# Patient Record
Sex: Male | Born: 1950 | Race: White | Hispanic: No | Smoking: Former smoker
Health system: Southern US, Community
[De-identification: ages and names within clinical notes are randomized; demographics above are authoritative.]

## PROBLEM LIST (undated history)

## (undated) DIAGNOSIS — I4892 Unspecified atrial flutter: Secondary | ICD-10-CM

## (undated) DIAGNOSIS — I10 Essential (primary) hypertension: Secondary | ICD-10-CM

## (undated) DIAGNOSIS — C801 Malignant (primary) neoplasm, unspecified: Secondary | ICD-10-CM

## (undated) DIAGNOSIS — N401 Enlarged prostate with lower urinary tract symptoms: Secondary | ICD-10-CM

## (undated) DIAGNOSIS — E7800 Pure hypercholesterolemia, unspecified: Secondary | ICD-10-CM

## (undated) DIAGNOSIS — M1990 Unspecified osteoarthritis, unspecified site: Secondary | ICD-10-CM

## (undated) DIAGNOSIS — N189 Chronic kidney disease, unspecified: Secondary | ICD-10-CM

## (undated) DIAGNOSIS — R338 Other retention of urine: Secondary | ICD-10-CM

## (undated) DIAGNOSIS — J449 Chronic obstructive pulmonary disease, unspecified: Secondary | ICD-10-CM

## (undated) DIAGNOSIS — C3491 Malignant neoplasm of unspecified part of right bronchus or lung: Secondary | ICD-10-CM

## (undated) HISTORY — PX: INCISION AND DRAINAGE ABSCESS: SHX5864

## (undated) HISTORY — PX: HERNIA REPAIR: SHX51

---

## 2014-10-14 ENCOUNTER — Other Ambulatory Visit: Payer: Self-pay | Admitting: Orthopedic Surgery

## 2014-10-20 NOTE — Pre-Procedure Instructions (Signed)
Aaron Sellers  10/20/2014      [facility] - [place], [place] - [place] [address] Phone: [phone] Fax: [phone]    Your procedure is scheduled on Friday, October 29, 2014  Report to [facility] Admitting at 9:00 A.M.  Call this number if you have problems the morning of surgery:  [phone]   Remember:  Do not eat food or drink liquids after midnight Thursday, October 28, 2014  Take these medicines the morning of surgery with A SIP OF WATER :  amLODipine (NORVASC), tamsulosin [facility]) Stop taking Aspirin, vitamins and herbal medications. Do not take any NSAIDs ie: Ibuprofen, Advil, Naproxen or any medication containing Aspirin; stop 1 week prior to procedure ( Friday, October 22, 2014)  Do not wear jewelry, make-up or nail polish.  Do not wear lotions, powders, or perfumes.  You may not wear deodorant.  Do not shave 48 hours prior to surgery.  Men may shave face and neck.  Do not bring valuables to the hospital.  [facility] is not responsible for any belongings or valuables.  Contacts, dentures or bridgework may not be worn into surgery.  Leave your suitcase in the car.  After surgery it may be brought to your room.  For patients admitted to the hospital, discharge time will be determined by your treatment team.  Patients discharged the day of surgery will not be allowed to drive home.   Name and phone number of your driver:  Special instructions:  Special Instructions:Special Instructions: [facility] - Preparing for Surgery  Before surgery, you can play an important role.  Because skin is not sterile, your skin needs to be as free of germs as possible.  You can reduce the number of germs on you skin by washing with CHG (chlorahexidine gluconate) soap before surgery.  CHG is an antiseptic cleaner which kills germs and bonds with the skin to continue killing germs even after washing.  Please DO NOT use if  you have an allergy to CHG or antibacterial soaps.  If your skin becomes reddened/irritated stop using the CHG and inform your nurse when you arrive at Short Stay.  Do not shave (including legs and underarms) for at least 48 hours prior to the first CHG shower.  You may shave your face.  Please follow these instructions carefully:   1.  Shower with CHG Soap the night before surgery and the morning of Surgery.  2.  If you choose to wash your hair, wash your hair first as usual with your normal shampoo.  3.  After you shampoo, rinse your hair and body thoroughly to remove the Shampoo.  4.  Use CHG as you would any other liquid soap.  You can apply chg directly  to the skin and wash gently with scrungie or a clean washcloth.  5.  Apply the CHG Soap to your body ONLY FROM THE NECK DOWN.  Do not use on open wounds or open sores.  Avoid contact with your eyes, ears, mouth and genitals (private parts).  Wash genitals (private parts) with your normal soap.  6.  Wash thoroughly, paying special attention to the area where your surgery will be performed.  7.  Thoroughly rinse your body with warm water from the neck down.  8.  DO NOT shower/wash with your normal soap after using and rinsing off the CHG Soap.  9.  Pat yourself dry with a clean towel.  10.  Wear clean pajamas.            11.  Place clean sheets on your bed the night of your first shower and do not sleep with pets.  Day of Surgery  Do not apply any lotions/deodorants the morning of surgery.  Please wear clean clothes to the hospital/surgery center. Please read over the following fact sheets that you were given. Pain Booklet, Coughing and Deep Breathing, Blood Transfusion Information, Total Joint Packet, MRSA Information and Surgical Site Infection Prevention

## 2014-10-21 ENCOUNTER — Encounter (HOSPITAL_COMMUNITY): Payer: Self-pay

## 2014-10-21 ENCOUNTER — Ambulatory Visit (HOSPITAL_COMMUNITY)
Admission: RE | Admit: 2014-10-21 | Discharge: 2014-10-21 | Disposition: A | Discharge status: Home / Self Care | Payer: Managed Care, Other (non HMO) | Source: Ambulatory Visit | Attending: Orthopedic Surgery | Admitting: Orthopedic Surgery

## 2014-10-21 ENCOUNTER — Encounter (HOSPITAL_COMMUNITY)
Admission: RE | Admit: 2014-10-21 | Discharge: 2014-10-21 | Disposition: A | Discharge status: Home / Self Care | Payer: Managed Care, Other (non HMO) | Source: Ambulatory Visit | Attending: Orthopedic Surgery | Admitting: Orthopedic Surgery

## 2014-10-21 DIAGNOSIS — Z01818 Encounter for other preprocedural examination: Secondary | ICD-10-CM | POA: Diagnosis present

## 2014-10-21 HISTORY — DX: Other retention of urine: R33.8

## 2014-10-21 HISTORY — DX: Unspecified osteoarthritis, unspecified site: M19.90

## 2014-10-21 HISTORY — DX: Chronic obstructive pulmonary disease, unspecified: J44.9

## 2014-10-21 HISTORY — DX: Chronic kidney disease, unspecified: N18.9

## 2014-10-21 HISTORY — DX: Pure hypercholesterolemia, unspecified: E78.00

## 2014-10-21 HISTORY — DX: Essential (primary) hypertension: I10

## 2014-10-21 HISTORY — DX: Benign prostatic hyperplasia with lower urinary tract symptoms: N40.1

## 2014-10-21 LAB — CBC WITH DIFFERENTIAL/PLATELET
Basophils Absolute: 0.1 10*3/uL (ref 0.0–0.1)
Basophils Relative: 1 % (ref 0–1)
Eosinophils Absolute: 0.2 10*3/uL (ref 0.0–0.7)
Eosinophils Relative: 1 % (ref 0–5)
HCT: 52.1 % — ABNORMAL HIGH (ref 39.0–52.0)
HEMOGLOBIN: 17.8 g/dL — AB (ref 13.0–17.0)
Lymphocytes Relative: 22 % (ref 12–46)
Lymphs Abs: 2.4 10*3/uL (ref 0.7–4.0)
MCH: 29.6 pg (ref 26.0–34.0)
MCHC: 34.2 g/dL (ref 30.0–36.0)
MCV: 86.5 fL (ref 78.0–100.0)
Monocytes Absolute: 1 10*3/uL (ref 0.1–1.0)
Monocytes Relative: 9 % (ref 3–12)
NEUTROS ABS: 7.3 10*3/uL (ref 1.7–7.7)
Neutrophils Relative %: 67 % (ref 43–77)
Platelets: 218 10*3/uL (ref 150–400)
RBC: 6.02 MIL/uL — ABNORMAL HIGH (ref 4.22–5.81)
RDW: 13.4 % (ref 11.5–15.5)
WBC: 11 10*3/uL — ABNORMAL HIGH (ref 4.0–10.5)

## 2014-10-21 LAB — COMPREHENSIVE METABOLIC PANEL
ALT: 39 U/L (ref 17–63)
ANION GAP: 9 (ref 5–15)
AST: 28 U/L (ref 15–41)
Albumin: 4.1 g/dL (ref 3.5–5.0)
Alkaline Phosphatase: 76 U/L (ref 38–126)
BUN: 11 mg/dL (ref 6–20)
CO2: 30 mmol/L (ref 22–32)
Calcium: 10 mg/dL (ref 8.9–10.3)
Chloride: 101 mmol/L (ref 101–111)
Creatinine, Ser: 1.08 mg/dL (ref 0.61–1.24)
GFR calc Af Amer: >60 mL/min (ref >60)
GFR calc non Af Amer: >60 mL/min (ref >60)
GLUCOSE: 104 mg/dL — AB (ref 65–99)
Potassium: 4.4 mmol/L (ref 3.5–5.1)
Sodium: 140 mmol/L (ref 135–145)
Total Bilirubin: 1.1 mg/dL (ref 0.3–1.2)
Total Protein: 7.6 g/dL (ref 6.5–8.1)

## 2014-10-21 LAB — URINALYSIS, ROUTINE W REFLEX MICROSCOPIC
Bilirubin Urine: NEGATIVE
Glucose, UA: NEGATIVE mg/dL
Hgb urine dipstick: NEGATIVE
KETONES UR: NEGATIVE mg/dL
Leukocytes, UA: NEGATIVE
NITRITE: NEGATIVE
PROTEIN: NEGATIVE mg/dL
Specific Gravity, Urine: 1.019 (ref 1.005–1.030)
Urobilinogen, UA: 1 mg/dL (ref 0.0–1.0)
pH: 7.5 (ref 5.0–8.0)

## 2014-10-21 LAB — APTT: aPTT: 29 seconds (ref 24–37)

## 2014-10-21 LAB — SURGICAL PCR SCREEN
MRSA, PCR: NEGATIVE
STAPHYLOCOCCUS AUREUS: NEGATIVE

## 2014-10-21 LAB — PROTIME-INR
INR: 1.01 (ref 0.00–1.49)
Prothrombin Time: 13.5 seconds (ref 11.6–15.2)

## 2014-10-21 LAB — TYPE AND SCREEN
ABO/RH(D): O POS
Antibody Screen: NEGATIVE

## 2014-10-21 LAB — ABO/RH: ABO/RH(D): O POS

## 2014-10-21 NOTE — Progress Notes (Signed)
Denies any heart problems or issues.  Has never seen cardio.  Smoker, who coughs until he gags, mainly in the AM. HOH PCP  Dr Dione Housekeeper [facility]   [address] in past 6 mths.

## 2014-10-28 MED ORDER — DEXTROSE 5 % IV SOLN
3.0000 g | INTRAVENOUS | Status: DC
  Filled 2014-10-28 (×2): qty 3000

## 2014-10-28 NOTE — Progress Notes (Signed)
Pt notified of time change.New arrival time of 0815.

## 2014-10-29 ENCOUNTER — Inpatient Hospital Stay (HOSPITAL_COMMUNITY): Payer: Managed Care, Other (non HMO) | Admitting: Certified Registered Nurse Anesthetist

## 2014-10-29 ENCOUNTER — Encounter (HOSPITAL_COMMUNITY)
Admission: RE | Disposition: A | Discharge status: Home Health | Payer: Self-pay | Source: Ambulatory Visit | Attending: Orthopedic Surgery

## 2014-10-29 ENCOUNTER — Encounter (HOSPITAL_COMMUNITY): Payer: Self-pay | Admitting: Certified Registered Nurse Anesthetist

## 2014-10-29 ENCOUNTER — Inpatient Hospital Stay (HOSPITAL_COMMUNITY)
Admission: RE | Admit: 2014-10-29 | Discharge: 2014-10-31 | DRG: 470 | Disposition: A | Discharge status: Home Health | Payer: Managed Care, Other (non HMO) | Source: Ambulatory Visit | Attending: Orthopedic Surgery | Admitting: Orthopedic Surgery

## 2014-10-29 DIAGNOSIS — M25562 Pain in left knee: Secondary | ICD-10-CM | POA: Diagnosis present

## 2014-10-29 DIAGNOSIS — M1710 Unilateral primary osteoarthritis, unspecified knee: Secondary | ICD-10-CM | POA: Diagnosis present

## 2014-10-29 DIAGNOSIS — N189 Chronic kidney disease, unspecified: Secondary | ICD-10-CM | POA: Diagnosis present

## 2014-10-29 DIAGNOSIS — M1712 Unilateral primary osteoarthritis, left knee: Principal | ICD-10-CM | POA: Diagnosis present

## 2014-10-29 DIAGNOSIS — J449 Chronic obstructive pulmonary disease, unspecified: Secondary | ICD-10-CM | POA: Diagnosis present

## 2014-10-29 DIAGNOSIS — F17210 Nicotine dependence, cigarettes, uncomplicated: Secondary | ICD-10-CM | POA: Diagnosis present

## 2014-10-29 DIAGNOSIS — Z79899 Other long term (current) drug therapy: Secondary | ICD-10-CM

## 2014-10-29 DIAGNOSIS — R338 Other retention of urine: Secondary | ICD-10-CM | POA: Diagnosis present

## 2014-10-29 DIAGNOSIS — N401 Enlarged prostate with lower urinary tract symptoms: Secondary | ICD-10-CM | POA: Diagnosis present

## 2014-10-29 DIAGNOSIS — E780 Pure hypercholesterolemia: Secondary | ICD-10-CM | POA: Diagnosis present

## 2014-10-29 DIAGNOSIS — I129 Hypertensive chronic kidney disease with stage 1 through stage 4 chronic kidney disease, or unspecified chronic kidney disease: Secondary | ICD-10-CM | POA: Diagnosis present

## 2014-10-29 DIAGNOSIS — M179 Osteoarthritis of knee, unspecified: Secondary | ICD-10-CM | POA: Diagnosis present

## 2014-10-29 HISTORY — PX: TOTAL KNEE ARTHROPLASTY: SHX125

## 2014-10-29 SURGERY — ARTHROPLASTY, KNEE, TOTAL
Anesthesia: Monitor Anesthesia Care | Site: Knee | Laterality: Left

## 2014-10-29 MED ORDER — ZOLPIDEM TARTRATE 5 MG PO TABS: 5.0000 mg | ORAL_TABLET | Freq: Every evening | ORAL | Status: DC | PRN

## 2014-10-29 MED ORDER — TRAMADOL HCL 50 MG PO TABS
50.0000 mg | ORAL_TABLET | Freq: Four times a day (QID) | ORAL | Status: DC | PRN
  Administered 2014-10-29 – 2014-10-31 (×4): 50 mg via ORAL
  Filled 2014-10-29 (×4): qty 1

## 2014-10-29 MED ORDER — METHOCARBAMOL 500 MG PO TABS
500.0000 mg | ORAL_TABLET | Freq: Four times a day (QID) | ORAL | Status: DC | PRN
  Administered 2014-10-29 – 2014-10-31 (×6): 500 mg via ORAL
  Filled 2014-10-29 (×6): qty 1

## 2014-10-29 MED ORDER — KETAMINE HCL 100 MG/ML IJ SOLN
INTRAMUSCULAR | Status: AC
  Filled 2014-10-29: qty 1

## 2014-10-29 MED ORDER — MENTHOL 3 MG MT LOZG: 1.0000 | LOZENGE | OROMUCOSAL | Status: DC | PRN

## 2014-10-29 MED ORDER — ALUM & MAG HYDROXIDE-SIMETH 200-200-20 MG/5ML PO SUSP: 30.0000 mL | ORAL | Status: DC | PRN

## 2014-10-29 MED ORDER — METOCLOPRAMIDE HCL 5 MG/ML IJ SOLN: 5.0000 mg | Freq: Three times a day (TID) | INTRAMUSCULAR | Status: DC | PRN

## 2014-10-29 MED ORDER — SODIUM CHLORIDE 0.9 % IJ SOLN
INTRAMUSCULAR | Status: AC
  Filled 2014-10-29: qty 10

## 2014-10-29 MED ORDER — ATORVASTATIN CALCIUM 10 MG PO TABS
10.0000 mg | ORAL_TABLET | Freq: Every day | ORAL | Status: DC
  Administered 2014-10-29 – 2014-10-30 (×2): 10 mg via ORAL
  Filled 2014-10-29 (×3): qty 1

## 2014-10-29 MED ORDER — CEFAZOLIN SODIUM-DEXTROSE 2-3 GM-% IV SOLR
2.0000 g | Freq: Four times a day (QID) | INTRAVENOUS | Status: AC
  Administered 2014-10-29: 2 g via INTRAVENOUS
  Filled 2014-10-29 (×3): qty 50

## 2014-10-29 MED ORDER — EPHEDRINE SULFATE 50 MG/ML IJ SOLN
INTRAMUSCULAR | Status: DC | PRN
  Administered 2014-10-29 (×3): 5 mg via INTRAVENOUS
  Administered 2014-10-29: 10 mg via INTRAVENOUS

## 2014-10-29 MED ORDER — SODIUM CHLORIDE 0.9 % IV SOLN
INTRAVENOUS | Status: DC
  Administered 2014-10-29: 75 mL/h via INTRAVENOUS

## 2014-10-29 MED ORDER — ASPIRIN EC 325 MG PO TBEC
325.0000 mg | DELAYED_RELEASE_TABLET | Freq: Two times a day (BID) | ORAL | Status: DC
  Administered 2014-10-29 – 2014-10-31 (×4): 325 mg via ORAL
  Filled 2014-10-29 (×4): qty 1

## 2014-10-29 MED ORDER — BUPIVACAINE LIPOSOME 1.3 % IJ SUSP
20.0000 mL | Freq: Once | INTRAMUSCULAR | Status: AC
  Administered 2014-10-29: 20 mL
  Filled 2014-10-29: qty 20

## 2014-10-29 MED ORDER — POLYETHYLENE GLYCOL 3350 17 G PO PACK
17.0000 g | PACK | Freq: Every day | ORAL | Status: DC | PRN
Start: 2014-10-29 — End: 2014-10-31

## 2014-10-29 MED ORDER — HYDROMORPHONE HCL 1 MG/ML IJ SOLN
1.0000 mg | INTRAMUSCULAR | Status: DC | PRN
  Administered 2014-10-30: 1 mg via INTRAVENOUS
  Filled 2014-10-29: qty 2
  Filled 2014-10-29: qty 1

## 2014-10-29 MED ORDER — ONDANSETRON HCL 4 MG PO TABS: 4.0000 mg | ORAL_TABLET | Freq: Four times a day (QID) | ORAL | Status: DC | PRN

## 2014-10-29 MED ORDER — ACETAMINOPHEN 325 MG PO TABS: 325.0000 mg | ORAL_TABLET | ORAL | Status: DC | PRN

## 2014-10-29 MED ORDER — AMLODIPINE BESYLATE 5 MG PO TABS
5.0000 mg | ORAL_TABLET | Freq: Every day | ORAL | Status: DC
  Administered 2014-10-30 – 2014-10-31 (×2): 5 mg via ORAL
  Filled 2014-10-29 (×2): qty 1

## 2014-10-29 MED ORDER — KETAMINE HCL 10 MG/ML IJ SOLN
INTRAMUSCULAR | Status: DC | PRN
  Administered 2014-10-29: 10 mg via INTRAVENOUS
  Administered 2014-10-29: 30 mg via INTRAVENOUS

## 2014-10-29 MED ORDER — BUPIVACAINE HCL (PF) 0.5 % IJ SOLN
INTRAMUSCULAR | Status: DC | PRN
  Administered 2014-10-29: 20 mL

## 2014-10-29 MED ORDER — MIDAZOLAM HCL 2 MG/2ML IJ SOLN
INTRAMUSCULAR | Status: AC
  Filled 2014-10-29: qty 2

## 2014-10-29 MED ORDER — OXYCODONE HCL 5 MG PO TABS: 5.0000 mg | ORAL_TABLET | Freq: Once | ORAL | Status: DC | PRN

## 2014-10-29 MED ORDER — TAMSULOSIN HCL 0.4 MG PO CAPS
0.4000 mg | ORAL_CAPSULE | Freq: Every day | ORAL | Status: DC
  Administered 2014-10-30: 0.4 mg via ORAL
  Filled 2014-10-29: qty 1

## 2014-10-29 MED ORDER — DIPHENHYDRAMINE HCL 12.5 MG/5ML PO ELIX: 12.5000 mg | ORAL_SOLUTION | ORAL | Status: DC | PRN

## 2014-10-29 MED ORDER — PHENOL 1.4 % MT LIQD: 1.0000 | OROMUCOSAL | Status: DC | PRN

## 2014-10-29 MED ORDER — PROPOFOL INFUSION 10 MG/ML OPTIME
INTRAVENOUS | Status: DC | PRN
  Administered 2014-10-29: 50 ug/kg/min via INTRAVENOUS

## 2014-10-29 MED ORDER — CHLORHEXIDINE GLUCONATE 4 % EX LIQD: 60.0000 mL | Freq: Once | CUTANEOUS | Status: DC

## 2014-10-29 MED ORDER — LACTATED RINGERS IV SOLN
INTRAVENOUS | Status: DC
  Administered 2014-10-29 (×2): via INTRAVENOUS

## 2014-10-29 MED ORDER — HYDROMORPHONE HCL 1 MG/ML IJ SOLN: 0.2500 mg | INTRAMUSCULAR | Status: DC | PRN

## 2014-10-29 MED ORDER — METHOCARBAMOL 1000 MG/10ML IJ SOLN
500.0000 mg | Freq: Four times a day (QID) | INTRAVENOUS | Status: DC | PRN
  Filled 2014-10-29: qty 5

## 2014-10-29 MED ORDER — MIDAZOLAM HCL 5 MG/5ML IJ SOLN
INTRAMUSCULAR | Status: DC | PRN
  Administered 2014-10-29: 2 mg via INTRAVENOUS

## 2014-10-29 MED ORDER — HYDROCHLOROTHIAZIDE 25 MG PO TABS
25.0000 mg | ORAL_TABLET | Freq: Every day | ORAL | Status: DC
  Administered 2014-10-29 – 2014-10-31 (×3): 25 mg via ORAL
  Filled 2014-10-29 (×4): qty 1

## 2014-10-29 MED ORDER — ONDANSETRON HCL 4 MG/2ML IJ SOLN: 4.0000 mg | Freq: Four times a day (QID) | INTRAMUSCULAR | Status: DC | PRN

## 2014-10-29 MED ORDER — 0.9 % SODIUM CHLORIDE (POUR BTL) OPTIME
TOPICAL | Status: DC | PRN
  Administered 2014-10-29: 1000 mL

## 2014-10-29 MED ORDER — HYDROMORPHONE HCL 2 MG PO TABS
2.0000 mg | ORAL_TABLET | ORAL | Status: DC | PRN
  Administered 2014-10-29 – 2014-10-31 (×10): 2 mg via ORAL
  Filled 2014-10-29 (×10): qty 1

## 2014-10-29 MED ORDER — ACETAMINOPHEN 650 MG RE SUPP: 650.0000 mg | Freq: Four times a day (QID) | RECTAL | Status: DC | PRN

## 2014-10-29 MED ORDER — PROPOFOL 10 MG/ML IV BOLUS
INTRAVENOUS | Status: AC
  Filled 2014-10-29: qty 20

## 2014-10-29 MED ORDER — PHENYLEPHRINE HCL 10 MG/ML IJ SOLN
INTRAMUSCULAR | Status: DC | PRN
  Administered 2014-10-29: 120 ug via INTRAVENOUS
  Administered 2014-10-29: 80 ug via INTRAVENOUS
  Administered 2014-10-29 (×2): 120 ug via INTRAVENOUS
  Administered 2014-10-29: 80 ug via INTRAVENOUS
  Administered 2014-10-29: 120 ug via INTRAVENOUS

## 2014-10-29 MED ORDER — BISACODYL 10 MG RE SUPP: 10.0000 mg | Freq: Every day | RECTAL | Status: DC | PRN

## 2014-10-29 MED ORDER — FENTANYL CITRATE (PF) 100 MCG/2ML IJ SOLN
INTRAMUSCULAR | Status: DC
Start: 2014-10-29 — End: 2014-10-29
  Filled 2014-10-29: qty 2

## 2014-10-29 MED ORDER — FAMOTIDINE 20 MG PO TABS: 10.0000 mg | ORAL_TABLET | ORAL | Status: DC | PRN

## 2014-10-29 MED ORDER — KETOROLAC TROMETHAMINE 15 MG/ML IJ SOLN
15.0000 mg | Freq: Four times a day (QID) | INTRAMUSCULAR | Status: AC
  Administered 2014-10-29 – 2014-10-30 (×4): 15 mg via INTRAVENOUS
  Filled 2014-10-29 (×4): qty 1

## 2014-10-29 MED ORDER — OXYCODONE HCL 5 MG/5ML PO SOLN: 5.0000 mg | Freq: Once | ORAL | Status: DC | PRN

## 2014-10-29 MED ORDER — ACETAMINOPHEN 325 MG PO TABS
650.0000 mg | ORAL_TABLET | Freq: Four times a day (QID) | ORAL | Status: DC | PRN
Start: 2014-10-29 — End: 2014-10-31
  Administered 2014-10-31: 650 mg via ORAL
  Filled 2014-10-29: qty 2

## 2014-10-29 MED ORDER — MAGNESIUM CITRATE PO SOLN: 1.0000 | Freq: Once | ORAL | Status: AC | PRN

## 2014-10-29 MED ORDER — SODIUM CHLORIDE 0.9 % IV SOLN
1000.0000 mg | INTRAVENOUS | Status: AC
  Administered 2014-10-29: 1000 mg via INTRAVENOUS
  Filled 2014-10-29: qty 10

## 2014-10-29 MED ORDER — DOCUSATE SODIUM 100 MG PO CAPS
100.0000 mg | ORAL_CAPSULE | Freq: Two times a day (BID) | ORAL | Status: DC
  Administered 2014-10-29 – 2014-10-31 (×4): 100 mg via ORAL
  Filled 2014-10-29 (×4): qty 1

## 2014-10-29 MED ORDER — FENTANYL CITRATE (PF) 250 MCG/5ML IJ SOLN
INTRAMUSCULAR | Status: AC
  Filled 2014-10-29: qty 5

## 2014-10-29 MED ORDER — METOCLOPRAMIDE HCL 5 MG PO TABS: 5.0000 mg | ORAL_TABLET | Freq: Three times a day (TID) | ORAL | Status: DC | PRN

## 2014-10-29 MED ORDER — ACETAMINOPHEN 160 MG/5ML PO SOLN
325.0000 mg | ORAL | Status: DC | PRN
  Filled 2014-10-29: qty 20.3

## 2014-10-29 SURGICAL SUPPLY — 62 items
BANDAGE ESMARK 6X9 LF (GAUZE/BANDAGES/DRESSINGS) ×1 IMPLANT
BENZOIN TINCTURE PRP APPL 2/3 (GAUZE/BANDAGES/DRESSINGS) ×3 IMPLANT
BLADE SAGITTAL 25.0X1.19X90 (BLADE) ×2 IMPLANT
BLADE SAGITTAL 25.0X1.19X90MM (BLADE) ×1
BLADE SAW SAG 90X13X1.27 (BLADE) ×3 IMPLANT
BNDG ESMARK 6X9 LF (GAUZE/BANDAGES/DRESSINGS) ×3
BOWL SMART MIX CTS (DISPOSABLE) ×3 IMPLANT
CAP KNEE TOTAL 3 SIGMA ×3 IMPLANT
CEMENT HV SMART SET (Cement) ×6 IMPLANT
CLOSURE WOUND 1/2 X4 (GAUZE/BANDAGES/DRESSINGS) ×2
COVER SURGICAL LIGHT HANDLE (MISCELLANEOUS) ×3 IMPLANT
CUFF TOURNIQUET SINGLE 34IN LL (TOURNIQUET CUFF) ×3 IMPLANT
CUFF TOURNIQUET SINGLE 44IN (TOURNIQUET CUFF) IMPLANT
DRAPE EXTREMITY T 121X128X90 (DRAPE) ×3 IMPLANT
DRAPE IMP U-DRAPE 54X76 (DRAPES) ×3 IMPLANT
DRAPE INCISE IOBAN 66X45 STRL (DRAPES) ×3 IMPLANT
DRAPE U-SHAPE 47X51 STRL (DRAPES) ×3 IMPLANT
DRSG MEPILEX BORDER 4X12 (GAUZE/BANDAGES/DRESSINGS) ×3 IMPLANT
DRSG MEPILEX BORDER 4X8 (GAUZE/BANDAGES/DRESSINGS) IMPLANT
DRSG PAD ABDOMINAL 8X10 ST (GAUZE/BANDAGES/DRESSINGS) ×3 IMPLANT
DURAPREP 26ML APPLICATOR (WOUND CARE) ×3 IMPLANT
ELECT REM PT RETURN 9FT ADLT (ELECTROSURGICAL) ×3
ELECTRODE REM PT RTRN 9FT ADLT (ELECTROSURGICAL) ×1 IMPLANT
EVACUATOR 1/8 PVC DRAIN (DRAIN) ×3 IMPLANT
FACESHIELD WRAPAROUND (MASK) ×3 IMPLANT
GAUZE SPONGE 4X4 12PLY STRL (GAUZE/BANDAGES/DRESSINGS) ×3 IMPLANT
GLOVE BIOGEL PI IND STRL 8 (GLOVE) ×2 IMPLANT
GLOVE BIOGEL PI INDICATOR 8 (GLOVE) ×4
GLOVE ECLIPSE 7.5 STRL STRAW (GLOVE) ×6 IMPLANT
GOWN STRL REUS W/ TWL LRG LVL3 (GOWN DISPOSABLE) ×1 IMPLANT
GOWN STRL REUS W/ TWL XL LVL3 (GOWN DISPOSABLE) ×2 IMPLANT
GOWN STRL REUS W/TWL LRG LVL3 (GOWN DISPOSABLE) ×2
GOWN STRL REUS W/TWL XL LVL3 (GOWN DISPOSABLE) ×4
HANDPIECE INTERPULSE COAX TIP (DISPOSABLE) ×2
HOOD PEEL AWAY FACE SHEILD DIS (HOOD) ×9 IMPLANT
IMMOBILIZER KNEE 20 (SOFTGOODS) IMPLANT
IMMOBILIZER KNEE 22 (SOFTGOODS) ×3 IMPLANT
IMMOBILIZER KNEE 22 UNIV (SOFTGOODS) ×3 IMPLANT
KIT BASIN OR (CUSTOM PROCEDURE TRAY) ×3 IMPLANT
KIT ROOM TURNOVER OR (KITS) ×3 IMPLANT
MANIFOLD NEPTUNE II (INSTRUMENTS) ×3 IMPLANT
NEEDLE SPNL 22GX3.5 QUINCKE BK (NEEDLE) ×3 IMPLANT
NS IRRIG 1000ML POUR BTL (IV SOLUTION) ×3 IMPLANT
PACK TOTAL JOINT (CUSTOM PROCEDURE TRAY) ×3 IMPLANT
PACK UNIVERSAL I (CUSTOM PROCEDURE TRAY) ×3 IMPLANT
PAD ARMBOARD 7.5X6 YLW CONV (MISCELLANEOUS) ×6 IMPLANT
PAD CAST 4YDX4 CTTN HI CHSV (CAST SUPPLIES) ×1 IMPLANT
PADDING CAST COTTON 4X4 STRL (CAST SUPPLIES) ×2
SET HNDPC FAN SPRY TIP SCT (DISPOSABLE) ×1 IMPLANT
STAPLER VISISTAT 35W (STAPLE) IMPLANT
STRIP CLOSURE SKIN 1/2X4 (GAUZE/BANDAGES/DRESSINGS) ×4 IMPLANT
SUCTION FRAZIER TIP 10 FR DISP (SUCTIONS) ×3 IMPLANT
SUT MNCRL AB 3-0 PS2 18 (SUTURE) IMPLANT
SUT VIC AB 0 CTB1 27 (SUTURE) ×6 IMPLANT
SUT VIC AB 1 CT1 27 (SUTURE) ×4
SUT VIC AB 1 CT1 27XBRD ANBCTR (SUTURE) ×2 IMPLANT
SUT VIC AB 2-0 CTB1 (SUTURE) ×6 IMPLANT
SYR 50ML LL SCALE MARK (SYRINGE) ×3 IMPLANT
TOWEL OR 17X24 6PK STRL BLUE (TOWEL DISPOSABLE) ×3 IMPLANT
TOWEL OR 17X26 10 PK STRL BLUE (TOWEL DISPOSABLE) ×3 IMPLANT
TRAY FOLEY CATH 16FRSI W/METER (SET/KITS/TRAYS/PACK) IMPLANT
WRAP KNEE MAXI GEL POST OP (GAUZE/BANDAGES/DRESSINGS) ×3 IMPLANT

## 2014-10-29 NOTE — Anesthesia Procedure Notes (Signed)
Spinal Patient location during procedure: OR Staffing Anesthesiologist: Travian Kerner, CHRIS Preanesthetic Checklist Completed: patient identified, surgical consent, pre-op evaluation, timeout performed, IV checked, risks and benefits discussed and monitors and equipment checked Spinal Block Patient position: sitting Prep: site prepped and draped and DuraPrep Patient monitoring: heart rate, cardiac monitor, continuous pulse ox and blood pressure Approach: midline Location: L3-4 Injection technique: single-shot Needle Needle type: Pencan  Needle gauge: 24 G Needle length: 10 cm Assessment Sensory level: T6

## 2014-10-29 NOTE — Transfer of Care (Signed)
Immediate Anesthesia Transfer of Care Note  Patient: Aaron Sellers  Procedure(s) Performed: Procedure(s): TOTAL KNEE ARTHROPLASTY (Left)  Patient Location: PACU  Anesthesia Type:MAC and Spinal  Level of Consciousness: awake, alert , oriented and patient cooperative  Airway & Oxygen Therapy: Patient Spontanous Breathing and Patient connected to nasal cannula oxygen  Post-op Assessment: Report given to RN, Post -op Vital signs reviewed and stable, Patient moving all extremities and Patient moving all extremities X 4  Post vital signs: Reviewed and stable  Last Vitals:  Filed Vitals:   10/29/14 0816  BP: 155/87  Pulse: 70  Temp: 90.3 C    Complications: No apparent anesthesia complications

## 2014-10-29 NOTE — Anesthesia Preprocedure Evaluation (Signed)
Anesthesia Evaluation  Patient identified by MRN, date of birth, ID band Patient awake    Reviewed: Allergy & Precautions, NPO status , Patient's Chart, lab work & pertinent test results  History of Anesthesia Complications Negative for: history of anesthetic complications  Airway Mallampati: II  TM Distance: >3 FB Neck ROM: Full    Dental  (+) Edentulous Upper, Edentulous Lower   Pulmonary Current Smoker,  breath sounds clear to auscultation        Cardiovascular hypertension, Pt. on medications Rhythm:Regular     Neuro/Psych negative neurological ROS  negative psych ROS   GI/Hepatic Neg liver ROS, GERD-  Medicated and Controlled,  Endo/Other  negative endocrine ROS  Renal/GU Renal InsufficiencyRenal disease     Musculoskeletal  (+) Arthritis -,   Abdominal   Peds  Hematology negative hematology ROS (+)   Anesthesia Other Findings   Reproductive/Obstetrics                             Anesthesia Physical Anesthesia Plan  ASA: II  Anesthesia Plan: MAC and Spinal   Post-op Pain Management:    Induction: Intravenous  Airway Management Planned: Nasal Cannula  Additional Equipment:   Intra-op Plan:   Post-operative Plan:   Informed Consent: I have reviewed the patients History and Physical, chart, labs and discussed the procedure including the risks, benefits and alternatives for the proposed anesthesia with the patient or authorized representative who has indicated his/her understanding and acceptance.     Plan Discussed with: CRNA and Surgeon  Anesthesia Plan Comments:         Anesthesia Quick Evaluation

## 2014-10-29 NOTE — Evaluation (Signed)
Physical Therapy Evaluation Patient Details Name: Aaron Sellers MRN: [id number] DOB: Jul 02, 1950 Today's Date: 10/29/2014   History of Present Illness  Pt is a 64 y/o M s/p L TKA.  Pt's PMH includes HTN, COPD.  Clinical Impression  Pt is s/p L TKA resulting in the deficits listed below (see PT Problem List). [name] will have 24/7 assist from his wife upon d/c to home. Pt will benefit from skilled PT to increase their independence and safety with mobility to allow discharge to the venue listed below.     Follow Up Recommendations Home health PT;Supervision/Assistance - 24 hour    Equipment Recommendations  None recommended by PT    Recommendations for Other Services OT consult     Precautions / Restrictions Precautions Precautions: Knee;Fall Precaution Booklet Issued: Yes (comment) Precaution Comments: Reviewed knee precautions Required Braces or Orthoses: Knee Immobilizer - Left Knee Immobilizer - Left: Other (comment) (not specified in order) Restrictions Weight Bearing Restrictions: Yes LLE Weight Bearing: Weight bearing as tolerated      Mobility  Bed Mobility Overal bed mobility: Needs Assistance Bed Mobility: Supine to Sit     Supine to sit: Min assist     General bed mobility comments: Min assist to manage LLE to EOB.  Increased time, cues for sequencing, and use of bed rails.  Transfers Overall transfer level: Needs assistance Equipment used: Rolling walker (2 wheeled) Transfers: Sit to/from [facility] Sit to Stand: Min assist;From elevated surface Stand pivot transfers: Min guard       General transfer comment: Min assist to power up to standing and cues for hand placement and technique.  Min guard for safety during stand pivot w/ directional cues.  Ambulation/Gait                Stairs            Wheelchair Mobility    Modified Rankin (Stroke Patients Only)       Balance Overall balance assessment: Needs  assistance Sitting-balance support: Bilateral upper extremity supported;Feet supported Sitting balance-Leahy Scale: Fair     Standing balance support: Bilateral upper extremity supported;During functional activity Standing balance-Leahy Scale: Poor Standing balance comment: Relies on RW                             Pertinent Vitals/Pain Pain Assessment: 0-10 Pain Score: 6  Pain Location: L knee Pain Descriptors / Indicators: Aching Pain Intervention(s): Limited activity within patient's tolerance;Monitored during session;Repositioned    Home Living Family/patient expects to be discharged to:: Private residence Living Arrangements: Spouse/significant other Available Help at Discharge: Family;Available 24 hours/day Type of Home: House Home Access: Level entry     Home Layout: Multi-level Home Equipment: Walker - 2 wheels;Bedside commode;Cane - single point      Prior Function Level of Independence: Independent with assistive device(s)         Comments: Using cane at all times PTA     Hand Dominance        Extremity/Trunk Assessment   Upper Extremity Assessment: Defer to OT evaluation           Lower Extremity Assessment: LLE deficits/detail   LLE Deficits / Details: weakness and limited ROM s/p L TKA     Communication      Cognition Arousal/Alertness: Awake/alert Behavior During Therapy: WFL for tasks assessed/performed Overall Cognitive Status: Within Functional Limits for tasks assessed  General Comments      Exercises Total Joint Exercises Ankle Circles/Pumps: AROM;Both;10 reps;Supine Quad Sets: AROM;Both;10 reps;Supine Heel Slides: AROM;Left;5 reps;Supine Goniometric ROM: 8-79      Assessment/Plan    PT Assessment Patient needs continued PT services  PT Diagnosis Difficulty walking;Abnormality of gait;Generalized weakness;Acute pain   PT Problem List Decreased strength;Decreased range of  motion;Decreased activity tolerance;Decreased balance;Decreased mobility;Decreased coordination;Decreased knowledge of use of DME;Decreased safety awareness;Decreased knowledge of precautions;Pain;Decreased skin integrity  PT Treatment Interventions DME instruction;Gait training;Stair training;Functional mobility training;Therapeutic activities;Therapeutic exercise;Balance training;Neuromuscular re-education;Patient/family education;Modalities   PT Goals (Current goals can be found in the Care Plan section) Acute Rehab PT Goals Patient Stated Goal: none stated PT Goal Formulation: With patient/family Time For Goal Achievement: 11/05/14 Potential to Achieve Goals: Good    Frequency 7X/week   Barriers to discharge Inaccessible home environment 3 steps in house    Co-evaluation               End of Session Equipment Utilized During Treatment: Gait belt;Left knee immobilizer Activity Tolerance: Patient limited by fatigue;Patient limited by pain Patient left: in chair;with call bell/phone within reach;with family/visitor present Nurse Communication: Mobility status;Precautions;Weight bearing status         Time: 9432-0037 PT Time Calculation (min) (ACUTE ONLY): 23 min   Charges:   PT Evaluation $Initial PT Evaluation Tier I: 1 Procedure PT Treatments $Therapeutic Exercise: 8-22 mins   PT G Codes:       Joslyn Hy PT, DPT [phone] Pager: [phone] 10/29/2014, 5:07 PM

## 2014-10-29 NOTE — Progress Notes (Signed)
Orthopedic Tech Progress Note Patient Details:  Aaron Sellers 1950/12/18 [id number]  CPM Left Knee CPM Left Knee: On Left Knee Flexion (Degrees): 60 Left Knee Extension (Degrees): 0 Additional Comments: trapeze bar patient helper Viewed order from doctor's order list  Hildred Priest 10/29/2014, 2:23 PM

## 2014-10-29 NOTE — Anesthesia Postprocedure Evaluation (Signed)
  Anesthesia Post-op Note  Patient: Aaron Sellers  Procedure(s) Performed: Procedure(s): TOTAL KNEE ARTHROPLASTY (Left)  Patient Location: PACU  Anesthesia Type:MAC and Spinal  Level of Consciousness: awake  Airway and Oxygen Therapy: Patient Spontanous Breathing  Post-op Pain: none  Post-op Assessment: Post-op Vital signs reviewed, Patient's Cardiovascular Status Stable, Respiratory Function Stable, Patent Airway, No signs of Nausea or vomiting and Pain level controlled LLE Motor Response: Purposeful movement LLE Sensation: Decreased     L Sensory Level: S3-Medial thigh R Sensory Level: S3-Medial thigh  Post-op Vital Signs: Reviewed and stable  Last Vitals:  Filed Vitals:   10/29/14 1500  BP: 110/75  Pulse: 61  Temp: 36.4 C  Resp: 17    Complications: No apparent anesthesia complications

## 2014-10-29 NOTE — Progress Notes (Signed)
Utilization review completed.  

## 2014-10-29 NOTE — Brief Op Note (Signed)
10/29/2014  1:57 PM  PATIENT:  Aaron Sellers  64 y.o. male  PRE-OPERATIVE DIAGNOSIS:  DEGENERATIVE JOINT DISEASE LEFT KNEE  POST-OPERATIVE DIAGNOSIS:  DEGENERATIVE JOINT DISEASE LEFT KNEE  PROCEDURE:  Procedure(s): TOTAL KNEE ARTHROPLASTY (Left)  SURGEON:  Surgeon(s) and Role:    * Dorna Leitz, MD - Primary  PHYSICIAN ASSISTANT:   ASSISTANTS: blair roberts PA   ANESTHESIA:   spinal  EBL:  Total I/O In: 1200 [I.V.:1200] Out: 410 [Urine:160; Blood:250]  BLOOD ADMINISTERED:none  DRAINS: (1 med) Hemovact drain(s) in the l knee with  Suction Open   LOCAL MEDICATIONS USED:  OTHER experel  SPECIMEN:  No Specimen  DISPOSITION OF SPECIMEN:  N/A  COUNTS:  YES  TOURNIQUET:   Total Tourniquet Time Documented: Thigh (Left) - 70 minutes Total: Thigh (Left) - 70 minutes   DICTATION: .Other Dictation: Dictation Number [id number]  PLAN OF CARE: Admit to inpatient   PATIENT DISPOSITION:  PACU - hemodynamically stable.   Delay start of Pharmacological VTE agent (>24hrs) due to surgical blood loss or risk of bleeding: no

## 2014-10-29 NOTE — H&P (Signed)
TOTAL KNEE ADMISSION H&P  Patient is being admitted for left total knee arthroplasty.  Subjective:  Chief Complaint:left knee pain.  HPI: Aaron Sellers, 64 y.o. male, has a history of pain and functional disability in the left knee due to arthritis and has failed non-surgical conservative treatments for greater than 12 weeks to includeNSAID's and/or analgesics, corticosteriod injections, use of assistive devices, weight reduction as appropriate and activity modification.  Onset of symptoms was gradual, starting 5 years ago with gradually worsening course since that time. The patient noted no past surgery on the left knee(s).  Patient currently rates pain in the left knee(s) at 8 out of 10 with activity. Patient has night pain, worsening of pain with activity and weight bearing, pain that interferes with activities of daily living, pain with passive range of motion, crepitus and joint swelling.  Patient has evidence of subchondral sclerosis, periarticular osteophytes, joint subluxation and joint space narrowing by imaging studies. This patient has had failure of conservative care. There is no active infection.  There are no active problems to display for this patient.  Past Medical History  Diagnosis Date  . Hypertension     dx around 2011  . COPD (chronic obstructive pulmonary disease)     has been smoking since he was 22 ish--  . High cholesterol   . BPH (benign prostatic hypertrophy) with urinary retention   . Arthritis   . Chronic kidney disease     hx of kidney stones  2011    Past Surgical History  Procedure Laterality Date  . Hernia repair      umbilical hernia  04/1171  . Incision and drainage abscess      "down the middle" of his buttocks  2015    Prescriptions prior to admission  Medication Sig Dispense Refill Last Dose  . amLODipine (NORVASC) 5 MG tablet Take 5 mg by mouth daily.   10/29/2014 at Unknown time  . atorvastatin (LIPITOR) 10 MG tablet Take 10 mg by mouth daily.    10/28/2014 at Unknown time  . famotidine (PEPCID) 10 MG tablet Take 10 mg by mouth as needed for heartburn or indigestion.   10/28/2014 at Unknown time  . hydrochlorothiazide (HYDRODIURIL) 25 MG tablet Take 25 mg by mouth daily.   10/28/2014 at Unknown time  . ibuprofen (ADVIL,MOTRIN) 200 MG tablet Take 400 mg by mouth every 6 (six) hours as needed for mild pain.   10/28/2014 at Unknown time  . tamsulosin (FLOMAX) 0.4 MG CAPS capsule Take 0.4 mg by mouth daily.   10/29/2014 at Unknown time   Allergies  Allergen Reactions  . Adhesive [Tape] Other (See Comments)    Skin peels  . Codeine Other (See Comments)    Jitters, "skin crawling"    History  Substance Use Topics  . Smoking status: Current Every Day Smoker -- 1.00 packs/day for 40 years    Types: Cigarettes  . Smokeless tobacco: Former Systems developer    Types: Snuff  . Alcohol Use: No    No family history on file.   ROS ROS: I have reviewed the patient's review of systems thoroughly and there are no positive responses as relates to the HPI.  Objective:  Physical Exam  Vital signs in last 24 hours: Temp:  [98.1 F (36.7 C)] 98.1 F (36.7 C) (07/29 0816) Pulse Rate:  [70] 70 (07/29 0816) BP: (155)/(87) 155/87 mmHg (07/29 0816) SpO2:  [98 %] 98 % (07/29 0816) Weight:  [285 lb (129.275 kg)] 285 lb (129.275  kg) (07/29 0816) Well-developed well-nourished patient in no acute distress. Alert and oriented x3 HEENT:within normal limits Cardiac: Regular rate and rhythm Pulmonary: Lungs clear to auscultation Abdomen: Soft and nontender.  Normal active bowel sounds  Musculoskeletal: (l knee:  Painful rom no instability//trace effusion limited rom 0-100  Labs: Recent Results (from the past 2160 hour(s))  Urinalysis, Routine w reflex microscopic (not at [facility])     Status: Abnormal   Collection Time: 10/21/14  9:59 AM  Result Value Ref Range   Color, Urine AMBER (A) YELLOW    Comment: BIOCHEMICALS MAY BE AFFECTED BY COLOR   APPearance CLEAR  CLEAR   Specific Gravity, Urine 1.019 1.005 - 1.030   pH 7.5 5.0 - 8.0   Glucose, UA NEGATIVE NEGATIVE mg/dL   Hgb urine dipstick NEGATIVE NEGATIVE   Bilirubin Urine NEGATIVE NEGATIVE   Ketones, ur NEGATIVE NEGATIVE mg/dL   Protein, ur NEGATIVE NEGATIVE mg/dL   Urobilinogen, UA 1.0 0.0 - 1.0 mg/dL   Nitrite NEGATIVE NEGATIVE   Leukocytes, UA NEGATIVE NEGATIVE    Comment: MICROSCOPIC NOT DONE ON URINES WITH NEGATIVE PROTEIN, BLOOD, LEUKOCYTES, NITRITE, OR GLUCOSE <1000 mg/dL.  Type and screen     Status: None   Collection Time: 10/21/14 10:00 AM  Result Value Ref Range   ABO/RH(D) O POS    Antibody Screen NEG    Sample Expiration 11/04/2014   Surgical pcr screen     Status: None   Collection Time: 10/21/14 10:00 AM  Result Value Ref Range   MRSA, PCR NEGATIVE NEGATIVE   Staphylococcus aureus NEGATIVE NEGATIVE    Comment:        The Xpert SA Assay (FDA approved for NASAL specimens in patients over 55 years of age), is one component of a comprehensive surveillance program.  Test performance has been validated by [facility] for patients greater than or equal to 45 year old. It is not intended to diagnose infection nor to guide or monitor treatment.   ABO/Rh     Status: None   Collection Time: 10/21/14 10:00 AM  Result Value Ref Range   ABO/RH(D) O POS   APTT     Status: None   Collection Time: 10/21/14 10:02 AM  Result Value Ref Range   aPTT 29 24 - 37 seconds  CBC WITH DIFFERENTIAL     Status: Abnormal   Collection Time: 10/21/14 10:02 AM  Result Value Ref Range   WBC 11.0 (H) 4.0 - 10.5 K/uL   RBC 6.02 (H) 4.22 - 5.81 MIL/uL   Hemoglobin 17.8 (H) 13.0 - 17.0 g/dL   HCT 52.1 (H) 39.0 - 52.0 %   MCV 86.5 78.0 - 100.0 fL   MCH 29.6 26.0 - 34.0 pg   MCHC 34.2 30.0 - 36.0 g/dL   RDW 13.4 11.5 - 15.5 %   Platelets 218 150 - 400 K/uL   Neutrophils Relative % 67 43 - 77 %   Neutro Abs 7.3 1.7 - 7.7 K/uL   Lymphocytes Relative 22 12 - 46 %   Lymphs Abs 2.4 0.7 - 4.0  K/uL   Monocytes Relative 9 3 - 12 %   Monocytes Absolute 1.0 0.1 - 1.0 K/uL   Eosinophils Relative 1 0 - 5 %   Eosinophils Absolute 0.2 0.0 - 0.7 K/uL   Basophils Relative 1 0 - 1 %   Basophils Absolute 0.1 0.0 - 0.1 K/uL  Comprehensive metabolic panel     Status: Abnormal   Collection Time: 10/21/14 10:02  AM  Result Value Ref Range   Sodium 140 135 - 145 mmol/L   Potassium 4.4 3.5 - 5.1 mmol/L   Chloride 101 101 - 111 mmol/L   CO2 30 22 - 32 mmol/L   Glucose, Bld 104 (H) 65 - 99 mg/dL   BUN 11 6 - 20 mg/dL   Creatinine, Ser 1.08 0.61 - 1.24 mg/dL   Calcium 10.0 8.9 - 10.3 mg/dL   Total Protein 7.6 6.5 - 8.1 g/dL   Albumin 4.1 3.5 - 5.0 g/dL   AST 28 15 - 41 U/L   ALT 39 17 - 63 U/L   Alkaline Phosphatase 76 38 - 126 U/L   Total Bilirubin 1.1 0.3 - 1.2 mg/dL   GFR calc non Af Amer >60 >60 mL/min   GFR calc Af Amer >60 >60 mL/min    Comment: (NOTE) The eGFR has been calculated using the CKD EPI equation. This calculation has not been validated in all clinical situations. eGFR's persistently <60 mL/min signify possible Chronic Kidney Disease.    Anion gap 9 5 - 15  Protime-INR     Status: None   Collection Time: 10/21/14 10:02 AM  Result Value Ref Range   Prothrombin Time 13.5 11.6 - 15.2 seconds   INR 1.01 0.00 - 1.49     Estimated body mass index is 32.09 kg/(m^2) as calculated from the following:   Height as of this encounter: $RemoveBeforeD'6\' 7"'eAfRzRAbnEwshl$  (2.007 m).   Weight as of this encounter: 285 lb (129.275 kg).   Imaging Review Plain radiographs demonstrate severe degenerative joint disease of the left knee(s). The overall alignment ismild varus. The bone quality appears to be good for age and reported activity level.  Assessment/Plan:  End stage arthritis, left knee   The patient history, physical examination, clinical judgment of the provider and imaging studies are consistent with end stage degenerative joint disease of the left knee(s) and total knee arthroplasty is deemed  medically necessary. The treatment options including medical management, injection therapy arthroscopy and arthroplasty were discussed at length. The risks and benefits of total knee arthroplasty were presented and reviewed. The risks due to aseptic loosening, infection, stiffness, patella tracking problems, thromboembolic complications and other imponderables were discussed. The patient acknowledged the explanation, agreed to proceed with the plan and consent was signed. Patient is being admitted for inpatient treatment for surgery, pain control, PT, OT, prophylactic antibiotics, VTE prophylaxis, progressive ambulation and ADL's and discharge planning. The patient is planning to be discharged home with home health services

## 2014-10-30 LAB — CBC
HCT: 42.1 % (ref 39.0–52.0)
Hemoglobin: 14.1 g/dL (ref 13.0–17.0)
MCH: 28.7 pg (ref 26.0–34.0)
MCHC: 33.5 g/dL (ref 30.0–36.0)
MCV: 85.6 fL (ref 78.0–100.0)
PLATELETS: 194 10*3/uL (ref 150–400)
RBC: 4.92 MIL/uL (ref 4.22–5.81)
RDW: 13.5 % (ref 11.5–15.5)
WBC: 14.1 10*3/uL — ABNORMAL HIGH (ref 4.0–10.5)

## 2014-10-30 LAB — BASIC METABOLIC PANEL
Anion gap: 7 (ref 5–15)
BUN: 15 mg/dL (ref 6–20)
CO2: 27 mmol/L (ref 22–32)
Calcium: 8.4 mg/dL — ABNORMAL LOW (ref 8.9–10.3)
Chloride: 100 mmol/L — ABNORMAL LOW (ref 101–111)
Creatinine, Ser: 0.9 mg/dL (ref 0.61–1.24)
GFR calc Af Amer: >60 mL/min (ref >60)
GLUCOSE: 108 mg/dL — AB (ref 65–99)
POTASSIUM: 3.3 mmol/L — AB (ref 3.5–5.1)
SODIUM: 134 mmol/L — AB (ref 135–145)

## 2014-10-30 MED ORDER — ASPIRIN 325 MG PO TBEC: 325.0000 mg | DELAYED_RELEASE_TABLET | Freq: Two times a day (BID) | ORAL | Status: DC

## 2014-10-30 MED ORDER — METHOCARBAMOL 500 MG PO TABS: 500.0000 mg | ORAL_TABLET | Freq: Four times a day (QID) | ORAL | Status: DC | PRN

## 2014-10-30 MED ORDER — HYDROMORPHONE HCL 2 MG PO TABS: 2.0000 mg | ORAL_TABLET | ORAL | Status: DC | PRN

## 2014-10-30 MED ORDER — METOCLOPRAMIDE HCL 5 MG PO TABS: 5.0000 mg | ORAL_TABLET | Freq: Three times a day (TID) | ORAL | Status: DC | PRN

## 2014-10-30 NOTE — Progress Notes (Signed)
Patient continues to experience pain. PT stated would benefit review therapy and work on stairs tomorrow b/f discharge home. Put in call to on call to inquire regarding d/c Sunday 10/31/14, not today, 10/30/14.

## 2014-10-30 NOTE — Evaluation (Signed)
Occupational Therapy Evaluation Patient Details Name: Aaron Sellers MRN: [id number] DOB: 09/27/1950 Today's Date: 10/30/2014    History of Present Illness Pt is a 64 y.o. M s/p L TKA.  Pt's PMH includes HTN, COPD.   Clinical Impression   Pt s/p above. Pt independent with ADLs, PTA. Feel pt will benefit from acute OT to increase independence prior to d/c. Plan to practice shower transfer next session.    Follow Up Recommendations  No OT follow up;Supervision - Intermittent    Equipment Recommendations  None recommended by OT    Recommendations for Other Services       Precautions / Restrictions Precautions Precautions: Knee;Fall Precaution Booklet Issued: No Precaution Comments: Reviewed knee precautions Required Braces or Orthoses: Knee Immobilizer - Left Restrictions Weight Bearing Restrictions: Yes LLE Weight Bearing: Weight bearing as tolerated      Mobility Bed Mobility Overal bed mobility: Needs Assistance Bed Mobility: Supine to Sit     Supine to sit: Supervision        Transfers Overall transfer level: Needs assistance Equipment used: Rolling walker (2 wheeled) Transfers: Sit to/from Stand Sit to Stand: Min assist         General transfer comment: cues for hand placement/technique.     Balance  Min guard for ambulation with RW. Used walker to stand.                                           ADL Overall ADL's : Needs assistance/impaired                     Lower Body Dressing: Minimal assistance;Sit to/from stand   Toilet Transfer: Min guard;RW;Ambulation;Minimal assistance (Min assist for sit to stand transfer from bed)           Functional mobility during ADLs: Min guard;Rolling walker General ADL Comments: Educated on LB dressing technique. Educated on safety such as use of bag on walker and sitting to get clothing over feet. Explained purpose of knee immobilizer.  Explained benefit of reaching down to manage  left sock as it allows knee to bend. Explained AE is available to assist.     Vision     Perception     Praxis      Pertinent Vitals/Pain Pain Assessment: 0-10 Pain Score: 5  Pain Location: left knee Pain Descriptors / Indicators: Sore;Aching;Throbbing Pain Intervention(s): Repositioned;Monitored during session;Limited activity within patient's tolerance     Hand Dominance     Extremity/Trunk Assessment Upper Extremity Assessment Upper Extremity Assessment: Overall WFL for tasks assessed   Lower Extremity Assessment Lower Extremity Assessment: Defer to PT evaluation       Communication Communication Communication: HOH   Cognition Arousal/Alertness: Awake/alert Behavior During Therapy: WFL for tasks assessed/performed Overall Cognitive Status: Within Functional Limits for tasks assessed                     General Comments       Exercises       Shoulder Instructions      Home Living Family/patient expects to be discharged to:: Private residence Living Arrangements: Spouse/significant other Available Help at Discharge: Family;Available 24 hours/day;Friend(s) Type of Home: House Home Access: Level entry     Home Layout: Multi-level Alternate Level Stairs-Number of Steps: 3 Alternate Level Stairs-Rails: Can reach both Bathroom Shower/Tub: Tub/shower unit;Walk-in shower Shower/tub characteristics: Curtain (on  walk-in shower) Bathroom Toilet: Handicapped height     Home Equipment: Environmental consultant - 2 wheels;Bedside commode;Cane - single point;Adaptive equipment;Shower seat - built in;Grab bars - tub/shower;Hand held Architectural technologist: Reacher;Sock aid        Prior Functioning/Environment Level of Independence: Independent with assistive device(s)        Comments: Using cane at all times PTA    OT Diagnosis: Acute pain   OT Problem List: Decreased strength;Decreased activity tolerance;Decreased range of motion;Decreased knowledge of use  of DME or AE;Decreased knowledge of precautions;Pain   OT Treatment/Interventions: Self-care/ADL training;Therapeutic activities;Patient/family education;Balance training;DME and/or AE instruction    OT Goals(Current goals can be found in the care plan section) Acute Rehab OT Goals Patient Stated Goal: not stated OT Goal Formulation: With patient Time For Goal Achievement: 11/06/14 Potential to Achieve Goals: Good ADL Goals Pt Will Perform Lower Body Dressing: with set-up;sit to/from stand;with supervision Pt Will Transfer to Toilet: ambulating;with supervision (elevated toilet) Pt Will Perform Tub/Shower Transfer: Shower transfer;with supervision;ambulating;shower seat;rolling walker  OT Frequency: Min 2X/week   Barriers to D/C:            Co-evaluation              End of Session Equipment Utilized During Treatment: Gait belt;Rolling walker;Left knee immobilizer CPM Left Knee CPM Left Knee: Off Nurse Communication: Other (comment) (pt in chair)  Activity Tolerance: Patient tolerated treatment well Patient left: in chair;with call bell/phone within reach   Time: 3953-2023 OT Time Calculation (min): 19 min Charges:  OT General Charges $OT Visit: 1 Procedure OT Evaluation $Initial OT Evaluation Tier I: 1 Procedure G-CodesBenito Mccreedy OTR/L [id number] 10/30/2014, 9:02 AM

## 2014-10-30 NOTE — Progress Notes (Signed)
    Patient doing well, PO Day 1 S/P L TKR per Dr. Berenice Primas. Reports well controlled px, mild difficulty sleeping, using CPM on L and SCD on contralateral side. Has eaten but has not yet been to bathroom or up with PT. Eager to proceed home and sleep in own bed tonight.   Physical Exam: Filed Vitals:   10/30/14 0354  BP: 119/66  Pulse: 68  Temp: 98.5 F (36.9 C)  Resp: 17   CBC Latest Ref Rng 10/30/2014 10/21/2014  WBC 4.0 - 10.5 K/uL 14.1(H) 11.0(H)  Hemoglobin 13.0 - 17.0 g/dL 14.1 17.8(H)  Hematocrit 39.0 - 52.0 % 42.1 52.1(H)  Platelets 150 - 400 K/uL 194 218   CMP Latest Ref Rng 10/30/2014 10/21/2014  Glucose 65 - 99 mg/dL 108(H) 104(H)  BUN 6 - 20 mg/dL 15 11  Creatinine 0.61 - 1.24 mg/dL 0.90 1.08  Sodium 135 - 145 mmol/L 134(L) 140  Potassium 3.5 - 5.1 mmol/L 3.3(L) 4.4  Chloride 101 - 111 mmol/L 100(L) 101  CO2 22 - 32 mmol/L 27 30  Calcium 8.9 - 10.3 mg/dL 8.4(L) 10.0  Total Protein 6.5 - 8.1 g/dL - 7.6  Total Bilirubin 0.3 - 1.2 mg/dL - 1.1  Alkaline Phos 38 - 126 U/L - 76  AST 15 - 41 U/L - 28  ALT 17 - 63 U/L - 39   Dressing in place, knee in CPM but device not currently on. Drai in place, total of 500cc since sx, pulled without difficulty. Distal compartments soft 2+ DPP cap refill <2sec NVI  POD #1 s/p L TKR per Dr Berenice Primas, doing well  - up with PT/OT, encourage ambulation  -Cont CPM, has home unit  -HH ordered - Dilaudid for pain, robaxin for muscle spasms  -scripts in chart signed -ASA 325 BID DVT prophylaxis - likely d/c home today after PT

## 2014-10-30 NOTE — Progress Notes (Signed)
Physical Therapy Treatment Patient Details Name: Aaron Sellers MRN: [id number] DOB: January 23, 1951 Today's Date: 10/30/2014    History of Present Illness Pt is a 64 y/o M s/p L TKA.  Pt's PMH includes HTN, COPD.    PT Comments    Patient is making good progress with PT.  From a mobility standpoint anticipate patient will be ready for DC home tomorrow.  Pt will benefit from continued skilled PT services to increase functional independence and safety.     Follow Up Recommendations  Home health PT;Supervision/Assistance - 24 hour     Equipment Recommendations  None recommended by PT    Recommendations for Other Services       Precautions / Restrictions Precautions Precautions: Knee;Fall Precaution Comments: Reviewed knee precautions Required Braces or Orthoses: Knee Immobilizer - Left Knee Immobilizer - Left: Other (comment) (not specified in order) Restrictions Weight Bearing Restrictions: Yes LLE Weight Bearing: Weight bearing as tolerated    Mobility  Bed Mobility               General bed mobility comments: in recliner  Transfers Overall transfer level: Needs assistance Equipment used: Rolling walker (2 wheeled) Transfers: Sit to/from Stand Sit to Stand: Supervision         General transfer comment: Cues to push w/ BUEs from armrest rather than one hand on armrest and one on RW.  This provided the pt more stability and allowed him to reach the supervision level of assist.    Ambulation/Gait Ambulation/Gait assistance: Supervision Ambulation Distance (Feet): 150 Feet Assistive device: Rolling walker (2 wheeled) Gait Pattern/deviations: Step-through pattern;Step-to pattern;Antalgic;Decreased weight shift to left;Decreased stride length;Decreased stance time - left   Gait velocity interpretation: Below normal speed for age/gender General Gait Details: Pt able to relax shoulders and stand upright w/o cues.  1 standing rest break 2/2 fatigue.   Stairs Stairs:  Yes Stairs assistance: Min guard Stair Management: Two rails;Forwards;Step to pattern Number of Stairs: 2 (x2) General stair comments: Demonstration and VCs for technique.  No VCs necessary during second attempt.    Wheelchair Mobility    Modified Rankin (Stroke Patients Only)       Balance Overall balance assessment: Needs assistance Sitting-balance support: No upper extremity supported;Feet supported Sitting balance-Leahy Scale: Good     Standing balance support: Bilateral upper extremity supported;During functional activity Standing balance-Leahy Scale: Fair Standing balance comment: Pt able to reach to tray while standing w/ one UE supported on RW.                    Cognition Arousal/Alertness: Awake/alert Behavior During Therapy: WFL for tasks assessed/performed Overall Cognitive Status: Within Functional Limits for tasks assessed                      Exercises Total Joint Exercises Ankle Circles/Pumps: AROM;Both;10 reps;Seated Quad Sets: AROM;Both;10 reps;Seated Hip ABduction/ADduction: AROM;Left;Seated;10 reps Straight Leg Raises: AAROM;Left;5 reps;Seated Knee Flexion: AROM;Left;10 reps;Seated Goniometric ROM: 5-91    General Comments        Pertinent Vitals/Pain Pain Assessment: 0-10 Pain Score: 5  Pain Location: L knee Pain Descriptors / Indicators: Aching;Discomfort;Grimacing Pain Intervention(s): Limited activity within patient's tolerance;Monitored during session;Repositioned    Home Living                      Prior Function            PT Goals (current goals can now be found in the care plan section)  Acute Rehab PT Goals Patient Stated Goal: none stated PT Goal Formulation: With patient/family Time For Goal Achievement: 11/05/14 Potential to Achieve Goals: Good Progress towards PT goals: Progressing toward goals    Frequency  7X/week    PT Plan Current plan remains appropriate    Co-evaluation              End of Session Equipment Utilized During Treatment: Gait belt;Left knee immobilizer Activity Tolerance: Patient limited by fatigue Patient left: in chair;with call bell/phone within reach     Time: 1241-1313 PT Time Calculation (min) (ACUTE ONLY): 32 min  Charges:  $Gait Training: 23-37 mins                    G Codes:      Joslyn Hy PT, DPT [phone] Pager: [phone] 10/30/2014, 2:07 PM

## 2014-10-30 NOTE — Op Note (Signed)
NAMEMACARTHUR, LORUSSO                ACCOUNT NO.:  [id number]  MEDICAL RECORD NO.:  [id number]  LOCATION:  [id number]                        FACILITY:  [facility]  PHYSICIAN:  Alta Corning, M.D.   DATE OF BIRTH:  20-Mar-1951  DATE OF PROCEDURE:  10/29/2014 DATE OF DISCHARGE:                              OPERATIVE REPORT   PREOPERATIVE DIAGNOSIS:  End-stage degenerative joint disease, left knee.  POSTOPERATIVE DIAGNOSIS:  End-stage degenerative joint disease, left knee.  PROCEDURE:  Left total knee replacement with a Sigma system, size 6 femur, size 6 tibia, 12.5-mm bridging bearing, and a 41-mm all- polyethylene patella.  SURGEON:  Alta Corning, M.D.  ASSISTANT:  Nehemiah Massed PA-C.  ANESTHESIA:  Spinal with Exparel local.  BRIEF HISTORY:  Mr. Kolakowski is a 64 year old male with a long history of significant complaints of left knee pain.  He had been treated conservatively for prolonged period of time and after failure of all conservative care including injection therapy, viscous supplementation, activity modification, and use of a cane, he was taken to operating room for left total knee replacement.  The patient had x-ray showing severe bone-on-bone changes, and he was having significant night pain and light activity pain.  DESCRIPTION OF PROCEDURE:  The patient was taken to the operating room. After adequate anesthesia was obtained with spinal anesthetic, the patient was placed supine on the operating table.  Left leg was prepped and draped in sterile fashion.  Following this, the leg was exsanguinated.  Blood pressure tourniquet was inflated to 300 mmHg. Following this, a midline incision was made and subcutaneous tissue down to the level of the extensor mechanism and a medial parapatellar arthrotomy was undertaken.  Once this was done, the retropatellar fat pad was removed, synovium in the anterior aspect of the femur, mediolateral meniscus, and anterior and posterior cruciates.   Once this was done, the knee was exposed and intramedullary pilot hole was drilled in the femur.  A 4-degree valgus long alignment was taken with 11 mm of distal bone.  Attention was then turned towards sizing the femur, sized to a 6.  Anterior and posterior cuts were made with chamfers and box. Attention was then turned to the tibia, first it was cut perpendicular to the long axis, and then it was sized to a 6, it was drilled and keeled.  Attention was then turned towards the trial spacer, put a 12.5 in and fit nicely and got easy full extension.  Osteophytes removed from the back and all around the femoral and tibial sides with significant osteophytes in all these areas.  Once this was done, attention was turned to the patella, it was cut down a level of 13 mm.  Lugs were drilled for 41 paddle patella and Ace patella was placed.  Knee was put through a range of motion.  Some tendency towards the lateral subluxation of patella, had some towel clips out and tried that, it had actually seemed pretty nice, and so at that point we felt pretty reasonable with that.  There was a little bit of a couple of tight bands along the lateral retinaculum which was released.  We did not do a full lateral  retinacular release.  At this point, attention was turned back to the knee where the trial components were removed.  The knee was thoroughly irrigated with pulsatile lavage irrigation and suctioned dry. Following this, the final components were cemented into place.  Size 6 femur, size 6 tibia, 12.5 mm bridging bearing, and a 41 mm patella was cemented into place and held with a clamp.  Once this was done, the knee was infiltrated with 40 mL of Exparel mixed with Marcaine and the cement was allowed to completely  harden.  Once done, the tourniquet was let down, all bleeding was controlled with electrocautery.  The medium Hemovac drain was placed and the medial parapatellar arthrotomy was closed with 1  Vicryl running.  Skin was closed with 0 and 2-0 Vicryl and Monocryl subcuticular.  Benzoin and Steri-Strips were applied.  Sterile compressive dressing was applied.  The patient was taken to the recovery room and was noted to be in satisfactory condition.  Estimated blood loss for the procedure was minimal.     Alta Corning, M.D.     Corliss Skains  D:  10/29/2014  T:  10/30/2014  Job:  [id number]

## 2014-10-30 NOTE — Progress Notes (Signed)
Physical Therapy Treatment Patient Details Name: Aaron Sellers MRN: [id number] DOB: [birth year] Today's Date: 10/30/2014    History of Present Illness Pt is a 64 y/o M s/p L TKA.  Pt's PMH includes HTN, COPD.    PT Comments    Pt ambulated 90 ft in hallway w/ min guard assist and completed therapeutic exercises in sitting.  Pt will benefit from continued skilled PT services to increase functional independence and safety.  Follow Up Recommendations  Home health PT;Supervision/Assistance - 24 hour     Equipment Recommendations  None recommended by PT    Recommendations for Other Services       Precautions / Restrictions Precautions Precautions: Knee;Fall Precaution Booklet Issued: No Precaution Comments: Reviewed knee precautions Required Braces or Orthoses: Knee Immobilizer - Left Knee Immobilizer - Left: Other (comment) (not specified in order) Restrictions Weight Bearing Restrictions: Yes LLE Weight Bearing: Weight bearing as tolerated    Mobility  Bed Mobility Overal bed mobility: Needs Assistance Bed Mobility: Supine to Sit     Supine to sit: Supervision     General bed mobility comments: in recliner  Transfers Overall transfer level: Needs assistance Equipment used: Rolling walker (2 wheeled) Transfers: Sit to/from Stand Sit to Stand: Min guard         General transfer comment: Min guard for safety.  Cues to reach back to armrests and control descent to sit down.   Ambulation/Gait Ambulation/Gait assistance: Min guard Ambulation Distance (Feet): 90 Feet Assistive device: Rolling walker (2 wheeled) Gait Pattern/deviations: Step-to pattern;Antalgic;Trunk flexed;Decreased weight shift to left;Decreased stride length   Gait velocity interpretation: Below normal speed for age/gender General Gait Details: Heavy WB through BUEs which improved w/ cues to relax shoulders and stand upright.  2 standing rest breaks 2/2 pain in L knee.   Stairs             Wheelchair Mobility    Modified Rankin (Stroke Patients Only)       Balance Overall balance assessment: Needs assistance Sitting-balance support: No upper extremity supported;Feet supported Sitting balance-Leahy Scale: Good     Standing balance support: Bilateral upper extremity supported;During functional activity Standing balance-Leahy Scale: Poor Standing balance comment: relies on RW                     Cognition Arousal/Alertness: Awake/alert Behavior During Therapy: WFL for tasks assessed/performed Overall Cognitive Status: Within Functional Limits for tasks assessed                      Exercises Total Joint Exercises Ankle Circles/Pumps: AROM;Both;10 reps;Seated Quad Sets: AROM;Both;10 reps;Seated Straight Leg Raises: AAROM;Left;5 reps;Seated Knee Flexion: AROM;Left;10 reps;Seated Goniometric ROM: 5-91    General Comments        Pertinent Vitals/Pain Pain Assessment: 0-10 Pain Score: 9  Pain Location: L knee Pain Descriptors / Indicators: Aching;Throbbing Pain Intervention(s): Limited activity within patient's tolerance;Monitored during session;Repositioned;Patient requesting pain meds-RN notified    Home Living Family/patient expects to be discharged to:: Private residence Living Arrangements: Spouse/significant other Available Help at Discharge: Family;Available 24 hours/day;Friend(s) Type of Home: House Home Access: Level entry   Home Layout: Multi-level Home Equipment: Walker - 2 wheels;Bedside commode;Cane - single point;Adaptive equipment;Shower seat - built in;Grab bars - tub/shower;Hand held shower head      Prior Function Level of Independence: Independent with assistive device(s)      Comments: Using cane at all times PTA   PT Goals (current goals can now be found in  the care plan section) Acute Rehab PT Goals Patient Stated Goal: to have his pain decrease PT Goal Formulation: With patient/family Time For Goal  Achievement: 11/05/14 Potential to Achieve Goals: Good Progress towards PT goals: Progressing toward goals    Frequency  7X/week    PT Plan Current plan remains appropriate    Co-evaluation             End of Session Equipment Utilized During Treatment: Gait belt;Left knee immobilizer Activity Tolerance: Patient limited by fatigue;Patient limited by pain Patient left: in chair;with call bell/phone within reach     Time: 2419-9144 PT Time Calculation (min) (ACUTE ONLY): 22 min  Charges:  $Gait Training: 8-22 mins                    G Codes:      Joslyn Hy PT, DPT [phone] Pager: [phone] 10/30/2014, 10:46 AM

## 2014-10-31 LAB — CBC
HCT: 41.1 % (ref 39.0–52.0)
HEMOGLOBIN: 14.1 g/dL (ref 13.0–17.0)
MCH: 29.3 pg (ref 26.0–34.0)
MCHC: 34.3 g/dL (ref 30.0–36.0)
MCV: 85.3 fL (ref 78.0–100.0)
PLATELETS: 179 10*3/uL (ref 150–400)
RBC: 4.82 MIL/uL (ref 4.22–5.81)
RDW: 13.4 % (ref 11.5–15.5)
WBC: 14.2 10*3/uL — ABNORMAL HIGH (ref 4.0–10.5)

## 2014-10-31 LAB — BASIC METABOLIC PANEL
Anion gap: 8 (ref 5–15)
BUN: 12 mg/dL (ref 6–20)
CALCIUM: 8.4 mg/dL — AB (ref 8.9–10.3)
CHLORIDE: 98 mmol/L — AB (ref 101–111)
CO2: 26 mmol/L (ref 22–32)
Creatinine, Ser: 0.74 mg/dL (ref 0.61–1.24)
GFR calc Af Amer: >60 mL/min (ref >60)
Glucose, Bld: 124 mg/dL — ABNORMAL HIGH (ref 65–99)
POTASSIUM: 3.4 mmol/L — AB (ref 3.5–5.1)
Sodium: 132 mmol/L — ABNORMAL LOW (ref 135–145)

## 2014-10-31 NOTE — Progress Notes (Signed)
      Patient doing well, PO Day 2 S/P L TKR per Dr. Berenice Primas. Reports well controlled px, using CPM on L and SCD on contralateral side. Has eaten and been up to bathroom and up with PT. Eager to proceed home and sleep in own bed tonight.  Physical Exam: Filed Vitals:   10/31/14 0453  BP: 141/74  Pulse: 82  Temp: 98.4 F (36.9 C)  Resp: 15    CBC Latest Ref Rng 10/31/2014 10/30/2014 10/21/2014  WBC 4.0 - 10.5 K/uL 14.2(H) 14.1(H) 11.0(H)  Hemoglobin 13.0 - 17.0 g/dL 14.1 14.1 17.8(H)  Hematocrit 39.0 - 52.0 % 41.1 42.1 52.1(H)  Platelets 150 - 400 K/uL 179 194 218    CMP Latest Ref Rng 10/31/2014 10/30/2014 10/21/2014  Glucose 65 - 99 mg/dL 124(H) 108(H) 104(H)  BUN 6 - 20 mg/dL '12 15 11  '$ Creatinine 0.61 - 1.24 mg/dL 0.74 0.90 1.08  Sodium 135 - 145 mmol/L 132(L) 134(L) 140  Potassium 3.5 - 5.1 mmol/L 3.4(L) 3.3(L) 4.4  Chloride 101 - 111 mmol/L 98(L) 100(L) 101  CO2 22 - 32 mmol/L '26 27 30  '$ Calcium 8.9 - 10.3 mg/dL 8.4(L) 8.4(L) 10.0  Total Protein 6.5 - 8.1 g/dL - - 7.6  Total Bilirubin 0.3 - 1.2 mg/dL - - 1.1  Alkaline Phos 38 - 126 U/L - - 76  AST 15 - 41 U/L - - 28  ALT 17 - 63 U/L - - 39    Intake/Output Summary (Last 24 hours) at 10/31/14 1033 Last data filed at 10/31/14 0900  Gross per 24 hour  Intake    240 ml  Output   2050 ml  Net  -1810 ml    Dressing in place, knee in CPM but device not currently on. Drai in place, total of 500cc since sx, pulled without difficulty. Distal compartments soft 2+ DPP cap refill <2sec NVI  POD #1 s/p L TKR per Dr Berenice Primas, doing well  - up with PT/OT, encourage ambulation -Cont CPM, has home unit -HH ordered - Dilaudid for pain, robaxin for muscle spasms -scripts in chart signed -ASA 325 BID DVT prophylaxis - likely d/c home today after PT

## 2014-10-31 NOTE — Progress Notes (Signed)
Physical Therapy Treatment Patient Details Name: Aaron Sellers MRN: [id number] DOB: [birth year] Today's Date: 10/31/2014    History of Present Illness Pt is a 64 y/o M s/p L TKA.  Pt's PMH includes HTN, COPD.    PT Comments    Pt is making progress w/ his gait mechanics and reported he felt his pain was more under control this session.  Supervision for sit<>stand and ambulation using RW.  Pt will benefit from continued skilled PT services to increase functional independence and safety.   Follow Up Recommendations  Home health PT;Supervision/Assistance - 24 hour     Equipment Recommendations  None recommended by PT    Recommendations for Other Services       Precautions / Restrictions Precautions Precautions: Knee;Fall Precaution Booklet Issued: No Precaution Comments: Reviewed knee precautions Required Braces or Orthoses: Knee Immobilizer - Left Knee Immobilizer - Left: Other (comment) (not specified in order, but benefits for stance stability) Restrictions Weight Bearing Restrictions: Yes LLE Weight Bearing: Weight bearing as tolerated    Mobility  Bed Mobility               General bed mobility comments: In recliner  Transfers Overall transfer level: Needs assistance Equipment used: Rolling walker (2 wheeled) Transfers: Sit to/from Stand Sit to Stand: Supervision         General transfer comment: Supervision for safety.  Good technique standing from recliner chair and sitting down to recliner chair.  Ambulation/Gait Ambulation/Gait assistance: Supervision Ambulation Distance (Feet): 125 Feet Assistive device: Rolling walker (2 wheeled) Gait Pattern/deviations: Step-through pattern;Step-to pattern;Antalgic;Decreased weight shift to left;Decreased stride length;Decreased stance time - left   Gait velocity interpretation: Below normal speed for age/gender General Gait Details: Demonstration and VCs for heel strike and toe off, which allowed pt to progress  to step through gait pattern.  Dec L hip extension.  Pt continues to rely on RW during L stance phase.   Stairs         General stair comments: Pt again opted not to go over stairs; he is confident in his ability to manage them, and had practiced them yesterday.  Wheelchair Mobility    Modified Rankin (Stroke Patients Only)       Balance Overall balance assessment: Needs assistance Sitting-balance support: No upper extremity supported;Feet supported Sitting balance-Leahy Scale: Good     Standing balance support: No upper extremity supported;During functional activity Standing balance-Leahy Scale: Fair Standing balance comment: Pt able to adjust gown in standing w/o either UE supported                    Cognition Arousal/Alertness: Awake/alert Behavior During Therapy: WFL for tasks assessed/performed Overall Cognitive Status: Within Functional Limits for tasks assessed                      Exercises Total Joint Exercises Ankle Circles/Pumps: AROM;Both;10 reps;Seated Quad Sets: AROM;Both;5 reps;Seated Short Arc Quad: AAROM;Left;5 reps;Seated Knee Flexion: AROM;Left;5 reps;Seated;Limitations Knee Flexion Limitations: weight shift to R pelvis, moving L hip posteriorly Goniometric ROM: 4-67  Standing Hip Extension: AROM;Left;10 reps;Standing    General Comments        Pertinent Vitals/Pain Pain Assessment: 0-10 Pain Score: 5  Pain Location: L knee Pain Descriptors / Indicators: Sore Pain Intervention(s): Limited activity within patient's tolerance;Monitored during session;Repositioned;Ice applied    Home Living                      Prior Function  PT Goals (current goals can now be found in the care plan section) Acute Rehab PT Goals 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 PT Goal Formulation: With patient/family Time For Goal Achievement: 11/05/14 Potential to Achieve Goals: Good Progress towards  PT goals: Progressing toward goals    Frequency  7X/week    PT Plan Current plan remains appropriate    Co-evaluation             End of Session Equipment Utilized During Treatment: Gait belt;Left knee immobilizer Activity Tolerance: Patient tolerated treatment well Patient left: in chair;with call bell/phone within reach;with family/visitor present     Time: 0029-8473 PT Time Calculation (min) (ACUTE ONLY): 26 min  Charges:  $Gait Training: 8-22 mins $Therapeutic Exercise: 8-22 mins                    G Codes:      Joslyn Hy PT, DPT [phone] Pager: [phone] 10/31/2014, 12:37 PM

## 2014-10-31 NOTE — Progress Notes (Signed)
Physical Therapy Treatment Patient Details Name: Aaron Sellers MRN: [id number] DOB: [birth year] Today's Date: 10/31/2014    History of Present Illness Pt is a 64 y/o M s/p L TKA.  Pt's PMH includes HTN, COPD.    PT Comments    Still, painful LLE but willing and able to participate; Will have necessary level of assist at home; Stair training done yesterday; OK for dc home from PT standpoint  Follow Up Recommendations  Home health PT;Supervision/Assistance - 24 hour     Equipment Recommendations  None recommended by PT    Recommendations for Other Services OT consult     Precautions / Restrictions Precautions Precautions: Knee;Fall Pt educated to not allow any pillow or bolster under knee for healing with optimal range of motion.  Required Braces or Orthoses: Knee Immobilizer - Left Knee Immobilizer - Left: Other (comment) (not specified in order, but benefits for stance stability) Restrictions LLE Weight Bearing: Weight bearing as tolerated    Mobility  Bed Mobility Overal bed mobility: Needs Assistance Bed Mobility: Supine to Sit;Sit to Supine     Supine to sit: Min assist Sit to supine: Min assist   General bed mobility comments: Min assist to suport LLE coming off and going back onto bed  Transfers Overall transfer level: Needs assistance Equipment used: Rolling walker (2 wheeled) Transfers: Sit to/from Stand Sit to Stand: Min guard         General transfer comment: Continued cues for hand placement and safety; noted more difficulty with rise from 3in1 (placed over toilet), but good use of grab bar  Ambulation/Gait Ambulation/Gait assistance: Min guard;Supervision Ambulation Distance (Feet): 150 Feet Assistive device: Rolling walker (2 wheeled) Gait Pattern/deviations: Step-to pattern;Step-through pattern;Decreased stance time - left;Antalgic (Step-through pattern emerging)   Gait velocity interpretation: Below normal speed for age/gender General Gait  Details: Cues for gait sequence and to push through UEs during L stance to help with pain; did not need a standing rest break this walk   Stairs         General stair comments: Pt opted not to go over stairs; he is confident in his ability to manage them, and had practiced them last session  Wheelchair Mobility    Modified Rankin (Stroke Patients Only)       Balance             Standing balance-Leahy Scale: Fair                      Cognition Arousal/Alertness: Awake/alert Behavior During Therapy: WFL for tasks assessed/performed Overall Cognitive Status: Within Functional Limits for tasks assessed                      Exercises Total Joint Exercises Ankle Circles/Pumps: AROM;Both;10 reps Quad Sets: AROM;Left;15 reps Short Arc QuadSinclair Ship;Left;10 reps Heel Slides: AAROM;Left;10 reps Straight Leg Raises: AAROM;Left;10 reps Long Arc Quad: AAROM;Left;5 reps Goniometric ROM: 2-60 deg, limited by paina nd muscle guarding LLE    General Comments        Pertinent Vitals/Pain Pain Assessment: 0-10 Pain Score: 5  Pain Location: L knee with amb; incr to 9/10 with overpressure into knee flexion, but subsides quickly Pain Descriptors / Indicators: Aching;Grimacing;Guarding Pain Intervention(s): Limited activity within patient's tolerance;Monitored during session;Patient requesting pain meds-RN notified    Home Living                      Prior Function  PT Goals (current goals can now be found in the care plan section) Acute Rehab PT Goals 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 PT Goal Formulation: With patient/family Time For Goal Achievement: 11/05/14 Potential to Achieve Goals: Good Progress towards PT goals: Progressing toward goals (though more painful today)    Frequency  7X/week    PT Plan Current plan remains appropriate    Co-evaluation             End of Session Equipment Utilized  During Treatment: Gait belt;Left knee immobilizer Activity Tolerance: Patient tolerated treatment well Patient left: in chair;with call bell/phone within reach     Time: 0742-0821 (minus 4 minutes on teh commode) PT Time Calculation (min) (ACUTE ONLY): 39 min  Charges:  $Gait Training: 8-22 mins $Therapeutic Exercise: 8-22 mins                    G Codes:      Quin Hoop 10/31/2014, 8:32 AM  Roney Marion, PT  Acute Rehabilitation Services Pager [phone] Office [phone]

## 2014-10-31 NOTE — Progress Notes (Signed)
Occupational Therapy Treatment Patient Details Name: Aaron Sellers MRN: [id number] DOB: Oct 28, 1950 Today's Date: 10/31/2014    History of present illness Pt is a 64 y.o. M s/p L TKA.  Pt's PMH includes HTN, COPD.   OT comments  Pt progressing. Feel pt is safe to d/c home from OT standpoint, with family available to assist.   Follow Up Recommendations  No OT follow up;Supervision - Intermittent    Equipment Recommendations  None recommended by OT    Recommendations for Other Services      Precautions / Restrictions Precautions Precautions: Knee;Fall Precaution Booklet Issued: No Precaution Comments: Reviewed knee precautions Required Braces or Orthoses: Knee Immobilizer - Left Knee Immobilizer - Left:  (not specified in order, but benefits stance stability) Restrictions Weight Bearing Restrictions: Yes LLE Weight Bearing: Weight bearing as tolerated       Mobility Bed Mobility   General bed mobility comments: not assessed  Transfers Overall transfer level: Needs assistance Transfers: Sit to/from Stand Sit to Stand: Min guard            Balance  Min guard for ambulation with RW.  Used RW for support upon standing.                               ADL Overall ADL's : Needs assistance/impaired                     Lower Body Dressing: Minimal assistance;With adaptive equipment;Sit to/from stand Lower Body Dressing Details (indicate cue type and reason): assist with left sock Toilet Transfer: Min guard;Ambulation;RW (chair)       Tub/ Shower Transfer: Walk-in shower;Min guard;Ambulation;3 in 1;Rolling walker   Functional mobility during ADLs: Min guard;Rolling walker General ADL Comments: Educated on two shower transfer techniques and pt practiced both (simulated). Discussed pt using walker for shower transfer for more support. Educated on safety such as safe footwear, use of bag on walker, rugs/items on floor, and recommended someone be with  him for shower transfer. Pt able to don underwear and doffed left sock-OT assisted with donning left sock. Pt practiced with sockaid.       Vision                     Perception     Praxis      Cognition  Awake/Alert Behavior During Therapy: WFL for tasks assessed/performed Overall Cognitive Status: Within Functional Limits for tasks assessed                       Extremity/Trunk Assessment                  Shoulder Instructions       General Comments      Pertinent Vitals/ Pain       Pain Assessment: 0-10 Pain Score: 5  Pain Location: left knee Pain Descriptors / Indicators: Throbbing;Sore;Shooting Pain Intervention(s): Repositioned;Monitored during session  Home Living                                          Prior Functioning/Environment              Frequency Min 2X/week     Progress Toward Goals  OT Goals(current goals can now be found in the care plan section)  Progress towards OT goals: Progressing toward goals  Acute Rehab OT Goals Patient Stated Goal: not stated OT Goal Formulation: With patient Time For Goal Achievement: 11/06/14 Potential to Achieve Goals: Good ADL Goals Pt Will Perform Lower Body Dressing: with set-up;sit to/from stand;with supervision Pt Will Transfer to Toilet: ambulating;with supervision (elevated toilet) Pt Will Perform Tub/Shower Transfer: Shower transfer;with supervision;ambulating;shower seat;rolling walker  Plan Discharge plan remains appropriate    Co-evaluation                 End of Session Equipment Utilized During Treatment: Gait belt;Rolling walker;Left knee immobilizer   Activity Tolerance Patient tolerated treatment well   Patient Left in chair;with call bell/phone within reach   Nurse Communication          Time: 0900-0920 OT Time Calculation (min): 20 min  Charges: OT General Charges $OT Visit: 1 Procedure OT Treatments $Self Care/Home  Management : 8-22 mins  Benito Mccreedy OTR/L [phone] 10/31/2014, 9:26 AM

## 2014-10-31 NOTE — Progress Notes (Signed)
D/C teaching provided to patient and his wife - reviewed d/c papers, Rx's, care for dressing and answered questions. Nursing Tech took patient via w/c with wife and personal belongings to [employer] exit. Patient medicated prior to d/c - long drive home and lives in [place], [place]. Stated pain was a "2" when he left. Additionally, gave patient an ice back to promote comfort during the drive home to apply to his leg.

## 2014-11-01 ENCOUNTER — Encounter (HOSPITAL_COMMUNITY): Payer: Self-pay | Admitting: Orthopedic Surgery

## 2014-11-01 MED ORDER — CEFAZOLIN SODIUM 10 G IJ SOLR
3.0000 g | INTRAMUSCULAR | Status: DC | PRN
  Administered 2014-10-29: 3 g via INTRAVENOUS

## 2014-11-01 NOTE — Care Management (Signed)
11/01/14 Case manager received call from Patient's girlfriend, [name]- stating Mr. Borgwardt is setup with [facility]. Case manager contacted [place] to confirm. Faxed orders to her at [phone].

## 2014-11-01 NOTE — Addendum Note (Signed)
Addendum  created 11/01/14 1302 by Shirlyn Goltz, CRNA   Modules edited: Anesthesia Medication Administration

## 2014-11-01 NOTE — Discharge Instructions (Signed)

## 2014-12-01 DIAGNOSIS — M1712 Unilateral primary osteoarthritis, left knee: Secondary | ICD-10-CM | POA: Diagnosis present

## 2014-12-01 NOTE — Discharge Summary (Signed)
Patient ID: Aaron Sellers MRN: [id number] DOB/AGE: [birth year] 64 y.o.  Admit date: 10/29/2014 Discharge date: 10/31/2014  Admission Diagnoses:  Principal Problem:   Primary osteoarthritis of left knee   Discharge Diagnoses:  Same  Past Medical History  Diagnosis Date  . Hypertension     dx around 2011  . COPD (chronic obstructive pulmonary disease)     has been smoking since he was 22 ish--  . High cholesterol   . BPH (benign prostatic hypertrophy) with urinary retention   . Arthritis   . Chronic kidney disease     hx of kidney stones  2011    Surgeries: Procedure(s):left TOTAL KNEE ARTHROPLASTY on 10/29/2014   Discharged Condition: Improved  Hospital Course: Berel Najjar is an 64 y.o. male who was admitted 10/29/2014 for operative treatment ofPrimary osteoarthritis of left knee. Patient has severe unremitting pain that affects sleep, daily activities, and work/hobbies. After pre-op clearance the patient was taken to the operating room on 10/29/2014 and underwent  Procedure(s):left TOTAL KNEE ARTHROPLASTY.    Patient was given perioperative antibiotics:      Anti-infectives    Start     Dose/Rate Route Frequency Ordered Stop   10/29/14 1630  ceFAZolin (ANCEF) IVPB 2 g/50 mL premix     2 g 100 mL/hr over 30 Minutes Intravenous Every 6 hours 10/29/14 1617 10/30/14 0429   10/29/14 0800  ceFAZolin (ANCEF) 3 g in dextrose 5 % 50 mL IVPB  Status:  Discontinued     3 g 160 mL/hr over 30 Minutes Intravenous To ShortStay Surgical 10/28/14 1034 10/29/14 1508       Patient was given sequential compression devices, early ambulation, and chemoprophylaxis to prevent DVT.  Patient benefited maximally from hospital stay and there were no complications.    Recent vital signs: see chart for details   Recent laboratory studies:  Recent Results (from the past 2160 hour(s))  Urinalysis, Routine w reflex microscopic (not at [facility])     Status: Abnormal   Collection Time: 10/21/14  9:59 AM   Result Value Ref Range   Color, Urine AMBER (A) YELLOW    Comment: BIOCHEMICALS MAY BE AFFECTED BY COLOR   APPearance CLEAR CLEAR   Specific Gravity, Urine 1.019 1.005 - 1.030   pH 7.5 5.0 - 8.0   Glucose, UA NEGATIVE NEGATIVE mg/dL   Hgb urine dipstick NEGATIVE NEGATIVE   Bilirubin Urine NEGATIVE NEGATIVE   Ketones, ur NEGATIVE NEGATIVE mg/dL   Protein, ur NEGATIVE NEGATIVE mg/dL   Urobilinogen, UA 1.0 0.0 - 1.0 mg/dL   Nitrite NEGATIVE NEGATIVE   Leukocytes, UA NEGATIVE NEGATIVE    Comment: MICROSCOPIC NOT DONE ON URINES WITH NEGATIVE PROTEIN, BLOOD, LEUKOCYTES, NITRITE, OR GLUCOSE <1000 mg/dL.  Type and screen     Status: None   Collection Time: 10/21/14 10:00 AM  Result Value Ref Range   ABO/RH(D) O POS    Antibody Screen NEG    Sample Expiration 11/04/2014   Surgical pcr screen     Status: None   Collection Time: 10/21/14 10:00 AM  Result Value Ref Range   MRSA, PCR NEGATIVE NEGATIVE   Staphylococcus aureus NEGATIVE NEGATIVE    Comment:        The Xpert SA Assay (FDA approved for NASAL specimens in patients over 65 years of age), is one component of a comprehensive surveillance program.  Test performance has been validated by [facility] for patients greater than or equal to 5 year old. It is not intended to diagnose  infection nor to guide or monitor treatment.   ABO/Rh     Status: None   Collection Time: 10/21/14 10:00 AM  Result Value Ref Range   ABO/RH(D) O POS   APTT     Status: None   Collection Time: 10/21/14 10:02 AM  Result Value Ref Range   aPTT 29 24 - 37 seconds  CBC WITH DIFFERENTIAL     Status: Abnormal   Collection Time: 10/21/14 10:02 AM  Result Value Ref Range   WBC 11.0 (H) 4.0 - 10.5 K/uL   RBC 6.02 (H) 4.22 - 5.81 MIL/uL   Hemoglobin 17.8 (H) 13.0 - 17.0 g/dL   HCT 52.1 (H) 39.0 - 52.0 %   MCV 86.5 78.0 - 100.0 fL   MCH 29.6 26.0 - 34.0 pg   MCHC 34.2 30.0 - 36.0 g/dL   RDW 13.4 11.5 - 15.5 %   Platelets 218 150 - 400 K/uL    Neutrophils Relative % 67 43 - 77 %   Neutro Abs 7.3 1.7 - 7.7 K/uL   Lymphocytes Relative 22 12 - 46 %   Lymphs Abs 2.4 0.7 - 4.0 K/uL   Monocytes Relative 9 3 - 12 %   Monocytes Absolute 1.0 0.1 - 1.0 K/uL   Eosinophils Relative 1 0 - 5 %   Eosinophils Absolute 0.2 0.0 - 0.7 K/uL   Basophils Relative 1 0 - 1 %   Basophils Absolute 0.1 0.0 - 0.1 K/uL  Comprehensive metabolic panel     Status: Abnormal   Collection Time: 10/21/14 10:02 AM  Result Value Ref Range   Sodium 140 135 - 145 mmol/L   Potassium 4.4 3.5 - 5.1 mmol/L   Chloride 101 101 - 111 mmol/L   CO2 30 22 - 32 mmol/L   Glucose, Bld 104 (H) 65 - 99 mg/dL   BUN 11 6 - 20 mg/dL   Creatinine, Ser 1.08 0.61 - 1.24 mg/dL   Calcium 10.0 8.9 - 10.3 mg/dL   Total Protein 7.6 6.5 - 8.1 g/dL   Albumin 4.1 3.5 - 5.0 g/dL   AST 28 15 - 41 U/L   ALT 39 17 - 63 U/L   Alkaline Phosphatase 76 38 - 126 U/L   Total Bilirubin 1.1 0.3 - 1.2 mg/dL   GFR calc non Af Amer >60 >60 mL/min   GFR calc Af Amer >60 >60 mL/min    Comment: (NOTE) The eGFR has been calculated using the CKD EPI equation. This calculation has not been validated in all clinical situations. eGFR's persistently <60 mL/min signify possible Chronic Kidney Disease.    Anion gap 9 5 - 15  Protime-INR     Status: None   Collection Time: 10/21/14 10:02 AM  Result Value Ref Range   Prothrombin Time 13.5 11.6 - 15.2 seconds   INR 1.01 0.00 - 1.49  CBC     Status: Abnormal   Collection Time: 10/30/14  3:57 AM  Result Value Ref Range   WBC 14.1 (H) 4.0 - 10.5 K/uL   RBC 4.92 4.22 - 5.81 MIL/uL   Hemoglobin 14.1 13.0 - 17.0 g/dL   HCT 42.1 39.0 - 52.0 %   MCV 85.6 78.0 - 100.0 fL   MCH 28.7 26.0 - 34.0 pg   MCHC 33.5 30.0 - 36.0 g/dL   RDW 13.5 11.5 - 15.5 %   Platelets 194 150 - 400 K/uL  Basic metabolic panel     Status: Abnormal   Collection Time: 10/30/14  3:57 AM  Result Value Ref Range   Sodium 134 (L) 135 - 145 mmol/L   Potassium 3.3 (L) 3.5 - 5.1 mmol/L    Chloride 100 (L) 101 - 111 mmol/L   CO2 27 22 - 32 mmol/L   Glucose, Bld 108 (H) 65 - 99 mg/dL   BUN 15 6 - 20 mg/dL   Creatinine, Ser 0.90 0.61 - 1.24 mg/dL   Calcium 8.4 (L) 8.9 - 10.3 mg/dL   GFR calc non Af Amer >60 >60 mL/min   GFR calc Af Amer >60 >60 mL/min    Comment: (NOTE) The eGFR has been calculated using the CKD EPI equation. This calculation has not been validated in all clinical situations. eGFR's persistently <60 mL/min signify possible Chronic Kidney Disease.    Anion gap 7 5 - 15  CBC     Status: Abnormal   Collection Time: 10/31/14  5:45 AM  Result Value Ref Range   WBC 14.2 (H) 4.0 - 10.5 K/uL   RBC 4.82 4.22 - 5.81 MIL/uL   Hemoglobin 14.1 13.0 - 17.0 g/dL   HCT 41.1 39.0 - 52.0 %   MCV 85.3 78.0 - 100.0 fL   MCH 29.3 26.0 - 34.0 pg   MCHC 34.3 30.0 - 36.0 g/dL   RDW 13.4 11.5 - 15.5 %   Platelets 179 150 - 400 K/uL  Basic metabolic panel     Status: Abnormal   Collection Time: 10/31/14  5:45 AM  Result Value Ref Range   Sodium 132 (L) 135 - 145 mmol/L   Potassium 3.4 (L) 3.5 - 5.1 mmol/L   Chloride 98 (L) 101 - 111 mmol/L   CO2 26 22 - 32 mmol/L   Glucose, Bld 124 (H) 65 - 99 mg/dL   BUN 12 6 - 20 mg/dL   Creatinine, Ser 0.74 0.61 - 1.24 mg/dL   Calcium 8.4 (L) 8.9 - 10.3 mg/dL   GFR calc non Af Amer >60 >60 mL/min   GFR calc Af Amer >60 >60 mL/min    Comment: (NOTE) The eGFR has been calculated using the CKD EPI equation. This calculation has not been validated in all clinical situations. eGFR's persistently <60 mL/min signify possible Chronic Kidney Disease.    Anion gap 8 5 - 15    Discharge Medications:     Medication List    TAKE these medications        amLODipine 5 MG tablet  Commonly known as:  NORVASC  Take 5 mg by mouth daily.     aspirin 325 MG EC tablet  Take 1 tablet (325 mg total) by mouth 2 (two) times daily.     atorvastatin 10 MG tablet  Commonly known as:  LIPITOR  Take 10 mg by mouth daily.     famotidine 10  MG tablet  Commonly known as:  PEPCID  Take 10 mg by mouth as needed for heartburn or indigestion.     hydrochlorothiazide 25 MG tablet  Commonly known as:  HYDRODIURIL  Take 25 mg by mouth daily.     HYDROmorphone 2 MG tablet  Commonly known as:  DILAUDID  Take 1 tablet (2 mg total) by mouth every 4 (four) hours as needed for severe pain (1 - 2 TABLETS Q 4H PRN PAIN).     ibuprofen 200 MG tablet  Commonly known as:  ADVIL,MOTRIN  Take 400 mg by mouth every 6 (six) hours as needed for mild pain.     methocarbamol 500 MG tablet  Commonly known as:  ROBAXIN  Take 1 tablet (500 mg total) by mouth every 6 (six) hours as needed for muscle spasms.     metoCLOPramide 5 MG tablet  Commonly known as:  REGLAN  Take 1-2 tablets (5-10 mg total) by mouth every 8 (eight) hours as needed for nausea (if ondansetron (ZOFRAN) ineffective.).     tamsulosin 0.4 MG Caps capsule  Commonly known as:  FLOMAX  Take 0.4 mg by mouth daily.        Diagnostic Studies: No results found.  Disposition: 06-Home-Health Care Svc  Discharge Instructions    Ambulatory referral to Home Health    Complete by:  As directed   Please evaluate Kamaal Cast for admission to [facility].  Disciplines requested: PT  Services to provide: PT  Physician to follow patient's care (the person listed here will be responsible for signing ongoing orders): Dr Berenice Primas  Requested Start of Care Date: 07/06/9505  I certify that this patient is under my care and that I, or a Nurse Practitioner or Physician's Assistant working with me, had a face-to-face encounter that meets the physician face-to-face requirements with patient on 10/30/2014. The encounter with the patient was in whole, or in part for the following medical condition(s) which is the primary reason for home health care (List medical condition). L TKR  Special Instructions: None  Does the patient have Medicare or Medicaid?:  No  The encounter with the patient was in  whole, or in part, for the following medical condition, which is the primary reason for home health care:  Left total knee replacement  Reason for Medically Necessary Home Health Services:  Therapy- Home Adaptation to Facilitate Safety  My clinical findings support the need for the above services:  Unable to leave home safely without assistance and/or assistive device  I certify that, based on my findings, the following services are medically necessary home health services:  Physical therapy  Further, I certify that my clinical findings support that this patient is homebound due to:  Unable to leave home safely without assistance           Follow-up Information    Follow up with GRAVES,JOHN L, MD. Schedule an appointment as soon as possible for a visit in 2 weeks.   Specialty:  Orthopedic Surgery   Contact information:   [place] [address] [phone]        Signed: Erlene Senters 12/01/2014, 10:57 AM

## 2017-01-26 ENCOUNTER — Emergency Department (HOSPITAL_COMMUNITY): Payer: Managed Care, Other (non HMO)

## 2017-01-26 ENCOUNTER — Inpatient Hospital Stay (HOSPITAL_COMMUNITY)
Admission: EM | Admit: 2017-01-26 | Discharge: 2017-01-30 | DRG: 309 | Disposition: A | Discharge status: Home / Self Care | Payer: Managed Care, Other (non HMO) | Attending: Internal Medicine | Admitting: Internal Medicine

## 2017-01-26 ENCOUNTER — Encounter (HOSPITAL_COMMUNITY): Payer: Self-pay

## 2017-01-26 DIAGNOSIS — I722 Aneurysm of renal artery: Secondary | ICD-10-CM | POA: Diagnosis present

## 2017-01-26 DIAGNOSIS — K5780 Diverticulitis of intestine, part unspecified, with perforation and abscess without bleeding: Secondary | ICD-10-CM | POA: Diagnosis present

## 2017-01-26 DIAGNOSIS — D72829 Elevated white blood cell count, unspecified: Secondary | ICD-10-CM | POA: Diagnosis present

## 2017-01-26 DIAGNOSIS — Z96652 Presence of left artificial knee joint: Secondary | ICD-10-CM | POA: Diagnosis present

## 2017-01-26 DIAGNOSIS — Z23 Encounter for immunization: Secondary | ICD-10-CM | POA: Diagnosis not present

## 2017-01-26 DIAGNOSIS — I4891 Unspecified atrial fibrillation: Secondary | ICD-10-CM | POA: Diagnosis present

## 2017-01-26 DIAGNOSIS — R918 Other nonspecific abnormal finding of lung field: Secondary | ICD-10-CM | POA: Diagnosis not present

## 2017-01-26 DIAGNOSIS — I129 Hypertensive chronic kidney disease with stage 1 through stage 4 chronic kidney disease, or unspecified chronic kidney disease: Secondary | ICD-10-CM | POA: Diagnosis present

## 2017-01-26 DIAGNOSIS — R338 Other retention of urine: Secondary | ICD-10-CM | POA: Diagnosis present

## 2017-01-26 DIAGNOSIS — R1031 Right lower quadrant pain: Secondary | ICD-10-CM | POA: Diagnosis not present

## 2017-01-26 DIAGNOSIS — K5732 Diverticulitis of large intestine without perforation or abscess without bleeding: Secondary | ICD-10-CM

## 2017-01-26 DIAGNOSIS — I10 Essential (primary) hypertension: Secondary | ICD-10-CM | POA: Diagnosis not present

## 2017-01-26 DIAGNOSIS — N189 Chronic kidney disease, unspecified: Secondary | ICD-10-CM | POA: Diagnosis present

## 2017-01-26 DIAGNOSIS — Z91048 Other nonmedicinal substance allergy status: Secondary | ICD-10-CM | POA: Diagnosis not present

## 2017-01-26 DIAGNOSIS — R000 Tachycardia, unspecified: Secondary | ICD-10-CM | POA: Diagnosis present

## 2017-01-26 DIAGNOSIS — F17210 Nicotine dependence, cigarettes, uncomplicated: Secondary | ICD-10-CM | POA: Diagnosis present

## 2017-01-26 DIAGNOSIS — Z87442 Personal history of urinary calculi: Secondary | ICD-10-CM

## 2017-01-26 DIAGNOSIS — E278 Other specified disorders of adrenal gland: Secondary | ICD-10-CM | POA: Diagnosis present

## 2017-01-26 DIAGNOSIS — I480 Paroxysmal atrial fibrillation: Secondary | ICD-10-CM | POA: Diagnosis present

## 2017-01-26 DIAGNOSIS — Z79899 Other long term (current) drug therapy: Secondary | ICD-10-CM | POA: Diagnosis not present

## 2017-01-26 DIAGNOSIS — Z885 Allergy status to narcotic agent status: Secondary | ICD-10-CM | POA: Diagnosis not present

## 2017-01-26 DIAGNOSIS — I4892 Unspecified atrial flutter: Secondary | ICD-10-CM

## 2017-01-26 DIAGNOSIS — E279 Disorder of adrenal gland, unspecified: Secondary | ICD-10-CM | POA: Diagnosis not present

## 2017-01-26 DIAGNOSIS — E876 Hypokalemia: Secondary | ICD-10-CM | POA: Diagnosis present

## 2017-01-26 DIAGNOSIS — E7800 Pure hypercholesterolemia, unspecified: Secondary | ICD-10-CM | POA: Diagnosis present

## 2017-01-26 DIAGNOSIS — I4589 Other specified conduction disorders: Secondary | ICD-10-CM | POA: Diagnosis present

## 2017-01-26 DIAGNOSIS — N401 Enlarged prostate with lower urinary tract symptoms: Secondary | ICD-10-CM

## 2017-01-26 DIAGNOSIS — C3411 Malignant neoplasm of upper lobe, right bronchus or lung: Secondary | ICD-10-CM | POA: Diagnosis present

## 2017-01-26 DIAGNOSIS — E871 Hypo-osmolality and hyponatremia: Secondary | ICD-10-CM | POA: Diagnosis present

## 2017-01-26 DIAGNOSIS — I440 Atrioventricular block, first degree: Secondary | ICD-10-CM | POA: Diagnosis present

## 2017-01-26 DIAGNOSIS — K5720 Diverticulitis of large intestine with perforation and abscess without bleeding: Secondary | ICD-10-CM | POA: Diagnosis present

## 2017-01-26 DIAGNOSIS — J449 Chronic obstructive pulmonary disease, unspecified: Secondary | ICD-10-CM | POA: Diagnosis present

## 2017-01-26 DIAGNOSIS — Z720 Tobacco use: Secondary | ICD-10-CM | POA: Diagnosis not present

## 2017-01-26 DIAGNOSIS — Z8249 Family history of ischemic heart disease and other diseases of the circulatory system: Secondary | ICD-10-CM

## 2017-01-26 LAB — COMPREHENSIVE METABOLIC PANEL
ALK PHOS: 70 U/L (ref 38–126)
ALT: 24 U/L (ref 17–63)
ANION GAP: 12 (ref 5–15)
AST: 19 U/L (ref 15–41)
Albumin: 3.6 g/dL (ref 3.5–5.0)
BUN: 21 mg/dL — ABNORMAL HIGH (ref 6–20)
CO2: 26 mmol/L (ref 22–32)
CREATININE: 0.87 mg/dL (ref 0.61–1.24)
Calcium: 9.4 mg/dL (ref 8.9–10.3)
Chloride: 99 mmol/L — ABNORMAL LOW (ref 101–111)
Glucose, Bld: 120 mg/dL — ABNORMAL HIGH (ref 65–99)
Potassium: 3.3 mmol/L — ABNORMAL LOW (ref 3.5–5.1)
SODIUM: 137 mmol/L (ref 135–145)
Total Bilirubin: 1.7 mg/dL — ABNORMAL HIGH (ref 0.3–1.2)
Total Protein: 7.4 g/dL (ref 6.5–8.1)

## 2017-01-26 LAB — URINALYSIS, ROUTINE W REFLEX MICROSCOPIC
Bacteria, UA: NONE SEEN
Bilirubin Urine: NEGATIVE
Glucose, UA: NEGATIVE mg/dL
Ketones, ur: 5 mg/dL — AB
Leukocytes, UA: NEGATIVE
Nitrite: NEGATIVE
PH: 7 (ref 5.0–8.0)
PROTEIN: NEGATIVE mg/dL
SPECIFIC GRAVITY, URINE: 1.026 (ref 1.005–1.030)
SQUAMOUS EPITHELIAL / LPF: NONE SEEN

## 2017-01-26 LAB — CBC
HCT: 46.1 % (ref 39.0–52.0)
Hemoglobin: 15.9 g/dL (ref 13.0–17.0)
MCH: 29.4 pg (ref 26.0–34.0)
MCHC: 34.5 g/dL (ref 30.0–36.0)
MCV: 85.2 fL (ref 78.0–100.0)
PLATELETS: 210 10*3/uL (ref 150–400)
RBC: 5.41 MIL/uL (ref 4.22–5.81)
RDW: 13.2 % (ref 11.5–15.5)
WBC: 19.2 10*3/uL — ABNORMAL HIGH (ref 4.0–10.5)

## 2017-01-26 LAB — LIPASE, BLOOD: LIPASE: 18 U/L (ref 11–51)

## 2017-01-26 LAB — TROPONIN I

## 2017-01-26 MED ORDER — SODIUM CHLORIDE 0.9 % IV BOLUS (SEPSIS)
500.0000 mL | Freq: Once | INTRAVENOUS | Status: AC
  Administered 2017-01-26: 500 mL via INTRAVENOUS

## 2017-01-26 MED ORDER — CIPROFLOXACIN IN D5W 400 MG/200ML IV SOLN
400.0000 mg | Freq: Two times a day (BID) | INTRAVENOUS | Status: AC
  Administered 2017-01-27 – 2017-01-29 (×6): 400 mg via INTRAVENOUS
  Filled 2017-01-26 (×6): qty 200

## 2017-01-26 MED ORDER — DILTIAZEM HCL-DEXTROSE 100-5 MG/100ML-% IV SOLN (PREMIX)
5.0000 mg/h | INTRAVENOUS | Status: DC
  Administered 2017-01-26: 10 mg/h via INTRAVENOUS
  Administered 2017-01-27: 5 mg/h via INTRAVENOUS
  Filled 2017-01-26: qty 100

## 2017-01-26 MED ORDER — ATORVASTATIN CALCIUM 10 MG PO TABS
10.0000 mg | ORAL_TABLET | Freq: Every day | ORAL | Status: DC
  Administered 2017-01-27 – 2017-01-29 (×3): 10 mg via ORAL
  Filled 2017-01-26 (×4): qty 1

## 2017-01-26 MED ORDER — TAMSULOSIN HCL 0.4 MG PO CAPS
0.4000 mg | ORAL_CAPSULE | Freq: Every day | ORAL | Status: DC
  Administered 2017-01-27 – 2017-01-30 (×4): 0.4 mg via ORAL
  Filled 2017-01-26 (×4): qty 1

## 2017-01-26 MED ORDER — PIPERACILLIN-TAZOBACTAM 3.375 G IVPB 30 MIN: 3.3750 g | Freq: Once | INTRAVENOUS | Status: DC

## 2017-01-26 MED ORDER — IOPAMIDOL (ISOVUE-300) INJECTION 61%
100.0000 mL | Freq: Once | INTRAVENOUS | Status: AC | PRN
  Administered 2017-01-26: 100 mL via INTRAVENOUS

## 2017-01-26 MED ORDER — CIPROFLOXACIN IN D5W 400 MG/200ML IV SOLN
400.0000 mg | Freq: Once | INTRAVENOUS | Status: AC
  Administered 2017-01-26: 400 mg via INTRAVENOUS
  Filled 2017-01-26: qty 200

## 2017-01-26 MED ORDER — METRONIDAZOLE IN NACL 5-0.79 MG/ML-% IV SOLN
500.0000 mg | Freq: Three times a day (TID) | INTRAVENOUS | Status: AC
  Administered 2017-01-27 – 2017-01-29 (×9): 500 mg via INTRAVENOUS
  Filled 2017-01-26 (×9): qty 100

## 2017-01-26 MED ORDER — METRONIDAZOLE IN NACL 5-0.79 MG/ML-% IV SOLN
500.0000 mg | Freq: Once | INTRAVENOUS | Status: AC
  Administered 2017-01-26: 500 mg via INTRAVENOUS
  Filled 2017-01-26: qty 100

## 2017-01-26 MED ORDER — POTASSIUM CHLORIDE IN NACL 40-0.9 MEQ/L-% IV SOLN
INTRAVENOUS | Status: DC
  Administered 2017-01-26 – 2017-01-29 (×6): 100 mL/h via INTRAVENOUS
  Administered 2017-01-30: 60 mL/h via INTRAVENOUS

## 2017-01-26 MED ORDER — PNEUMOCOCCAL VAC POLYVALENT 25 MCG/0.5ML IJ INJ
0.5000 mL | INJECTION | INTRAMUSCULAR | Status: AC
  Administered 2017-01-27: 0.5 mL via INTRAMUSCULAR
  Filled 2017-01-26: qty 0.5

## 2017-01-26 MED ORDER — FAMOTIDINE 20 MG PO TABS: 10.0000 mg | ORAL_TABLET | Freq: Two times a day (BID) | ORAL | Status: DC | PRN

## 2017-01-26 MED ORDER — ONDANSETRON HCL 4 MG/2ML IJ SOLN: 4.0000 mg | Freq: Four times a day (QID) | INTRAMUSCULAR | Status: DC | PRN

## 2017-01-26 MED ORDER — DILTIAZEM HCL-DEXTROSE 100-5 MG/100ML-% IV SOLN (PREMIX)
5.0000 mg/h | Freq: Once | INTRAVENOUS | Status: AC
  Administered 2017-01-26: 5 mg/h via INTRAVENOUS
  Filled 2017-01-26: qty 100

## 2017-01-26 MED ORDER — ACETAMINOPHEN 325 MG PO TABS
650.0000 mg | ORAL_TABLET | ORAL | Status: DC | PRN
  Administered 2017-01-27 – 2017-01-28 (×2): 650 mg via ORAL
  Filled 2017-01-26 (×2): qty 2

## 2017-01-26 MED ORDER — ADENOSINE 6 MG/2ML IV SOLN
6.0000 mg | Freq: Once | INTRAVENOUS | Status: AC
  Administered 2017-01-26: 6 mg via INTRAVENOUS
  Filled 2017-01-26: qty 2

## 2017-01-26 NOTE — ED Notes (Signed)
ED Provider at bedside. 

## 2017-01-26 NOTE — H&P (Signed)
History and Physical  Aaron Sellers ZOX:[id number] DOB: [birth year] DOA: 01/26/2017  Referring physician: Dr Lita Mains, ED physician PCP: Sheryle Hail, PA-C  Outpatient Specialists: none  Patient Coming From: home  Chief Complaint: abdominal pain, lack of appetite  HPI: Aaron Sellers is a 66 y.o. male with a history of COPD, tobacco abuse, high cholesterol, hypertension not currently on medications.  Patient seen for 3 days of worsening abdominal pain in the right lower quadrant.  Pain is nonradiating, but accompanied by diminished appetite.  He denies nausea, vomiting, diarrhea, constipation, hematochezia, melena.  He has been feeling more tired over the past day or 2.  Patient went to urgent care and had an EKG done due to tachycardia.  Due to the arrhythmia, the patient was sent to the hospital for evaluation.  Emergency Department Course: CT scan shows diverticulitis with microperforations.  Patient started on Flagyl and ciprofloxacin.  Patient was also noted to have tachycardic arrhythmia.  Patient was given adenosine which showed possible a flutter for atrial fibrillation.  As patient continued with rapid ventricular rate, patient started on Cardizem and briefly transitioned to sinus rhythm.  When Cardizem was discontinued, rapid ventricular rate returned  Review of Systems:   Pt denies any fevers, chills, nausea, vomiting, diarrhea, constipation, shortness of breath, dyspnea on exertion, orthopnea, cough, wheezing, palpitations, headache, vision changes, lightheadedness, dizziness, melena, rectal bleeding.  Review of systems are otherwise negative  Past Medical History:  Diagnosis Date  . Arthritis   . BPH (benign prostatic hypertrophy) with urinary retention   . Chronic kidney disease    hx of kidney stones  2011  . COPD (chronic obstructive pulmonary disease) ([place])    has been smoking since he was 22 ish--  . High cholesterol   . Hypertension    dx around 2011   Past  Surgical History:  Procedure Laterality Date  . HERNIA REPAIR     umbilical hernia  10/1189  . INCISION AND DRAINAGE ABSCESS     "down the middle" of his buttocks  2015  . TOTAL KNEE ARTHROPLASTY Left 10/29/2014   Procedure: TOTAL KNEE ARTHROPLASTY;  Surgeon: Dorna Leitz, MD;  Location: [facility];  Service: Orthopedics;  Laterality: Left;   Social History:  reports that he has been smoking Cigarettes.  He has a 40.00 pack-year smoking history. He has quit using smokeless tobacco. His smokeless tobacco use included Snuff. He reports that he does not drink alcohol or use drugs. Patient lives at home  Allergies  Allergen Reactions  . Adhesive [Tape] Other (See Comments)    Skin peels  . Codeine Other (See Comments)    Jitters, "skin crawling"    No family history on file.  No family history of arrhythmias  Prior to Admission medications   Medication Sig Start Date End Date Taking? Authorizing Provider  amLODipine (NORVASC) 5 MG tablet Take 5 mg by mouth daily.   Yes [provider]  atorvastatin (LIPITOR) 10 MG tablet Take 10 mg by mouth daily.   Yes [provider]  famotidine (PEPCID) 10 MG tablet Take 10 mg by mouth as needed for heartburn or indigestion.   Yes [provider]  hydrochlorothiazide (HYDRODIURIL) 25 MG tablet Take 25 mg by mouth daily.   Yes [provider]  ibuprofen (ADVIL,MOTRIN) 200 MG tablet Take 600-800 mg by mouth every 6 (six) hours as needed for mild pain.    Yes [provider]  tamsulosin (FLOMAX) 0.4 MG CAPS capsule Take 0.4 mg by  mouth daily.   Yes [provider]    Physical Exam: BP (!) 113/95   Pulse 82   Temp 98.6 F (37 C) (Oral)   Resp (!) 26   Ht 6\' 7"  (2.007 m)   Wt 133.4 kg (294 lb)   SpO2 95%   BMI 33.12 kg/m   General: Elderly Caucasian male. Awake and alert and oriented x3. No acute cardiopulmonary distress.  HEENT: Normocephalic atraumatic.  Right and left ears normal in appearance.   Pupils equal, round, reactive to light. Extraocular muscles are intact. Sclerae anicteric and noninjected.  Moist mucosal membranes. No mucosal lesions.  Neck: Neck supple without lymphadenopathy. No carotid bruits. No masses palpated.  Cardiovascular: Tachycardic irregularly irregular rate with normal S2 sounds. No murmurs, rubs, gallops auscultated. No JVD.  Respiratory: Good respiratory effort with no wheezes, rales, rhonchi. Lungs clear to auscultation bilaterally.  No accessory muscle use. Abdomen: Obese.  Soft, tender in the right lower quadrant with no rebound or guarding. Nondistended. Active bowel sounds. No masses or hepatosplenomegaly  Skin: No rashes, lesions, or ulcerations.  Dry, warm to touch. 2+ dorsalis pedis and radial pulses. Musculoskeletal: No calf or leg pain. All major joints not erythematous nontender.  No upper or lower joint deformation.  Good ROM.  No contractures  Psychiatric: Intact judgment and insight. Pleasant and cooperative. Neurologic: No focal neurological deficits. Strength is 5/5 and symmetric in upper and lower extremities.  Cranial nerves II through XII are grossly intact.           Labs on Admission: I have personally reviewed following labs and imaging studies  CBC:  Recent Labs Lab 01/26/17 1458  WBC 19.2*  HGB 15.9  HCT 46.1  MCV 85.2  PLT 403   Basic Metabolic Panel:  Recent Labs Lab 01/26/17 1512  NA 137  K 3.3*  CL 99*  CO2 26  GLUCOSE 120*  BUN 21*  CREATININE 0.87  CALCIUM 9.4   GFR: Estimated Creatinine Clearance: 133 mL/min (by C-G formula based on SCr of 0.87 mg/dL). Liver Function Tests:  Recent Labs Lab 01/26/17 1512  AST 19  ALT 24  ALKPHOS 70  BILITOT 1.7*  PROT 7.4  ALBUMIN 3.6    Recent Labs Lab 01/26/17 1512  LIPASE 18   No results for input(s): AMMONIA in the last 168 hours. Coagulation Profile: No results for input(s): INR, PROTIME in the last 168 hours. Cardiac Enzymes:  Recent Labs Lab  01/26/17 1512  TROPONINI <0.03   BNP (last 3 results) No results for input(s): PROBNP in the last 8760 hours. HbA1C: No results for input(s): HGBA1C in the last 72 hours. CBG: No results for input(s): GLUCAP in the last 168 hours. Lipid Profile: No results for input(s): CHOL, HDL, LDLCALC, TRIG, CHOLHDL, LDLDIRECT in the last 72 hours. Thyroid Function Tests: No results for input(s): TSH, T4TOTAL, FREET4, T3FREE, THYROIDAB in the last 72 hours. Anemia Panel: No results for input(s): VITAMINB12, FOLATE, FERRITIN, TIBC, IRON, RETICCTPCT in the last 72 hours. Urine analysis:    Component Value Date/Time   COLORURINE YELLOW 01/26/2017 [place] 01/26/2017 1512   LABSPEC 1.026 01/26/2017 1512   PHURINE 7.0 01/26/2017 1512   GLUCOSEU NEGATIVE 01/26/2017 1512   HGBUR SMALL (A) 01/26/2017 1512   BILIRUBINUR NEGATIVE 01/26/2017 1512   KETONESUR 5 (A) 01/26/2017 1512   PROTEINUR NEGATIVE 01/26/2017 1512   UROBILINOGEN 1.0 10/21/2014 0959   NITRITE NEGATIVE 01/26/2017 1512   LEUKOCYTESUR NEGATIVE 01/26/2017 1512  Sepsis Labs: @LABRCNTIP (procalcitonin:4,lacticidven:4) )No results found for this or any previous visit (from the past 240 hour(s)).   Radiological Exams on Admission: Dg Chest 2 View  Result Date: 01/26/2017 CLINICAL DATA:  Irregular heartbeat.  Fatigue.  Anorexia. EXAM: CHEST  2 VIEW COMPARISON:  10/21/2014 FINDINGS: The heart size and mediastinal contours are within normal limits. No evidence of pulmonary consolidation or pleural effusion. Mild right lateral pleural thickening is stable. A new irregular masslike opacity is seen in the right upper lobe measuring approximately 3 cm. This is suspicious for neoplasm. IMPRESSION: New 3 mm irregular masslike opacity in right upper lobe, suspicious for neoplasm. Chest CT with contrast is recommended for further evaluation. Electronically Signed   By: Earle Gell M.D.   On: 01/26/2017 17:34   Ct Abdomen Pelvis W  Contrast  Result Date: 01/26/2017 CLINICAL DATA:  Lower abdominal pain EXAM: CT ABDOMEN AND PELVIS WITH CONTRAST TECHNIQUE: Multidetector CT imaging of the abdomen and pelvis was performed using the standard protocol following bolus administration of intravenous contrast. CONTRAST:  13mL ISOVUE-300 IOPAMIDOL (ISOVUE-300) INJECTION 61% COMPARISON:  None. FINDINGS: Lower chest: Bases demonstrate no acute consolidation or pleural effusion. Borderline heart size. Mild coronary calcification. Hepatobiliary: No focal liver abnormality is seen. No gallstones, gallbladder wall thickening, or biliary dilatation. Pancreas: Unremarkable. No pancreatic ductal dilatation or surrounding inflammatory changes. Spleen: Normal in size without focal abnormality. Adrenals/Urinary Tract: Left adrenal gland appears normal. 2.8 cm enhancing mass in the right adrenal gland. Nonspecific perinephric fat stranding. 6 mm stone in the mid right kidney. 2.1 cm cyst in the right kidney. Punctate nonobstructing stone in the mid left kidney. Bladder within normal limits. Stomach/Bowel: The stomach is collapsed. No dilated small bowel. Normal appendix. Diverticular disease of the colon, most heavily concentrated at the sigmoid colon which is redundant. Focal wall thickening and moderate surrounding inflammation at the distal sigmoid colon consistent with diverticulitis. Irregular gas collection adjacent to the sigmoid colon is suspect for contained perforation. No abscess. Vascular/Lymphatic: Extensive atherosclerotic calcifications of the aorta duct. 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 No significantly enlarged lymph nodes. Reproductive: Prostate is unremarkable. Other: Negative for free air or significant free fluid. Musculoskeletal: Degenerative changes of the spine. No acute osseous abnormality. IMPRESSION: 1. Focal wall thickening and moderate surrounding inflammation involving the distal sigmoid colon with  diverticula present, findings would be most consistent with an acute diverticulitis. Suspected small extraluminal gas collection adjacent to the inflamed sigmoid colon concerning for focal contained perforation. No free air or abscess. 2. 2.8 cm enhancing mass in the right adrenal gland, possible adenoma, however recommend non emergent adrenal CT protocol to evaluate 3. Nonobstructing stones within both kidneys. 4. Small aortic aneurysm measuring 3 cm. Recommend followup by ultrasound in 3 years. This recommendation follows ACR consensus guidelines: White Paper of the ACR Incidental Findings Committee II on Vascular Findings. [employer] Coll Radiol 2013; 10:789-794 Electronically Signed   By: Donavan Foil M.D.   On: 01/26/2017 17:30    EKG: Independently reviewed.  Initial rhythm showed tachyarrhythmia with a ventricular rate of 150.  No definitive P waves were seen.  No ST changes.  Second EKG showed sinus rhythm with PVCs.  No ST changes.  #1  Assessment/Plan: Principal Problem:   Atrial fibrillation with RVR (HCC) Active Problems:   Diverticulitis   Mass of upper lobe of right lung    This patient was discussed with the ED physician, including pertinent vitals, physical exam findings, labs,  and imaging.  We also discussed care given by the ED provider.  1.  Atrial fibrillation with RVR  Due to RVR, will continue Cardizem drip and admit the patient to stepdown unit and continue cardiac monitoring.  TSH and T4 tomorrow  Echocardiogram  We will transition patient to oral Cardizem when able  Due to his microperforations of his diverticulitis, will hold on anticoagulation at this time.  However, patient will need to be started on anticoagulation when stable. 2.  Diverticulitis  Clear liquids  Continue ciprofloxacin and Flagyl  CBC tomorrow 3.  Massive upper lobe right lung  CT chest tomorrow  DVT prophylaxis: SCDs for now Consultants: General surgery Code Status: Full code Family  Communication: Wife present in room during interview and exam Disposition Plan: Patient to return home following the hospital stay   Truett Mainland, DO [facility] Pager [phone]  If 7PM-7AM, please contact night-coverage [email or web address] Password [email or web address]

## 2017-01-26 NOTE — ED Provider Notes (Signed)
[facility] Provider Note   CSN: [id number] Arrival date & time: 01/26/17  1424     History   Chief Complaint Chief Complaint  Patient presents with  . Tachycardia  . Fatigue    HPI Aaron Sellers is a 66 y.o. male.  HPI Resents with 2-3 days of right-sided abdominal pain.  States the pain started as generalized abdominal pain but is migrated to the right lower quadrant.  Has associated anorexia and fatigue.  No nausea or vomiting.  Patient denies any chest pain, shortness of breath or palpitations.  Went to urgent care for his abdominal pain and noted to be in a tachydysrhythmia and sent to the emergency department for evaluation.  Patient denies having previous history of cardiac problems.  Has had previous umbilical hernia surgery but no other abdominal surgeries. Past Medical History:  Diagnosis Date  . Arthritis   . BPH (benign prostatic hypertrophy) with urinary retention   . Chronic kidney disease    hx of kidney stones  2011  . COPD (chronic obstructive pulmonary disease) ([place])    has been smoking since he was 22 ish--  . High cholesterol   . Hypertension    dx around 2011    Patient Active Problem List   Diagnosis Date Noted  . Hypokalemia 01/27/2017  . Adrenal mass ([place]) 01/27/2017  . Tobacco abuse 01/27/2017  . COPD (chronic obstructive pulmonary disease) ([place]) 01/27/2017  . Hypertension 01/27/2017  . BPH (benign prostatic hypertrophy) with urinary retention 01/27/2017  . High cholesterol 01/27/2017  . Chronic kidney disease 01/27/2017  . Hyponatremia 01/27/2017  . Leukocytosis 01/27/2017  . RLQ abdominal pain 01/27/2017  . Diverticulitis of intestine with perforation 01/26/2017  . Atrial fibrillation with RVR ([place]) 01/26/2017  . Mass of upper lobe of right lung 01/26/2017  . Primary osteoarthritis of left knee 12/01/2014    Past Surgical History:  Procedure Laterality Date  . HERNIA REPAIR     umbilical hernia  04/1939  .  INCISION AND DRAINAGE ABSCESS     "down the middle" of his buttocks  2015  . TOTAL KNEE ARTHROPLASTY Left 10/29/2014   Procedure: TOTAL KNEE ARTHROPLASTY;  Surgeon: Dorna Leitz, MD;  Location: [facility];  Service: Orthopedics;  Laterality: Left;       Home Medications    Prior to Admission medications   Medication Sig Start Date End Date Taking? Authorizing Provider  amLODipine (NORVASC) 5 MG tablet Take 5 mg by mouth daily.   Yes [provider]  atorvastatin (LIPITOR) 10 MG tablet Take 10 mg by mouth daily.   Yes [provider]  famotidine (PEPCID) 10 MG tablet Take 10 mg by mouth as needed for heartburn or indigestion.   Yes [provider]  hydrochlorothiazide (HYDRODIURIL) 25 MG tablet Take 25 mg by mouth daily.   Yes [provider]  ibuprofen (ADVIL,MOTRIN) 200 MG tablet Take 600-800 mg by mouth every 6 (six) hours as needed for mild pain.    Yes [provider]  tamsulosin (FLOMAX) 0.4 MG CAPS capsule Take 0.4 mg by mouth daily.   Yes [provider]    Family History History reviewed. No pertinent family history.  Social History Social History  Substance Use Topics  . Smoking status: Current Every Day Smoker    Packs/day: 1.00    Years: 40.00    Types: Cigarettes  . Smokeless tobacco: Former Systems developer    Types: Snuff  . Alcohol use No  Allergies   Adhesive [tape] and Codeine   Review of Systems Review of Systems  Constitutional: Positive for fatigue. Negative for chills and fever.  Respiratory: Positive for wheezing. Negative for cough and shortness of breath.   Cardiovascular: Negative for chest pain, palpitations and leg swelling.  Gastrointestinal: Positive for abdominal pain. Negative for diarrhea, nausea and vomiting.  Genitourinary: Negative for dysuria, flank pain and frequency.  Musculoskeletal: Negative for back pain, myalgias, neck pain and neck stiffness.  Skin: Negative for rash and wound.    Neurological: Negative for dizziness, weakness, light-headedness, numbness and headaches.  All other systems reviewed and are negative.    Physical Exam Updated Vital Signs BP 124/82   Pulse 63   Temp 98.9 F (37.2 C) (Oral)   Resp (!) 22   Ht 6\' 7"  (2.007 m)   Wt 128.3 kg (282 lb 13.6 oz)   SpO2 96%   BMI 31.86 kg/m   Physical Exam  Constitutional: He is oriented to person, place, and time. He appears well-developed and well-nourished. No distress.  HENT:  Head: Normocephalic and atraumatic.  Mouth/Throat: Oropharynx is clear and moist. No oropharyngeal exudate.  Eyes: Pupils are equal, round, and reactive to light. EOM are normal.  Neck: Normal range of motion. Neck supple.  Cardiovascular:  Tachycardia.  Irregularly irregular.  Pulmonary/Chest: Effort normal. He has wheezes.  Mild expiratory wheezing throughout.  Abdominal: Soft. Bowel sounds are normal. There is tenderness. There is no rebound and no guarding.  Right lower quadrant tenderness to palpation with rebound tenderness.  Musculoskeletal: Normal range of motion. He exhibits no edema or tenderness.  Neurological: He is alert and oriented to person, place, and time.  Moving all extremities without focal deficit.  Sensation fully intact.  Skin: Skin is warm and dry. Capillary refill takes less than 2 seconds. No rash noted. No erythema.  Psychiatric: He has a normal mood and affect. His behavior is normal.  Nursing note and vitals reviewed.    ED Treatments / Results  Labs (all labs ordered are listed, but only abnormal results are displayed) Labs Reviewed  CBC - Abnormal; Notable for the following:       Result Value   WBC 19.2 (*)    All other components within normal limits  COMPREHENSIVE METABOLIC PANEL - Abnormal; Notable for the following:    Potassium 3.3 (*)    Chloride 99 (*)    Glucose, Bld 120 (*)    BUN 21 (*)    Total Bilirubin 1.7 (*)    All other components within normal limits   URINALYSIS, ROUTINE W REFLEX MICROSCOPIC - Abnormal; Notable for the following:    Hgb urine dipstick SMALL (*)    Ketones, ur 5 (*)    All other components within normal limits  LIPID PANEL - Abnormal; Notable for the following:    HDL 33 (*)    All other components within normal limits  BASIC METABOLIC PANEL - Abnormal; Notable for the following:    Sodium 134 (*)    Potassium 3.3 (*)    Glucose, Bld 114 (*)    Calcium 8.3 (*)    All other components within normal limits  CBC - Abnormal; Notable for the following:    WBC 13.6 (*)    All other components within normal limits  MRSA PCR SCREENING  LIPASE, BLOOD  TROPONIN I  TSH  T4, FREE  MAGNESIUM  HIV ANTIBODY (ROUTINE TESTING)    EKG  EKG Interpretation  Date/Time:  Saturday January 26 2017 16:28:54 EDT Ventricular Rate:  80 PR Interval:    QRS Duration: 109 QT Interval:  361 QTC Calculation: 417 R Axis:   21 Text Interpretation:  Sinus tachycardia Atrial premature complexes Probable left atrial enlargement Confirmed by Julianne Rice [id number]) on 01/27/2017 4:15:49 PM       Radiology Dg Chest 2 View  Result Date: 01/26/2017 CLINICAL DATA:  Irregular heartbeat.  Fatigue.  Anorexia. EXAM: CHEST  2 VIEW COMPARISON:  10/21/2014 FINDINGS: The heart size and mediastinal contours are within normal limits. No evidence of pulmonary consolidation or pleural effusion. Mild right lateral pleural thickening is stable. A new irregular masslike opacity is seen in the right upper lobe measuring approximately 3 cm. This is suspicious for neoplasm. IMPRESSION: New 3 mm irregular masslike opacity in right upper lobe, suspicious for neoplasm. Chest CT with contrast is recommended for further evaluation. Electronically Signed   By: Earle Gell M.D.   On: 01/26/2017 17:34   Ct Abdomen Pelvis W Contrast  Result Date: 01/26/2017 CLINICAL DATA:  Lower abdominal pain EXAM: CT ABDOMEN AND PELVIS WITH CONTRAST TECHNIQUE: Multidetector CT  imaging of the abdomen and pelvis was performed using the standard protocol following bolus administration of intravenous contrast. CONTRAST:  143mL ISOVUE-300 IOPAMIDOL (ISOVUE-300) INJECTION 61% COMPARISON:  None. FINDINGS: Lower chest: Bases demonstrate no acute consolidation or pleural effusion. Borderline heart size. Mild coronary calcification. Hepatobiliary: No focal liver abnormality is seen. No gallstones, gallbladder wall thickening, or biliary dilatation. Pancreas: Unremarkable. No pancreatic ductal dilatation or surrounding inflammatory changes. Spleen: Normal in size without focal abnormality. Adrenals/Urinary Tract: Left adrenal gland appears normal. 2.8 cm enhancing mass in the right adrenal gland. Nonspecific perinephric fat stranding. 6 mm stone in the mid right kidney. 2.1 cm cyst in the right kidney. Punctate nonobstructing stone in the mid left kidney. Bladder within normal limits. Stomach/Bowel: The stomach is collapsed. No dilated small bowel. Normal appendix. Diverticular disease of the colon, most heavily concentrated at the sigmoid colon which is redundant. Focal wall thickening and moderate surrounding inflammation at the distal sigmoid colon consistent with diverticulitis. Irregular gas collection adjacent to the sigmoid colon is suspect for contained perforation. No abscess. Vascular/Lymphatic: Extensive atherosclerotic calcifications of the aorta duct. 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 No significantly enlarged lymph nodes. Reproductive: Prostate is unremarkable. Other: Negative for free air or significant free fluid. Musculoskeletal: Degenerative changes of the spine. No acute osseous abnormality. IMPRESSION: 1. Focal wall thickening and moderate surrounding inflammation involving the distal sigmoid colon with diverticula present, findings would be most consistent with an acute diverticulitis. Suspected small extraluminal gas collection adjacent to the  inflamed sigmoid colon concerning for focal contained perforation. No free air or abscess. 2. 2.8 cm enhancing mass in the right adrenal gland, possible adenoma, however recommend non emergent adrenal CT protocol to evaluate 3. Nonobstructing stones within both kidneys. 4. Small aortic aneurysm measuring 3 cm. Recommend followup by ultrasound in 3 years. This recommendation follows ACR consensus guidelines: White Paper of the ACR Incidental Findings Committee II on Vascular Findings. [facility] Radiol 2013; 10:789-794 Electronically Signed   By: Donavan Foil M.D.   On: 01/26/2017 17:30    Procedures Procedures (including critical care time)  Medications Ordered in ED Medications  famotidine (PEPCID) tablet 10 mg (not administered)  atorvastatin (LIPITOR) tablet 10 mg (10 mg Oral Not Given 01/26/17 2247)  tamsulosin (FLOMAX) capsule 0.4 mg (0.4 mg Oral Given 01/27/17 0926)  acetaminophen (  TYLENOL) tablet 650 mg (650 mg Oral Given 01/27/17 0239)  ondansetron (ZOFRAN) injection 4 mg (not administered)  ciprofloxacin (CIPRO) IVPB 400 mg (400 mg Intravenous New Bag/Given 01/27/17 0547)  metroNIDAZOLE (FLAGYL) IVPB 500 mg (0 mg Intravenous Stopped 01/27/17 1026)  0.9 % NaCl with KCl 40 mEq / L  infusion (100 mL/hr Intravenous New Bag/Given 01/27/17 1258)  Influenza vac split quadrivalent PF (FLUARIX) injection 0.5 mL (not administered)  ipratropium-albuterol (DUONEB) 0.5-2.5 (3) MG/3ML nebulizer solution 3 mL (3 mLs Nebulization Given 01/27/17 1443)  diltiazem (CARDIZEM) tablet 60 mg (60 mg Oral Given 01/27/17 1427)  nicotine (NICODERM CQ - dosed in mg/24 hours) patch 21 mg (21 mg Transdermal Patch Applied 01/27/17 0926)  adenosine (ADENOCARD) 6 MG/2ML injection 6 mg (6 mg Intravenous Given 01/26/17 1532)  diltiazem (CARDIZEM) 100 mg in dextrose 5% 138mL (1 mg/mL) infusion (0 mg/hr Intravenous Stopped 01/27/17 1030)  sodium chloride 0.9 % bolus 500 mL (0 mLs Intravenous Stopped 01/26/17 1625)    sodium chloride 0.9 % bolus 500 mL (0 mLs Intravenous Stopped 01/26/17 1730)  iopamidol (ISOVUE-300) 61 % injection 100 mL (100 mLs Intravenous Contrast Given 01/26/17 1648)  metroNIDAZOLE (FLAGYL) IVPB 500 mg (0 mg Intravenous Stopped 01/26/17 1909)  ciprofloxacin (CIPRO) IVPB 400 mg (0 mg Intravenous Stopped 01/26/17 1909)  pneumococcal 23 valent vaccine (PNU-IMMUNE) injection 0.5 mL (0.5 mLs Intramuscular Given 01/27/17 0926)  potassium chloride SA (K-DUR,KLOR-CON) CR tablet 40 mEq (40 mEq Oral Given 01/27/17 0746)  magnesium sulfate IVPB 2 g 50 mL (0 g Intravenous Stopped 01/27/17 1241)    CRITICAL CARE Performed by: Lita Mains, Delmore Sear Total critical care time:40 minutes Critical care time was exclusive of separately billable procedures and treating other patients. Critical care was necessary to treat or prevent imminent or life-threatening deterioration. Critical care was time spent personally by me on the following activities: development of treatment plan with patient and/or surrogate as well as nursing, discussions with consultants, evaluation of patient's response to treatment, examination of patient, obtaining history from patient or surrogate, ordering and performing treatments and interventions, ordering and review of laboratory studies, ordering and review of radiographic studies, pulse oximetry and re-evaluation of patient's condition. Initial Impression / Assessment and Plan / ED Course  I have reviewed the triage vital signs and the nursing notes.  Pertinent labs & imaging results that were available during my care of the patient were reviewed by me and considered in my medical decision making (see chart for details).     Give trial of 6 oh grams of adenosine.  Underlying rhythm appears to be 2-1 atrial flutter with frequent PVCs.  Will start on Cardizem drip.  Patient converted to normal sinus rhythm with multiple PVCs.  CT abdomen pelvis reveals diverticulitis with  questionable small contained perforation.  Started on IV Cipro and Flagyl.  Cardizem drip was discontinued and patient converted back to irregular tachyarrhythmia.  Hospitalist see in the emergency department and admit. Final Clinical Impressions(s) / ED Diagnoses   Final diagnoses:  New onset atrial flutter ([place])  Diverticulitis of colon    New Prescriptions Current Discharge Medication List       Julianne Rice, MD 01/27/17 [phone]

## 2017-01-26 NOTE — ED Triage Notes (Addendum)
Pt reports that he worked very hard cutting bushes on Monday. Has been fatigued and had lost of appetitie. Pt has had one loose stool denies CP. Pt went to Urgent care in [place] and EKG performed HR 154. Pt chose to come to [facility] from [place] to be seen. AP rate in triage 140. Denies CP only complains of lower abdominal pain

## 2017-01-26 NOTE — ED Notes (Signed)
ED Provider at bedside. Pt on zoll

## 2017-01-26 NOTE — ED Triage Notes (Signed)
Sent from [facility] ekg AND hr

## 2017-01-27 ENCOUNTER — Inpatient Hospital Stay (HOSPITAL_COMMUNITY): Payer: Managed Care, Other (non HMO)

## 2017-01-27 ENCOUNTER — Encounter (HOSPITAL_COMMUNITY): Payer: Self-pay | Admitting: Family Medicine

## 2017-01-27 DIAGNOSIS — E278 Other specified disorders of adrenal gland: Secondary | ICD-10-CM | POA: Diagnosis present

## 2017-01-27 DIAGNOSIS — J449 Chronic obstructive pulmonary disease, unspecified: Secondary | ICD-10-CM | POA: Diagnosis present

## 2017-01-27 DIAGNOSIS — R338 Other retention of urine: Secondary | ICD-10-CM

## 2017-01-27 DIAGNOSIS — R1031 Right lower quadrant pain: Secondary | ICD-10-CM | POA: Diagnosis present

## 2017-01-27 DIAGNOSIS — Z720 Tobacco use: Secondary | ICD-10-CM | POA: Diagnosis present

## 2017-01-27 DIAGNOSIS — N189 Chronic kidney disease, unspecified: Secondary | ICD-10-CM

## 2017-01-27 DIAGNOSIS — D72829 Elevated white blood cell count, unspecified: Secondary | ICD-10-CM | POA: Diagnosis present

## 2017-01-27 DIAGNOSIS — E871 Hypo-osmolality and hyponatremia: Secondary | ICD-10-CM

## 2017-01-27 DIAGNOSIS — I4891 Unspecified atrial fibrillation: Secondary | ICD-10-CM

## 2017-01-27 DIAGNOSIS — E876 Hypokalemia: Secondary | ICD-10-CM | POA: Diagnosis present

## 2017-01-27 DIAGNOSIS — E279 Disorder of adrenal gland, unspecified: Secondary | ICD-10-CM

## 2017-01-27 DIAGNOSIS — N401 Enlarged prostate with lower urinary tract symptoms: Secondary | ICD-10-CM

## 2017-01-27 DIAGNOSIS — I10 Essential (primary) hypertension: Secondary | ICD-10-CM

## 2017-01-27 DIAGNOSIS — E7800 Pure hypercholesterolemia, unspecified: Secondary | ICD-10-CM

## 2017-01-27 LAB — ECHOCARDIOGRAM COMPLETE
AVLVOTPG: 4 mmHg
CHL CUP DOP CALC LVOT VTI: 19.7 cm
CHL CUP MV DEC (S): 239
CHL CUP STROKE VOLUME: 71 mL
E decel time: 239 msec
EERAT: 8.4
FS: 34 % (ref 28–44)
HEIGHTINCHES: 79 in
IVS/LV PW RATIO, ED: 1
LA ID, A-P, ES: 36 mm
LA diam end sys: 36 mm
LA diam index: 1.33 cm/m2
LA vol index: 24.5 mL/m2
LA vol: 66.3 mL
LAVOLA4C: 58.7 mL
LDCA: 4.15 cm2
LV E/e'average: 8.4
LV SIMPSON'S DISK: 62
LV TDI E'MEDIAL: 7.29
LV dias vol: 115 mL (ref 62–150)
LV e' LATERAL: 10.4 cm/s
LV sys vol index: 16 mL/m2
LVDIAVOLIN: 43 mL/m2
LVEEMED: 8.4
LVOTD: 23 mm
LVOTPV: 98.8 cm/s
LVOTSV: 82 mL
LVSYSVOL: 44 mL
MV Peak grad: 3 mmHg
MV pk A vel: 60.7 m/s
MV pk E vel: 87.4 m/s
PW: 9.9 mm — AB (ref 0.6–1.1)
RV LATERAL S' VELOCITY: 12 cm/s
RV TAPSE: 18.3 mm
TDI e' lateral: 10.4
WEIGHTICAEL: 4525.6 [oz_av]

## 2017-01-27 LAB — BASIC METABOLIC PANEL
Anion gap: 7 (ref 5–15)
BUN: 18 mg/dL (ref 6–20)
CALCIUM: 8.3 mg/dL — AB (ref 8.9–10.3)
CO2: 26 mmol/L (ref 22–32)
CREATININE: 0.77 mg/dL (ref 0.61–1.24)
Chloride: 101 mmol/L (ref 101–111)
GLUCOSE: 114 mg/dL — AB (ref 65–99)
Potassium: 3.3 mmol/L — ABNORMAL LOW (ref 3.5–5.1)
SODIUM: 134 mmol/L — AB (ref 135–145)

## 2017-01-27 LAB — T4, FREE: FREE T4: 1.12 ng/dL (ref 0.61–1.12)

## 2017-01-27 LAB — LIPID PANEL
CHOL/HDL RATIO: 4 ratio
Cholesterol: 133 mg/dL (ref 0–200)
HDL: 33 mg/dL — ABNORMAL LOW (ref >40)
LDL CALC: 85 mg/dL (ref 0–99)
Triglycerides: 77 mg/dL (ref <150)
VLDL: 15 mg/dL (ref 0–40)

## 2017-01-27 LAB — MAGNESIUM: Magnesium: 1.7 mg/dL (ref 1.7–2.4)

## 2017-01-27 LAB — CBC
HCT: 43.4 % (ref 39.0–52.0)
Hemoglobin: 14.5 g/dL (ref 13.0–17.0)
MCH: 28.8 pg (ref 26.0–34.0)
MCHC: 33.4 g/dL (ref 30.0–36.0)
MCV: 86.1 fL (ref 78.0–100.0)
Platelets: 210 10*3/uL (ref 150–400)
RBC: 5.04 MIL/uL (ref 4.22–5.81)
RDW: 13.3 % (ref 11.5–15.5)
WBC: 13.6 10*3/uL — ABNORMAL HIGH (ref 4.0–10.5)

## 2017-01-27 LAB — MRSA PCR SCREENING: MRSA by PCR: NEGATIVE

## 2017-01-27 LAB — TSH: TSH: 2.088 u[IU]/mL (ref 0.350–4.500)

## 2017-01-27 MED ORDER — NICOTINE 21 MG/24HR TD PT24
21.0000 mg | MEDICATED_PATCH | Freq: Every day | TRANSDERMAL | Status: DC
  Administered 2017-01-27 – 2017-01-30 (×4): 21 mg via TRANSDERMAL
  Filled 2017-01-27 (×4): qty 1

## 2017-01-27 MED ORDER — MAGNESIUM SULFATE 2 GM/50ML IV SOLN
2.0000 g | Freq: Once | INTRAVENOUS | Status: AC
  Administered 2017-01-27: 2 g via INTRAVENOUS
  Filled 2017-01-27: qty 50

## 2017-01-27 MED ORDER — POTASSIUM CHLORIDE CRYS ER 20 MEQ PO TBCR
40.0000 meq | EXTENDED_RELEASE_TABLET | Freq: Once | ORAL | Status: AC
  Administered 2017-01-27: 40 meq via ORAL
  Filled 2017-01-27: qty 2

## 2017-01-27 MED ORDER — IPRATROPIUM-ALBUTEROL 0.5-2.5 (3) MG/3ML IN SOLN
3.0000 mL | Freq: Four times a day (QID) | RESPIRATORY_TRACT | Status: DC
  Administered 2017-01-27 – 2017-01-28 (×5): 3 mL via RESPIRATORY_TRACT
  Filled 2017-01-27 (×5): qty 3

## 2017-01-27 MED ORDER — IOPAMIDOL (ISOVUE-300) INJECTION 61%
75.0000 mL | Freq: Once | INTRAVENOUS | Status: AC | PRN
  Administered 2017-01-27: 75 mL via INTRAVENOUS

## 2017-01-27 MED ORDER — DILTIAZEM HCL 60 MG PO TABS
60.0000 mg | ORAL_TABLET | Freq: Three times a day (TID) | ORAL | Status: AC
  Administered 2017-01-27 – 2017-01-29 (×9): 60 mg via ORAL
  Filled 2017-01-27 (×9): qty 1

## 2017-01-27 MED ORDER — INFLUENZA VAC SPLIT QUAD 0.5 ML IM SUSY
0.5000 mL | PREFILLED_SYRINGE | INTRAMUSCULAR | Status: AC
  Administered 2017-01-28: 0.5 mL via INTRAMUSCULAR
  Filled 2017-01-27: qty 0.5

## 2017-01-27 NOTE — Progress Notes (Addendum)
PROGRESS NOTE    Aaron Sellers  GGY:[id number]  DOB: April 16, 1950  DOA: 01/26/2017 PCP: Sheryle Hail, PA-C   Brief Admission Hx: Aaron Sellers is a 66 y.o. male with a history of COPD, tobacco abuse, high cholesterol, hypertension not currently on medications.  Patient seen for 3 days of worsening abdominal pain in the right lower quadrant.  Pain is nonradiating, but accompanied by diminished appetite.  He denies nausea, vomiting, diarrhea, constipation, hematochezia, melena.  He has been feeling more tired over the past day or 2.  Patient went to urgent care and had an EKG done due to tachycardia.  Due to the arrhythmia, the patient was sent to the hospital for evaluation.  MDM/Assessment & Plan:   1. Atrial Flutter/AFib with RVR - Pt is down to 5 mcg on the IV diltiazem, will start oral diltiazem this morning and get him off the IV diltiazem drip.  Normal TSH noted.  Echocardiogram pending.  He is not being anticoagulated now until can be seen by general surgery regarding the abdominal diverticular perforation.  I will ask for cardiology to see him tomorrow.   2. Acute diverticulitis with perforation - Continue IV cipro/flagyl.  General surgery consultation pending.  He was started on clear liquids.  3. RUL Lung Mass - 3 mm lesion seen, CT chest with contrast pending.  I spoke to the patient about this at bedside.  He may need a pulmonary consult pending results of CT chest.   4. COPD - with active wheezing, will order scheduled duonebs. 5. Tobacco abuse - counseled on smoking cessation, will offer nicotine patch.    6. Leukocytosis - WBC trending down with IV antibiotics.   7. Hyponatremia - continue IV hydration with Normal Saline and follow.   8. Hypokalemia - check magnesium, add oral potassium dose this morning.  9. BPH - resume home flomax daily and monitor urine output, catheterize PRN if unable to void.    DVT prophylaxis: SCD and ambulation  Code Status: full  Family  Communication: none present Disposition Plan: TBD  Consultants:  General surgery  cardiology  Subjective: Pt says he is feeling a little better, less RLQ abdominal pain.  No palpitations.   Objective: Vitals:   01/27/17 0447 01/27/17 0500 01/27/17 0530 01/27/17 0600  BP:  119/82 113/78 112/76  Pulse:  65 63 63  Resp:  (!) 25 19 (!) 22  Temp:      TempSrc:      SpO2:  93% 93% 91%  Weight: 128.3 kg (282 lb 13.6 oz)     Height:        Intake/Output Summary (Last 24 hours) at 01/27/17 0648 Last data filed at 01/27/17 0600  Gross per 24 hour  Intake          2409.74 ml  Output                0 ml  Net          2409.74 ml   Filed Weights   01/26/17 1443 01/26/17 2130 01/27/17 0447  Weight: 133.4 kg (294 lb) 129.4 kg (285 lb 4.4 oz) 128.3 kg (282 lb 13.6 oz)     REVIEW OF SYSTEMS  As per history otherwise all reviewed and reported negative  Exam:  General exam: awake, alert, poorly groomed, appears chronically ill.  NAD. Cooperative.  Respiratory system: diffuse exp wheezing LUL. No increased work of breathing. Cardiovascular system: S1 & S2 heard, RRR. No JVD, murmurs, gallops, clicks or  pedal edema. Gastrointestinal system: Abdomen is nondistended, soft and mild tenderness RLQ. Normal bowel sounds heard. Central nervous system: Alert and oriented. No focal neurological deficits. Extremities: no CCE.  Data Reviewed: Basic Metabolic Panel:  Recent Labs Lab 01/26/17 1512 01/27/17 0501  NA 137 134*  K 3.3* 3.3*  CL 99* 101  CO2 26 26  GLUCOSE 120* 114*  BUN 21* 18  CREATININE 0.87 0.77  CALCIUM 9.4 8.3*   Liver Function Tests:  Recent Labs Lab 01/26/17 1512  AST 19  ALT 24  ALKPHOS 70  BILITOT 1.7*  PROT 7.4  ALBUMIN 3.6    Recent Labs Lab 01/26/17 1512  LIPASE 18   No results for input(s): AMMONIA in the last 168 hours. CBC:  Recent Labs Lab 01/26/17 1458 01/27/17 0501  WBC 19.2* 13.6*  HGB 15.9 14.5  HCT 46.1 43.4  MCV 85.2 86.1    PLT 210 210   Cardiac Enzymes:  Recent Labs Lab 01/26/17 1512  TROPONINI <0.03   CBG (last 3)  No results for input(s): GLUCAP in the last 72 hours. No results found for this or any previous visit (from the past 240 hour(s)).   Studies: Dg Chest 2 View  Result Date: 01/26/2017 CLINICAL DATA:  Irregular heartbeat.  Fatigue.  Anorexia. EXAM: CHEST  2 VIEW COMPARISON:  10/21/2014 FINDINGS: The heart size and mediastinal contours are within normal limits. No evidence of pulmonary consolidation or pleural effusion. Mild right lateral pleural thickening is stable. A new irregular masslike opacity is seen in the right upper lobe measuring approximately 3 cm. This is suspicious for neoplasm. IMPRESSION: New 3 mm irregular masslike opacity in right upper lobe, suspicious for neoplasm. Chest CT with contrast is recommended for further evaluation. Electronically Signed   By: Earle Gell M.D.   On: 01/26/2017 17:34   Ct Abdomen Pelvis W Contrast  Result Date: 01/26/2017 CLINICAL DATA:  Lower abdominal pain EXAM: CT ABDOMEN AND PELVIS WITH CONTRAST TECHNIQUE: Multidetector CT imaging of the abdomen and pelvis was performed using the standard protocol following bolus administration of intravenous contrast. CONTRAST:  118mL ISOVUE-300 IOPAMIDOL (ISOVUE-300) INJECTION 61% COMPARISON:  None. FINDINGS: Lower chest: Bases demonstrate no acute consolidation or pleural effusion. Borderline heart size. Mild coronary calcification. Hepatobiliary: No focal liver abnormality is seen. No gallstones, gallbladder wall thickening, or biliary dilatation. Pancreas: Unremarkable. No pancreatic ductal dilatation or surrounding inflammatory changes. Spleen: Normal in size without focal abnormality. Adrenals/Urinary Tract: Left adrenal gland appears normal. 2.8 cm enhancing mass in the right adrenal gland. Nonspecific perinephric fat stranding. 6 mm stone in the mid right kidney. 2.1 cm cyst in the right kidney. Punctate  nonobstructing stone in the mid left kidney. Bladder within normal limits. Stomach/Bowel: The stomach is collapsed. No dilated small bowel. Normal appendix. Diverticular disease of the colon, most heavily concentrated at the sigmoid colon which is redundant. Focal wall thickening and moderate surrounding inflammation at the distal sigmoid colon consistent with diverticulitis. Irregular gas collection adjacent to the sigmoid colon is suspect for contained perforation. No abscess. Vascular/Lymphatic: Extensive atherosclerotic calcifications of the aorta duct. 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 No significantly enlarged lymph nodes. Reproductive: Prostate is unremarkable. Other: Negative for free air or significant free fluid. Musculoskeletal: Degenerative changes of the spine. No acute osseous abnormality. IMPRESSION: 1. Focal wall thickening and moderate surrounding inflammation involving the distal sigmoid colon with diverticula present, findings would be most consistent with an acute diverticulitis. Suspected small extraluminal gas collection  adjacent to the inflamed sigmoid colon concerning for focal contained perforation. No free air or abscess. 2. 2.8 cm enhancing mass in the right adrenal gland, possible adenoma, however recommend non emergent adrenal CT protocol to evaluate 3. Nonobstructing stones within both kidneys. 4. Small aortic aneurysm measuring 3 cm. Recommend followup by ultrasound in 3 years. This recommendation follows ACR consensus guidelines: White Paper of the ACR Incidental Findings Committee II on Vascular Findings. [facility] [facility] Coll Radiol 2013; 10:789-794 Electronically Signed   By: Donavan Foil M.D.   On: 01/26/2017 17:30   Scheduled Meds: . atorvastatin  10 mg Oral q1800  . [START ON 01/28/2017] Influenza vac split quadrivalent PF  0.5 mL Intramuscular Tomorrow-1000  . pneumococcal 23 valent vaccine  0.5 mL Intramuscular Tomorrow-1000  . tamsulosin  0.4 mg  Oral Daily   Continuous Infusions: . 0.9 % NaCl with KCl 40 mEq / L 100 mL/hr (01/26/17 2246)  . ciprofloxacin 400 mg (01/27/17 0547)  . diltiazem (CARDIZEM) infusion 5 mg/hr (01/27/17 0240)  . metronidazole 500 mg (01/27/17 0240)    Principal Problem:   Atrial fibrillation with RVR ([facility]) Active Problems:   Diverticulitis of intestine with perforation   Mass of upper lobe of right lung   Critical Care Time spent: 40 mins  Irwin Brakeman, MD, FAAFP [facility] Pager [phone]  If 7PM-7AM, please contact night-coverage [email or web address] Password [email or web address] 01/27/2017, 6:48 AM    LOS: 1 day

## 2017-01-27 NOTE — Progress Notes (Signed)
*  PRELIMINARY RESULTS* Echocardiogram 2D Echocardiogram has been performed.  Aaron Sellers 01/27/2017, 12:35 PM

## 2017-01-27 NOTE — Progress Notes (Signed)
Called Dr. Arnoldo Morale for consult.

## 2017-01-28 ENCOUNTER — Encounter (HOSPITAL_COMMUNITY): Payer: Self-pay | Admitting: Adult Health

## 2017-01-28 DIAGNOSIS — I480 Paroxysmal atrial fibrillation: Secondary | ICD-10-CM

## 2017-01-28 DIAGNOSIS — I10 Essential (primary) hypertension: Secondary | ICD-10-CM

## 2017-01-28 DIAGNOSIS — R1031 Right lower quadrant pain: Secondary | ICD-10-CM

## 2017-01-28 LAB — COMPREHENSIVE METABOLIC PANEL
ALBUMIN: 3 g/dL — AB (ref 3.5–5.0)
ALK PHOS: 55 U/L (ref 38–126)
ALT: 21 U/L (ref 17–63)
AST: 15 U/L (ref 15–41)
Anion gap: 8 (ref 5–15)
BILIRUBIN TOTAL: 1 mg/dL (ref 0.3–1.2)
BUN: 10 mg/dL (ref 6–20)
CO2: 28 mmol/L (ref 22–32)
Calcium: 8.3 mg/dL — ABNORMAL LOW (ref 8.9–10.3)
Chloride: 99 mmol/L — ABNORMAL LOW (ref 101–111)
Creatinine, Ser: 0.8 mg/dL (ref 0.61–1.24)
GFR calc Af Amer: >60 mL/min (ref >60)
GFR calc non Af Amer: >60 mL/min (ref >60)
GLUCOSE: 105 mg/dL — AB (ref 65–99)
POTASSIUM: 3.8 mmol/L (ref 3.5–5.1)
SODIUM: 135 mmol/L (ref 135–145)
TOTAL PROTEIN: 6.1 g/dL — AB (ref 6.5–8.1)

## 2017-01-28 LAB — CBC WITH DIFFERENTIAL/PLATELET
Basophils Absolute: 0 10*3/uL (ref 0.0–0.1)
Basophils Relative: 0 %
EOS ABS: 0.2 10*3/uL (ref 0.0–0.7)
EOS PCT: 2 %
HCT: 42.2 % (ref 39.0–52.0)
Hemoglobin: 13.8 g/dL (ref 13.0–17.0)
LYMPHS ABS: 1.9 10*3/uL (ref 0.7–4.0)
Lymphocytes Relative: 16 %
MCH: 28.4 pg (ref 26.0–34.0)
MCHC: 32.7 g/dL (ref 30.0–36.0)
MCV: 86.8 fL (ref 78.0–100.0)
MONO ABS: 1.1 10*3/uL — AB (ref 0.1–1.0)
Monocytes Relative: 9 %
Neutro Abs: 9.1 10*3/uL — ABNORMAL HIGH (ref 1.7–7.7)
Neutrophils Relative %: 73 %
Platelets: 217 10*3/uL (ref 150–400)
RBC: 4.86 MIL/uL (ref 4.22–5.81)
RDW: 13.4 % (ref 11.5–15.5)
WBC: 12.3 10*3/uL — AB (ref 4.0–10.5)

## 2017-01-28 LAB — HIV ANTIBODY (ROUTINE TESTING W REFLEX): HIV SCREEN 4TH GENERATION: NONREACTIVE

## 2017-01-28 LAB — MAGNESIUM: Magnesium: 2.1 mg/dL (ref 1.7–2.4)

## 2017-01-28 MED ORDER — IPRATROPIUM-ALBUTEROL 0.5-2.5 (3) MG/3ML IN SOLN
3.0000 mL | Freq: Three times a day (TID) | RESPIRATORY_TRACT | Status: DC
  Administered 2017-01-28 – 2017-01-29 (×3): 3 mL via RESPIRATORY_TRACT
  Filled 2017-01-28 (×3): qty 3

## 2017-01-28 NOTE — Progress Notes (Signed)
PROGRESS NOTE    Aaron Sellers  DJS:[id number]  DOB: 1951/01/01  DOA: 01/26/2017 PCP: Sheryle Hail, PA-C   Brief Admission Hx: Aaron Sellers is a 66 y.o. male with a history of COPD, tobacco abuse, high cholesterol, hypertension not currently on medications.  Patient seen for 3 days of worsening abdominal pain in the right lower quadrant.  Pain is nonradiating, but accompanied by diminished appetite.  He denies nausea, vomiting, diarrhea, constipation, hematochezia, melena.  He has been feeling more tired over the past day or 2.  Patient went to urgent care and had an EKG done due to tachycardia.  Due to the arrhythmia, the patient was sent to the hospital for evaluation.  MDM/Assessment & Plan:   1. Atrial Flutter/AFib with RVR - Pt off the IV diltiazem, rate controlled with oral diltiazem.  Normal TSH noted.  Echocardiogram reveals EF 55-60% with no regional wall abnormalities.  He is not being anticoagulated now until can be seen by general surgery regarding the abdominal diverticular perforation.  I will ask for cardiology to see him for new onset atrial fibrillation.   2. Acute diverticulitis with perforation - Clinically improving abdominal exam.  Continue IV cipro/flagyl.  General surgery consultation requested.  He is tolerating clear liquids.  3. RUL Lung Mass - 3 mm lesion seen, CT chest with contrast pending.  I spoke to the patient about this at bedside.  He may need a pulmonary consult pending results of CT chest.   4. COPD - with active wheezing, continue scheduled duonebs. 5. Tobacco abuse - counseled on smoking cessation, will offer nicotine patch.    6. Leukocytosis - WBC trending down with IV antibiotics.   7. Hyponatremia - resolved with IV hydration with Normal Saline.   8. Hypokalemia - supplemented magnesium, and potassium was repleted.  9. BPH - resumed home flomax daily and monitor urine output, catheterize PRN if unable to void.    DVT prophylaxis: SCD and  ambulation  Code Status: full  Family Communication: none present Disposition Plan: TBD  Consultants:  General surgery  cardiology  Subjective: Pt reports that his abdomen feels a lot better today.   Objective: Vitals:   01/28/17 0300 01/28/17 0400 01/28/17 0500 01/28/17 0600  BP:  122/78  130/82  Pulse: 65 68 64 66  Resp: (!) 27 (!) 23 (!) 26 (!) 28  Temp:  98 F (36.7 C)    TempSrc:  Oral    SpO2: 96% 96% 94% 95%  Weight:   130.8 kg (288 lb 5.8 oz)   Height:        Intake/Output Summary (Last 24 hours) at 01/28/17 0715 Last data filed at 01/28/17 0630  Gross per 24 hour  Intake          4057.17 ml  Output             2050 ml  Net          2007.17 ml   Filed Weights   01/26/17 2130 01/27/17 0447 01/28/17 0500  Weight: 129.4 kg (285 lb 4.4 oz) 128.3 kg (282 lb 13.6 oz) 130.8 kg (288 lb 5.8 oz)   REVIEW OF SYSTEMS  As per history otherwise all reviewed and reported negative  Exam:  General exam: awake, alert, poorly groomed, appears chronically ill.  NAD. Cooperative.  Respiratory system: diffuse exp wheezing LUL. No increased work of breathing. Cardiovascular system: S1 & S2 heard, RRR. No JVD, murmurs, gallops, clicks or pedal edema. Gastrointestinal system: Abdomen is  nondistended, soft and mild tenderness RLQ. Normal bowel sounds heard. Central nervous system: Alert and oriented. No focal neurological deficits. Extremities: no CCE.  Data Reviewed: Basic Metabolic Panel:  Recent Labs Lab 01/26/17 1512 01/27/17 0501 01/28/17 0411  NA 137 134* 135  K 3.3* 3.3* 3.8  CL 99* 101 99*  CO2 26 26 28   GLUCOSE 120* 114* 105*  BUN 21* 18 10  CREATININE 0.87 0.77 0.80  CALCIUM 9.4 8.3* 8.3*  MG  --  1.7 2.1   Liver Function Tests:  Recent Labs Lab 01/26/17 1512 01/28/17 0411  AST 19 15  ALT 24 21  ALKPHOS 70 55  BILITOT 1.7* 1.0  PROT 7.4 6.1*  ALBUMIN 3.6 3.0*    Recent Labs Lab 01/26/17 1512  LIPASE 18   No results for input(s): AMMONIA  in the last 168 hours. CBC:  Recent Labs Lab 01/26/17 1458 01/27/17 0501 01/28/17 0411  WBC 19.2* 13.6* 12.3*  NEUTROABS  --   --  9.1*  HGB 15.9 14.5 13.8  HCT 46.1 43.4 42.2  MCV 85.2 86.1 86.8  PLT 210 210 217   Cardiac Enzymes:  Recent Labs Lab 01/26/17 1512  TROPONINI <0.03   CBG (last 3)  No results for input(s): GLUCAP in the last 72 hours. Recent Results (from the past 240 hour(s))  MRSA PCR Screening     Status: None   Collection Time: 01/26/17  9:25 PM  Result Value Ref Range Status   MRSA by PCR NEGATIVE NEGATIVE Final    Comment:        The GeneXpert MRSA Assay (FDA approved for NASAL specimens only), is one component of a comprehensive MRSA colonization surveillance program. It is not intended to diagnose MRSA infection nor to guide or monitor treatment for MRSA infections.      Studies: Dg Chest 2 View  Result Date: 01/26/2017 CLINICAL DATA:  Irregular heartbeat.  Fatigue.  Anorexia. EXAM: CHEST  2 VIEW COMPARISON:  10/21/2014 FINDINGS: The heart size and mediastinal contours are within normal limits. No evidence of pulmonary consolidation or pleural effusion. Mild right lateral pleural thickening is stable. A new irregular masslike opacity is seen in the right upper lobe measuring approximately 3 cm. This is suspicious for neoplasm. IMPRESSION: New 3 mm irregular masslike opacity in right upper lobe, suspicious for neoplasm. Chest CT with contrast is recommended for further evaluation. Electronically Signed   By: Earle Gell M.D.   On: 01/26/2017 17:34   Ct Abdomen Pelvis W Contrast  Result Date: 01/26/2017 CLINICAL DATA:  Lower abdominal pain EXAM: CT ABDOMEN AND PELVIS WITH CONTRAST TECHNIQUE: Multidetector CT imaging of the abdomen and pelvis was performed using the standard protocol following bolus administration of intravenous contrast. CONTRAST:  146mL ISOVUE-300 IOPAMIDOL (ISOVUE-300) INJECTION 61% COMPARISON:  None. FINDINGS: Lower chest:  Bases demonstrate no acute consolidation or pleural effusion. Borderline heart size. Mild coronary calcification. Hepatobiliary: No focal liver abnormality is seen. No gallstones, gallbladder wall thickening, or biliary dilatation. Pancreas: Unremarkable. No pancreatic ductal dilatation or surrounding inflammatory changes. Spleen: Normal in size without focal abnormality. Adrenals/Urinary Tract: Left adrenal gland appears normal. 2.8 cm enhancing mass in the right adrenal gland. Nonspecific perinephric fat stranding. 6 mm stone in the mid right kidney. 2.1 cm cyst in the right kidney. Punctate nonobstructing stone in the mid left kidney. Bladder within normal limits. Stomach/Bowel: The stomach is collapsed. No dilated small bowel. Normal appendix. Diverticular disease of the colon, most heavily concentrated at the sigmoid colon which  is redundant. Focal wall thickening and moderate surrounding inflammation at the distal sigmoid colon consistent with diverticulitis. Irregular gas collection adjacent to the sigmoid colon is suspect for contained perforation. No abscess. Vascular/Lymphatic: Extensive atherosclerotic calcifications of the aorta duct. 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 No significantly enlarged lymph nodes. Reproductive: Prostate is unremarkable. Other: Negative for free air or significant free fluid. Musculoskeletal: Degenerative changes of the spine. No acute osseous abnormality. IMPRESSION: 1. Focal wall thickening and moderate surrounding inflammation involving the distal sigmoid colon with diverticula present, findings would be most consistent with an acute diverticulitis. Suspected small extraluminal gas collection adjacent to the inflamed sigmoid colon concerning for focal contained perforation. No free air or abscess. 2. 2.8 cm enhancing mass in the right adrenal gland, possible adenoma, however recommend non emergent adrenal CT protocol to evaluate 3.  Nonobstructing stones within both kidneys. 4. Small aortic aneurysm measuring 3 cm. Recommend followup by ultrasound in 3 years. This recommendation follows ACR consensus guidelines: White Paper of the ACR Incidental Findings Committee II on Vascular Findings. Natasha [employer] Coll Radiol 2013; 10:789-794 Electronically Signed   By: Donavan Foil M.D.   On: 01/26/2017 17:30   Scheduled Meds: . atorvastatin  10 mg Oral q1800  . diltiazem  60 mg Oral Q8H  . Influenza vac split quadrivalent PF  0.5 mL Intramuscular Tomorrow-1000  . ipratropium-albuterol  3 mL Nebulization Q6H  . nicotine  21 mg Transdermal Daily  . tamsulosin  0.4 mg Oral Daily   Continuous Infusions: . 0.9 % NaCl with KCl 40 mEq / L 100 mL/hr (01/28/17 0153)  . ciprofloxacin 400 mg (01/28/17 0630)  . metronidazole Stopped (01/28/17 0250)    Principal Problem:   Atrial fibrillation with RVR (HCC) Active Problems:   Diverticulitis of intestine with perforation   Mass of upper lobe of right lung   Adrenal mass (HCC)   Tobacco abuse   COPD (chronic obstructive pulmonary disease) (HCC)   Leukocytosis   RLQ abdominal pain   Hypokalemia   Chronic kidney disease   Hyponatremia   Hypertension   High cholesterol   BPH (benign prostatic hypertrophy) with urinary retention   Critical Care Time spent: 45 mins  Irwin Brakeman, MD, FAAFP [facility] Pager [phone]  If 7PM-7AM, please contact night-coverage [email or web address] Password [email or web address] 01/28/2017, 7:15 AM    LOS: 2 days

## 2017-01-28 NOTE — Consult Note (Signed)
Cardiology Consultation:   Patient ID: Aaron Sellers; [id number]; January 05, 1951   Admit date: 01/26/2017 Date of Consult: 01/28/2017  Primary Care Provider: Sheryle Hail, PA-C Primary Cardiologist: Ladona Horns MD  Patient Profile:   Aaron Sellers is a 66 y.o. male with a hx of  COPD, ongoing tobacco abuse, hypercholesterolemia, essential hypertension, who is being seen today for the evaluation of new onset atrial fib with RVR at the request of Dr. Wynetta Emery, Hospitalist.   History of Present Illness:   Aaron Sellers with no prior cardiac history, presented to the ER with complaints of abdominal pain, localized to the right lower quadrant, with associated diarrhea, fatigue and anorexia. The progressed over 4-5 days. He was seen at an Urgent Care facility in [place], and was noted to be tachycardic and was advised to go to [facility].   Vital signs on arrival to ER, HR 156 bpm, BP 114/99. EKG revealed atrial flutter with 2:1 AV block and frequent PVC's after administration of adenosine. He was started on diltiazem gtt and subsequently converted back to NSR. He is now on po diltiazem 60 mg Q 8 hours.   Pertinent labs on admission to ER: Na 137, K+3.3, CL 99, CO2 26, BUN 0.87, Creatinine 0.87. Troponin <0.03, TC 133, HDL 33, LDL 85, TG 77. WBC 19.2, Hgb 15.9. Hct 45.1. PLTS:  210.   CT scan of the abdomen determined that he had diverticulitis with a possible contained perforation. Surgical consultation is pending, and therefore anticoagulation has not been administered. He was also found to have a right upper lung mass of 3 mm in size, pulmonary consult is pending.    He is currently feeling much better, without further complaints of abdominal pain or diarrhea.   Past Medical History:  Diagnosis Date  . Arthritis   . BPH (benign prostatic hypertrophy) with urinary retention   . Chronic kidney disease    hx of kidney stones  2011  . COPD (chronic obstructive pulmonary disease) ([place])    has  been smoking since he was 22 ish--  . High cholesterol   . Hypertension    dx around 2011    Past Surgical History:  Procedure Laterality Date  . HERNIA REPAIR     umbilical hernia  04/173  . INCISION AND DRAINAGE ABSCESS     "down the middle" of his buttocks  2015  . TOTAL KNEE ARTHROPLASTY Left 10/29/2014   Procedure: TOTAL KNEE ARTHROPLASTY;  Surgeon: Dorna Leitz, MD;  Location: [facility];  Service: Orthopedics;  Laterality: Left;     Home Medications:  Prior to Admission medications   Medication Sig Start Date End Date Taking? Authorizing Provider  amLODipine (NORVASC) 5 MG tablet Take 5 mg by mouth daily.   Yes [provider]  atorvastatin (LIPITOR) 10 MG tablet Take 10 mg by mouth daily.   Yes [provider]  famotidine (PEPCID) 10 MG tablet Take 10 mg by mouth as needed for heartburn or indigestion.   Yes [provider]  hydrochlorothiazide (HYDRODIURIL) 25 MG tablet Take 25 mg by mouth daily.   Yes [provider]  ibuprofen (ADVIL,MOTRIN) 200 MG tablet Take 600-800 mg by mouth every 6 (six) hours as needed for mild pain.    Yes [provider]  tamsulosin (FLOMAX) 0.4 MG CAPS capsule Take 0.4 mg by mouth daily.   Yes [provider]    Inpatient Medications: Scheduled Meds: . atorvastatin  10 mg Oral q1800  . diltiazem  60 mg  Oral Q8H  . Influenza vac split quadrivalent PF  0.5 mL Intramuscular Tomorrow-1000  . ipratropium-albuterol  3 mL Nebulization TID  . nicotine  21 mg Transdermal Daily  . tamsulosin  0.4 mg Oral Daily   Continuous Infusions: . 0.9 % NaCl with KCl 40 mEq / L 100 mL/hr (01/28/17 0153)  . ciprofloxacin Stopped (01/28/17 0730)  . metronidazole Stopped (01/28/17 0250)   PRN Meds: acetaminophen, famotidine, ondansetron (ZOFRAN) IV  Allergies:    Allergies  Allergen Reactions  . Adhesive [Tape] Other (See Comments)    Skin peels  . Codeine Other (See Comments)    Jitters, "skin crawling"     Social History:   Social History   Social History  . Marital status: Single    Spouse name: N/A  . Number of children: N/A  . Years of education: N/A   Occupational History  . Not on file.   Social History Main Topics  . Smoking status: Current Every Day Smoker    Packs/day: 1.00    Years: 40.00    Types: Cigarettes  . Smokeless tobacco: Former Systems developer    Types: Snuff  . Alcohol use No  . Drug use: No  . Sexual activity: Not on file   Other Topics Concern  . Not on file   Social History Narrative  . No narrative on file    Family History:    Family History  Problem Relation Age of Onset  . Hypertension Mother   . CVA Father   . Lung cancer Sister   . Lung cancer Brother   . Hypertension Brother   . Hypertension Sister      ROS:  Please see the history of present illness.  ROS  All other ROS reviewed and negative.     Physical Exam/Data:   Vitals:   01/28/17 0400 01/28/17 0500 01/28/17 0600 01/28/17 0733  BP: 122/78  130/82   Pulse: 68 64 66   Resp: (!) 23 (!) 26 (!) 28   Temp: 98 F (36.7 C)     TempSrc: Oral     SpO2: 96% 94% 95% 98%  Weight:  288 lb 5.8 oz (130.8 kg)    Height:        Intake/Output Summary (Last 24 hours) at 01/28/17 0808 Last data filed at 01/28/17 0723  Gross per 24 hour  Intake          3847.17 ml  Output             2625 ml  Net          1222.17 ml   Filed Weights   01/26/17 2130 01/27/17 0447 01/28/17 0500  Weight: 285 lb 4.4 oz (129.4 kg) 282 lb 13.6 oz (128.3 kg) 288 lb 5.8 oz (130.8 kg)   Body mass index is 32.49 kg/m.  General:  Well nourished, well developed, in no acute distress HEENT: normal Lymph: no adenopathy Neck: no JVD Endocrine:  No thryomegaly Vascular: No carotid bruits; FA pulses 2+ bilaterally without bruits  Cardiac:  normal S1, S2; RRR; no murmur  Lungs:  Inspiratory and expiratory crackles. Some clearing with coughing. No wheezes.  Abd: soft, nontender, no hepatomegaly Bowel sounds are  hypoactive.  Ext: no edema Musculoskeletal:  No deformities, BUE and BLE strength normal and equal. Medial aspect of upper right thigh has area of enlargement, painful to palpation, uncertain if lipoma.  Skin: warm and dry  Neuro:  CNs 2-12 intact, no focal abnormalities noted Psych:  Normal affect   EKG:  The EKG was personally reviewed and demonstrates:   1. NO:[facility] flutter with occasional PVC f 156 bpm (10/27) 2. After Beginning diltiazem: Sinus rhythm with  PAC's  80 bpm  (10/27) 3. Follow up 01/28/2017: NSR rate of 61 bpm.   Telemetry:  Telemetry was personally reviewed and demonstrates:  NSR  Relevant CV Studies: Echocardiogram 01/27/2017.  Left ventricle: The cavity size was normal. Wall thickness was   normal. Systolic function was normal. The estimated ejection   fraction was in the range of 55% to 60%. Wall motion was normal;   there were no regional wall motion abnormalities. Left   ventricular diastolic function parameters were normal. - Aortic valve: There was trivial regurgitation.  Laboratory Data:  Chemistry  Recent Labs Lab 01/26/17 1512 01/27/17 0501 01/28/17 0411  NA 137 134* 135  K 3.3* 3.3* 3.8  CL 99* 101 99*  CO2 26 26 28   GLUCOSE 120* 114* 105*  BUN 21* 18 10  CREATININE 0.87 0.77 0.80  CALCIUM 9.4 8.3* 8.3*  GFRNONAA >60 >60 >60  GFRAA >60 >60 >60  ANIONGAP 12 7 8      Recent Labs Lab 01/26/17 1512 01/28/17 0411  PROT 7.4 6.1*  ALBUMIN 3.6 3.0*  AST 19 15  ALT 24 21  ALKPHOS 70 55  BILITOT 1.7* 1.0   Hematology  Recent Labs Lab 01/26/17 1458 01/27/17 0501 01/28/17 0411  WBC 19.2* 13.6* 12.3*  RBC 5.41 5.04 4.86  HGB 15.9 14.5 13.8  HCT 46.1 43.4 42.2  MCV 85.2 86.1 86.8  MCH 29.4 28.8 28.4  MCHC 34.5 33.4 32.7  RDW 13.2 13.3 13.4  PLT 210 210 217   Cardiac Enzymes  Recent Labs Lab 01/26/17 1512  TROPONINI <0.03   Radiology/Studies:  Dg Chest 2 View  Result Date: 01/26/2017 CLINICAL DATA:  Irregular  heartbeat.  Fatigue.  Anorexia. EXAM: CHEST  2 VIEW COMPARISON:  10/21/2014 FINDINGS: The heart size and mediastinal contours are within normal limits. No evidence of pulmonary consolidation or pleural effusion. Mild right lateral pleural thickening is stable. A new irregular masslike opacity is seen in the right upper lobe measuring approximately 3 cm. This is suspicious for neoplasm. IMPRESSION: New 3 mm irregular masslike opacity in right upper lobe, suspicious for neoplasm. Chest CT with contrast is recommended for further evaluation. Electronically Signed   By: Earle Gell M.D.   On: 01/26/2017 17:34   Ct Chest W Contrast  Result Date: 01/28/2017 CLINICAL DATA:  Right upper lobe pulmonary nodule on chest radiograph. EXAM: CT CHEST WITH CONTRAST TECHNIQUE: Multidetector CT imaging of the chest was performed during intravenous contrast administration. CONTRAST:  10mL ISOVUE-300 IOPAMIDOL (ISOVUE-300) INJECTION 61% COMPARISON:  Chest radiograph of 01/26/2017. FINDINGS: Cardiovascular: Aortic and branch vessel atherosclerosis. Normal heart size, without pericardial effusion. Lad and right coronary artery atherosclerosis. No central pulmonary embolism, on this non-dedicated study. Mediastinum/Nodes: No supraclavicular adenopathy. Right paratracheal adenopathy 1.5 cm on image 66/series 2. Adenopathy within the azygoesophageal recess measures 1.4 cm on image 81/series 2. Right hilar adenopathy at 3.0 cm on image 75/series 2. Lungs/Pleura: No pleural fluid. Corresponding to the plain film abnormality, within the right upper lobe laterally, is a spiculated mass which measures 3.3 by 2.7 cm on image 51/ series 4. This contacts the pleural surface laterally. 3 mm vague left upper lobe pulmonary nodule on image 71/series 4. Minimal dependent posterior left upper lobe atelectasis. Upper Abdomen: Moderate hepatic steatosis. Normal imaged portions of the spleen, stomach,  pancreas, gallbladder, kidneys. Mild left adrenal  thickening. Right adrenal nodularity, with the most well-defined component measuring maximally 2.6 cm on image 158/ series 2. Abdominal aortic and branch vessel atherosclerosis. Musculoskeletal: Moderate upper thoracic spondylosis. IMPRESSION: 1. Right upper lobe lung mass, consistent with primary bronchogenic carcinoma. 2. Mediastinal and right hilar adenopathy, consistent with nodal metastasis. 3. Right adrenal nodularity which is indeterminate. Cannot exclude metastatic disease. 4. Consider nonemergent/outpatient PET for further staging. 5. Coronary artery atherosclerosis. Aortic Atherosclerosis (ICD10-I70.0). 6. Hepatic steatosis. Electronically Signed   By: Abigail Miyamoto M.D.   On: 01/28/2017 07:51   Ct Abdomen Pelvis W Contrast  Result Date: 01/26/2017 CLINICAL DATA:  Lower abdominal pain EXAM: CT ABDOMEN AND PELVIS WITH CONTRAST TECHNIQUE: Multidetector CT imaging of the abdomen and pelvis was performed using the standard protocol following bolus administration of intravenous contrast. CONTRAST:  174mL ISOVUE-300 IOPAMIDOL (ISOVUE-300) INJECTION 61% COMPARISON:  None. FINDINGS: Lower chest: Bases demonstrate no acute consolidation or pleural effusion. Borderline heart size. Mild coronary calcification. Hepatobiliary: No focal liver abnormality is seen. No gallstones, gallbladder wall thickening, or biliary dilatation. Pancreas: Unremarkable. No pancreatic ductal dilatation or surrounding inflammatory changes. Spleen: Normal in size without focal abnormality. Adrenals/Urinary Tract: Left adrenal gland appears normal. 2.8 cm enhancing mass in the right adrenal gland. Nonspecific perinephric fat stranding. 6 mm stone in the mid right kidney. 2.1 cm cyst in the right kidney. Punctate nonobstructing stone in the mid left kidney. Bladder within normal limits. Stomach/Bowel: The stomach is collapsed. No dilated small bowel. Normal appendix. Diverticular disease of the colon, most heavily concentrated at the  sigmoid colon which is redundant. Focal wall thickening and moderate surrounding inflammation at the distal sigmoid colon consistent with diverticulitis. Irregular gas collection adjacent to the sigmoid colon is suspect for contained perforation. No abscess. Vascular/Lymphatic: Extensive atherosclerotic calcifications of the aorta duct. 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 No significantly enlarged lymph nodes. Reproductive: Prostate is unremarkable. Other: Negative for free air or significant free fluid. Musculoskeletal: Degenerative changes of the spine. No acute osseous abnormality. IMPRESSION: 1. Focal wall thickening and moderate surrounding inflammation involving the distal sigmoid colon with diverticula present, findings would be most consistent with an acute diverticulitis. Suspected small extraluminal gas collection adjacent to the inflamed sigmoid colon concerning for focal contained perforation. No free air or abscess. 2. 2.8 cm enhancing mass in the right adrenal gland, possible adenoma, however recommend non emergent adrenal CT protocol to evaluate 3. Nonobstructing stones within both kidneys. 4. Small aortic aneurysm measuring 3 cm. Recommend followup by ultrasound in 3 years. This recommendation follows ACR consensus guidelines: White Paper of the ACR Incidental Findings Committee II on Vascular Findings. Natasha [employer] Coll Radiol 2013; 10:[id number] Electronically Signed   By: Donavan Foil M.D.   On: 01/26/2017 17:30    Assessment and Plan:   1.Paroxysmal Atrial fib with RVR: Likely from physiologic stress in the setting of acute diverticulitis and possible perforation. He  converted to NSR on diltiazem gtt initially and is now on po at 60 mg Q 8 hours. CHADS VASC Score of 2 with annual risk of stroke, 2.2%. With GI issues and pulmonary findings I would not anticoagulate now  I would follow on tele Would get echo to evaluate atrial and LV/RV    2. Hypertension: Currently  well controlled on diltiazem. Has not been on antihypertensives at home though was prescribed amlodipine    3. Abdominal Pain: Diverticulitis is noted along with possible perforation. No  plans for surgery at this time. Currently pain free  Abdomen soft    4. Small Infrarenal aneurysm: Noted incidentally on CT of the abdomen measured at 3 mm.   5.  Right Upper Lobs Lung Mass: 3.3 x 2.7 cm. Worrisome for bronchiogenic carcinoma. Consider pulmonary consult with biopsy.   6  Tob abuse  Needs to quit smoking    For questions or updates, please contact [facility] Please consult [email or web address] for contact info under Cardiology/STEMI.   Signed, Jory Sims, NP  01/28/2017 8:08 AM   Pt seen and examined  I have amended note above by Arnold Long to  Reflect my findings Pt is a 66 yo presents with abdominal pain  Found to be in atrial flutter with 2:1 AV conduction  Converted quickly on own once dilt added.  No recurrence  May be related to physiologic stress from acute medical problems  ON exam:  Pt comfortable  Neck:  JVP normal  Lungs Clear with cough  Cardiac RRR  NoS3  No murmurs  Abd  Obese  Benign  Ext without edema  Rec:   I would keep on diltiazem tid for now.  He is taking PO I would get and echo   His CHADSVASC score is 2 (HTN and atherosclerosis)   With GI issues and poss pulmonary issues I would not anticoag unless stabilizes and has recurrenced   Follow on tele  HTN  Follow BP Hx atherosclerosis on CT  No angina  Will continue to follow   Check lipids in AM    Dorris Carnes

## 2017-01-28 NOTE — Consult Note (Signed)
Consult requested by: [facility], Dr. Wynetta Emery Consult requested for: Lung mass  HPI: This is a 66 year old with known history of COPD ongoing tobacco abuse hypertension and hyperlipidemia who came to the emergency department with abdominal pain. He had apparently gone to an urgent care facility in [facility] was noted to be tachycardic and was advised to come to the hospital. He had right lower quadrant abdominal pain with diarrhea fatigue poor appetite but no fever. When he came to the emergency department he was noted to be in atrial flutter with 21 block with frequent PVCs. He was given adenosine and then started on a Cardizem drip and has converted to sinus rhythm and now is on oral Cardizem. CT of the abdomen done in the emergency department showed that he had diverticulitis and a possible contained perforation. He also had a right upper lung mass 3 cm which is close to and abuts the pleura. He says he gets short of breath sometimes. His significant other says that he wheezes more when he is smoking. He smokes about a package of cigarettes daily. He has apparently stopped smoking on multiple occasions but has restarted. He denies any abdominal pain now. No hemoptysis weight loss no cough or sputum production. No chest pain.  Past Medical History:  Diagnosis Date  . Arthritis   . BPH (benign prostatic hypertrophy) with urinary retention   . Chronic kidney disease    hx of kidney stones  2011  . COPD (chronic obstructive pulmonary disease) ([place])    has been smoking since he was 22 ish--  . High cholesterol   . Hypertension    dx around 2011     Family History  Problem Relation Age of Onset  . Hypertension Mother   . CVA Father   . Lung cancer Sister   . Lung cancer Brother   . Hypertension Brother   . Hypertension Sister      Social History   Social History  . Marital status: Single    Spouse name: N/A  . Number of children: N/A  . Years of education: N/A    Social History Main Topics  . Smoking status: Current Every Day Smoker    Packs/day: 1.00    Years: 40.00    Types: Cigarettes  . Smokeless tobacco: Former Systems developer    Types: Snuff  . Alcohol use No  . Drug use: No  . Sexual activity: Not Asked   Other Topics Concern  . None   Social History Narrative  . None     ROS: Except as mentioned 10 point review of systems is negative    Objective: Vital signs in last 24 hours: Temp:  [97.6 F (36.4 C)-98.9 F (37.2 C)] 98.5 F (36.9 C) (10/29 0800) Pulse Rate:  [54-72] 65 (10/29 0800) Resp:  [18-30] 27 (10/29 0800) BP: (110-131)/(64-86) 125/86 (10/29 0800) SpO2:  [93 %-98 %] 97 % (10/29 0800) Weight:  [130.8 kg (288 lb 5.8 oz)] 130.8 kg (288 lb 5.8 oz) (10/29 0500) Weight change: -2.557 kg (-5 lb 10.2 oz) Last BM Date: 01/26/17  Intake/Output from previous day: 10/28 0701 - 10/29 0700 In: 4057.2 [P.O.:860; I.V.:2497.2; IV Piggyback:700] Out: 2050 [Urine:2050]  PHYSICAL EXAM Constitutional: He is awake and alert and in no acute distress. Eyes: Pupils reactive EOMI. Ears nose mouth and throat: His mucous membranes are moist. Hearing is grossly normal. His throat is clear. Cardiovascular: His heart is regular now with no gallop. No edema. Respiratory: His respiratory effort is  normal. He has scattered rhonchi on lung exam. Gastrointestinal: His abdomen is soft with no masses and he is tender in the right lower quadrant but this is mild. Skin: Warm and dry musculoskeletal: Normal strength. Neurological: No focal abnormalities. Psychiatric: Normal mood and affect  Lab Results: Basic Metabolic Panel:  Recent Labs  01/27/17 0501 01/28/17 0411  NA 134* 135  K 3.3* 3.8  CL 101 99*  CO2 26 28  GLUCOSE 114* 105*  BUN 18 10  CREATININE 0.77 0.80  CALCIUM 8.3* 8.3*  MG 1.7 2.1   Liver Function Tests:  Recent Labs  01/26/17 1512 01/28/17 0411  AST 19 15  ALT 24 21  ALKPHOS 70 55  BILITOT 1.7* 1.0  PROT 7.4 6.1*   ALBUMIN 3.6 3.0*    Recent Labs  01/26/17 1512  LIPASE 18   No results for input(s): AMMONIA in the last 72 hours. CBC:  Recent Labs  01/27/17 0501 01/28/17 0411  WBC 13.6* 12.3*  NEUTROABS  --  9.1*  HGB 14.5 13.8  HCT 43.4 42.2  MCV 86.1 86.8  PLT 210 217   Cardiac Enzymes:  Recent Labs  01/26/17 1512  TROPONINI <0.03   BNP: No results for input(s): PROBNP in the last 72 hours. D-Dimer: No results for input(s): DDIMER in the last 72 hours. CBG: No results for input(s): GLUCAP in the last 72 hours. Hemoglobin A1C: No results for input(s): HGBA1C in the last 72 hours. Fasting Lipid Panel:  Recent Labs  01/27/17 0502  CHOL 133  HDL 33*  LDLCALC 85  TRIG 77  CHOLHDL 4.0   Thyroid Function Tests:  Recent Labs  01/27/17 0502  TSH 2.088  FREET4 1.12   Anemia Panel: No results for input(s): VITAMINB12, FOLATE, FERRITIN, TIBC, IRON, RETICCTPCT in the last 72 hours. Coagulation: No results for input(s): LABPROT, INR in the last 72 hours. Urine Drug Screen: Drugs of Abuse  No results found for: LABOPIA, COCAINSCRNUR, LABBENZ, AMPHETMU, THCU, LABBARB  Alcohol Level: No results for input(s): ETH in the last 72 hours. Urinalysis:  Recent Labs  01/26/17 1512  COLORURINE YELLOW  LABSPEC 1.026  PHURINE 7.0  GLUCOSEU NEGATIVE  HGBUR SMALL*  BILIRUBINUR NEGATIVE  KETONESUR 5*  PROTEINUR NEGATIVE  NITRITE NEGATIVE  LEUKOCYTESUR NEGATIVE   Misc. Labs:   ABGS: No results for input(s): PHART, PO2ART, TCO2, HCO3 in the last 72 hours.  Invalid input(s): PCO2   MICROBIOLOGY: Recent Results (from the past 240 hour(s))  MRSA PCR Screening     Status: None   Collection Time: 01/26/17  9:25 PM  Result Value Ref Range Status   MRSA by PCR NEGATIVE NEGATIVE Final    Comment:        The GeneXpert MRSA Assay (FDA approved for NASAL specimens only), is one component of a comprehensive MRSA colonization surveillance program. It is not intended to  diagnose MRSA infection nor to guide or monitor treatment for MRSA infections.     Studies/Results: Dg Chest 2 View  Result Date: 01/26/2017 CLINICAL DATA:  Irregular heartbeat.  Fatigue.  Anorexia. EXAM: CHEST  2 VIEW COMPARISON:  10/21/2014 FINDINGS: The heart size and mediastinal contours are within normal limits. No evidence of pulmonary consolidation or pleural effusion. Mild right lateral pleural thickening is stable. A new irregular masslike opacity is seen in the right upper lobe measuring approximately 3 cm. This is suspicious for neoplasm. IMPRESSION: New 3 mm irregular masslike opacity in right upper lobe, suspicious for neoplasm. Chest CT with contrast  is recommended for further evaluation. Electronically Signed   By: Earle Gell M.D.   On: 01/26/2017 17:34   Ct Chest W Contrast  Result Date: 01/28/2017 CLINICAL DATA:  Right upper lobe pulmonary nodule on chest radiograph. EXAM: CT CHEST WITH CONTRAST TECHNIQUE: Multidetector CT imaging of the chest was performed during intravenous contrast administration. CONTRAST:  24mL ISOVUE-300 IOPAMIDOL (ISOVUE-300) INJECTION 61% COMPARISON:  Chest radiograph of 01/26/2017. FINDINGS: Cardiovascular: Aortic and branch vessel atherosclerosis. Normal heart size, without pericardial effusion. Lad and right coronary artery atherosclerosis. No central pulmonary embolism, on this non-dedicated study. Mediastinum/Nodes: No supraclavicular adenopathy. Right paratracheal adenopathy 1.5 cm on image 66/series 2. Adenopathy within the azygoesophageal recess measures 1.4 cm on image 81/series 2. Right hilar adenopathy at 3.0 cm on image 75/series 2. Lungs/Pleura: No pleural fluid. Corresponding to the plain film abnormality, within the right upper lobe laterally, is a spiculated mass which measures 3.3 by 2.7 cm on image 51/ series 4. This contacts the pleural surface laterally. 3 mm vague left upper lobe pulmonary nodule on image 71/series 4. Minimal dependent  posterior left upper lobe atelectasis. Upper Abdomen: Moderate hepatic steatosis. Normal imaged portions of the spleen, stomach, pancreas, gallbladder, kidneys. Mild left adrenal thickening. Right adrenal nodularity, with the most well-defined component measuring maximally 2.6 cm on image 158/ series 2. Abdominal aortic and branch vessel atherosclerosis. Musculoskeletal: Moderate upper thoracic spondylosis. IMPRESSION: 1. Right upper lobe lung mass, consistent with primary bronchogenic carcinoma. 2. Mediastinal and right hilar adenopathy, consistent with nodal metastasis. 3. Right adrenal nodularity which is indeterminate. Cannot exclude metastatic disease. 4. Consider nonemergent/outpatient PET for further staging. 5. Coronary artery atherosclerosis. Aortic Atherosclerosis (ICD10-I70.0). 6. Hepatic steatosis. Electronically Signed   By: Abigail Miyamoto M.D.   On: 01/28/2017 07:51   Ct Abdomen Pelvis W Contrast  Result Date: 01/26/2017 CLINICAL DATA:  Lower abdominal pain EXAM: CT ABDOMEN AND PELVIS WITH CONTRAST TECHNIQUE: Multidetector CT imaging of the abdomen and pelvis was performed using the standard protocol following bolus administration of intravenous contrast. CONTRAST:  154mL ISOVUE-300 IOPAMIDOL (ISOVUE-300) INJECTION 61% COMPARISON:  None. FINDINGS: Lower chest: Bases demonstrate no acute consolidation or pleural effusion. Borderline heart size. Mild coronary calcification. Hepatobiliary: No focal liver abnormality is seen. No gallstones, gallbladder wall thickening, or biliary dilatation. Pancreas: Unremarkable. No pancreatic ductal dilatation or surrounding inflammatory changes. Spleen: Normal in size without focal abnormality. Adrenals/Urinary Tract: Left adrenal gland appears normal. 2.8 cm enhancing mass in the right adrenal gland. Nonspecific perinephric fat stranding. 6 mm stone in the mid right kidney. 2.1 cm cyst in the right kidney. Punctate nonobstructing stone in the mid left kidney.  Bladder within normal limits. Stomach/Bowel: The stomach is collapsed. No dilated small bowel. Normal appendix. Diverticular disease of the colon, most heavily concentrated at the sigmoid colon which is redundant. Focal wall thickening and moderate surrounding inflammation at the distal sigmoid colon consistent with diverticulitis. Irregular gas collection adjacent to the sigmoid colon is suspect for contained perforation. No abscess. Vascular/Lymphatic: Extensive atherosclerotic calcifications of the aorta duct. 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 No significantly enlarged lymph nodes. Reproductive: Prostate is unremarkable. Other: Negative for free air or significant free fluid. Musculoskeletal: Degenerative changes of the spine. No acute osseous abnormality. IMPRESSION: 1. Focal wall thickening and moderate surrounding inflammation involving the distal sigmoid colon with diverticula present, findings would be most consistent with an acute diverticulitis. Suspected small extraluminal gas collection adjacent to the inflamed sigmoid colon concerning for focal contained perforation.  No free air or abscess. 2. 2.8 cm enhancing mass in the right adrenal gland, possible adenoma, however recommend non emergent adrenal CT protocol to evaluate 3. Nonobstructing stones within both kidneys. 4. Small aortic aneurysm measuring 3 cm. Recommend followup by ultrasound in 3 years. This recommendation follows ACR consensus guidelines: White Paper of the ACR Incidental Findings Committee II on Vascular Findings. Natasha [employer] Coll Radiol 2013; 10:789-794 Electronically Signed   By: Donavan Foil M.D.   On: 01/26/2017 17:30    Medications:  Prior to Admission:  Prescriptions Prior to Admission  Medication Sig Dispense Refill Last Dose  . amLODipine (NORVASC) 5 MG tablet Take 5 mg by mouth daily.   01/26/2017 at 1100  . atorvastatin (LIPITOR) 10 MG tablet Take 10 mg by mouth daily.   01/25/2017 at 1800   . famotidine (PEPCID) 10 MG tablet Take 10 mg by mouth as needed for heartburn or indigestion.   Past Week at Unknown time  . hydrochlorothiazide (HYDRODIURIL) 25 MG tablet Take 25 mg by mouth daily.   01/26/2017 at 1100  . ibuprofen (ADVIL,MOTRIN) 200 MG tablet Take 600-800 mg by mouth every 6 (six) hours as needed for mild pain.    01/25/2017 at Unknown time  . tamsulosin (FLOMAX) 0.4 MG CAPS capsule Take 0.4 mg by mouth daily.   01/26/2017 at 1100   Scheduled: . atorvastatin  10 mg Oral q1800  . diltiazem  60 mg Oral Q8H  . Influenza vac split quadrivalent PF  0.5 mL Intramuscular Tomorrow-1000  . ipratropium-albuterol  3 mL Nebulization TID  . nicotine  21 mg Transdermal Daily  . tamsulosin  0.4 mg Oral Daily   Continuous: . 0.9 % NaCl with KCl 40 mEq / L 100 mL/hr at 01/28/17 0800  . ciprofloxacin Stopped (01/28/17 0730)  . metronidazole Stopped (01/28/17 0250)   GGE:[id number], famotidine, ondansetron (ZOFRAN) IV  Assesment: He was admitted with atrial fib with RVR but has converted to sinus rhythm. He has right lower quadrant abdominal pain which looks like diverticulitis with perforation. He has COPD at baseline. He has a lung mass that is highly suggestive of bronchogenic cancer Principal Problem:   Atrial fibrillation with RVR ([place]) Active Problems:   Diverticulitis of intestine with perforation   Mass of upper lobe of right lung   Hypokalemia   Adrenal mass ([facility])   Tobacco abuse   COPD (chronic obstructive pulmonary disease) ([facility])   Hypertension   BPH (benign prostatic hypertrophy) with urinary retention   High cholesterol   Chronic kidney disease   Hyponatremia   Leukocytosis   RLQ abdominal pain    Plan: The lung mass would best be approached by needle biopsy I think. Will discuss with interventional radiology    LOS: 2 days   Aaron Sellers L 01/28/2017, 8:52 AM

## 2017-01-28 NOTE — Consult Note (Signed)
Reason for Consult: Perforated sigmoid diverticulitis Referring Physician: Dr. Dayton Scrape Peine is an 66 y.o. male.  HPI: Patient is a 66 year old white male who was seen in the emergency room for tachycardia, shortness of breath, and lower abdominal pain.  CT scan of the abdomen revealed contained microperforation of the sigmoid colon due to diverticulitis.  He was also noted to be in heart flutter and was admitted to the ICU for further evaluation and treatment.  He states that this is his first episode of diverticulitis.  He has never been admitted in the past for treatment.  He does not remember the last time he had a colonoscopy.  He states that since being in the hospital, his abdominal pain has significantly improved.  He is passing flatus and had a small bowel movement over the past 24 hours.  He denies any fever or chills.  He denies any nausea or vomiting.  Tolerating clear liquid diet well.  Past Medical History:  Diagnosis Date  . Arthritis   . BPH (benign prostatic hypertrophy) with urinary retention   . Chronic kidney disease    hx of kidney stones  2011  . COPD (chronic obstructive pulmonary disease) ([place])    has been smoking since he was 22 ish--  . High cholesterol   . Hypertension    dx around 2011    Past Surgical History:  Procedure Laterality Date  . HERNIA REPAIR     umbilical hernia  0/3474  . INCISION AND DRAINAGE ABSCESS     "down the middle" of his buttocks  2015  . TOTAL KNEE ARTHROPLASTY Left 10/29/2014   Procedure: TOTAL KNEE ARTHROPLASTY;  Surgeon: Dorna Leitz, MD;  Location: [facility];  Service: Orthopedics;  Laterality: Left;    Family History  Problem Relation Age of Onset  . Hypertension Mother   . CVA Father   . Lung cancer Sister   . Lung cancer Brother   . Hypertension Brother   . Hypertension Sister     Social History:  reports that he has been smoking Cigarettes.  He has a 40.00 pack-year smoking history. He has quit using smokeless  tobacco. His smokeless tobacco use included Snuff. He reports that he does not drink alcohol or use drugs.  Allergies:  Allergies  Allergen Reactions  . Adhesive [Tape] Other (See Comments)    Skin peels  . Codeine Other (See Comments)    Jitters, "skin crawling"    Medications:  Scheduled: . atorvastatin  10 mg Oral q1800  . diltiazem  60 mg Oral Q8H  . ipratropium-albuterol  3 mL Nebulization TID  . nicotine  21 mg Transdermal Daily  . tamsulosin  0.4 mg Oral Daily    Results for orders placed or performed during the hospital encounter of 01/26/17 (from the past 48 hour(s))  CBC     Status: Abnormal   Collection Time: 01/26/17  2:58 PM  Result Value Ref Range   WBC 19.2 (H) 4.0 - 10.5 K/uL   RBC 5.41 4.22 - 5.81 MIL/uL   Hemoglobin 15.9 13.0 - 17.0 g/dL   HCT 46.1 39.0 - 52.0 %   MCV 85.2 78.0 - 100.0 fL   MCH 29.4 26.0 - 34.0 pg   MCHC 34.5 30.0 - 36.0 g/dL   RDW 13.2 11.5 - 15.5 %   Platelets 210 150 - 400 K/uL  Comprehensive metabolic panel     Status: Abnormal   Collection Time: 01/26/17  3:12 PM  Result Value Ref  Range   Sodium 137 135 - 145 mmol/L   Potassium 3.3 (L) 3.5 - 5.1 mmol/L   Chloride 99 (L) 101 - 111 mmol/L   CO2 26 22 - 32 mmol/L   Glucose, Bld 120 (H) 65 - 99 mg/dL   BUN 21 (H) 6 - 20 mg/dL   Creatinine, Ser 0.87 0.61 - 1.24 mg/dL   Calcium 9.4 8.9 - 10.3 mg/dL   Total Protein 7.4 6.5 - 8.1 g/dL   Albumin 3.6 3.5 - 5.0 g/dL   AST 19 15 - 41 U/L   ALT 24 17 - 63 U/L   Alkaline Phosphatase 70 38 - 126 U/L   Total Bilirubin 1.7 (H) 0.3 - 1.2 mg/dL   GFR calc non Af Amer >60 >60 mL/min   GFR calc Af Amer >60 >60 mL/min    Comment: (NOTE) The eGFR has been calculated using the CKD EPI equation. This calculation has not been validated in all clinical situations. eGFR's persistently <60 mL/min signify possible Chronic Kidney Disease.    Anion gap 12 5 - 15  Lipase, blood     Status: None   Collection Time: 01/26/17  3:12 PM  Result Value Ref  Range   Lipase 18 11 - 51 U/L  Troponin I     Status: None   Collection Time: 01/26/17  3:12 PM  Result Value Ref Range   Troponin I <0.03 <0.03 ng/mL  Urinalysis, Routine w reflex microscopic     Status: Abnormal   Collection Time: 01/26/17  3:12 PM  Result Value Ref Range   Color, Urine YELLOW YELLOW   APPearance CLEAR CLEAR   Specific Gravity, Urine 1.026 1.005 - 1.030   pH 7.0 5.0 - 8.0   Glucose, UA NEGATIVE NEGATIVE mg/dL   Hgb urine dipstick SMALL (A) NEGATIVE   Bilirubin Urine NEGATIVE NEGATIVE   Ketones, ur 5 (A) NEGATIVE mg/dL   Protein, ur NEGATIVE NEGATIVE mg/dL   Nitrite NEGATIVE NEGATIVE   Leukocytes, UA NEGATIVE NEGATIVE   RBC / HPF 0-5 0 - 5 RBC/hpf   WBC, UA 0-5 0 - 5 WBC/hpf   Bacteria, UA NONE SEEN NONE SEEN   Squamous Epithelial / LPF NONE SEEN NONE SEEN  MRSA PCR Screening     Status: None   Collection Time: 01/26/17  9:25 PM  Result Value Ref Range   MRSA by PCR NEGATIVE NEGATIVE    Comment:        The GeneXpert MRSA Assay (FDA approved for NASAL specimens only), is one component of a comprehensive MRSA colonization surveillance program. It is not intended to diagnose MRSA infection nor to guide or monitor treatment for MRSA infections.   Basic metabolic panel     Status: Abnormal   Collection Time: 01/27/17  5:01 AM  Result Value Ref Range   Sodium 134 (L) 135 - 145 mmol/L   Potassium 3.3 (L) 3.5 - 5.1 mmol/L   Chloride 101 101 - 111 mmol/L   CO2 26 22 - 32 mmol/L   Glucose, Bld 114 (H) 65 - 99 mg/dL   BUN 18 6 - 20 mg/dL   Creatinine, Ser 0.77 0.61 - 1.24 mg/dL   Calcium 8.3 (L) 8.9 - 10.3 mg/dL   GFR calc non Af Amer >60 >60 mL/min   GFR calc Af Amer >60 >60 mL/min    Comment: (NOTE) The eGFR has been calculated using the CKD EPI equation. This calculation has not been validated in all clinical situations. eGFR's persistently <60  mL/min signify possible Chronic Kidney Disease.    Anion gap 7 5 - 15  CBC     Status: Abnormal    Collection Time: 01/27/17  5:01 AM  Result Value Ref Range   WBC 13.6 (H) 4.0 - 10.5 K/uL   RBC 5.04 4.22 - 5.81 MIL/uL   Hemoglobin 14.5 13.0 - 17.0 g/dL   HCT 43.4 39.0 - 52.0 %   MCV 86.1 78.0 - 100.0 fL   MCH 28.8 26.0 - 34.0 pg   MCHC 33.4 30.0 - 36.0 g/dL   RDW 13.3 11.5 - 15.5 %   Platelets 210 150 - 400 K/uL  Magnesium     Status: None   Collection Time: 01/27/17  5:01 AM  Result Value Ref Range   Magnesium 1.7 1.7 - 2.4 mg/dL  TSH     Status: None   Collection Time: 01/27/17  5:02 AM  Result Value Ref Range   TSH 2.088 0.350 - 4.500 uIU/mL    Comment: Performed by a 3rd Generation assay with a functional sensitivity of <=0.01 uIU/mL.  T4, free     Status: None   Collection Time: 01/27/17  5:02 AM  Result Value Ref Range   Free T4 1.12 0.61 - 1.12 ng/dL    Comment: (NOTE) Biotin ingestion may interfere with free T4 tests. If the results are inconsistent with the TSH level, previous test results, or the clinical presentation, then consider biotin interference. If needed, order repeat testing after stopping biotin. Performed at [facility], [place] [address] [place], [place] [address]   Lipid panel     Status: Abnormal   Collection Time: 01/27/17  5:02 AM  Result Value Ref Range   Cholesterol 133 0 - 200 mg/dL   Triglycerides 77 <150 mg/dL   HDL 33 (L) >40 mg/dL   Total CHOL/HDL Ratio 4.0 RATIO   VLDL 15 0 - 40 mg/dL   LDL Cholesterol 85 0 - 99 mg/dL    Comment:        Total Cholesterol/HDL:CHD Risk Coronary Heart Disease Risk Table                     Men   Women  1/2 Average Risk   3.4   3.3  Average Risk       5.0   4.4  2 X Average Risk   9.6   7.1  3 X Average Risk  23.4   11.0        Use the calculated Patient Ratio above and the CHD Risk Table to determine the patient's CHD Risk.        ATP III CLASSIFICATION (LDL):  <100     mg/dL   Optimal  100-129  mg/dL   Near or Above                    Optimal  130-159  mg/dL   Borderline  160-189   mg/dL   High  >190     mg/dL   Very High   CBC with Differential/Platelet     Status: Abnormal   Collection Time: 01/28/17  4:11 AM  Result Value Ref Range   WBC 12.3 (H) 4.0 - 10.5 K/uL   RBC 4.86 4.22 - 5.81 MIL/uL   Hemoglobin 13.8 13.0 - 17.0 g/dL   HCT 42.2 39.0 - 52.0 %   MCV 86.8 78.0 - 100.0 fL   MCH 28.4 26.0 - 34.0 pg  MCHC 32.7 30.0 - 36.0 g/dL   RDW 13.4 11.5 - 15.5 %   Platelets 217 150 - 400 K/uL   Neutrophils Relative % 73 %   Neutro Abs 9.1 (H) 1.7 - 7.7 K/uL   Lymphocytes Relative 16 %   Lymphs Abs 1.9 0.7 - 4.0 K/uL   Monocytes Relative 9 %   Monocytes Absolute 1.1 (H) 0.1 - 1.0 K/uL   Eosinophils Relative 2 %   Eosinophils Absolute 0.2 0.0 - 0.7 K/uL   Basophils Relative 0 %   Basophils Absolute 0.0 0.0 - 0.1 K/uL  Comprehensive metabolic panel     Status: Abnormal   Collection Time: 01/28/17  4:11 AM  Result Value Ref Range   Sodium 135 135 - 145 mmol/L   Potassium 3.8 3.5 - 5.1 mmol/L   Chloride 99 (L) 101 - 111 mmol/L   CO2 28 22 - 32 mmol/L   Glucose, Bld 105 (H) 65 - 99 mg/dL   BUN 10 6 - 20 mg/dL   Creatinine, Ser 0.80 0.61 - 1.24 mg/dL   Calcium 8.3 (L) 8.9 - 10.3 mg/dL   Total Protein 6.1 (L) 6.5 - 8.1 g/dL   Albumin 3.0 (L) 3.5 - 5.0 g/dL   AST 15 15 - 41 U/L   ALT 21 17 - 63 U/L   Alkaline Phosphatase 55 38 - 126 U/L   Total Bilirubin 1.0 0.3 - 1.2 mg/dL   GFR calc non Af Amer >60 >60 mL/min   GFR calc Af Amer >60 >60 mL/min    Comment: (NOTE) The eGFR has been calculated using the CKD EPI equation. This calculation has not been validated in all clinical situations. eGFR's persistently <60 mL/min signify possible Chronic Kidney Disease.    Anion gap 8 5 - 15  Magnesium     Status: None   Collection Time: 01/28/17  4:11 AM  Result Value Ref Range   Magnesium 2.1 1.7 - 2.4 mg/dL    Dg Chest 2 View  Result Date: 01/26/2017 CLINICAL DATA:  Irregular heartbeat.  Fatigue.  Anorexia. EXAM: CHEST  2 VIEW COMPARISON:  10/21/2014  FINDINGS: The heart size and mediastinal contours are within normal limits. No evidence of pulmonary consolidation or pleural effusion. Mild right lateral pleural thickening is stable. A new irregular masslike opacity is seen in the right upper lobe measuring approximately 3 cm. This is suspicious for neoplasm. IMPRESSION: New 3 mm irregular masslike opacity in right upper lobe, suspicious for neoplasm. Chest CT with contrast is recommended for further evaluation. Electronically Signed   By: Earle Gell M.D.   On: 01/26/2017 17:34   Ct Chest W Contrast  Result Date: 01/28/2017 CLINICAL DATA:  Right upper lobe pulmonary nodule on chest radiograph. EXAM: CT CHEST WITH CONTRAST TECHNIQUE: Multidetector CT imaging of the chest was performed during intravenous contrast administration. CONTRAST:  79m ISOVUE-300 IOPAMIDOL (ISOVUE-300) INJECTION 61% COMPARISON:  Chest radiograph of 01/26/2017. FINDINGS: Cardiovascular: Aortic and branch vessel atherosclerosis. Normal heart size, without pericardial effusion. Lad and right coronary artery atherosclerosis. No central pulmonary embolism, on this non-dedicated study. Mediastinum/Nodes: No supraclavicular adenopathy. Right paratracheal adenopathy 1.5 cm on image 66/series 2. Adenopathy within the azygoesophageal recess measures 1.4 cm on image 81/series 2. Right hilar adenopathy at 3.0 cm on image 75/series 2. Lungs/Pleura: No pleural fluid. Corresponding to the plain film abnormality, within the right upper lobe laterally, is a spiculated mass which measures 3.3 by 2.7 cm on image 51/ series 4. This contacts the pleural surface  laterally. 3 mm vague left upper lobe pulmonary nodule on image 71/series 4. Minimal dependent posterior left upper lobe atelectasis. Upper Abdomen: Moderate hepatic steatosis. Normal imaged portions of the spleen, stomach, pancreas, gallbladder, kidneys. Mild left adrenal thickening. Right adrenal nodularity, with the most well-defined component  measuring maximally 2.6 cm on image 158/ series 2. Abdominal aortic and branch vessel atherosclerosis. Musculoskeletal: Moderate upper thoracic spondylosis. IMPRESSION: 1. Right upper lobe lung mass, consistent with primary bronchogenic carcinoma. 2. Mediastinal and right hilar adenopathy, consistent with nodal metastasis. 3. Right adrenal nodularity which is indeterminate. Cannot exclude metastatic disease. 4. Consider nonemergent/outpatient PET for further staging. 5. Coronary artery atherosclerosis. Aortic Atherosclerosis (ICD10-I70.0). 6. Hepatic steatosis. Electronically Signed   By: Abigail Miyamoto M.D.   On: 01/28/2017 07:51   Ct Abdomen Pelvis W Contrast  Result Date: 01/26/2017 CLINICAL DATA:  Lower abdominal pain EXAM: CT ABDOMEN AND PELVIS WITH CONTRAST TECHNIQUE: Multidetector CT imaging of the abdomen and pelvis was performed using the standard protocol following bolus administration of intravenous contrast. CONTRAST:  191m ISOVUE-300 IOPAMIDOL (ISOVUE-300) INJECTION 61% COMPARISON:  None. FINDINGS: Lower chest: Bases demonstrate no acute consolidation or pleural effusion. Borderline heart size. Mild coronary calcification. Hepatobiliary: No focal liver abnormality is seen. No gallstones, gallbladder wall thickening, or biliary dilatation. Pancreas: Unremarkable. No pancreatic ductal dilatation or surrounding inflammatory changes. Spleen: Normal in size without focal abnormality. Adrenals/Urinary Tract: Left adrenal gland appears normal. 2.8 cm enhancing mass in the right adrenal gland. Nonspecific perinephric fat stranding. 6 mm stone in the mid right kidney. 2.1 cm cyst in the right kidney. Punctate nonobstructing stone in the mid left kidney. Bladder within normal limits. Stomach/Bowel: The stomach is collapsed. No dilated small bowel. Normal appendix. Diverticular disease of the colon, most heavily concentrated at the sigmoid colon which is redundant. Focal wall thickening and moderate  surrounding inflammation at the distal sigmoid colon consistent with diverticulitis. Irregular gas collection adjacent to the sigmoid colon is suspect for contained perforation. No abscess. Vascular/Lymphatic: Extensive atherosclerotic calcifications of the aorta duct. 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 No significantly enlarged lymph nodes. Reproductive: Prostate is unremarkable. Other: Negative for free air or significant free fluid. Musculoskeletal: Degenerative changes of the spine. No acute osseous abnormality. IMPRESSION: 1. Focal wall thickening and moderate surrounding inflammation involving the distal sigmoid colon with diverticula present, findings would be most consistent with an acute diverticulitis. Suspected small extraluminal gas collection adjacent to the inflamed sigmoid colon concerning for focal contained perforation. No free air or abscess. 2. 2.8 cm enhancing mass in the right adrenal gland, possible adenoma, however recommend non emergent adrenal CT protocol to evaluate 3. Nonobstructing stones within both kidneys. 4. Small aortic aneurysm measuring 3 cm. Recommend followup by ultrasound in 3 years. This recommendation follows ACR consensus guidelines: White Paper of the ACR Incidental Findings Committee II on Vascular Findings. JNatasha MeadColl Radiol 2013; 10:789-794 Electronically Signed   By: KDonavan FoilM.D.   On: 01/26/2017 17:30    ROS:  Pertinent items are noted in HPI.  Blood pressure 125/86, pulse 65, temperature 98.5 F (36.9 C), temperature source Oral, resp. rate (!) 27, height _0  (2.007 m), weight 288 lb 5.8 oz (130.8 kg), SpO2 97 %. Physical Exam: Pleasant well-developed well-nourished white male no acute distress Head is normocephalic, atraumatic Skin is warm and dry Patient is alert and oriented x3 Heart examination reveals a regular rate and rhythm without S3, S4, murmurs Lung examination reveals no wheezing  with equal breath sounds  bilaterally, but some upper respiratory rhonchi noted. Abdomen is soft, nontender, nondistended.  Due to body habitus, I cannot appreciate hepatosplenomegaly.  No rigidity is noted.  CT scan images personally reviewed  Assessment/Plan: Impression: Sigmoid diverticulitis with contained microperforation, resolving.  No need for acute surgical intervention at this time.  Incidentally, patient has been found to have a right lung mass on CT scan of the chest. Plan: Continue IV antibiotics.  Patient's diet may be advanced as tolerated.  Will follow with you.  Aviva Signs 01/28/2017, 9:53 AM

## 2017-01-28 NOTE — Progress Notes (Signed)
IR aware of request for lung biopsy.  Case discussed with Dr. Earleen Newport.  Would recommend with outpatient workup including a PET scan prior to proceeding with a lung biopsy.  The patient has a couple of other areas on his CT that may light up on PET and be safer than a lung biopsy.  This was D/W Dr. Luan Pulling who agrees with this plan.  Rosalena Mccorry E 12:01 PM 01/28/2017

## 2017-01-28 NOTE — Plan of Care (Signed)
Problem: Safety: Goal: Ability to remain free from injury will improve Outcome: Progressing Bed in low position, side rails up, call bell and personal items within reach.

## 2017-01-29 ENCOUNTER — Inpatient Hospital Stay (HOSPITAL_COMMUNITY): Payer: Managed Care, Other (non HMO)

## 2017-01-29 DIAGNOSIS — R918 Other nonspecific abnormal finding of lung field: Secondary | ICD-10-CM

## 2017-01-29 DIAGNOSIS — N401 Enlarged prostate with lower urinary tract symptoms: Secondary | ICD-10-CM

## 2017-01-29 DIAGNOSIS — I4892 Unspecified atrial flutter: Principal | ICD-10-CM

## 2017-01-29 DIAGNOSIS — R338 Other retention of urine: Secondary | ICD-10-CM

## 2017-01-29 LAB — CBC WITH DIFFERENTIAL/PLATELET
BASOS ABS: 0 10*3/uL (ref 0.0–0.1)
BASOS PCT: 0 %
EOS ABS: 0.2 10*3/uL (ref 0.0–0.7)
Eosinophils Relative: 2 %
HCT: 45.3 % (ref 39.0–52.0)
HEMOGLOBIN: 15 g/dL (ref 13.0–17.0)
LYMPHS ABS: 2.2 10*3/uL (ref 0.7–4.0)
Lymphocytes Relative: 19 %
MCH: 29 pg (ref 26.0–34.0)
MCHC: 33.1 g/dL (ref 30.0–36.0)
MCV: 87.6 fL (ref 78.0–100.0)
Monocytes Absolute: 1.1 10*3/uL — ABNORMAL HIGH (ref 0.1–1.0)
Monocytes Relative: 9 %
NEUTROS PCT: 70 %
Neutro Abs: 8.1 10*3/uL — ABNORMAL HIGH (ref 1.7–7.7)
PLATELETS: 241 10*3/uL (ref 150–400)
RBC: 5.17 MIL/uL (ref 4.22–5.81)
RDW: 13.4 % (ref 11.5–15.5)
WBC: 11.6 10*3/uL — AB (ref 4.0–10.5)

## 2017-01-29 LAB — LIPID PANEL
CHOL/HDL RATIO: 3.9 ratio
Cholesterol: 117 mg/dL (ref 0–200)
HDL: 30 mg/dL — ABNORMAL LOW (ref >40)
LDL CALC: 76 mg/dL (ref 0–99)
TRIGLYCERIDES: 55 mg/dL (ref <150)
VLDL: 11 mg/dL (ref 0–40)

## 2017-01-29 LAB — COMPREHENSIVE METABOLIC PANEL
ALBUMIN: 3.3 g/dL — AB (ref 3.5–5.0)
ALK PHOS: 72 U/L (ref 38–126)
ALT: 25 U/L (ref 17–63)
AST: 20 U/L (ref 15–41)
Anion gap: 6 (ref 5–15)
BUN: 7 mg/dL (ref 6–20)
CALCIUM: 8.8 mg/dL — AB (ref 8.9–10.3)
CHLORIDE: 104 mmol/L (ref 101–111)
CO2: 24 mmol/L (ref 22–32)
CREATININE: 0.76 mg/dL (ref 0.61–1.24)
GFR calc Af Amer: >60 mL/min (ref >60)
GFR calc non Af Amer: >60 mL/min (ref >60)
GLUCOSE: 102 mg/dL — AB (ref 65–99)
Potassium: 4.3 mmol/L (ref 3.5–5.1)
SODIUM: 134 mmol/L — AB (ref 135–145)
Total Bilirubin: 1 mg/dL (ref 0.3–1.2)
Total Protein: 6.8 g/dL (ref 6.5–8.1)

## 2017-01-29 LAB — MAGNESIUM: Magnesium: 2 mg/dL (ref 1.7–2.4)

## 2017-01-29 MED ORDER — METRONIDAZOLE 500 MG PO TABS
500.0000 mg | ORAL_TABLET | Freq: Three times a day (TID) | ORAL | Status: DC
  Administered 2017-01-30 (×2): 500 mg via ORAL
  Filled 2017-01-29 (×2): qty 1

## 2017-01-29 MED ORDER — CIPROFLOXACIN HCL 250 MG PO TABS
500.0000 mg | ORAL_TABLET | Freq: Two times a day (BID) | ORAL | Status: DC
  Administered 2017-01-30: 500 mg via ORAL
  Filled 2017-01-29: qty 2

## 2017-01-29 MED ORDER — LEVALBUTEROL HCL 0.63 MG/3ML IN NEBU
0.6300 mg | INHALATION_SOLUTION | Freq: Two times a day (BID) | RESPIRATORY_TRACT | Status: DC
  Administered 2017-01-30: 0.63 mg via RESPIRATORY_TRACT
  Filled 2017-01-29: qty 3

## 2017-01-29 MED ORDER — LEVALBUTEROL HCL 0.63 MG/3ML IN NEBU
0.6300 mg | INHALATION_SOLUTION | Freq: Two times a day (BID) | RESPIRATORY_TRACT | Status: DC
  Administered 2017-01-29: 0.63 mg via RESPIRATORY_TRACT
  Filled 2017-01-29: qty 3

## 2017-01-29 MED ORDER — DIPHENOXYLATE-ATROPINE 2.5-0.025 MG PO TABS: 1.0000 | ORAL_TABLET | Freq: Four times a day (QID) | ORAL | Status: DC | PRN

## 2017-01-29 MED ORDER — DILTIAZEM HCL ER COATED BEADS 180 MG PO CP24
180.0000 mg | ORAL_CAPSULE | Freq: Every day | ORAL | Status: DC
  Filled 2017-01-29: qty 1

## 2017-01-29 NOTE — Progress Notes (Signed)
PROGRESS NOTE    Aaron Sellers  KKX:[id number]  DOB: 07-31-1950  DOA: 01/26/2017 PCP: Sheryle Hail, PA-C   Brief Admission Hx: Aaron Sellers is a 66 y.o. male with a history of COPD, tobacco abuse, high cholesterol, hypertension not currently on medications.  Patient seen for 3 days of worsening abdominal pain in the right lower quadrant.  Pain is nonradiating, but accompanied by diminished appetite.  He denies nausea, vomiting, diarrhea, constipation, hematochezia, melena.  He has been feeling more tired over the past day or 2.  Patient went to urgent care and had an EKG done due to tachycardia.  Due to the arrhythmia, the patient was sent to the hospital for evaluation.  MDM/Assessment & Plan:   1. Atrial Flutter/AFib with RVR - Pt off the IV diltiazem, rate controlled with oral diltiazem.  Normal TSH noted.  Echocardiogram reveals EF 55-60% with no regional wall abnormalities.   I requested cardiology to see him for new onset atrial fibrillation. He is not anticoagulated at this time.   2. Acute diverticulitis with perforation - Clinically improving abdominal exam.  Continue IV cipro/flagyl and plan to start oral tomorrow. He will need 2 weeks of treatment per gen surgery.  General surgery consultation requested.  Advancing diet today.  Lomotil ordered for loose stools.  3. RUL Lung Mass - 3 mm lesion seen, CT chest confirmed.  I spoke to the patient about this at bedside.  Pulmonary consulted.  He will need outpatient work up for this.  He will need a PET scan and subsequent lung biopsy.  This should be done outpatient.   4. COPD - with wheezing, sounds better today, continue scheduled duonebs. 5. Tobacco abuse - counseled on smoking cessation, continue nicotine patch.    6. Leukocytosis - WBC trending down with IV antibiotics.   7. Hyponatremia - improved with IV hydration with Normal Saline.   8. Hypokalemia - supplemented magnesium, and potassium was repleted.  9. BPH - resumed home  flomax daily and monitor urine output, catheterize PRN if unable to void.   10. Generalized weakness - get PT / OT eval.   DVT prophylaxis: SCD and ambulation  Code Status: full  Family Communication: none present Disposition Plan: Home in 1-2 days  Consultants:  General surgery  cardiology  Subjective: Pt says he had an episode of palpitations last night, also having loose stools  Objective: Vitals:   01/28/17 2129 01/29/17 0008 01/29/17 0508 01/29/17 0839  BP: 115/74 (!) 118/91 120/81   Pulse: 72 (!) 147 (!) 101   Resp: 20  20   Temp: 98.2 F (36.8 C) (!) 97.4 F (36.3 C) 98.2 F (36.8 C)   TempSrc: Oral Oral Oral   SpO2: 97% 94% 95% 96%  Weight:   130.8 kg (288 lb 6.1 oz)   Height:        Intake/Output Summary (Last 24 hours) at 01/29/17 1025 Last data filed at 01/29/17 0509  Gross per 24 hour  Intake             1380 ml  Output             1475 ml  Net              -95 ml   Filed Weights   01/27/17 0447 01/28/17 0500 01/29/17 0508  Weight: 128.3 kg (282 lb 13.6 oz) 130.8 kg (288 lb 5.8 oz) 130.8 kg (288 lb 6.1 oz)   REVIEW OF SYSTEMS  As per history  otherwise all reviewed and reported negative  Exam:  General exam: awake, alert, NAD. Cooperative.  Respiratory system: diffuse exp wheezing LUL. No increased work of breathing. Cardiovascular system: S1 & S2 heard, RRR.  Gastrointestinal system: Abdomen is nondistended, soft and mild tenderness RLQ. Normal bowel sounds heard. Central nervous system: Alert and oriented. No focal neurological deficits. Extremities: no CCE.  Data Reviewed: Basic Metabolic Panel:  Recent Labs Lab 01/26/17 1512 01/27/17 0501 01/28/17 0411 01/29/17 0428  NA 137 134* 135 134*  K 3.3* 3.3* 3.8 4.3  CL 99* 101 99* 104  CO2 26 26 28 24   GLUCOSE 120* 114* 105* 102*  BUN 21* 18 10 7   CREATININE 0.87 0.77 0.80 0.76  CALCIUM 9.4 8.3* 8.3* 8.8*  MG  --  1.7 2.1 2.0   Liver Function Tests:  Recent Labs Lab 01/26/17 1512  01/28/17 0411 01/29/17 0428  AST 19 15 20   ALT 24 21 25   ALKPHOS 70 55 72  BILITOT 1.7* 1.0 1.0  PROT 7.4 6.1* 6.8  ALBUMIN 3.6 3.0* 3.3*    Recent Labs Lab 01/26/17 1512  LIPASE 18   No results for input(s): AMMONIA in the last 168 hours. CBC:  Recent Labs Lab 01/26/17 1458 01/27/17 0501 01/28/17 0411 01/29/17 0428  WBC 19.2* 13.6* 12.3* 11.6*  NEUTROABS  --   --  9.1* 8.1*  HGB 15.9 14.5 13.8 15.0  HCT 46.1 43.4 42.2 45.3  MCV 85.2 86.1 86.8 87.6  PLT 210 210 217 241   Cardiac Enzymes:  Recent Labs Lab 01/26/17 1512  TROPONINI <0.03   CBG (last 3)  No results for input(s): GLUCAP in the last 72 hours. Recent Results (from the past 240 hour(s))  MRSA PCR Screening     Status: None   Collection Time: 01/26/17  9:25 PM  Result Value Ref Range Status   MRSA by PCR NEGATIVE NEGATIVE Final    Comment:        The GeneXpert MRSA Assay (FDA approved for NASAL specimens only), is one component of a comprehensive MRSA colonization surveillance program. It is not intended to diagnose MRSA infection nor to guide or monitor treatment for MRSA infections.      Studies: Ct Chest W Contrast  Result Date: 01/28/2017 CLINICAL DATA:  Right upper lobe pulmonary nodule on chest radiograph. EXAM: CT CHEST WITH CONTRAST TECHNIQUE: Multidetector CT imaging of the chest was performed during intravenous contrast administration. CONTRAST:  31mL ISOVUE-300 IOPAMIDOL (ISOVUE-300) INJECTION 61% COMPARISON:  Chest radiograph of 01/26/2017. FINDINGS: Cardiovascular: Aortic and branch vessel atherosclerosis. Normal heart size, without pericardial effusion. Lad and right coronary artery atherosclerosis. No central pulmonary embolism, on this non-dedicated study. Mediastinum/Nodes: No supraclavicular adenopathy. Right paratracheal adenopathy 1.5 cm on image 66/series 2. Adenopathy within the azygoesophageal recess measures 1.4 cm on image 81/series 2. Right hilar adenopathy at 3.0 cm on  image 75/series 2. Lungs/Pleura: No pleural fluid. Corresponding to the plain film abnormality, within the right upper lobe laterally, is a spiculated mass which measures 3.3 by 2.7 cm on image 51/ series 4. This contacts the pleural surface laterally. 3 mm vague left upper lobe pulmonary nodule on image 71/series 4. Minimal dependent posterior left upper lobe atelectasis. Upper Abdomen: Moderate hepatic steatosis. Normal imaged portions of the spleen, stomach, pancreas, gallbladder, kidneys. Mild left adrenal thickening. Right adrenal nodularity, with the most well-defined component measuring maximally 2.6 cm on image 158/ series 2. Abdominal aortic and branch vessel atherosclerosis. Musculoskeletal: Moderate upper thoracic spondylosis. IMPRESSION: 1. Right  upper lobe lung mass, consistent with primary bronchogenic carcinoma. 2. Mediastinal and right hilar adenopathy, consistent with nodal metastasis. 3. Right adrenal nodularity which is indeterminate. Cannot exclude metastatic disease. 4. Consider nonemergent/outpatient PET for further staging. 5. Coronary artery atherosclerosis. Aortic Atherosclerosis (ICD10-I70.0). 6. Hepatic steatosis. Electronically Signed   By: Abigail Miyamoto M.D.   On: 01/28/2017 07:51   Scheduled Meds: . atorvastatin  10 mg Oral q1800  . [START ON 01/30/2017] ciprofloxacin  500 mg Oral BID  . [START ON 01/30/2017] diltiazem  180 mg Oral Daily  . diltiazem  60 mg Oral Q8H  . ipratropium-albuterol  3 mL Nebulization TID  . [START ON 01/30/2017] metroNIDAZOLE  500 mg Oral Q8H  . nicotine  21 mg Transdermal Daily  . tamsulosin  0.4 mg Oral Daily   Continuous Infusions: . 0.9 % NaCl with KCl 40 mEq / L 100 mL/hr (01/29/17 1001)  . ciprofloxacin Stopped (01/29/17 4235)  . metronidazole 500 mg (01/29/17 1001)    Principal Problem:   Atrial fibrillation with RVR (HCC) Active Problems:   Diverticulitis of intestine with perforation   Mass of upper lobe of right lung   Adrenal  mass (HCC)   Tobacco abuse   COPD (chronic obstructive pulmonary disease) (HCC)   Leukocytosis   RLQ abdominal pain   Hypokalemia   Chronic kidney disease   Hyponatremia   Hypertension   High cholesterol   BPH (benign prostatic hypertrophy) with urinary retention   Irwin Brakeman, MD [facility] Pager [phone]  If 7PM-7AM, please contact night-coverage [email or web address] Password [email or web address] 01/29/2017, 10:25 AM    LOS: 3 days

## 2017-01-29 NOTE — Evaluation (Signed)
Physical Therapy Evaluation Patient Details Name: Aaron Sellers MRN: [id number] DOB: November 05, 1950 Today's Date: 01/29/2017   History of Present Illness  Aaron Sellers is a 65 y.o. male with a history of COPD, tobacco abuse, high cholesterol, hypertension not currently on medications.  Patient seen for 3 days of worsening abdominal pain in the right lower quadrant.  Pain is nonradiating, but accompanied by diminished appetite.  He denies nausea, vomiting, diarrhea, constipation, hematochezia, melena.  He has been feeling more tired over the past day or 2.  Patient went to urgent care and had an EKG done due to tachycardia.  Due to the arrhythmia, the patient was sent to the hospital for evaluation.    Clinical Impression  Patient functioning at baseline for mobility and gait and discharged from physical therapy to care of nursing staff for ambulation daily as tolerated for length of stay.    Follow Up Recommendations No PT follow up    Equipment Recommendations  None recommended by PT    Recommendations for Other Services       Precautions / Restrictions Precautions Precautions: None Restrictions Weight Bearing Restrictions: No      Mobility  Bed Mobility Overal bed mobility: Independent                Transfers Overall transfer level: Independent                  Ambulation/Gait Ambulation/Gait assistance: Independent   Assistive device: None Gait Pattern/deviations: WFL(Within Functional Limits)   Gait velocity interpretation: at or above normal speed for age/gender    Stairs            Wheelchair Mobility    Modified Rankin (Stroke Patients Only)       Balance Overall balance assessment: No apparent balance deficits (not formally assessed)                                           Pertinent Vitals/Pain Pain Assessment: No/denies pain    Home Living Family/patient expects to be discharged to:: Private residence Living  Arrangements: Spouse/significant other Available Help at Discharge: Family Type of Home: House Home Access: Level entry     Home Layout: Multi-level Home Equipment: Environmental consultant - 2 wheels;Cane - single point;Shower seat;Bedside commode      Prior Function Level of Independence: Independent               Hand Dominance        Extremity/Trunk Assessment   Upper Extremity Assessment Upper Extremity Assessment: Overall WFL for tasks assessed    Lower Extremity Assessment Lower Extremity Assessment: Overall WFL for tasks assessed    Cervical / Trunk Assessment Cervical / Trunk Assessment: Normal  Communication   Communication: No difficulties  Cognition Arousal/Alertness: Awake/alert Behavior During Therapy: WFL for tasks assessed/performed Overall Cognitive Status: Within Functional Limits for tasks assessed                                        General Comments      Exercises     Assessment/Plan    PT Assessment Patent does not need any further PT services  PT Problem List         PT Treatment Interventions      PT Goals (Current goals  can be found in the Care Plan section)  Acute Rehab PT Goals Patient Stated Goal: return home PT Goal Formulation: With patient Time For Goal Achievement: 01/29/17 Potential to Achieve Goals: Good    Frequency     Barriers to discharge        Co-evaluation               AM-PAC PT "6 Clicks" Daily Activity  Outcome Measure Difficulty turning over in bed (including adjusting bedclothes, sheets and blankets)?: None Difficulty moving from lying on back to sitting on the side of the bed? : None Difficulty sitting down on and standing up from a chair with arms (e.g., wheelchair, bedside commode, etc,.)?: None Help needed moving to and from a bed to chair (including a wheelchair)?: None Help needed walking in hospital room?: None Help needed climbing 3-5 steps with a railing? : None 6 Click Score:  24    End of Session   Activity Tolerance: Patient tolerated treatment well Patient left: in bed (seated at bedside) Nurse Communication: Mobility status PT Visit Diagnosis: Unsteadiness on feet (R26.81);Other abnormalities of gait and mobility (R26.89);Muscle weakness (generalized) (M62.81)    Time: 2800-3491 PT Time Calculation (min) (ACUTE ONLY): 23 min   Charges:   PT Evaluation $PT Eval Low Complexity: 1 Low PT Treatments $Therapeutic Activity: 8-22 mins   PT G Codes:   PT G-Codes **NOT FOR INPATIENT CLASS** Functional Assessment Tool Used: AM-PAC 6 Clicks Basic Mobility Functional Limitation: Mobility: Walking and moving around Mobility: Walking and Moving Around Current Status (P9150): 0 percent impaired, limited or restricted Mobility: Walking and Moving Around Goal Status (V6979): 0 percent impaired, limited or restricted Mobility: Walking and Moving Around Discharge Status [id number]): 0 percent impaired, limited or restricted    2:05 PM, 01/29/17 Lonell Grandchild, MPT Physical Therapist with [facility] [phone] office [phone] mobile phone

## 2017-01-29 NOTE — Progress Notes (Signed)
Subjective: He says he feels okay.  No new complaints.  I discussed his situation with interventional radiology and their suggestion is that he have  a PET scan as an outpatient prior to biopsy.  Objective: Vital signs in last 24 hours: Temp:  [97.4 F (36.3 C)-98.2 F (36.8 C)] 98.2 F (36.8 C) (10/30 0508) Pulse Rate:  [62-147] 101 (10/30 0508) Resp:  [13-24] 20 (10/30 0508) BP: (113-122)/(74-110) 120/81 (10/30 0508) SpO2:  [94 %-100 %] 95 % (10/30 0508) Weight:  [130.8 kg (288 lb 6.1 oz)] 130.8 kg (288 lb 6.1 oz) (10/30 0508) Weight change: 0.01 kg (0.4 oz) Last BM Date: 01/28/17  Intake/Output from previous day: 10/29 0701 - 10/30 0700 In: 1530 [P.O.:480; I.V.:950; IV Piggyback:100] Out: 2050 [Urine:2050]  PHYSICAL EXAM General appearance: alert, cooperative and no distress Resp: rhonchi bilaterally Cardio: regular rate and rhythm, S1, S2 normal, no murmur, click, rub or gallop GI: soft, non-tender; bowel sounds normal; no masses,  no organomegaly Extremities: extremities normal, atraumatic, no cyanosis or edema Skin warm and dry  Lab Results:  Results for orders placed or performed during the hospital encounter of 01/26/17 (from the past 48 hour(s))  CBC with Differential/Platelet     Status: Abnormal   Collection Time: 01/28/17  4:11 AM  Result Value Ref Range   WBC 12.3 (H) 4.0 - 10.5 K/uL   RBC 4.86 4.22 - 5.81 MIL/uL   Hemoglobin 13.8 13.0 - 17.0 g/dL   HCT 42.2 39.0 - 52.0 %   MCV 86.8 78.0 - 100.0 fL   MCH 28.4 26.0 - 34.0 pg   MCHC 32.7 30.0 - 36.0 g/dL   RDW 13.4 11.5 - 15.5 %   Platelets 217 150 - 400 K/uL   Neutrophils Relative % 73 %   Neutro Abs 9.1 (H) 1.7 - 7.7 K/uL   Lymphocytes Relative 16 %   Lymphs Abs 1.9 0.7 - 4.0 K/uL   Monocytes Relative 9 %   Monocytes Absolute 1.1 (H) 0.1 - 1.0 K/uL   Eosinophils Relative 2 %   Eosinophils Absolute 0.2 0.0 - 0.7 K/uL   Basophils Relative 0 %   Basophils Absolute 0.0 0.0 - 0.1 K/uL  Comprehensive  metabolic panel     Status: Abnormal   Collection Time: 01/28/17  4:11 AM  Result Value Ref Range   Sodium 135 135 - 145 mmol/L   Potassium 3.8 3.5 - 5.1 mmol/L   Chloride 99 (L) 101 - 111 mmol/L   CO2 28 22 - 32 mmol/L   Glucose, Bld 105 (H) 65 - 99 mg/dL   BUN 10 6 - 20 mg/dL   Creatinine, Ser 0.80 0.61 - 1.24 mg/dL   Calcium 8.3 (L) 8.9 - 10.3 mg/dL   Total Protein 6.1 (L) 6.5 - 8.1 g/dL   Albumin 3.0 (L) 3.5 - 5.0 g/dL   AST 15 15 - 41 U/L   ALT 21 17 - 63 U/L   Alkaline Phosphatase 55 38 - 126 U/L   Total Bilirubin 1.0 0.3 - 1.2 mg/dL   GFR calc non Af Amer >60 >60 mL/min   GFR calc Af Amer >60 >60 mL/min    Comment: (NOTE) The eGFR has been calculated using the CKD EPI equation. This calculation has not been validated in all clinical situations. eGFR's persistently <60 mL/min signify possible Chronic Kidney Disease.    Anion gap 8 5 - 15  Magnesium     Status: None   Collection Time: 01/28/17  4:11 AM  Result Value Ref Range   Magnesium 2.1 1.7 - 2.4 mg/dL  CBC with Differential/Platelet     Status: Abnormal   Collection Time: 01/29/17  4:28 AM  Result Value Ref Range   WBC 11.6 (H) 4.0 - 10.5 K/uL   RBC 5.17 4.22 - 5.81 MIL/uL   Hemoglobin 15.0 13.0 - 17.0 g/dL   HCT 45.3 39.0 - 52.0 %   MCV 87.6 78.0 - 100.0 fL   MCH 29.0 26.0 - 34.0 pg   MCHC 33.1 30.0 - 36.0 g/dL   RDW 13.4 11.5 - 15.5 %   Platelets 241 150 - 400 K/uL   Neutrophils Relative % 70 %   Neutro Abs 8.1 (H) 1.7 - 7.7 K/uL   Lymphocytes Relative 19 %   Lymphs Abs 2.2 0.7 - 4.0 K/uL   Monocytes Relative 9 %   Monocytes Absolute 1.1 (H) 0.1 - 1.0 K/uL   Eosinophils Relative 2 %   Eosinophils Absolute 0.2 0.0 - 0.7 K/uL   Basophils Relative 0 %   Basophils Absolute 0.0 0.0 - 0.1 K/uL  Comprehensive metabolic panel     Status: Abnormal   Collection Time: 01/29/17  4:28 AM  Result Value Ref Range   Sodium 134 (L) 135 - 145 mmol/L   Potassium 4.3 3.5 - 5.1 mmol/L   Chloride 104 101 - 111 mmol/L    CO2 24 22 - 32 mmol/L   Glucose, Bld 102 (H) 65 - 99 mg/dL   BUN 7 6 - 20 mg/dL   Creatinine, Ser 0.76 0.61 - 1.24 mg/dL   Calcium 8.8 (L) 8.9 - 10.3 mg/dL   Total Protein 6.8 6.5 - 8.1 g/dL   Albumin 3.3 (L) 3.5 - 5.0 g/dL   AST 20 15 - 41 U/L   ALT 25 17 - 63 U/L   Alkaline Phosphatase 72 38 - 126 U/L   Total Bilirubin 1.0 0.3 - 1.2 mg/dL   GFR calc non Af Amer >60 >60 mL/min   GFR calc Af Amer >60 >60 mL/min    Comment: (NOTE) The eGFR has been calculated using the CKD EPI equation. This calculation has not been validated in all clinical situations. eGFR's persistently <60 mL/min signify possible Chronic Kidney Disease.    Anion gap 6 5 - 15  Magnesium     Status: None   Collection Time: 01/29/17  4:28 AM  Result Value Ref Range   Magnesium 2.0 1.7 - 2.4 mg/dL  Lipid panel     Status: Abnormal   Collection Time: 01/29/17  4:28 AM  Result Value Ref Range   Cholesterol 117 0 - 200 mg/dL   Triglycerides 55 <150 mg/dL   HDL 30 (L) >40 mg/dL   Total CHOL/HDL Ratio 3.9 RATIO   VLDL 11 0 - 40 mg/dL   LDL Cholesterol 76 0 - 99 mg/dL    Comment:        Total Cholesterol/HDL:CHD Risk Coronary Heart Disease Risk Table                     Men   Women  1/2 Average Risk   3.4   3.3  Average Risk       5.0   4.4  2 X Average Risk   9.6   7.1  3 X Average Risk  23.4   11.0        Use the calculated Patient Ratio above and the CHD Risk Table to determine the patient's CHD Risk.  ATP III CLASSIFICATION (LDL):  <100     mg/dL   Optimal  100-129  mg/dL   Near or Above                    Optimal  130-159  mg/dL   Borderline  160-189  mg/dL   High  >190     mg/dL   Very High     ABGS No results for input(s): PHART, PO2ART, TCO2, HCO3 in the last 72 hours.  Invalid input(s): PCO2 CULTURES Recent Results (from the past 240 hour(s))  MRSA PCR Screening     Status: None   Collection Time: 01/26/17  9:25 PM  Result Value Ref Range Status   MRSA by PCR NEGATIVE  NEGATIVE Final    Comment:        The GeneXpert MRSA Assay (FDA approved for NASAL specimens only), is one component of a comprehensive MRSA colonization surveillance program. It is not intended to diagnose MRSA infection nor to guide or monitor treatment for MRSA infections.    Studies/Results: Ct Chest W Contrast  Result Date: 01/28/2017 CLINICAL DATA:  Right upper lobe pulmonary nodule on chest radiograph. EXAM: CT CHEST WITH CONTRAST TECHNIQUE: Multidetector CT imaging of the chest was performed during intravenous contrast administration. CONTRAST:  35m ISOVUE-300 IOPAMIDOL (ISOVUE-300) INJECTION 61% COMPARISON:  Chest radiograph of 01/26/2017. FINDINGS: Cardiovascular: Aortic and branch vessel atherosclerosis. Normal heart size, without pericardial effusion. Lad and right coronary artery atherosclerosis. No central pulmonary embolism, on this non-dedicated study. Mediastinum/Nodes: No supraclavicular adenopathy. Right paratracheal adenopathy 1.5 cm on image 66/series 2. Adenopathy within the azygoesophageal recess measures 1.4 cm on image 81/series 2. Right hilar adenopathy at 3.0 cm on image 75/series 2. Lungs/Pleura: No pleural fluid. Corresponding to the plain film abnormality, within the right upper lobe laterally, is a spiculated mass which measures 3.3 by 2.7 cm on image 51/ series 4. This contacts the pleural surface laterally. 3 mm vague left upper lobe pulmonary nodule on image 71/series 4. Minimal dependent posterior left upper lobe atelectasis. Upper Abdomen: Moderate hepatic steatosis. Normal imaged portions of the spleen, stomach, pancreas, gallbladder, kidneys. Mild left adrenal thickening. Right adrenal nodularity, with the most well-defined component measuring maximally 2.6 cm on image 158/ series 2. Abdominal aortic and branch vessel atherosclerosis. Musculoskeletal: Moderate upper thoracic spondylosis. IMPRESSION: 1. Right upper lobe lung mass, consistent with primary  bronchogenic carcinoma. 2. Mediastinal and right hilar adenopathy, consistent with nodal metastasis. 3. Right adrenal nodularity which is indeterminate. Cannot exclude metastatic disease. 4. Consider nonemergent/outpatient PET for further staging. 5. Coronary artery atherosclerosis. Aortic Atherosclerosis (ICD10-I70.0). 6. Hepatic steatosis. Electronically Signed   By: KAbigail MiyamotoM.D.   On: 01/28/2017 07:51    Medications:  Prior to Admission:  Prescriptions Prior to Admission  Medication Sig Dispense Refill Last Dose  . amLODipine (NORVASC) 5 MG tablet Take 5 mg by mouth daily.   01/26/2017 at 1100  . atorvastatin (LIPITOR) 10 MG tablet Take 10 mg by mouth daily.   01/25/2017 at 1800  . famotidine (PEPCID) 10 MG tablet Take 10 mg by mouth as needed for heartburn or indigestion.   Past Week at Unknown time  . hydrochlorothiazide (HYDRODIURIL) 25 MG tablet Take 25 mg by mouth daily.   01/26/2017 at 1100  . ibuprofen (ADVIL,MOTRIN) 200 MG tablet Take 600-800 mg by mouth every 6 (six) hours as needed for mild pain.    01/25/2017 at Unknown time  . tamsulosin (FLOMAX) 0.4 MG CAPS capsule Take  0.4 mg by mouth daily.   01/26/2017 at 1100   Scheduled: . atorvastatin  10 mg Oral q1800  . diltiazem  60 mg Oral Q8H  . ipratropium-albuterol  3 mL Nebulization TID  . nicotine  21 mg Transdermal Daily  . tamsulosin  0.4 mg Oral Daily   Continuous: . 0.9 % NaCl with KCl 40 mEq / L 100 mL/hr (01/29/17 0120)  . ciprofloxacin Stopped (01/29/17 8337)  . metronidazole Stopped (01/29/17 0226)   OUZ:HQUIQNVVYXAJL, famotidine, ondansetron (ZOFRAN) IV  Assesment: He was admitted with atrial fibrillation rapid ventricular response felt to be related to diverticulitis with perforation.  He converted to sinus rhythm.  At baseline he has COPD.  He was found to have a mass in the right upper lobe that is highly suggestive of primary bronchogenic cancer.  This looks like it would be best approached with a needle  biopsy.  Outpatient PET scan may find a lesion that is easier to approach. Principal Problem:   Atrial fibrillation with RVR (HCC) Active Problems:   Diverticulitis of intestine with perforation   Mass of upper lobe of right lung   Hypokalemia   Adrenal mass (HCC)   Tobacco abuse   COPD (chronic obstructive pulmonary disease) (HCC)   Hypertension   BPH (benign prostatic hypertrophy) with urinary retention   High cholesterol   Chronic kidney disease   Hyponatremia   Leukocytosis   RLQ abdominal pain    Plan: I will plan to sign off.  He will need to be set up for outpatient PET scan then needle biopsy.  Thanks for allowing me to see him with you    LOS: 3 days   Crysten Kaman L 01/29/2017, 8:22 AM

## 2017-01-29 NOTE — Progress Notes (Signed)
Progress Note  Patient Name: Aaron Sellers Date of Encounter: 01/29/2017  Primary Cardiologist:  Dorris Carnes, MD  Subjective     Inpatient Medications    Scheduled Meds: . atorvastatin  10 mg Oral q1800  . diltiazem  60 mg Oral Q8H  . ipratropium-albuterol  3 mL Nebulization TID  . nicotine  21 mg Transdermal Daily  . tamsulosin  0.4 mg Oral Daily   Continuous Infusions: . 0.9 % NaCl with KCl 40 mEq / L 100 mL/hr (01/29/17 0120)  . ciprofloxacin Stopped (01/29/17 1308)  . metronidazole Stopped (01/29/17 0226)   PRN Meds: acetaminophen, famotidine, ondansetron (ZOFRAN) IV   Vital Signs    Vitals:   01/28/17 2129 01/29/17 0008 01/29/17 0508 01/29/17 0839  BP: 115/74 (!) 118/91 120/81   Pulse: 72 (!) 147 (!) 101   Resp: 20  20   Temp: 98.2 F (36.8 C) (!) 97.4 F (36.3 C) 98.2 F (36.8 C)   TempSrc: Oral Oral Oral   SpO2: 97% 94% 95% 96%  Weight:   288 lb 6.1 oz (130.8 kg)   Height:        Intake/Output Summary (Last 24 hours) at 01/29/17 0909 Last data filed at 01/29/17 0509  Gross per 24 hour  Intake             1380 ml  Output             1475 ml  Net              -95 ml   Filed Weights   01/27/17 0447 01/28/17 0500 01/29/17 0508  Weight: 282 lb 13.6 oz (128.3 kg) 288 lb 5.8 oz (130.8 kg) 288 lb 6.1 oz (130.8 kg)    Telemetry    One episode of atrial tachycardia rate of 139 bpm around 9:20 pm. Otherwise NSR. Personally Reviewed   Physical Exam   GEN: No acute distress.   Neck: No JVD Cardiac: RRR, no murmurs, rubs, or gallops.  Respiratory: Clear to auscultation bilaterally. GI: Soft, nontender, non-distended  MS: No edema; No deformity. Neuro:  Nonfocal  Psych: Normal affect   Labs    Chemistry Recent Labs Lab 01/26/17 1512 01/27/17 0501 01/28/17 0411 01/29/17 0428  NA 137 134* 135 134*  K 3.3* 3.3* 3.8 4.3  CL 99* 101 99* 104  CO2 '26 26 28 24  '$ GLUCOSE 120* 114* 105* 102*  BUN 21* '18 10 7  '$ CREATININE 0.87 0.77 0.80 0.76   CALCIUM 9.4 8.3* 8.3* 8.8*  PROT 7.4  --  6.1* 6.8  ALBUMIN 3.6  --  3.0* 3.3*  AST 19  --  15 20  ALT 24  --  21 25  ALKPHOS 70  --  55 72  BILITOT 1.7*  --  1.0 1.0  GFRNONAA >60 >60 >60 >60  GFRAA >60 >60 >60 >60  ANIONGAP '12 7 8 6     '$ Hematology Recent Labs Lab 01/27/17 0501 01/28/17 0411 01/29/17 0428  WBC 13.6* 12.3* 11.6*  RBC 5.04 4.86 5.17  HGB 14.5 13.8 15.0  HCT 43.4 42.2 45.3  MCV 86.1 86.8 87.6  MCH 28.8 28.4 29.0  MCHC 33.4 32.7 33.1  RDW 13.3 13.4 13.4  PLT 210 217 241    Cardiac Enzymes Recent Labs Lab 01/26/17 1512  TROPONINI <0.03      Radiology    Ct Chest W Contrast  Result Date: 01/28/2017 CLINICAL DATA:  Right upper lobe pulmonary nodule on chest radiograph. EXAM: CT  CHEST WITH CONTRAST TECHNIQUE: Multidetector CT imaging of the chest was performed during intravenous contrast administration. CONTRAST:  105m ISOVUE-300 IOPAMIDOL (ISOVUE-300) INJECTION 61% COMPARISON:  Chest radiograph of 01/26/2017. FINDINGS: Cardiovascular: Aortic and Shruti Arrey vessel atherosclerosis. Normal heart size, without pericardial effusion. Lad and right coronary artery atherosclerosis. No central pulmonary embolism, on this non-dedicated study. Mediastinum/Nodes: No supraclavicular adenopathy. Right paratracheal adenopathy 1.5 cm on image 66/series 2. Adenopathy within the azygoesophageal recess measures 1.4 cm on image 81/series 2. Right hilar adenopathy at 3.0 cm on image 75/series 2. Lungs/Pleura: No pleural fluid. Corresponding to the plain film abnormality, within the right upper lobe laterally, is a spiculated mass which measures 3.3 by 2.7 cm on image 51/ series 4. This contacts the pleural surface laterally. 3 mm vague left upper lobe pulmonary nodule on image 71/series 4. Minimal dependent posterior left upper lobe atelectasis. Upper Abdomen: Moderate hepatic steatosis. Normal imaged portions of the spleen, stomach, pancreas, gallbladder, kidneys. Mild left adrenal  thickening. Right adrenal nodularity, with the most well-defined component measuring maximally 2.6 cm on image 158/ series 2. Abdominal aortic and Iyana Topor vessel atherosclerosis. Musculoskeletal: Moderate upper thoracic spondylosis. IMPRESSION: 1. Right upper lobe lung mass, consistent with primary bronchogenic carcinoma. 2. Mediastinal and right hilar adenopathy, consistent with nodal metastasis. 3. Right adrenal nodularity which is indeterminate. Cannot exclude metastatic disease. 4. Consider nonemergent/outpatient PET for further staging. 5. Coronary artery atherosclerosis. Aortic Atherosclerosis (ICD10-I70.0). 6. Hepatic steatosis. Electronically Signed   By: KAbigail MiyamotoM.D.   On: 01/28/2017 07:51    Cardiac Studies   Echocardiogram 01/27/2017 Left ventricle: The cavity size was normal. Wall thickness was   normal. Systolic function was normal. The estimated ejection   fraction was in the range of 55% to 60%. Wall motion was normal;   there were no regional wall motion abnormalities. Left   ventricular diastolic function parameters were normal. - Aortic valve: There was trivial regurgitation.   Patient Profile     66y.o. male admitted with abdominal pain, with associated diarrhea and anorexia. Found to be in atrial flutter with 2:1 AV block and frequent PVC;s. Started on diltiazem gtt and converted to NSR. CHADS VASC Score of 2, but not placed on anticoagulation due to possible need for surgery or biopsy in light of abnormal  CT scan which determined that he had diverticulitis, possible perforation. and a RUL: lung mass worrisome for bronchiogenic carcinoma.   Assessment & Plan    1. Paroxysmal Aflutter with RVR: Remains in NSR. CHADS VASC Score of 2 2.% relative risk. Plan for PET scan is for OP. Can consider DOAC now, but may need biopsy post PET scan. Will discuss with Dr. BHarl Bowie Heart rate is essentially controlled now on diltiazem 60 mg Q 8 hours, with the exception of rapid rhythm  overnight. I discussed this with the patient and he states was having a coughing spell. Once it stopped he felt better.   2. Lung Mass:  Biopsy and PET scan planned as OP.   3. Diverticulitis: Followed by PCP team. On IV flagyl.      Signed, KPhill Myron LWest Pugh ANP, AACC   01/29/2017, 9:09 AM    Patient seen and discussed with DNP LPurcell Nails I agree with her documentation above. From cardiology standpoint has been managed for new onset paroxysmal aflutter. He converted to SR with IV diltiazem, now on oral dilt. Echo LVEF 55-60%, no WMAs, normal LA. Normal TSH. Will consolidate to long acting diltiazem '180mg'$  tomorrow AM. Can further titrate  dilt as needed, still with some occasional tachycardia that seem to be associated with coughing episodes.  We will hold on anticoag in setting of diverticulitis with perhaps focal contained perforation, as well as new diagnosis of RUL lung mass consistent with bronchogenic carcinoma with evidence of medistainal lymphnode mets and possible right adrenal met. Can reevaluate for anticoag as outpatient once patient GI issues have stabilized and cancer workup is complete including evaluate for brain METs. Of note has some noted CAD by CT scan, as well as small 3 cm AAA that will need to be monitored. We will sign off inpatient care, will need outpatient f/u cardiolgoy clinic 3 weeks after discharge. If remains in hospital we will f/u tele tomorrow.    Carlyle Dolly MD

## 2017-01-29 NOTE — Progress Notes (Signed)
Subjective: Patient states his abdominal pain has resolved.  Is having multiple bowel movements.  Denies any nausea or vomiting.  Tolerating full liquid diet well.  Objective: Vital signs in last 24 hours: Temp:  [97.4 F (36.3 C)-98.5 F (36.9 C)] 98.2 F (36.8 C) (10/30 0508) Pulse Rate:  [62-147] 101 (10/30 0508) Resp:  [13-27] 20 (10/30 0508) BP: (113-125)/(74-110) 120/81 (10/30 0508) SpO2:  [94 %-100 %] 95 % (10/30 0508) Weight:  [288 lb 6.1 oz (130.8 kg)] 288 lb 6.1 oz (130.8 kg) (10/30 0508) Last BM Date: 01/28/17  Intake/Output from previous day: 10/29 0701 - 10/30 0700 In: 1530 [P.O.:480; I.V.:950; IV Piggyback:100] Out: 2050 [Urine:2050] Intake/Output this shift: No intake/output data recorded.  General appearance: alert, cooperative and no distress GI: soft, non-tender; bowel sounds normal; no masses,  no organomegaly  Lab Results:   Recent Labs  01/28/17 0411 01/29/17 0428  WBC 12.3* 11.6*  HGB 13.8 15.0  HCT 42.2 45.3  PLT 217 241   BMET  Recent Labs  01/28/17 0411 01/29/17 0428  NA 135 134*  K 3.8 4.3  CL 99* 104  CO2 28 24  GLUCOSE 105* 102*  BUN 10 7  CREATININE 0.80 0.76  CALCIUM 8.3* 8.8*   PT/INR No results for input(s): LABPROT, INR in the last 72 hours.  Studies/Results: Ct Chest W Contrast  Result Date: 01/28/2017 CLINICAL DATA:  Right upper lobe pulmonary nodule on chest radiograph. EXAM: CT CHEST WITH CONTRAST TECHNIQUE: Multidetector CT imaging of the chest was performed during intravenous contrast administration. CONTRAST:  38mL ISOVUE-300 IOPAMIDOL (ISOVUE-300) INJECTION 61% COMPARISON:  Chest radiograph of 01/26/2017. FINDINGS: Cardiovascular: Aortic and branch vessel atherosclerosis. Normal heart size, without pericardial effusion. Lad and right coronary artery atherosclerosis. No central pulmonary embolism, on this non-dedicated study. Mediastinum/Nodes: No supraclavicular adenopathy. Right paratracheal adenopathy 1.5 cm on  image 66/series 2. Adenopathy within the azygoesophageal recess measures 1.4 cm on image 81/series 2. Right hilar adenopathy at 3.0 cm on image 75/series 2. Lungs/Pleura: No pleural fluid. Corresponding to the plain film abnormality, within the right upper lobe laterally, is a spiculated mass which measures 3.3 by 2.7 cm on image 51/ series 4. This contacts the pleural surface laterally. 3 mm vague left upper lobe pulmonary nodule on image 71/series 4. Minimal dependent posterior left upper lobe atelectasis. Upper Abdomen: Moderate hepatic steatosis. Normal imaged portions of the spleen, stomach, pancreas, gallbladder, kidneys. Mild left adrenal thickening. Right adrenal nodularity, with the most well-defined component measuring maximally 2.6 cm on image 158/ series 2. Abdominal aortic and branch vessel atherosclerosis. Musculoskeletal: Moderate upper thoracic spondylosis. IMPRESSION: 1. Right upper lobe lung mass, consistent with primary bronchogenic carcinoma. 2. Mediastinal and right hilar adenopathy, consistent with nodal metastasis. 3. Right adrenal nodularity which is indeterminate. Cannot exclude metastatic disease. 4. Consider nonemergent/outpatient PET for further staging. 5. Coronary artery atherosclerosis. Aortic Atherosclerosis (ICD10-I70.0). 6. Hepatic steatosis. Electronically Signed   By: Abigail Miyamoto M.D.   On: 01/28/2017 07:51    Anti-infectives: Anti-infectives    Start     Dose/Rate Route Frequency Ordered Stop   01/27/17 0600  ciprofloxacin (CIPRO) IVPB 400 mg     400 mg 200 mL/hr over 60 Minutes Intravenous Every 12 hours 01/26/17 2127     01/27/17 0200  metroNIDAZOLE (FLAGYL) IVPB 500 mg     500 mg 100 mL/hr over 60 Minutes Intravenous Every 8 hours 01/26/17 2127     01/26/17 1800  piperacillin-tazobactam (ZOSYN) IVPB 3.375 g  Status:  Discontinued  3.375 g 100 mL/hr over 30 Minutes Intravenous  Once 01/26/17 1748 01/26/17 1757   01/26/17 1800  metroNIDAZOLE (FLAGYL) IVPB 500  mg     500 mg 100 mL/hr over 60 Minutes Intravenous  Once 01/26/17 1748 01/26/17 1909   01/26/17 1800  ciprofloxacin (CIPRO) IVPB 400 mg     400 mg 200 mL/hr over 60 Minutes Intravenous  Once 01/26/17 1757 01/26/17 1909      Assessment/Plan: Impression: Sigmoid diverticulitis with contained microperforation, resolving.  Newly diagnosed right lung mass. Plan: We will advance to heart healthy diet.  Would have patient complete a full 2-week course of p.o. antibiotics.  No need for acute surgical intervention as this is his first episode.  LOS: 3 days    Aviva Signs 01/29/2017

## 2017-01-29 NOTE — Progress Notes (Signed)
Per MT, Patient had period of A-flutter with fast HR. Patient was getting breathing tx at that time. Patient denies symptoms. Patient resting in be at this time. No signs of distress noted. Will continue to monitor.

## 2017-01-30 DIAGNOSIS — I4891 Unspecified atrial fibrillation: Secondary | ICD-10-CM

## 2017-01-30 LAB — COMPREHENSIVE METABOLIC PANEL
ALT: 26 U/L (ref 17–63)
ANION GAP: 8 (ref 5–15)
AST: 20 U/L (ref 15–41)
Albumin: 3.1 g/dL — ABNORMAL LOW (ref 3.5–5.0)
Alkaline Phosphatase: 68 U/L (ref 38–126)
BUN: 8 mg/dL (ref 6–20)
CHLORIDE: 102 mmol/L (ref 101–111)
CO2: 26 mmol/L (ref 22–32)
Calcium: 8.6 mg/dL — ABNORMAL LOW (ref 8.9–10.3)
Creatinine, Ser: 0.84 mg/dL (ref 0.61–1.24)
Glucose, Bld: 108 mg/dL — ABNORMAL HIGH (ref 65–99)
POTASSIUM: 4.3 mmol/L (ref 3.5–5.1)
SODIUM: 136 mmol/L (ref 135–145)
Total Bilirubin: 1 mg/dL (ref 0.3–1.2)
Total Protein: 6.4 g/dL — ABNORMAL LOW (ref 6.5–8.1)

## 2017-01-30 LAB — CBC WITH DIFFERENTIAL/PLATELET
Basophils Absolute: 0 10*3/uL (ref 0.0–0.1)
Basophils Relative: 0 %
EOS ABS: 0.2 10*3/uL (ref 0.0–0.7)
EOS PCT: 2 %
HCT: 44.4 % (ref 39.0–52.0)
Hemoglobin: 14.7 g/dL (ref 13.0–17.0)
LYMPHS ABS: 1.9 10*3/uL (ref 0.7–4.0)
Lymphocytes Relative: 16 %
MCH: 28.4 pg (ref 26.0–34.0)
MCHC: 33.1 g/dL (ref 30.0–36.0)
MCV: 85.7 fL (ref 78.0–100.0)
MONO ABS: 1.2 10*3/uL — AB (ref 0.1–1.0)
Monocytes Relative: 10 %
Neutro Abs: 8.5 10*3/uL — ABNORMAL HIGH (ref 1.7–7.7)
Neutrophils Relative %: 72 %
PLATELETS: 242 10*3/uL (ref 150–400)
RBC: 5.18 MIL/uL (ref 4.22–5.81)
RDW: 13.3 % (ref 11.5–15.5)
WBC: 11.8 10*3/uL — AB (ref 4.0–10.5)

## 2017-01-30 LAB — MAGNESIUM: MAGNESIUM: 1.8 mg/dL (ref 1.7–2.4)

## 2017-01-30 MED ORDER — DILTIAZEM HCL ER COATED BEADS 240 MG PO CP24
240.0000 mg | ORAL_CAPSULE | Freq: Every day | ORAL | Status: DC
  Administered 2017-01-30: 240 mg via ORAL

## 2017-01-30 MED ORDER — DILTIAZEM HCL ER COATED BEADS 240 MG PO CP24
240.0000 mg | ORAL_CAPSULE | Freq: Every day | ORAL | Status: DC
  Filled 2017-01-30: qty 1

## 2017-01-30 MED ORDER — CIPROFLOXACIN HCL 500 MG PO TABS: 500.0000 mg | ORAL_TABLET | Freq: Two times a day (BID) | ORAL | 0 refills | Status: DC

## 2017-01-30 MED ORDER — METRONIDAZOLE 500 MG PO TABS: 500.0000 mg | ORAL_TABLET | Freq: Three times a day (TID) | ORAL | 0 refills | Status: DC

## 2017-01-30 MED ORDER — DILTIAZEM HCL ER COATED BEADS 240 MG PO CP24: 240.0000 mg | ORAL_CAPSULE | Freq: Every day | ORAL | 2 refills | Status: DC

## 2017-01-30 NOTE — Care Management Note (Addendum)
Case Management Note  Patient Details  Name: Aaron Sellers MRN: [id number] Date of Birth: 25-Feb-1951  Subjective/Objective:  Patient adm with Afib, RUL lung mass, and acute diverticulitis with perforation. Patient is from home with significant other. He goes to Dr. Judi Cong urgent care for primary care. He will need an outpatient pet scan for f/u on lung mass. Dr. Luan Pulling (pulmonolgy) consulted during hospital stay. Dr. Luan Pulling agrees to f/u  With Pet scan results. Will f/u with on scheduling PET scan.                   Action/Plan: CM following. Will arrange for outpatient pet scan. Dr. Luan Pulling will arrange for PET scan once patient has been discharged. Patient updated.   Expected Discharge Date:       01/31/2017           Expected Discharge Plan:  Home/Self Care  In-House Referral:     Discharge planning Services  CM Consult, Other - See comment  Post Acute Care Choice:    Choice offered to:     DME Arranged:    DME Agency:     HH Arranged:    HH Agency:     Status of Service:  Completed, signed off  If discussed at [employer] of Stay Meetings, dates discussed:    Additional Comments:  Kannon Baum, Chauncey Reading, RN 01/30/2017, 11:15 AM

## 2017-01-30 NOTE — Progress Notes (Signed)
Patient had another episode of A-flutter, HR 147 per tele. Patient was ambulating to bathroom and back to bed. Patient asymptomatic. Patient resting in bed at this time, HR back in the 60s. No distress noted. Will continue to monitor.

## 2017-01-30 NOTE — Progress Notes (Signed)
Still with runs of tachycardia by telemetry, we will increase his dilt to 240mg  daily. He is due for his dose now, follow rates into the afternoon   Carlyle Dolly MD

## 2017-01-30 NOTE — Discharge Summary (Signed)
Physician Discharge Summary  Aaron Sellers CNO:[id number] DOB: 03/12/1951 DOA: 01/26/2017  PCP: Sheryle Hail, PA-C  Admit date: 01/26/2017 Discharge date: 01/30/2017  Time spent: 45 minutes  Recommendations for Outpatient Follow-up:  -Patient will be discharged home today. -Follow-up appointments with PCP and with cardiology have been arranged.  Discharge Diagnoses:  Principal Problem:   Atrial fibrillation with RVR ([place]) Active Problems:   Diverticulitis of intestine with perforation   Mass of upper lobe of right lung   Hypokalemia   Adrenal mass ([facility])   Tobacco abuse   COPD (chronic obstructive pulmonary disease) (HCC)   Hypertension   BPH (benign prostatic hypertrophy) with urinary retention   High cholesterol   Chronic kidney disease   Hyponatremia   Leukocytosis   RLQ abdominal pain   Benign prostatic hyperplasia with urinary retention   Lung mass   Atrial flutter [facility])   Discharge Condition:    Filed Weights   01/27/17 0447 01/28/17 0500 01/29/17 0508  Weight: 128.3 kg (282 lb 13.6 oz) 130.8 kg (288 lb 5.8 oz) 130.8 kg (288 lb 6.1 oz)    History of present illness:  As per Dr. Nehemiah Settle on 10/27:  Aaron Sellers is a 66 y.o. male with a history of COPD, tobacco abuse, high cholesterol, hypertension not currently on medications.  Patient seen for 3 days of worsening abdominal pain in the right lower quadrant.  Pain is nonradiating, but accompanied by diminished appetite.  He denies nausea, vomiting, diarrhea, constipation, hematochezia, melena.  He has been feeling more tired over the past day or 2.  Patient went to urgent care and had an EKG done due to tachycardia.  Due to the arrhythmia, the patient was sent to the hospital for evaluation.  Hospital Course:   Atrial flutter/atrial fibrillation with RVR -Initially required IV diltiazem, placed on extended release Cardizem, dose increased to 240 mg due to residual tachycardia.  Since increased heart rate  has been in the 70s-80s. -TSH is normal. -Echocardiogram reveals an ejection fraction of 29-47% with no diastolic dysfunction and no regional wall abnormalities.  Acute diverticulitis with perforation -Clinically improving, seen by Dr. Arnoldo Morale, is tolerating solid diet. -Plan for 2 weeks of Cipro and Flagyl per surgical recommendations.  Right upper lobe lung mass -Seen by Dr. Luan Pulling, he will need PET scan which will be accomplished as an outpatient and scheduled by Dr. Luan Pulling office.  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  Tobacco abuse -Counseled on smoking cessation.  Procedures:  None   Consultations:  Surgery  Cardiology  Discharge Instructions  Discharge Instructions    Diet - low sodium heart healthy    Complete by:  As directed    Increase activity slowly    Complete by:  As directed      Allergies as of 01/30/2017      Reactions   Adhesive [tape] Other (See Comments)   Skin peels   Codeine Other (See Comments)   Jitters, "skin crawling"      Medication List    STOP taking these medications   amLODipine 5 MG tablet Commonly known as:  NORVASC   hydrochlorothiazide 25 MG tablet Commonly known as:  HYDRODIURIL     TAKE these medications   atorvastatin 10 MG tablet Commonly known as:  LIPITOR Take 10 mg by mouth daily.   ciprofloxacin 500 MG tablet Commonly known as:  CIPRO Take 1 tablet (500 mg total) by mouth 2 (two) times daily.   diltiazem 240  MG 24 hr capsule Commonly known as:  CARDIZEM CD Take 1 capsule (240 mg total) by mouth daily.   famotidine 10 MG tablet Commonly known as:  PEPCID Take 10 mg by mouth as needed for heartburn or indigestion.   ibuprofen 200 MG tablet Commonly known as:  ADVIL,MOTRIN Take 600-800 mg by mouth every 6 (six) hours as needed for mild pain.   metroNIDAZOLE 500 MG tablet Commonly known as:  FLAGYL Take 1 tablet (500 mg total) by mouth every 8 (eight) hours.   tamsulosin 0.4 MG Caps  capsule Commonly known as:  FLOMAX Take 0.4 mg by mouth daily.      Allergies  Allergen Reactions  . Adhesive [Tape] Other (See Comments)    Skin peels  . Codeine Other (See Comments)    Jitters, "skin crawling"   Follow-up Information    Lendon Colonel, NP Follow up on 02/12/2017.   Specialties:  Nurse Practitioner, Radiology, Cardiology Why:  2 pm. Please bring all medications with you to appoiintment.  Contact information: [place] [address] [phone]        Sheryle Hail, PA-C. Schedule an appointment as soon as possible for a visit in 1 week(s).   Specialty:  Physician Assistant Contact information: [address] [place] [address] [phone]        Sinda Du, MD. Schedule an appointment as soon as possible for a visit in 2 week(s).   Specialty:  Pulmonary Disease Contact information: [place] [address] [phone]            The results of significant diagnostics from this hospitalization (including imaging, microbiology, ancillary and laboratory) are listed below for reference.    Significant Diagnostic Studies: Dg Chest 2 View  Result Date: 01/26/2017 CLINICAL DATA:  Irregular heartbeat.  Fatigue.  Anorexia. EXAM: CHEST  2 VIEW COMPARISON:  10/21/2014 FINDINGS: The heart size and mediastinal contours are within normal limits. No evidence of pulmonary consolidation or pleural effusion. Mild right lateral pleural thickening is stable. A new irregular masslike opacity is seen in the right upper lobe measuring approximately 3 cm. This is suspicious for neoplasm. IMPRESSION: New 3 mm irregular masslike opacity in right upper lobe, suspicious for neoplasm. Chest CT with contrast is recommended for further evaluation. Electronically Signed   By: Earle Gell M.D.   On: 01/26/2017 17:34   Ct Chest W Contrast  Result Date: 01/28/2017 CLINICAL DATA:  Right upper lobe pulmonary nodule  on chest radiograph. EXAM: CT CHEST WITH CONTRAST TECHNIQUE: Multidetector CT imaging of the chest was performed during intravenous contrast administration. CONTRAST:  31mL ISOVUE-300 IOPAMIDOL (ISOVUE-300) INJECTION 61% COMPARISON:  Chest radiograph of 01/26/2017. FINDINGS: Cardiovascular: Aortic and branch vessel atherosclerosis. Normal heart size, without pericardial effusion. Lad and right coronary artery atherosclerosis. No central pulmonary embolism, on this non-dedicated study. Mediastinum/Nodes: No supraclavicular adenopathy. Right paratracheal adenopathy 1.5 cm on image 66/series 2. Adenopathy within the azygoesophageal recess measures 1.4 cm on image 81/series 2. Right hilar adenopathy at 3.0 cm on image 75/series 2. Lungs/Pleura: No pleural fluid. Corresponding to the plain film abnormality, within the right upper lobe laterally, is a spiculated mass which measures 3.3 by 2.7 cm on image 51/ series 4. This contacts the pleural surface laterally. 3 mm vague left upper lobe pulmonary nodule on image 71/series 4. Minimal dependent posterior left upper lobe atelectasis. Upper Abdomen: Moderate hepatic steatosis. Normal imaged portions of the spleen, stomach, pancreas, gallbladder, kidneys. Mild  left adrenal thickening. Right adrenal nodularity, with the most well-defined component measuring maximally 2.6 cm on image 158/ series 2. Abdominal aortic and branch vessel atherosclerosis. Musculoskeletal: Moderate upper thoracic spondylosis. IMPRESSION: 1. Right upper lobe lung mass, consistent with primary bronchogenic carcinoma. 2. Mediastinal and right hilar adenopathy, consistent with nodal metastasis. 3. Right adrenal nodularity which is indeterminate. Cannot exclude metastatic disease. 4. Consider nonemergent/outpatient PET for further staging. 5. Coronary artery atherosclerosis. Aortic Atherosclerosis (ICD10-I70.0). 6. Hepatic steatosis. Electronically Signed   By: Abigail Miyamoto M.D.   On: 01/28/2017 07:51    Ct Abdomen Pelvis W Contrast  Result Date: 01/26/2017 CLINICAL DATA:  Lower abdominal pain EXAM: CT ABDOMEN AND PELVIS WITH CONTRAST TECHNIQUE: Multidetector CT imaging of the abdomen and pelvis was performed using the standard protocol following bolus administration of intravenous contrast. CONTRAST:  155mL ISOVUE-300 IOPAMIDOL (ISOVUE-300) INJECTION 61% COMPARISON:  None. FINDINGS: Lower chest: Bases demonstrate no acute consolidation or pleural effusion. Borderline heart size. Mild coronary calcification. Hepatobiliary: No focal liver abnormality is seen. No gallstones, gallbladder wall thickening, or biliary dilatation. Pancreas: Unremarkable. No pancreatic ductal dilatation or surrounding inflammatory changes. Spleen: Normal in size without focal abnormality. Adrenals/Urinary Tract: Left adrenal gland appears normal. 2.8 cm enhancing mass in the right adrenal gland. Nonspecific perinephric fat stranding. 6 mm stone in the mid right kidney. 2.1 cm cyst in the right kidney. Punctate nonobstructing stone in the mid left kidney. Bladder within normal limits. Stomach/Bowel: The stomach is collapsed. No dilated small bowel. Normal appendix. Diverticular disease of the colon, most heavily concentrated at the sigmoid colon which is redundant. Focal wall thickening and moderate surrounding inflammation at the distal sigmoid colon consistent with diverticulitis. Irregular gas collection adjacent to the sigmoid colon is suspect for contained perforation. No abscess. Vascular/Lymphatic: Extensive atherosclerotic calcifications of the aorta duct. 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 No significantly enlarged lymph nodes. Reproductive: Prostate is unremarkable. Other: Negative for free air or significant free fluid. Musculoskeletal: Degenerative changes of the spine. No acute osseous abnormality. IMPRESSION: 1. Focal wall thickening and moderate surrounding inflammation involving the  distal sigmoid colon with diverticula present, findings would be most consistent with an acute diverticulitis. Suspected small extraluminal gas collection adjacent to the inflamed sigmoid colon concerning for focal contained perforation. No free air or abscess. 2. 2.8 cm enhancing mass in the right adrenal gland, possible adenoma, however recommend non emergent adrenal CT protocol to evaluate 3. Nonobstructing stones within both kidneys. 4. Small aortic aneurysm measuring 3 cm. Recommend followup by ultrasound in 3 years. This recommendation follows ACR consensus guidelines: White Paper of the ACR Incidental Findings Committee II on Vascular Findings. Natasha [facility] Coll Radiol 2013; 10:789-794 Electronically Signed   By: Donavan Foil M.D.   On: 01/26/2017 17:30    Microbiology: Recent Results (from the past 240 hour(s))  MRSA PCR Screening     Status: None   Collection Time: 01/26/17  9:25 PM  Result Value Ref Range Status   MRSA by PCR NEGATIVE NEGATIVE Final    Comment:        The GeneXpert MRSA Assay (FDA approved for NASAL specimens only), is one component of a comprehensive MRSA colonization surveillance program. It is not intended to diagnose MRSA infection nor to guide or monitor treatment for MRSA infections.      Labs: Basic Metabolic Panel:  Recent Labs Lab 01/26/17 1512 01/27/17 0501 01/28/17 0411 01/29/17 0428 01/30/17 0442  NA 137 134* 135 134* 136  K 3.3* 3.3* 3.8 4.3 4.3  CL 99* 101 99* 104 102  CO2 26 26 28 24 26   GLUCOSE 120* 114* 105* 102* 108*  BUN 21* 18 10 7 8   CREATININE 0.87 0.77 0.80 0.76 0.84  CALCIUM 9.4 8.3* 8.3* 8.8* 8.6*  MG  --  1.7 2.1 2.0 1.8   Liver Function Tests:  Recent Labs Lab 01/26/17 1512 01/28/17 0411 01/29/17 0428 01/30/17 0442  AST 19 15 20 20   ALT 24 21 25 26   ALKPHOS 70 55 72 68  BILITOT 1.7* 1.0 1.0 1.0  PROT 7.4 6.1* 6.8 6.4*  ALBUMIN 3.6 3.0* 3.3* 3.1*    Recent Labs Lab 01/26/17 1512  LIPASE 18   No results for  input(s): AMMONIA in the last 168 hours. CBC:  Recent Labs Lab 01/26/17 1458 01/27/17 0501 01/28/17 0411 01/29/17 0428 01/30/17 0442  WBC 19.2* 13.6* 12.3* 11.6* 11.8*  NEUTROABS  --   --  9.1* 8.1* 8.5*  HGB 15.9 14.5 13.8 15.0 14.7  HCT 46.1 43.4 42.2 45.3 44.4  MCV 85.2 86.1 86.8 87.6 85.7  PLT 210 210 217 241 242   Cardiac Enzymes:  Recent Labs Lab 01/26/17 1512  TROPONINI <0.03   BNP: BNP (last 3 results) No results for input(s): BNP in the last 8760 hours.  ProBNP (last 3 results) No results for input(s): PROBNP in the last 8760 hours.  CBG: No results for input(s): GLUCAP in the last 168 hours.     SignedLelon Frohlich  [facility] Pager: [phone] 01/30/2017, 5:04 PM

## 2017-01-30 NOTE — Progress Notes (Signed)
Discharge instructions read to patient and his friend Both verbalized understanding of all instructions.  Discharged to home with friend.

## 2017-01-30 NOTE — Progress Notes (Signed)
OT Cancellation Note  Patient Details Name: Aaron Sellers MRN: [id number] DOB: 1950-07-16   Cancelled Treatment:    Reason Eval/Treat Not Completed: OT screened, no needs identified, will sign off. Chart reviewed, pt screened for OT needs. Pt is independent in all B/ADLs and functional mobility. Has shower seat available at home if needed. No further OT needs at this time.   Guadelupe Sabin, OTR/L  [phone] 01/30/2017, 8:27 AM

## 2017-02-04 ENCOUNTER — Other Ambulatory Visit (HOSPITAL_COMMUNITY): Payer: Self-pay | Admitting: Pulmonary Disease

## 2017-02-04 DIAGNOSIS — R918 Other nonspecific abnormal finding of lung field: Secondary | ICD-10-CM

## 2017-02-11 NOTE — Progress Notes (Signed)
Cardiology Office Note   Date:  02/12/2017   ID:  Aaron Sellers, DOB June 27, 1950, MRN [id number]  PCP:  Sheryle Hail, PA-C  Cardiologist: Wyatt Haste, MD Chief Complaint  Patient presents with  . Hospitalization Follow-up  . Atrial Fibrillation      History of Present Illness: Aaron Sellers is a 66 y.o. male who presents for ongoing assessment and management of his hospitalization for paroxysmal atrial flutter with RVR.  During hospitalization the patient was also found to have a lung mass with biopsy and PET scan planned as an outpatient.  Patient also was treated for diverticulitis.  Her to discharge, the patient had runs of tachycardia by telemetry.  Diltiazem was increased to 240 mg daily.  On discharge the patient was in normal sinus rhythm.  He was not placed on anticoagulation therapy in the setting of upcoming biopsy for a lung mass.  The patient comes today without any further complaints of abdominal pain, rapid heart rhythm, or shortness of breath.  He is due to have a PET scan on 02/14/2017.  Wife is anxious to get things moving so that they are aware of the next steps concerning his lung mass.  Past Medical History:  Diagnosis Date  . Arthritis   . BPH (benign prostatic hypertrophy) with urinary retention   . Chronic kidney disease    hx of kidney stones  2011  . COPD (chronic obstructive pulmonary disease) ([place])    has been smoking since he was 22 ish--  . High cholesterol   . Hypertension    dx around 2011    Past Surgical History:  Procedure Laterality Date  . HERNIA REPAIR     umbilical hernia  06/863  . INCISION AND DRAINAGE ABSCESS     "down the middle" of his buttocks  2015     Current Outpatient Medications  Medication Sig Dispense Refill  . atorvastatin (LIPITOR) 10 MG tablet Take 10 mg by mouth daily.    Marland Kitchen diltiazem (CARDIZEM CD) 240 MG 24 hr capsule Take 1 capsule (240 mg total) by mouth daily. 30 capsule 2  . diphenhydrAMINE (BENADRYL) 25 MG tablet  Take 25 mg every 6 (six) hours as needed by mouth.    . famotidine (PEPCID) 10 MG tablet Take 10 mg by mouth as needed for heartburn or indigestion.    . hydrocortisone 2.5 % cream Apply 2 (two) times daily topically.    Marland Kitchen ibuprofen (ADVIL,MOTRIN) 200 MG tablet Take 600-800 mg by mouth every 6 (six) hours as needed for mild pain.     . metroNIDAZOLE (FLAGYL) 500 MG tablet Take 1 tablet (500 mg total) by mouth every 8 (eight) hours. 42 tablet 0  . tamsulosin (FLOMAX) 0.4 MG CAPS capsule Take 0.4 mg by mouth daily.     No current facility-administered medications for this visit.     Allergies:   Ciprofloxacin; Adhesive [tape]; and Codeine    Social History:  The patient  reports that he has been smoking cigarettes.  He has a 40.00 pack-year smoking history. He has quit using smokeless tobacco. His smokeless tobacco use included snuff. He reports that he does not drink alcohol or use drugs.   Family History:  The patient's family history includes CVA in his father; Hypertension in his brother, mother, and sister; Lung cancer in his brother and sister.    ROS: All other systems are reviewed and negative. Unless otherwise mentioned in H&P    PHYSICAL EXAM: VS:  BP 126/68 (BP  Location: Left Arm)   Pulse 80   Ht 6\' 7"  (2.007 m)   Wt 284 lb (128.8 kg)   SpO2 96%   BMI 31.99 kg/m  , BMI Body mass index is 31.99 kg/m. GEN: Well nourished, well developed, in no acute distress  HEENT: normal  Neck: no JVD, carotid bruits, or masses Cardiac: RRR; no murmurs, rubs, or gallops,no edema  Respiratory:  clear to auscultation bilaterally, normal work of breathing GI: soft, nontender, nondistended, + BS MS: no deformity or atrophy  Skin: warm and dry, rash over trunk and abdomen along with his back Neuro:  Strength and sensation are intact Psych: euthymic mood, full affect  Recent Labs: 2017/02/09: TSH 2.088 01/30/2017: ALT 26; BUN 8; Creatinine, Ser 0.84; Hemoglobin 14.7; Magnesium 1.8;  Platelets 242; Potassium 4.3; Sodium 136    Lipid Panel    Component Value Date/Time   CHOL 117 01/29/2017 0428   TRIG 55 01/29/2017 0428   HDL 30 (L) 01/29/2017 0428   CHOLHDL 3.9 01/29/2017 0428   VLDL 11 01/29/2017 0428   LDLCALC 76 01/29/2017 0428      Wt Readings from Last 3 Encounters:  02/12/17 284 lb (128.8 kg)  01/29/17 288 lb 6.1 oz (130.8 kg)  10/29/14 285 lb (129.3 kg)      Other studies Reviewed: Echocardiogram 02-09-2017 Left ventricle: The cavity size was normal. Wall thickness was   normal. Systolic function was normal. The estimated ejection   fraction was in the range of 55% to 60%. Wall motion was normal;   there were no regional wall motion abnormalities. Left   ventricular diastolic function parameters were normal. - Aortic valve: There was trivial regurgitation.  ASSESSMENT AND PLAN:  1.  Paroxysmal  atrial fibrillation: Heart rate remains normal, he denies any recurrence of palpitations or dyspnea.  I will continue diltiazem for now.  We will not begin anticoagulation until results of PET scan and plans for biopsies are formulated.  2.  Right upper lobe lung mass: This was found incidentally on CT scan to rule out PE.  He was found to also have mediastinal and right hilar adenopathy consistent with nodal metastasis.  Patient is scheduled for a PET scan, with further recommendations and treatment with oncology and pulmonology once his tests are finalized.  His wife is very concerned about the test results and getting them quickly.  She is now signed up for my chart so she has access to his records.  In the interim the patient is advised to remain out of work until April 03, 2017.  A letter is provided for him.  Once further testing and treatment plan is made, adjustments to this can be formulated.  Will defer to primary care and/or oncology in this endeavor.  Current medicines are reviewed at length with the patient today.    Labs/ tests ordered today  include:  Phill Myron. [place] Pugh, ANP, AACC   02/12/2017 2:15 PM    [facility] [address], [place], [place] [address] Phone: [phone]; Fax: [phone]

## 2017-02-12 ENCOUNTER — Encounter (INDEPENDENT_AMBULATORY_CARE_PROVIDER_SITE_OTHER): Payer: Self-pay

## 2017-02-12 ENCOUNTER — Encounter: Payer: Self-pay | Admitting: Adult Health

## 2017-02-12 ENCOUNTER — Ambulatory Visit: Payer: Managed Care, Other (non HMO) | Admitting: Adult Health

## 2017-02-12 VITALS — BP 126/68 | HR 80 | Ht 79.0 in | Wt 284.0 lb

## 2017-02-12 DIAGNOSIS — I10 Essential (primary) hypertension: Secondary | ICD-10-CM | POA: Diagnosis not present

## 2017-02-12 DIAGNOSIS — R918 Other nonspecific abnormal finding of lung field: Secondary | ICD-10-CM | POA: Diagnosis not present

## 2017-02-12 DIAGNOSIS — I480 Paroxysmal atrial fibrillation: Secondary | ICD-10-CM

## 2017-02-12 NOTE — Patient Instructions (Signed)
Medication Instructions:  START HYDROCORTISONE CREAM - APPLY TWICE DAILY AS NEEDED FOR ITCHING   Labwork: NONE  Testing/Procedures: NONE  Follow-Up: Your physician recommends that you schedule a follow-up appointment in: 3 MONTHS   Any Other Special Instructions Will Be Listed Below (If Applicable).     If you need a refill on your cardiac medications before your next appointment, please call your pharmacy.

## 2017-02-14 ENCOUNTER — Other Ambulatory Visit (HOSPITAL_COMMUNITY): Payer: Managed Care, Other (non HMO)

## 2017-02-14 ENCOUNTER — Encounter (HOSPITAL_COMMUNITY): Payer: Self-pay

## 2017-02-14 ENCOUNTER — Ambulatory Visit (HOSPITAL_COMMUNITY): Payer: Managed Care, Other (non HMO)

## 2017-02-15 ENCOUNTER — Encounter (HOSPITAL_COMMUNITY)
Admission: RE | Admit: 2017-02-15 | Discharge: 2017-02-15 | Disposition: A | Discharge status: Home / Self Care | Payer: Managed Care, Other (non HMO) | Source: Ambulatory Visit | Attending: Pulmonary Disease | Admitting: Pulmonary Disease

## 2017-02-15 DIAGNOSIS — R918 Other nonspecific abnormal finding of lung field: Secondary | ICD-10-CM | POA: Diagnosis present

## 2017-02-15 LAB — GLUCOSE, CAPILLARY: Glucose-Capillary: 98 mg/dL (ref 65–99)

## 2017-02-15 MED ORDER — FLUDEOXYGLUCOSE F - 18 (FDG) INJECTION
14.0000 | Freq: Once | INTRAVENOUS | Status: AC | PRN
  Administered 2017-02-15: 14 via INTRAVENOUS

## 2017-02-18 ENCOUNTER — Ambulatory Visit (HOSPITAL_COMMUNITY)
Admission: RE | Admit: 2017-02-18 | Discharge: 2017-02-18 | Disposition: A | Discharge status: Home / Self Care | Payer: Managed Care, Other (non HMO) | Source: Ambulatory Visit | Attending: Pulmonary Disease | Admitting: Pulmonary Disease

## 2017-02-18 ENCOUNTER — Other Ambulatory Visit (HOSPITAL_COMMUNITY): Payer: Self-pay | Admitting: Pulmonary Disease

## 2017-02-18 DIAGNOSIS — R2241 Localized swelling, mass and lump, right lower limb: Secondary | ICD-10-CM | POA: Insufficient documentation

## 2017-02-18 DIAGNOSIS — M79604 Pain in right leg: Secondary | ICD-10-CM | POA: Insufficient documentation

## 2017-02-19 ENCOUNTER — Encounter: Payer: Self-pay | Admitting: *Deleted

## 2017-02-19 ENCOUNTER — Other Ambulatory Visit (HOSPITAL_COMMUNITY): Payer: Self-pay | Admitting: Pulmonary Disease

## 2017-02-19 ENCOUNTER — Telehealth: Payer: Self-pay | Admitting: *Deleted

## 2017-02-19 DIAGNOSIS — R221 Localized swelling, mass and lump, neck: Secondary | ICD-10-CM

## 2017-02-19 DIAGNOSIS — R918 Other nonspecific abnormal finding of lung field: Secondary | ICD-10-CM

## 2017-02-19 DIAGNOSIS — R2241 Localized swelling, mass and lump, right lower limb: Secondary | ICD-10-CM

## 2017-02-19 NOTE — Telephone Encounter (Signed)
Oncology Nurse Navigator Documentation  Oncology Nurse Navigator Flowsheets 02/19/2017  Navigator Location [facility]  Referral date to RadOnc/MedOnc 02/19/2017  Navigator Encounter Type Telephone/I received referral on [name] today. I called and scheduled him to be seen next week on 02/27/17.  I then called Dr. Kathaleen Grinder office to update them on appt.   Telephone Outgoing Call  Treatment Phase Abnormal Scans  Barriers/Navigation Needs Coordination of Care  Interventions Coordination of Care  Coordination of Care Appts  Acuity Level 1  Time Spent with Patient 30

## 2017-02-20 ENCOUNTER — Other Ambulatory Visit: Payer: Self-pay | Admitting: Radiology

## 2017-02-20 ENCOUNTER — Other Ambulatory Visit: Payer: Self-pay | Admitting: Student

## 2017-02-22 ENCOUNTER — Encounter (HOSPITAL_COMMUNITY): Payer: Self-pay

## 2017-02-22 ENCOUNTER — Other Ambulatory Visit (HOSPITAL_COMMUNITY): Payer: Self-pay | Admitting: Pulmonary Disease

## 2017-02-22 ENCOUNTER — Ambulatory Visit (HOSPITAL_COMMUNITY)
Admission: RE | Admit: 2017-02-22 | Discharge: 2017-02-22 | Disposition: A | Discharge status: Home / Self Care | Payer: Managed Care, Other (non HMO) | Source: Ambulatory Visit | Attending: Pulmonary Disease | Admitting: Pulmonary Disease

## 2017-02-22 DIAGNOSIS — R221 Localized swelling, mass and lump, neck: Secondary | ICD-10-CM

## 2017-02-22 DIAGNOSIS — C7989 Secondary malignant neoplasm of other specified sites: Secondary | ICD-10-CM | POA: Diagnosis not present

## 2017-02-22 DIAGNOSIS — R2241 Localized swelling, mass and lump, right lower limb: Secondary | ICD-10-CM

## 2017-02-22 DIAGNOSIS — I129 Hypertensive chronic kidney disease with stage 1 through stage 4 chronic kidney disease, or unspecified chronic kidney disease: Secondary | ICD-10-CM | POA: Diagnosis not present

## 2017-02-22 DIAGNOSIS — F17210 Nicotine dependence, cigarettes, uncomplicated: Secondary | ICD-10-CM | POA: Diagnosis not present

## 2017-02-22 DIAGNOSIS — C3491 Malignant neoplasm of unspecified part of right bronchus or lung: Secondary | ICD-10-CM | POA: Diagnosis not present

## 2017-02-22 DIAGNOSIS — E7800 Pure hypercholesterolemia, unspecified: Secondary | ICD-10-CM | POA: Insufficient documentation

## 2017-02-22 DIAGNOSIS — N189 Chronic kidney disease, unspecified: Secondary | ICD-10-CM | POA: Diagnosis not present

## 2017-02-22 DIAGNOSIS — J449 Chronic obstructive pulmonary disease, unspecified: Secondary | ICD-10-CM | POA: Diagnosis not present

## 2017-02-22 LAB — CBC
HCT: 44.4 % (ref 39.0–52.0)
HEMOGLOBIN: 14.8 g/dL (ref 13.0–17.0)
MCH: 28.5 pg (ref 26.0–34.0)
MCHC: 33.3 g/dL (ref 30.0–36.0)
MCV: 85.5 fL (ref 78.0–100.0)
PLATELETS: 227 10*3/uL (ref 150–400)
RBC: 5.19 MIL/uL (ref 4.22–5.81)
RDW: 13.3 % (ref 11.5–15.5)
WBC: 9.9 10*3/uL (ref 4.0–10.5)

## 2017-02-22 LAB — APTT: APTT: 28 s (ref 24–36)

## 2017-02-22 LAB — PROTIME-INR
INR: 0.99
PROTHROMBIN TIME: 13 s (ref 11.4–15.2)

## 2017-02-22 MED ORDER — SODIUM CHLORIDE 0.9 % IV SOLN: INTRAVENOUS | Status: DC

## 2017-02-22 MED ORDER — LIDOCAINE HCL (PF) 1 % IJ SOLN
INTRAMUSCULAR | Status: AC
  Filled 2017-02-22: qty 30

## 2017-02-22 MED ORDER — ACETAMINOPHEN 500 MG PO TABS
500.0000 mg | ORAL_TABLET | Freq: Four times a day (QID) | ORAL | Status: DC | PRN
  Filled 2017-02-22: qty 1

## 2017-02-22 MED ORDER — MIDAZOLAM HCL 2 MG/2ML IJ SOLN
INTRAMUSCULAR | Status: AC | PRN
  Administered 2017-02-22 (×3): 1 mg via INTRAVENOUS

## 2017-02-22 MED ORDER — MIDAZOLAM HCL 2 MG/2ML IJ SOLN
INTRAMUSCULAR | Status: AC
  Filled 2017-02-22: qty 4

## 2017-02-22 MED ORDER — FENTANYL CITRATE (PF) 100 MCG/2ML IJ SOLN
INTRAMUSCULAR | Status: AC
  Filled 2017-02-22: qty 4

## 2017-02-22 MED ORDER — FENTANYL CITRATE (PF) 100 MCG/2ML IJ SOLN
INTRAMUSCULAR | Status: AC | PRN
  Administered 2017-02-22 (×2): 50 ug via INTRAVENOUS

## 2017-02-22 NOTE — H&P (Signed)
Chief Complaint: Patient was seen in consultation today for right neck mass and right thigh mass biopsy at the request of Hawkins,Edward  Referring Physician(s): Hawkins,Edward  Supervising Physician: Markus Daft  Patient Status: [facility] - Out-pt  History of Present Illness: Aaron Sellers is a 66 y.o. male   Noticed right thigh mass approx 2 months ago ++Enlarging Seen at [facility] 1 month ago for tired/weakness Arrhythmia Upon work up: RUL mass was found Continued work up revealed: 01/27/17: IMPRESSION: 1. Right upper lobe lung mass, consistent with primary bronchogenic carcinoma. 2. Mediastinal and right hilar adenopathy, consistent with nodal metastasis. 3. Right adrenal nodularity which is indeterminate. Cannot exclude metastatic disease.  PET: IMPRESSION: 1. The patient's right upper lobe pulmonary mass is FDG avid consistent with a primary malignancy. There is FDG avid metastatic disease to a right level 2 cervical lymph node, to right mediastinal nodes, and a right hilar node. 2. The right adrenal mass demonstrates an attenuation of less than 0 Hounsfield units posteriorly and the level of uptake in the right adrenal gland is slightly less than hepatic background suggesting the right adrenal nodule is an adenoma rather than a metastasis. Recommend attention on follow-up. 3. The region of focal sigmoid thickening and adjacent fat stranding with a suspected small extraluminal gas collection likely represents continued sequela of diverticulitis given the recent CT scan  Dr Luan Pulling requesting biopsy of right neck mass and right thigh mass This has been approved    Past Medical History:  Diagnosis Date  . Arthritis   . BPH (benign prostatic hypertrophy) with urinary retention   . Chronic kidney disease    hx of kidney stones  2011  . COPD (chronic obstructive pulmonary disease) ([place])    has been smoking since he was 22 ish--  . High cholesterol   . Hypertension      dx around 2011    Past Surgical History:  Procedure Laterality Date  . HERNIA REPAIR     umbilical hernia  04/3242  . INCISION AND DRAINAGE ABSCESS     "down the middle" of his buttocks  2015  . TOTAL KNEE ARTHROPLASTY Left 10/29/2014   Procedure: TOTAL KNEE ARTHROPLASTY;  Surgeon: Dorna Leitz, MD;  Location: [place];  Service: Orthopedics;  Laterality: Left;    Allergies: Ciprofloxacin; Adhesive [tape]; and Codeine  Medications: Prior to Admission medications   Medication Sig Start Date End Date Taking? Authorizing Provider  atorvastatin (LIPITOR) 10 MG tablet Take 10 mg by mouth daily.    [provider]  diltiazem (CARDIZEM CD) 240 MG 24 hr capsule Take 1 capsule (240 mg total) by mouth daily. 01/31/17   Isaac Bliss, Rayford Halsted, MD  diphenhydrAMINE (BENADRYL) 25 MG tablet Take 25 mg every 6 (six) hours as needed by mouth.    [provider]  famotidine (PEPCID) 10 MG tablet Take 10 mg by mouth as needed for heartburn or indigestion.    [provider]  hydrocortisone 2.5 % cream Apply 1 application topically 2 (two) times daily as needed (skin irritations).     [provider]  ibuprofen (ADVIL,MOTRIN) 200 MG tablet Take 600-800 mg by mouth every 6 (six) hours as needed for mild pain.     [provider]  tamsulosin (FLOMAX) 0.4 MG CAPS capsule Take 0.4 mg by mouth daily.    [provider]     Family History  Problem Relation Age of Onset  . Hypertension Mother   . CVA Father   .  Lung cancer Sister   . Lung cancer Brother   . Hypertension Brother   . Hypertension Sister     Social History   Socioeconomic History  . Marital status: Single    Spouse name: None  . Number of children: None  . Years of education: None  . Highest education level: None  Social Needs  . Financial resource strain: None  . Food insecurity - worry: None  . Food insecurity - inability: None  . Transportation needs - medical: None  .  Transportation needs - non-medical: None  Occupational History  . None  Tobacco Use  . Smoking status: Current Every Day Smoker    Packs/day: 1.00    Years: 40.00    Pack years: 40.00    Types: Cigarettes  . Smokeless tobacco: Former Systems developer    Types: Snuff  Substance and Sexual Activity  . Alcohol use: No  . Drug use: No  . Sexual activity: None  Other Topics Concern  . None  Social History Narrative  . None    Review of Systems: A 12 point ROS discussed and pertinent positives are indicated in the HPI above.  All other systems are negative.  Review of Systems  Constitutional: Negative for activity change, fatigue and fever.  Respiratory: Negative for cough and shortness of breath.   Cardiovascular: Negative for chest pain.  Gastrointestinal: Negative for abdominal pain.  Musculoskeletal: Negative for back pain and gait problem.  Neurological: Positive for weakness.  Psychiatric/Behavioral: Negative for confusion and decreased concentration.    Vital Signs: BP (!) 147/90 (BP Location: Right Arm)   Pulse 62   Temp 97.6 F (36.4 C) (Oral)   Ht 6\' 7"  (2.007 m)   Wt 285 lb (129.3 kg)   SpO2 95%   BMI 32.11 kg/m   Physical Exam  Constitutional: He is oriented to person, place, and time.  Cardiovascular: Normal rate and regular rhythm.  Pulmonary/Chest: Effort normal and breath sounds normal. He has no wheezes.  Abdominal: Soft. Bowel sounds are normal.  Musculoskeletal: Normal range of motion.  Palpable mass in right inner thigh approx 4 cm in size NT   Neurological: He is alert and oriented to person, place, and time.  Skin: Skin is warm and dry.  Psychiatric: He has a normal mood and affect. His behavior is normal. Judgment and thought content normal.  Nursing note and vitals reviewed.   Imaging: Dg Chest 2 View  Result Date: 01/26/2017 CLINICAL DATA:  Irregular heartbeat.  Fatigue.  Anorexia. EXAM: CHEST  2 VIEW COMPARISON:  10/21/2014 FINDINGS: The heart  size and mediastinal contours are within normal limits. No evidence of pulmonary consolidation or pleural effusion. Mild right lateral pleural thickening is stable. A new irregular masslike opacity is seen in the right upper lobe measuring approximately 3 cm. This is suspicious for neoplasm. IMPRESSION: New 3 mm irregular masslike opacity in right upper lobe, suspicious for neoplasm. Chest CT with contrast is recommended for further evaluation. Electronically Signed   By: Earle Gell M.D.   On: 01/26/2017 17:34   Ct Chest W Contrast  Result Date: 01/28/2017 CLINICAL DATA:  Right upper lobe pulmonary nodule on chest radiograph. EXAM: CT CHEST WITH CONTRAST TECHNIQUE: Multidetector CT imaging of the chest was performed during intravenous contrast administration. CONTRAST:  59mL ISOVUE-300 IOPAMIDOL (ISOVUE-300) INJECTION 61% COMPARISON:  Chest radiograph of 01/26/2017. FINDINGS: Cardiovascular: Aortic and branch vessel atherosclerosis. Normal heart size, without pericardial effusion. Lad and right coronary artery atherosclerosis. No central pulmonary  embolism, on this non-dedicated study. Mediastinum/Nodes: No supraclavicular adenopathy. Right paratracheal adenopathy 1.5 cm on image 66/series 2. Adenopathy within the azygoesophageal recess measures 1.4 cm on image 81/series 2. Right hilar adenopathy at 3.0 cm on image 75/series 2. Lungs/Pleura: No pleural fluid. Corresponding to the plain film abnormality, within the right upper lobe laterally, is a spiculated mass which measures 3.3 by 2.7 cm on image 51/ series 4. This contacts the pleural surface laterally. 3 mm vague left upper lobe pulmonary nodule on image 71/series 4. Minimal dependent posterior left upper lobe atelectasis. Upper Abdomen: Moderate hepatic steatosis. Normal imaged portions of the spleen, stomach, pancreas, gallbladder, kidneys. Mild left adrenal thickening. Right adrenal nodularity, with the most well-defined component measuring maximally  2.6 cm on image 158/ series 2. Abdominal aortic and branch vessel atherosclerosis. Musculoskeletal: Moderate upper thoracic spondylosis. IMPRESSION: 1. Right upper lobe lung mass, consistent with primary bronchogenic carcinoma. 2. Mediastinal and right hilar adenopathy, consistent with nodal metastasis. 3. Right adrenal nodularity which is indeterminate. Cannot exclude metastatic disease. 4. Consider nonemergent/outpatient PET for further staging. 5. Coronary artery atherosclerosis. Aortic Atherosclerosis (ICD10-I70.0). 6. Hepatic steatosis. Electronically Signed   By: Abigail Miyamoto M.D.   On: 01/28/2017 07:51   Ct Abdomen Pelvis W Contrast  Result Date: 01/26/2017 CLINICAL DATA:  Lower abdominal pain EXAM: CT ABDOMEN AND PELVIS WITH CONTRAST TECHNIQUE: Multidetector CT imaging of the abdomen and pelvis was performed using the standard protocol following bolus administration of intravenous contrast. CONTRAST:  128mL ISOVUE-300 IOPAMIDOL (ISOVUE-300) INJECTION 61% COMPARISON:  None. FINDINGS: Lower chest: Bases demonstrate no acute consolidation or pleural effusion. Borderline heart size. Mild coronary calcification. Hepatobiliary: No focal liver abnormality is seen. No gallstones, gallbladder wall thickening, or biliary dilatation. Pancreas: Unremarkable. No pancreatic ductal dilatation or surrounding inflammatory changes. Spleen: Normal in size without focal abnormality. Adrenals/Urinary Tract: Left adrenal gland appears normal. 2.8 cm enhancing mass in the right adrenal gland. Nonspecific perinephric fat stranding. 6 mm stone in the mid right kidney. 2.1 cm cyst in the right kidney. Punctate nonobstructing stone in the mid left kidney. Bladder within normal limits. Stomach/Bowel: The stomach is collapsed. No dilated small bowel. Normal appendix. Diverticular disease of the colon, most heavily concentrated at the sigmoid colon which is redundant. Focal wall thickening and moderate surrounding inflammation at  the distal sigmoid colon consistent with diverticulitis. Irregular gas collection adjacent to the sigmoid colon is suspect for contained perforation. No abscess. Vascular/Lymphatic: Extensive atherosclerotic calcifications of the aorta duct. 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 No significantly enlarged lymph nodes. Reproductive: Prostate is unremarkable. Other: Negative for free air or significant free fluid. Musculoskeletal: Degenerative changes of the spine. No acute osseous abnormality. IMPRESSION: 1. Focal wall thickening and moderate surrounding inflammation involving the distal sigmoid colon with diverticula present, findings would be most consistent with an acute diverticulitis. Suspected small extraluminal gas collection adjacent to the inflamed sigmoid colon concerning for focal contained perforation. No free air or abscess. 2. 2.8 cm enhancing mass in the right adrenal gland, possible adenoma, however recommend non emergent adrenal CT protocol to evaluate 3. Nonobstructing stones within both kidneys. 4. Small aortic aneurysm measuring 3 cm. Recommend followup by ultrasound in 3 years. This recommendation follows ACR consensus guidelines: White Paper of the ACR Incidental Findings Committee II on Vascular Findings. Natasha Mead Coll Radiol 2013; 10:789-794 Electronically Signed   By: Donavan Foil M.D.   On: 01/26/2017 17:30   Nm Pet Image Initial (pi) Skull Base To  Thigh  Result Date: 02/16/2017 CLINICAL DATA:  Evaluate lung mass. EXAM: NUCLEAR MEDICINE PET SKULL BASE TO THIGH TECHNIQUE: 14.0 mCi F-18 FDG was injected intravenously. Full-ring PET imaging was performed from the skull base to thigh after the radiotracer. CT data was obtained and used for attenuation correction and anatomic localization. FASTING BLOOD GLUCOSE:  Value: 98 mg/dl COMPARISON:  CT scan of the chest January 27, 2017. CT scan of the abdomen and pelvis January 26, 2017. FINDINGS: NECK: There is increased  uptake in a lymph node just deep to the right submandibular gland on series 4, image 23. The lymph node measures 10 mm in short axis with a maximum SUV of 11.3. Uptake in a left cervical facet on image 29 is consistent with degenerative change. Mild uptake in the left parotid gland on image 20 is likely either a node within the parotid or benign lesion with low level uptake and a maximum SUV of 3.8. No other suspicious uptake in the head or neck regions. CHEST: The mass in the right upper lobe measures 3.2 by 2.6 cm on series 8, image 23, not changed in the interval with a maximum SUV of 13.5, consistent with malignancy. FDG avid metastatic nodes are seen in the right paratracheal region on image 62, to the right of the carina on image 69, and in the right hilum on image 76. The right hilar node measures 3 cm with a maximum SUV of 30. No other FDG avid disease in the chest. ABDOMEN/PELVIS: The patient has known right adrenal nodule is again identified measuring up to 3.2 cm, unchanged. On precontrast images, the posterior aspect of the nodule demonstrates an attenuation of less than 0 Hounsfield units. There is low level uptake in the anteromedial aspect of the right adrenal gland on image 118 with a maximum SUV of 3.7 compare 2 background hepatic activity of 4.0 as measured on image 110. The majority of the right adrenal gland demonstrates no uptake. The focal wall thickening and surrounding fat stranding associated with the sigmoid colon seen on series 4, images 163 173 has improved in the interval. The suspected small extraluminal gas collection adjacent the inflamed sigmoid colon persists but is smaller in the interval. There is focal FDG uptake in this region with a maximum SUV of 15.3. No other abnormal uptake in the soft tissues of the abdomen. Mild uptake adjacent to the left ischium is likely degenerative. No other abnormal uptake in the pelvis. SKELETON: No focal hypermetabolic activity to suggest skeletal  metastasis. IMPRESSION: 1. The patient's right upper lobe pulmonary mass is FDG avid consistent with a primary malignancy. There is FDG avid metastatic disease to a right level 2 cervical lymph node, to right mediastinal nodes, and a right hilar node. 2. The right adrenal mass demonstrates an attenuation of less than 0 Hounsfield units posteriorly and the level of uptake in the right adrenal gland is slightly less than hepatic background suggesting the right adrenal nodule is an adenoma rather than a metastasis. Recommend attention on follow-up. 3. The region of focal sigmoid thickening and adjacent fat stranding with a suspected small extraluminal gas collection likely represents continued sequela of diverticulitis given the recent CT scan. Recommend follow-up to complete resolution of the patient's symptoms and imaging findings. A colonoscopy can of course better evaluate the underlying colon if clinically warranted. Electronically Signed   By: Dorise Bullion III M.D   On: 02/16/2017 07:40   US Venous Img Lower Unilateral Right  Result Date: 02/18/2017  CLINICAL DATA:  Right lower extremity pain with palpable mass. Recent diagnosis of right upper lobe lung mass with abnormal metabolic activity by PET scan and evidence of metastatic disease to right cervical lymph node, mediastinal nodes and a right hilar node. EXAM: RIGHT LOWER EXTREMITY VENOUS DOPPLER ULTRASOUND TECHNIQUE: Gray-scale sonography with graded compression, as well as color Doppler and duplex ultrasound were performed to evaluate the lower extremity deep venous systems from the level of the common femoral vein and including the common femoral, femoral, profunda femoral, popliteal and calf veins including the posterior tibial, peroneal and gastrocnemius veins when visible. The superficial great saphenous vein was also interrogated. Spectral Doppler was utilized to evaluate flow at rest and with distal augmentation maneuvers in the common femoral,  femoral and popliteal veins. COMPARISON:  None. FINDINGS: Contralateral Common Femoral Vein: Respiratory phasicity is normal and symmetric with the symptomatic side. No evidence of thrombus. Normal compressibility. Common Femoral Vein: No evidence of thrombus. Normal compressibility, respiratory phasicity and response to augmentation. Saphenofemoral Junction: No evidence of thrombus. Normal compressibility and flow on color Doppler imaging. Profunda Femoral Vein: No evidence of thrombus. Normal compressibility and flow on color Doppler imaging. Femoral Vein: No evidence of thrombus. Normal compressibility, respiratory phasicity and response to augmentation. Popliteal Vein: No evidence of thrombus. Normal compressibility, respiratory phasicity and response to augmentation. Calf Veins: No evidence of thrombus. Normal compressibility and flow on color Doppler imaging. Superficial Great Saphenous Vein: No evidence of thrombus. Normal compressibility. Venous Reflux:  None. Other Findings: No evidence of superficial thrombophlebitis. There is a discrete abnormal soft tissue mass in the mid thigh measuring up to 7.8 x 3.6 x 5.3 cm. This is concerning for a soft tissue metastasis given the presence of a lung mass and lymph node metastatic disease in the chest and neck by PET scan. IMPRESSION: 1. No evidence of right lower extremity DVT. 2. Soft tissue mass of the right mid thigh measuring nearly 8 cm in greatest diameter and appearing likely malignant by ultrasound. This is concerning for a soft tissue metastasis from lung carcinoma based on recent PET scan findings. Consider biopsy of this mass versus the right cervical lymph node metastasis for tissue diagnosis. Electronically Signed   By: Aletta Edouard M.D.   On: 02/18/2017 16:35    Labs:  CBC: Recent Labs    01/27/17 0501 01/28/17 0411 01/29/17 0428 01/30/17 0442  WBC 13.6* 12.3* 11.6* 11.8*  HGB 14.5 13.8 15.0 14.7  HCT 43.4 42.2 45.3 44.4  PLT 210 217  241 242    COAGS: No results for input(s): INR, APTT in the last 8760 hours.  BMP: Recent Labs    01/27/17 0501 01/28/17 0411 01/29/17 0428 01/30/17 0442  NA 134* 135 134* 136  K 3.3* 3.8 4.3 4.3  CL 101 99* 104 102  CO2 26 28 24 26   GLUCOSE 114* 105* 102* 108*  BUN 18 10 7 8   CALCIUM 8.3* 8.3* 8.8* 8.6*  CREATININE 0.77 0.80 0.76 0.84  GFRNONAA >60 >60 >60 >60  GFRAA >60 >60 >60 >60    LIVER FUNCTION TESTS: Recent Labs    01/26/17 1512 01/28/17 0411 01/29/17 0428 01/30/17 0442  BILITOT 1.7* 1.0 1.0 1.0  AST 19 15 20 20   ALT 24 21 25 26   ALKPHOS 70 55 72 68  PROT 7.4 6.1* 6.8 6.4*  ALBUMIN 3.6 3.0* 3.3* 3.1*    TUMOR MARKERS: No results for input(s): AFPTM, CEA, CA199, CHROMGRNA in the last 8760 hours.  Assessment  and Plan:  New rt lung mass +PET + LAN; +PET Scheduled for right neck mass biopsy Also enlarging right thigh mass For biopsy of this today also Risks and benefits discussed with the patient including, but not limited to bleeding, infection, damage to adjacent structures or low yield requiring additional tests. All of the patient's questions were answered, patient is agreeable to proceed. Consent signed and in chart.  Thank you for this interesting consult.  I greatly enjoyed meeting Aaron Sellers and look forward to participating in their care.  A copy of this report was sent to the requesting provider on this date.  Electronically Signed: Lavonia Drafts, PA-C 02/22/2017, 12:08 PM   I spent a total of  30 Minutes   in face to face in clinical consultation, greater than 50% of which was counseling/coordinating care for right neck mass and right thigh mass biopsy

## 2017-02-22 NOTE — Sedation Documentation (Signed)
Bandaid and ice pack to R neck

## 2017-02-22 NOTE — Sedation Documentation (Signed)
Patient is resting comfortably. 

## 2017-02-22 NOTE — Discharge Instructions (Signed)
Needle Biopsy, Care After These instructions give you information about caring for yourself after your procedure. Your doctor may also give you more specific instructions. Call your doctor if you have any problems or questions after your procedure. Follow these instructions at home:  Rest as told by your doctor.  Take medicines only as told by your doctor.  There are many different ways to close and cover the biopsy site, including stitches (sutures), skin glue, and adhesive strips. Follow instructions from your doctor about: ? How to take care of your biopsy site. ? When and how you should change your bandage (dressing). ? When you should remove your dressing. ? Removing whatever was used to close your biopsy site.  Check your biopsy site every day for signs of infection. Watch for: ? Redness, swelling, or pain. ? Fluid, blood, or pus. Contact a doctor if:  You have a fever.  You have redness, swelling, or pain at the biopsy site, and it lasts longer than a few days.  You have fluid, blood, or pus coming from the biopsy site.  You feel sick to your stomach (nauseous).  You throw up (vomit). Get help right away if:  You are short of breath.  You have trouble breathing.  Your chest hurts.  You feel dizzy or you pass out (faint).  You have bleeding that does not stop with pressure or a bandage.  You cough up blood.  Your belly (abdomen) hurts. This information is not intended to replace advice given to you by your health care provider. Make sure you discuss any questions you have with your health care provider. Document Released: 03/01/2008 Document Revised: 08/25/2015 Document Reviewed: 03/15/2014 Elsevier Interactive Patient Education  [employer].

## 2017-02-22 NOTE — Sedation Documentation (Signed)
Proceeding with R thigh bx

## 2017-02-22 NOTE — Procedures (Signed)
US guided FNA of right upper cervical lymph node, 7 FNAs obtained. US guided core biopsies of right thigh soft tissue mass, 4 cores obtained. Minimal blood loss and no immediate complication.

## 2017-02-22 NOTE — Sedation Documentation (Signed)
Bandaid R thigh

## 2017-02-27 ENCOUNTER — Encounter: Payer: Self-pay | Admitting: Internal Medicine

## 2017-02-27 ENCOUNTER — Ambulatory Visit (HOSPITAL_BASED_OUTPATIENT_CLINIC_OR_DEPARTMENT_OTHER): Payer: Managed Care, Other (non HMO) | Admitting: Internal Medicine

## 2017-02-27 ENCOUNTER — Other Ambulatory Visit (HOSPITAL_BASED_OUTPATIENT_CLINIC_OR_DEPARTMENT_OTHER): Payer: Managed Care, Other (non HMO)

## 2017-02-27 ENCOUNTER — Telehealth: Payer: Self-pay | Admitting: Internal Medicine

## 2017-02-27 ENCOUNTER — Encounter: Payer: Self-pay | Admitting: Radiation Oncology

## 2017-02-27 ENCOUNTER — Encounter: Payer: Self-pay | Admitting: *Deleted

## 2017-02-27 VITALS — BP 131/79 | HR 54 | Temp 97.9°F | Resp 18 | Ht 79.0 in | Wt 289.4 lb

## 2017-02-27 DIAGNOSIS — I4891 Unspecified atrial fibrillation: Secondary | ICD-10-CM | POA: Diagnosis not present

## 2017-02-27 DIAGNOSIS — Z5111 Encounter for antineoplastic chemotherapy: Secondary | ICD-10-CM

## 2017-02-27 DIAGNOSIS — Z5112 Encounter for antineoplastic immunotherapy: Secondary | ICD-10-CM

## 2017-02-27 DIAGNOSIS — R918 Other nonspecific abnormal finding of lung field: Secondary | ICD-10-CM

## 2017-02-27 DIAGNOSIS — C3411 Malignant neoplasm of upper lobe, right bronchus or lung: Secondary | ICD-10-CM

## 2017-02-27 DIAGNOSIS — N189 Chronic kidney disease, unspecified: Secondary | ICD-10-CM | POA: Diagnosis not present

## 2017-02-27 DIAGNOSIS — R5382 Chronic fatigue, unspecified: Secondary | ICD-10-CM

## 2017-02-27 DIAGNOSIS — C3491 Malignant neoplasm of unspecified part of right bronchus or lung: Secondary | ICD-10-CM

## 2017-02-27 DIAGNOSIS — Z7189 Other specified counseling: Secondary | ICD-10-CM

## 2017-02-27 LAB — COMPREHENSIVE METABOLIC PANEL
ALBUMIN: 3.4 g/dL — AB (ref 3.5–5.0)
ALK PHOS: 79 U/L (ref 40–150)
ALT: 18 U/L (ref 0–55)
ANION GAP: 7 meq/L (ref 3–11)
AST: 14 U/L (ref 5–34)
BILIRUBIN TOTAL: 0.57 mg/dL (ref 0.20–1.20)
BUN: 15.2 mg/dL (ref 7.0–26.0)
CO2: 27 meq/L (ref 22–29)
CREATININE: 0.9 mg/dL (ref 0.7–1.3)
Calcium: 9.6 mg/dL (ref 8.4–10.4)
Chloride: 104 mEq/L (ref 98–109)
EGFR: >60 mL/min/{1.73_m2} (ref >60)
Glucose: 116 mg/dl (ref 70–140)
Potassium: 4.3 mEq/L (ref 3.5–5.1)
Sodium: 138 mEq/L (ref 136–145)
TOTAL PROTEIN: 7.1 g/dL (ref 6.4–8.3)

## 2017-02-27 LAB — CBC WITH DIFFERENTIAL/PLATELET
BASO%: 1.1 % (ref 0.0–2.0)
Basophils Absolute: 0.1 10*3/uL (ref 0.0–0.1)
EOS ABS: 0.3 10*3/uL (ref 0.0–0.5)
EOS%: 3.6 % (ref 0.0–7.0)
HEMATOCRIT: 43.4 % (ref 38.4–49.9)
HEMOGLOBIN: 14.2 g/dL (ref 13.0–17.1)
LYMPH#: 2.1 10*3/uL (ref 0.9–3.3)
LYMPH%: 22.4 % (ref 14.0–49.0)
MCH: 27.9 pg (ref 27.2–33.4)
MCHC: 32.8 g/dL (ref 32.0–36.0)
MCV: 85.3 fL (ref 79.3–98.0)
MONO#: 0.9 10*3/uL (ref 0.1–0.9)
MONO%: 9.4 % (ref 0.0–14.0)
NEUT%: 63.5 % (ref 39.0–75.0)
NEUTROS ABS: 6 10*3/uL (ref 1.5–6.5)
PLATELETS: 232 10*3/uL (ref 140–400)
RBC: 5.09 10*6/uL (ref 4.20–5.82)
RDW: 13.9 % (ref 11.0–14.6)
WBC: 9.5 10*3/uL (ref 4.0–10.3)

## 2017-02-27 MED ORDER — PROCHLORPERAZINE MALEATE 10 MG PO TABS: 10.0000 mg | ORAL_TABLET | Freq: Four times a day (QID) | ORAL | 0 refills | Status: DC | PRN

## 2017-02-27 NOTE — Progress Notes (Signed)
[place] [phone]   [phone]  CONSULT NOTE  REFERRING PHYSICIAN: Dr. Sinda Du  REASON FOR CONSULTATION:  66 years old white male recently diagnosed with lung cancer.  HPI Aaron Sellers is a 66 y.o. male with long history of his smoking and past medical history significant for COPD, hypertension, dyslipidemia, benign prostatic hypertrophy, osteoarthritis, atrial fibrillation as well as chronic kidney disease.  The patient mentioned that he was complaining of right lower quadrant abdominal pain as well as lack of appetite for 3 days.  He was seen by his primary care physician and was sent to any pain hospital for evaluation.  He was found to have acute diverticulitis at that time that was managed with antibiotics.  He was also found to have atrial fibrillation with rapid ventricular rate.  Chest x-ray during his evaluation on 01/26/2017 showed new 3.0 cm irregular masslike opacity in the right upper lobe suspicious for neoplasm.  This was followed by CT scan of the chest with contrast on January 27, 2017 and it showed spiculated mass within the right upper lobe laterally measuring 3.3 x 2.7 cm with contacts of the pleural surface laterally.  There was also 0.3 cm vague left upper lobe pulmonary nodule.  The scan also showed right paratracheal adenopathy measuring 1.5 cm, adenopathy within the as ago esophageal recess measuring 1.4 cm and right hilar adenopathy measuring 3.0 cm.  A PET scan on February 16, 2017 showed hypermetabolic right upper lobe pulmonary mass consistent with primary malignancy.  There was also FDG avid metastatic disease to the right level 2 cervical lymph node as well as the right mediastinal nodes and right hilar lymph nodes.  The right adrenal mass seen on the previous CT scan was highly suggestive of adenoma rather than metastasis.  There was no focal hypermetabolic activity to suggest skeletal metastases but the patient was  complaining of pain and questionable lump in the inner side of his right thigh.  Ultrasound Doppler of the right lower extremity showed soft tissue mass of the right mid thigh measuring nearly 8 cm in greatest dimension and appearing likely malignant by ultrasound.  On February 22, 2017 the patient underwent ultrasound core biopsy of the right cervical lymph node and the final pathology was negative for malignancy.  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 Dr. Luan Pulling kindly referred the patient to me today for evaluation and recommendation regarding treatment of his condition. When seen today the patient continues to complain of pain in the medial right thigh.  He denied having any chest pain, shortness of breath but has mild cough with no hemoptysis.  He denied having any weight loss or night sweats.  He has no headache or visual changes.  He had few episodes of diarrhea recently.  He denied having any nausea, vomiting or constipation. Family history significant for brother and sister with lung cancer, mother had hypertension and heart disease and father had a stroke and leukemia. The patient is single and has 2 children.  He was accompanied by his longtime partner, Aaron Sellers.  He used to work as a Administrator.  He has a history for smoking 1 pack/day for around 50 years and quit 8 months ago.  He has no history of alcohol or drug abuse.   HPI  Past Medical History:  Diagnosis Date  . Arthritis   .  BPH (benign prostatic hypertrophy) with urinary retention   . Chronic kidney disease    hx of kidney stones  2011  . COPD (chronic obstructive pulmonary disease) ([place])    has been smoking since he was 22 ish--  . High cholesterol   . Hypertension    dx around 2011    Past Surgical History:  Procedure Laterality Date  . HERNIA REPAIR     umbilical hernia  10/8586    . INCISION AND DRAINAGE ABSCESS     "down the middle" of his buttocks  2015  . TOTAL KNEE ARTHROPLASTY Left 10/29/2014   Procedure: TOTAL KNEE ARTHROPLASTY;  Surgeon: Dorna Leitz, MD;  Location: [place];  Service: Orthopedics;  Laterality: Left;    Family History  Problem Relation Age of Onset  . Hypertension Mother   . CVA Father   . Lung cancer Sister   . Lung cancer Brother   . Hypertension Brother   . Hypertension Sister     Social History Social History   Tobacco Use  . Smoking status: Current Every Day Smoker    Packs/day: 1.00    Years: 40.00    Pack years: 40.00    Types: Cigarettes  . Smokeless tobacco: Former Systems developer    Types: Snuff  Substance Use Topics  . Alcohol use: No  . Drug use: No    Allergies  Allergen Reactions  . Ciprofloxacin     HIVES  . Adhesive [Tape] Other (See Comments)    Skin peels  . Codeine Other (See Comments)    Jitters, "skin crawling"    Current Outpatient Medications  Medication Sig Dispense Refill  . atorvastatin (LIPITOR) 10 MG tablet Take 10 mg by mouth daily.    Marland Kitchen diltiazem (CARDIZEM CD) 240 MG 24 hr capsule Take 1 capsule (240 mg total) by mouth daily. 30 capsule 2  . diphenhydrAMINE (BENADRYL) 25 MG tablet Take 25 mg every 6 (six) hours as needed by mouth.    . famotidine (PEPCID) 10 MG tablet Take 10 mg by mouth as needed for heartburn or indigestion.    . hydrocortisone 2.5 % cream Apply 1 application topically 2 (two) times daily as needed (skin irritations).     Marland Kitchen ibuprofen (ADVIL,MOTRIN) 200 MG tablet Take 600-800 mg by mouth every 6 (six) hours as needed for mild pain.     . tamsulosin (FLOMAX) 0.4 MG CAPS capsule Take 0.4 mg by mouth daily.     No current facility-administered medications for this visit.     Review of Systems  Constitutional: positive for fatigue Eyes: negative Ears, nose, mouth, throat, and face: negative Respiratory: positive for cough Cardiovascular: negative Gastrointestinal:  negative Genitourinary:negative Integument/breast: negative Hematologic/lymphatic: negative Musculoskeletal:positive for bone pain Neurological: negative Behavioral/Psych: negative Endocrine: negative Allergic/Immunologic: negative  Physical Exam  FOY:DXAJO, healthy, no distress, well nourished, well developed and anxious SKIN: skin color, texture, turgor are normal, no rashes or significant lesions HEAD: Normocephalic, No masses, lesions, tenderness or abnormalities EYES: normal, PERRLA, Conjunctiva are pink and non-injected EARS: External ears normal, Canals clear OROPHARYNX:no exudate, no erythema and lips, buccal mucosa, and tongue normal  NECK: supple, no adenopathy, no JVD LYMPH:  no palpable lymphadenopathy, no hepatosplenomegaly LUNGS: clear to auscultation , and palpation HEART: regular rate & rhythm, no murmurs and no gallops ABDOMEN:abdomen soft, non-tender, normal bowel sounds and no masses or organomegaly BACK: Back symmetric, no curvature., No CVA tenderness EXTREMITIES:no joint deformities, effusion, or inflammation, no edema, no skin discoloration  NEURO: alert &  oriented x 3 with fluent speech, no focal motor/sensory deficits  PERFORMANCE STATUS: ECOG 1  LABORATORY DATA: Lab Results  Component Value Date   WBC 9.5 02/27/2017   HGB 14.2 02/27/2017   HCT 43.4 02/27/2017   MCV 85.3 02/27/2017   PLT 232 02/27/2017      Chemistry      Component Value Date/Time   NA 138 02/27/2017 1022   K 4.3 02/27/2017 1022   CL 102 01/30/2017 0442   CO2 27 02/27/2017 1022   BUN 15.2 02/27/2017 1022   CREATININE 0.9 02/27/2017 1022      Component Value Date/Time   CALCIUM 9.6 02/27/2017 1022   ALKPHOS 79 02/27/2017 1022   AST 14 02/27/2017 1022   ALT 18 02/27/2017 1022   BILITOT 0.57 02/27/2017 1022       RADIOGRAPHIC STUDIES: Nm Pet Image Initial (pi) Skull Base To Thigh  Result Date: 02/16/2017 CLINICAL DATA:  Evaluate lung mass. EXAM: NUCLEAR MEDICINE PET  SKULL BASE TO THIGH TECHNIQUE: 14.0 mCi F-18 FDG was injected intravenously. Full-ring PET imaging was performed from the skull base to thigh after the radiotracer. CT data was obtained and used for attenuation correction and anatomic localization. FASTING BLOOD GLUCOSE:  Value: 98 mg/dl COMPARISON:  CT scan of the chest January 27, 2017. CT scan of the abdomen and pelvis January 26, 2017. FINDINGS: NECK: There is increased uptake in a lymph node just deep to the right submandibular gland on series 4, image 23. The lymph node measures 10 mm in short axis with a maximum SUV of 11.3. Uptake in a left cervical facet on image 29 is consistent with degenerative change. Mild uptake in the left parotid gland on image 20 is likely either a node within the parotid or benign lesion with Sellers level uptake and a maximum SUV of 3.8. No other suspicious uptake in the head or neck regions. CHEST: The mass in the right upper lobe measures 3.2 by 2.6 cm on series 8, image 23, not changed in the interval with a maximum SUV of 13.5, consistent with malignancy. FDG avid metastatic nodes are seen in the right paratracheal region on image 62, to the right of the carina on image 69, and in the right hilum on image 76. The right hilar node measures 3 cm with a maximum SUV of 30. No other FDG avid disease in the chest. ABDOMEN/PELVIS: The patient has known right adrenal nodule is again identified measuring up to 3.2 cm, unchanged. On precontrast images, the posterior aspect of the nodule demonstrates an attenuation of less than 0 Hounsfield units. There is Sellers level uptake in the anteromedial aspect of the right adrenal gland on image 118 with a maximum SUV of 3.7 compare 2 background hepatic activity of 4.0 as measured on image 110. The majority of the right adrenal gland demonstrates no uptake. The focal wall thickening and surrounding fat stranding associated with the sigmoid colon seen on series 4, images 163 173 has improved in the  interval. The suspected small extraluminal gas collection adjacent the inflamed sigmoid colon persists but is smaller in the interval. There is focal FDG uptake in this region with a maximum SUV of 15.3. No other abnormal uptake in the soft tissues of the abdomen. Mild uptake adjacent to the left ischium is likely degenerative. No other abnormal uptake in the pelvis. SKELETON: No focal hypermetabolic activity to suggest skeletal metastasis. IMPRESSION: 1. The patient's right upper lobe pulmonary mass is FDG avid consistent with a  primary malignancy. There is FDG avid metastatic disease to a right level 2 cervical lymph node, to right mediastinal nodes, and a right hilar node. 2. The right adrenal mass demonstrates an attenuation of less than 0 Hounsfield units posteriorly and the level of uptake in the right adrenal gland is slightly less than hepatic background suggesting the right adrenal nodule is an adenoma rather than a metastasis. Recommend attention on follow-up. 3. The region of focal sigmoid thickening and adjacent fat stranding with a suspected small extraluminal gas collection likely represents continued sequela of diverticulitis given the recent CT scan. Recommend follow-up to complete resolution of the patient's symptoms and imaging findings. A colonoscopy can of course better evaluate the underlying colon if clinically warranted. Electronically Signed   By: Dorise Bullion III M.D   On: 02/16/2017 07:40   US Venous Img Lower Unilateral Right  Result Date: 02/18/2017 CLINICAL DATA:  Right lower extremity pain with palpable mass. Recent diagnosis of right upper lobe lung mass with abnormal metabolic activity by PET scan and evidence of metastatic disease to right cervical lymph node, mediastinal nodes and a right hilar node. EXAM: RIGHT LOWER EXTREMITY VENOUS DOPPLER ULTRASOUND TECHNIQUE: Gray-scale sonography with graded compression, as well as color Doppler and duplex ultrasound were performed to  evaluate the lower extremity deep venous systems from the level of the common femoral vein and including the common femoral, femoral, profunda femoral, popliteal and calf veins including the posterior tibial, peroneal and gastrocnemius veins when visible. The superficial great saphenous vein was also interrogated. Spectral Doppler was utilized to evaluate flow at rest and with distal augmentation maneuvers in the common femoral, femoral and popliteal veins. COMPARISON:  None. FINDINGS: Contralateral Common Femoral Vein: Respiratory phasicity is normal and symmetric with the symptomatic side. No evidence of thrombus. Normal compressibility. Common Femoral Vein: No evidence of thrombus. Normal compressibility, respiratory phasicity and response to augmentation. Saphenofemoral Junction: No evidence of thrombus. Normal compressibility and flow on color Doppler imaging. Profunda Femoral Vein: No evidence of thrombus. Normal compressibility and flow on color Doppler imaging. Femoral Vein: No evidence of thrombus. Normal compressibility, respiratory phasicity and response to augmentation. Popliteal Vein: No evidence of thrombus. Normal compressibility, respiratory phasicity and response to augmentation. Calf Veins: No evidence of thrombus. Normal compressibility and flow on color Doppler imaging. Superficial Great Saphenous Vein: No evidence of thrombus. Normal compressibility. Venous Reflux:  None. Other Findings: No evidence of superficial thrombophlebitis. There is a discrete abnormal soft tissue mass in the mid thigh measuring up to 7.8 x 3.6 x 5.3 cm. This is concerning for a soft tissue metastasis given the presence of a lung mass and lymph node metastatic disease in the chest and neck by PET scan. IMPRESSION: 1. No evidence of right lower extremity DVT. 2. Soft tissue mass of the right mid thigh measuring nearly 8 cm in greatest diameter and appearing likely malignant by ultrasound. This is concerning for a soft  tissue metastasis from lung carcinoma based on recent PET scan findings. Consider biopsy of this mass versus the right cervical lymph node metastasis for tissue diagnosis. Electronically Signed   By: Aletta Edouard M.D.   On: 02/18/2017 16:35   [place] Core Biopsy (lymph Nodes)  Result Date: 02/22/2017 INDICATION: 66 year old with right lung lesion and concern for metastatic lung cancer. FDG positive lymph node in the right upper neck. EXAM: ULTRASOUND GUIDED FINE NEEDLE ASPIRATION OF RIGHT CERVICAL LYMPH NODE MEDICATIONS: None. ANESTHESIA/SEDATION: Fentanyl 100 mcg IV; Versed 3.0 mg IV Moderate  Sedation Time:  20 minutes The patient was continuously monitored during the procedure by the interventional radiology nurse under my direct supervision. PROCEDURE: The procedure was explained to the patient. The risks and benefits of the procedure were discussed and the patient's questions were addressed. Informed consent was obtained from the patient. Right side of the neck was evaluated with ultrasound. Hypoechoic lymph node posterior to the right parotid gland was identified and targeted. The overlying skin was prepped with chlorhexidine. A sterile field was created. Skin was anesthetized with 1% lidocaine. Using ultrasound guidance, 7 fine-needle aspirations were performed in this hypoechoic lymph node with 25 gauge needles. Bandage placed over the puncture site. COMPLICATIONS: None immediate. FINDINGS: Hypoechoic lymph node in the right upper neck, just posterior to the right parotid gland. No significant bleeding or hematoma formation following the fine-needle aspirations. IMPRESSION: Ultrasound-guided fine-needle aspirations of a right cervical lymph node. Electronically Signed   By: Markus Daft M.D.   On: 02/22/2017 15:17   US Biopsy (abdominal Retropertioneal Mass)  Result Date: 02/22/2017 INDICATION: 66 year old with a right lung lesion and indeterminate right thigh soft tissue lesion. EXAM: ULTRASOUND-GUIDED  CORE BIOPSY OF RIGHT THIGH SOFT TISSUE MASS MEDICATIONS: None. ANESTHESIA/SEDATION: Moderate sedation was given for the right neck biopsy which was immediately prior to this procedure. FLUOROSCOPY TIME:  None COMPLICATIONS: None immediate. PROCEDURE: Informed written consent was obtained from the patient after a thorough discussion of the procedural risks, benefits and alternatives. All questions were addressed. A timeout was performed prior to the initiation of the procedure. Ultrasound demonstrated a hypoechoic mass along the medial right thigh. The overlying skin was prepped with chlorhexidine and sterile field was created. Skin and soft tissues were anesthetized with 1% lidocaine. 17 gauge coaxial needle was directed into the lesion with ultrasound guidance. Total of 4 core biopsies were obtained with 18 gauge core device. Specimens placed in formalin. 17 gauge needle was removed without complication. Bandage placed over the puncture site. FINDINGS: Large oval shaped hypoechoic mass in the medial right thigh. Four core biopsies obtained from the lesion. No significant bleeding or hematoma formation following the core biopsies. IMPRESSION: Successful ultrasound-guided core biopsies of the right thigh mass. Electronically Signed   By: Markus Daft M.D.   On: 02/22/2017 17:15    ASSESSMENT: This is a very pleasant 66 years old white male recently diagnosed with a stage IV (T2a, N3, M1c) non-small cell lung cancer, squamous cell carcinoma presented with right upper lobe lung mass in addition to right hilar and mediastinal adenopathy as well as metastatic disease in the medial right thigh diagnosed in November 2018.   PLAN: I had a lengthy discussion with the patient and his partner today about his current disease of stage, prognosis and treatment options. I personally and independently reviewed the scan images and discussed the results and showed the images to the patient today. I recommended for the patient  to complete the staging workup by ordering an MRI of the brain to rule out metastatic disease to the brain.  I will also order CT scan of the right femur for evaluation of the metastatic disease in the right thigh. I would request the tissue block to be sent for PDL 1 testing. I explained to the patient that he has an incurable condition and all the treatment will be of palliative nature. I would refer the patient to radiation oncology for consideration of palliative radiotherapy to the metastatic mass in the right thigh. I discussed with the patient and  his treatment options including palliative care versus palliative systemic chemotherapy with carboplatin for AC of 5, paclitaxel 175 mg/M2 and Keytruda 200 mg IV every 3 weeks. The patient is interested in proceeding with systemic therapy.  I discussed with him the adverse effect of this treatment including but not limited to alopecia, myelosuppression, nausea and vomiting, peripheral neuropathy, liver or renal dysfunction as well as the immunotherapy adverse effect including skin rash, diarrhea, inflammation of the lung, kidney, liver, thyroid or other endocrine dysfunction including type 1 diabetes mellitus. The patient would like to proceed with the treatment as planned that he is expected to start the first cycle of this treatment on March 12, 2017. I will arrange for the patient to have a chemotherapy education class before starting the first dose of his treatment. I would also call his pharmacy with prescription for Compazine 10 mg p.o. every 6 hours as needed for nausea. He will come back for follow-up visit with the start of the first cycle of his systemic chemotherapy. The patient was advised to call immediately if he has any concerning symptoms in the interval. The patient voices understanding of current disease status and treatment options and is in agreement with the current care plan.  All questions were answered. The patient knows to  call the clinic with any problems, questions or concerns. We can certainly see the patient much sooner if necessary.  Thank you so much for allowing me to participate in the care of Elson Ulbrich. I will continue to follow up the patient with you and assist in his care.  I spent 55 minutes counseling the patient face to face. The total time spent in the appointment was 80 minutes.  Disclaimer: This note was dictated with voice recognition software. Similar sounding words can inadvertently be transcribed and may not be corrected upon review.   Eilleen Kempf February 27, 2017, 12:13 PM

## 2017-02-27 NOTE — Telephone Encounter (Signed)
Scheduled appt per 11/28 los - Gave patient AVS and calender per los.

## 2017-02-27 NOTE — Progress Notes (Signed)
Oncology Nurse Navigator Documentation  Oncology Nurse Navigator Flowsheets 02/27/2017  Navigator Location [facility]  Navigator Encounter Type Other/per Dr. Julien Nordmann, I requested PDL 1 testing on recent biopsy.   Treatment Phase Pre-Tx/Tx Discussion  Barriers/Navigation Needs Coordination of Care  Interventions Coordination of Care  Coordination of Care Other  Acuity Level 2  Time Spent with Patient 15

## 2017-02-27 NOTE — Progress Notes (Signed)
START ON PATHWAY REGIMEN - Non-Small Cell Lung     A cycle is every 21 days:     Paclitaxel      Carboplatin   **Always confirm dose/schedule in your pharmacy ordering system**    Patient Characteristics: Stage IV Metastatic, Squamous, PS = 0, 1, First Line, PD-L1 Expression Positive 1-49% (TPS) / Negative / Not Tested / Not a Candidate for Immunotherapy/Awaiting Test Results AJCC T Category: T2a Current Disease Status: Distant Metastases AJCC N Category: N3 AJCC M Category: M1c AJCC 8 Stage Grouping: IVB Histology: Squamous Cell Line of therapy: First Line PD-L1 Expression Status: Awaiting Test Results Performance Status: PS = 0, 1 Would you be surprised if this patient died  in the next year<= I would NOT be surprised if this patient died in the next year Intent of Therapy: Non-Curative / Palliative Intent, Discussed with Patient 

## 2017-03-01 NOTE — Progress Notes (Signed)
Location of Tumor / Histology:[facility]    Right thigh mass metastatic  ( Right upper mass Satge IV non-small cell cancer)(squamous cell0  Patient presented  months ago with symptoms of:   Biopsies of  (if applicable) revealed: Diagnosis 02/22/2017: Soft tissue mass, biopsy, Right Thigh METASTATIC CARCINOMA, FAVOR SQUAMOUS CELL CARCINOMA OF THE LUNG  Tobacco/Marijuana/Snuff/ETOH use:   Past/Anticipated interventions by cardiothoracic surgery, if any:   Past/Anticipated interventions by medical oncology, if any:Dr. Julien Nordmann appt 02/27/17: Referral for palliative radiatio to  Right thigh Chemo education 03/05/17,   (1st cycle chemotherapy 03/12/17,Carboplatin for [facility] of 5,paclitaxel and Keytruda every 3 weeks )  Signs/Symptoms  Weight changes, if any:   Respiratory complaints, if any:  Hemoptysis, if any:   Pain issues, if any:    SAFETY ISSUES:  Prior radiation   Pacemaker/ICD?    Is the patient on methotrexate?   Current Complaints / other details:  COPD,

## 2017-03-01 NOTE — Progress Notes (Signed)
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 Julien Nordmann appt 02/27/17: Referral for palliative radiatio to  Right thigh Chemo education 03/05/17,   (1st cycle chemotherapy 03/12/17,Carboplatin for [facility] of 5,paclitaxel and Keytruda every 3 weeks )  Signs/Symptoms  Weight changes, if any:  no  Respiratory complaints, if any: SOB, fatigue   Hemoptysis, if any: no Pain issues, if any:  Right thigh  SAFETY ISSUES:  Prior radiation  No  Pacemaker/ICD?  NO  Is the patient on methotrexate?  No  Current Complaints / other details:  COPD,BPH,HTN,osteoarthritis,A-Fib, Chronic kidney disease,1ppd cigarettes x40 years,no alcohol or drug use,  Former user of snuff,   Mother HTN, heart disease, ,Father stroke and leukemia, cancer, Sister and brother with lung cancer,  Allergies: Ciprofloxacin-hives,Codeine-"jitters,skin crawling',Adhesive tape,skin peels BP 134/85   Pulse 61   Temp 98.1 F (36.7 C) (Oral)   Resp 20   Ht 6\' 7"  (2.007 m)   Wt 290 lb (131.5 kg)   BMI 32.67 kg/m   Wt Readings from Last 3 Encounters:  03/05/17 290 lb (131.5 kg)  02/27/17 289 lb 6.4 oz (131.3 kg)  02/22/17 285 lb (129.3 kg)

## 2017-03-05 ENCOUNTER — Encounter: Payer: Self-pay | Admitting: *Deleted

## 2017-03-05 ENCOUNTER — Ambulatory Visit
Admission: RE | Admit: 2017-03-05 | Discharge: 2017-03-05 | Disposition: A | Discharge status: Home / Self Care | Payer: Managed Care, Other (non HMO) | Source: Ambulatory Visit | Attending: Radiation Oncology | Admitting: Radiation Oncology

## 2017-03-05 ENCOUNTER — Other Ambulatory Visit: Payer: Managed Care, Other (non HMO)

## 2017-03-05 ENCOUNTER — Ambulatory Visit: Payer: Managed Care, Other (non HMO)

## 2017-03-05 VITALS — BP 134/85 | HR 61 | Temp 98.1°F | Resp 20 | Ht 79.0 in | Wt 290.0 lb

## 2017-03-05 DIAGNOSIS — M1990 Unspecified osteoarthritis, unspecified site: Secondary | ICD-10-CM | POA: Insufficient documentation

## 2017-03-05 DIAGNOSIS — Z8249 Family history of ischemic heart disease and other diseases of the circulatory system: Secondary | ICD-10-CM | POA: Insufficient documentation

## 2017-03-05 DIAGNOSIS — Z801 Family history of malignant neoplasm of trachea, bronchus and lung: Secondary | ICD-10-CM | POA: Insufficient documentation

## 2017-03-05 DIAGNOSIS — Z87891 Personal history of nicotine dependence: Secondary | ICD-10-CM | POA: Insufficient documentation

## 2017-03-05 DIAGNOSIS — Z9889 Other specified postprocedural states: Secondary | ICD-10-CM | POA: Diagnosis not present

## 2017-03-05 DIAGNOSIS — Z87442 Personal history of urinary calculi: Secondary | ICD-10-CM | POA: Diagnosis not present

## 2017-03-05 DIAGNOSIS — C7951 Secondary malignant neoplasm of bone: Secondary | ICD-10-CM

## 2017-03-05 DIAGNOSIS — Z79899 Other long term (current) drug therapy: Secondary | ICD-10-CM | POA: Diagnosis not present

## 2017-03-05 DIAGNOSIS — Z823 Family history of stroke: Secondary | ICD-10-CM | POA: Diagnosis not present

## 2017-03-05 DIAGNOSIS — Z510 Encounter for antineoplastic radiation therapy: Secondary | ICD-10-CM | POA: Diagnosis not present

## 2017-03-05 DIAGNOSIS — Z791 Long term (current) use of non-steroidal anti-inflammatories (NSAID): Secondary | ICD-10-CM | POA: Insufficient documentation

## 2017-03-05 DIAGNOSIS — C3490 Malignant neoplasm of unspecified part of unspecified bronchus or lung: Secondary | ICD-10-CM

## 2017-03-05 DIAGNOSIS — N401 Enlarged prostate with lower urinary tract symptoms: Secondary | ICD-10-CM | POA: Insufficient documentation

## 2017-03-05 DIAGNOSIS — Z885 Allergy status to narcotic agent status: Secondary | ICD-10-CM | POA: Insufficient documentation

## 2017-03-05 DIAGNOSIS — J449 Chronic obstructive pulmonary disease, unspecified: Secondary | ICD-10-CM | POA: Diagnosis not present

## 2017-03-05 DIAGNOSIS — I129 Hypertensive chronic kidney disease with stage 1 through stage 4 chronic kidney disease, or unspecified chronic kidney disease: Secondary | ICD-10-CM | POA: Insufficient documentation

## 2017-03-05 DIAGNOSIS — C7952 Secondary malignant neoplasm of bone marrow: Principal | ICD-10-CM

## 2017-03-05 DIAGNOSIS — C3411 Malignant neoplasm of upper lobe, right bronchus or lung: Secondary | ICD-10-CM | POA: Diagnosis not present

## 2017-03-05 DIAGNOSIS — N189 Chronic kidney disease, unspecified: Secondary | ICD-10-CM | POA: Insufficient documentation

## 2017-03-05 DIAGNOSIS — C7989 Secondary malignant neoplasm of other specified sites: Secondary | ICD-10-CM | POA: Insufficient documentation

## 2017-03-05 DIAGNOSIS — R338 Other retention of urine: Secondary | ICD-10-CM | POA: Diagnosis not present

## 2017-03-05 DIAGNOSIS — Z96652 Presence of left artificial knee joint: Secondary | ICD-10-CM | POA: Insufficient documentation

## 2017-03-05 DIAGNOSIS — Z881 Allergy status to other antibiotic agents status: Secondary | ICD-10-CM | POA: Diagnosis not present

## 2017-03-05 DIAGNOSIS — Z91048 Other nonmedicinal substance allergy status: Secondary | ICD-10-CM | POA: Insufficient documentation

## 2017-03-05 NOTE — Progress Notes (Signed)
Radiation Oncology         [phone]) [phone] ________________________________  Name: Aaron Sellers        MRN: [id number]  Date of Service: 03/05/2017 DOB: September 13, 1950  CC:Sheryle Hail, PA-C  Curt Bears, MD     REFERRING PHYSICIAN: Curt Bears, MD   DIAGNOSIS: The encounter diagnosis was Stage IV squamous cell carcinoma of right lung ([place]).   HISTORY OF PRESENT ILLNESS: Aaron Sellers is a 66 y.o. male seen at the request of Dr. Julien Nordmann for a history of stage IV lung cancer. On 01/27/17 patient had a CT Chest with contrast revealing, right upper lobe lung mass, consistent with primary bronchogenic carcinoma. Mediastinal and right hilar adenopathy, consistent with nodal metastasis, right adrenal nodularity which is indeterminate. On 02/15/17 patient had a PET scan that revealed a hypermetabolic right upper lobe mass, and hypermetabolic change to a right level 2 cervical lymph node, to right mediastinal nodes, and a right hilar node. The right adrenal mass demonstrated to be adenoma rather than metastasis and the region of focal sigmoid thickening represented to be continued sequela of diverticulitis. He also had a palpable mass in the right lower extremity, and he underwent an ultrasound of the right lower extremity revealing no evidence of  DVT, but confirmed a soft tissue mass of the right mid thigh measuring 7.8 x 3.6 x 5.3 cm cm. On 02/22/17 a [place] core biopsy of right cervical lymph node was performed revealing no malignancy. A biopsy of the right lower extremity mass was also biopsied and was consistent with metastatic squamous cell carcinoma consistent with lung primary. He is planning to get started on chemotherapy on Tuesday next week, and comes today to discuss options of radiotherapy to the right thigh.     PREVIOUS RADIATION THERAPY: No   PAST MEDICAL HISTORY:  Past Medical History:  Diagnosis Date  . Arthritis   . BPH (benign prostatic hypertrophy) with urinary retention     . Chronic kidney disease    hx of kidney stones  2011  . COPD (chronic obstructive pulmonary disease) ([place])    has been smoking since he was 22 ish--  . High cholesterol   . Hypertension    dx around 2011       PAST SURGICAL HISTORY: Past Surgical History:  Procedure Laterality Date  . HERNIA REPAIR     umbilical hernia  08/4401  . INCISION AND DRAINAGE ABSCESS     "down the middle" of his buttocks  2015  . TOTAL KNEE ARTHROPLASTY Left 10/29/2014   Procedure: TOTAL KNEE ARTHROPLASTY;  Surgeon: Dorna Leitz, MD;  Location: [facility];  Service: Orthopedics;  Laterality: Left;     FAMILY HISTORY:  Family History  Problem Relation Age of Onset  . Hypertension Mother   . CVA Father   . Lung cancer Sister   . Lung cancer Brother   . Hypertension Brother   . Hypertension Sister      SOCIAL HISTORY:  reports that he quit smoking about 5 weeks ago. His smoking use included cigarettes. He has a 40.00 pack-year smoking history. He has quit using smokeless tobacco. His smokeless tobacco use included snuff. He reports that he does not drink alcohol or use drugs. He is single but in a relationship with his partner [place] They live in [place], [place].   ALLERGIES: Ciprofloxacin; Adhesive [tape]; and Codeine   MEDICATIONS:  Current Outpatient Medications  Medication Sig Dispense Refill  . atorvastatin (LIPITOR) 10 MG tablet Take 10 mg  by mouth daily.    Marland Kitchen diltiazem (CARDIZEM CD) 240 MG 24 hr capsule Take 1 capsule (240 mg total) by mouth daily. 30 capsule 2  . diphenhydrAMINE (BENADRYL) 25 MG tablet Take 25 mg every 6 (six) hours as needed by mouth.    . famotidine (PEPCID) 10 MG tablet Take 10 mg by mouth as needed for heartburn or indigestion.    Marland Kitchen ibuprofen (ADVIL,MOTRIN) 200 MG tablet Take 600-800 mg by mouth every 6 (six) hours as needed for mild pain.     . tamsulosin (FLOMAX) 0.4 MG CAPS capsule Take 0.4 mg by mouth daily.    . hydrocortisone 2.5 % cream Apply 1 application  topically 2 (two) times daily as needed (skin irritations).     . prochlorperazine (COMPAZINE) 10 MG tablet Take 1 tablet (10 mg total) by mouth every 6 (six) hours as needed for nausea or vomiting. (Patient not taking: Reported on 03/05/2017) 30 tablet 0   No current facility-administered medications for this encounter.      REVIEW OF SYSTEMS: On review of systems, the patient reports that he is doing well overall. He discusses that when he walks for a long period of time, that his right thigh starts to feel uncomfortable. 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 He denies any chest pain, shortness of breath, cough, fevers, chills, night sweats, unintended weight changes. He denies any bowel or bladder disturbances, and denies abdominal pain, nausea or vomiting. He expressed having musculoskeletal or joint aches or pains. A complete review of systems is obtained and is otherwise negative.     PHYSICAL EXAM:  Wt Readings from Last 3 Encounters:  03/05/17 290 lb (131.5 kg)  02/27/17 289 lb 6.4 oz (131.3 kg)  02/22/17 285 lb (129.3 kg)   Temp Readings from Last 3 Encounters:  03/05/17 98.1 F (36.7 C) (Oral)  02/27/17 97.9 F (36.6 C) (Oral)  02/22/17 98 F (36.7 C) (Oral)   BP Readings from Last 3 Encounters:  03/05/17 134/85  02/27/17 131/79  02/22/17 127/88   Pulse Readings from Last 3 Encounters:  03/05/17 61  02/27/17 (!) 54  02/22/17 62   Pain Assessment Pain Score: 2  Pain Loc: Hip(rfight thigh)/10  In general this is a well appearing he in no acute distress. he is alert and oriented x4 and appropriate throughout the examination. HEENT reveals that the patient is normocephalic, atraumatic. EOMs are intact. PERRLA. Skin is intact without any evidence of gross lesions. Cardiovascular exam reveals a regular rate and rhythm, no clicks rubs or murmurs are auscultated. Chest is clear to  auscultation bilaterally. Lymphatic assessment is performed and does not reveal any adenopathy in the cervical, supraclavicular, axillary, or inguinal chains. Abdomen has active bowel sounds in all quadrants and is intact. The abdomen is soft, non tender, non distended. Lower extremities are negative for pretibial pitting edema, deep calf tenderness, cyanosis or clubbing. Mass on right thigh is about 6 cm by 4 cm.    ECOG = 1  0 - Asymptomatic (Fully active, able to carry on all predisease activities without restriction)  1 - Symptomatic but completely ambulatory (Restricted in physically strenuous activity but ambulatory and able to carry out work of a light or sedentary nature. For example, light housework, office work)  2 - Symptomatic, <50% in bed during the day (Ambulatory and capable of all self care but unable to carry  out any work activities. Up and about more than 50% of waking hours)  3 - Symptomatic, >50% in bed, but not bedbound (Capable of only limited self-care, confined to bed or chair 50% or more of waking hours)  4 - Bedbound (Completely disabled. Cannot carry on any self-care. Totally confined to bed or chair)  5 - Death   Eustace Pen MM, Creech RH, Tormey DC, et al. [phone]). "Toxicity and response criteria of the [facility]". Geiger Oncol. 5 (6): 649-55    LABORATORY DATA:  Lab Results  Component Value Date   WBC 9.5 02/27/2017   HGB 14.2 02/27/2017   HCT 43.4 02/27/2017   MCV 85.3 02/27/2017   PLT 232 02/27/2017   Lab Results  Component Value Date   NA 138 02/27/2017   K 4.3 02/27/2017   CL 102 01/30/2017   CO2 27 02/27/2017   Lab Results  Component Value Date   ALT 18 02/27/2017   AST 14 02/27/2017   ALKPHOS 79 02/27/2017   BILITOT 0.57 02/27/2017      RADIOGRAPHY: Nm Pet Image Initial (pi) Skull Base To Thigh  Result Date: 02/16/2017 CLINICAL DATA:  Evaluate lung mass. EXAM: NUCLEAR MEDICINE PET SKULL BASE TO THIGH  TECHNIQUE: 14.0 mCi F-18 FDG was injected intravenously. Full-ring PET imaging was performed from the skull base to thigh after the radiotracer. CT data was obtained and used for attenuation correction and anatomic localization. FASTING BLOOD GLUCOSE:  Value: 98 mg/dl COMPARISON:  CT scan of the chest January 27, 2017. CT scan of the abdomen and pelvis January 26, 2017. FINDINGS: NECK: There is increased uptake in a lymph node just deep to the right submandibular gland on series 4, image 23. The lymph node measures 10 mm in short axis with a maximum SUV of 11.3. Uptake in a left cervical facet on image 29 is consistent with degenerative change. Mild uptake in the left parotid gland on image 20 is likely either a node within the parotid or benign lesion with low level uptake and a maximum SUV of 3.8. No other suspicious uptake in the head or neck regions. CHEST: The mass in the right upper lobe measures 3.2 by 2.6 cm on series 8, image 23, not changed in the interval with a maximum SUV of 13.5, consistent with malignancy. FDG avid metastatic nodes are seen in the right paratracheal region on image 62, to the right of the carina on image 69, and in the right hilum on image 76. The right hilar node measures 3 cm with a maximum SUV of 30. No other FDG avid disease in the chest. ABDOMEN/PELVIS: The patient has known right adrenal nodule is again identified measuring up to 3.2 cm, unchanged. On precontrast images, the posterior aspect of the nodule demonstrates an attenuation of less than 0 Hounsfield units. There is low level uptake in the anteromedial aspect of the right adrenal gland on image 118 with a maximum SUV of 3.7 compare 2 background hepatic activity of 4.0 as measured on image 110. The majority of the right adrenal gland demonstrates no uptake. The focal wall thickening and surrounding fat stranding associated with the sigmoid colon seen on series 4, images 163 173 has improved in the interval. The suspected  small extraluminal gas collection adjacent the inflamed sigmoid colon persists but is smaller in the interval. There is focal FDG uptake in this region with a maximum SUV of 15.3. No other abnormal uptake in the soft tissues of the  abdomen. Mild uptake adjacent to the left ischium is likely degenerative. No other abnormal uptake in the pelvis. SKELETON: No focal hypermetabolic activity to suggest skeletal metastasis. IMPRESSION: 1. The patient's right upper lobe pulmonary mass is FDG avid consistent with a primary malignancy. There is FDG avid metastatic disease to a right level 2 cervical lymph node, to right mediastinal nodes, and a right hilar node. 2. The right adrenal mass demonstrates an attenuation of less than 0 Hounsfield units posteriorly and the level of uptake in the right adrenal gland is slightly less than hepatic background suggesting the right adrenal nodule is an adenoma rather than a metastasis. Recommend attention on follow-up. 3. The region of focal sigmoid thickening and adjacent fat stranding with a suspected small extraluminal gas collection likely represents continued sequela of diverticulitis given the recent CT scan. Recommend follow-up to complete resolution of the patient's symptoms and imaging findings. A colonoscopy can of course better evaluate the underlying colon if clinically warranted. Electronically Signed   By: Dorise Bullion III M.D   On: 02/16/2017 07:40   US Venous Img Lower Unilateral Right  Result Date: 02/18/2017 CLINICAL DATA:  Right lower extremity pain with palpable mass. Recent diagnosis of right upper lobe lung mass with abnormal metabolic activity by PET scan and evidence of metastatic disease to right cervical lymph node, mediastinal nodes and a right hilar node. EXAM: RIGHT LOWER EXTREMITY VENOUS DOPPLER ULTRASOUND TECHNIQUE: Gray-scale sonography with graded compression, as well as color Doppler and duplex ultrasound were performed to evaluate the lower  extremity deep venous systems from the level of the common femoral vein and including the common femoral, femoral, profunda femoral, popliteal and calf veins including the posterior tibial, peroneal and gastrocnemius veins when visible. The superficial great saphenous vein was also interrogated. Spectral Doppler was utilized to evaluate flow at rest and with distal augmentation maneuvers in the common femoral, femoral and popliteal veins. COMPARISON:  None. FINDINGS: Contralateral Common Femoral Vein: Respiratory phasicity is normal and symmetric with the symptomatic side. No evidence of thrombus. Normal compressibility. Common Femoral Vein: No evidence of thrombus. Normal compressibility, respiratory phasicity and response to augmentation. Saphenofemoral Junction: No evidence of thrombus. Normal compressibility and flow on color Doppler imaging. Profunda Femoral Vein: No evidence of thrombus. Normal compressibility and flow on color Doppler imaging. Femoral Vein: No evidence of thrombus. Normal compressibility, respiratory phasicity and response to augmentation. Popliteal Vein: No evidence of thrombus. Normal compressibility, respiratory phasicity and response to augmentation. Calf Veins: No evidence of thrombus. Normal compressibility and flow on color Doppler imaging. Superficial Great Saphenous Vein: No evidence of thrombus. Normal compressibility. Venous Reflux:  None. Other Findings: No evidence of superficial thrombophlebitis. There is a discrete abnormal soft tissue mass in the mid thigh measuring up to 7.8 x 3.6 x 5.3 cm. This is concerning for a soft tissue metastasis given the presence of a lung mass and lymph node metastatic disease in the chest and neck by PET scan. IMPRESSION: 1. No evidence of right lower extremity DVT. 2. Soft tissue mass of the right mid thigh measuring nearly 8 cm in greatest diameter and appearing likely malignant by ultrasound. This is concerning for a soft tissue metastasis from  lung carcinoma based on recent PET scan findings. Consider biopsy of this mass versus the right cervical lymph node metastasis for tissue diagnosis. Electronically Signed   By: Aletta Edouard M.D.   On: 02/18/2017 16:35   [place] Core Biopsy (lymph Nodes)  Result Date: 02/22/2017 INDICATION: 66 year old  with right lung lesion and concern for metastatic lung cancer. FDG positive lymph node in the right upper neck. EXAM: ULTRASOUND GUIDED FINE NEEDLE ASPIRATION OF RIGHT CERVICAL LYMPH NODE MEDICATIONS: None. ANESTHESIA/SEDATION: Fentanyl 100 mcg IV; Versed 3.0 mg IV Moderate Sedation Time:  20 minutes The patient was continuously monitored during the procedure by the interventional radiology nurse under my direct supervision. PROCEDURE: The procedure was explained to the patient. The risks and benefits of the procedure were discussed and the patient's questions were addressed. Informed consent was obtained from the patient. Right side of the neck was evaluated with ultrasound. Hypoechoic lymph node posterior to the right parotid gland was identified and targeted. The overlying skin was prepped with chlorhexidine. A sterile field was created. Skin was anesthetized with 1% lidocaine. Using ultrasound guidance, 7 fine-needle aspirations were performed in this hypoechoic lymph node with 25 gauge needles. Bandage placed over the puncture site. COMPLICATIONS: None immediate. FINDINGS: Hypoechoic lymph node in the right upper neck, just posterior to the right parotid gland. No significant bleeding or hematoma formation following the fine-needle aspirations. IMPRESSION: Ultrasound-guided fine-needle aspirations of a right cervical lymph node. Electronically Signed   By: Markus Daft M.D.   On: 02/22/2017 15:17   US Biopsy (abdominal Retropertioneal Mass)  Result Date: 02/22/2017 INDICATION: 66 year old with a right lung lesion and indeterminate right thigh soft tissue lesion. EXAM: ULTRASOUND-GUIDED CORE BIOPSY OF RIGHT  THIGH SOFT TISSUE MASS MEDICATIONS: None. ANESTHESIA/SEDATION: Moderate sedation was given for the right neck biopsy which was immediately prior to this procedure. FLUOROSCOPY TIME:  None COMPLICATIONS: None immediate. PROCEDURE: Informed written consent was obtained from the patient after a thorough discussion of the procedural risks, benefits and alternatives. All questions were addressed. A timeout was performed prior to the initiation of the procedure. Ultrasound demonstrated a hypoechoic mass along the medial right thigh. The overlying skin was prepped with chlorhexidine and sterile field was created. Skin and soft tissues were anesthetized with 1% lidocaine. 17 gauge coaxial needle was directed into the lesion with ultrasound guidance. Total of 4 core biopsies were obtained with 18 gauge core device. Specimens placed in formalin. 17 gauge needle was removed without complication. Bandage placed over the puncture site. FINDINGS: Large oval shaped hypoechoic mass in the medial right thigh. Four core biopsies obtained from the lesion. No significant bleeding or hematoma formation following the core biopsies. IMPRESSION: Successful ultrasound-guided core biopsies of the right thigh mass. Electronically Signed   By: Markus Daft M.D.   On: 02/22/2017 17:15       IMPRESSION/PLAN: 1. Stage IV NSCLC, squamous cell carcinoma of the right upper lobe with metastatic disease to the soft tissue of the right thigh. Dr. Lisbeth Renshaw discusses the pathology findings and reviews the nature of metastatic lung cancer. We agree with his MRI of the brain, and to proceed with diagnostic CT of the right thigh this Thursday. Dr. Lisbeth Renshaw reviews the rationale for raditoherapy to the thigh, and discusses that following chemotherapy if there was persistent disease in the chest, we could consider palliative radiotherapy. We discussed the risks, benefits, short, and long term effects of radiotherapy, and the patient is interested in proceeding.  Dr. Lisbeth Renshaw discusses the delivery and logistics of radiotherapy and anticipates a course of 3 weeks as he will be receiving concurrent chemotherapy. We will plan for simulation on Thursday this week. Written consent is obtained and placed in the chart, a copy was provided to the patient.   The above documentation reflects my direct findings  during this shared patient visit. Please see the separate note by Dr. Lisbeth Renshaw on this date for the remainder of the patient's plan of care.     Carola Rhine, PAC  This document serves as a record of services personally performed by Kyung Rudd MD and Shona Simpson, PA-C. It was created on their behalf by Delton Coombes, a trained medical scribe. The creation of this record is based on the scribe's personal observations and the provider's statements to them.

## 2017-03-05 NOTE — Addendum Note (Signed)
Encounter addended by: Doreen Beam, RN on: 03/05/2017 12:04 PM  Actions taken: Charge Capture section accepted

## 2017-03-05 NOTE — Addendum Note (Signed)
Encounter addended by: Doreen Beam, RN on: 03/05/2017 12:09 PM  Actions taken: Charge Capture section accepted, Visit diagnoses modified

## 2017-03-05 NOTE — Progress Notes (Signed)
Please see the Nurse Progress Note in the MD Initial Consult Encounter for this patient. 

## 2017-03-06 ENCOUNTER — Ambulatory Visit: Payer: Managed Care, Other (non HMO) | Admitting: Radiation Oncology

## 2017-03-07 ENCOUNTER — Ambulatory Visit
Admission: RE | Admit: 2017-03-07 | Discharge: 2017-03-07 | Disposition: A | Discharge status: Home / Self Care | Payer: Managed Care, Other (non HMO) | Source: Ambulatory Visit | Attending: Radiation Oncology | Admitting: Radiation Oncology

## 2017-03-07 ENCOUNTER — Telehealth: Payer: Self-pay | Admitting: Medical Oncology

## 2017-03-07 ENCOUNTER — Ambulatory Visit (HOSPITAL_COMMUNITY): Admission: RE | Admit: 2017-03-07 | Payer: Managed Care, Other (non HMO) | Source: Ambulatory Visit

## 2017-03-07 ENCOUNTER — Ambulatory Visit (HOSPITAL_COMMUNITY): Payer: Managed Care, Other (non HMO)

## 2017-03-07 ENCOUNTER — Encounter (HOSPITAL_COMMUNITY): Payer: Self-pay

## 2017-03-07 DIAGNOSIS — Z510 Encounter for antineoplastic radiation therapy: Secondary | ICD-10-CM | POA: Diagnosis not present

## 2017-03-07 DIAGNOSIS — C7989 Secondary malignant neoplasm of other specified sites: Secondary | ICD-10-CM

## 2017-03-07 NOTE — Telephone Encounter (Signed)
Questions about insurance auth for scans and ct sim. Does pt need to keep appt. I told wife for pt to keep appt today with Dr Lisbeth Renshaw . I sent message to care management for Scan auth

## 2017-03-08 ENCOUNTER — Ambulatory Visit (HOSPITAL_COMMUNITY)
Admission: RE | Admit: 2017-03-08 | Discharge: 2017-03-08 | Disposition: A | Discharge status: Home / Self Care | Payer: Managed Care, Other (non HMO) | Source: Ambulatory Visit | Attending: Internal Medicine | Admitting: Internal Medicine

## 2017-03-08 DIAGNOSIS — C7989 Secondary malignant neoplasm of other specified sites: Secondary | ICD-10-CM | POA: Diagnosis not present

## 2017-03-08 DIAGNOSIS — C3491 Malignant neoplasm of unspecified part of right bronchus or lung: Secondary | ICD-10-CM

## 2017-03-08 MED ORDER — GADOBENATE DIMEGLUMINE 529 MG/ML IV SOLN
20.0000 mL | Freq: Once | INTRAVENOUS | Status: AC | PRN
  Administered 2017-03-08: 20 mL via INTRAVENOUS

## 2017-03-08 MED ORDER — GADOBENATE DIMEGLUMINE 529 MG/ML IV SOLN: 20.0000 mL | Freq: Once | INTRAVENOUS | Status: DC | PRN

## 2017-03-08 MED ORDER — IOPAMIDOL (ISOVUE-300) INJECTION 61%
INTRAVENOUS | Status: AC
  Administered 2017-03-08: 75 mL via INTRAVENOUS
  Filled 2017-03-08: qty 100

## 2017-03-12 ENCOUNTER — Ambulatory Visit (HOSPITAL_BASED_OUTPATIENT_CLINIC_OR_DEPARTMENT_OTHER): Payer: Managed Care, Other (non HMO)

## 2017-03-12 ENCOUNTER — Ambulatory Visit: Payer: Managed Care, Other (non HMO) | Admitting: Medical

## 2017-03-12 ENCOUNTER — Encounter: Payer: Self-pay | Admitting: Oncology

## 2017-03-12 ENCOUNTER — Other Ambulatory Visit (HOSPITAL_BASED_OUTPATIENT_CLINIC_OR_DEPARTMENT_OTHER): Payer: Managed Care, Other (non HMO)

## 2017-03-12 ENCOUNTER — Ambulatory Visit (HOSPITAL_BASED_OUTPATIENT_CLINIC_OR_DEPARTMENT_OTHER): Payer: Managed Care, Other (non HMO) | Admitting: Oncology

## 2017-03-12 ENCOUNTER — Encounter: Payer: Self-pay | Admitting: *Deleted

## 2017-03-12 VITALS — BP 125/63 | HR 66 | Temp 97.8°F | Resp 18

## 2017-03-12 VITALS — BP 138/72 | HR 64 | Temp 98.6°F | Resp 19 | Ht 79.0 in | Wt 292.6 lb

## 2017-03-12 DIAGNOSIS — N189 Chronic kidney disease, unspecified: Secondary | ICD-10-CM | POA: Diagnosis not present

## 2017-03-12 DIAGNOSIS — C7989 Secondary malignant neoplasm of other specified sites: Secondary | ICD-10-CM | POA: Diagnosis not present

## 2017-03-12 DIAGNOSIS — C3411 Malignant neoplasm of upper lobe, right bronchus or lung: Secondary | ICD-10-CM

## 2017-03-12 DIAGNOSIS — Z5111 Encounter for antineoplastic chemotherapy: Secondary | ICD-10-CM | POA: Diagnosis not present

## 2017-03-12 DIAGNOSIS — C3491 Malignant neoplasm of unspecified part of right bronchus or lung: Secondary | ICD-10-CM

## 2017-03-12 DIAGNOSIS — Z5112 Encounter for antineoplastic immunotherapy: Secondary | ICD-10-CM | POA: Diagnosis not present

## 2017-03-12 DIAGNOSIS — E119 Type 2 diabetes mellitus without complications: Secondary | ICD-10-CM

## 2017-03-12 DIAGNOSIS — R5382 Chronic fatigue, unspecified: Secondary | ICD-10-CM | POA: Diagnosis not present

## 2017-03-12 DIAGNOSIS — R0602 Shortness of breath: Secondary | ICD-10-CM | POA: Diagnosis not present

## 2017-03-12 DIAGNOSIS — T8090XA Unspecified complication following infusion and therapeutic injection, initial encounter: Secondary | ICD-10-CM

## 2017-03-12 DIAGNOSIS — Z510 Encounter for antineoplastic radiation therapy: Secondary | ICD-10-CM | POA: Diagnosis not present

## 2017-03-12 LAB — COMPREHENSIVE METABOLIC PANEL
ALT: 19 U/L (ref 0–55)
AST: 15 U/L (ref 5–34)
Albumin: 3.4 g/dL — ABNORMAL LOW (ref 3.5–5.0)
Alkaline Phosphatase: 91 U/L (ref 40–150)
Anion Gap: 10 mEq/L (ref 3–11)
BILIRUBIN TOTAL: 0.69 mg/dL (ref 0.20–1.20)
BUN: 12 mg/dL (ref 7.0–26.0)
CO2: 24 meq/L (ref 22–29)
CREATININE: 0.8 mg/dL (ref 0.7–1.3)
Calcium: 8.9 mg/dL (ref 8.4–10.4)
Chloride: 104 mEq/L (ref 98–109)
EGFR: >60 mL/min/{1.73_m2} (ref >60)
GLUCOSE: 160 mg/dL — AB (ref 70–140)
Potassium: 3.7 mEq/L (ref 3.5–5.1)
SODIUM: 139 meq/L (ref 136–145)
TOTAL PROTEIN: 6.6 g/dL (ref 6.4–8.3)

## 2017-03-12 LAB — CBC WITH DIFFERENTIAL/PLATELET
BASO%: 0.7 % (ref 0.0–2.0)
BASOS ABS: 0.1 10*3/uL (ref 0.0–0.1)
EOS%: 3.5 % (ref 0.0–7.0)
Eosinophils Absolute: 0.3 10*3/uL (ref 0.0–0.5)
HCT: 42.6 % (ref 38.4–49.9)
HEMOGLOBIN: 14.2 g/dL (ref 13.0–17.1)
LYMPH%: 24.3 % (ref 14.0–49.0)
MCH: 28.3 pg (ref 27.2–33.4)
MCHC: 33.3 g/dL (ref 32.0–36.0)
MCV: 85 fL (ref 79.3–98.0)
MONO#: 0.8 10*3/uL (ref 0.1–0.9)
MONO%: 7.8 % (ref 0.0–14.0)
NEUT#: 6.3 10*3/uL (ref 1.5–6.5)
NEUT%: 63.7 % (ref 39.0–75.0)
PLATELETS: 209 10*3/uL (ref 140–400)
RBC: 5.01 10*6/uL (ref 4.20–5.82)
RDW: 13.3 % (ref 11.0–14.6)
WBC: 9.8 10*3/uL (ref 4.0–10.3)
lymph#: 2.4 10*3/uL (ref 0.9–3.3)
nRBC: 0 % (ref 0–0)

## 2017-03-12 LAB — TSH: TSH: 2.787 m[IU]/L (ref 0.320–4.118)

## 2017-03-12 MED ORDER — SODIUM CHLORIDE 0.9 % IV SOLN
20.0000 mg | Freq: Once | INTRAVENOUS | Status: AC
  Administered 2017-03-12: 20 mg via INTRAVENOUS
  Filled 2017-03-12: qty 2

## 2017-03-12 MED ORDER — METHYLPREDNISOLONE SODIUM SUCC 125 MG IJ SOLR
125.0000 mg | Freq: Once | INTRAMUSCULAR | Status: AC | PRN
  Administered 2017-03-12: 125 mg via INTRAVENOUS

## 2017-03-12 MED ORDER — SODIUM CHLORIDE 0.9% FLUSH
10.0000 mL | INTRAVENOUS | Status: DC | PRN
  Filled 2017-03-12: qty 10

## 2017-03-12 MED ORDER — SODIUM CHLORIDE 0.9 % IV SOLN: Freq: Once | INTRAVENOUS | Status: DC

## 2017-03-12 MED ORDER — FAMOTIDINE IN NACL 20-0.9 MG/50ML-% IV SOLN
INTRAVENOUS | Status: AC
Start: 2017-03-12 — End: 2017-03-12
  Filled 2017-03-12: qty 50

## 2017-03-12 MED ORDER — SODIUM CHLORIDE 0.9 % IV SOLN
Freq: Once | INTRAVENOUS | Status: AC | PRN
  Administered 2017-03-12: 15:00:00 via INTRAVENOUS

## 2017-03-12 MED ORDER — HEPARIN SOD (PORK) LOCK FLUSH 100 UNIT/ML IV SOLN
500.0000 [IU] | Freq: Once | INTRAVENOUS | Status: AC | PRN
  Filled 2017-03-12: qty 5

## 2017-03-12 MED ORDER — DIPHENHYDRAMINE HCL 50 MG/ML IJ SOLN
INTRAMUSCULAR | Status: AC
  Filled 2017-03-12: qty 1

## 2017-03-12 MED ORDER — FAMOTIDINE IN NACL 20-0.9 MG/50ML-% IV SOLN
20.0000 mg | Freq: Once | INTRAVENOUS | Status: AC
  Administered 2017-03-12: 20 mg via INTRAVENOUS

## 2017-03-12 MED ORDER — SODIUM CHLORIDE 0.9% FLUSH
10.0000 mL | INTRAVENOUS | Status: AC | PRN
  Filled 2017-03-12: qty 10

## 2017-03-12 MED ORDER — PACLITAXEL CHEMO INJECTION 300 MG/50ML
175.0000 mg/m2 | Freq: Once | INTRAVENOUS | Status: AC
  Administered 2017-03-12: 474 mg via INTRAVENOUS
  Filled 2017-03-12: qty 79

## 2017-03-12 MED ORDER — PALONOSETRON HCL INJECTION 0.25 MG/5ML
INTRAVENOUS | Status: AC
  Filled 2017-03-12: qty 5

## 2017-03-12 MED ORDER — ALBUTEROL SULFATE (2.5 MG/3ML) 0.083% IN NEBU
2.5000 mg | INHALATION_SOLUTION | Freq: Once | RESPIRATORY_TRACT | Status: AC | PRN
  Administered 2017-03-12: 2.5 mg via RESPIRATORY_TRACT
  Filled 2017-03-12: qty 3

## 2017-03-12 MED ORDER — DIPHENHYDRAMINE HCL 50 MG/ML IJ SOLN
50.0000 mg | Freq: Once | INTRAMUSCULAR | Status: AC
  Administered 2017-03-12: 50 mg via INTRAVENOUS

## 2017-03-12 MED ORDER — PEGFILGRASTIM 6 MG/0.6ML ~~LOC~~ PSKT
PREFILLED_SYRINGE | SUBCUTANEOUS | Status: AC
  Filled 2017-03-12: qty 0.6

## 2017-03-12 MED ORDER — PALONOSETRON HCL INJECTION 0.25 MG/5ML
0.2500 mg | Freq: Once | INTRAVENOUS | Status: AC
  Administered 2017-03-12: 0.25 mg via INTRAVENOUS

## 2017-03-12 MED ORDER — SODIUM CHLORIDE 0.9 % IV SOLN
750.0000 mg | Freq: Once | INTRAVENOUS | Status: AC
  Administered 2017-03-12: 750 mg via INTRAVENOUS
  Filled 2017-03-12: qty 75

## 2017-03-12 MED ORDER — SODIUM CHLORIDE 0.9 % IV SOLN
Freq: Once | INTRAVENOUS | Status: AC
  Administered 2017-03-12: 13:00:00 via INTRAVENOUS

## 2017-03-12 MED ORDER — PEGFILGRASTIM 6 MG/0.6ML ~~LOC~~ PSKT: 6.0000 mg | PREFILLED_SYRINGE | Freq: Once | SUBCUTANEOUS | Status: DC

## 2017-03-12 MED ORDER — SODIUM CHLORIDE 0.9 % IV SOLN
200.0000 mg | Freq: Once | INTRAVENOUS | Status: AC
  Administered 2017-03-12: 200 mg via INTRAVENOUS
  Filled 2017-03-12: qty 8

## 2017-03-12 NOTE — Progress Notes (Unsigned)
1447: Patient verbalized "I'm having trouble breathing." Patient noted to have red discoloration over the face. Paclitaxel stopped. 1L NS opened to gravity. Patient put on 2L O2 [place]. Hypersensitivity reaction emergency protocol initiated. Sandi Mealy, PA present. Patient expressed relief after Albuterol neb and solu-medrol administration. Per Lucianne Lei, [place], Dr. Julien Nordmann orders to proceed with treatment. Patient updated and verbalized understanding. Taxol restarted at 1505 according to eMar @49mL /hr.    1631 Discharge instruction printed and verbally reviewed. Patient and spouse verbalized understanding. Denies concerns at this time.

## 2017-03-12 NOTE — Patient Instructions (Addendum)
[facility] Discharge Instructions for Patients Receiving Chemotherapy  Today you received the following chemotherapy agents Keytruda, Taxol, Carboplatin  To help prevent nausea and vomiting after your treatment, we encourage you to take your nausea medication as directed   If you develop nausea and vomiting that is not controlled by your nausea medication, call the clinic.   BELOW ARE SYMPTOMS THAT SHOULD BE REPORTED IMMEDIATELY:  *FEVER GREATER THAN 100.5 F  *CHILLS WITH OR WITHOUT FEVER  NAUSEA AND VOMITING THAT IS NOT CONTROLLED WITH YOUR NAUSEA MEDICATION  *UNUSUAL SHORTNESS OF BREATH  *UNUSUAL BRUISING OR BLEEDING  TENDERNESS IN MOUTH AND THROAT WITH OR WITHOUT PRESENCE OF ULCERS  *URINARY PROBLEMS  *BOWEL PROBLEMS  UNUSUAL RASH Items with * indicate a potential emergency and should be followed up as soon as possible.  Feel free to call the clinic should you have any questions or concerns. The clinic phone number is [phone].  Please show the [facility] at check-in to the Emergency Department and triage nurse.  Pembrolizumab injection What is this medicine? PEMBROLIZUMAB (pem broe liz ue mab) is a monoclonal antibody. It is used to treat melanoma, head and neck cancer, Hodgkin lymphoma, non-small cell lung cancer, urothelial cancer, stomach cancer, and cancers that have a certain genetic condition. This medicine may be used for other purposes; ask your health care provider or pharmacist if you have questions. COMMON BRAND NAME(S): Keytruda What should I tell my health care provider before I take this medicine? They need to know if you have any of these conditions: -diabetes -immune system problems -inflammatory bowel disease -liver disease -lung or breathing disease -lupus -organ transplant -an unusual or allergic reaction to pembrolizumab, other medicines, foods, dyes, or preservatives -pregnant or trying to get  pregnant -breast-feeding How should I use this medicine? This medicine is for infusion into a vein. It is given by a health care professional in a hospital or clinic setting. A special MedGuide will be given to you before each treatment. Be sure to read this information carefully each time. Talk to your pediatrician regarding the use of this medicine in children. While this drug may be prescribed for selected conditions, precautions do apply. Overdosage: If you think you have taken too much of this medicine contact a poison control center or emergency room at once. NOTE: This medicine is only for you. Do not share this medicine with others. What if I miss a dose? It is important not to miss your dose. Call your doctor or health care professional if you are unable to keep an appointment. What may interact with this medicine? Interactions have not been studied. Give your health care provider a list of all the medicines, herbs, non-prescription drugs, or dietary supplements you use. Also tell them if you smoke, drink alcohol, or use illegal drugs. Some items may interact with your medicine. This list may not describe all possible interactions. Give your health care provider a list of all the medicines, herbs, non-prescription drugs, or dietary supplements you use. Also tell them if you smoke, drink alcohol, or use illegal drugs. Some items may interact with your medicine. What should I watch for while using this medicine? Your condition will be monitored carefully while you are receiving this medicine. You may need blood work done while you are taking this medicine. Do not become pregnant while taking this medicine or for 4 months after stopping it. Women should inform their doctor if they wish to become pregnant or think  they might be pregnant. There is a potential for serious side effects to an unborn child. Talk to your health care professional or pharmacist for more information. Do not breast-feed  an infant while taking this medicine or for 4 months after the last dose. What side effects may I notice from receiving this medicine? Side effects that you should report to your doctor or health care professional as soon as possible: -allergic reactions like skin rash, itching or hives, swelling of the face, lips, or tongue -bloody or black, tarry -breathing problems -changes in vision -chest pain -chills -constipation -cough -dizziness or feeling faint or lightheaded -fast or irregular heartbeat -fever -flushing -hair loss -low blood counts - this medicine may decrease the number of white blood cells, red blood cells and platelets. You may be at increased risk for infections and bleeding. -muscle pain -muscle weakness -persistent headache -signs and symptoms of high blood sugar such as dizziness; dry mouth; dry skin; fruity breath; nausea; stomach pain; increased hunger or thirst; increased urination -signs and symptoms of kidney injury like trouble passing urine or change in the amount of urine -signs and symptoms of liver injury like dark urine, light-colored stools, loss of appetite, nausea, right upper belly pain, yellowing of the eyes or skin -stomach pain -sweating -weight loss Side effects that usually do not require medical attention (report to your doctor or health care professional if they continue or are bothersome): -decreased appetite -diarrhea -tiredness This list may not describe all possible side effects. Call your doctor for medical advice about side effects. You may report side effects to FDA at 1-800-FDA-1088. Where should I keep my medicine? This drug is given in a hospital or clinic and will not be stored at home. NOTE: This sheet is a summary. It may not cover all possible information. If you have questions about this medicine, talk to your doctor, pharmacist, or health care provider.  2018 Elsevier/Gold Standard (2015-12-27 12:29:36)  Paclitaxel  injection What is this medicine? PACLITAXEL (PAK li TAX el) is a chemotherapy drug. It targets fast dividing cells, like cancer cells, and causes these cells to die. This medicine is used to treat ovarian cancer, breast cancer, and other cancers. This medicine may be used for other purposes; ask your health care provider or pharmacist if you have questions. COMMON BRAND NAME(S): Onxol, Taxol What should I tell my health care provider before I take this medicine? They need to know if you have any of these conditions: -blood disorders -irregular heartbeat -infection (especially a virus infection such as chickenpox, cold sores, or herpes) -liver disease -previous or ongoing radiation therapy -an unusual or allergic reaction to paclitaxel, alcohol, polyoxyethylated castor oil, other chemotherapy agents, other medicines, foods, dyes, or preservatives -pregnant or trying to get pregnant -breast-feeding How should I use this medicine? This drug is given as an infusion into a vein. It is administered in a hospital or clinic by a specially trained health care professional. Talk to your pediatrician regarding the use of this medicine in children. Special care may be needed. Overdosage: If you think you have taken too much of this medicine contact a poison control center or emergency room at once. NOTE: This medicine is only for you. Do not share this medicine with others. What if I miss a dose? It is important not to miss your dose. Call your doctor or health care professional if you are unable to keep an appointment. What may interact with this medicine? Do not take this medicine with  any of the following medications: -disulfiram -metronidazole This medicine may also interact with the following medications: -cyclosporine -diazepam -ketoconazole -medicines to increase blood counts like filgrastim, pegfilgrastim, sargramostim -other chemotherapy drugs like cisplatin, doxorubicin, epirubicin,  etoposide, teniposide, vincristine -quinidine -testosterone -vaccines -verapamil Talk to your doctor or health care professional before taking any of these medicines: -acetaminophen -aspirin -ibuprofen -ketoprofen -naproxen This list may not describe all possible interactions. Give your health care provider a list of all the medicines, herbs, non-prescription drugs, or dietary supplements you use. Also tell them if you smoke, drink alcohol, or use illegal drugs. Some items may interact with your medicine. What should I watch for while using this medicine? Your condition will be monitored carefully while you are receiving this medicine. You will need important blood work done while you are taking this medicine. This medicine can cause serious allergic reactions. To reduce your risk you will need to take other medicine(s) before treatment with this medicine. If you experience allergic reactions like skin rash, itching or hives, swelling of the face, lips, or tongue, tell your doctor or health care professional right away. In some cases, you may be given additional medicines to help with side effects. Follow all directions for their use. This drug may make you feel generally unwell. This is not uncommon, as chemotherapy can affect healthy cells as well as cancer cells. Report any side effects. Continue your course of treatment even though you feel ill unless your doctor tells you to stop. Call your doctor or health care professional for advice if you get a fever, chills or sore throat, or other symptoms of a cold or flu. Do not treat yourself. This drug decreases your body's ability to fight infections. Try to avoid being around people who are sick. This medicine may increase your risk to bruise or bleed. Call your doctor or health care professional if you notice any unusual bleeding. Be careful brushing and flossing your teeth or using a toothpick because you may get an infection or bleed more  easily. If you have any dental work done, tell your dentist you are receiving this medicine. Avoid taking products that contain aspirin, acetaminophen, ibuprofen, naproxen, or ketoprofen unless instructed by your doctor. These medicines may hide a fever. Do not become pregnant while taking this medicine. Women should inform their doctor if they wish to become pregnant or think they might be pregnant. There is a potential for serious side effects to an unborn child. Talk to your health care professional or pharmacist for more information. Do not breast-feed an infant while taking this medicine. Men are advised not to father a child while receiving this medicine. This product may contain alcohol. Ask your pharmacist or healthcare provider if this medicine contains alcohol. Be sure to tell all healthcare providers you are taking this medicine. Certain medicines, like metronidazole and disulfiram, can cause an unpleasant reaction when taken with alcohol. The reaction includes flushing, headache, nausea, vomiting, sweating, and increased thirst. The reaction can last from 30 minutes to several hours. What side effects may I notice from receiving this medicine? Side effects that you should report to your doctor or health care professional as soon as possible: -allergic reactions like skin rash, itching or hives, swelling of the face, lips, or tongue -low blood counts - This drug may decrease the number of white blood cells, red blood cells and platelets. You may be at increased risk for infections and bleeding. -signs of infection - fever or chills, cough, sore throat,  pain or difficulty passing urine -signs of decreased platelets or bleeding - bruising, pinpoint red spots on the skin, black, tarry stools, nosebleeds -signs of decreased red blood cells - unusually weak or tired, fainting spells, lightheadedness -breathing problems -chest pain -high or low blood pressure -mouth sores -nausea and  vomiting -pain, swelling, redness or irritation at the injection site -pain, tingling, numbness in the hands or feet -slow or irregular heartbeat -swelling of the ankle, feet, hands Side effects that usually do not require medical attention (report to your doctor or health care professional if they continue or are bothersome): -bone pain -complete hair loss including hair on your head, underarms, pubic hair, eyebrows, and eyelashes -changes in the color of fingernails -diarrhea -loosening of the fingernails -loss of appetite -muscle or joint pain -red flush to skin -sweating This list may not describe all possible side effects. Call your doctor for medical advice about side effects. You may report side effects to FDA at 1-800-FDA-1088. Where should I keep my medicine? This drug is given in a hospital or clinic and will not be stored at home. NOTE: This sheet is a summary. It may not cover all possible information. If you have questions about this medicine, talk to your doctor, pharmacist, or health care provider.  2018 Elsevier/Gold Standard (2015-01-18 19:58:00)  Carboplatin injection What is this medicine? CARBOPLATIN (KAR boe pla tin) is a chemotherapy drug. It targets fast dividing cells, like cancer cells, and causes these cells to die. This medicine is used to treat ovarian cancer and many other cancers. This medicine may be used for other purposes; ask your health care provider or pharmacist if you have questions. COMMON BRAND NAME(S): Paraplatin What should I tell my health care provider before I take this medicine? They need to know if you have any of these conditions: -blood disorders -hearing problems -kidney disease -recent or ongoing radiation therapy -an unusual or allergic reaction to carboplatin, cisplatin, other chemotherapy, other medicines, foods, dyes, or preservatives -pregnant or trying to get pregnant -breast-feeding How should I use this medicine? This  drug is usually given as an infusion into a vein. It is administered in a hospital or clinic by a specially trained health care professional. Talk to your pediatrician regarding the use of this medicine in children. Special care may be needed. Overdosage: If you think you have taken too much of this medicine contact a poison control center or emergency room at once. NOTE: This medicine is only for you. Do not share this medicine with others. What if I miss a dose? It is important not to miss a dose. Call your doctor or health care professional if you are unable to keep an appointment. What may interact with this medicine? -medicines for seizures -medicines to increase blood counts like filgrastim, pegfilgrastim, sargramostim -some antibiotics like amikacin, gentamicin, neomycin, streptomycin, tobramycin -vaccines Talk to your doctor or health care professional before taking any of these medicines: -acetaminophen -aspirin -ibuprofen -ketoprofen -naproxen This list may not describe all possible interactions. Give your health care provider a list of all the medicines, herbs, non-prescription drugs, or dietary supplements you use. Also tell them if you smoke, drink alcohol, or use illegal drugs. Some items may interact with your medicine. What should I watch for while using this medicine? Your condition will be monitored carefully while you are receiving this medicine. You will need important blood work done while you are taking this medicine. This drug may make you feel generally unwell. This is not  uncommon, as chemotherapy can affect healthy cells as well as cancer cells. Report any side effects. Continue your course of treatment even though you feel ill unless your doctor tells you to stop. In some cases, you may be given additional medicines to help with side effects. Follow all directions for their use. Call your doctor or health care professional for advice if you get a fever, chills or sore  throat, or other symptoms of a cold or flu. Do not treat yourself. This drug decreases your body's ability to fight infections. Try to avoid being around people who are sick. This medicine may increase your risk to bruise or bleed. Call your doctor or health care professional if you notice any unusual bleeding. Be careful brushing and flossing your teeth or using a toothpick because you may get an infection or bleed more easily. If you have any dental work done, tell your dentist you are receiving this medicine. Avoid taking products that contain aspirin, acetaminophen, ibuprofen, naproxen, or ketoprofen unless instructed by your doctor. These medicines may hide a fever. Do not become pregnant while taking this medicine. Women should inform their doctor if they wish to become pregnant or think they might be pregnant. There is a potential for serious side effects to an unborn child. Talk to your health care professional or pharmacist for more information. Do not breast-feed an infant while taking this medicine. What side effects may I notice from receiving this medicine? Side effects that you should report to your doctor or health care professional as soon as possible: -allergic reactions like skin rash, itching or hives, swelling of the face, lips, or tongue -signs of infection - fever or chills, cough, sore throat, pain or difficulty passing urine -signs of decreased platelets or bleeding - bruising, pinpoint red spots on the skin, black, tarry stools, nosebleeds -signs of decreased red blood cells - unusually weak or tired, fainting spells, lightheadedness -breathing problems -changes in hearing -changes in vision -chest pain -high blood pressure -low blood counts - This drug may decrease the number of white blood cells, red blood cells and platelets. You may be at increased risk for infections and bleeding. -nausea and vomiting -pain, swelling, redness or irritation at the injection site -pain,  tingling, numbness in the hands or feet -problems with balance, talking, walking -trouble passing urine or change in the amount of urine Side effects that usually do not require medical attention (report to your doctor or health care professional if they continue or are bothersome): -hair loss -loss of appetite -metallic taste in the mouth or changes in taste This list may not describe all possible side effects. Call your doctor for medical advice about side effects. You may report side effects to FDA at 1-800-FDA-1088. Where should I keep my medicine? This drug is given in a hospital or clinic and will not be stored at home. NOTE: This sheet is a summary. It may not cover all possible information. If you have questions about this medicine, talk to your doctor, pharmacist, or health care provider.  2018 Elsevier/Gold Standard (2007-06-24 14:38:05)

## 2017-03-12 NOTE — Assessment & Plan Note (Signed)
This is a very pleasant 66 year old white male recently diagnosed with a stage IV (T2a, N3, M1c) non-small cell lung cancer, squamous cell carcinoma presented with right upper lobe lung mass in addition to right hilar and mediastinal adenopathy as well as metastatic disease in the medial right thigh diagnosed in November 2018.  I reviewed the MRI of the brain results with the patient and his wife which showed no evidence of metastatic disease.  I also reviewed the CT of the femur which showed a metastatic lesion to the soft tissue to the right medial thigh.  Recommend the patient proceed with cycle 1 of his chemotherapy as scheduled today.  Chemotherapy will consist of carboplatin for an AUC of 5, paclitaxel 125 mg/m, and Keytruda 200 mg IV every 3 weeks.  I reviewed with him the adverse effect of this treatment including but not limited to alopecia, myelosuppression, nausea and vomiting, peripheral neuropathy, liver or renal dysfunction as well as the immunotherapy adverse effect including skin rash, diarrhea, inflammation of the lung, kidney, liver, thyroid or other endocrine dysfunction including type 1 diabetes mellitus. The patient will have weekly labs. He will have a return visit in 3 weeks for evaluation prior to cycle 2 of his chemotherapy.  The patient will proceed with palliative radiotherapy to the metastatic mass in the right thigh as scheduled by radiation oncology.   The patient was advised to call immediately if he has any concerning symptoms in the interval.  All questions were answered. The patient knows to call the clinic with any problems, questions or concerns. We can certainly see the patient much sooner if necessary.

## 2017-03-12 NOTE — Progress Notes (Signed)
[facility] OFFICE PROGRESS NOTE  Sheryle Hail, PA-C [address] [place] [id number]  DIAGNOSIS: Stage IV (T2a, N3, M1c) non-small cell lung cancer, squamous cell carcinoma presented with right upper lobe lung mass in addition to right hilar and mediastinal adenopathy as well as metastatic disease in the medial right thigh diagnosed in November 2018.  PRIOR THERAPY: None  CURRENT THERAPY: Carboplatin for AUC of 5, paclitaxel 175 mg/M2 and Keytruda 200 mg IV every 3 weeks. First dose on 03/12/2017.  INTERVAL HISTORY: Aaron Sellers 66 y.o. male returns for routine follow-up visit accompanied by his wife.  The patient is feeling fine today and has no complaints today for ongoing pain to his right medial thigh.  He takes 2-3 ibuprofen per day which controls his pain.  The patient thinks that the mass is getting larger.  The patient denies fevers and chills.  Denies chest pain, shortness of breath, hemoptysis.  He has a nonproductive cough.  Denies nausea, vomiting, constipation, diarrhea.  Patient is here for evaluation prior to cycle 1 of his chemotherapy and to review his MRI of the brain and CT of the femur results.  MEDICAL HISTORY: Past Medical History:  Diagnosis Date  . Arthritis   . BPH (benign prostatic hypertrophy) with urinary retention   . Chronic kidney disease    hx of kidney stones  2011  . COPD (chronic obstructive pulmonary disease) ([place])    has been smoking since he was 22 ish--  . High cholesterol   . Hypertension    dx around 2011    ALLERGIES:  is allergic to ciprofloxacin; adhesive [tape]; and codeine.  MEDICATIONS:  Current Outpatient Medications  Medication Sig Dispense Refill  . atorvastatin (LIPITOR) 10 MG tablet Take 10 mg by mouth daily.    Marland Kitchen diltiazem (CARDIZEM CD) 240 MG 24 hr capsule Take 1 capsule (240 mg total) by mouth daily. 30 capsule 2  . diphenhydrAMINE (BENADRYL) 25 MG tablet Take 25 mg every 6 (six) hours as needed by  mouth.    . famotidine (PEPCID) 10 MG tablet Take 10 mg by mouth as needed for heartburn or indigestion.    . hydrocortisone 2.5 % cream Apply 1 application topically 2 (two) times daily as needed (skin irritations).     Marland Kitchen ibuprofen (ADVIL,MOTRIN) 200 MG tablet Take 600-800 mg by mouth every 6 (six) hours as needed for mild pain.     Marland Kitchen prochlorperazine (COMPAZINE) 10 MG tablet Take 1 tablet (10 mg total) by mouth every 6 (six) hours as needed for nausea or vomiting. (Patient not taking: Reported on 03/05/2017) 30 tablet 0  . tamsulosin (FLOMAX) 0.4 MG CAPS capsule Take 0.4 mg by mouth daily.     No current facility-administered medications for this visit.    Facility-Administered Medications Ordered in Other Visits  Medication Dose Route Frequency Provider Last Rate Last Dose  . 0.9 %  sodium chloride infusion   Intravenous Once Curt Bears, MD      . CARBOplatin (PARAPLATIN) 750 mg in sodium chloride 0.9 % 250 mL chemo infusion  750 mg Intravenous Once Curt Bears, MD      . heparin lock flush 100 unit/mL  500 Units Intracatheter Once PRN Curt Bears, MD      . PACLitaxel (TAXOL) 474 mg in dextrose 5 % 500 mL chemo infusion (> '80mg'$ /m2)  175 mg/m2 (Treatment Plan Recorded) Intravenous Once Curt Bears, MD 193 mL/hr at 03/12/17 1551 474 mg at 03/12/17 1551  .  pegfilgrastim (NEULASTA ONPRO KIT) injection 6 mg  6 mg Subcutaneous Once Curt Bears, MD      . sodium chloride flush (NS) 0.9 % injection 10 mL  10 mL Intracatheter PRN Curt Bears, MD      . sodium chloride flush (NS) 0.9 % injection 10 mL  10 mL Intracatheter PRN Curt Bears, MD        SURGICAL HISTORY:  Past Surgical History:  Procedure Laterality Date  . HERNIA REPAIR     umbilical hernia  05/5850  . INCISION AND DRAINAGE ABSCESS     "down the middle" of his buttocks  2015  . TOTAL KNEE ARTHROPLASTY Left 10/29/2014   Procedure: TOTAL KNEE ARTHROPLASTY;  Surgeon: Dorna Leitz, MD;  Location: [facility];   Service: Orthopedics;  Laterality: Left;    REVIEW OF SYSTEMS:   Review of Systems  Constitutional: Negative for appetite change, chills, fatigue, fever and unexpected weight change.  HENT:   Negative for mouth sores, nosebleeds, sore throat and trouble swallowing.   Eyes: Negative for eye problems and icterus.  Respiratory: Negative for hemoptysis, shortness of breath and wheezing.  Positive for nonproductive cough. Cardiovascular: Negative for chest pain and leg swelling.  Gastrointestinal: Negative for abdominal pain, constipation, diarrhea, nausea and vomiting.  Genitourinary: Negative for bladder incontinence, difficulty urinating, dysuria, frequency and hematuria.   Musculoskeletal: Negative for back pain, gait problem, neck pain and neck stiffness.  Skin: Negative for itching and rash.  Neurological: Negative for dizziness, extremity weakness, gait problem, headaches, light-headedness and seizures.  Hematological: Negative for adenopathy. Does not bruise/bleed easily.  Psychiatric/Behavioral: Negative for confusion, depression and sleep disturbance. The patient is not nervous/anxious.     PHYSICAL EXAMINATION:  Blood pressure 138/72, pulse 64, temperature 98.6 F (37 C), temperature source Oral, resp. rate 19, height '6\' 7"'$  (2.007 m), weight 292 lb 9.6 oz (132.7 kg), SpO2 98 %.  ECOG PERFORMANCE STATUS: 1 - Symptomatic but completely ambulatory  Physical Exam  Constitutional: Oriented to person, place, and time and well-developed, well-nourished, and in no distress. No distress.  HENT:  Head: Normocephalic and atraumatic.  Mouth/Throat: Oropharynx is clear and moist. No oropharyngeal exudate.  Eyes: Conjunctivae are normal. Right eye exhibits no discharge. Left eye exhibits no discharge. No scleral icterus.  Neck: Normal range of motion. Neck supple.  Cardiovascular: Normal rate, regular rhythm, normal heart sounds and intact distal pulses.   Pulmonary/Chest: Effort normal and  breath sounds normal. No respiratory distress. No wheezes. No rales.  Abdominal: Soft. Bowel sounds are normal. Exhibits no distension and no mass. There is no tenderness.  Musculoskeletal: Normal range of motion. Exhibits no edema. Palpable soft tissue mass to the right medial thigh measuring approximately 5 x 4 cm. Lymphadenopathy:    No cervical adenopathy.  Neurological: Alert and oriented to person, place, and time. Exhibits normal muscle tone. Gait normal. Coordination normal.  Skin: Skin is warm and dry. No rash noted. Not diaphoretic. No erythema. No pallor.  Psychiatric: Mood, memory and judgment normal.  Vitals reviewed.  LABORATORY DATA: Lab Results  Component Value Date   WBC 9.8 03/12/2017   HGB 14.2 03/12/2017   HCT 42.6 03/12/2017   MCV 85.0 03/12/2017   PLT 209 03/12/2017      Chemistry      Component Value Date/Time   NA 139 03/12/2017 1111   K 3.7 03/12/2017 1111   CL 102 01/30/2017 0442   CO2 24 03/12/2017 1111   BUN 12.0 03/12/2017 1111  CREATININE 0.8 03/12/2017 1111      Component Value Date/Time   CALCIUM 8.9 03/12/2017 1111   ALKPHOS 91 03/12/2017 1111   AST 15 03/12/2017 1111   ALT 19 03/12/2017 1111   BILITOT 0.69 03/12/2017 1111       RADIOGRAPHIC STUDIES:  Mr Jeri Cos QP Contrast  Result Date: 03/08/2017 CLINICAL DATA:  Squamous cell carcinoma of the right lung.  Staging. EXAM: MRI HEAD WITHOUT AND WITH CONTRAST TECHNIQUE: Multiplanar, multiecho pulse sequences of the brain and surrounding structures were obtained without and with intravenous contrast. CONTRAST:  78m MULTIHANCE GADOBENATE DIMEGLUMINE 529 MG/ML IV SOLN COMPARISON:  None. FINDINGS: Brain: Diffusion imaging does not show any acute or subacute infarction. There chronic small-vessel ischemic changes of the pons in the cerebral hemispheric white matter, mild to moderate in degree. No cortical or large vessel territory infarction. No evidence of primary or metastatic mass lesion,  hemorrhage, hydrocephalus or extra-axial collection. No abnormal contrast enhancement. Vascular: Major vessels at the base of the brain show flow. Skull and upper cervical spine: Negative Sinuses/Orbits: Clear/normal Other: None IMPRESSION: No evidence of metastatic disease. Moderate chronic small-vessel ischemic changes of the white matter. Electronically Signed   By: MNelson ChimesM.D.   On: 03/08/2017 16:01   Ct Femur Right W Contrast  Result Date: 03/08/2017 CLINICAL DATA:  Right thigh metastasis from lung cancer. Assess treatment response. EXAM: CT OF THE LOWER RIGHT EXTREMITY WITH CONTRAST TECHNIQUE: Multidetector CT imaging of the lower right extremity was performed according to the standard protocol following intravenous contrast administration. COMPARISON:  Right lower extremity ultrasound dated February 18, 2017. CONTRAST:  755mISOVUE-300 IOPAMIDOL (ISOVUE-300) INJECTION 61% FINDINGS: Bones/Joint/Cartilage No acute fracture or malalignment. Mild degenerative changes of the right hip and knee. Chondrocalcinosis of the knee. No periosteal reaction or focal bone lesion. Degenerative disc disease at L5-S1. Ligaments Suboptimally assessed by CT. Muscles and Tendons Within the mid right sartorius muscle, there is a heterogeneously enhancing, partially necrotic mass measuring approximately 5.2 x 4.8 x 8.4 cm (AP by transverse by CC). This previously measured 5.3 x 3.6 x 7.8 cm on prior ultrasound. No additional enhancing lesions are identified. Soft tissues No lymphadenopathy. Moderate right hydrocele. Atherosclerotic vascular calcifications. IMPRESSION: 1. Heterogeneously enhancing, partially necrotic metastasis within the mid right sartorius muscle, slightly increased in size when compared to prior ultrasound, although this may be accounted for by differences in modalities. Electronically Signed   By: WiTitus Dubin.D.   On: 03/08/2017 17:32   Nm Pet Image Initial (pi) Skull Base To Thigh  Result  Date: 02/16/2017 CLINICAL DATA:  Evaluate lung mass. EXAM: NUCLEAR MEDICINE PET SKULL BASE TO THIGH TECHNIQUE: 14.0 mCi F-18 FDG was injected intravenously. Full-ring PET imaging was performed from the skull base to thigh after the radiotracer. CT data was obtained and used for attenuation correction and anatomic localization. FASTING BLOOD GLUCOSE:  Value: 98 mg/dl COMPARISON:  CT scan of the chest January 27, 2017. CT scan of the abdomen and pelvis January 26, 2017. FINDINGS: NECK: There is increased uptake in a lymph node just deep to the right submandibular gland on series 4, image 23. The lymph node measures 10 mm in short axis with a maximum SUV of 11.3. Uptake in a left cervical facet on image 29 is consistent with degenerative change. Mild uptake in the left parotid gland on image 20 is likely either a node within the parotid or benign lesion with low level uptake and a maximum SUV of 3.8.  No other suspicious uptake in the head or neck regions. CHEST: The mass in the right upper lobe measures 3.2 by 2.6 cm on series 8, image 23, not changed in the interval with a maximum SUV of 13.5, consistent with malignancy. FDG avid metastatic nodes are seen in the right paratracheal region on image 62, to the right of the carina on image 69, and in the right hilum on image 76. The right hilar node measures 3 cm with a maximum SUV of 30. No other FDG avid disease in the chest. ABDOMEN/PELVIS: The patient has known right adrenal nodule is again identified measuring up to 3.2 cm, unchanged. On precontrast images, the posterior aspect of the nodule demonstrates an attenuation of less than 0 Hounsfield units. There is low level uptake in the anteromedial aspect of the right adrenal gland on image 118 with a maximum SUV of 3.7 compare 2 background hepatic activity of 4.0 as measured on image 110. The majority of the right adrenal gland demonstrates no uptake. The focal wall thickening and surrounding fat stranding associated  with the sigmoid colon seen on series 4, images 163 173 has improved in the interval. The suspected small extraluminal gas collection adjacent the inflamed sigmoid colon persists but is smaller in the interval. There is focal FDG uptake in this region with a maximum SUV of 15.3. No other abnormal uptake in the soft tissues of the abdomen. Mild uptake adjacent to the left ischium is likely degenerative. No other abnormal uptake in the pelvis. SKELETON: No focal hypermetabolic activity to suggest skeletal metastasis. IMPRESSION: 1. The patient's right upper lobe pulmonary mass is FDG avid consistent with a primary malignancy. There is FDG avid metastatic disease to a right level 2 cervical lymph node, to right mediastinal nodes, and a right hilar node. 2. The right adrenal mass demonstrates an attenuation of less than 0 Hounsfield units posteriorly and the level of uptake in the right adrenal gland is slightly less than hepatic background suggesting the right adrenal nodule is an adenoma rather than a metastasis. Recommend attention on follow-up. 3. The region of focal sigmoid thickening and adjacent fat stranding with a suspected small extraluminal gas collection likely represents continued sequela of diverticulitis given the recent CT scan. Recommend follow-up to complete resolution of the patient's symptoms and imaging findings. A colonoscopy can of course better evaluate the underlying colon if clinically warranted. Electronically Signed   By: Dorise Bullion III M.D   On: 02/16/2017 07:40   US Venous Img Lower Unilateral Right  Result Date: 02/18/2017 CLINICAL DATA:  Right lower extremity pain with palpable mass. Recent diagnosis of right upper lobe lung mass with abnormal metabolic activity by PET scan and evidence of metastatic disease to right cervical lymph node, mediastinal nodes and a right hilar node. EXAM: RIGHT LOWER EXTREMITY VENOUS DOPPLER ULTRASOUND TECHNIQUE: Gray-scale sonography with graded  compression, as well as color Doppler and duplex ultrasound were performed to evaluate the lower extremity deep venous systems from the level of the common femoral vein and including the common femoral, femoral, profunda femoral, popliteal and calf veins including the posterior tibial, peroneal and gastrocnemius veins when visible. The superficial great saphenous vein was also interrogated. Spectral Doppler was utilized to evaluate flow at rest and with distal augmentation maneuvers in the common femoral, femoral and popliteal veins. COMPARISON:  None. FINDINGS: Contralateral Common Femoral Vein: Respiratory phasicity is normal and symmetric with the symptomatic side. No evidence of thrombus. Normal compressibility. Common Femoral Vein: No evidence  of thrombus. Normal compressibility, respiratory phasicity and response to augmentation. Saphenofemoral Junction: No evidence of thrombus. Normal compressibility and flow on color Doppler imaging. Profunda Femoral Vein: No evidence of thrombus. Normal compressibility and flow on color Doppler imaging. Femoral Vein: No evidence of thrombus. Normal compressibility, respiratory phasicity and response to augmentation. Popliteal Vein: No evidence of thrombus. Normal compressibility, respiratory phasicity and response to augmentation. Calf Veins: No evidence of thrombus. Normal compressibility and flow on color Doppler imaging. Superficial Great Saphenous Vein: No evidence of thrombus. Normal compressibility. Venous Reflux:  None. Other Findings: No evidence of superficial thrombophlebitis. There is a discrete abnormal soft tissue mass in the mid thigh measuring up to 7.8 x 3.6 x 5.3 cm. This is concerning for a soft tissue metastasis given the presence of a lung mass and lymph node metastatic disease in the chest and neck by PET scan. IMPRESSION: 1. No evidence of right lower extremity DVT. 2. Soft tissue mass of the right mid thigh measuring nearly 8 cm in greatest diameter  and appearing likely malignant by ultrasound. This is concerning for a soft tissue metastasis from lung carcinoma based on recent PET scan findings. Consider biopsy of this mass versus the right cervical lymph node metastasis for tissue diagnosis. Electronically Signed   By: Aletta Edouard M.D.   On: 02/18/2017 16:35   [place] Core Biopsy (lymph Nodes)  Result Date: 02/22/2017 INDICATION: 66 year old with right lung lesion and concern for metastatic lung cancer. FDG positive lymph node in the right upper neck. EXAM: ULTRASOUND GUIDED FINE NEEDLE ASPIRATION OF RIGHT CERVICAL LYMPH NODE MEDICATIONS: None. ANESTHESIA/SEDATION: Fentanyl 100 mcg IV; Versed 3.0 mg IV Moderate Sedation Time:  20 minutes The patient was continuously monitored during the procedure by the interventional radiology nurse under my direct supervision. PROCEDURE: The procedure was explained to the patient. The risks and benefits of the procedure were discussed and the patient's questions were addressed. Informed consent was obtained from the patient. Right side of the neck was evaluated with ultrasound. Hypoechoic lymph node posterior to the right parotid gland was identified and targeted. The overlying skin was prepped with chlorhexidine. A sterile field was created. Skin was anesthetized with 1% lidocaine. Using ultrasound guidance, 7 fine-needle aspirations were performed in this hypoechoic lymph node with 25 gauge needles. Bandage placed over the puncture site. COMPLICATIONS: None immediate. FINDINGS: Hypoechoic lymph node in the right upper neck, just posterior to the right parotid gland. No significant bleeding or hematoma formation following the fine-needle aspirations. IMPRESSION: Ultrasound-guided fine-needle aspirations of a right cervical lymph node. Electronically Signed   By: Markus Daft M.D.   On: 02/22/2017 15:17   US Biopsy (abdominal Retropertioneal Mass)  Result Date: 02/22/2017 INDICATION: 66 year old with a right lung  lesion and indeterminate right thigh soft tissue lesion. EXAM: ULTRASOUND-GUIDED CORE BIOPSY OF RIGHT THIGH SOFT TISSUE MASS MEDICATIONS: None. ANESTHESIA/SEDATION: Moderate sedation was given for the right neck biopsy which was immediately prior to this procedure. FLUOROSCOPY TIME:  None COMPLICATIONS: None immediate. PROCEDURE: Informed written consent was obtained from the patient after a thorough discussion of the procedural risks, benefits and alternatives. All questions were addressed. A timeout was performed prior to the initiation of the procedure. Ultrasound demonstrated a hypoechoic mass along the medial right thigh. The overlying skin was prepped with chlorhexidine and sterile field was created. Skin and soft tissues were anesthetized with 1% lidocaine. 17 gauge coaxial needle was directed into the lesion with ultrasound guidance. Total of 4 core biopsies were obtained with  18 gauge core device. Specimens placed in formalin. 17 gauge needle was removed without complication. Bandage placed over the puncture site. FINDINGS: Large oval shaped hypoechoic mass in the medial right thigh. Four core biopsies obtained from the lesion. No significant bleeding or hematoma formation following the core biopsies. IMPRESSION: Successful ultrasound-guided core biopsies of the right thigh mass. Electronically Signed   By: Markus Daft M.D.   On: 02/22/2017 17:15     ASSESSMENT/PLAN:  Stage IV squamous cell carcinoma of right lung [facility]) This is a very pleasant 66 year old white male recently diagnosed with a stage IV (T2a, N3, M1c) non-small cell lung cancer, squamous cell carcinoma presented with right upper lobe lung mass in addition to right hilar and mediastinal adenopathy as well as metastatic disease in the medial right thigh diagnosed in November 2018.  I reviewed the MRI of the brain results with the patient and his wife which showed no evidence of metastatic disease.  I also reviewed the CT of the femur  which showed a metastatic lesion to the soft tissue to the right medial thigh.  Recommend the patient proceed with cycle 1 of his chemotherapy as scheduled today.  Chemotherapy will consist of carboplatin for an AUC of 5, paclitaxel 125 mg/m, and Keytruda 200 mg IV every 3 weeks.  I reviewed with him the adverse effect of this treatment including but not limited to alopecia, myelosuppression, nausea and vomiting, peripheral neuropathy, liver or renal dysfunction as well as the immunotherapy adverse effect including skin rash, diarrhea, inflammation of the lung, kidney, liver, thyroid or other endocrine dysfunction including type 1 diabetes mellitus. The patient will have weekly labs. He will have a return visit in 3 weeks for evaluation prior to cycle 2 of his chemotherapy.  The patient will proceed with palliative radiotherapy to the metastatic mass in the right thigh as scheduled by radiation oncology.   The patient was advised to call immediately if he has any concerning symptoms in the interval.  All questions were answered. The patient knows to call the clinic with any problems, questions or concerns. We can certainly see the patient much sooner if necessary.  No orders of the defined types were placed in this encounter.   Mikey Bussing, DNP, AGPCNP-BC, AOCNP 03/12/17

## 2017-03-13 NOTE — Progress Notes (Signed)
[facility] Progress Note   Aaron Sellers [id number] 1950-07-22 66 y.o.    Aaron Sellers is managed by Dr. Eilleen Kempf  Actively treated with chemotherapy: yes  Current Therapy:   Keytruda, paclitaxel, and carboplatinum    Last Treated: 03/12/2017  Assessment: Plan:    Infusion reaction, initial encounter  Aaron Sellers was seen in the infusion room for a suspected chemotherapy reaction. He was receiving Taxol at the time of his reaction.  He had completed his infusion of Keytruda. Taxol had just started at the time of the patient's reaction. His symptoms included: Erythema of the face and neck, flushing, and shortness of breath. He was premedicated with Benadryl 50 mg, Aloxi, Pepcid, and Decadron 20 mg IV prior to starting chemotherapy. Taxol was paused and Aaron Sellers was given Solu-Medrol 125 mg IV and an albuterol nebulizer treatment after onset of his symptoms. Aaron Sellers did  respond to intervention.   This case was discussed with Dr. Julien Nordmann.  The nursing staff inquire if the patient could receive Solu-Medrol as a premed in the future.    Please see After Visit Summary for patient specific instructions.  Future Appointments  Date Time Provider [place]  03/18/2017 12:35 PM Kyung Rudd, MD [facility] None  03/19/2017 11:30 AM [facility] LAB 2 [facility] None  03/19/2017  5:00 PM [facility] [id number] [facility] None  03/20/2017  4:45 PM [facility] [id number] [facility] None  03/21/2017  4:45 PM [facility] [id number] [facility] None  03/22/2017  4:45 PM [facility] [id number] [facility] None  03/25/2017 12:45 PM [facility] [id number] [facility] None  03/27/2017 11:30 AM [facility] LAB 3 [facility] None  03/27/2017  4:00 PM [facility] [id number] [facility] None  03/28/2017  3:45 PM [facility] [id number] [facility] None  03/29/2017  1:15 PM [facility] [id number] [facility] None  04/01/2017  3:30 PM [facility] [id number]  [facility] None  04/03/2017 10:30 AM [facility] LAB 4 [facility] None  04/03/2017 11:00 AM Curcio, Kristin R, NP [facility] None  04/03/2017 12:00 PM [facility] F19 [facility] None  04/03/2017  3:00 PM [facility] [id number] [facility] None  04/04/2017  1:00 PM [facility] [id number] [facility] None  04/05/2017  1:30 PM [facility] [id number] [facility] None  04/08/2017  1:30 PM [facility] [id number] [facility] None  04/09/2017 11:30 AM [facility] LAB 4 [facility] None  04/09/2017  1:30 PM [facility] [id number] [facility] None  04/16/2017 11:30 AM [facility] LAB 3 [facility] None  04/23/2017 11:00 AM [facility] LAB 6 [facility] None  04/23/2017 11:30 AM Curcio, Kristin R, NP [facility] None  04/23/2017 12:30 PM [facility] G23 [facility] None  04/30/2017 11:30 AM [facility] LAB 4 [facility] None  05/07/2017 11:30 AM [facility] LAB 6 [facility] None  05/14/2017 10:45 AM [facility] LAB 2 [facility] None  05/14/2017 11:15 AM Curt Bears, MD [facility] None  05/14/2017 12:15 PM [facility] H31 [facility] None  05/16/2017  1:00 PM Strader, Fransisco Hertz, PA-C [id number]    No orders of the defined types were placed in this encounter.      Subjective:   Patient ID:  Aaron Sellers is a 66 y.o. (DOB June 11, 1950) male.  Chief Complaint: No chief complaint on file.   HPI Aaron Sellers was seen in the infusion room for a suspected chemotherapy reaction. He was receiving Taxol at the time of his reaction.  He had completed his infusion of Keytruda. Taxol had just started at the time of the patient's reaction. His symptoms included: Erythema of the face and neck, flushing, and shortness of breath. He was premedicated with Benadryl 50 mg, Aloxi, Pepcid,  and Decadron 20 mg IV prior to starting chemotherapy. Taxol was paused and Aaron Sellers was given Solu-Medrol 125 mg IV and an albuterol nebulizer treatment after onset of his symptoms. Aaron Sellers did  respond to intervention. This case  was discussed with Dr. Julien Nordmann.  The nursing staff inquire if the patient could receive Solu-Medrol as a premed in the future.    Medications: I have reviewed the patient's current medications.  Allergies:  Allergies  Allergen Reactions  . Ciprofloxacin     HIVES  . Adhesive [Tape] Other (See Comments)    Skin peels  . Codeine Other (See Comments)    Jitters, "skin crawling"    Past Medical History:  Diagnosis Date  . Arthritis   . BPH (benign prostatic hypertrophy) with urinary retention   . Chronic kidney disease    hx of kidney stones  2011  . COPD (chronic obstructive pulmonary disease) ([place])    has been smoking since he was 22 ish--  . High cholesterol   . Hypertension    dx around 2011    Past Surgical History:  Procedure Laterality Date  . HERNIA REPAIR     umbilical hernia  04/6107  . INCISION AND DRAINAGE ABSCESS     "down the middle" of his buttocks  2015  . TOTAL KNEE ARTHROPLASTY Left 10/29/2014   Procedure: TOTAL KNEE ARTHROPLASTY;  Surgeon: Dorna Leitz, MD;  Location: [place];  Service: Orthopedics;  Laterality: Left;    Family History  Problem Relation Age of Onset  . Hypertension Mother   . CVA Father   . Lung cancer Sister   . Lung cancer Brother   . Hypertension Brother   . Hypertension Sister     Social History   Socioeconomic History  . Marital status: Single    Spouse name: Not on file  . Number of children: Not on file  . Years of education: Not on file  . Highest education level: Not on file  Social Needs  . Financial resource strain: Not on file  . Food insecurity - worry: Not on file  . Food insecurity - inability: Not on file  . Transportation needs - medical: Not on file  . Transportation needs - non-medical: Not on file  Occupational History  . Not on file  Tobacco Use  . Smoking status: Former Smoker    Packs/day: 1.00    Years: 40.00    Pack years: 40.00    Types: Cigarettes    Last attempt to quit: 01/27/2017    Years  since quitting: 0.1  . Smokeless tobacco: Former Systems developer    Types: Snuff  Substance and Sexual Activity  . Alcohol use: No  . Drug use: No  . Sexual activity: Not on file  Other Topics Concern  . Not on file  Social History Narrative  . Not on file    Past Medical History, Surgical history, Social history, and Family history were reviewed and updated as appropriate.   Please see review of systems for further details on the patient's review from today.   Review of Systems:  Review of Systems  Respiratory: Positive for cough and shortness of breath. Negative for chest tightness and wheezing.   Cardiovascular: Negative for chest pain.  Skin: Positive for color change.       Erythema of the face and neck.    Objective:   Physical Exam:  There were no vitals taken for this visit.   Physical Exam  Constitutional: No  distress.  HENT:  Head: Normocephalic and atraumatic.  Cardiovascular: Normal rate, regular rhythm and normal heart sounds. Exam reveals no gallop and no friction rub.  No murmur heard. Pulmonary/Chest: Effort normal. No respiratory distress. He has wheezes (Discrete inspiratory wheeze in the left upper lung.). He has no rales.  Musculoskeletal: He exhibits no edema.  Neurological: He is alert. Coordination normal.  Skin: Skin is warm and dry. He is not diaphoretic. There is erythema.  Erythema of the face and neck.    Lab Review:     Component Value Date/Time   NA 139 03/12/2017 1111   K 3.7 03/12/2017 1111   CL 102 01/30/2017 0442   CO2 24 03/12/2017 1111   GLUCOSE 160 (H) 03/12/2017 1111   BUN 12.0 03/12/2017 1111   CREATININE 0.8 03/12/2017 1111   CALCIUM 8.9 03/12/2017 1111   PROT 6.6 03/12/2017 1111   ALBUMIN 3.4 (L) 03/12/2017 1111   AST 15 03/12/2017 1111   ALT 19 03/12/2017 1111   ALKPHOS 91 03/12/2017 1111   BILITOT 0.69 03/12/2017 1111   GFRNONAA >60 01/30/2017 0442   GFRAA >60 01/30/2017 0442       Component Value Date/Time   WBC 9.8  03/12/2017 1111   WBC 9.9 02/22/2017 1206   RBC 5.01 03/12/2017 1111   RBC 5.19 02/22/2017 1206   HGB 14.2 03/12/2017 1111   HCT 42.6 03/12/2017 1111   PLT 209 03/12/2017 1111   MCV 85.0 03/12/2017 1111   MCH 28.3 03/12/2017 1111   MCH 28.5 02/22/2017 1206   MCHC 33.3 03/12/2017 1111   MCHC 33.3 02/22/2017 1206   RDW 13.3 03/12/2017 1111   LYMPHSABS 2.4 03/12/2017 1111   MONOABS 0.8 03/12/2017 1111   EOSABS 0.3 03/12/2017 1111   BASOSABS 0.1 03/12/2017 1111   -------------------------------  Imaging from last 24 hours (if applicable):  Radiology interpretation: Mr Jeri Cos RW Contrast  Result Date: 03/08/2017 CLINICAL DATA:  Squamous cell carcinoma of the right lung.  Staging. EXAM: MRI HEAD WITHOUT AND WITH CONTRAST TECHNIQUE: Multiplanar, multiecho pulse sequences of the brain and surrounding structures were obtained without and with intravenous contrast. CONTRAST:  82mL MULTIHANCE GADOBENATE DIMEGLUMINE 529 MG/ML IV SOLN COMPARISON:  None. FINDINGS: Brain: Diffusion imaging does not show any acute or subacute infarction. There chronic small-vessel ischemic changes of the pons in the cerebral hemispheric white matter, mild to moderate in degree. No cortical or large vessel territory infarction. No evidence of primary or metastatic mass lesion, hemorrhage, hydrocephalus or extra-axial collection. No abnormal contrast enhancement. Vascular: Major vessels at the base of the brain show flow. Skull and upper cervical spine: Negative Sinuses/Orbits: Clear/normal Other: None IMPRESSION: No evidence of metastatic disease. Moderate chronic small-vessel ischemic changes of the white matter. Electronically Signed   By: Nelson Chimes M.D.   On: 03/08/2017 16:01   Ct Femur Right W Contrast  Result Date: 03/08/2017 CLINICAL DATA:  Right thigh metastasis from lung cancer. Assess treatment response. EXAM: CT OF THE LOWER RIGHT EXTREMITY WITH CONTRAST TECHNIQUE: Multidetector CT imaging of the lower  right extremity was performed according to the standard protocol following intravenous contrast administration. COMPARISON:  Right lower extremity ultrasound dated February 18, 2017. CONTRAST:  39mL ISOVUE-300 IOPAMIDOL (ISOVUE-300) INJECTION 61% FINDINGS: Bones/Joint/Cartilage No acute fracture or malalignment. Mild degenerative changes of the right hip and knee. Chondrocalcinosis of the knee. No periosteal reaction or focal bone lesion. Degenerative disc disease at L5-S1. Ligaments Suboptimally assessed by CT. Muscles and Tendons Within the mid right  sartorius muscle, there is a heterogeneously enhancing, partially necrotic mass measuring approximately 5.2 x 4.8 x 8.4 cm (AP by transverse by CC). This previously measured 5.3 x 3.6 x 7.8 cm on prior ultrasound. No additional enhancing lesions are identified. Soft tissues No lymphadenopathy. Moderate right hydrocele. Atherosclerotic vascular calcifications. IMPRESSION: 1. Heterogeneously enhancing, partially necrotic metastasis within the mid right sartorius muscle, slightly increased in size when compared to prior ultrasound, although this may be accounted for by differences in modalities. Electronically Signed   By: Titus Dubin M.D.   On: 03/08/2017 17:32   Nm Pet Image Initial (pi) Skull Base To Thigh  Result Date: 02/16/2017 CLINICAL DATA:  Evaluate lung mass. EXAM: NUCLEAR MEDICINE PET SKULL BASE TO THIGH TECHNIQUE: 14.0 mCi F-18 FDG was injected intravenously. Full-ring PET imaging was performed from the skull base to thigh after the radiotracer. CT data was obtained and used for attenuation correction and anatomic localization. FASTING BLOOD GLUCOSE:  Value: 98 mg/dl COMPARISON:  CT scan of the chest January 27, 2017. CT scan of the abdomen and pelvis January 26, 2017. FINDINGS: NECK: There is increased uptake in a lymph node just deep to the right submandibular gland on series 4, image 23. The lymph node measures 10 mm in short axis with a maximum  SUV of 11.3. Uptake in a left cervical facet on image 29 is consistent with degenerative change. Mild uptake in the left parotid gland on image 20 is likely either a node within the parotid or benign lesion with low level uptake and a maximum SUV of 3.8. No other suspicious uptake in the head or neck regions. CHEST: The mass in the right upper lobe measures 3.2 by 2.6 cm on series 8, image 23, not changed in the interval with a maximum SUV of 13.5, consistent with malignancy. FDG avid metastatic nodes are seen in the right paratracheal region on image 62, to the right of the carina on image 69, and in the right hilum on image 76. The right hilar node measures 3 cm with a maximum SUV of 30. No other FDG avid disease in the chest. ABDOMEN/PELVIS: The patient has known right adrenal nodule is again identified measuring up to 3.2 cm, unchanged. On precontrast images, the posterior aspect of the nodule demonstrates an attenuation of less than 0 Hounsfield units. There is low level uptake in the anteromedial aspect of the right adrenal gland on image 118 with a maximum SUV of 3.7 compare 2 background hepatic activity of 4.0 as measured on image 110. The majority of the right adrenal gland demonstrates no uptake. The focal wall thickening and surrounding fat stranding associated with the sigmoid colon seen on series 4, images 163 173 has improved in the interval. The suspected small extraluminal gas collection adjacent the inflamed sigmoid colon persists but is smaller in the interval. There is focal FDG uptake in this region with a maximum SUV of 15.3. No other abnormal uptake in the soft tissues of the abdomen. Mild uptake adjacent to the left ischium is likely degenerative. No other abnormal uptake in the pelvis. SKELETON: No focal hypermetabolic activity to suggest skeletal metastasis. IMPRESSION: 1. The patient's right upper lobe pulmonary mass is FDG avid consistent with a primary malignancy. There is FDG avid  metastatic disease to a right level 2 cervical lymph node, to right mediastinal nodes, and a right hilar node. 2. 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 Recommend attention on follow-up. 3. The region of focal sigmoid thickening and adjacent fat stranding with a suspected small extraluminal gas collection likely represents continued sequela of diverticulitis given the recent CT scan. Recommend follow-up to complete resolution of the patient's symptoms and imaging findings. A colonoscopy can of course better evaluate the underlying colon if clinically warranted. Electronically Signed   By: Dorise Bullion III M.D   On: 02/16/2017 07:40   US Venous Img Lower Unilateral Right  Result Date: 02/18/2017 CLINICAL DATA:  Right lower extremity pain with palpable mass. Recent diagnosis of right upper lobe lung mass with abnormal metabolic activity by PET scan and evidence of metastatic disease to right cervical lymph node, mediastinal nodes and a right hilar node. EXAM: RIGHT LOWER EXTREMITY VENOUS DOPPLER ULTRASOUND TECHNIQUE: Gray-scale sonography with graded compression, as well as color Doppler and duplex ultrasound were performed to evaluate the lower extremity deep venous systems from the level of the common femoral vein and including the common femoral, femoral, profunda femoral, popliteal and calf veins including the posterior tibial, peroneal and gastrocnemius veins when visible. The superficial great saphenous vein was also interrogated. Spectral Doppler was utilized to evaluate flow at rest and with distal augmentation maneuvers in the common femoral, femoral and popliteal veins. COMPARISON:  None. FINDINGS: Contralateral Common Femoral Vein: Respiratory phasicity is normal and symmetric  with the symptomatic side. No evidence of thrombus. Normal compressibility. Common Femoral Vein: No evidence of thrombus. Normal compressibility, respiratory phasicity and response to augmentation. Saphenofemoral Junction: No evidence of thrombus. Normal compressibility and flow on color Doppler imaging. Profunda Femoral Vein: No evidence of thrombus. Normal compressibility and flow on color Doppler imaging. Femoral Vein: No evidence of thrombus. Normal compressibility, respiratory phasicity and response to augmentation. Popliteal Vein: No evidence of thrombus. Normal compressibility, respiratory phasicity and response to augmentation. Calf Veins: No evidence of thrombus. Normal compressibility and flow on color Doppler imaging. Superficial Great Saphenous Vein: No evidence of thrombus. Normal compressibility. Venous Reflux:  None. Other Findings: No evidence of superficial thrombophlebitis. There is a discrete abnormal soft tissue mass in the mid thigh measuring up to 7.8 x 3.6 x 5.3 cm. This is concerning for a soft tissue metastasis given the presence of a lung mass and lymph node metastatic disease in the chest and neck by PET scan. IMPRESSION: 1. No evidence of right lower extremity DVT. 2. Soft tissue mass of the right mid thigh measuring nearly 8 cm in greatest diameter and appearing likely malignant by ultrasound. This is concerning for a soft tissue metastasis from lung carcinoma based on recent PET scan findings. Consider biopsy of this mass versus the right cervical lymph node metastasis for tissue diagnosis. Electronically Signed   By: Aletta Edouard M.D.   On: 02/18/2017 16:35   [place] Core Biopsy (lymph Nodes)  Result Date: 02/22/2017 INDICATION: 66 year old with right lung lesion and concern for metastatic lung cancer. FDG positive lymph node in the right upper neck. EXAM: ULTRASOUND GUIDED FINE NEEDLE ASPIRATION OF RIGHT CERVICAL LYMPH NODE MEDICATIONS: None. ANESTHESIA/SEDATION: Fentanyl 100 mcg  IV; Versed 3.0 mg IV Moderate Sedation Time:  20 minutes The patient was continuously monitored during the procedure by the interventional radiology nurse under my direct supervision. PROCEDURE: The procedure was explained to the patient. The risks and benefits of the procedure were discussed and the patient's questions were addressed. Informed  consent was obtained from the patient. Right side of the neck was evaluated with ultrasound. Hypoechoic lymph node posterior to the right parotid gland was identified and targeted. The overlying skin was prepped with chlorhexidine. A sterile field was created. Skin was anesthetized with 1% lidocaine. Using ultrasound guidance, 7 fine-needle aspirations were performed in this hypoechoic lymph node with 25 gauge needles. Bandage placed over the puncture site. COMPLICATIONS: None immediate. FINDINGS: Hypoechoic lymph node in the right upper neck, just posterior to the right parotid gland. No significant bleeding or hematoma formation following the fine-needle aspirations. IMPRESSION: Ultrasound-guided fine-needle aspirations of a right cervical lymph node. Electronically Signed   By: Markus Daft M.D.   On: 02/22/2017 15:17   US Biopsy (abdominal Retropertioneal Mass)  Result Date: 02/22/2017 INDICATION: 66 year old with a right lung lesion and indeterminate right thigh soft tissue lesion. EXAM: ULTRASOUND-GUIDED CORE BIOPSY OF RIGHT THIGH SOFT TISSUE MASS MEDICATIONS: None. ANESTHESIA/SEDATION: Moderate sedation was given for the right neck biopsy which was immediately prior to this procedure. FLUOROSCOPY TIME:  None COMPLICATIONS: None immediate. PROCEDURE: Informed written consent was obtained from the patient after a thorough discussion of the procedural risks, benefits and alternatives. All questions were addressed. A timeout was performed prior to the initiation of the procedure. Ultrasound demonstrated a hypoechoic mass along the medial right thigh. The overlying skin  was prepped with chlorhexidine and sterile field was created. Skin and soft tissues were anesthetized with 1% lidocaine. 17 gauge coaxial needle was directed into the lesion with ultrasound guidance. Total of 4 core biopsies were obtained with 18 gauge core device. Specimens placed in formalin. 17 gauge needle was removed without complication. Bandage placed over the puncture site. FINDINGS: Large oval shaped hypoechoic mass in the medial right thigh. Four core biopsies obtained from the lesion. No significant bleeding or hematoma formation following the core biopsies. IMPRESSION: Successful ultrasound-guided core biopsies of the right thigh mass. Electronically Signed   By: Markus Daft M.D.   On: 02/22/2017 17:15        This case was discussed with Dr. Julien Nordmann. He expressed agreement with my management of this patient.

## 2017-03-14 ENCOUNTER — Telehealth: Payer: Self-pay | Admitting: Medical Oncology

## 2017-03-14 ENCOUNTER — Other Ambulatory Visit: Payer: Self-pay | Admitting: *Deleted

## 2017-03-14 DIAGNOSIS — Z006 Encounter for examination for normal comparison and control in clinical research program: Secondary | ICD-10-CM

## 2017-03-14 NOTE — Telephone Encounter (Signed)
Pt doing well after taxol,carbo ,Aaron Sellers. His bones ache some and I told him it may be related to [facility].Insturcted to call for any concerns or problems.

## 2017-03-17 ENCOUNTER — Telehealth: Payer: Self-pay | Admitting: Oncology

## 2017-03-17 ENCOUNTER — Telehealth: Payer: Self-pay | Admitting: Medical

## 2017-03-17 NOTE — Telephone Encounter (Signed)
All appointments are scheduled through 2/12 - per 12/11 los.

## 2017-03-17 NOTE — Telephone Encounter (Signed)
No 12/11 los

## 2017-03-18 ENCOUNTER — Ambulatory Visit
Admission: RE | Admit: 2017-03-18 | Discharge: 2017-03-18 | Disposition: A | Discharge status: Home / Self Care | Payer: Managed Care, Other (non HMO) | Source: Ambulatory Visit | Attending: Radiation Oncology | Admitting: Radiation Oncology

## 2017-03-18 DIAGNOSIS — Z510 Encounter for antineoplastic radiation therapy: Secondary | ICD-10-CM | POA: Diagnosis not present

## 2017-03-19 ENCOUNTER — Encounter: Payer: Self-pay | Admitting: Internal Medicine

## 2017-03-19 ENCOUNTER — Other Ambulatory Visit (HOSPITAL_BASED_OUTPATIENT_CLINIC_OR_DEPARTMENT_OTHER): Payer: Managed Care, Other (non HMO)

## 2017-03-19 ENCOUNTER — Ambulatory Visit
Admission: RE | Admit: 2017-03-19 | Discharge: 2017-03-19 | Disposition: A | Discharge status: Home / Self Care | Payer: Managed Care, Other (non HMO) | Source: Ambulatory Visit | Attending: Radiation Oncology | Admitting: Radiation Oncology

## 2017-03-19 DIAGNOSIS — C3491 Malignant neoplasm of unspecified part of right bronchus or lung: Secondary | ICD-10-CM

## 2017-03-19 DIAGNOSIS — Z006 Encounter for examination for normal comparison and control in clinical research program: Secondary | ICD-10-CM

## 2017-03-19 DIAGNOSIS — C3411 Malignant neoplasm of upper lobe, right bronchus or lung: Secondary | ICD-10-CM | POA: Diagnosis not present

## 2017-03-19 DIAGNOSIS — Z510 Encounter for antineoplastic radiation therapy: Secondary | ICD-10-CM | POA: Diagnosis not present

## 2017-03-19 LAB — CBC WITH DIFFERENTIAL/PLATELET
BASO%: 0.8 % (ref 0.0–2.0)
BASOS ABS: 0.1 10*3/uL (ref 0.0–0.1)
EOS%: 3.6 % (ref 0.0–7.0)
Eosinophils Absolute: 0.2 10*3/uL (ref 0.0–0.5)
HEMATOCRIT: 45.1 % (ref 38.4–49.9)
HGB: 14.8 g/dL (ref 13.0–17.1)
LYMPH%: 27 % (ref 14.0–49.0)
MCH: 27.8 pg (ref 27.2–33.4)
MCHC: 32.9 g/dL (ref 32.0–36.0)
MCV: 84.5 fL (ref 79.3–98.0)
MONO#: 1 10*3/uL — AB (ref 0.1–0.9)
MONO%: 14.5 % — ABNORMAL HIGH (ref 0.0–14.0)
NEUT#: 3.6 10*3/uL (ref 1.5–6.5)
NEUT%: 54.1 % (ref 39.0–75.0)
PLATELETS: 177 10*3/uL (ref 140–400)
RBC: 5.34 10*6/uL (ref 4.20–5.82)
RDW: 13.8 % (ref 11.0–14.6)
WBC: 6.7 10*3/uL (ref 4.0–10.3)
lymph#: 1.8 10*3/uL (ref 0.9–3.3)

## 2017-03-19 LAB — COMPREHENSIVE METABOLIC PANEL
ALK PHOS: 106 U/L (ref 40–150)
ALT: 22 U/L (ref 0–55)
ANION GAP: 8 meq/L (ref 3–11)
AST: 14 U/L (ref 5–34)
Albumin: 3.5 g/dL (ref 3.5–5.0)
BILIRUBIN TOTAL: 0.78 mg/dL (ref 0.20–1.20)
BUN: 17.9 mg/dL (ref 7.0–26.0)
CALCIUM: 9.1 mg/dL (ref 8.4–10.4)
CO2: 27 mEq/L (ref 22–29)
Chloride: 102 mEq/L (ref 98–109)
Creatinine: 0.9 mg/dL (ref 0.7–1.3)
EGFR: >60 mL/min/{1.73_m2} (ref >60)
GLUCOSE: 195 mg/dL — AB (ref 70–140)
Potassium: 3.8 mEq/L (ref 3.5–5.1)
Sodium: 137 mEq/L (ref 136–145)
TOTAL PROTEIN: 6.8 g/dL (ref 6.4–8.3)

## 2017-03-19 LAB — RESEARCH LABS

## 2017-03-19 NOTE — Progress Notes (Signed)
Met with patient and sgo to introduce myself as Arboriculturist and to discuss available assistance. Asked patient if his insurance renews in January and if he will still be taking Neulasta. Patient states yes and he will. Advised him of the [employer] First Step copay program for Neulasta which will cover the first injection at 100% and each injection after will only leave a $5 copay. They are interested in applying. Enrolled online and patient was approved. Advised there is nothing they need to do and I will monitor on my end.  Asked patient about current income. He states he has been out on STD but still employed and has not received first check since October for disability. Approved patient for one-time $40 [place] to assist with gas cards,medications and any other personal expenses listed on the sheet. Patient has a copy of the approval letter as well as the expense sheet along with the outpatient pharmacy information. Patient very appreciative. I offered gas card today and they accepted. They have my card for any additional financial questions or concerns.

## 2017-03-19 NOTE — Progress Notes (Signed)
Also gave Duanne Limerick application for assistance for patients whom live in [facility] and advised what needed to be submitted along with application.

## 2017-03-20 ENCOUNTER — Ambulatory Visit
Admission: RE | Admit: 2017-03-20 | Discharge: 2017-03-20 | Disposition: A | Discharge status: Home / Self Care | Payer: Managed Care, Other (non HMO) | Source: Ambulatory Visit | Attending: Radiation Oncology | Admitting: Radiation Oncology

## 2017-03-20 DIAGNOSIS — Z510 Encounter for antineoplastic radiation therapy: Secondary | ICD-10-CM | POA: Diagnosis not present

## 2017-03-21 ENCOUNTER — Ambulatory Visit
Admission: RE | Admit: 2017-03-21 | Discharge: 2017-03-21 | Disposition: A | Discharge status: Home / Self Care | Payer: Managed Care, Other (non HMO) | Source: Ambulatory Visit | Attending: Radiation Oncology | Admitting: Radiation Oncology

## 2017-03-21 DIAGNOSIS — Z510 Encounter for antineoplastic radiation therapy: Secondary | ICD-10-CM | POA: Diagnosis not present

## 2017-03-22 ENCOUNTER — Ambulatory Visit
Admission: RE | Admit: 2017-03-22 | Discharge: 2017-03-22 | Disposition: A | Discharge status: Home / Self Care | Payer: Managed Care, Other (non HMO) | Source: Ambulatory Visit | Attending: Radiation Oncology | Admitting: Radiation Oncology

## 2017-03-22 DIAGNOSIS — Z510 Encounter for antineoplastic radiation therapy: Secondary | ICD-10-CM | POA: Diagnosis not present

## 2017-03-22 NOTE — Progress Notes (Signed)
Pt here for patient teaching.  Pt given Radiation and You booklet, skin care instructions and Sonafine.  Reviewed areas of pertinence such as fatigue and skin changes . Pt able to give teach back of ,apply Sonafine bid and avoid applying anything to skin within 4 hours of treatment. Pt verbalizes understanding of information given and will contact nursing with any questions or concerns.   Patient was not given cream today because he stated that someone had already given him some.  Http://rtanswers.org/treatmentinformation/whattoexpect/index

## 2017-03-25 ENCOUNTER — Ambulatory Visit
Admission: RE | Admit: 2017-03-25 | Discharge: 2017-03-25 | Disposition: A | Discharge status: Home / Self Care | Payer: Managed Care, Other (non HMO) | Source: Ambulatory Visit | Attending: Radiation Oncology | Admitting: Radiation Oncology

## 2017-03-25 ENCOUNTER — Telehealth: Payer: Self-pay | Admitting: *Deleted

## 2017-03-25 ENCOUNTER — Encounter: Payer: Self-pay | Admitting: Radiation Oncology

## 2017-03-25 ENCOUNTER — Telehealth: Payer: Self-pay | Admitting: Radiation Oncology

## 2017-03-25 DIAGNOSIS — Z510 Encounter for antineoplastic radiation therapy: Secondary | ICD-10-CM | POA: Diagnosis not present

## 2017-03-25 MED ORDER — TRAMADOL HCL 50 MG PO TABS: 50.0000 mg | ORAL_TABLET | Freq: Four times a day (QID) | ORAL | 0 refills | Status: DC | PRN

## 2017-03-25 NOTE — Telephone Encounter (Signed)
[place] in [place], spoke with pharmacist Gerald Stabs, "Gabriel Cirri they have tramadol rx waiting for patient to pick up, thanked Gerald Stabs 3:19 PM

## 2017-03-25 NOTE — Telephone Encounter (Signed)
Returned voice message to wife, patient having very dark orange urine and c/o pain radiating  from his thigh to lower back, he just too 2 advil, asked to be seen today, can see patient after his treament with Alisbn ,our PA, MD in another office today No dysuria no fever, no blood in urine stated,wiwfe thanked this Rn for returning her call so soon 12:18 PM

## 2017-03-25 NOTE — Telephone Encounter (Signed)
I spoke with the patient and he is having increased thigh pain he will finish treatment on 04/09/16. I discussed options for narcotic pain medication until his radiotherapy improves his symptoms and he is in agreement. Tramadol is called in and we discussed the risks, benefits, and cross reactivity of codeine products. He will try Tramadol and if he has any "skin crawling" symptoms, he will also take benadryl if this occurs. He did not have shortness of breath or breathing difficulties previously with codeine based products.

## 2017-03-27 ENCOUNTER — Other Ambulatory Visit (HOSPITAL_BASED_OUTPATIENT_CLINIC_OR_DEPARTMENT_OTHER): Payer: Managed Care, Other (non HMO)

## 2017-03-27 ENCOUNTER — Ambulatory Visit
Admission: RE | Admit: 2017-03-27 | Discharge: 2017-03-27 | Disposition: A | Discharge status: Home / Self Care | Payer: Managed Care, Other (non HMO) | Source: Ambulatory Visit | Attending: Radiation Oncology | Admitting: Radiation Oncology

## 2017-03-27 DIAGNOSIS — Z510 Encounter for antineoplastic radiation therapy: Secondary | ICD-10-CM | POA: Diagnosis not present

## 2017-03-27 DIAGNOSIS — C3491 Malignant neoplasm of unspecified part of right bronchus or lung: Secondary | ICD-10-CM

## 2017-03-27 DIAGNOSIS — C3411 Malignant neoplasm of upper lobe, right bronchus or lung: Secondary | ICD-10-CM | POA: Diagnosis not present

## 2017-03-27 DIAGNOSIS — Z79899 Other long term (current) drug therapy: Secondary | ICD-10-CM

## 2017-03-27 DIAGNOSIS — R5382 Chronic fatigue, unspecified: Secondary | ICD-10-CM

## 2017-03-27 LAB — CBC WITH DIFFERENTIAL/PLATELET
BASO%: 1 % (ref 0.0–2.0)
BASOS ABS: 0.1 10*3/uL (ref 0.0–0.1)
EOS ABS: 0.1 10*3/uL (ref 0.0–0.5)
EOS%: 1.1 % (ref 0.0–7.0)
HCT: 38.4 % (ref 38.4–49.9)
HEMOGLOBIN: 12.5 g/dL — AB (ref 13.0–17.1)
LYMPH%: 14.4 % (ref 14.0–49.0)
MCH: 27.5 pg (ref 27.2–33.4)
MCHC: 32.6 g/dL (ref 32.0–36.0)
MCV: 84.4 fL (ref 79.3–98.0)
MONO#: 0.7 10*3/uL (ref 0.1–0.9)
MONO%: 5.8 % (ref 0.0–14.0)
NEUT#: 8.8 10*3/uL — ABNORMAL HIGH (ref 1.5–6.5)
NEUT%: 77.7 % — ABNORMAL HIGH (ref 39.0–75.0)
PLATELETS: 171 10*3/uL (ref 140–400)
RBC: 4.56 10*6/uL (ref 4.20–5.82)
RDW: 14.1 % (ref 11.0–14.6)
WBC: 11.4 10*3/uL — ABNORMAL HIGH (ref 4.0–10.3)
lymph#: 1.6 10*3/uL (ref 0.9–3.3)

## 2017-03-27 LAB — COMPREHENSIVE METABOLIC PANEL
ALT: 17 U/L (ref 0–55)
AST: 12 U/L (ref 5–34)
Albumin: 3.2 g/dL — ABNORMAL LOW (ref 3.5–5.0)
Alkaline Phosphatase: 110 U/L (ref 40–150)
Anion Gap: 7 mEq/L (ref 3–11)
BUN: 12 mg/dL (ref 7.0–26.0)
CHLORIDE: 105 meq/L (ref 98–109)
CO2: 26 meq/L (ref 22–29)
CREATININE: 0.8 mg/dL (ref 0.7–1.3)
Calcium: 9 mg/dL (ref 8.4–10.4)
EGFR: >60 mL/min/{1.73_m2} (ref >60)
Glucose: 125 mg/dl (ref 70–140)
Potassium: 3.9 mEq/L (ref 3.5–5.1)
Sodium: 138 mEq/L (ref 136–145)
Total Bilirubin: 0.44 mg/dL (ref 0.20–1.20)
Total Protein: 6.4 g/dL (ref 6.4–8.3)

## 2017-03-27 LAB — TSH: TSH: 1.591 m[IU]/L (ref 0.320–4.118)

## 2017-03-28 ENCOUNTER — Ambulatory Visit
Admission: RE | Admit: 2017-03-28 | Discharge: 2017-03-28 | Disposition: A | Discharge status: Home / Self Care | Payer: Managed Care, Other (non HMO) | Source: Ambulatory Visit | Attending: Radiation Oncology | Admitting: Radiation Oncology

## 2017-03-28 DIAGNOSIS — Z510 Encounter for antineoplastic radiation therapy: Secondary | ICD-10-CM | POA: Diagnosis not present

## 2017-03-29 ENCOUNTER — Ambulatory Visit
Admission: RE | Admit: 2017-03-29 | Discharge: 2017-03-29 | Disposition: A | Discharge status: Home / Self Care | Payer: Managed Care, Other (non HMO) | Source: Ambulatory Visit | Attending: Radiation Oncology | Admitting: Radiation Oncology

## 2017-03-29 DIAGNOSIS — Z510 Encounter for antineoplastic radiation therapy: Secondary | ICD-10-CM | POA: Diagnosis not present

## 2017-03-30 DIAGNOSIS — C7989 Secondary malignant neoplasm of other specified sites: Secondary | ICD-10-CM | POA: Insufficient documentation

## 2017-03-30 NOTE — Progress Notes (Signed)
  Radiation Oncology         [phone] ________________________________  Name: Aaron Sellers MRN: [id number]  Date: 03/07/2017  DOB: 02-23-1951  SIMULATION AND TREATMENT PLANNING NOTE  DIAGNOSIS:     ICD-10-CM   1. 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  Identity was confirmed.  All relevant records and images related to the planned course of therapy were reviewed.   Written consent to proceed with treatment was confirmed which was freely given after reviewing the details related to the planned course of therapy had been reviewed with the patient.  Then, the patient was set-up in a stable reproducible  supine position for radiation therapy.  CT images were obtained.  Surface markings were placed.    Medically necessary complex treatment device(s) for immobilization:  Customized vac lock bag.   The CT images were loaded into the planning software.  Then the target and avoidance structures were contoured.  Treatment planning then occurred.  The radiation prescription was entered and confirmed.  A total of 3 complex treatment devices were fabricated which relate to the designed radiation treatment fields. Each of these customized fields/ complex treatment devices will be used on a daily basis during the radiation course. I have requested : Isodose Plan.   PLAN:  The patient will receive 37.5 Gy in 15 fractions.   Special treatment procedure The patient will also receive concurrent chemotherapy during the treatment. The patient may therefore experience increased toxicity or side effects and the patient will be monitored for such problems. This may require extra lab work as necessary. This therefore constitutes a special treatment procedure.   ________________________________   Jodelle Gross, MD, PhD

## 2017-04-01 ENCOUNTER — Ambulatory Visit
Admission: RE | Admit: 2017-04-01 | Discharge: 2017-04-01 | Disposition: A | Discharge status: Home / Self Care | Payer: Managed Care, Other (non HMO) | Source: Ambulatory Visit | Attending: Radiation Oncology | Admitting: Radiation Oncology

## 2017-04-01 DIAGNOSIS — Z510 Encounter for antineoplastic radiation therapy: Secondary | ICD-10-CM | POA: Diagnosis not present

## 2017-04-03 ENCOUNTER — Encounter: Payer: Self-pay | Admitting: Oncology

## 2017-04-03 ENCOUNTER — Ambulatory Visit
Admission: RE | Admit: 2017-04-03 | Discharge: 2017-04-03 | Disposition: A | Discharge status: Home / Self Care | Payer: Managed Care, Other (non HMO) | Source: Ambulatory Visit | Attending: Radiation Oncology | Admitting: Radiation Oncology

## 2017-04-03 ENCOUNTER — Other Ambulatory Visit (HOSPITAL_BASED_OUTPATIENT_CLINIC_OR_DEPARTMENT_OTHER): Payer: Managed Care, Other (non HMO)

## 2017-04-03 ENCOUNTER — Ambulatory Visit (HOSPITAL_BASED_OUTPATIENT_CLINIC_OR_DEPARTMENT_OTHER): Payer: Managed Care, Other (non HMO)

## 2017-04-03 ENCOUNTER — Inpatient Hospital Stay: Payer: Managed Care, Other (non HMO) | Attending: Oncology | Admitting: Oncology

## 2017-04-03 VITALS — BP 125/72 | HR 61 | Temp 98.4°F | Resp 17 | Ht 79.0 in | Wt 293.6 lb

## 2017-04-03 VITALS — BP 138/82 | HR 59 | Temp 97.5°F | Resp 18

## 2017-04-03 DIAGNOSIS — Z5189 Encounter for other specified aftercare: Secondary | ICD-10-CM | POA: Insufficient documentation

## 2017-04-03 DIAGNOSIS — E7800 Pure hypercholesterolemia, unspecified: Secondary | ICD-10-CM | POA: Insufficient documentation

## 2017-04-03 DIAGNOSIS — Z5111 Encounter for antineoplastic chemotherapy: Secondary | ICD-10-CM | POA: Diagnosis not present

## 2017-04-03 DIAGNOSIS — Z510 Encounter for antineoplastic radiation therapy: Secondary | ICD-10-CM | POA: Diagnosis not present

## 2017-04-03 DIAGNOSIS — J449 Chronic obstructive pulmonary disease, unspecified: Secondary | ICD-10-CM | POA: Insufficient documentation

## 2017-04-03 DIAGNOSIS — Z79899 Other long term (current) drug therapy: Secondary | ICD-10-CM | POA: Diagnosis not present

## 2017-04-03 DIAGNOSIS — Z885 Allergy status to narcotic agent status: Secondary | ICD-10-CM | POA: Insufficient documentation

## 2017-04-03 DIAGNOSIS — I498 Other specified cardiac arrhythmias: Secondary | ICD-10-CM | POA: Insufficient documentation

## 2017-04-03 DIAGNOSIS — Z5112 Encounter for antineoplastic immunotherapy: Secondary | ICD-10-CM | POA: Insufficient documentation

## 2017-04-03 DIAGNOSIS — C7989 Secondary malignant neoplasm of other specified sites: Secondary | ICD-10-CM

## 2017-04-03 DIAGNOSIS — I4892 Unspecified atrial flutter: Secondary | ICD-10-CM | POA: Insufficient documentation

## 2017-04-03 DIAGNOSIS — C3491 Malignant neoplasm of unspecified part of right bronchus or lung: Secondary | ICD-10-CM

## 2017-04-03 DIAGNOSIS — Z87442 Personal history of urinary calculi: Secondary | ICD-10-CM | POA: Insufficient documentation

## 2017-04-03 DIAGNOSIS — C7951 Secondary malignant neoplasm of bone: Secondary | ICD-10-CM | POA: Insufficient documentation

## 2017-04-03 DIAGNOSIS — R52 Pain, unspecified: Secondary | ICD-10-CM

## 2017-04-03 DIAGNOSIS — C3411 Malignant neoplasm of upper lobe, right bronchus or lung: Secondary | ICD-10-CM

## 2017-04-03 DIAGNOSIS — Z881 Allergy status to other antibiotic agents status: Secondary | ICD-10-CM | POA: Insufficient documentation

## 2017-04-03 DIAGNOSIS — N189 Chronic kidney disease, unspecified: Secondary | ICD-10-CM | POA: Insufficient documentation

## 2017-04-03 DIAGNOSIS — M1990 Unspecified osteoarthritis, unspecified site: Secondary | ICD-10-CM | POA: Insufficient documentation

## 2017-04-03 DIAGNOSIS — I129 Hypertensive chronic kidney disease with stage 1 through stage 4 chronic kidney disease, or unspecified chronic kidney disease: Secondary | ICD-10-CM | POA: Insufficient documentation

## 2017-04-03 DIAGNOSIS — R300 Dysuria: Secondary | ICD-10-CM | POA: Insufficient documentation

## 2017-04-03 DIAGNOSIS — R590 Localized enlarged lymph nodes: Secondary | ICD-10-CM | POA: Insufficient documentation

## 2017-04-03 LAB — CBC WITH DIFFERENTIAL/PLATELET
BASO%: 1.3 % (ref 0.0–2.0)
Basophils Absolute: 0.1 10*3/uL (ref 0.0–0.1)
EOS ABS: 0.2 10*3/uL (ref 0.0–0.5)
EOS%: 1.5 % (ref 0.0–7.0)
HCT: 40.7 % (ref 38.4–49.9)
HGB: 13.5 g/dL (ref 13.0–17.1)
LYMPH%: 17.6 % (ref 14.0–49.0)
MCH: 28.1 pg (ref 27.2–33.4)
MCHC: 33.1 g/dL (ref 32.0–36.0)
MCV: 84.9 fL (ref 79.3–98.0)
MONO#: 1.3 10*3/uL — ABNORMAL HIGH (ref 0.1–0.9)
MONO%: 11.5 % (ref 0.0–14.0)
NEUT%: 68.1 % (ref 39.0–75.0)
NEUTROS ABS: 7.5 10*3/uL — AB (ref 1.5–6.5)
PLATELETS: 261 10*3/uL (ref 140–400)
RBC: 4.8 10*6/uL (ref 4.20–5.82)
RDW: 14.6 % (ref 11.0–14.6)
WBC: 11 10*3/uL — AB (ref 4.0–10.3)
lymph#: 1.9 10*3/uL (ref 0.9–3.3)

## 2017-04-03 LAB — COMPREHENSIVE METABOLIC PANEL
ALT: 21 U/L (ref 0–55)
AST: 14 U/L (ref 5–34)
Albumin: 3.6 g/dL (ref 3.5–5.0)
Alkaline Phosphatase: 97 U/L (ref 40–150)
Anion Gap: 7 mEq/L (ref 3–11)
BUN: 15.5 mg/dL (ref 7.0–26.0)
CALCIUM: 9.4 mg/dL (ref 8.4–10.4)
CHLORIDE: 103 meq/L (ref 98–109)
CO2: 27 mEq/L (ref 22–29)
Creatinine: 0.8 mg/dL (ref 0.7–1.3)
EGFR: >60 mL/min/{1.73_m2} (ref >60)
GLUCOSE: 93 mg/dL (ref 70–140)
Potassium: 4.1 mEq/L (ref 3.5–5.1)
SODIUM: 137 meq/L (ref 136–145)
Total Bilirubin: 0.67 mg/dL (ref 0.20–1.20)
Total Protein: 7 g/dL (ref 6.4–8.3)

## 2017-04-03 LAB — URINALYSIS, MICROSCOPIC - CHCC
Bilirubin (Urine): NEGATIVE
Glucose: NEGATIVE mg/dL
Ketones: NEGATIVE mg/dL
LEUKOCYTE ESTERASE: NEGATIVE
Nitrite: NEGATIVE
PH: 7 (ref 4.6–8.0)
Protein: NEGATIVE mg/dL
Specific Gravity, Urine: 1.01 (ref 1.003–1.035)
UROBILINOGEN UR: 0.2 mg/dL (ref 0.2–1)

## 2017-04-03 MED ORDER — SODIUM CHLORIDE 0.9 % IV SOLN
750.0000 mg | Freq: Once | INTRAVENOUS | Status: AC
  Administered 2017-04-03: 750 mg via INTRAVENOUS
  Filled 2017-04-03: qty 75

## 2017-04-03 MED ORDER — PALONOSETRON HCL INJECTION 0.25 MG/5ML
INTRAVENOUS | Status: AC
  Filled 2017-04-03: qty 5

## 2017-04-03 MED ORDER — SODIUM CHLORIDE 0.9 % IV SOLN: Freq: Once | INTRAVENOUS | Status: AC

## 2017-04-03 MED ORDER — DIPHENHYDRAMINE HCL 50 MG/ML IJ SOLN
50.0000 mg | Freq: Once | INTRAMUSCULAR | Status: AC
  Administered 2017-04-03: 50 mg via INTRAVENOUS

## 2017-04-03 MED ORDER — SODIUM CHLORIDE 0.9 % IV SOLN
175.0000 mg/m2 | Freq: Once | INTRAVENOUS | Status: AC
  Administered 2017-04-03: 474 mg via INTRAVENOUS
  Filled 2017-04-03: qty 79

## 2017-04-03 MED ORDER — SODIUM CHLORIDE 0.9 % IV SOLN
200.0000 mg | Freq: Once | INTRAVENOUS | Status: AC
  Administered 2017-04-03: 200 mg via INTRAVENOUS
  Filled 2017-04-03: qty 8

## 2017-04-03 MED ORDER — PALONOSETRON HCL INJECTION 0.25 MG/5ML
0.2500 mg | Freq: Once | INTRAVENOUS | Status: AC
  Administered 2017-04-03: 0.25 mg via INTRAVENOUS

## 2017-04-03 MED ORDER — SODIUM CHLORIDE 0.9 % IV SOLN
Freq: Once | INTRAVENOUS | Status: AC
  Administered 2017-04-03: 13:00:00 via INTRAVENOUS

## 2017-04-03 MED ORDER — FAMOTIDINE IN NACL 20-0.9 MG/50ML-% IV SOLN
20.0000 mg | Freq: Once | INTRAVENOUS | Status: AC
  Administered 2017-04-03: 20 mg via INTRAVENOUS

## 2017-04-03 MED ORDER — FAMOTIDINE IN NACL 20-0.9 MG/50ML-% IV SOLN
INTRAVENOUS | Status: AC
  Filled 2017-04-03: qty 50

## 2017-04-03 MED ORDER — DIPHENHYDRAMINE HCL 50 MG/ML IJ SOLN
INTRAMUSCULAR | Status: AC
  Filled 2017-04-03: qty 1

## 2017-04-03 MED ORDER — SODIUM CHLORIDE 0.9 % IV SOLN
20.0000 mg | Freq: Once | INTRAVENOUS | Status: AC
  Administered 2017-04-03: 20 mg via INTRAVENOUS
  Filled 2017-04-03: qty 2

## 2017-04-03 MED ORDER — PEGFILGRASTIM 6 MG/0.6ML ~~LOC~~ PSKT: 6.0000 mg | PREFILLED_SYRINGE | Freq: Once | SUBCUTANEOUS | Status: DC

## 2017-04-03 NOTE — Patient Instructions (Signed)
[facility] Discharge Instructions for Patients Receiving Chemotherapy  Today you received the following chemotherapy agents: Keytruda, Taxol, Carboplatin  To help prevent nausea and vomiting after your treatment, we encourage you to take your nausea medication as directed.   If you develop nausea and vomiting that is not controlled by your nausea medication, call the clinic.   BELOW ARE SYMPTOMS THAT SHOULD BE REPORTED IMMEDIATELY:  *FEVER GREATER THAN 100.5 F  *CHILLS WITH OR WITHOUT FEVER  NAUSEA AND VOMITING THAT IS NOT CONTROLLED WITH YOUR NAUSEA MEDICATION  *UNUSUAL SHORTNESS OF BREATH  *UNUSUAL BRUISING OR BLEEDING  TENDERNESS IN MOUTH AND THROAT WITH OR WITHOUT PRESENCE OF ULCERS  *URINARY PROBLEMS  *BOWEL PROBLEMS  UNUSUAL RASH Items with * indicate a potential emergency and should be followed up as soon as possible.  Feel free to call the clinic should you have any questions or concerns. The clinic phone number is [phone].  Please show the CHEMO ALERT CARD at check-in to the Emergency Department and triage nurse.   

## 2017-04-03 NOTE — Progress Notes (Signed)
[place] OFFICE PROGRESS NOTE  Sheryle Hail, PA-C [address] [place] [address]  DIAGNOSIS: Stage IV (T2a,N3,M1c)non-small cell lung cancer, squamous cell carcinoma presented with right upper lobe lung mass in addition to right hilar and mediastinal adenopathy as well as metastatic disease in the medial right thigh diagnosed in November 2018.  PRIOR THERAPY: None  CURRENT THERAPY: Carboplatin for AUC of 5, paclitaxel 175 mg/M2 and Keytruda 200 mg IV every 3 weeks. First dose on 03/12/2017.  Status post 1 cycle.  INTERVAL HISTORY: Aaron Sellers 67 y.o. male returns for routine follow-up visit accompanied by his wife.  The patient is feeling fine today and has no specific complaints.  The patient had an infusion reaction consisting of facial flushing and chest tightness during his Taxol with the first cycle of chemotherapy.  He was given additional premedications and he was able to complete the infusion without difficulty.  He otherwise tolerated his first cycle of chemotherapy well overall.  He denies fevers and chills.  Denies chest pain, shortness of breath, cough, hemoptysis.  Denies nausea, vomiting, constipation, diarrhea.  The patient does notice some increased frequency of urination over the past 2 days.  Denies hematuria and flank pain.  The patient is here for evaluation prior to cycle #2 of his chemotherapy.  MEDICAL HISTORY: Past Medical History:  Diagnosis Date  . Arthritis   . BPH (benign prostatic hypertrophy) with urinary retention   . Chronic kidney disease    hx of kidney stones  2011  . COPD (chronic obstructive pulmonary disease) ([place])    has been smoking since he was 22 ish--  . High cholesterol   . Hypertension    dx around 2011    ALLERGIES:  is allergic to ciprofloxacin; adhesive [tape]; and codeine.  MEDICATIONS:  Current Outpatient Medications  Medication Sig Dispense Refill  . atorvastatin (LIPITOR) 10 MG tablet Take 10 mg  by mouth daily.    Marland Kitchen diltiazem (CARDIZEM CD) 240 MG 24 hr capsule Take 1 capsule (240 mg total) by mouth daily. 30 capsule 2  . diphenhydrAMINE (BENADRYL) 25 MG tablet Take 25 mg every 6 (six) hours as needed by mouth.    . famotidine (PEPCID) 10 MG tablet Take 10 mg by mouth as needed for heartburn or indigestion.    . hydrocortisone 2.5 % cream Apply 1 application topically 2 (two) times daily as needed (skin irritations).     Marland Kitchen ibuprofen (ADVIL,MOTRIN) 200 MG tablet Take 600-800 mg by mouth every 6 (six) hours as needed for mild pain.     Marland Kitchen prochlorperazine (COMPAZINE) 10 MG tablet Take 1 tablet (10 mg total) by mouth every 6 (six) hours as needed for nausea or vomiting. (Patient not taking: Reported on 03/05/2017) 30 tablet 0  . tamsulosin (FLOMAX) 0.4 MG CAPS capsule Take 0.4 mg by mouth daily.    . traMADol (ULTRAM) 50 MG tablet Take 1-2 tablets (50-100 mg total) by mouth every 6 (six) hours as needed. 60 tablet 0   No current facility-administered medications for this visit.    Facility-Administered Medications Ordered in Other Visits  Medication Dose Route Frequency Provider Last Rate Last Dose  . 0.9 %  sodium chloride infusion   Intravenous Once Curt Bears, MD      . 0.9 %  sodium chloride infusion   Intravenous Once Curt Bears, MD      . 0.9 %  sodium chloride infusion   Intravenous Once Curt Bears, MD      .  CARBOplatin (PARAPLATIN) 750 mg in sodium chloride 0.9 % 250 mL chemo infusion  750 mg Intravenous Once Curt Bears, MD      . heparin lock flush 100 unit/mL  500 Units Intracatheter Once PRN Curt Bears, MD      . PACLitaxel (TAXOL) 474 mg in sodium chloride 0.9 % 500 mL chemo infusion (> 36m/m2)  175 mg/m2 (Treatment Plan Recorded) Intravenous Once MCurt Bears MD 48 mL/hr at 04/03/17 1500 474 mg at 04/03/17 1500  . pegfilgrastim (NEULASTA ONPRO KIT) injection 6 mg  6 mg Subcutaneous Once MCurt Bears MD      . sodium chloride flush (NS)  0.9 % injection 10 mL  10 mL Intracatheter PRN MCurt Bears MD      . sodium chloride flush (NS) 0.9 % injection 10 mL  10 mL Intracatheter PRN MCurt Bears MD        SURGICAL HISTORY:  Past Surgical History:  Procedure Laterality Date  . HERNIA REPAIR     umbilical hernia  02/271 . INCISION AND DRAINAGE ABSCESS     "down the middle" of his buttocks  2015  . TOTAL KNEE ARTHROPLASTY Left 10/29/2014   Procedure: TOTAL KNEE ARTHROPLASTY;  Surgeon: JDorna Leitz MD;  [facility]  Service: Orthopedics;  Laterality: Left;    REVIEW OF SYSTEMS:   Review of Systems  Constitutional: Negative for appetite change, chills, fatigue, fever and unexpected weight change.  HENT:   Negative for mouth sores, nosebleeds, sore throat and trouble swallowing.   Eyes: Negative for eye problems and icterus.  Respiratory: Negative for cough, hemoptysis, shortness of breath and wheezing.   Cardiovascular: Negative for chest pain and leg swelling.  Gastrointestinal: Negative for abdominal pain, constipation, diarrhea, nausea and vomiting.  Genitourinary: Negative for bladder incontinence, difficulty urinating, and hematuria.  Positive for increased urinary frequency. Musculoskeletal: Negative for back pain, gait problem, neck pain and neck stiffness.  Skin: Negative for itching and rash.  Neurological: Negative for dizziness, extremity weakness, gait problem, headaches, light-headedness and seizures.  Hematological: Negative for adenopathy. Does not bruise/bleed easily.  Psychiatric/Behavioral: Negative for confusion, depression and sleep disturbance. The patient is not nervous/anxious.     PHYSICAL EXAMINATION:  Blood pressure 125/72, pulse 61, temperature 98.4 F (36.9 C), temperature source Oral, resp. rate 17, height _0  (2.007 m), weight 293 lb 9.6 oz (133.2 kg), SpO2 99 %.  ECOG PERFORMANCE STATUS: 1 - Symptomatic but completely ambulatory  Physical Exam  Constitutional: Oriented to  person, place, and time and well-developed, well-nourished, and in no distress. No distress.  HENT:  Head: Normocephalic and atraumatic.  Mouth/Throat: Oropharynx is clear and moist. No oropharyngeal exudate.  Eyes: Conjunctivae are normal. Right eye exhibits no discharge. Left eye exhibits no discharge. No scleral icterus.  Neck: Normal range of motion. Neck supple.  Cardiovascular: Normal rate, regular rhythm, normal heart sounds and intact distal pulses.   Pulmonary/Chest: Effort normal and breath sounds normal. No respiratory distress. No wheezes. No rales.  Abdominal: Soft. Bowel sounds are normal. Exhibits no distension and no mass. There is no tenderness.  Musculoskeletal: Normal range of motion. Exhibits no edema.  Lymphadenopathy:    No cervical adenopathy.  Neurological: Alert and oriented to person, place, and time. Exhibits normal muscle tone. Gait normal. Coordination normal.  Skin: Skin is warm and dry. No rash noted. Not diaphoretic. No erythema. No pallor.  Psychiatric: Mood, memory and judgment normal.  Vitals reviewed.  LABORATORY DATA: Lab Results  Component Value Date  WBC 11.0 (H) 04/03/2017   HGB 13.5 04/03/2017   HCT 40.7 04/03/2017   MCV 84.9 04/03/2017   PLT 261 04/03/2017      Chemistry      Component Value Date/Time   NA 137 04/03/2017 1030   K 4.1 04/03/2017 1030   CL 102 01/30/2017 0442   CO2 27 04/03/2017 1030   BUN 15.5 04/03/2017 1030   CREATININE 0.8 04/03/2017 1030      Component Value Date/Time   CALCIUM 9.4 04/03/2017 1030   ALKPHOS 97 04/03/2017 1030   AST 14 04/03/2017 1030   ALT 21 04/03/2017 1030   BILITOT 0.67 04/03/2017 1030       RADIOGRAPHIC STUDIES:  Mr Jeri Cos FY Contrast  Result Date: 03/08/2017 CLINICAL DATA:  Squamous cell carcinoma of the right lung.  Staging. EXAM: MRI HEAD WITHOUT AND WITH CONTRAST TECHNIQUE: Multiplanar, multiecho pulse sequences of the brain and surrounding structures were obtained without and  with intravenous contrast. CONTRAST:  2m MULTIHANCE GADOBENATE DIMEGLUMINE 529 MG/ML IV SOLN COMPARISON:  None. FINDINGS: Brain: Diffusion imaging does not show any acute or subacute infarction. There chronic small-vessel ischemic changes of the pons in the cerebral hemispheric white matter, mild to moderate in degree. No cortical or large vessel territory infarction. No evidence of primary or metastatic mass lesion, hemorrhage, hydrocephalus or extra-axial collection. No abnormal contrast enhancement. Vascular: Major vessels at the base of the brain show flow. Skull and upper cervical spine: Negative Sinuses/Orbits: Clear/normal Other: None IMPRESSION: No evidence of metastatic disease. Moderate chronic small-vessel ischemic changes of the white matter. Electronically Signed   By: MNelson ChimesM.D.   On: 03/08/2017 16:01   Ct Femur Right W Contrast  Result Date: 03/08/2017 CLINICAL DATA:  Right thigh metastasis from lung cancer. Assess treatment response. EXAM: CT OF THE LOWER RIGHT EXTREMITY WITH CONTRAST TECHNIQUE: Multidetector CT imaging of the lower right extremity was performed according to the standard protocol following intravenous contrast administration. COMPARISON:  Right lower extremity ultrasound dated February 18, 2017. CONTRAST:  738mISOVUE-300 IOPAMIDOL (ISOVUE-300) INJECTION 61% FINDINGS: Bones/Joint/Cartilage No acute fracture or malalignment. Mild degenerative changes of the right hip and knee. Chondrocalcinosis of the knee. No periosteal reaction or focal bone lesion. Degenerative disc disease at L5-S1. Ligaments Suboptimally assessed by CT. Muscles and Tendons Within the mid right sartorius muscle, there is a heterogeneously enhancing, partially necrotic mass measuring approximately 5.2 x 4.8 x 8.4 cm (AP by transverse by CC). This previously measured 5.3 x 3.6 x 7.8 cm on prior ultrasound. No additional enhancing lesions are identified. Soft tissues No lymphadenopathy. Moderate right  hydrocele. Atherosclerotic vascular calcifications. IMPRESSION: 1. Heterogeneously enhancing, partially necrotic metastasis within the mid right sartorius muscle, slightly increased in size when compared to prior ultrasound, although this may be accounted for by differences in modalities. Electronically Signed   By: WiTitus Dubin.D.   On: 03/08/2017 17:32     ASSESSMENT/PLAN:  Stage IV squamous cell carcinoma of right lung [facility] is a very pleasant [age]ear old white male recently diagnosed with a stage IV (T2a,N3,M1c)non-small cell lung cancer, squamous cell carcinoma presented with right upper lobe lung mass in addition to right hilar and mediastinal adenopathy as well as metastatic disease in the medial right thigh diagnosed in November 2018.  The patient is currently on systemic chemotherapy with carboplatin for an AUC of 5, paclitaxel 175 mg/m, and Keytruda 200 mg IV every 3 weeks.  Status post 1 cycle.  The patient had  an infusion reaction to the Taxol including facial flushing and chest tightness during cycle 1 but otherwise tolerated it well.  The patient was seen with Dr. Julien Nordmann.  We discussed that we will continue the same premedications as before.  We discussed that we will asked nursing to run the Taxol infusion slower in the beginning to try to minimize the risk of reaction.  The patient is willing to proceed.  The patient will have weekly labs while on chemotherapy.  He will return in 3 weeks for evaluation prior to cycle #3.  For his increased urinary frequency, will check a urinalysis and urine culture.  Further treatment pending these results.  The patient was advised to call immediately if he has any concerning symptoms in the interval.  All questions were answered. The patient knows to call the clinic with any problems, questions or concerns. We can certainly see the patient much sooner if necessary.  Orders Placed This Encounter  Procedures  . Urine Culture     Standing Status:   Future    Number of Occurrences:   1    Standing Expiration Date:   04/03/2018  . Urinalysis, Microscopic - [facility]    Standing Status:   Future    Number of Occurrences:   1    Standing Expiration Date:   04/03/2018    Mikey Bussing, DNP, AGPCNP-BC, AOCNP 04/03/17  ADDENDUM: Hematology/Oncology Attending: I had a face-to-face encounter with the patient.  I recommended his care plan.  This is a very pleasant 67 years old white male with metastatic non-small cell lung cancer, squamous cell carcinoma.  He is currently undergoing systemic chemotherapy with carboplatin, paclitaxel and Keytruda status post 1 cycle.  He tolerated the first cycle of his treatment fairly well except for infusion reaction during the Taxol infusion that was treated and the patient continues the infusion later on with no concerning complaints.  He is feeling much better today except for aching pain after the Neulasta injection from the last treatment.  I recommended for him to proceed with cycle #2 today as a scheduled. We will see him back for follow-up visit in 3 weeks for evaluation with the start of cycle #3. The patient was advised to call immediately if he has any concerning symptoms in the interval.  Disclaimer: This note was dictated with voice recognition software. Similar sounding words can inadvertently be transcribed and may be missed upon review. Eilleen Kempf, MD 04/04/17

## 2017-04-03 NOTE — Assessment & Plan Note (Signed)
This is a very pleasant 67 year old white male recently diagnosed with a stage IV (T2a,N3,M1c)non-small cell lung cancer, squamous cell carcinoma presented with right upper lobe lung mass in addition to right hilar and mediastinal adenopathy as well as metastatic disease in the medial right thigh diagnosed in November 2018.  The patient is currently on systemic chemotherapy with carboplatin for an AUC of 5, paclitaxel 175 mg/m, and Keytruda 200 mg IV every 3 weeks.  Status post 1 cycle.  The patient had an infusion reaction to the Taxol including facial flushing and chest tightness during cycle 1 but otherwise tolerated it well.  The patient was seen with Dr. Julien Nordmann.  We discussed that we will continue the same premedications as before.  We discussed that we will asked nursing to run the Taxol infusion slower in the beginning to try to minimize the risk of reaction.  The patient is willing to proceed.  The patient will have weekly labs while on chemotherapy.  He will return in 3 weeks for evaluation prior to cycle #3.  For his increased urinary frequency, will check a urinalysis and urine culture.  Further treatment pending these results.  The patient was advised to call immediately if he has any concerning symptoms in the interval.  All questions were answered. The patient knows to call the clinic with any problems, questions or concerns. We can certainly see the patient much sooner if necessary.

## 2017-04-04 ENCOUNTER — Ambulatory Visit
Admission: RE | Admit: 2017-04-04 | Discharge: 2017-04-04 | Disposition: A | Discharge status: Home / Self Care | Payer: Managed Care, Other (non HMO) | Source: Ambulatory Visit | Attending: Radiation Oncology | Admitting: Radiation Oncology

## 2017-04-04 ENCOUNTER — Telehealth: Payer: Self-pay | Admitting: Oncology

## 2017-04-04 DIAGNOSIS — Z510 Encounter for antineoplastic radiation therapy: Secondary | ICD-10-CM | POA: Diagnosis not present

## 2017-04-04 LAB — URINE CULTURE: ORGANISM ID, BACTERIA: NO GROWTH

## 2017-04-04 NOTE — Telephone Encounter (Signed)
Scheduled appt per 1/3 sch msg - spoke with patient and confirmed.

## 2017-04-05 ENCOUNTER — Ambulatory Visit
Admission: RE | Admit: 2017-04-05 | Discharge: 2017-04-05 | Disposition: A | Discharge status: Home / Self Care | Payer: Managed Care, Other (non HMO) | Source: Ambulatory Visit | Attending: Radiation Oncology | Admitting: Radiation Oncology

## 2017-04-05 ENCOUNTER — Ambulatory Visit (HOSPITAL_BASED_OUTPATIENT_CLINIC_OR_DEPARTMENT_OTHER): Payer: Managed Care, Other (non HMO)

## 2017-04-05 VITALS — BP 145/68 | HR 72 | Temp 97.6°F | Resp 18

## 2017-04-05 DIAGNOSIS — Z5189 Encounter for other specified aftercare: Secondary | ICD-10-CM | POA: Diagnosis not present

## 2017-04-05 DIAGNOSIS — C3411 Malignant neoplasm of upper lobe, right bronchus or lung: Secondary | ICD-10-CM

## 2017-04-05 DIAGNOSIS — C3491 Malignant neoplasm of unspecified part of right bronchus or lung: Secondary | ICD-10-CM

## 2017-04-05 DIAGNOSIS — Z510 Encounter for antineoplastic radiation therapy: Secondary | ICD-10-CM | POA: Diagnosis not present

## 2017-04-05 MED ORDER — PEGFILGRASTIM INJECTION 6 MG/0.6ML ~~LOC~~
6.0000 mg | PREFILLED_SYRINGE | Freq: Once | SUBCUTANEOUS | Status: AC
  Administered 2017-04-05: 6 mg via SUBCUTANEOUS

## 2017-04-05 MED ORDER — PEGFILGRASTIM INJECTION 6 MG/0.6ML ~~LOC~~
PREFILLED_SYRINGE | SUBCUTANEOUS | Status: AC
  Filled 2017-04-05: qty 0.6

## 2017-04-08 ENCOUNTER — Ambulatory Visit
Admission: RE | Admit: 2017-04-08 | Discharge: 2017-04-08 | Disposition: A | Discharge status: Home / Self Care | Payer: Managed Care, Other (non HMO) | Source: Ambulatory Visit | Attending: Radiation Oncology | Admitting: Radiation Oncology

## 2017-04-08 DIAGNOSIS — Z510 Encounter for antineoplastic radiation therapy: Secondary | ICD-10-CM | POA: Diagnosis not present

## 2017-04-09 ENCOUNTER — Ambulatory Visit
Admission: RE | Admit: 2017-04-09 | Discharge: 2017-04-09 | Disposition: A | Discharge status: Home / Self Care | Payer: Managed Care, Other (non HMO) | Source: Ambulatory Visit | Attending: Radiation Oncology | Admitting: Radiation Oncology

## 2017-04-09 ENCOUNTER — Inpatient Hospital Stay: Payer: Managed Care, Other (non HMO)

## 2017-04-09 ENCOUNTER — Encounter: Payer: Self-pay | Admitting: Radiation Oncology

## 2017-04-09 DIAGNOSIS — Z881 Allergy status to other antibiotic agents status: Secondary | ICD-10-CM | POA: Diagnosis not present

## 2017-04-09 DIAGNOSIS — M1990 Unspecified osteoarthritis, unspecified site: Secondary | ICD-10-CM | POA: Diagnosis not present

## 2017-04-09 DIAGNOSIS — I129 Hypertensive chronic kidney disease with stage 1 through stage 4 chronic kidney disease, or unspecified chronic kidney disease: Secondary | ICD-10-CM | POA: Diagnosis not present

## 2017-04-09 DIAGNOSIS — Z885 Allergy status to narcotic agent status: Secondary | ICD-10-CM | POA: Diagnosis not present

## 2017-04-09 DIAGNOSIS — E7800 Pure hypercholesterolemia, unspecified: Secondary | ICD-10-CM | POA: Diagnosis not present

## 2017-04-09 DIAGNOSIS — Z5112 Encounter for antineoplastic immunotherapy: Secondary | ICD-10-CM | POA: Diagnosis not present

## 2017-04-09 DIAGNOSIS — N189 Chronic kidney disease, unspecified: Secondary | ICD-10-CM | POA: Diagnosis not present

## 2017-04-09 DIAGNOSIS — I4892 Unspecified atrial flutter: Secondary | ICD-10-CM | POA: Diagnosis not present

## 2017-04-09 DIAGNOSIS — Z79899 Other long term (current) drug therapy: Secondary | ICD-10-CM | POA: Diagnosis not present

## 2017-04-09 DIAGNOSIS — C3491 Malignant neoplasm of unspecified part of right bronchus or lung: Secondary | ICD-10-CM

## 2017-04-09 DIAGNOSIS — I498 Other specified cardiac arrhythmias: Secondary | ICD-10-CM | POA: Diagnosis not present

## 2017-04-09 DIAGNOSIS — J449 Chronic obstructive pulmonary disease, unspecified: Secondary | ICD-10-CM | POA: Diagnosis not present

## 2017-04-09 DIAGNOSIS — C3411 Malignant neoplasm of upper lobe, right bronchus or lung: Secondary | ICD-10-CM | POA: Diagnosis present

## 2017-04-09 DIAGNOSIS — Z87442 Personal history of urinary calculi: Secondary | ICD-10-CM | POA: Diagnosis not present

## 2017-04-09 DIAGNOSIS — Z5111 Encounter for antineoplastic chemotherapy: Secondary | ICD-10-CM | POA: Diagnosis present

## 2017-04-09 DIAGNOSIS — Z5189 Encounter for other specified aftercare: Secondary | ICD-10-CM | POA: Diagnosis not present

## 2017-04-09 DIAGNOSIS — Z510 Encounter for antineoplastic radiation therapy: Secondary | ICD-10-CM | POA: Diagnosis not present

## 2017-04-09 DIAGNOSIS — R590 Localized enlarged lymph nodes: Secondary | ICD-10-CM | POA: Diagnosis not present

## 2017-04-09 DIAGNOSIS — C7951 Secondary malignant neoplasm of bone: Secondary | ICD-10-CM | POA: Diagnosis not present

## 2017-04-09 LAB — COMPREHENSIVE METABOLIC PANEL
ALBUMIN: 3.4 g/dL — AB (ref 3.5–5.0)
ALT: 20 U/L (ref 0–55)
ANION GAP: 7 (ref 3–11)
AST: 13 U/L (ref 5–34)
Alkaline Phosphatase: 128 U/L (ref 40–150)
BILIRUBIN TOTAL: 1.1 mg/dL (ref 0.2–1.2)
BUN: 16 mg/dL (ref 7–26)
CHLORIDE: 103 mmol/L (ref 98–109)
CO2: 27 mmol/L (ref 22–29)
Calcium: 9 mg/dL (ref 8.4–10.4)
Creatinine, Ser: 0.81 mg/dL (ref 0.70–1.30)
GFR calc Af Amer: >60 mL/min (ref >60)
GFR calc non Af Amer: >60 mL/min (ref >60)
GLUCOSE: 145 mg/dL — AB (ref 70–140)
POTASSIUM: 4.1 mmol/L (ref 3.5–5.1)
SODIUM: 137 mmol/L (ref 136–145)
Total Protein: 6.8 g/dL (ref 6.4–8.3)

## 2017-04-09 LAB — CBC WITH DIFFERENTIAL/PLATELET
ABS GRANULOCYTE: 5.6 10*3/uL (ref 1.5–6.5)
BASOS PCT: 1 %
Basophils Absolute: 0 10*3/uL (ref 0.0–0.1)
Eosinophils Absolute: 0.4 10*3/uL (ref 0.0–0.5)
Eosinophils Relative: 5 %
HEMATOCRIT: 40.7 % (ref 38.4–49.9)
HEMOGLOBIN: 13.4 g/dL (ref 13.0–17.1)
Lymphocytes Relative: 16 %
Lymphs Abs: 1.2 10*3/uL (ref 0.9–3.3)
MCH: 28.1 pg (ref 27.2–33.4)
MCHC: 32.9 g/dL (ref 32.0–36.0)
MCV: 85.5 fL (ref 79.3–98.0)
MONO ABS: 0.4 10*3/uL (ref 0.1–0.9)
MONOS PCT: 5 %
NEUTROS ABS: 5.6 10*3/uL (ref 1.5–6.5)
Neutrophils Relative %: 73 %
Platelets: 118 10*3/uL — ABNORMAL LOW (ref 140–400)
RBC: 4.76 MIL/uL (ref 4.20–5.82)
RDW: 15.2 % (ref 11.0–15.6)
WBC: 7.6 10*3/uL (ref 4.0–10.3)

## 2017-04-11 ENCOUNTER — Encounter: Payer: Self-pay | Admitting: Internal Medicine

## 2017-04-11 NOTE — Progress Notes (Signed)
Called patient's sgo(Glenda) and advised that [employer] currently has funds available for his diagnosis for copay assistance. I provided the phone number which they ay apply via phone and advised to have his income available for the year when they call. She verbalized understanding. I explained that the portal was experiencing technical difficulties which is preventing me from being able to apply via phone. She verbalized understanding and thanked me for the call. They have my contact information for any additional financial questions or concerns. Also advised them if approved, to provide me with a copy to submit to billing. She verbalized understanding.

## 2017-04-16 ENCOUNTER — Inpatient Hospital Stay: Payer: Managed Care, Other (non HMO)

## 2017-04-16 DIAGNOSIS — R5382 Chronic fatigue, unspecified: Secondary | ICD-10-CM

## 2017-04-16 DIAGNOSIS — C3491 Malignant neoplasm of unspecified part of right bronchus or lung: Secondary | ICD-10-CM

## 2017-04-16 DIAGNOSIS — Z5112 Encounter for antineoplastic immunotherapy: Secondary | ICD-10-CM | POA: Diagnosis not present

## 2017-04-16 LAB — CBC WITH DIFFERENTIAL/PLATELET
BASOS ABS: 0 10*3/uL (ref 0.0–0.1)
BASOS PCT: 0 %
Eosinophils Absolute: 0.1 10*3/uL (ref 0.0–0.5)
Eosinophils Relative: 1 %
HEMATOCRIT: 38 % — AB (ref 38.4–49.9)
Hemoglobin: 12.2 g/dL — ABNORMAL LOW (ref 13.0–17.1)
LYMPHS PCT: 14 %
Lymphs Abs: 1.6 10*3/uL (ref 0.9–3.3)
MCH: 27.6 pg (ref 27.2–33.4)
MCHC: 32.2 g/dL (ref 32.0–36.0)
MCV: 85.8 fL (ref 79.3–98.0)
Monocytes Absolute: 0.7 10*3/uL (ref 0.1–0.9)
Monocytes Relative: 6 %
NEUTROS ABS: 8.8 10*3/uL — AB (ref 1.5–6.5)
Neutrophils Relative %: 79 %
Platelets: 179 10*3/uL (ref 140–400)
RBC: 4.43 MIL/uL (ref 4.20–5.82)
RDW: 15.5 % (ref 11.0–15.6)
WBC: 11.2 10*3/uL — ABNORMAL HIGH (ref 4.0–10.3)

## 2017-04-16 LAB — TSH: TSH: 1.608 u[IU]/mL (ref 0.320–4.118)

## 2017-04-16 LAB — COMPREHENSIVE METABOLIC PANEL
ALBUMIN: 3.4 g/dL — AB (ref 3.5–5.0)
ALK PHOS: 118 U/L (ref 40–150)
ALT: 23 U/L (ref 0–55)
AST: 16 U/L (ref 5–34)
Anion gap: 8 (ref 3–11)
BILIRUBIN TOTAL: 0.5 mg/dL (ref 0.2–1.2)
BUN: 11 mg/dL (ref 7–26)
CO2: 29 mmol/L (ref 22–29)
CREATININE: 0.9 mg/dL (ref 0.70–1.30)
Calcium: 9.3 mg/dL (ref 8.4–10.4)
Chloride: 102 mmol/L (ref 98–109)
GFR calc Af Amer: >60 mL/min (ref >60)
GFR calc non Af Amer: >60 mL/min (ref >60)
GLUCOSE: 129 mg/dL (ref 70–140)
POTASSIUM: 4.6 mmol/L (ref 3.5–5.1)
Sodium: 139 mmol/L (ref 136–145)
TOTAL PROTEIN: 6.6 g/dL (ref 6.4–8.3)

## 2017-04-18 NOTE — Progress Notes (Signed)
  Radiation Oncology         [phone]) [phone] ________________________________  Name: Aaron Sellers MRN: [id number]  Date: 04/09/2017  DOB: [birth year]  End of Treatment Note  Diagnosis:   67 y.o. male with Stage IV NSCLC, squamous cell carcinoma of the right upper lobe with metastatic disease to the soft tissue of the right thigh    Indication for treatment::  palliative       Radiation treatment dates:   03/18/2017 - 04/09/2017  Site/dose:   The right lower extremity was treated to 37.5 Gy in 15 fractions of 2.5 Gy using the Isodose Plan technique.  Narrative: The patient tolerated radiation treatment relatively well and noted some shrinkage of the targeted tumor. He experienced mild fatigue and intermittent pain with moderate swelling. He denied any numbness or weakness. He denied any skin issues.   Plan: The patient has completed radiation treatment. The patient will return to radiation oncology clinic for routine followup in one month. I advised the patient to call or return sooner if they have any questions or concerns related to their recovery or treatment. ________________________________  Jodelle Gross, MD, PhD  This document serves as a record of services personally performed by Kyung Rudd, MD. It was created on his behalf by Rae Lips, a trained medical scribe. The creation of this record is based on the scribe's personal observations and the provider's statements to them. This document has been checked and approved by the attending provider.

## 2017-04-19 ENCOUNTER — Encounter: Payer: Self-pay | Admitting: Radiation Oncology

## 2017-04-19 NOTE — Progress Notes (Signed)
Rec'd from [employer], a disability packet on Aaron Sellers. It is documented here that it may take up to 10 working days to be completed by nursing/physician. Forms placed in orange folder and on the cart for nursing.

## 2017-04-23 ENCOUNTER — Inpatient Hospital Stay (HOSPITAL_BASED_OUTPATIENT_CLINIC_OR_DEPARTMENT_OTHER): Payer: Managed Care, Other (non HMO) | Admitting: Oncology

## 2017-04-23 ENCOUNTER — Telehealth: Payer: Self-pay | Admitting: Cardiology

## 2017-04-23 ENCOUNTER — Inpatient Hospital Stay: Payer: Managed Care, Other (non HMO)

## 2017-04-23 ENCOUNTER — Encounter: Payer: Self-pay | Admitting: Oncology

## 2017-04-23 ENCOUNTER — Telehealth: Payer: Self-pay | Admitting: Oncology

## 2017-04-23 VITALS — BP 130/80 | HR 93 | Temp 97.7°F | Resp 18 | Ht 79.0 in | Wt 293.6 lb

## 2017-04-23 DIAGNOSIS — Z5112 Encounter for antineoplastic immunotherapy: Secondary | ICD-10-CM | POA: Diagnosis not present

## 2017-04-23 DIAGNOSIS — I4892 Unspecified atrial flutter: Secondary | ICD-10-CM | POA: Diagnosis not present

## 2017-04-23 DIAGNOSIS — I498 Other specified cardiac arrhythmias: Secondary | ICD-10-CM

## 2017-04-23 DIAGNOSIS — M1990 Unspecified osteoarthritis, unspecified site: Secondary | ICD-10-CM

## 2017-04-23 DIAGNOSIS — I129 Hypertensive chronic kidney disease with stage 1 through stage 4 chronic kidney disease, or unspecified chronic kidney disease: Secondary | ICD-10-CM | POA: Diagnosis not present

## 2017-04-23 DIAGNOSIS — C7951 Secondary malignant neoplasm of bone: Secondary | ICD-10-CM | POA: Diagnosis not present

## 2017-04-23 DIAGNOSIS — N189 Chronic kidney disease, unspecified: Secondary | ICD-10-CM

## 2017-04-23 DIAGNOSIS — Z881 Allergy status to other antibiotic agents status: Secondary | ICD-10-CM

## 2017-04-23 DIAGNOSIS — J449 Chronic obstructive pulmonary disease, unspecified: Secondary | ICD-10-CM

## 2017-04-23 DIAGNOSIS — C3491 Malignant neoplasm of unspecified part of right bronchus or lung: Secondary | ICD-10-CM

## 2017-04-23 DIAGNOSIS — Z87442 Personal history of urinary calculi: Secondary | ICD-10-CM | POA: Diagnosis not present

## 2017-04-23 DIAGNOSIS — C3411 Malignant neoplasm of upper lobe, right bronchus or lung: Secondary | ICD-10-CM

## 2017-04-23 DIAGNOSIS — E7800 Pure hypercholesterolemia, unspecified: Secondary | ICD-10-CM

## 2017-04-23 DIAGNOSIS — R590 Localized enlarged lymph nodes: Secondary | ICD-10-CM

## 2017-04-23 DIAGNOSIS — Z5111 Encounter for antineoplastic chemotherapy: Secondary | ICD-10-CM

## 2017-04-23 DIAGNOSIS — Z79899 Other long term (current) drug therapy: Secondary | ICD-10-CM

## 2017-04-23 DIAGNOSIS — Z885 Allergy status to narcotic agent status: Secondary | ICD-10-CM

## 2017-04-23 LAB — CBC WITH DIFFERENTIAL/PLATELET
BASOS ABS: 0.1 10*3/uL (ref 0.0–0.1)
BASOS PCT: 1 %
EOS ABS: 0.1 10*3/uL (ref 0.0–0.5)
EOS PCT: 1 %
HCT: 41.7 % (ref 38.4–49.9)
Hemoglobin: 13.7 g/dL (ref 13.0–17.1)
Lymphocytes Relative: 21 %
Lymphs Abs: 1.6 10*3/uL (ref 0.9–3.3)
MCH: 28.5 pg (ref 27.2–33.4)
MCHC: 32.9 g/dL (ref 32.0–36.0)
MCV: 86.9 fL (ref 79.3–98.0)
Monocytes Absolute: 0.9 10*3/uL (ref 0.1–0.9)
Monocytes Relative: 11 %
Neutro Abs: 5.2 10*3/uL (ref 1.5–6.5)
Neutrophils Relative %: 66 %
Platelets: 193 10*3/uL (ref 140–400)
RBC: 4.8 MIL/uL (ref 4.20–5.82)
RDW: 15.5 % (ref 11.0–15.6)
WBC: 7.8 10*3/uL (ref 4.0–10.3)

## 2017-04-23 LAB — COMPREHENSIVE METABOLIC PANEL
ALT: 21 U/L (ref 0–55)
AST: 14 U/L (ref 5–34)
Albumin: 3.6 g/dL (ref 3.5–5.0)
Alkaline Phosphatase: 100 U/L (ref 40–150)
Anion gap: 10 (ref 3–11)
BILIRUBIN TOTAL: 0.6 mg/dL (ref 0.2–1.2)
BUN: 11 mg/dL (ref 7–26)
CO2: 26 mmol/L (ref 22–29)
CREATININE: 0.89 mg/dL (ref 0.70–1.30)
Calcium: 9.2 mg/dL (ref 8.4–10.4)
Chloride: 103 mmol/L (ref 98–109)
GFR calc Af Amer: >60 mL/min (ref >60)
GFR calc non Af Amer: >60 mL/min (ref >60)
Glucose, Bld: 174 mg/dL — ABNORMAL HIGH (ref 70–140)
Potassium: 4 mmol/L (ref 3.5–5.1)
Sodium: 139 mmol/L (ref 136–145)
TOTAL PROTEIN: 7 g/dL (ref 6.4–8.3)

## 2017-04-23 NOTE — Progress Notes (Signed)
[facility] OFFICE PROGRESS NOTE  Sheryle Hail, PA-C [address] [place] [address]  DIAGNOSIS: Stage IV (T2a,N3,M1c)non-small cell lung cancer, squamous cell carcinoma presented with right upper lobe lung mass in addition to right hilar and mediastinal adenopathy as well as metastatic disease in the medial right thigh diagnosed in November 2018.  PRIOR THERAPY: The patient received radiation to the right lower extremity mass.  Completed 04/09/2017.  CURRENT THERAPY: Carboplatin for AUC of 5, paclitaxel 175 mg/M2 and Keytruda 200 mg IV every 3 weeks.First dose on 03/12/2017.  Status post 2 cycles.  INTERVAL HISTORY: Aaron Sellers 67 y.o. male returns for routine follow-up visit accompanied by his wife.  The patient is feeling fine today has no specific complaints.  He denies fevers and chills.  Denies chest pain, shortness breath, cough, hemoptysis.  Denies nausea, vomiting, constipation, diarrhea.  He completed radiation to the right lower extremity mass since his last visit.  He reports that the mass is smaller.  The patient is here for evaluation prior to cycle 3 of his chemotherapy.  MEDICAL HISTORY: Past Medical History:  Diagnosis Date  . Arthritis   . BPH (benign prostatic hypertrophy) with urinary retention   . Chronic kidney disease    hx of kidney stones  2011  . COPD (chronic obstructive pulmonary disease) ([facility])    has been smoking since he was 22 ish--  . High cholesterol   . Hypertension    dx around 2011    ALLERGIES:  is allergic to ciprofloxacin; adhesive [tape]; and codeine.  MEDICATIONS:  Current Outpatient Medications  Medication Sig Dispense Refill  . atorvastatin (LIPITOR) 10 MG tablet Take 10 mg by mouth daily.    Marland Kitchen diltiazem (CARDIZEM CD) 240 MG 24 hr capsule Take 1 capsule (240 mg total) by mouth daily. 30 capsule 2  . diphenhydrAMINE (BENADRYL) 25 MG tablet Take 25 mg every 6 (six) hours as needed by mouth.    . famotidine  (PEPCID) 10 MG tablet Take 10 mg by mouth as needed for heartburn or indigestion.    . hydrocortisone 2.5 % cream Apply 1 application topically 2 (two) times daily as needed (skin irritations).     Marland Kitchen ibuprofen (ADVIL,MOTRIN) 200 MG tablet Take 600-800 mg by mouth every 6 (six) hours as needed for mild pain.     Marland Kitchen prochlorperazine (COMPAZINE) 10 MG tablet Take 1 tablet (10 mg total) by mouth every 6 (six) hours as needed for nausea or vomiting. (Patient not taking: Reported on 03/05/2017) 30 tablet 0  . tamsulosin (FLOMAX) 0.4 MG CAPS capsule Take 0.4 mg by mouth daily.    . traMADol (ULTRAM) 50 MG tablet Take 1-2 tablets (50-100 mg total) by mouth every 6 (six) hours as needed. 60 tablet 0   No current facility-administered medications for this visit.    Facility-Administered Medications Ordered in Other Visits  Medication Dose Route Frequency Provider Last Rate Last Dose  . 0.9 %  sodium chloride infusion   Intravenous Once Curt Bears, MD      . heparin lock flush 100 unit/mL  500 Units Intracatheter Once PRN Curt Bears, MD      . pegfilgrastim (NEULASTA ONPRO KIT) injection 6 mg  6 mg Subcutaneous Once Curt Bears, MD      . sodium chloride flush (NS) 0.9 % injection 10 mL  10 mL Intracatheter PRN Curt Bears, MD      . sodium chloride flush (NS) 0.9 % injection 10 mL  10  mL Intracatheter PRN Curt Bears, MD        SURGICAL HISTORY:  Past Surgical History:  Procedure Laterality Date  . HERNIA REPAIR     umbilical hernia  05/6710  . INCISION AND DRAINAGE ABSCESS     "down the middle" of his buttocks  2015  . TOTAL KNEE ARTHROPLASTY Left 10/29/2014   Procedure: TOTAL KNEE ARTHROPLASTY;  Surgeon: Dorna Leitz, MD;  Location: [facility];  Service: Orthopedics;  Laterality: Left;    REVIEW OF SYSTEMS:   Review of Systems  Constitutional: Negative for appetite change, chills, fatigue, fever and unexpected weight change.  HENT:   Negative for mouth sores, nosebleeds, sore  throat and trouble swallowing.   Eyes: Negative for eye problems and icterus.  Respiratory: Negative for cough, hemoptysis, shortness of breath and wheezing.   Cardiovascular: Negative for chest pain and leg swelling.  Gastrointestinal: Negative for abdominal pain, constipation, diarrhea, nausea and vomiting.  Genitourinary: Negative for bladder incontinence, difficulty urinating, dysuria, frequency and hematuria.   Musculoskeletal: Negative for back pain, gait problem, neck pain and neck stiffness.  Skin: Negative for itching and rash.  Neurological: Negative for dizziness, extremity weakness, gait problem, headaches, light-headedness and seizures.  Hematological: Negative for adenopathy. Does not bruise/bleed easily.  Psychiatric/Behavioral: Negative for confusion, depression and sleep disturbance. The patient is not nervous/anxious.     PHYSICAL EXAMINATION:  Blood pressure 130/80, pulse 93, temperature 97.7 F (36.5 C), temperature source Oral, resp. rate 18, height '6\' 7"'$  (2.007 m), weight 293 lb 9.6 oz (133.2 kg), SpO2 99 %.  ECOG PERFORMANCE STATUS: 1 - Symptomatic but completely ambulatory  Physical Exam  Constitutional: Oriented to person, place, and time and well-developed, well-nourished, and in no distress. No distress.  HENT:  Head: Normocephalic and atraumatic.  Mouth/Throat: Oropharynx is clear and moist. No oropharyngeal exudate.  Eyes: Conjunctivae are normal. Right eye exhibits no discharge. Left eye exhibits no discharge. No scleral icterus.  Neck: Normal range of motion. Neck supple.  Cardiovascular: Intact distal pulses.  Heart rate is tachycardic and irregular. Pulmonary/Chest: Effort normal and breath sounds normal. No respiratory distress. No wheezes. No rales.  Abdominal: Soft. Bowel sounds are normal. Exhibits no distension and no mass. There is no tenderness.  Musculoskeletal: Normal range of motion. Exhibits no edema.  Lymphadenopathy:    No cervical  adenopathy.  Neurological: Alert and oriented to person, place, and time. Exhibits normal muscle tone. Gait normal. Coordination normal.  Skin: Skin is warm and dry. No rash noted. Not diaphoretic. No erythema. No pallor.  Psychiatric: Mood, memory and judgment normal.  Vitals reviewed.  LABORATORY DATA: Lab Results  Component Value Date   WBC 7.8 04/23/2017   HGB 13.7 04/23/2017   HCT 41.7 04/23/2017   MCV 86.9 04/23/2017   PLT 193 04/23/2017      Chemistry      Component Value Date/Time   NA 139 04/23/2017 1053   NA 137 04/03/2017 1030   K 4.0 04/23/2017 1053   K 4.1 04/03/2017 1030   CL 103 04/23/2017 1053   CO2 26 04/23/2017 1053   CO2 27 04/03/2017 1030   BUN 11 04/23/2017 1053   BUN 15.5 04/03/2017 1030   CREATININE 0.89 04/23/2017 1053   CREATININE 0.8 04/03/2017 1030      Component Value Date/Time   CALCIUM 9.2 04/23/2017 1053   CALCIUM 9.4 04/03/2017 1030   ALKPHOS 100 04/23/2017 1053   ALKPHOS 97 04/03/2017 1030   AST 14 04/23/2017 1053  AST 14 04/03/2017 1030   ALT 21 04/23/2017 1053   ALT 21 04/03/2017 1030   BILITOT 0.6 04/23/2017 1053   BILITOT 0.67 04/03/2017 1030     TSH on 04/16/2017 was 1.608  RADIOGRAPHIC STUDIES:  No results found.   ASSESSMENT/PLAN:  Stage IV squamous cell carcinoma of right lung [facility]) This is a very pleasant 67 year old white male recently diagnosed with a stage IV (T2a,N3,M1c)non-small cell lung cancer, squamous cell carcinoma presented with right upper lobe lung mass in addition to right hilar and mediastinal adenopathy as well as metastatic disease in the medial right thigh diagnosed in November 2018.  The patient is currently on systemic chemotherapy with carboplatin for an AUC of 5, paclitaxel 175 mg/m, and Keytruda 200 mg IV every 3 weeks. Status post 2 cycles.  The patient had an infusion reaction to the Taxol including facial flushing and chest tightness during cycle 1 but otherwise tolerated it well.  The  rate of the Taxol was slowed during the second cycle and he had no infusion reaction.  The patient's heart rate is elevated on exam today and is irregular.  Review of prior cardiology notes show that the patient was in normal sinus rhythm at his last visit in November 2018.  The patient continues on diltiazem 240 mg daily and has been taking this regularly.  He took this this morning.  He is asymptomatic.  EKG was obtained in our office and reviewed with cardiology (Dr. Harl Bowie) by telephone.  EKG is consistent with a flutter.  We will plan to hold the patient's chemotherapy today.  Cardiology will contact the patient and make adjustments to his diltiazem dosing.  He will have a follow-up visit with cardiology within 1-2 days.  The patient will be contacted by the cardiology office with further instructions.  The patient was instructed to report to the emergency room if he develops chest pain, increased shortness of breath, or any dizziness or lightheadedness.  We will bring the patient back next week to reevaluate him prior to cycle 3 of his chemotherapy.  The patient was advised to call immediately if he has any concerning symptoms in the interval.  All questions were answered. The patient knows to call the clinic with any problems, questions or concerns. We can certainly see the patient much sooner if necessary.  Orders Placed This Encounter  Procedures  . Ambulatory referral to Cardiology    Referral Priority:   Urgent    Referral Type:   Consultation    Referral Reason:   Specialty Services Required    Referred to Provider:   Arnoldo Lenis, MD    Requested Specialty:   Cardiology    Number of Visits Requested:   1  . EKG 12-Lead    Ordered by an unspecified provider     Mikey Bussing, DNP, AGPCNP-BC, AOCNP 04/23/17

## 2017-04-23 NOTE — Telephone Encounter (Signed)
Gave patient avs and calendar with appts per 1/22 los.

## 2017-04-23 NOTE — Assessment & Plan Note (Signed)
This is a very pleasant 67 year old white male recently diagnosed with a stage IV (T2a,N3,M1c)non-small cell lung cancer, squamous cell carcinoma presented with right upper lobe lung mass in addition to right hilar and mediastinal adenopathy as well as metastatic disease in the medial right thigh diagnosed in November 2018.  The patient is currently on systemic chemotherapy with carboplatin for an AUC of 5, paclitaxel 175 mg/m, and Keytruda 200 mg IV every 3 weeks. Status post 2 cycles.  The patient had an infusion reaction to the Taxol including facial flushing and chest tightness during cycle 1 but otherwise tolerated it well.  The rate of the Taxol was slowed during the second cycle and he had no infusion reaction.  The patient's heart rate is elevated on exam today and is irregular.  Review of prior cardiology notes show that the patient was in normal sinus rhythm at his last visit in November 2018.  The patient continues on diltiazem 240 mg daily and has been taking this regularly.  He took this this morning.  He is asymptomatic.  EKG was obtained in our office and reviewed with cardiology (Dr. Harl Bowie) by telephone.  EKG is consistent with a flutter.  We will plan to hold the patient's chemotherapy today.  Cardiology will contact the patient and make adjustments to his diltiazem dosing.  He will have a follow-up visit with cardiology within 1-2 days.  The patient will be contacted by the cardiology office with further instructions.  The patient was instructed to report to the emergency room if he develops chest pain, increased shortness of breath, or any dizziness or lightheadedness.  We will bring the patient back next week to reevaluate him prior to cycle 3 of his chemotherapy.  The patient was advised to call immediately if he has any concerning symptoms in the interval.  All questions were answered. The patient knows to call the clinic with any problems, questions or concerns. We can  certainly see the patient much sooner if necessary.

## 2017-04-23 NOTE — Progress Notes (Signed)
Discussed patient with cancer center staff, found to be in aflutter with RVR 140s after presenting for chemo. Patient denies any symptoms, bp is WNL. I will have my staff increase his dilt to 300mg  daily and have him return to clinic on Thursday for nursing visit with EKG. In absence of symptoms and stable bp do not see indication for ER or inpatient managemtn at this time   Zandra Abts MD

## 2017-04-24 ENCOUNTER — Telehealth: Payer: Self-pay | Admitting: Internal Medicine

## 2017-04-24 MED ORDER — DILTIAZEM HCL ER COATED BEADS 300 MG PO CP24: 300.0000 mg | ORAL_CAPSULE | Freq: Every day | ORAL | 3 refills | Status: DC

## 2017-04-24 NOTE — Telephone Encounter (Signed)
04/24/17 @ 3:42 pm called and spoke with patient's significant other and confirmed FMLA paperwork was successfully faxed to [facility] @ [phone] on 04/12/17 @ 4:08 pm.  Requested copy for personal records.

## 2017-04-24 NOTE — Telephone Encounter (Signed)
Spoke to pt's wife, Pt made aware to increase his Diltiazem to 300 mg daily. He will come to office on Friday 1/25 for nurse visit.

## 2017-04-24 NOTE — Telephone Encounter (Signed)
04/12/17 FMLA paperwork was successfully faxed to [facility] @ [phone]

## 2017-04-24 NOTE — Telephone Encounter (Signed)
-----   Message from Arnoldo Lenis, MD sent at 04/23/2017 12:30 PM EST ----- Contacted by cancer center, patient in aflutter with elevated rates. Can we increase his dilt to 300mg  daily and have him come for a nursing visit Thursday for vitals and EKG   Zandra Abts MD

## 2017-04-24 NOTE — Telephone Encounter (Signed)
Called pt. No answer, unable to leave voicemail. Mailbox is full.

## 2017-04-26 ENCOUNTER — Other Ambulatory Visit: Payer: Self-pay

## 2017-04-26 ENCOUNTER — Ambulatory Visit (INDEPENDENT_AMBULATORY_CARE_PROVIDER_SITE_OTHER): Payer: Managed Care, Other (non HMO) | Admitting: *Deleted

## 2017-04-26 VITALS — BP 132/80 | HR 62 | Ht 79.0 in | Wt 296.4 lb

## 2017-04-26 DIAGNOSIS — I4891 Unspecified atrial fibrillation: Secondary | ICD-10-CM

## 2017-04-26 NOTE — Patient Instructions (Addendum)
   Please continue same regimen and plan.  Keep same follow up.

## 2017-04-26 NOTE — Progress Notes (Signed)
Patient presented to office for nurse visit to have EKG and vital signs due to recent increase of dilitazem to 300 mg. Patient has taken all doses of medications without any side effects. No c/o chest pain, dizziness or sob. Wife present.

## 2017-04-29 NOTE — Progress Notes (Signed)
Vitals and EKG look good, continue current meds   Zandra Abts MD

## 2017-04-30 ENCOUNTER — Telehealth: Payer: Self-pay | Admitting: Internal Medicine

## 2017-04-30 ENCOUNTER — Inpatient Hospital Stay (HOSPITAL_BASED_OUTPATIENT_CLINIC_OR_DEPARTMENT_OTHER): Payer: Managed Care, Other (non HMO) | Admitting: Internal Medicine

## 2017-04-30 ENCOUNTER — Inpatient Hospital Stay: Payer: Managed Care, Other (non HMO)

## 2017-04-30 ENCOUNTER — Encounter: Payer: Self-pay | Admitting: Internal Medicine

## 2017-04-30 VITALS — BP 128/75 | HR 60 | Temp 98.2°F | Resp 18

## 2017-04-30 DIAGNOSIS — J449 Chronic obstructive pulmonary disease, unspecified: Secondary | ICD-10-CM | POA: Diagnosis not present

## 2017-04-30 DIAGNOSIS — C7951 Secondary malignant neoplasm of bone: Secondary | ICD-10-CM

## 2017-04-30 DIAGNOSIS — Z5112 Encounter for antineoplastic immunotherapy: Secondary | ICD-10-CM | POA: Diagnosis not present

## 2017-04-30 DIAGNOSIS — I4892 Unspecified atrial flutter: Secondary | ICD-10-CM

## 2017-04-30 DIAGNOSIS — I129 Hypertensive chronic kidney disease with stage 1 through stage 4 chronic kidney disease, or unspecified chronic kidney disease: Secondary | ICD-10-CM

## 2017-04-30 DIAGNOSIS — Z79899 Other long term (current) drug therapy: Secondary | ICD-10-CM | POA: Diagnosis not present

## 2017-04-30 DIAGNOSIS — M1990 Unspecified osteoarthritis, unspecified site: Secondary | ICD-10-CM | POA: Diagnosis not present

## 2017-04-30 DIAGNOSIS — E7800 Pure hypercholesterolemia, unspecified: Secondary | ICD-10-CM | POA: Diagnosis not present

## 2017-04-30 DIAGNOSIS — R590 Localized enlarged lymph nodes: Secondary | ICD-10-CM

## 2017-04-30 DIAGNOSIS — Z885 Allergy status to narcotic agent status: Secondary | ICD-10-CM

## 2017-04-30 DIAGNOSIS — I498 Other specified cardiac arrhythmias: Secondary | ICD-10-CM

## 2017-04-30 DIAGNOSIS — C3411 Malignant neoplasm of upper lobe, right bronchus or lung: Secondary | ICD-10-CM | POA: Diagnosis not present

## 2017-04-30 DIAGNOSIS — N189 Chronic kidney disease, unspecified: Secondary | ICD-10-CM

## 2017-04-30 DIAGNOSIS — Z881 Allergy status to other antibiotic agents status: Secondary | ICD-10-CM

## 2017-04-30 DIAGNOSIS — Z87442 Personal history of urinary calculi: Secondary | ICD-10-CM

## 2017-04-30 DIAGNOSIS — C3491 Malignant neoplasm of unspecified part of right bronchus or lung: Secondary | ICD-10-CM

## 2017-04-30 DIAGNOSIS — C3490 Malignant neoplasm of unspecified part of unspecified bronchus or lung: Secondary | ICD-10-CM

## 2017-04-30 LAB — COMPREHENSIVE METABOLIC PANEL
ALBUMIN: 3.6 g/dL (ref 3.5–5.0)
ALK PHOS: 91 U/L (ref 40–150)
ALT: 20 U/L (ref 0–55)
AST: 16 U/L (ref 5–34)
Anion gap: 8 (ref 3–11)
BILIRUBIN TOTAL: 0.6 mg/dL (ref 0.2–1.2)
BUN: 12 mg/dL (ref 7–26)
CALCIUM: 9.3 mg/dL (ref 8.4–10.4)
CO2: 28 mmol/L (ref 22–29)
CREATININE: 0.89 mg/dL (ref 0.70–1.30)
Chloride: 104 mmol/L (ref 98–109)
GFR calc Af Amer: >60 mL/min (ref >60)
GLUCOSE: 160 mg/dL — AB (ref 70–140)
POTASSIUM: 3.9 mmol/L (ref 3.5–5.1)
Sodium: 140 mmol/L (ref 136–145)
TOTAL PROTEIN: 6.9 g/dL (ref 6.4–8.3)

## 2017-04-30 LAB — CBC WITH DIFFERENTIAL/PLATELET
BASOS ABS: 0.1 10*3/uL (ref 0.0–0.1)
Basophils Relative: 1 %
EOS ABS: 0.3 10*3/uL (ref 0.0–0.5)
Eosinophils Relative: 3 %
HCT: 40.6 % (ref 38.4–49.9)
Hemoglobin: 13.5 g/dL (ref 13.0–17.1)
LYMPHS PCT: 18 %
Lymphs Abs: 1.6 10*3/uL (ref 0.9–3.3)
MCH: 28.8 pg (ref 27.2–33.4)
MCHC: 33.3 g/dL (ref 32.0–36.0)
MCV: 86.5 fL (ref 79.3–98.0)
MONO ABS: 0.8 10*3/uL (ref 0.1–0.9)
Monocytes Relative: 9 %
Neutro Abs: 6.4 10*3/uL (ref 1.5–6.5)
Neutrophils Relative %: 69 %
Platelets: 210 10*3/uL (ref 140–400)
RBC: 4.69 MIL/uL (ref 4.20–5.82)
RDW: 17.3 % — AB (ref 11.0–15.6)
WBC: 9.2 10*3/uL (ref 4.0–10.3)

## 2017-04-30 MED ORDER — PALONOSETRON HCL INJECTION 0.25 MG/5ML
0.2500 mg | Freq: Once | INTRAVENOUS | Status: AC
  Administered 2017-04-30: 0.25 mg via INTRAVENOUS

## 2017-04-30 MED ORDER — FAMOTIDINE IN NACL 20-0.9 MG/50ML-% IV SOLN
INTRAVENOUS | Status: AC
  Filled 2017-04-30: qty 50

## 2017-04-30 MED ORDER — PEMBROLIZUMAB CHEMO INJECTION 100 MG/4ML
200.0000 mg | Freq: Once | INTRAVENOUS | Status: AC
  Administered 2017-04-30: 200 mg via INTRAVENOUS
  Filled 2017-04-30: qty 8

## 2017-04-30 MED ORDER — SODIUM CHLORIDE 0.9 % IV SOLN
Freq: Once | INTRAVENOUS | Status: AC
  Administered 2017-04-30: 14:00:00 via INTRAVENOUS

## 2017-04-30 MED ORDER — PEGFILGRASTIM 6 MG/0.6ML ~~LOC~~ PSKT
6.0000 mg | PREFILLED_SYRINGE | Freq: Once | SUBCUTANEOUS | Status: AC
  Administered 2017-04-30: 6 mg via SUBCUTANEOUS

## 2017-04-30 MED ORDER — FAMOTIDINE IN NACL 20-0.9 MG/50ML-% IV SOLN
20.0000 mg | Freq: Once | INTRAVENOUS | Status: AC
Start: 2017-04-30 — End: 2017-04-30
  Administered 2017-04-30: 20 mg via INTRAVENOUS

## 2017-04-30 MED ORDER — SODIUM CHLORIDE 0.9 % IV SOLN
750.0000 mg | Freq: Once | INTRAVENOUS | Status: AC
  Administered 2017-04-30: 750 mg via INTRAVENOUS
  Filled 2017-04-30: qty 75

## 2017-04-30 MED ORDER — SODIUM CHLORIDE 0.9 % IV SOLN
175.0000 mg/m2 | Freq: Once | INTRAVENOUS | Status: AC
  Administered 2017-04-30: 474 mg via INTRAVENOUS
  Filled 2017-04-30: qty 79

## 2017-04-30 MED ORDER — PALONOSETRON HCL INJECTION 0.25 MG/5ML
INTRAVENOUS | Status: AC
  Filled 2017-04-30: qty 5

## 2017-04-30 MED ORDER — DIPHENHYDRAMINE HCL 50 MG/ML IJ SOLN
INTRAMUSCULAR | Status: AC
  Filled 2017-04-30: qty 1

## 2017-04-30 MED ORDER — SODIUM CHLORIDE 0.9 % IV SOLN
20.0000 mg | Freq: Once | INTRAVENOUS | Status: AC
  Administered 2017-04-30: 20 mg via INTRAVENOUS
  Filled 2017-04-30: qty 2

## 2017-04-30 MED ORDER — DIPHENHYDRAMINE HCL 50 MG/ML IJ SOLN
50.0000 mg | Freq: Once | INTRAMUSCULAR | Status: AC
  Administered 2017-04-30: 50 mg via INTRAVENOUS

## 2017-04-30 MED ORDER — PEGFILGRASTIM 6 MG/0.6ML ~~LOC~~ PSKT
PREFILLED_SYRINGE | SUBCUTANEOUS | Status: AC
  Filled 2017-04-30: qty 0.6

## 2017-04-30 NOTE — Telephone Encounter (Signed)
Scheduled appt per 1/29 los - Gave patient AVS and calender per los. Central radiology to contact patient with ct schedule.

## 2017-04-30 NOTE — Progress Notes (Signed)
LM for pt to returncall

## 2017-04-30 NOTE — Progress Notes (Signed)
[facility] [phone]   [phone]  OFFICE PROGRESS NOTE  Sheryle Hail, PA-C [address] [place] [address]  DIAGNOSIS: Stage IV (T2a,N3,M1c)non-small cell lung cancer, squamous cell carcinoma presented with right upper lobe lung mass in addition to right hilar and mediastinal adenopathy as well as metastatic disease in the medial right thigh diagnosed in November 2018.  PRIOR THERAPY: Palliative radiotherapy to the right lower extremity mass completed April 09, 2017.  CURRENT THERAPY: Systemic chemotherapy with carboplatin for AC of 5, paclitaxel 175 mg/M2 and Keytruda 200 mg IV every 3 weeks.  First dose March 12, 2017, status post 2 cycles.  INTERVAL HISTORY: Aaron Sellers 67 y.o. male returns to the clinic today for follow-up visit accompanied by his wife.  The patient is feeling fine today with no specific complaints.  He continues to tolerate his treatment with carboplatin, paclitaxel and Keytruda fairly well.  He denied having any chest pain, shortness of breath, cough or hemoptysis.  He denied having any weight loss or night sweats.  He has no nausea, vomiting, diarrhea or constipation.  He is here today for evaluation before starting cycle #3.  MEDICAL HISTORY: Past Medical History:  Diagnosis Date  . Arthritis   . BPH (benign prostatic hypertrophy) with urinary retention   . Chronic kidney disease    hx of kidney stones  2011  . COPD (chronic obstructive pulmonary disease) ([facility])    has been smoking since he was 22 ish--  . High cholesterol   . Hypertension    dx around 2011    ALLERGIES:  is allergic to ciprofloxacin; adhesive [tape]; and codeine.  MEDICATIONS:  Current Outpatient Medications  Medication Sig Dispense Refill  . atorvastatin (LIPITOR) 10 MG tablet Take 10 mg by mouth daily.    Marland Kitchen diltiazem (CARDIZEM CD) 300 MG 24 hr capsule Take 1 capsule (300 mg total) by mouth daily. 30 capsule 3  .  diphenhydrAMINE (BENADRYL) 25 MG tablet Take 25 mg every 6 (six) hours as needed by mouth.    . famotidine (PEPCID) 10 MG tablet Take 10 mg by mouth as needed for heartburn or indigestion.    . hydrocortisone 2.5 % cream Apply 1 application topically 2 (two) times daily as needed (skin irritations).     Marland Kitchen ibuprofen (ADVIL,MOTRIN) 200 MG tablet Take 600-800 mg by mouth every 6 (six) hours as needed for mild pain.     Marland Kitchen prochlorperazine (COMPAZINE) 10 MG tablet Take 1 tablet (10 mg total) by mouth every 6 (six) hours as needed for nausea or vomiting. 30 tablet 0  . tamsulosin (FLOMAX) 0.4 MG CAPS capsule Take 0.4 mg by mouth daily.    . traMADol (ULTRAM) 50 MG tablet Take 1-2 tablets (50-100 mg total) by mouth every 6 (six) hours as needed. 60 tablet 0   No current facility-administered medications for this visit.    Facility-Administered Medications Ordered in Other Visits  Medication Dose Route Frequency Provider Last Rate Last Dose  . 0.9 %  sodium chloride infusion   Intravenous Once Curt Bears, MD      . heparin lock flush 100 unit/mL  500 Units Intracatheter Once PRN Curt Bears, MD      . pegfilgrastim (NEULASTA ONPRO KIT) injection 6 mg  6 mg Subcutaneous Once Curt Bears, MD      . sodium chloride flush (NS) 0.9 % injection 10 mL  10 mL Intracatheter PRN Curt Bears, MD      .  sodium chloride flush (NS) 0.9 % injection 10 mL  10 mL Intracatheter PRN Curt Bears, MD        SURGICAL HISTORY:  Past Surgical History:  Procedure Laterality Date  . HERNIA REPAIR     umbilical hernia  0/2542  . INCISION AND DRAINAGE ABSCESS     "down the middle" of his buttocks  2015  . TOTAL KNEE ARTHROPLASTY Left 10/29/2014   Procedure: TOTAL KNEE ARTHROPLASTY;  Surgeon: Dorna Leitz, MD;  Location: [facility];  Service: Orthopedics;  Laterality: Left;    REVIEW OF SYSTEMS:  A comprehensive review of systems was negative.   PHYSICAL EXAMINATION: General appearance: alert,  cooperative and no distress Head: Normocephalic, without obvious abnormality, atraumatic Neck: no adenopathy, no JVD, supple, symmetrical, trachea midline and thyroid not enlarged, symmetric, no tenderness/mass/nodules Lymph nodes: Cervical, supraclavicular, and axillary nodes normal. Resp: clear to auscultation bilaterally Back: symmetric, no curvature. ROM normal. No CVA tenderness. Cardio: regular rate and rhythm, S1, S2 normal, no murmur, click, rub or gallop GI: soft, non-tender; bowel sounds normal; no masses,  no organomegaly Extremities: extremities normal, atraumatic, no cyanosis or edema  ECOG PERFORMANCE STATUS: 1 - Symptomatic but completely ambulatory  Blood pressure 133/75, pulse 67, temperature (!) 97.5 F (36.4 C), temperature source Oral, resp. rate 18, height _0  (2.007 m), weight 294 lb 12.8 oz (133.7 kg), SpO2 98 %.  LABORATORY DATA: Lab Results  Component Value Date   WBC 7.8 04/23/2017   HGB 13.7 04/23/2017   HCT 41.7 04/23/2017   MCV 86.9 04/23/2017   PLT 193 04/23/2017      Chemistry      Component Value Date/Time   NA 139 04/23/2017 1053   NA 137 04/03/2017 1030   K 4.0 04/23/2017 1053   K 4.1 04/03/2017 1030   CL 103 04/23/2017 1053   CO2 26 04/23/2017 1053   CO2 27 04/03/2017 1030   BUN 11 04/23/2017 1053   BUN 15.5 04/03/2017 1030   CREATININE 0.89 04/23/2017 1053   CREATININE 0.8 04/03/2017 1030      Component Value Date/Time   CALCIUM 9.2 04/23/2017 1053   CALCIUM 9.4 04/03/2017 1030   ALKPHOS 100 04/23/2017 1053   ALKPHOS 97 04/03/2017 1030   AST 14 04/23/2017 1053   AST 14 04/03/2017 1030   ALT 21 04/23/2017 1053   ALT 21 04/03/2017 1030   BILITOT 0.6 04/23/2017 1053   BILITOT 0.67 04/03/2017 1030       RADIOGRAPHIC STUDIES: No results found.  ASSESSMENT AND PLAN: This is a very pleasant 67 years old white male with a stage IV non-small cell lung cancer, squamous cell carcinoma.  The patient is currently undergoing systemic  chemotherapy with carboplatin, paclitaxel and Keytruda status post 2 cycles. He continues to tolerate this treatment fairly well with no concerning complaints. I recommended for him to proceed with cycle #3 today as a scheduled. I will see him back for follow-up visit in 3 weeks for evaluation after repeating CT scan of the chest, abdomen and pelvis as well as scan of the right femur for restaging of his disease. The patient was advised to call immediately if he has any concerning symptoms in the interval. The patient voices understanding of current disease status and treatment options and is in agreement with the current care plan.  All questions were answered. The patient knows to call the clinic with any problems, questions or concerns. We can certainly see the patient much sooner if necessary.  I spent 10 minutes counseling the patient face to face. The total time spent in the appointment was 15 minutes.  Disclaimer: This note was dictated with voice recognition software. Similar sounding words can inadvertently be transcribed and may not be corrected upon review.

## 2017-04-30 NOTE — Patient Instructions (Signed)
[facility] Discharge Instructions for Patients Receiving Chemotherapy  Today you received the following chemotherapy agents: Keytruda, Taxol, Carboplatin  To help prevent nausea and vomiting after your treatment, we encourage you to take your nausea medication as directed.   If you develop nausea and vomiting that is not controlled by your nausea medication, call the clinic.   BELOW ARE SYMPTOMS THAT SHOULD BE REPORTED IMMEDIATELY:  *FEVER GREATER THAN 100.5 F  *CHILLS WITH OR WITHOUT FEVER  NAUSEA AND VOMITING THAT IS NOT CONTROLLED WITH YOUR NAUSEA MEDICATION  *UNUSUAL SHORTNESS OF BREATH  *UNUSUAL BRUISING OR BLEEDING  TENDERNESS IN MOUTH AND THROAT WITH OR WITHOUT PRESENCE OF ULCERS  *URINARY PROBLEMS  *BOWEL PROBLEMS  UNUSUAL RASH Items with * indicate a potential emergency and should be followed up as soon as possible.  Feel free to call the clinic should you have any questions or concerns. The clinic phone number is [phone].  Please show the CHEMO ALERT CARD at check-in to the Emergency Department and triage nurse.   

## 2017-05-01 NOTE — Progress Notes (Signed)
Pt aware and voiced understanding 

## 2017-05-03 ENCOUNTER — Telehealth: Payer: Self-pay | Admitting: Internal Medicine

## 2017-05-03 NOTE — Telephone Encounter (Signed)
05/03/17 @ 4:03 pm spoke with patient's spouse and she requested I refax the Disability form originally faxed to [facility] on 04/12/17 to [facility] @ [phone] and reference Claim # [id number].  I called her back to let her know it was successfully faxed.

## 2017-05-07 ENCOUNTER — Inpatient Hospital Stay: Payer: Managed Care, Other (non HMO) | Attending: Oncology

## 2017-05-07 DIAGNOSIS — I4892 Unspecified atrial flutter: Secondary | ICD-10-CM | POA: Diagnosis not present

## 2017-05-07 DIAGNOSIS — Z87891 Personal history of nicotine dependence: Secondary | ICD-10-CM | POA: Insufficient documentation

## 2017-05-07 DIAGNOSIS — Z7689 Persons encountering health services in other specified circumstances: Secondary | ICD-10-CM | POA: Insufficient documentation

## 2017-05-07 DIAGNOSIS — Z87442 Personal history of urinary calculi: Secondary | ICD-10-CM | POA: Diagnosis not present

## 2017-05-07 DIAGNOSIS — M1990 Unspecified osteoarthritis, unspecified site: Secondary | ICD-10-CM | POA: Diagnosis not present

## 2017-05-07 DIAGNOSIS — R5383 Other fatigue: Secondary | ICD-10-CM | POA: Insufficient documentation

## 2017-05-07 DIAGNOSIS — Z79899 Other long term (current) drug therapy: Secondary | ICD-10-CM | POA: Diagnosis not present

## 2017-05-07 DIAGNOSIS — E7800 Pure hypercholesterolemia, unspecified: Secondary | ICD-10-CM | POA: Diagnosis not present

## 2017-05-07 DIAGNOSIS — N132 Hydronephrosis with renal and ureteral calculous obstruction: Secondary | ICD-10-CM | POA: Diagnosis not present

## 2017-05-07 DIAGNOSIS — Z881 Allergy status to other antibiotic agents status: Secondary | ICD-10-CM | POA: Insufficient documentation

## 2017-05-07 DIAGNOSIS — Z5189 Encounter for other specified aftercare: Secondary | ICD-10-CM | POA: Diagnosis not present

## 2017-05-07 DIAGNOSIS — Z885 Allergy status to narcotic agent status: Secondary | ICD-10-CM | POA: Insufficient documentation

## 2017-05-07 DIAGNOSIS — Z5111 Encounter for antineoplastic chemotherapy: Secondary | ICD-10-CM | POA: Insufficient documentation

## 2017-05-07 DIAGNOSIS — G620 Drug-induced polyneuropathy: Secondary | ICD-10-CM | POA: Insufficient documentation

## 2017-05-07 DIAGNOSIS — J449 Chronic obstructive pulmonary disease, unspecified: Secondary | ICD-10-CM | POA: Insufficient documentation

## 2017-05-07 DIAGNOSIS — C3411 Malignant neoplasm of upper lobe, right bronchus or lung: Secondary | ICD-10-CM | POA: Diagnosis present

## 2017-05-07 DIAGNOSIS — C7951 Secondary malignant neoplasm of bone: Secondary | ICD-10-CM | POA: Diagnosis not present

## 2017-05-07 DIAGNOSIS — R300 Dysuria: Secondary | ICD-10-CM | POA: Insufficient documentation

## 2017-05-07 DIAGNOSIS — I129 Hypertensive chronic kidney disease with stage 1 through stage 4 chronic kidney disease, or unspecified chronic kidney disease: Secondary | ICD-10-CM | POA: Insufficient documentation

## 2017-05-07 DIAGNOSIS — Z5112 Encounter for antineoplastic immunotherapy: Secondary | ICD-10-CM | POA: Diagnosis not present

## 2017-05-07 DIAGNOSIS — R5382 Chronic fatigue, unspecified: Secondary | ICD-10-CM

## 2017-05-07 DIAGNOSIS — I498 Other specified cardiac arrhythmias: Secondary | ICD-10-CM | POA: Insufficient documentation

## 2017-05-07 DIAGNOSIS — N189 Chronic kidney disease, unspecified: Secondary | ICD-10-CM | POA: Diagnosis not present

## 2017-05-07 DIAGNOSIS — R1031 Right lower quadrant pain: Secondary | ICD-10-CM | POA: Diagnosis not present

## 2017-05-07 DIAGNOSIS — T451X5S Adverse effect of antineoplastic and immunosuppressive drugs, sequela: Secondary | ICD-10-CM | POA: Insufficient documentation

## 2017-05-07 DIAGNOSIS — C3491 Malignant neoplasm of unspecified part of right bronchus or lung: Secondary | ICD-10-CM

## 2017-05-07 LAB — COMPREHENSIVE METABOLIC PANEL
ALT: 22 U/L (ref 0–55)
AST: 16 U/L (ref 5–34)
Albumin: 3.4 g/dL — ABNORMAL LOW (ref 3.5–5.0)
Alkaline Phosphatase: 102 U/L (ref 40–150)
Anion gap: 10 (ref 3–11)
BUN: 13 mg/dL (ref 7–26)
CHLORIDE: 102 mmol/L (ref 98–109)
CO2: 26 mmol/L (ref 22–29)
CREATININE: 0.81 mg/dL (ref 0.70–1.30)
Calcium: 9.1 mg/dL (ref 8.4–10.4)
GFR calc Af Amer: >60 mL/min (ref >60)
Glucose, Bld: 136 mg/dL (ref 70–140)
Potassium: 4.5 mmol/L (ref 3.5–5.1)
Sodium: 138 mmol/L (ref 136–145)
Total Bilirubin: 0.6 mg/dL (ref 0.2–1.2)
Total Protein: 6.2 g/dL — ABNORMAL LOW (ref 6.4–8.3)

## 2017-05-07 LAB — CBC WITH DIFFERENTIAL/PLATELET
BASOS ABS: 0 10*3/uL (ref 0.0–0.1)
BASOS PCT: 0 %
EOS PCT: 3 %
Eosinophils Absolute: 0.2 10*3/uL (ref 0.0–0.5)
HCT: 39.6 % (ref 38.4–49.9)
Hemoglobin: 13 g/dL (ref 13.0–17.1)
LYMPHS PCT: 26 %
Lymphs Abs: 1.4 10*3/uL (ref 0.9–3.3)
MCH: 28.5 pg (ref 27.2–33.4)
MCHC: 32.9 g/dL (ref 32.0–36.0)
MCV: 86.7 fL (ref 79.3–98.0)
Monocytes Absolute: 0.8 10*3/uL (ref 0.1–0.9)
Monocytes Relative: 14 %
Neutro Abs: 3.2 10*3/uL (ref 1.5–6.5)
Neutrophils Relative %: 57 %
Platelets: 120 10*3/uL — ABNORMAL LOW (ref 140–400)
RBC: 4.56 MIL/uL (ref 4.20–5.82)
RDW: 17.1 % — AB (ref 11.0–14.6)
WBC: 5.6 10*3/uL (ref 4.0–10.3)

## 2017-05-07 LAB — TSH: TSH: 1.215 u[IU]/mL (ref 0.320–4.118)

## 2017-05-14 ENCOUNTER — Ambulatory Visit
Admission: RE | Admit: 2017-05-14 | Discharge: 2017-05-14 | Disposition: A | Discharge status: Home / Self Care | Payer: Managed Care, Other (non HMO) | Source: Ambulatory Visit | Attending: Radiation Oncology | Admitting: Radiation Oncology

## 2017-05-14 ENCOUNTER — Ambulatory Visit: Payer: Managed Care, Other (non HMO)

## 2017-05-14 ENCOUNTER — Inpatient Hospital Stay: Payer: Managed Care, Other (non HMO)

## 2017-05-14 ENCOUNTER — Ambulatory Visit: Payer: Managed Care, Other (non HMO) | Admitting: Internal Medicine

## 2017-05-14 ENCOUNTER — Other Ambulatory Visit: Payer: Managed Care, Other (non HMO)

## 2017-05-14 VITALS — BP 132/89 | HR 59 | Temp 98.0°F | Resp 20 | Ht 79.0 in | Wt 299.6 lb

## 2017-05-14 DIAGNOSIS — I890 Lymphedema, not elsewhere classified: Secondary | ICD-10-CM | POA: Insufficient documentation

## 2017-05-14 DIAGNOSIS — C3411 Malignant neoplasm of upper lobe, right bronchus or lung: Secondary | ICD-10-CM | POA: Insufficient documentation

## 2017-05-14 DIAGNOSIS — C3491 Malignant neoplasm of unspecified part of right bronchus or lung: Secondary | ICD-10-CM

## 2017-05-14 DIAGNOSIS — Z79899 Other long term (current) drug therapy: Secondary | ICD-10-CM | POA: Insufficient documentation

## 2017-05-14 DIAGNOSIS — R5382 Chronic fatigue, unspecified: Secondary | ICD-10-CM

## 2017-05-14 DIAGNOSIS — C7989 Secondary malignant neoplasm of other specified sites: Secondary | ICD-10-CM | POA: Diagnosis not present

## 2017-05-14 DIAGNOSIS — Z923 Personal history of irradiation: Secondary | ICD-10-CM | POA: Insufficient documentation

## 2017-05-14 DIAGNOSIS — F17210 Nicotine dependence, cigarettes, uncomplicated: Secondary | ICD-10-CM | POA: Diagnosis not present

## 2017-05-14 LAB — CBC WITH DIFFERENTIAL/PLATELET
BASOS ABS: 0.1 10*3/uL (ref 0.0–0.1)
BASOS PCT: 1 %
Eosinophils Absolute: 0.1 10*3/uL (ref 0.0–0.5)
Eosinophils Relative: 1 %
HEMATOCRIT: 39.7 % (ref 38.4–49.9)
HEMOGLOBIN: 12.9 g/dL — AB (ref 13.0–17.1)
Lymphocytes Relative: 17 %
Lymphs Abs: 1.5 10*3/uL (ref 0.9–3.3)
MCH: 28.5 pg (ref 27.2–33.4)
MCHC: 32.6 g/dL (ref 32.0–36.0)
MCV: 87.7 fL (ref 79.3–98.0)
MONOS PCT: 7 %
Monocytes Absolute: 0.6 10*3/uL (ref 0.1–0.9)
NEUTROS ABS: 6.5 10*3/uL (ref 1.5–6.5)
NEUTROS PCT: 74 %
Platelets: 180 10*3/uL (ref 140–400)
RBC: 4.53 MIL/uL (ref 4.20–5.82)
RDW: 17.4 % — ABNORMAL HIGH (ref 11.0–14.6)
WBC: 8.8 10*3/uL (ref 4.0–10.3)

## 2017-05-14 LAB — COMPREHENSIVE METABOLIC PANEL
ALBUMIN: 3.5 g/dL (ref 3.5–5.0)
ALK PHOS: 115 U/L (ref 40–150)
ALT: 18 U/L (ref 0–55)
AST: 15 U/L (ref 5–34)
Anion gap: 10 (ref 3–11)
BILIRUBIN TOTAL: 0.5 mg/dL (ref 0.2–1.2)
BUN: 7 mg/dL (ref 7–26)
CALCIUM: 9.2 mg/dL (ref 8.4–10.4)
CO2: 25 mmol/L (ref 22–29)
Chloride: 104 mmol/L (ref 98–109)
Creatinine, Ser: 0.89 mg/dL (ref 0.70–1.30)
GFR calc Af Amer: >60 mL/min (ref >60)
GFR calc non Af Amer: >60 mL/min (ref >60)
GLUCOSE: 110 mg/dL (ref 70–140)
Potassium: 4.2 mmol/L (ref 3.5–5.1)
Sodium: 139 mmol/L (ref 136–145)
TOTAL PROTEIN: 6.6 g/dL (ref 6.4–8.3)

## 2017-05-14 LAB — TSH: TSH: 2.205 u[IU]/mL (ref 0.320–4.118)

## 2017-05-14 MED ORDER — GABAPENTIN 300 MG PO CAPS: 300.0000 mg | ORAL_CAPSULE | Freq: Three times a day (TID) | ORAL | 1 refills | Status: DC

## 2017-05-15 NOTE — Progress Notes (Signed)
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   Narrative:  The patient returns today for routine follow-up. During treatment she did very well with radiotherapy and did not have significant desquamation.                             On review of systems, the patient states he's doing well. He reports the palpable mass in the right thigh is no longer palpable. He continues to note improvement in his lower extremity lymphedema. He is scheduled to undergo repeat imaging of his chest and right lower extremity on Monday next week. No other complaints are noted.   ALLERGIES:  is allergic to ciprofloxacin; adhesive [tape]; and codeine.  Meds: Current Outpatient Medications  Medication Sig Dispense Refill  . atorvastatin (LIPITOR) 10 MG tablet Take 10 mg by mouth daily.    Marland Kitchen diltiazem (CARDIZEM CD) 300 MG 24 hr capsule Take 1 capsule (300 mg total) by mouth daily. 30 capsule 3  . diphenhydrAMINE (BENADRYL) 25 MG tablet Take 25 mg every 6 (six) hours as needed by mouth.    . famotidine (PEPCID) 10 MG tablet Take 10 mg by mouth as needed for heartburn or indigestion.    Marland Kitchen ibuprofen (ADVIL,MOTRIN) 200 MG tablet Take 600-800 mg by mouth every 6 (six) hours as needed for mild pain.     . tamsulosin (FLOMAX) 0.4 MG CAPS capsule Take 0.4 mg by mouth daily.    Marland Kitchen gabapentin (NEURONTIN) 300 MG capsule Take 1 capsule (300 mg total) by mouth 3 (three) times daily. 90 capsule 1  . hydrocortisone 2.5 % cream  Apply 1 application topically 2 (two) times daily as needed (skin irritations).     . prochlorperazine (COMPAZINE) 10 MG tablet Take 1 tablet (10 mg total) by mouth every 6 (six) hours as needed for nausea or vomiting. (Patient not taking: Reported on 04/30/2017) 30 tablet 0  . traMADol (ULTRAM) 50 MG tablet Take 1-2 tablets (50-100 mg total) by mouth every 6 (six) hours as needed. (Patient not taking: Reported on 05/14/2017) 60 tablet 0   No current facility-administered medications for this encounter.    Facility-Administered Medications Ordered in Other Encounters  Medication Dose Route Frequency Provider Last Rate Last Dose  . 0.9 %  sodium chloride infusion   Intravenous Once Curt Bears, MD      . heparin lock flush 100 unit/mL  500 Units Intracatheter Once PRN Curt Bears, MD      . pegfilgrastim (NEULASTA ONPRO KIT) injection 6 mg  6 mg Subcutaneous Once Curt Bears, MD      . sodium chloride flush (NS) 0.9 % injection 10 mL  10 mL Intracatheter PRN Curt Bears, MD      . sodium chloride flush (NS) 0.9 % injection 10 mL  10 mL Intracatheter PRN Curt Bears, MD        Physical Findings:  height  is '6\' 7"'$  (2.007 m) and weight is 299 lb 9.6 oz (135.9 kg). His oral temperature is 98 F (36.7 C). His blood pressure is 132/89 and his pulse is 59 (abnormal). His respiration is 20 and oxygen saturation is 99%.  Pain Assessment Pain Score: 4  Pain Loc: Foot(Feet)/10 In general this is a well appearing Caucasian male in no acute distress. She's alert and oriented x4 and appropriate throughout the examination. Cardiopulmonary assessment is negative for acute distress and she exhibits normal effort.  Lab Findings: Lab Results  Component Value Date   WBC 8.8 05/14/2017   HGB 12.9 (L) 05/14/2017   HCT 39.7 05/14/2017   MCV 87.7 05/14/2017   PLT 180 05/14/2017     Radiographic Findings: No results found.  Impression/Plan: 1. Stage IV NSCLC, squamous cell carcinoma  of the right upper lobe with metastatic disease to the soft tissue of the right thigh. The patient has been doing well since completion of radiotherapy. We discussed that we would be happy to continue to follow him as needed, but he will also continue to follow up with Dr. Maretta Bees in medical oncology.    Carola Rhine, PAC

## 2017-05-16 ENCOUNTER — Ambulatory Visit: Payer: Managed Care, Other (non HMO) | Admitting: Student

## 2017-05-20 ENCOUNTER — Ambulatory Visit (HOSPITAL_COMMUNITY)
Admission: RE | Admit: 2017-05-20 | Discharge: 2017-05-20 | Disposition: A | Discharge status: Home / Self Care | Payer: Managed Care, Other (non HMO) | Source: Ambulatory Visit | Attending: Internal Medicine | Admitting: Internal Medicine

## 2017-05-20 DIAGNOSIS — C3490 Malignant neoplasm of unspecified part of unspecified bronchus or lung: Secondary | ICD-10-CM | POA: Insufficient documentation

## 2017-05-20 DIAGNOSIS — N202 Calculus of kidney with calculus of ureter: Secondary | ICD-10-CM | POA: Insufficient documentation

## 2017-05-20 DIAGNOSIS — M1711 Unilateral primary osteoarthritis, right knee: Secondary | ICD-10-CM | POA: Insufficient documentation

## 2017-05-20 DIAGNOSIS — E279 Disorder of adrenal gland, unspecified: Secondary | ICD-10-CM | POA: Insufficient documentation

## 2017-05-20 DIAGNOSIS — I700 Atherosclerosis of aorta: Secondary | ICD-10-CM | POA: Diagnosis not present

## 2017-05-20 DIAGNOSIS — N433 Hydrocele, unspecified: Secondary | ICD-10-CM | POA: Insufficient documentation

## 2017-05-20 DIAGNOSIS — I7090 Unspecified atherosclerosis: Secondary | ICD-10-CM | POA: Insufficient documentation

## 2017-05-20 MED ORDER — IOPAMIDOL (ISOVUE-300) INJECTION 61%
100.0000 mL | Freq: Once | INTRAVENOUS | Status: AC | PRN
  Administered 2017-05-20: 100 mL via INTRAVENOUS

## 2017-05-20 MED ORDER — IOPAMIDOL (ISOVUE-300) INJECTION 61%
INTRAVENOUS | Status: AC
  Filled 2017-05-20: qty 100

## 2017-05-21 ENCOUNTER — Inpatient Hospital Stay (HOSPITAL_BASED_OUTPATIENT_CLINIC_OR_DEPARTMENT_OTHER): Payer: Managed Care, Other (non HMO) | Admitting: Oncology

## 2017-05-21 ENCOUNTER — Inpatient Hospital Stay: Payer: Managed Care, Other (non HMO)

## 2017-05-21 ENCOUNTER — Encounter: Payer: Self-pay | Admitting: Oncology

## 2017-05-21 VITALS — BP 141/79 | HR 72 | Temp 97.4°F | Resp 18 | Ht 79.0 in | Wt 290.3 lb

## 2017-05-21 DIAGNOSIS — C7951 Secondary malignant neoplasm of bone: Secondary | ICD-10-CM

## 2017-05-21 DIAGNOSIS — N189 Chronic kidney disease, unspecified: Secondary | ICD-10-CM

## 2017-05-21 DIAGNOSIS — R5383 Other fatigue: Secondary | ICD-10-CM | POA: Diagnosis not present

## 2017-05-21 DIAGNOSIS — C3491 Malignant neoplasm of unspecified part of right bronchus or lung: Secondary | ICD-10-CM

## 2017-05-21 DIAGNOSIS — G620 Drug-induced polyneuropathy: Secondary | ICD-10-CM

## 2017-05-21 DIAGNOSIS — Z5111 Encounter for antineoplastic chemotherapy: Secondary | ICD-10-CM

## 2017-05-21 DIAGNOSIS — I498 Other specified cardiac arrhythmias: Secondary | ICD-10-CM

## 2017-05-21 DIAGNOSIS — E7800 Pure hypercholesterolemia, unspecified: Secondary | ICD-10-CM

## 2017-05-21 DIAGNOSIS — I129 Hypertensive chronic kidney disease with stage 1 through stage 4 chronic kidney disease, or unspecified chronic kidney disease: Secondary | ICD-10-CM

## 2017-05-21 DIAGNOSIS — Z87442 Personal history of urinary calculi: Secondary | ICD-10-CM

## 2017-05-21 DIAGNOSIS — I4892 Unspecified atrial flutter: Secondary | ICD-10-CM

## 2017-05-21 DIAGNOSIS — C3411 Malignant neoplasm of upper lobe, right bronchus or lung: Secondary | ICD-10-CM

## 2017-05-21 DIAGNOSIS — Z5112 Encounter for antineoplastic immunotherapy: Secondary | ICD-10-CM

## 2017-05-21 DIAGNOSIS — Z885 Allergy status to narcotic agent status: Secondary | ICD-10-CM

## 2017-05-21 DIAGNOSIS — M1990 Unspecified osteoarthritis, unspecified site: Secondary | ICD-10-CM | POA: Diagnosis not present

## 2017-05-21 DIAGNOSIS — J449 Chronic obstructive pulmonary disease, unspecified: Secondary | ICD-10-CM

## 2017-05-21 DIAGNOSIS — T451X5S Adverse effect of antineoplastic and immunosuppressive drugs, sequela: Secondary | ICD-10-CM

## 2017-05-21 DIAGNOSIS — Z79899 Other long term (current) drug therapy: Secondary | ICD-10-CM

## 2017-05-21 DIAGNOSIS — Z881 Allergy status to other antibiotic agents status: Secondary | ICD-10-CM

## 2017-05-21 LAB — CBC WITH DIFFERENTIAL/PLATELET
BASOS ABS: 0.1 10*3/uL (ref 0.0–0.1)
BASOS PCT: 1 %
EOS PCT: 1 %
Eosinophils Absolute: 0.1 10*3/uL (ref 0.0–0.5)
HCT: 41.6 % (ref 38.4–49.9)
Hemoglobin: 13.7 g/dL (ref 13.0–17.1)
LYMPHS PCT: 20 %
Lymphs Abs: 1.8 10*3/uL (ref 0.9–3.3)
MCH: 29.4 pg (ref 27.2–33.4)
MCHC: 32.9 g/dL (ref 32.0–36.0)
MCV: 89.3 fL (ref 79.3–98.0)
Monocytes Absolute: 1.1 10*3/uL — ABNORMAL HIGH (ref 0.1–0.9)
Monocytes Relative: 12 %
NEUTROS ABS: 6.3 10*3/uL (ref 1.5–6.5)
Neutrophils Relative %: 66 %
Platelets: 209 10*3/uL (ref 140–400)
RBC: 4.66 MIL/uL (ref 4.20–5.82)
RDW: 16.2 % — ABNORMAL HIGH (ref 11.0–14.6)
WBC: 9.4 10*3/uL (ref 4.0–10.3)

## 2017-05-21 LAB — COMPREHENSIVE METABOLIC PANEL
ALT: 28 U/L (ref 0–55)
ANION GAP: 9 (ref 3–11)
AST: 22 U/L (ref 5–34)
Albumin: 3.7 g/dL (ref 3.5–5.0)
Alkaline Phosphatase: 82 U/L (ref 40–150)
BILIRUBIN TOTAL: 0.8 mg/dL (ref 0.2–1.2)
BUN: 12 mg/dL (ref 7–26)
CALCIUM: 9.8 mg/dL (ref 8.4–10.4)
CO2: 26 mmol/L (ref 22–29)
Chloride: 103 mmol/L (ref 98–109)
Creatinine, Ser: 0.87 mg/dL (ref 0.70–1.30)
GFR calc Af Amer: >60 mL/min (ref >60)
Glucose, Bld: 104 mg/dL (ref 70–140)
Potassium: 4 mmol/L (ref 3.5–5.1)
Sodium: 138 mmol/L (ref 136–145)
TOTAL PROTEIN: 7.1 g/dL (ref 6.4–8.3)

## 2017-05-21 MED ORDER — SODIUM CHLORIDE 0.9 % IV SOLN
200.0000 mg | Freq: Once | INTRAVENOUS | Status: AC
  Administered 2017-05-21: 200 mg via INTRAVENOUS
  Filled 2017-05-21: qty 8

## 2017-05-21 MED ORDER — SODIUM CHLORIDE 0.9 % IV SOLN
Freq: Once | INTRAVENOUS | Status: AC
  Administered 2017-05-21: 12:00:00 via INTRAVENOUS

## 2017-05-21 MED ORDER — LOPERAMIDE HCL 2 MG PO CAPS
ORAL_CAPSULE | ORAL | Status: AC
  Filled 2017-05-21: qty 1

## 2017-05-21 MED ORDER — PALONOSETRON HCL INJECTION 0.25 MG/5ML
0.2500 mg | Freq: Once | INTRAVENOUS | Status: AC
  Administered 2017-05-21: 0.25 mg via INTRAVENOUS

## 2017-05-21 MED ORDER — FAMOTIDINE IN NACL 20-0.9 MG/50ML-% IV SOLN: 20.0000 mg | Freq: Once | INTRAVENOUS | Status: DC

## 2017-05-21 MED ORDER — SODIUM CHLORIDE 0.9 % IV SOLN: Freq: Once | INTRAVENOUS | Status: DC

## 2017-05-21 MED ORDER — DIPHENHYDRAMINE HCL 50 MG/ML IJ SOLN
INTRAMUSCULAR | Status: AC
  Filled 2017-05-21: qty 1

## 2017-05-21 MED ORDER — PALONOSETRON HCL INJECTION 0.25 MG/5ML
INTRAVENOUS | Status: AC
Start: 2017-05-21 — End: 2017-05-21
  Filled 2017-05-21: qty 5

## 2017-05-21 MED ORDER — SODIUM CHLORIDE 0.9 % IV SOLN
750.0000 mg | Freq: Once | INTRAVENOUS | Status: AC
  Administered 2017-05-21: 750 mg via INTRAVENOUS
  Filled 2017-05-21: qty 75

## 2017-05-21 MED ORDER — PEGFILGRASTIM 6 MG/0.6ML ~~LOC~~ PSKT
6.0000 mg | PREFILLED_SYRINGE | Freq: Once | SUBCUTANEOUS | Status: AC
  Administered 2017-05-21: 6 mg via SUBCUTANEOUS

## 2017-05-21 MED ORDER — SODIUM CHLORIDE 0.9 % IV SOLN
20.0000 mg | Freq: Once | INTRAVENOUS | Status: AC
  Administered 2017-05-21: 20 mg via INTRAVENOUS
  Filled 2017-05-21: qty 2

## 2017-05-21 MED ORDER — LOPERAMIDE HCL 2 MG PO TABS
2.0000 mg | ORAL_TABLET | Freq: Once | ORAL | Status: AC
  Administered 2017-05-21: 2 mg via ORAL
  Filled 2017-05-21: qty 1

## 2017-05-21 MED ORDER — PEGFILGRASTIM 6 MG/0.6ML ~~LOC~~ PSKT
PREFILLED_SYRINGE | SUBCUTANEOUS | Status: AC
  Filled 2017-05-21: qty 0.6

## 2017-05-21 MED ORDER — DIPHENHYDRAMINE HCL 50 MG/ML IJ SOLN
50.0000 mg | Freq: Once | INTRAMUSCULAR | Status: AC
  Administered 2017-05-21: 50 mg via INTRAVENOUS

## 2017-05-21 MED ORDER — SODIUM CHLORIDE 0.9 % IV SOLN
175.0000 mg/m2 | Freq: Once | INTRAVENOUS | Status: AC
  Administered 2017-05-21: 474 mg via INTRAVENOUS
  Filled 2017-05-21: qty 79

## 2017-05-21 NOTE — Patient Instructions (Signed)
[facility] Discharge Instructions for Patients Receiving Chemotherapy  Today you received the following chemotherapy agents:  Carboplatin, Taxol  To help prevent nausea and vomiting after your treatment, we encourage you to take your nausea medication as prescribed.   If you develop nausea and vomiting that is not controlled by your nausea medication, call the clinic.   BELOW ARE SYMPTOMS THAT SHOULD BE REPORTED IMMEDIATELY:  *FEVER GREATER THAN 100.5 F  *CHILLS WITH OR WITHOUT FEVER  NAUSEA AND VOMITING THAT IS NOT CONTROLLED WITH YOUR NAUSEA MEDICATION  *UNUSUAL SHORTNESS OF BREATH  *UNUSUAL BRUISING OR BLEEDING  TENDERNESS IN MOUTH AND THROAT WITH OR WITHOUT PRESENCE OF ULCERS  *URINARY PROBLEMS  *BOWEL PROBLEMS  UNUSUAL RASH Items with * indicate a potential emergency and should be followed up as soon as possible.  Feel free to call the clinic should you have any questions or concerns. The clinic phone number is [phone].  Please show the [facility] at check-in to the Emergency Department and triage nurse.

## 2017-05-21 NOTE — Assessment & Plan Note (Signed)
This is a very pleasant 67 year old white male with a stage IV non-small cell lung cancer, squamous cell carcinoma.  The patient is currently undergoing systemic chemotherapy with carboplatin, paclitaxel and Keytruda status post 3 cycles. He continues to tolerate this treatment fairly well with no concerning complaints. He had a recent restaging CT scan of the chest, abdomen, pelvis as well as a CT of the right femur and is here to discuss the results. The patient was seen with Dr. Julien Nordmann.  CT scan results were discussed with the patient and his wife.  CT scans show improvement of his disease.  Recommend that he proceed with cycle 4 of his treatment as scheduled today.  He will continue to have weekly labs next week and the week after.  Beginning with cycle #5, he will receive Keytruda only. The patient will have a follow-up visit in 3 weeks for evaluation prior to cycle #5.  The patient was advised to call immediately if he has any concerning symptoms in the interval. The patient voices understanding of current disease status and treatment options and is in agreement with the current care plan.  All questions were answered. The patient knows to call the clinic with any problems, questions or concerns. We can certainly see the patient much sooner if necessary.

## 2017-05-21 NOTE — Progress Notes (Signed)
[facility] OFFICE PROGRESS NOTE  Sheryle Hail, PA-C [address] [place] [address]  DIAGNOSIS: Stage IV (T2a,N3,M1c)non-small cell lung cancer, squamous cell carcinoma presented with right upper lobe lung mass in addition to right hilar and mediastinal adenopathy as well as metastatic disease in the medial right thigh diagnosed in November 2018.  PDL 1 is 60%  PRIOR THERAPY: Palliative radiotherapy to the right lower extremity mass completed April 09, 2017.  CURRENT THERAPY: Systemic chemotherapy with carboplatin for AC of 5, paclitaxel 175 mg/M2 and Keytruda 200 mg IV every 3 weeks.  First dose March 12, 2017, status post 3 cycles.  INTERVAL HISTORY: Aaron Sellers 67 y.o. male returns for routine follow-up visit accompanied by his wife.  The patient is feeling fine today with no specific complaints except for mild fatigue and the development of diarrhea since drinking the CT contrast.  He has not taken any Imodium.  The patient denies fevers and chills.  Denies chest pain, shortness breath, cough, hemoptysis.  Denies nausea, vomiting, constipation.  The patient denies any significant recent weight loss or night sweats.  He reports mild neuropathy to his feet which has improved with the use of gabapentin.  The patient is here for evaluation prior to starting cycle #4 of his treatment and to review his restaging CT scan results.  MEDICAL HISTORY: Past Medical History:  Diagnosis Date  . Arthritis   . BPH (benign prostatic hypertrophy) with urinary retention   . Chronic kidney disease    hx of kidney stones  2011  . COPD (chronic obstructive pulmonary disease) ([place])    has been smoking since he was 22 ish--  . High cholesterol   . Hypertension    dx around 2011    ALLERGIES:  is allergic to ciprofloxacin; adhesive [tape]; and codeine.  MEDICATIONS:  Current Outpatient Medications  Medication Sig Dispense Refill  . atorvastatin (LIPITOR) 10 MG  tablet Take 10 mg by mouth daily.    Marland Kitchen diltiazem (CARDIZEM CD) 300 MG 24 hr capsule Take 1 capsule (300 mg total) by mouth daily. 30 capsule 3  . diphenhydrAMINE (BENADRYL) 25 MG tablet Take 25 mg every 6 (six) hours as needed by mouth.    . famotidine (PEPCID) 10 MG tablet Take 10 mg by mouth as needed for heartburn or indigestion.    . gabapentin (NEURONTIN) 300 MG capsule Take 1 capsule (300 mg total) by mouth 3 (three) times daily. (Patient taking differently: Take 300 mg by mouth once. ) 90 capsule 1  . hydrocortisone 2.5 % cream Apply 1 application topically 2 (two) times daily as needed (skin irritations).     Marland Kitchen ibuprofen (ADVIL,MOTRIN) 200 MG tablet Take 600-800 mg by mouth every 6 (six) hours as needed for mild pain.     Marland Kitchen prochlorperazine (COMPAZINE) 10 MG tablet Take 1 tablet (10 mg total) by mouth every 6 (six) hours as needed for nausea or vomiting. 30 tablet 0  . tamsulosin (FLOMAX) 0.4 MG CAPS capsule Take 0.4 mg by mouth daily.    . traMADol (ULTRAM) 50 MG tablet Take 1-2 tablets (50-100 mg total) by mouth every 6 (six) hours as needed. 60 tablet 0   No current facility-administered medications for this visit.    Facility-Administered Medications Ordered in Other Visits  Medication Dose Route Frequency Provider Last Rate Last Dose  . 0.9 %  sodium chloride infusion   Intravenous Once Curt Bears, MD      . 0.9 %  sodium  chloride infusion   Intravenous Once Curt Bears, MD      . CARBOplatin (PARAPLATIN) 750 mg in sodium chloride 0.9 % 250 mL chemo infusion  750 mg Intravenous Once Curt Bears, MD      . heparin lock flush 100 unit/mL  500 Units Intracatheter Once PRN Curt Bears, MD      . PACLitaxel (TAXOL) 474 mg in sodium chloride 0.9 % 500 mL chemo infusion (> 59m/m2)  175 mg/m2 (Treatment Plan Recorded) Intravenous Once MCurt Bears MD      . pegfilgrastim (NEULASTA ONPRO KIT) injection 6 mg  6 mg Subcutaneous Once MCurt Bears MD      .  pegfilgrastim (NEULASTA ONPRO KIT) injection 6 mg  6 mg Subcutaneous Once MCurt Bears MD      . pembrolizumab ([facility] 200 mg in sodium chloride 0.9 % 50 mL chemo infusion  200 mg Intravenous Once MCurt Bears MD      . sodium chloride flush (NS) 0.9 % injection 10 mL  10 mL Intracatheter PRN MCurt Bears MD      . sodium chloride flush (NS) 0.9 % injection 10 mL  10 mL Intracatheter PRN MCurt Bears MD        SURGICAL HISTORY:  Past Surgical History:  Procedure Laterality Date  . HERNIA REPAIR     umbilical hernia  [id number]/8032 . INCISION AND DRAINAGE ABSCESS     "down the middle" of his buttocks  2015  . TOTAL KNEE ARTHROPLASTY Left 10/29/2014   Procedure: TOTAL KNEE ARTHROPLASTY;  Surgeon: JDorna Leitz MD;  [facility] [facility]  Service: Orthopedics;  Laterality: Left;    REVIEW OF SYSTEMS:   Review of Systems  Constitutional: Negative for appetite change, chills, fever and unexpected weight change. Positive for fatigue. HENT:   Negative for mouth sores, nosebleeds, sore throat and trouble swallowing.   Eyes: Negative for eye problems and icterus.  Respiratory: Negative for cough, hemoptysis, shortness of breath and wheezing.   Cardiovascular: Negative for chest pain and leg swelling.  Gastrointestinal: Negative for abdominal pain, constipation, nausea and vomiting. Positive for diarrhea due to CT contrast. Genitourinary: Negative for bladder incontinence, difficulty urinating, dysuria, frequency and hematuria.   Musculoskeletal: Negative for back pain, gait problem, neck pain and neck stiffness.  Skin: Negative for itching and rash.  Neurological: Negative for dizziness, extremity weakness, gait problem, headaches, light-headedness and seizures.  Hematological: Negative for adenopathy. Does not bruise/bleed easily.  Psychiatric/Behavioral: Negative for confusion, depression and sleep disturbance. The patient is not nervous/anxious.     PHYSICAL EXAMINATION:  Blood  pressure (!) 141/79, pulse 72, temperature (!) 97.4 F (36.3 C), temperature source Oral, resp. rate 18, height _0  (2.007 m), weight 290 lb 4.8 oz (131.7 kg), SpO2 98 %.  ECOG PERFORMANCE STATUS: 1 - Symptomatic but completely ambulatory  Physical Exam  Constitutional: Oriented to person, place, and time and well-developed, well-nourished, and in no distress. No distress.  HENT:  Head: Normocephalic and atraumatic.  Mouth/Throat: Oropharynx is clear and moist. No oropharyngeal exudate.  Eyes: Conjunctivae are normal. Right eye exhibits no discharge. Left eye exhibits no discharge. No scleral icterus.  Neck: Normal range of motion. Neck supple.  Cardiovascular: Normal rate, regular rhythm, normal heart sounds and intact distal pulses.   Pulmonary/Chest: Effort normal and breath sounds normal. No respiratory distress. No wheezes. No rales.  Abdominal: Soft. Bowel sounds are normal. Exhibits no distension and no mass. There is no tenderness.  Musculoskeletal: Normal range of motion. Exhibits  no edema.  Lymphadenopathy:    No cervical adenopathy.  Neurological: Alert and oriented to person, place, and time. Exhibits normal muscle tone. Gait normal. Coordination normal.  Skin: Skin is warm and dry. No rash noted. Not diaphoretic. No erythema. No pallor.  Psychiatric: Mood, memory and judgment normal.  Vitals reviewed.  LABORATORY DATA: Lab Results  Component Value Date   WBC 9.4 05/21/2017   HGB 13.7 05/21/2017   HCT 41.6 05/21/2017   MCV 89.3 05/21/2017   PLT 209 05/21/2017      Chemistry      Component Value Date/Time   NA 138 05/21/2017 1032   NA 137 04/03/2017 1030   K 4.0 05/21/2017 1032   K 4.1 04/03/2017 1030   CL 103 05/21/2017 1032   CO2 26 05/21/2017 1032   CO2 27 04/03/2017 1030   BUN 12 05/21/2017 1032   BUN 15.5 04/03/2017 1030   CREATININE 0.87 05/21/2017 1032   CREATININE 0.8 04/03/2017 1030      Component Value Date/Time   CALCIUM 9.8 05/21/2017 1032    CALCIUM 9.4 04/03/2017 1030   ALKPHOS 82 05/21/2017 1032   ALKPHOS 97 04/03/2017 1030   AST 22 05/21/2017 1032   AST 14 04/03/2017 1030   ALT 28 05/21/2017 1032   ALT 21 04/03/2017 1030   BILITOT 0.8 05/21/2017 1032   BILITOT 0.67 04/03/2017 1030       RADIOGRAPHIC STUDIES:  Ct Chest W Contrast  Result Date: 05/20/2017 CLINICAL DATA:  Non-small-cell lung cancer with metastatic disease to the right thigh. Post radiation therapy. EXAM: CT CHEST, ABDOMEN, AND PELVIS WITH CONTRAST TECHNIQUE: Multidetector CT imaging of the chest, abdomen and pelvis was performed following the standard protocol during bolus administration of intravenous contrast. CONTRAST:  151m ISOVUE-300 IOPAMIDOL (ISOVUE-300) INJECTION 61% COMPARISON:  PET-CT 02/15/2017. Abdominopelvic CT 01/26/2017 and chest CT 02/27/2017. FINDINGS: CT CHEST FINDINGS Cardiovascular: No acute vascular findings are seen. There is atherosclerosis of the aorta, great vessels and coronary arteries. The heart size is normal. There is no pericardial effusion. Mediastinum/Nodes: Previously demonstrated right paratracheal and right hilar adenopathy has improved. Right hilar node measures 2.5 x 1.6 cm on image 26 (previously 3.7 x 3.0 cm). 13 mm short axis subcarinal node on image 27 previously measured 14 mm. There is no axillary adenopathy. The thyroid gland, trachea and esophagus demonstrate no significant findings. Lungs/Pleura: There is no pleural effusion. Interval decreased size and cavitation of dominant right upper lobe mass, now measuring 2.2 x 2.0 cm on image 37 (previously 3.3 x 2.7 cm). Tiny left lung nodules on images 31 and 61 are stable. No new or enlarging pulmonary nodules. Musculoskeletal/Chest wall: No chest wall mass or suspicious osseous findings. CT ABDOMEN AND PELVIS FINDINGS Hepatobiliary: The liver is normal in density without focal abnormality. No evidence of gallstones, gallbladder wall thickening or biliary dilatation. Pancreas:  Unremarkable. No pancreatic ductal dilatation or surrounding inflammatory changes. Spleen: Normal in size without focal abnormality. Adrenals/Urinary Tract: 3.7 x 2.1 cm right adrenal nodule on image 59 appears unchanged from the baseline study (as remeasured). The left adrenal gland appears normal. There are small nonobstructing bilateral renal calculi. There is an obstructing calculus in the distal right ureter, measuring 5 mm on image 120. This may reflect 2 adjacent stones based on the reformatted images, lying just proximal to the ureterovesical junction. There is mild associated ureterectasis, periureteral soft tissue stranding and delayed contrast excretion. A cyst in the interpolar region of the right kidney appears unchanged. The  bladder appears normal. Stomach/Bowel: No evidence of bowel wall thickening, distention or surrounding inflammatory change. The inflammation of the sigmoid colon in the right lower quadrant on the prior study has resolved. The appendix appears normal. Vascular/Lymphatic: There are stable mildly prominent lymph nodes in the porta hepatis and gastrohepatic ligament. There is aortic and branch vessel atherosclerosis with stable mild enlargement of the infrarenal abdominal aorta. No acute vascular findings. Reproductive: The prostate gland appears stable. Other: No evidence of abdominal wall hernia or mass. No ascites or peritoneal nodularity. Musculoskeletal: No acute or significant osseous findings. Right lower extremity findings dictated separately. IMPRESSION: 1. Interval response to therapy in the thoracic findings. The dominant right upper lobe mass has decreased in size and become cavitary. The right hilar and mediastinal adenopathy has improved. 2. Stable right adrenal nodule, likely an adenoma. No signs of metastatic disease in the abdomen or pelvis. 3. Bilateral renal calculi with development of a partially obstructing distal right ureteral calculus or calculi. 4. Interval  resolution of sigmoid diverticulitis. Aortic Atherosclerosis (ICD10-I70.0). 5. These results will be called to the ordering clinician or representative by the Radiologist Assistant, and communication documented in the PACS or zVision Dashboard. Electronically Signed   By: Richardean Sale M.D.   On: 05/20/2017 14:13   Ct Abdomen Pelvis W Contrast  Result Date: 05/20/2017 CLINICAL DATA:  Non-small-cell lung cancer with metastatic disease to the right thigh. Post radiation therapy. EXAM: CT CHEST, ABDOMEN, AND PELVIS WITH CONTRAST TECHNIQUE: Multidetector CT imaging of the chest, abdomen and pelvis was performed following the standard protocol during bolus administration of intravenous contrast. CONTRAST:  161m ISOVUE-300 IOPAMIDOL (ISOVUE-300) INJECTION 61% COMPARISON:  PET-CT 02/15/2017. Abdominopelvic CT 01/26/2017 and chest CT 02/27/2017. FINDINGS: CT CHEST FINDINGS Cardiovascular: No acute vascular findings are seen. There is atherosclerosis of the aorta, great vessels and coronary arteries. The heart size is normal. There is no pericardial effusion. Mediastinum/Nodes: Previously demonstrated right paratracheal and right hilar adenopathy has improved. Right hilar node measures 2.5 x 1.6 cm on image 26 (previously 3.7 x 3.0 cm). 13 mm short axis subcarinal node on image 27 previously measured 14 mm. There is no axillary adenopathy. The thyroid gland, trachea and esophagus demonstrate no significant findings. Lungs/Pleura: There is no pleural effusion. Interval decreased size and cavitation of dominant right upper lobe mass, now measuring 2.2 x 2.0 cm on image 37 (previously 3.3 x 2.7 cm). Tiny left lung nodules on images 31 and 61 are stable. No new or enlarging pulmonary nodules. Musculoskeletal/Chest wall: No chest wall mass or suspicious osseous findings. CT ABDOMEN AND PELVIS FINDINGS Hepatobiliary: The liver is normal in density without focal abnormality. No evidence of gallstones, gallbladder wall  thickening or biliary dilatation. Pancreas: Unremarkable. No pancreatic ductal dilatation or surrounding inflammatory changes. Spleen: Normal in size without focal abnormality. Adrenals/Urinary Tract: 3.7 x 2.1 cm right adrenal nodule on image 59 appears unchanged from the baseline study (as remeasured). The left adrenal gland appears normal. There are small nonobstructing bilateral renal calculi. There is an obstructing calculus in the distal right ureter, measuring 5 mm on image 120. This may reflect 2 adjacent stones based on the reformatted images, lying just proximal to the ureterovesical junction. There is mild associated ureterectasis, periureteral soft tissue stranding and delayed contrast excretion. A cyst in the interpolar region of the right kidney appears unchanged. The bladder appears normal. Stomach/Bowel: No evidence of bowel wall thickening, distention or surrounding inflammatory change. The inflammation of the sigmoid colon in the right lower  quadrant on the prior study has resolved. The appendix appears normal. Vascular/Lymphatic: There are stable mildly prominent lymph nodes in the porta hepatis and gastrohepatic ligament. There is aortic and branch vessel atherosclerosis with stable mild enlargement of the infrarenal abdominal aorta. No acute vascular findings. Reproductive: The prostate gland appears stable. Other: No evidence of abdominal wall hernia or mass. No ascites or peritoneal nodularity. Musculoskeletal: No acute or significant osseous findings. Right lower extremity findings dictated separately. IMPRESSION: 1. Interval response to therapy in the thoracic findings. The dominant right upper lobe mass has decreased in size and become cavitary. The right hilar and mediastinal adenopathy has improved. 2. Stable right adrenal nodule, likely an adenoma. No signs of metastatic disease in the abdomen or pelvis. 3. Bilateral renal calculi with development of a partially obstructing distal right  ureteral calculus or calculi. 4. Interval resolution of sigmoid diverticulitis. Aortic Atherosclerosis (ICD10-I70.0). 5. These results will be called to the ordering clinician or representative by the Radiologist Assistant, and communication documented in the PACS or zVision Dashboard. Electronically Signed   By: Richardean Sale M.D.   On: 05/20/2017 14:13   Ct Femur Right W Contrast  Result Date: 05/20/2017 CLINICAL DATA:  The patient reports a palpable lesion on the right upper leg. History of lung carcinoma. EXAM: CT OF THE LOWER RIGHT EXTREMITY WITH CONTRAST TECHNIQUE: Multidetector CT imaging of the lower right extremity was performed according to the standard protocol following intravenous contrast administration. COMPARISON:  None. CONTRAST:  100 cc Isovue-300. FINDINGS: Bones/Joint/Cartilage No acute abnormality is identified. No focal lesion is seen. There is some degenerative change about the knee appearing most notable in the medial compartment. Ligaments Suboptimally assessed by CT. Muscles and Tendons Appear normal. No intramuscular mass or fluid collection. No tear or strain is seen. Soft tissues Negative for mass or fluid collection. Atherosclerotic vascular disease noted. Bilateral hydroceles are seen, larger on the right. IMPRESSION: Negative for mass or fluid collection. No finding to explain the patient's palpated abnormality is identified. Atherosclerosis. Right greater than left hydroceles. Osteoarthritis right knee. Electronically Signed   By: Inge Rise M.D.   On: 05/20/2017 12:43     ASSESSMENT/PLAN:  Stage IV squamous cell carcinoma of right lung [facility]) This is a very pleasant 67 year old white male with a stage IV non-small cell lung cancer, squamous cell carcinoma.  The patient is currently undergoing systemic chemotherapy with carboplatin, paclitaxel and Keytruda status post 3 cycles. He continues to tolerate this treatment fairly well with no concerning complaints. He  had a recent restaging CT scan of the chest, abdomen, pelvis as well as a CT of the right femur and is here to discuss the results. The patient was seen with Dr. Julien Nordmann.  CT scan results were discussed with the patient and his wife.  CT scans show improvement of his disease.  Recommend that he proceed with cycle 4 of his treatment as scheduled today.  He will continue to have weekly labs next week and the week after.  Beginning with cycle #5, he will receive Keytruda only. The patient will have a follow-up visit in 3 weeks for evaluation prior to cycle #5.  The patient was advised to call immediately if he has any concerning symptoms in the interval. The patient voices understanding of current disease status and treatment options and is in agreement with the current care plan.  All questions were answered. The patient knows to call the clinic with any problems, questions or concerns. We can  certainly see the patient much sooner if necessary.  No orders of the defined types were placed in this encounter.   Mikey Bussing, DNP, AGPCNP-BC, AOCNP 05/21/17  ADDENDUM: Hematology/Oncology Attending: I had a face-to-face encounter with the patient.  I recommended his care plan.  This is a very pleasant 67 years old white male with a stage IV non-small cell lung cancer, squamous cell carcinoma status post 3 cycles of systemic chemotherapy with carboplatin, paclitaxel and Keytruda.  He is tolerating this treatment very well with no concerning complaints.  The patient had repeat CT scan of the chest, abdomen and pelvis as well as CT scan of the right femur for restaging of his disease.  I personally and independently reviewed the scans and discussed the results with the patient and his wife.  His a scan showed improvement of his disease was almost resolution of the mass in the right femur and decrease in his lung mass and mediastinal lymphadenopathy. I recommended for the patient to continue his current  treatment with systemic chemotherapy with carboplatin, paclitaxel and Keytruda for cycle #4.  Starting from cycle #5 the patient will be treated with single agent Keytruda as maintenance therapy. He will come back for follow-up visit in 3 weeks for evaluation before starting the next cycle of his treatment. The patient was advised to call immediately if he has any concerning symptoms in the interval.  Disclaimer: This note was dictated with voice recognition software. Similar sounding words can inadvertently be transcribed and may be missed upon review. Eilleen Kempf, MD 05/22/17

## 2017-05-22 ENCOUNTER — Telehealth: Payer: Self-pay | Admitting: Oncology

## 2017-05-22 NOTE — Telephone Encounter (Signed)
Scheduled appt per 2/19 los - Patient to get an updated schedule next visit.

## 2017-05-28 ENCOUNTER — Ambulatory Visit (HOSPITAL_COMMUNITY)
Admission: RE | Admit: 2017-05-28 | Discharge: 2017-05-28 | Disposition: A | Discharge status: Home / Self Care | Payer: Managed Care, Other (non HMO) | Source: Ambulatory Visit | Attending: Medical | Admitting: Medical

## 2017-05-28 ENCOUNTER — Inpatient Hospital Stay (HOSPITAL_COMMUNITY)
Admission: EM | Admit: 2017-05-28 | Discharge: 2017-06-03 | DRG: 392 | Disposition: A | Discharge status: Home / Self Care | Payer: Managed Care, Other (non HMO) | Attending: Internal Medicine | Admitting: Internal Medicine

## 2017-05-28 ENCOUNTER — Other Ambulatory Visit (HOSPITAL_COMMUNITY): Payer: Self-pay

## 2017-05-28 ENCOUNTER — Inpatient Hospital Stay (HOSPITAL_BASED_OUTPATIENT_CLINIC_OR_DEPARTMENT_OTHER): Payer: Managed Care, Other (non HMO) | Admitting: Medical

## 2017-05-28 ENCOUNTER — Encounter (HOSPITAL_COMMUNITY): Payer: Self-pay

## 2017-05-28 ENCOUNTER — Encounter (HOSPITAL_COMMUNITY): Payer: Self-pay | Admitting: Obstetrics and Gynecology

## 2017-05-28 ENCOUNTER — Inpatient Hospital Stay: Payer: Managed Care, Other (non HMO) | Admitting: Medical

## 2017-05-28 ENCOUNTER — Telehealth: Payer: Self-pay | Admitting: Medical Oncology

## 2017-05-28 ENCOUNTER — Other Ambulatory Visit: Payer: Self-pay

## 2017-05-28 VITALS — BP 140/80 | HR 75 | Temp 97.8°F | Resp 18 | Ht 79.0 in | Wt 289.6 lb

## 2017-05-28 DIAGNOSIS — R1031 Right lower quadrant pain: Secondary | ICD-10-CM

## 2017-05-28 DIAGNOSIS — C3491 Malignant neoplasm of unspecified part of right bronchus or lung: Secondary | ICD-10-CM

## 2017-05-28 DIAGNOSIS — Z881 Allergy status to other antibiotic agents status: Secondary | ICD-10-CM

## 2017-05-28 DIAGNOSIS — K5780 Diverticulitis of intestine, part unspecified, with perforation and abscess without bleeding: Secondary | ICD-10-CM | POA: Diagnosis present

## 2017-05-28 DIAGNOSIS — I700 Atherosclerosis of aorta: Secondary | ICD-10-CM | POA: Insufficient documentation

## 2017-05-28 DIAGNOSIS — E7800 Pure hypercholesterolemia, unspecified: Secondary | ICD-10-CM | POA: Diagnosis not present

## 2017-05-28 DIAGNOSIS — K631 Perforation of intestine (nontraumatic): Secondary | ICD-10-CM

## 2017-05-28 DIAGNOSIS — N189 Chronic kidney disease, unspecified: Secondary | ICD-10-CM | POA: Diagnosis present

## 2017-05-28 DIAGNOSIS — I10 Essential (primary) hypertension: Secondary | ICD-10-CM | POA: Diagnosis present

## 2017-05-28 DIAGNOSIS — K668 Other specified disorders of peritoneum: Secondary | ICD-10-CM | POA: Diagnosis present

## 2017-05-28 DIAGNOSIS — K37 Unspecified appendicitis: Secondary | ICD-10-CM | POA: Diagnosis present

## 2017-05-28 DIAGNOSIS — Z5189 Encounter for other specified aftercare: Secondary | ICD-10-CM | POA: Diagnosis not present

## 2017-05-28 DIAGNOSIS — I471 Supraventricular tachycardia: Secondary | ICD-10-CM | POA: Diagnosis present

## 2017-05-28 DIAGNOSIS — R402413 Glasgow coma scale score 13-15, at hospital admission: Secondary | ICD-10-CM | POA: Diagnosis present

## 2017-05-28 DIAGNOSIS — N132 Hydronephrosis with renal and ureteral calculous obstruction: Secondary | ICD-10-CM

## 2017-05-28 DIAGNOSIS — Z5111 Encounter for antineoplastic chemotherapy: Secondary | ICD-10-CM | POA: Diagnosis not present

## 2017-05-28 DIAGNOSIS — Z885 Allergy status to narcotic agent status: Secondary | ICD-10-CM

## 2017-05-28 DIAGNOSIS — I129 Hypertensive chronic kidney disease with stage 1 through stage 4 chronic kidney disease, or unspecified chronic kidney disease: Secondary | ICD-10-CM | POA: Diagnosis not present

## 2017-05-28 DIAGNOSIS — Z96652 Presence of left artificial knee joint: Secondary | ICD-10-CM | POA: Diagnosis present

## 2017-05-28 DIAGNOSIS — I340 Nonrheumatic mitral (valve) insufficiency: Secondary | ICD-10-CM | POA: Diagnosis not present

## 2017-05-28 DIAGNOSIS — R300 Dysuria: Secondary | ICD-10-CM | POA: Insufficient documentation

## 2017-05-28 DIAGNOSIS — R339 Retention of urine, unspecified: Secondary | ICD-10-CM | POA: Diagnosis present

## 2017-05-28 DIAGNOSIS — C7989 Secondary malignant neoplasm of other specified sites: Secondary | ICD-10-CM | POA: Diagnosis present

## 2017-05-28 DIAGNOSIS — N202 Calculus of kidney with calculus of ureter: Secondary | ICD-10-CM | POA: Diagnosis present

## 2017-05-28 DIAGNOSIS — I4891 Unspecified atrial fibrillation: Secondary | ICD-10-CM | POA: Diagnosis present

## 2017-05-28 DIAGNOSIS — M1990 Unspecified osteoarthritis, unspecified site: Secondary | ICD-10-CM | POA: Diagnosis present

## 2017-05-28 DIAGNOSIS — C3411 Malignant neoplasm of upper lobe, right bronchus or lung: Secondary | ICD-10-CM

## 2017-05-28 DIAGNOSIS — T451X5S Adverse effect of antineoplastic and immunosuppressive drugs, sequela: Secondary | ICD-10-CM | POA: Diagnosis not present

## 2017-05-28 DIAGNOSIS — R5383 Other fatigue: Secondary | ICD-10-CM

## 2017-05-28 DIAGNOSIS — D3501 Benign neoplasm of right adrenal gland: Secondary | ICD-10-CM

## 2017-05-28 DIAGNOSIS — N401 Enlarged prostate with lower urinary tract symptoms: Secondary | ICD-10-CM | POA: Diagnosis present

## 2017-05-28 DIAGNOSIS — Z79899 Other long term (current) drug therapy: Secondary | ICD-10-CM

## 2017-05-28 DIAGNOSIS — G620 Drug-induced polyneuropathy: Secondary | ICD-10-CM

## 2017-05-28 DIAGNOSIS — K7460 Unspecified cirrhosis of liver: Secondary | ICD-10-CM

## 2017-05-28 DIAGNOSIS — Z5112 Encounter for antineoplastic immunotherapy: Secondary | ICD-10-CM | POA: Diagnosis not present

## 2017-05-28 DIAGNOSIS — I4892 Unspecified atrial flutter: Secondary | ICD-10-CM

## 2017-05-28 DIAGNOSIS — K5732 Diverticulitis of large intestine without perforation or abscess without bleeding: Secondary | ICD-10-CM

## 2017-05-28 DIAGNOSIS — E278 Other specified disorders of adrenal gland: Secondary | ICD-10-CM | POA: Diagnosis present

## 2017-05-28 DIAGNOSIS — E785 Hyperlipidemia, unspecified: Secondary | ICD-10-CM | POA: Diagnosis present

## 2017-05-28 DIAGNOSIS — C7951 Secondary malignant neoplasm of bone: Secondary | ICD-10-CM | POA: Diagnosis not present

## 2017-05-28 DIAGNOSIS — Z87891 Personal history of nicotine dependence: Secondary | ICD-10-CM | POA: Diagnosis not present

## 2017-05-28 DIAGNOSIS — E669 Obesity, unspecified: Secondary | ICD-10-CM | POA: Diagnosis present

## 2017-05-28 DIAGNOSIS — Z87442 Personal history of urinary calculi: Secondary | ICD-10-CM | POA: Diagnosis not present

## 2017-05-28 DIAGNOSIS — Z91040 Latex allergy status: Secondary | ICD-10-CM | POA: Diagnosis not present

## 2017-05-28 DIAGNOSIS — Z7689 Persons encountering health services in other specified circumstances: Secondary | ICD-10-CM | POA: Diagnosis not present

## 2017-05-28 DIAGNOSIS — J449 Chronic obstructive pulmonary disease, unspecified: Secondary | ICD-10-CM | POA: Diagnosis present

## 2017-05-28 DIAGNOSIS — K760 Fatty (change of) liver, not elsewhere classified: Secondary | ICD-10-CM

## 2017-05-28 DIAGNOSIS — I498 Other specified cardiac arrhythmias: Secondary | ICD-10-CM | POA: Diagnosis not present

## 2017-05-28 HISTORY — DX: Unspecified atrial flutter: I48.92

## 2017-05-28 HISTORY — DX: Malignant (primary) neoplasm, unspecified: C80.1

## 2017-05-28 HISTORY — DX: Malignant neoplasm of unspecified part of right bronchus or lung: C34.91

## 2017-05-28 LAB — CBC WITH DIFFERENTIAL/PLATELET
Basophils Absolute: 0 10*3/uL (ref 0.0–0.1)
Basophils Relative: 1 %
EOS PCT: 2 %
Eosinophils Absolute: 0.2 10*3/uL (ref 0.0–0.5)
HCT: 38.1 % — ABNORMAL LOW (ref 38.4–49.9)
Hemoglobin: 12.8 g/dL — ABNORMAL LOW (ref 13.0–17.1)
LYMPHS ABS: 1 10*3/uL (ref 0.9–3.3)
LYMPHS PCT: 12 %
MCH: 29.5 pg (ref 27.2–33.4)
MCHC: 33.5 g/dL (ref 32.0–36.0)
MCV: 88 fL (ref 79.3–98.0)
MONO ABS: 1 10*3/uL — AB (ref 0.1–0.9)
Monocytes Relative: 12 %
Neutro Abs: 6 10*3/uL (ref 1.5–6.5)
Neutrophils Relative %: 73 %
PLATELETS: 132 10*3/uL — AB (ref 140–400)
RBC: 4.33 MIL/uL (ref 4.20–5.82)
RDW: 17.7 % — AB (ref 11.0–14.6)
WBC: 8.2 10*3/uL (ref 4.0–10.3)

## 2017-05-28 LAB — COMPREHENSIVE METABOLIC PANEL
ALBUMIN: 3.2 g/dL — AB (ref 3.5–5.0)
ALT: 35 U/L (ref 0–55)
AST: 24 U/L (ref 5–34)
Alkaline Phosphatase: 122 U/L (ref 40–150)
Anion gap: 8 (ref 3–11)
BUN: 14 mg/dL (ref 7–26)
CHLORIDE: 101 mmol/L (ref 98–109)
CO2: 28 mmol/L (ref 22–29)
Calcium: 9.4 mg/dL (ref 8.4–10.4)
Creatinine, Ser: 0.8 mg/dL (ref 0.70–1.30)
GFR calc Af Amer: >60 mL/min (ref >60)
GFR calc non Af Amer: >60 mL/min (ref >60)
GLUCOSE: 135 mg/dL (ref 70–140)
POTASSIUM: 4.1 mmol/L (ref 3.5–5.1)
Sodium: 137 mmol/L (ref 136–145)
Total Bilirubin: 1 mg/dL (ref 0.2–1.2)
Total Protein: 6.7 g/dL (ref 6.4–8.3)

## 2017-05-28 LAB — URINALYSIS, COMPLETE (UACMP) WITH MICROSCOPIC
Bilirubin Urine: NEGATIVE
Glucose, UA: NEGATIVE mg/dL
KETONES UR: NEGATIVE mg/dL
Leukocytes, UA: NEGATIVE
Nitrite: NEGATIVE
PROTEIN: NEGATIVE mg/dL
Specific Gravity, Urine: 1.025 (ref 1.005–1.030)
pH: 5 (ref 5.0–8.0)

## 2017-05-28 MED ORDER — DILTIAZEM LOAD VIA INFUSION
10.0000 mg | Freq: Once | INTRAVENOUS | Status: AC
  Administered 2017-05-28: 10 mg via INTRAVENOUS
  Filled 2017-05-28: qty 10

## 2017-05-28 MED ORDER — ADENOSINE 6 MG/2ML IV SOLN
INTRAVENOUS | Status: AC
  Administered 2017-05-28: 6 mg
  Filled 2017-05-28: qty 2

## 2017-05-28 MED ORDER — ONDANSETRON HCL 4 MG PO TABS: 4.0000 mg | ORAL_TABLET | Freq: Four times a day (QID) | ORAL | Status: DC | PRN

## 2017-05-28 MED ORDER — DILTIAZEM LOAD VIA INFUSION: 10.0000 mg | Freq: Once | INTRAVENOUS | Status: DC

## 2017-05-28 MED ORDER — SODIUM CHLORIDE 0.9 % IV SOLN
INTRAVENOUS | Status: AC
  Administered 2017-05-28 (×2): via INTRAVENOUS

## 2017-05-28 MED ORDER — ADENOSINE 6 MG/2ML IV SOLN
INTRAVENOUS | Status: AC
  Administered 2017-05-28: 6 mg
  Filled 2017-05-28: qty 6

## 2017-05-28 MED ORDER — PIPERACILLIN-TAZOBACTAM 3.375 G IVPB
3.3750 g | Freq: Three times a day (TID) | INTRAVENOUS | Status: DC
  Administered 2017-05-28 – 2017-05-31 (×8): 3.375 g via INTRAVENOUS
  Filled 2017-05-28 (×8): qty 50

## 2017-05-28 MED ORDER — DILTIAZEM HCL-DEXTROSE 100-5 MG/100ML-% IV SOLN (PREMIX)
5.0000 mg/h | INTRAVENOUS | Status: DC
  Administered 2017-05-28: 5 mg/h via INTRAVENOUS
  Administered 2017-05-29 – 2017-05-30 (×3): 7.5 mg/h via INTRAVENOUS
  Filled 2017-05-28 (×4): qty 100

## 2017-05-28 MED ORDER — PIPERACILLIN-TAZOBACTAM 3.375 G IVPB 30 MIN
3.3750 g | Freq: Once | INTRAVENOUS | Status: AC
  Administered 2017-05-28: 3.375 g via INTRAVENOUS
  Filled 2017-05-28: qty 50

## 2017-05-28 MED ORDER — ONDANSETRON HCL 4 MG/2ML IJ SOLN: 4.0000 mg | Freq: Four times a day (QID) | INTRAMUSCULAR | Status: DC | PRN

## 2017-05-28 NOTE — Consult Note (Addendum)
Reason for Consult:  Diverticulitis vs appendicitis with perfortation CC:  2 days of RLQ pain, some nausea, no vomiting Referring Physician: Hillard Danker PCP:  Sheryle Hail, PA-C Oncology: Dr. Curt Bears  Aaron Sellers is an 67 y.o. male.  HPI: Patient is a stage IV(T2a,N3,M1c) non-small cell squamous cell carcinoma with a right upper lobe mass, right hilar and mediastinal adenopathy and right medial thigh metastasis.  He is undergoing chemotherapy and his last treatment was on 05/21/17.  He had previously undergone CT scan on 05/20/17.  This showed interval response to the right upper lobe mass which has decreased in size right hilar mediastinal adenopathy had improved.  He had a stable right adrenal nodule with no signs of metastasis in the abdomen or pelvis.  He had bilateral renal calculi;  development  of partial obstructing distal right ureteral calculus.  Interval resolution of prior sigmoid diverticulitis.  Right femur CT was negative for mass or fluid collection right greater than left hydroceles, and osteoarthritis of the right knee.  He was seen at the cancer center today with abdominal discomfort and was referred to the emergency department.  He reported 2 days of discomfort becoming progressively worse.  Pain was in the right lower quadrant.  Pain was moderate.  CT from 01/26/17 showed focal wall thickening and moderate surrounding inflammation involving the distal sigmoid with diverticuli present consistent with acute diverticulitis.  There was some suspected extraluminal gas collection adjacent to the inflamed sigmoid colon concerning for focal contained perforation but no free air or abscess.  He was admitted to [facility] at that time. In addition to his diverticulitis with microperforation he was having a tachyarrhythmia which was treated with adenosine for possible atrial fibrillation/flutter.  This was then controlled with Cardizem and he transition to sinus rhythm.  He was  found to have the lung cancer on this admission 12/2016.  Workup in the ED today shows he is afebrile and his vital signs are stable.  BMP is essentially normal.  WBC is 8.2, hemoglobin 12.8, hematocrit is 38.1.  Platelets are 132,000.  Urinalysis is unremarkable.  CT scan done without contrast today shows an inflammatory process in the right lower quadrant of the abdomen adjacent to the appendix and several diverticuli in the mid sigmoid colon they cannot tell whether this was an acute diverticulitis or acute appendicitis.  There was extensive inflammatory changes.  A small amount of pneumoperitoneum. On exam he currently has minimal tenderness and says it feels better now.   We are asked to see.  Past Medical History:  Diagnosis Date  . Arthritis   . Atrial flutter ([place])   . BPH (benign prostatic hypertrophy) with urinary retention   . Chronic kidney disease    hx of kidney stones  2011  . COPD (chronic obstructive pulmonary disease) ([place])    has been smoking since he was 22 ish--  . High cholesterol   . Hypertension    dx around 2011    Past Surgical History:  Procedure Laterality Date  . HERNIA REPAIR     umbilical hernia  05/2334 - In [place], [place]  . INCISION AND DRAINAGE ABSCESS     "down the middle" of his buttocks  2015  . TOTAL KNEE ARTHROPLASTY Left 10/29/2014   Procedure: TOTAL KNEE ARTHROPLASTY;  Surgeon: Dorna Leitz, MD;  Location: [facility];  Service: Orthopedics;  Laterality: Left;    Family History  Problem Relation Age of Onset  . Hypertension Mother   .  CVA Father   . Lung cancer Sister   . Lung cancer Brother   . Hypertension Brother   . Hypertension Sister     Social History:  reports that he quit smoking about 3 months ago. His smoking use included cigarettes. He has a 40.00 pack-year smoking history. He has quit using smokeless tobacco. His smokeless tobacco use included snuff. He reports that he does not drink alcohol or use drugs.  Allergies:  Allergies   Allergen Reactions  . Ciprofloxacin     HIVES  . Adhesive [Tape] Other (See Comments)    Skin peels  . Codeine Other (See Comments)    Jitters, "skin crawling"    Prior to Admission medications   Medication Sig Start Date End Date Taking? Authorizing Provider  acetaminophen (TYLENOL) 500 MG tablet Take 1,000 mg by mouth every 6 (six) hours as needed for moderate pain.   Yes [provider]  atorvastatin (LIPITOR) 10 MG tablet Take 10 mg by mouth every evening.    Yes [provider]  Cyanocobalamin (VITAMIN B-12 PO) Take 1 tablet by mouth daily.   Yes [provider]  diltiazem (CARDIZEM CD) 300 MG 24 hr capsule Take 1 capsule (300 mg total) by mouth daily. 04/24/17  Yes BranchAlphonse Guild, MD  famotidine (PEPCID) 10 MG tablet Take 10 mg by mouth as needed for heartburn or indigestion.   Yes [provider]  gabapentin (NEURONTIN) 300 MG capsule Take 1 capsule (300 mg total) by mouth 3 (three) times daily. Patient taking differently: Take 300 mg by mouth 2 (two) times daily as needed (pain).  05/14/17  Yes Hayden Pedro, PA-C  ibuprofen (ADVIL,MOTRIN) 200 MG tablet Take 400 mg by mouth every 6 (six) hours as needed for mild pain or moderate pain.    Yes [provider]  Loratadine-Pseudoephedrine (CLARITIN-D 12 HOUR PO) Take 1 tablet by mouth 2 (two) times daily.   Yes [provider]  tamsulosin (FLOMAX) 0.4 MG CAPS capsule Take 0.4 mg by mouth daily.   Yes [provider]  diphenhydrAMINE (BENADRYL) 25 MG tablet Take 25 mg by mouth every 6 (six) hours as needed for itching or allergies.     [provider]  prochlorperazine (COMPAZINE) 10 MG tablet Take 1 tablet (10 mg total) by mouth every 6 (six) hours as needed for nausea or vomiting. 02/27/17   Curt Bears, MD  traMADol (ULTRAM) 50 MG tablet Take 1-2 tablets (50-100 mg total) by mouth every 6 (six) hours as needed. Patient taking differently: Take  50-100 mg by mouth every 6 (six) hours as needed for moderate pain.  03/25/17   Hayden Pedro, PA-C      Results for orders placed or performed in visit on 05/28/17 (from the past 48 hour(s))  Comprehensive metabolic panel     Status: Abnormal   Collection Time: 05/28/17 11:06 AM  Result Value Ref Range   Sodium 137 136 - 145 mmol/L   Potassium 4.1 3.5 - 5.1 mmol/L   Chloride 101 98 - 109 mmol/L   CO2 28 22 - 29 mmol/L   Glucose, Bld 135 70 - 140 mg/dL   BUN 14 7 - 26 mg/dL   Creatinine, Ser 0.80 0.70 - 1.30 mg/dL   Calcium 9.4 8.4 - 10.4 mg/dL   Total Protein 6.7 6.4 - 8.3 g/dL   Albumin 3.2 (L) 3.5 - 5.0 g/dL   AST 24 5 - 34 U/L   ALT 35 0 - 55  U/L   Alkaline Phosphatase 122 40 - 150 U/L   Total Bilirubin 1.0 0.2 - 1.2 mg/dL   GFR calc non Af Amer >60 >60 mL/min   GFR calc Af Amer >60 >60 mL/min    Comment: (NOTE) The eGFR has been calculated using the CKD EPI equation. This calculation has not been validated in all clinical situations. eGFR's persistently <60 mL/min signify possible Chronic Kidney Disease.    Anion gap 8 3 - 11    Comment: Performed at [facility], [address], [place], [place] [address]  CBC with Differential     Status: Abnormal   Collection Time: 05/28/17 11:06 AM  Result Value Ref Range   WBC 8.2 4.0 - 10.3 K/uL   RBC 4.33 4.20 - 5.82 MIL/uL   Hemoglobin 12.8 (L) 13.0 - 17.1 g/dL   HCT 38.1 (L) 38.4 - 49.9 %   MCV 88.0 79.3 - 98.0 fL   MCH 29.5 27.2 - 33.4 pg   MCHC 33.5 32.0 - 36.0 g/dL   RDW 17.7 (H) 11.0 - 14.6 %   Platelets 132 (L) 140 - 400 K/uL   Neutrophils Relative % 73 %   Neutro Abs 6.0 1.5 - 6.5 K/uL   Lymphocytes Relative 12 %   Lymphs Abs 1.0 0.9 - 3.3 K/uL   Monocytes Relative 12 %   Monocytes Absolute 1.0 (H) 0.1 - 0.9 K/uL   Eosinophils Relative 2 %   Eosinophils Absolute 0.2 0.0 - 0.5 K/uL   Basophils Relative 1 %   Basophils Absolute 0.0 0.0 - 0.1 K/uL    Comment: Performed at [facility], [place] [address], [place], [place] [address]    Ct Renal Stone Study  Result Date: 05/28/2017 CLINICAL DATA:  67 year old male with history of right-sided groin and back pain since 05/25/2017. Gross hematuria. EXAM: CT ABDOMEN AND PELVIS WITHOUT CONTRAST TECHNIQUE: Multidetector CT imaging of the abdomen and pelvis was performed following the standard protocol without IV contrast. COMPARISON:  CT the chest, abdomen and pelvis 05/20/2017. FINDINGS: Lower chest: Subtle areas of septal thickening throughout the visualize lung bases. Atherosclerotic calcifications in the left anterior descending and right coronary arteries. Hepatobiliary: Mild diffuse low attenuation throughout the hepatic parenchyma, compatible with a background of hepatic steatosis. Liver has a slightly shrunken appearance and nodular contour, indicative of underlying cirrhosis. No definite cystic or solid hepatic lesions are confidently identified on today's noncontrast CT examination. Unenhanced appearance of the gallbladder is normal. Pancreas: No definite pancreatic mass or peripancreatic fluid or inflammatory changes are noted on today's noncontrast CT examination. Spleen: Unremarkable. Adrenals/Urinary Tract: 2.8 x 2.1 cm low-attenuation (5 HU) right adrenal nodule, similar to the prior examination, compatible with a adenoma. Left adrenal gland is normal in appearance. In the lateral aspect of the upper pole the right kidney there is a 2.2 cm low-attenuation lesion which is incompletely characterized on today's noncontrast CT examination, but was compatible with a simple cyst on the recent prior study. Multiple nonobstructive calculi are noted within the collecting systems of both kidneys measuring 2-3 mm in size. In addition, in the distal third of the right ureter shortly before the right ureterovesicular junction (axial image 91 of series 2 and coronal image 133 of series 6) there are 2 adjacent tiny  calculi measuring 3 mm. No significant proximal right hydroureteronephrosis. The ureter around the distal calculi does appear to be thickened, presumably inflamed. No left ureteral stones. No bladder stones. Unenhanced appearance  of the urinary bladder is unremarkable. Stomach/Bowel: Unenhanced appearance of the stomach is normal. No pathologic dilatation of small bowel or colon. Numerous colonic diverticulae are again noted, most evident in the sigmoid colon. In the right lower quadrant adjacent to both a loop of the sigmoid colon and adjacent to the appendix there is extensive inflammation with a trace amount of fluid (axial image 70 of series 3) without a well-defined abscess. These findings could reflect either an acute diverticulitis or an acute appendicitis. Notably, there is a small amount of pneumoperitoneum, indicative of perforation. Vascular/Lymphatic: Aortic atherosclerosis with fusiform ectasia of the infrarenal abdominal aorta which measures up to 2.8 cm in diameter. No lymphadenopathy noted in the abdomen or pelvis. Reproductive: Prostate gland and seminal vesicles are unremarkable in appearance. Other: No significant volume of ascites. Trace volume of pneumoperitoneum. Musculoskeletal: There are no aggressive appearing lytic or blastic lesions noted in the visualized portions of the skeleton. IMPRESSION: 1. Inflammatory process in the right lower quadrant of the abdomen adjacent to the appendix and to several diverticulae in the mid sigmoid colon. It is uncertain whether or not this represents an acute diverticulitis or an acute appendicitis. Regardless, there are extensive surrounding inflammatory changes, there is a trace volume of adjacent fluid which likely reflects a developing phlegmon, and there is clear evidence of perforation demonstrated by small volume of pneumoperitoneum. Emergent surgical consultation is strongly recommended. 2. Two tiny 3 mm calculi in the distal third of the right  ureter shortly before the right ureterovesicular junction. No proximal hydroureteronephrosis at this time. Additional tiny 2-3 mm nonobstructive calculi are noted within the collecting systems of both kidneys. 3. Aortic atherosclerosis. 4. Subtle changes in the lung bases bilaterally which could be indicative of early or mild interstitial lung disease. Follow-up nonemergent high-resolution chest CT is suggested in the next several months to better evaluate these findings. 5. Hepatic steatosis with evidence of underlying hepatic cirrhosis, as above. 6. Stable right adrenal adenoma. Aortic Atherosclerosis (ICD10-I70.0). Critical Value/emergent results were called by telephone at the time of interpretation on 05/28/2017 at 2:34 pm to [place] , who verbally acknowledged these results. Electronically Signed   By: Vinnie Langton M.D.   On: 05/28/2017 14:34    Review of Systems  Constitutional: Negative.   HENT: Positive for hearing loss (has hearing aides but won't wear them).   Eyes: Negative.   Respiratory: Negative.   Cardiovascular: Negative.   Gastrointestinal: Positive for abdominal pain, constipation and nausea. Negative for blood in stool, diarrhea, heartburn, melena and vomiting.  Genitourinary:       Trouble voiding  Musculoskeletal: Negative.   Skin: Negative.   Neurological: Negative.   Endo/Heme/Allergies: Negative.   Psychiatric/Behavioral: Negative.    Blood pressure 108/83, pulse 79, temperature 98 F (36.7 C), temperature source Oral, resp. rate 18, height '6\' 7"'$  (2.007 m), weight 131.3 kg (289 lb 9 oz), SpO2 100 %. Physical Exam  Constitutional: He is oriented to person, place, and time. He appears well-developed and well-nourished. No distress.  HENT:  Head: Normocephalic and atraumatic.  Mouth/Throat: Oropharynx is clear and moist. No oropharyngeal exudate.  Hard of hearing  Eyes: Right eye exhibits no discharge. Left eye exhibits no discharge. No scleral icterus.   Pupils are equal  Neck: Normal range of motion. Neck supple. No JVD present. No tracheal deviation present. No thyromegaly present.  Cardiovascular: Normal heart sounds.  No murmur heard. IN AF heart rate 150's.  Decreased distal pulses  Respiratory: Effort normal  and breath sounds normal. No respiratory distress. He has no wheezes. He has no rales. He exhibits no tenderness.  GI: Soft. Bowel sounds are normal. He exhibits no distension and no mass. There is tenderness (Tender RLQ, same as in 12/2016 with diverticultis). There is no rebound and no guarding.  Umbilical hernia repair. Pain in RLQ is the same pain he had back in 12/2016 with initial bout of diverticulitis.  Musculoskeletal: He exhibits no edema or tenderness.  Lymphadenopathy:    He has no cervical adenopathy.  Neurological: He is alert and oriented to person, place, and time. No cranial nerve deficit.  Skin: Skin is warm and dry. No rash noted. He is not diaphoretic. No erythema. No pallor.  Psychiatric: He has a normal mood and affect. His behavior is normal. Judgment and thought content normal.    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  Treated by Dr. Julien Nordmann Right adrenal nodule Bilateral renal calculi/urinary retention currently COPD/Heavy tobacco use, quit 12/2016 Atrial flutter, not on anticoagulants Hypertension BPH Hyperlipidemia Obesity  Plan:  Pt has been seen and examined by Dr. Lucia Gaskins. He is not acutely ill.    We recommend medical management.  IV Zosyn, IV hydration, bowel rest, and NPO except for some ice chips and sips for now.    We will follow with you.  Dr. Loralee Pacas is present and evaluating at this time also.  JENNINGS,WILLARD 05/28/2017, 3:55 PM   Agree with above. He does not look that sick.  Will hopefully respond medically. I left a  message with Dr. Julien Nordmann about his admission.  His significant other, Glenda Hanbee, is at the bedside.  He has a son, Aidyn, and a daughter, Anderson Malta.  Alphonsa Overall, MD, [facility] Pager: [phone] Office phone:  [phone]

## 2017-05-28 NOTE — ED Notes (Signed)
Pt was emergently given 12mg  Adenosine IV with Dr Shanon Brow at bedside

## 2017-05-28 NOTE — ED Notes (Signed)
Dr Shanon Brow gave verbal order to keep Cardizem infusion running at 7.5/mg/hr until 1813 .  Re evaluate with physician for titration orders .

## 2017-05-28 NOTE — Progress Notes (Signed)
Pt gave permission for his significant other to be in the room and help answer the questions on the nursing admission hx. Lucius Conn BSN, RN-BC Admissions RN 05/28/2017 5:03 PM

## 2017-05-28 NOTE — ED Triage Notes (Signed)
Pt reports from the cancer ct. Pt wants to be ruled out for possible perforation or appendicitis.  Pt has had a UA sent down from [facility].

## 2017-05-28 NOTE — Progress Notes (Signed)
These preliminary result these preliminary results were noted.  Awaiting final report.

## 2017-05-28 NOTE — ED Notes (Signed)
Per Pt: It is reported that pt has had difficulty peeing and moving his bowels. Pt report he has also had some increasing confusion this week, but pt is alert and oriented at this time with nurse. Pt's abdomen seems distended and taunt upon inspection. Pt reports he had chemo today.

## 2017-05-28 NOTE — Progress Notes (Signed)
Pharmacy Antibiotic Note  Claudell Wohler is a 67 y.o. male admitted on 05/28/2017 presented to the ED with c/o abdominal pain with outpatient abdominal CT showed perforated bowel in the right lower quadrant (unclear of source).  To start zosyn for intra-abdominal infection  - scr 0.80 (crcl ~100)  Plan: - zosyn 3.375 gm IV q8h (infuse over 4 hrs) - with good renal function, pharmacy will sign of for zosyn.  Re-consult [place] if need further assistance.  _________________________________  Height: 6\' 7"  (200.7 cm) Weight: 289 lb 9 oz (131.3 kg) IBW/kg (Calculated) : 93.7  Temp (24hrs), Avg:97.9 F (36.6 C), Min:97.8 F (36.6 C), Max:98 F (36.7 C)  Recent Labs  Lab 05/28/17 1106  WBC 8.2  CREATININE 0.80    Estimated Creatinine Clearance: 143.4 mL/min (by C-G formula based on SCr of 0.8 mg/dL).    Allergies  Allergen Reactions  . Ciprofloxacin     HIVES  . Adhesive [Tape] Other (See Comments)    Skin peels  . Codeine Other (See Comments)    Jitters, "skin crawling"    Thank you for allowing pharmacy to be a part of this patient's care.  Lynelle Doctor 05/28/2017 5:09 PM

## 2017-05-28 NOTE — ED Provider Notes (Signed)
[place] DEPT Provider Note   CSN: [id number] Arrival date & time: 05/28/17  1502     History   Chief Complaint Chief Complaint  Patient presents with  . Flank Pain    HPI Isac Lincks is a 67 y.o. male.  The history is provided by the patient and the spouse. No language interpreter was used.  Abdominal Pain   This is a new problem. The current episode started more than 2 days ago. The problem occurs constantly. The problem has been gradually worsening. The pain is associated with an unknown factor. The pain is located in the RLQ. The pain is moderate. Pertinent negatives include anorexia. Nothing aggravates the symptoms. Nothing relieves the symptoms. Past workup includes CT scan.  Pt in oncology patient followed in the oncology clinic he was sent today for CT scan of his abdomen due to right lower quadrant pain.  patient has a history of diverticulitis.  Neurologist reported inflammatory process in the right lower quadrant of the abdomen could be second to diverticulitis or appendicitis he reports there is clear evidence of perforation started by small volume of pneumoperitoneum radiologist recommended emergent surgical consult oncology sent patient to the emergency department for further evaluation he also had laboratory evaluation done  Past Medical History:  Diagnosis Date  . Arthritis   . BPH (benign prostatic hypertrophy) with urinary retention   . Chronic kidney disease    hx of kidney stones  2011  . COPD (chronic obstructive pulmonary disease) ([place])    has been smoking since he was 22 ish--  . High cholesterol   . Hypertension    dx around 2011    Patient Active Problem List   Diagnosis Date Noted  . Dysuria 04/03/2017  . 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 Encounter for antineoplastic chemotherapy 03/12/2017  . Encounter for antineoplastic immunotherapy 03/12/2017  . Stage IV squamous cell carcinoma of  right lung ([place]) 02/27/2017  . Benign prostatic hyperplasia with urinary retention   . Lung mass   . Atrial flutter ([place])   . Hypokalemia 01/27/2017  . Adrenal mass ([place]) 01/27/2017  . Tobacco abuse 01/27/2017  . COPD (chronic obstructive pulmonary disease) ([place]) 01/27/2017  . Hypertension 01/27/2017  . BPH (benign prostatic hypertrophy) with urinary retention 01/27/2017  . High cholesterol 01/27/2017  . Chronic kidney disease 01/27/2017  . Hyponatremia 01/27/2017  . Leukocytosis 01/27/2017  . RLQ abdominal pain 01/27/2017  . Diverticulitis of intestine with perforation 01/26/2017  . Atrial fibrillation with RVR ([place]) 01/26/2017  . Mass of upper lobe of right lung 01/26/2017  . Primary osteoarthritis of left knee 12/01/2014    Past Surgical History:  Procedure Laterality Date  . HERNIA REPAIR     umbilical hernia  07/4313  . INCISION AND DRAINAGE ABSCESS     "down the middle" of his buttocks  2015  . TOTAL KNEE ARTHROPLASTY Left 10/29/2014   Procedure: TOTAL KNEE ARTHROPLASTY;  Surgeon: Dorna Leitz, MD;  Location: [facility];  Service: Orthopedics;  Laterality: Left;       Home Medications    Prior to Admission medications   Medication Sig Start Date End Date Taking? Authorizing Provider  atorvastatin (LIPITOR) 10 MG tablet Take 10 mg by mouth daily.    [provider]  diltiazem (CARDIZEM CD) 300 MG 24 hr capsule Take 1 capsule (300 mg total) by mouth daily. 04/24/17   Arnoldo Lenis, MD  diphenhydrAMINE (BENADRYL) 25 MG tablet Take 25  mg every 6 (six) hours as needed by mouth.    [provider]  famotidine (PEPCID) 10 MG tablet Take 10 mg by mouth as needed for heartburn or indigestion.    [provider]  gabapentin (NEURONTIN) 300 MG capsule Take 1 capsule (300 mg total) by mouth 3 (three) times daily. Patient taking differently: Take 300 mg by mouth once.  05/14/17   Hayden Pedro, PA-C  ibuprofen (ADVIL,MOTRIN) 200 MG tablet Take  600-800 mg by mouth every 6 (six) hours as needed for mild pain.     [provider]  prochlorperazine (COMPAZINE) 10 MG tablet Take 1 tablet (10 mg total) by mouth every 6 (six) hours as needed for nausea or vomiting. 02/27/17   Curt Bears, MD  tamsulosin (FLOMAX) 0.4 MG CAPS capsule Take 0.4 mg by mouth daily.    [provider]  traMADol (ULTRAM) 50 MG tablet Take 1-2 tablets (50-100 mg total) by mouth every 6 (six) hours as needed. 03/25/17   Hayden Pedro, PA-C    Family History Family History  Problem Relation Age of Onset  . Hypertension Mother   . CVA Father   . Lung cancer Sister   . Lung cancer Brother   . Hypertension Brother   . Hypertension Sister     Social History Social History   Tobacco Use  . Smoking status: Former Smoker    Packs/day: 1.00    Years: 40.00    Pack years: 40.00    Types: Cigarettes    Last attempt to quit: 01/27/2017    Years since quitting: 0.3  . Smokeless tobacco: Former Systems developer    Types: Snuff  Substance Use Topics  . Alcohol use: No  . Drug use: No     Allergies   Ciprofloxacin; Adhesive [tape]; and Codeine   Review of Systems Review of Systems  Gastrointestinal: Positive for abdominal pain. Negative for anorexia.  All other systems reviewed and are negative.    Physical Exam Updated Vital Signs Ht 6\' 7"  (2.007 m)   Wt 131.3 kg (289 lb 9 oz)   BMI 32.62 kg/m   Physical Exam  Constitutional: He appears well-developed and well-nourished.  HENT:  Head: Normocephalic and atraumatic.  Eyes: Conjunctivae are normal.  Neck: Neck supple.  Cardiovascular: Normal rate and regular rhythm.  No murmur heard. Pulmonary/Chest: Effort normal and breath sounds normal. No respiratory distress.  Abdominal: Soft. There is tenderness.  Musculoskeletal: He exhibits no edema.  Neurological: He is alert.  Skin: Skin is warm and dry.  Psychiatric: He has a normal mood and affect.  Nursing note and vitals  reviewed.    ED Treatments / Results  Labs (all labs ordered are listed, but only abnormal results are displayed) Labs Reviewed - No data to display  EKG  EKG Interpretation None       Radiology Ct Renal Stone Study  Result Date: 05/28/2017 CLINICAL DATA:  67 year old male with history of right-sided groin and back pain since 05/25/2017. Gross hematuria. EXAM: CT ABDOMEN AND PELVIS WITHOUT CONTRAST TECHNIQUE: Multidetector CT imaging of the abdomen and pelvis was performed following the standard protocol without IV contrast. COMPARISON:  CT the chest, abdomen and pelvis 05/20/2017. FINDINGS: Lower chest: Subtle areas of septal thickening throughout the visualize lung bases. Atherosclerotic calcifications in the left anterior descending and right coronary arteries. Hepatobiliary: Mild diffuse low attenuation throughout the hepatic parenchyma, compatible with a background of hepatic steatosis. Liver has a slightly shrunken appearance and nodular contour,  indicative of underlying cirrhosis. No definite cystic or solid hepatic lesions are confidently identified on today's noncontrast CT examination. Unenhanced appearance of the gallbladder is normal. Pancreas: No definite pancreatic mass or peripancreatic fluid or inflammatory changes are noted on today's noncontrast CT examination. Spleen: Unremarkable. Adrenals/Urinary Tract: 2.8 x 2.1 cm low-attenuation (5 HU) right adrenal nodule, similar to the prior examination, compatible with a adenoma. Left adrenal gland is normal in appearance. In the lateral aspect of the upper pole the right kidney there is a 2.2 cm low-attenuation lesion which is incompletely characterized on today's noncontrast CT examination, but was compatible with a simple cyst on the recent prior study. Multiple nonobstructive calculi are noted within the collecting systems of both kidneys measuring 2-3 mm in size. In addition, in the distal third of the right ureter shortly before  the right ureterovesicular junction (axial image 91 of series 2 and coronal image 133 of series 6) there are 2 adjacent tiny calculi measuring 3 mm. No significant proximal right hydroureteronephrosis. The ureter around the distal calculi does appear to be thickened, presumably inflamed. No left ureteral stones. No bladder stones. Unenhanced appearance of the urinary bladder is unremarkable. Stomach/Bowel: Unenhanced appearance of the stomach is normal. No pathologic dilatation of small bowel or colon. Numerous colonic diverticulae are again noted, most evident in the sigmoid colon. In the right lower quadrant adjacent to both a loop of the sigmoid colon and adjacent to the appendix there is extensive inflammation with a trace amount of fluid (axial image 70 of series 3) without a well-defined abscess. These findings could reflect either an acute diverticulitis or an acute appendicitis. Notably, there is a small amount of pneumoperitoneum, indicative of perforation. Vascular/Lymphatic: Aortic atherosclerosis with fusiform ectasia of the infrarenal abdominal aorta which measures up to 2.8 cm in diameter. No lymphadenopathy noted in the abdomen or pelvis. Reproductive: Prostate gland and seminal vesicles are unremarkable in appearance. Other: No significant volume of ascites. Trace volume of pneumoperitoneum. Musculoskeletal: There are no aggressive appearing lytic or blastic lesions noted in the visualized portions of the skeleton. IMPRESSION: 1. Inflammatory process in the right lower quadrant of the abdomen adjacent to the appendix and to several diverticulae in the mid sigmoid colon. It is uncertain whether or not this represents an acute diverticulitis or an acute appendicitis. Regardless, there are extensive surrounding inflammatory changes, there is a trace volume of adjacent fluid which likely reflects a developing phlegmon, and there is clear evidence of perforation demonstrated by small volume of  pneumoperitoneum. Emergent surgical consultation is strongly recommended. 2. Two tiny 3 mm calculi in the distal third of the right ureter shortly before the right ureterovesicular junction. No proximal hydroureteronephrosis at this time. Additional tiny 2-3 mm nonobstructive calculi are noted within the collecting systems of both kidneys. 3. Aortic atherosclerosis. 4. Subtle changes in the lung bases bilaterally which could be indicative of early or mild interstitial lung disease. Follow-up nonemergent high-resolution chest CT is suggested in the next several months to better evaluate these findings. 5. Hepatic steatosis with evidence of underlying hepatic cirrhosis, as above. 6. Stable right adrenal adenoma. Aortic Atherosclerosis (ICD10-I70.0). Critical Value/emergent results were called by telephone at the time of interpretation on 05/28/2017 at 2:34 pm to [place] , who verbally acknowledged these results. Electronically Signed   By: Vinnie Langton M.D.   On: 05/28/2017 14:34    Procedures Procedures (including critical care time)  Medications Ordered in ED Medications - No data to display  Initial Impression / Assessment and Plan / ED Course  I have reviewed the triage vital signs and the nursing notes.  Pertinent labs & imaging results that were available during my care of the patient were reviewed by me and considered in my medical decision making (see chart for details).     I spoke to [employer] with surgery  He advised Zosyn.  He will see here.  Surgery request hospitalist admit.   Call to Hospitalist for admission  Final Clinical Impressions(s) / ED Diagnoses   Final diagnoses:  Perforated diverticulum    ED Discharge Orders    None       Sidney Ace 05/28/17 1653    Dorie Rank, MD 06/01/17 [phone]

## 2017-05-28 NOTE — Telephone Encounter (Signed)
Pt coming for labs as scheduled , I told Glenda to fill out a walk in form when pt arrives and triage nurse will f/u. Vague abd complaints

## 2017-05-28 NOTE — ED Notes (Signed)
Surgical PA at bedside  

## 2017-05-28 NOTE — ED Notes (Signed)
ED TO INPATIENT HANDOFF REPORT  Name/Age/Gender Aaron Sellers 67 y.o. male  Code Status    Code Status Orders  (From admission, onward)        Start     Ordered   05/28/17 1647  Full code  Continuous     05/28/17 1648    Code Status History    Date Active Date Inactive Code Status Order ID Comments User Context   01/26/2017 21:27 01/30/2017 21:53 Full Code [id number]  Truett Mainland, DO Inpatient   10/29/2014 16:17 10/31/2014 18:06 Full Code [id number]  Allen Norris, PA-C Inpatient      Home/SNF/Other Home  Chief Complaint flank pain   Level of Care/Admitting Diagnosis ED Disposition    ED Disposition Condition [facility] [facility]: [facility] [[id number]]  Level of Care: Stepdown [14]  Admit to SDU based on following criteria: Cardiac Instability:  Patients experiencing chest pain, unconfirmed MI and stable, arrhythmias and CHF requiring medical management and potentially compromising patient's stability  Diagnosis: Perforated bowel [facility]) [[id number]]  Admitting Physician: Phillips Grout [[id number]]  Attending Physician: Derrill Kay A [[id number]]  Estimated length of stay: 3 - 4 days  Certification:: I certify this patient will need inpatient services for at least 2 midnights  PT Class (Do Not Modify): Inpatient [101]  PT Acc Code (Do Not Modify): Private [1]       Medical History Past Medical History:  Diagnosis Date  . Arthritis   . Atrial flutter ([place])   . BPH (benign prostatic hypertrophy) with urinary retention   . Chronic kidney disease    hx of kidney stones  2011  . COPD (chronic obstructive pulmonary disease) ([place])    has been smoking since he was 22 ish--  . High cholesterol   . Hypertension    dx around 2011    Allergies Allergies  Allergen Reactions  . Ciprofloxacin     HIVES  . Adhesive [Tape] Other (See Comments)    Skin peels  . Codeine Other (See Comments)    Jitters, "skin crawling"    IV  Location/Drains/Wounds Patient Lines/Drains/Airways Status   Active Line/Drains/Airways    Name:   Placement date:   Placement time:   Site:   Days:   Peripheral IV 01/26/17 Right Hand   01/26/17    1503    Hand   122   Peripheral IV 01/26/17 Left Forearm   01/26/17    1504    Forearm   122   Peripheral IV 03/08/17 Anterior;Left Antecubital   03/08/17    1624    Antecubital   81   Peripheral IV 05/20/17 Anterior;Left Antecubital   05/20/17    1144    Antecubital   8   Peripheral IV 05/28/17 Left Antecubital   05/28/17    1523    Antecubital   less than 1   Incision (Closed) 10/29/14 Leg Left   10/29/14    1237     942          Labs/Imaging Results for orders placed or performed in visit on 05/28/17 (from the past 48 hour(s))  Comprehensive metabolic panel     Status: Abnormal   Collection Time: 05/28/17 11:06 AM  Result Value Ref Range   Sodium 137 136 - 145 mmol/L   Potassium 4.1 3.5 - 5.1 mmol/L   Chloride 101 98 - 109 mmol/L   CO2 28 22 - 29 mmol/L   Glucose, Bld 135 70 -  140 mg/dL   BUN 14 7 - 26 mg/dL   Creatinine, Ser 0.80 0.70 - 1.30 mg/dL   Calcium 9.4 8.4 - 10.4 mg/dL   Total Protein 6.7 6.4 - 8.3 g/dL   Albumin 3.2 (L) 3.5 - 5.0 g/dL   AST 24 5 - 34 U/L   ALT 35 0 - 55 U/L   Alkaline Phosphatase 122 40 - 150 U/L   Total Bilirubin 1.0 0.2 - 1.2 mg/dL   GFR calc non Af Amer >60 >60 mL/min   GFR calc Af Amer >60 >60 mL/min    Comment: (NOTE) The eGFR has been calculated using the CKD EPI equation. This calculation has not been validated in all clinical situations. eGFR's persistently <60 mL/min signify possible Chronic Kidney Disease.    Anion gap 8 3 - 11    Comment: Performed at [facility], [address], [place], [place] [address]  CBC with Differential     Status: Abnormal   Collection Time: 05/28/17 11:06 AM  Result Value Ref Range   WBC 8.2 4.0 - 10.3 K/uL   RBC 4.33 4.20 - 5.82 MIL/uL   Hemoglobin 12.8 (L) 13.0 - 17.1 g/dL    HCT 38.1 (L) 38.4 - 49.9 %   MCV 88.0 79.3 - 98.0 fL   MCH 29.5 27.2 - 33.4 pg   MCHC 33.5 32.0 - 36.0 g/dL   RDW 17.7 (H) 11.0 - 14.6 %   Platelets 132 (L) 140 - 400 K/uL   Neutrophils Relative % 73 %   Neutro Abs 6.0 1.5 - 6.5 K/uL   Lymphocytes Relative 12 %   Lymphs Abs 1.0 0.9 - 3.3 K/uL   Monocytes Relative 12 %   Monocytes Absolute 1.0 (H) 0.1 - 0.9 K/uL   Eosinophils Relative 2 %   Eosinophils Absolute 0.2 0.0 - 0.5 K/uL   Basophils Relative 1 %   Basophils Absolute 0.0 0.0 - 0.1 K/uL    Comment: Performed at [facility], [place] [address], [place], [place] [address]  Urinalysis, Complete w Microscopic     Status: Abnormal   Collection Time: 05/28/17 12:46 PM  Result Value Ref Range   Color, Urine AMBER (A) YELLOW    Comment: BIOCHEMICALS MAY BE AFFECTED BY COLOR   APPearance HAZY (A) CLEAR   Specific Gravity, Urine 1.025 1.005 - 1.030   pH 5.0 5.0 - 8.0   Glucose, UA NEGATIVE NEGATIVE mg/dL   Hgb urine dipstick LARGE (A) NEGATIVE   Bilirubin Urine NEGATIVE NEGATIVE   Ketones, ur NEGATIVE NEGATIVE mg/dL   Protein, ur NEGATIVE NEGATIVE mg/dL   Nitrite NEGATIVE NEGATIVE   Leukocytes, UA NEGATIVE NEGATIVE   RBC / HPF 6-30 0 - 5 RBC/hpf   WBC, UA 0-5 0 - 5 WBC/hpf   Bacteria, UA RARE (A) NONE SEEN   Squamous Epithelial / LPF 0-5 (A) NONE SEEN   Mucus PRESENT    Ca Oxalate Crys, UA PRESENT    Crystals PRESENT (A) NEGATIVE    Comment: Performed at [facility], [place] [address] [place], [place] [address]   Ct Renal Stone Study  Result Date: 05/28/2017 CLINICAL DATA:  67 year old male with history of right-sided groin and back pain since 05/25/2017. Gross hematuria. EXAM: CT ABDOMEN AND PELVIS WITHOUT CONTRAST TECHNIQUE: Multidetector CT imaging of the abdomen and pelvis was performed following the standard protocol without IV contrast. COMPARISON:  CT the chest, abdomen and pelvis 05/20/2017. FINDINGS: Lower chest: Subtle areas  of  septal thickening throughout the visualize lung bases. Atherosclerotic calcifications in the left anterior descending and right coronary arteries. Hepatobiliary: Mild diffuse low attenuation throughout the hepatic parenchyma, compatible with a background of hepatic steatosis. Liver has a slightly shrunken appearance and nodular contour, indicative of underlying cirrhosis. No definite cystic or solid hepatic lesions are confidently identified on today's noncontrast CT examination. Unenhanced appearance of the gallbladder is normal. Pancreas: No definite pancreatic mass or peripancreatic fluid or inflammatory changes are noted on today's noncontrast CT examination. Spleen: Unremarkable. Adrenals/Urinary Tract: 2.8 x 2.1 cm low-attenuation (5 HU) right adrenal nodule, similar to the prior examination, compatible with a adenoma. Left adrenal gland is normal in appearance. In the lateral aspect of the upper pole the right kidney there is a 2.2 cm low-attenuation lesion which is incompletely characterized on today's noncontrast CT examination, but was compatible with a simple cyst on the recent prior study. Multiple nonobstructive calculi are noted within the collecting systems of both kidneys measuring 2-3 mm in size. In addition, in the distal third of the right ureter shortly before the right ureterovesicular junction (axial image 91 of series 2 and coronal image 133 of series 6) there are 2 adjacent tiny calculi measuring 3 mm. No significant proximal right hydroureteronephrosis. The ureter around the distal calculi does appear to be thickened, presumably inflamed. No left ureteral stones. No bladder stones. Unenhanced appearance of the urinary bladder is unremarkable. Stomach/Bowel: Unenhanced appearance of the stomach is normal. No pathologic dilatation of small bowel or colon. Numerous colonic diverticulae are again noted, most evident in the sigmoid colon. In the right lower quadrant adjacent to both a loop of the  sigmoid colon and adjacent to the appendix there is extensive inflammation with a trace amount of fluid (axial image 70 of series 3) without a well-defined abscess. These findings could reflect either an acute diverticulitis or an acute appendicitis. Notably, there is a small amount of pneumoperitoneum, indicative of perforation. Vascular/Lymphatic: Aortic atherosclerosis with fusiform ectasia of the infrarenal abdominal aorta which measures up to 2.8 cm in diameter. No lymphadenopathy noted in the abdomen or pelvis. Reproductive: Prostate gland and seminal vesicles are unremarkable in appearance. Other: No significant volume of ascites. Trace volume of pneumoperitoneum. Musculoskeletal: There are no aggressive appearing lytic or blastic lesions noted in the visualized portions of the skeleton. IMPRESSION: 1. Inflammatory process in the right lower quadrant of the abdomen adjacent to the appendix and to several diverticulae in the mid sigmoid colon. It is uncertain whether or not this represents an acute diverticulitis or an acute appendicitis. Regardless, there are extensive surrounding inflammatory changes, there is a trace volume of adjacent fluid which likely reflects a developing phlegmon, and there is clear evidence of perforation demonstrated by small volume of pneumoperitoneum. Emergent surgical consultation is strongly recommended. 2. Two tiny 3 mm calculi in the distal third of the right ureter shortly before the right ureterovesicular junction. No proximal hydroureteronephrosis at this time. Additional tiny 2-3 mm nonobstructive calculi are noted within the collecting systems of both kidneys. 3. Aortic atherosclerosis. 4. Subtle changes in the lung bases bilaterally which could be indicative of early or mild interstitial lung disease. Follow-up nonemergent high-resolution chest CT is suggested in the next several months to better evaluate these findings. 5. Hepatic steatosis with evidence of underlying  hepatic cirrhosis, as above. 6. Stable right adrenal adenoma. Aortic Atherosclerosis (ICD10-I70.0). Critical Value/emergent results were called by telephone at the time of interpretation on 05/28/2017 at 2:34 pm to  [place] , who verbally acknowledged these results. Electronically Signed   By: Vinnie Langton M.D.   On: 05/28/2017 14:34    Pending Labs Unresulted Labs (From admission, onward)   Start     Ordered   05/29/17 1117  Basic metabolic panel  Tomorrow morning,   R     05/28/17 1648   05/29/17 0500  CBC  Tomorrow morning,   R     05/28/17 1648      Vitals/Pain Today's Vitals   05/28/17 1652 05/28/17 1707 05/28/17 1709 05/28/17 1713  BP:  128/89  125/74  Pulse: 79 85 (!) 136 77  Resp: (!) 23 (!) 25  (!) 26  Temp:      TempSrc:      SpO2: 100% 99%  100%  Weight:      Height:        Isolation Precautions No active isolations  Medications Medications  diltiazem (CARDIZEM) 100 mg in dextrose 5% 117m (1 mg/mL) infusion (5 mg/hr Intravenous New Bag/Given 05/28/17 1707)  0.9 %  sodium chloride infusion (not administered)  ondansetron (ZOFRAN) tablet 4 mg (not administered)    Or  ondansetron (ZOFRAN) injection 4 mg (not administered)  piperacillin-tazobactam (ZOSYN) IVPB 3.375 g (not administered)  piperacillin-tazobactam (ZOSYN) IVPB 3.375 g (0 g Intravenous Stopped 05/28/17 1649)  adenosine (ADENOCARD) 6 MG/2ML injection (6 mg  Given 05/28/17 1636)  adenosine (ADENOCARD) 6 MG/2ML injection (6 mg  Given 05/28/17 1634)  diltiazem (CARDIZEM) 1 mg/mL load via infusion 10 mg (10 mg Intravenous Bolus from Bag 05/28/17 1703)    Mobility walks

## 2017-05-28 NOTE — ED Notes (Signed)
Bed: WA03 Expected date:  Expected time:  Means of arrival:  Comments: [facility] center

## 2017-05-28 NOTE — ED Provider Notes (Signed)
Medical screening examination/treatment/procedure(s) were conducted as a shared visit with non-physician practitioner(s) and myself.  I personally evaluated the patient during the encounter.  Pt presented to the ED with abnormal outpatient CT scan.  ?acute appendicitis vs diverticulitis.  General surgery consulted.  IV abx started.  Pt admitted by the medical service.   Dorie Rank, MD 05/28/17 [phone]

## 2017-05-28 NOTE — H&P (Signed)
History and Physical    Aaron Sellers DJM:[id number] DOB: 11-19-1950 DOA: 05/28/2017  PCP: Sheryle Hail, PA-C  Patient coming from: Home  Chief Complaint: Abdominal pain  HPI: Aaron Sellers is a 67 y.o. male with medical history significant of lung cancer is currently on chemotherapy, COPD, chronic kidney disease, a flutter, hypertension comes in from oncology office with an abnormal CAT scan today.  Patient had a routine follow-up and his oncologist office today.  He noted and complained that he was having abdominal pain.  He is been having about 3 days of right lower quadrant abdominal pain that is progressively worsening.  He is not having any nausea vomiting or diarrhea.  He has been tolerating liquids but has not been eating as well.  His oncology center sent him to do a stat CT of his abdomen today that showed perforated bowel in the right lower quadrant unclear due to his appendix are diverticulitis.  Patient had an episode of acute diverticulitis last October where he had very similar symptoms.  Patient says he is not in a whole bunch of pain right now.  While in the ED however his heart rate shot up to the 160s.  Patient denied feeling any palpitations during this episode.  I tried carotid massage along with several episodes of doing Valsalva and heat his heart rate did not improve.  I have tried adenosine 6 mg followed by 6 mg followed by 12 mg which also did not break his rhythm.  It did slow his rhythm down and appears as a flutter with rapid response.  Patient is in no respiratory distress.  Patient is being referred for admission for perforated bowel.  General surgery has seen the patient who wished to try conservative measures with antibiotics first and hopefully avoid surgery at this time.    Review of Systems: As per HPI otherwise 10 point review of systems negative.   Past Medical History:  Diagnosis Date  . Arthritis   . Atrial flutter ([place])   . BPH (benign prostatic  hypertrophy) with urinary retention   . Chronic kidney disease    hx of kidney stones  2011  . COPD (chronic obstructive pulmonary disease) ([place])    has been smoking since he was 22 ish--  . High cholesterol   . Hypertension    dx around 2011    Past Surgical History:  Procedure Laterality Date  . HERNIA REPAIR     umbilical hernia  05/2295  . INCISION AND DRAINAGE ABSCESS     "down the middle" of his buttocks  2015  . TOTAL KNEE ARTHROPLASTY Left 10/29/2014   Procedure: TOTAL KNEE ARTHROPLASTY;  Surgeon: Dorna Leitz, MD;  Location: [facility];  Service: Orthopedics;  Laterality: Left;     reports that he quit smoking about 3 months ago. His smoking use included cigarettes. He has a 40.00 pack-year smoking history. He has quit using smokeless tobacco. His smokeless tobacco use included snuff. He reports that he does not drink alcohol or use drugs.  Allergies  Allergen Reactions  . Ciprofloxacin     HIVES  . Adhesive [Tape] Other (See Comments)    Skin peels  . Codeine Other (See Comments)    Jitters, "skin crawling"    Family History  Problem Relation Age of Onset  . Hypertension Mother   . CVA Father   . Lung cancer Sister   . Lung cancer Brother   . Hypertension Brother   . Hypertension Sister  Prior to Admission medications   Medication Sig Start Date End Date Taking? Authorizing Provider  acetaminophen (TYLENOL) 500 MG tablet Take 1,000 mg by mouth every 6 (six) hours as needed for moderate pain.   Yes [provider]  atorvastatin (LIPITOR) 10 MG tablet Take 10 mg by mouth every evening.    Yes [provider]  Cyanocobalamin (VITAMIN B-12 PO) Take 1 tablet by mouth daily.   Yes [provider]  diltiazem (CARDIZEM CD) 300 MG 24 hr capsule Take 1 capsule (300 mg total) by mouth daily. 04/24/17  Yes BranchAlphonse Guild, MD  famotidine (PEPCID) 10 MG tablet Take 10 mg by mouth as needed for heartburn or indigestion.   Yes [provider]  gabapentin (NEURONTIN) 300 MG capsule Take 1 capsule (300 mg total) by mouth 3 (three) times daily. Patient taking differently: Take 300 mg by mouth 2 (two) times daily as needed (pain).  05/14/17  Yes Hayden Pedro, PA-C  ibuprofen (ADVIL,MOTRIN) 200 MG tablet Take 400 mg by mouth every 6 (six) hours as needed for mild pain or moderate pain.    Yes [provider]  Loratadine-Pseudoephedrine (CLARITIN-D 12 HOUR PO) Take 1 tablet by mouth 2 (two) times daily.   Yes [provider]  tamsulosin (FLOMAX) 0.4 MG CAPS capsule Take 0.4 mg by mouth daily.   Yes [provider]  diphenhydrAMINE (BENADRYL) 25 MG tablet Take 25 mg by mouth every 6 (six) hours as needed for itching or allergies.     [provider]  prochlorperazine (COMPAZINE) 10 MG tablet Take 1 tablet (10 mg total) by mouth every 6 (six) hours as needed for nausea or vomiting. 02/27/17   Curt Bears, MD  traMADol (ULTRAM) 50 MG tablet Take 1-2 tablets (50-100 mg total) by mouth every 6 (six) hours as needed. Patient taking differently: Take 50-100 mg by mouth every 6 (six) hours as needed for moderate pain.  03/25/17   Hayden Pedro, PA-C    Physical Exam: Vitals:   05/28/17 1505 05/28/17 1519  BP:  108/83  Pulse:  79  Resp:  18  Temp:  98 F (36.7 C)  TempSrc:  Oral  SpO2:  100%  Weight: 131.3 kg (289 lb 9 oz)   Height: 6\' 7"  (2.007 m)       Constitutional: NAD, calm, comfortable Vitals:   05/28/17 1505 05/28/17 1519  BP:  108/83  Pulse:  79  Resp:  18  Temp:  98 F (36.7 C)  TempSrc:  Oral  SpO2:  100%  Weight: 131.3 kg (289 lb 9 oz)   Height: 6\' 7"  (2.007 m)    Eyes: PERRL, lids and conjunctivae normal ENMT: Mucous membranes are moist. Posterior pharynx clear of any exudate or lesions.Normal dentition.  Neck: normal, supple, no masses, no thyromegaly Respiratory: clear to auscultation bilaterally, no wheezing, no crackles. Normal respiratory effort.  No accessory muscle use.  Cardiovascular: Irregular  rate and irregular rhythm, no murmurs / rubs / gallops. No extremity edema. 2+ pedal pulses. No carotid bruits.  Abdomen: Right lower quadrant mild tenderness, no masses palpated. No hepatosplenomegaly. Bowel sounds positive.  No rebound no guarding soft and nonacute Musculoskeletal: no clubbing / cyanosis. No joint deformity upper and lower extremities. Good ROM, no contractures. Normal muscle tone.  Skin: no rashes, lesions, ulcers. No induration Neurologic: CN 2-12 grossly intact. Sensation intact, DTR normal. Strength 5/5 in all 4.  Psychiatric: Normal judgment and insight. Alert and oriented x 3.  Normal mood.    Labs on Admission: I have personally reviewed following labs and imaging studies  CBC: Recent Labs  Lab 05/28/17 1106  WBC 8.2  NEUTROABS 6.0  HGB 12.8*  HCT 38.1*  MCV 88.0  PLT 237*   Basic Metabolic Panel: Recent Labs  Lab 05/28/17 1106  NA 137  K 4.1  CL 101  CO2 28  GLUCOSE 135  BUN 14  CREATININE 0.80  CALCIUM 9.4   GFR: Estimated Creatinine Clearance: 143.4 mL/min (by C-G formula based on SCr of 0.8 mg/dL). Liver Function Tests: Recent Labs  Lab 05/28/17 1106  AST 24  ALT 35  ALKPHOS 122  BILITOT 1.0  PROT 6.7  ALBUMIN 3.2*   No results for input(s): LIPASE, AMYLASE in the last 168 hours. No results for input(s): AMMONIA in the last 168 hours. Coagulation Profile: No results for input(s): INR, PROTIME in the last 168 hours. Cardiac Enzymes: No results for input(s): CKTOTAL, CKMB, CKMBINDEX, TROPONINI in the last 168 hours. BNP (last 3 results) No results for input(s): PROBNP in the last 8760 hours. HbA1C: No results for input(s): HGBA1C in the last 72 hours. CBG: No results for input(s): GLUCAP in the last 168 hours. Lipid Profile: No results for input(s): CHOL, HDL, LDLCALC, TRIG, CHOLHDL, LDLDIRECT in the last 72 hours. Thyroid Function Tests: No results for input(s): TSH,  T4TOTAL, FREET4, T3FREE, THYROIDAB in the last 72 hours. Anemia Panel: No results for input(s): VITAMINB12, FOLATE, FERRITIN, TIBC, IRON, RETICCTPCT in the last 72 hours. Urine analysis:    Component Value Date/Time   COLORURINE AMBER (A) 05/28/2017 1246   APPEARANCEUR HAZY (A) 05/28/2017 1246   LABSPEC 1.025 05/28/2017 1246   LABSPEC 1.010 04/03/2017 1237   PHURINE 5.0 05/28/2017 1246   GLUCOSEU NEGATIVE 05/28/2017 1246   GLUCOSEU Negative 04/03/2017 1237   HGBUR LARGE (A) 05/28/2017 1246   BILIRUBINUR NEGATIVE 05/28/2017 1246   BILIRUBINUR Negative 04/03/2017 1237   KETONESUR NEGATIVE 05/28/2017 1246   PROTEINUR NEGATIVE 05/28/2017 1246   UROBILINOGEN 0.2 04/03/2017 1237   NITRITE NEGATIVE 05/28/2017 1246   LEUKOCYTESUR NEGATIVE 05/28/2017 1246   LEUKOCYTESUR Negative 04/03/2017 1237   Sepsis Labs: !!!!!!!!!!!!!!!!!!!!!!!!!!!!!!!!!!!!!!!!!!!! @LABRCNTIP (procalcitonin:4,lacticidven:4) )No results found for this or any previous visit (from the past 240 hour(s)).   Radiological Exams on Admission: Ct Renal Stone Study  Result Date: 05/28/2017 CLINICAL DATA:  67 year old male with history of right-sided groin and back pain since 05/25/2017. Gross hematuria. EXAM: CT ABDOMEN AND PELVIS WITHOUT CONTRAST TECHNIQUE: Multidetector CT imaging of the abdomen and pelvis was performed following the standard protocol without IV contrast. COMPARISON:  CT the chest, abdomen and pelvis 05/20/2017. FINDINGS: Lower chest: Subtle areas of septal thickening throughout the visualize lung bases. Atherosclerotic calcifications in the left anterior descending and right coronary arteries. Hepatobiliary: Mild diffuse low attenuation throughout the hepatic parenchyma, compatible with a background of hepatic steatosis. Liver has a slightly shrunken appearance and nodular contour, indicative of underlying cirrhosis. No definite cystic or solid hepatic lesions are confidently identified on today's noncontrast CT  examination. Unenhanced appearance of the gallbladder is normal. Pancreas: No definite pancreatic mass or peripancreatic fluid or inflammatory changes are noted on today's noncontrast CT examination. Spleen: Unremarkable. Adrenals/Urinary Tract: 2.8 x 2.1 cm low-attenuation (5 HU) right adrenal nodule, similar to the prior examination, compatible with a adenoma. Left adrenal gland is normal in appearance. In the lateral aspect of the upper pole the right kidney there is a 2.2 cm low-attenuation lesion which is incompletely characterized on  today's noncontrast CT examination, but was compatible with a simple cyst on the recent prior study. Multiple nonobstructive calculi are noted within the collecting systems of both kidneys measuring 2-3 mm in size. In addition, in the distal third of the right ureter shortly before the right ureterovesicular junction (axial image 91 of series 2 and coronal image 133 of series 6) there are 2 adjacent tiny calculi measuring 3 mm. No significant proximal right hydroureteronephrosis. The ureter around the distal calculi does appear to be thickened, presumably inflamed. No left ureteral stones. No bladder stones. Unenhanced appearance of the urinary bladder is unremarkable. Stomach/Bowel: Unenhanced appearance of the stomach is normal. No pathologic dilatation of small bowel or colon. Numerous colonic diverticulae are again noted, most evident in the sigmoid colon. In the right lower quadrant adjacent to both a loop of the sigmoid colon and adjacent to the appendix there is extensive inflammation with a trace amount of fluid (axial image 70 of series 3) without a well-defined abscess. These findings could reflect either an acute diverticulitis or an acute appendicitis. Notably, there is a small amount of pneumoperitoneum, indicative of perforation. Vascular/Lymphatic: Aortic atherosclerosis with fusiform ectasia of the infrarenal abdominal aorta which measures up to 2.8 cm in diameter.  No lymphadenopathy noted in the abdomen or pelvis. Reproductive: Prostate gland and seminal vesicles are unremarkable in appearance. Other: No significant volume of ascites. Trace volume of pneumoperitoneum. Musculoskeletal: There are no aggressive appearing lytic or blastic lesions noted in the visualized portions of the skeleton. IMPRESSION: 1. Inflammatory process in the right lower quadrant of the abdomen adjacent to the appendix and to several diverticulae in the mid sigmoid colon. It is uncertain whether or not this represents an acute diverticulitis or an acute appendicitis. Regardless, there are extensive surrounding inflammatory changes, there is a trace volume of adjacent fluid which likely reflects a developing phlegmon, and there is clear evidence of perforation demonstrated by small volume of pneumoperitoneum. Emergent surgical consultation is strongly recommended. 2. Two tiny 3 mm calculi in the distal third of the right ureter shortly before the right ureterovesicular junction. No proximal hydroureteronephrosis at this time. Additional tiny 2-3 mm nonobstructive calculi are noted within the collecting systems of both kidneys. 3. Aortic atherosclerosis. 4. Subtle changes in the lung bases bilaterally which could be indicative of early or mild interstitial lung disease. Follow-up nonemergent high-resolution chest CT is suggested in the next several months to better evaluate these findings. 5. Hepatic steatosis with evidence of underlying hepatic cirrhosis, as above. 6. Stable right adrenal adenoma. Aortic Atherosclerosis (ICD10-I70.0). Critical Value/emergent results were called by telephone at the time of interpretation on 05/28/2017 at 2:34 pm to [place] , who verbally acknowledged these results. Electronically Signed   By: Vinnie Langton M.D.   On: 05/28/2017 14:34    Old chart reviewed Case discussed with EDP PA Case discussed with general surgery team Mr. Creig Hines and Dr.  Lucia Gaskins   Assessment/Plan 67 year old male comes in with right lower quadrant pain either from acute diverticulitis versus appendicitis with microperforation also in a flutter with RVR  Principal Problem:   Perforated diverticulum-keep n.p.o. right now except for ice chips.  Will place on Zosyn IV pharmacy to dose.  Patient's abdomen exam is benign and patient not toxic appearing.  Hopefully he will respond to antibiotics alone and not need surgical intervention during this illness.  Active Problems:   Atrial fibrillation with RVR (HCC)-patient has failed carotid massage along with Valsalva and several doses of  adenosine.  Will do Cardizem load right now and place on drip.  Once his rate slows down it does appear to be in a flutter.  He is on Cardizem orally at home chronically.  Likely currently uncontrolled due to his current illness.  Denies significant pain.    COPD (chronic obstructive pulmonary disease) (HCC)-stable at this time.  Continue neb treatments and supplemental oxygen as needed    Hypertension-stable    Chronic kidney disease-stable with normal creatinine at this time    RLQ abdominal pain-as above    Stage IV squamous cell carcinoma of right lung (HCC)-continue outpatient follow-up with Dr. Julien Nordmann once he gets over this current medical infectious issue   DVT prophylaxis: SCDs for now Code Status: Full Family Communication: Wife Disposition Plan: Per day team Consults called: General surgery Admission status: Admission   Kalani Sthilaire A MD [facility]  If 7PM-7AM, please contact night-coverage [email or web address] Password [facility]  05/28/2017, 4:48 PM

## 2017-05-29 LAB — CBC
HCT: 35.1 % — ABNORMAL LOW (ref 39.0–52.0)
HEMOGLOBIN: 11.9 g/dL — AB (ref 13.0–17.0)
MCH: 30.5 pg (ref 26.0–34.0)
MCHC: 33.9 g/dL (ref 30.0–36.0)
MCV: 90 fL (ref 78.0–100.0)
PLATELETS: 141 10*3/uL — AB (ref 150–400)
RBC: 3.9 MIL/uL — ABNORMAL LOW (ref 4.22–5.81)
RDW: 16 % — ABNORMAL HIGH (ref 11.5–15.5)
WBC: 9.7 10*3/uL (ref 4.0–10.5)

## 2017-05-29 LAB — URINE CULTURE: CULTURE: NO GROWTH

## 2017-05-29 LAB — BASIC METABOLIC PANEL
Anion gap: 8 (ref 5–15)
BUN: 15 mg/dL (ref 6–20)
CHLORIDE: 104 mmol/L (ref 101–111)
CO2: 25 mmol/L (ref 22–32)
Calcium: 8.4 mg/dL — ABNORMAL LOW (ref 8.9–10.3)
Creatinine, Ser: 0.69 mg/dL (ref 0.61–1.24)
GFR calc Af Amer: >60 mL/min (ref >60)
GFR calc non Af Amer: >60 mL/min (ref >60)
GLUCOSE: 111 mg/dL — AB (ref 65–99)
POTASSIUM: 3.5 mmol/L (ref 3.5–5.1)
Sodium: 137 mmol/L (ref 135–145)

## 2017-05-29 LAB — MRSA PCR SCREENING: MRSA BY PCR: NEGATIVE

## 2017-05-29 MED ORDER — SODIUM CHLORIDE 0.9 % IV SOLN
INTRAVENOUS | Status: DC
  Administered 2017-05-29 – 2017-06-03 (×6): via INTRAVENOUS

## 2017-05-29 MED ORDER — LIP MEDEX EX OINT
TOPICAL_OINTMENT | CUTANEOUS | Status: DC | PRN
  Filled 2017-05-29: qty 7

## 2017-05-29 NOTE — Progress Notes (Addendum)
General Surgery follow up note  CC: Abdominal pain  Subjective: Patient looks great this a.m. he is back in a sinus rhythm.  He has some abdominal pain with palpation in the right lower quadrant.  No nausea or vomiting.  Objective: Vital signs in last 24 hours: Temp:  [97.8 F (36.6 C)-98.4 F (36.9 C)] 98.4 F (36.9 C) (02/27 0800) Pulse Rate:  [42-159] 82 (02/27 0800) Resp:  [14-30] 24 (02/27 0800) BP: (108-158)/(59-130) 130/63 (02/27 0800) SpO2:  [94 %-100 %] 95 % (02/27 0800) Weight:  [130 kg (286 lb 9.6 oz)-131.4 kg (289 lb 9.6 oz)] 130 kg (286 lb 9.6 oz) (02/27 0150)  N.p.o. 1104 IV 600 urine Afebrile, respiratory rate in the 20s, O2 sats are good on nasal cannula heart rate improved 80-90 range. BMP is normal potassium is 3.5.  Creatinine 0.69. WBC remains normal at 9.7. Hemoglobin 11.9, hematocrit 35.1, platelets 141,000. Intake/Output from previous day: 02/26 0701 - 02/27 0700 In: 1104.7 [I.V.:954.7; IV Piggyback:150] Out: 600 [Urine:600] Intake/Output this shift: Total I/O In: 15 [I.V.:15] Out: 300 [Urine:300]  General appearance: alert, cooperative and no distress GI: Soft, minimal tenderness in the right lower quadrant on palpation.  No nausea, vomiting, or diarrhea, bowel sounds are hypoactive but present.  Lab Results:  Recent Labs    05/28/17 1106 05/29/17 0316  WBC 8.2 9.7  HGB 12.8* 11.9*  HCT 38.1* 35.1*  PLT 132* 141*    BMET Recent Labs    05/28/17 1106 05/29/17 0316  NA 137 137  K 4.1 3.5  CL 101 104  CO2 28 25  GLUCOSE 135 111*  BUN 14 15  CREATININE 0.80 0.69  CALCIUM 9.4 8.4*   PT/INR No results for input(s): LABPROT, INR in the last 72 hours.  Recent Labs  Lab 05/28/17 1106  AST 24  ALT 35  ALKPHOS 122  BILITOT 1.0  PROT 6.7  ALBUMIN 3.2*     Lipase     Component Value Date/Time   LIPASE 18 01/26/2017 1512     Medications:   . diltiazem (CARDIZEM) infusion 7.5 mg/hr (05/29/17 0800)  .  piperacillin-tazobactam (ZOSYN)  IV 3.375 g (05/29/17 0618)   Anti-infectives (From admission, onward)   Start     Dose/Rate Route Frequency Ordered Stop   05/28/17 2200  piperacillin-tazobactam (ZOSYN) IVPB 3.375 g     3.375 g 12.5 mL/hr over 240 Minutes Intravenous Every 8 hours 05/28/17 1715     05/28/17 1615  piperacillin-tazobactam (ZOSYN) IVPB 3.375 g     3.375 g 100 mL/hr over 30 Minutes Intravenous  Once 05/28/17 1604 05/28/17 1649      Assessment/Plan Stage IV lung cancer with metastasis/chemotherapy last Rx 05/21/17   - Dr. Lorna Few Right adrenal nodule Lateral renal calculi/urinary retention COPD/heavy tobacco use, quit 12/2016 History of atrial flutter -not on anticoagulants Hypertension BPH Hyperlipidemia Obesity  Acute diverticulitis versus appendicitis with microperforation Acute diverticulitis 12/2016  FEN: IV fluids/n.p.o. except sips with meds. ID: Zosyn 2/26 =>> day 2 DVT: SCDs only Foley: None Follow-up: To be determined   Plan: Ongoing medical management.   LOS: 1 day    JENNINGS,WILLARD 05/29/2017 [phone]  Agree with above. Clearly better.  Has small BM. Continue to hope he avoids surgery. If doing this well in AM, will start diet.  His long term significant other, Lanier Prude, and son Sahas, are at the bedside.  Alphonsa Overall, MD, [facility] Pager: [phone] Office phone:  [phone]

## 2017-05-29 NOTE — Progress Notes (Signed)
[facility] Progress Note  Patient: Aaron Sellers YHC:[id number]   PCP: Sheryle Hail, PA-C DOB: 08-Aug-1950   DOA: 05/28/2017   DOS: 05/29/2017   Date of Service: the patient was seen and examined on 05/29/2017  Subjective: Feeling better.  No abdominal pain.  Just feels fatigued.  No shortness of breath no chest pain.  Brief hospital course: Pt. with PMH of stage IV metastatic lung cancer, COPD, chronic kidney disease, a flutter, hypertension; admitted on 05/28/2017, presented with complaint of abdominal pain, was found to have bowel perforation with acute diverticulitis as well as SVT. Currently further plan is continue current treatment.  Assessment and Plan:   Perforated diverticulum-keep n.p.o. right now except for ice chips.  Will place on Zosyn IV pharmacy to dose.  Patient's abdomen exam is benign and patient not toxic appearing.  Hopefully he will respond to antibiotics alone and not need surgical intervention during this illness.  Active Problems:   Atrial fibrillation with RVR (HCC)-patient has failed carotid massage along with Valsalva and several doses of adenosine.  Will do Cardizem load right now and place on drip.  Once his rate slows down it does appear to be in a flutter.  He is on Cardizem orally at home chronically.   Will continue Cardizem or IV as scheduled rate until he can take orally.  Hopefully tomorrow.    COPD (chronic obstructive pulmonary disease) (HCC)-stable at this time.  Continue neb treatments and supplemental oxygen as needed    Hypertension-stable    Chronic kidney disease-stable with normal creatinine at this time    RLQ abdominal pain-as above    Stage IV squamous cell carcinoma of right lung (HCC)-continue outpatient follow-up with Dr. Julien Nordmann once he gets over this current medical infectious issue  Diet: NPO DVT Prophylaxis: subcutaneous Heparin  Advance goals of care discussion: full code  Family Communication: family was present  at bedside, at the time of interview. The pt provided permission to discuss medical plan with the family. Opportunity was given to ask question and all questions were answered satisfactorily.   Disposition:  Discharge to home.  Consultants: General surgery oncology  Procedures: none  Antibiotics: Anti-infectives (From admission, onward)   Start     Dose/Rate Route Frequency Ordered Stop   05/28/17 2200  piperacillin-tazobactam (ZOSYN) IVPB 3.375 g     3.375 g 12.5 mL/hr over 240 Minutes Intravenous Every 8 hours 05/28/17 1715     05/28/17 1615  piperacillin-tazobactam (ZOSYN) IVPB 3.375 g     3.375 g 100 mL/hr over 30 Minutes Intravenous  Once 05/28/17 1604 05/28/17 1649       Objective: Physical Exam: Vitals:   05/29/17 1400 05/29/17 1500 05/29/17 1600 05/29/17 1800  BP: 125/78 122/69 135/73 137/76  Pulse: 80 76 75 76  Resp: (!) 24 (!) 26 (!) 27 (!) 26  Temp:   98 F (36.7 C)   TempSrc:   Oral   SpO2: 93% 96% 97% 96%  Weight:      Height:        Intake/Output Summary (Last 24 hours) at 05/29/2017 1812 Last data filed at 05/29/2017 1800 Gross per 24 hour  Intake 2304.15 ml  Output 1202 ml  Net 1102.15 ml   Filed Weights   05/28/17 1505 05/29/17 0150  Weight: 131.3 kg (289 lb 9 oz) 130 kg (286 lb 9.6 oz)   General: Alert, Awake and Oriented to Time, Place and Person. Appear in mild distress, affect appropriate Eyes: PERRL, Conjunctiva normal ENT:  Oral Mucosa clear moist. Neck: no JVD, no Abnormal Mass Or lumps Cardiovascular: S1 and S2 Present, no Murmur, Peripheral Pulses Present Respiratory: normal respiratory effort, Bilateral Air entry equal and Decreased, no use of accessory muscle, Clear to Auscultation, no Crackles, no wheezes Abdomen: Bowel Sound present, Soft and no tenderness, no hernia Skin: no redness, no Rash, no induration Extremities: no Pedal edema, no calf tenderness Neurologic: Grossly no focal neuro deficit. Bilaterally Equal motor  strength  Data Reviewed: CBC: Recent Labs  Lab 05/28/17 1106 05/29/17 0316  WBC 8.2 9.7  NEUTROABS 6.0  --   HGB 12.8* 11.9*  HCT 38.1* 35.1*  MCV 88.0 90.0  PLT 132* 509*   Basic Metabolic Panel: Recent Labs  Lab 05/28/17 1106 05/29/17 0316  NA 137 137  K 4.1 3.5  CL 101 104  CO2 28 25  GLUCOSE 135 111*  BUN 14 15  CREATININE 0.80 0.69  CALCIUM 9.4 8.4*    Liver Function Tests: Recent Labs  Lab 05/28/17 1106  AST 24  ALT 35  ALKPHOS 122  BILITOT 1.0  PROT 6.7  ALBUMIN 3.2*   No results for input(s): LIPASE, AMYLASE in the last 168 hours. No results for input(s): AMMONIA in the last 168 hours. Coagulation Profile: No results for input(s): INR, PROTIME in the last 168 hours. Cardiac Enzymes: No results for input(s): CKTOTAL, CKMB, CKMBINDEX, TROPONINI in the last 168 hours. BNP (last 3 results) No results for input(s): PROBNP in the last 8760 hours. CBG: No results for input(s): GLUCAP in the last 168 hours. Studies: No results found.  Scheduled Meds: Continuous Infusions: . sodium chloride 75 mL/hr at 05/29/17 1800  . diltiazem (CARDIZEM) infusion 7.5 mg/hr (05/29/17 1800)  . piperacillin-tazobactam (ZOSYN)  IV Stopped (05/29/17 1731)   PRN Meds: lip balm, ondansetron **OR** ondansetron (ZOFRAN) IV  Time spent: 35 minutes  Author: Berle Mull, MD [facility] Pager: [phone] 05/29/2017 6:12 PM  If 7PM-7AM, please contact night-coverage at [email or web address], password [facility]

## 2017-05-29 NOTE — ED Notes (Signed)
ED TO INPATIENT HANDOFF REPORT  Name/Age/Gender Aaron Sellers 67 y.o. male  Code Status    Code Status Orders  (From admission, onward)        Start     Ordered   05/28/17 1647  Full code  Continuous     05/28/17 1648    Code Status History    Date Active Date Inactive Code Status Order ID Comments User Context   01/26/2017 21:27 01/30/2017 21:53 Full Code [id number]  Truett Mainland, DO Inpatient   10/29/2014 16:17 10/31/2014 18:06 Full Code [id number]  Allen Norris, PA-C Inpatient      Home/SNF/Other Home  Chief Complaint flank pain   Level of Care/Admitting Diagnosis ED Disposition    ED Disposition Condition [facility] Area: [facility] [[id number]]  Level of Care: Stepdown [14]  Admit to SDU based on following criteria: Cardiac Instability:  Patients experiencing chest pain, unconfirmed MI and stable, arrhythmias and CHF requiring medical management and potentially compromising patient's stability  Diagnosis: Perforated bowel [facility]) [[id number]]  Admitting Physician: Phillips Grout [[id number]]  Attending Physician: Derrill Kay A [[id number]]  Estimated length of stay: 3 - 4 days  Certification:: I certify this patient will need inpatient services for at least 2 midnights  PT Class (Do Not Modify): Inpatient [101]  PT Acc Code (Do Not Modify): Private [1]       Medical History Past Medical History:  Diagnosis Date  . Arthritis   . Atrial flutter ([place])   . BPH (benign prostatic hypertrophy) with urinary retention   . Cancer ([place])   . Chronic kidney disease    hx of kidney stones  2011  . COPD (chronic obstructive pulmonary disease) ([place])    has been smoking since he was 22 ish--  . High cholesterol   . Hypertension    dx around 2011  . Squamous cell carcinoma of right lung (HCC)     Allergies Allergies  Allergen Reactions  . Ciprofloxacin     HIVES  . Adhesive [Tape] Other (See Comments)    Skin peels  . Codeine Other (See  Comments)    Jitters, "skin crawling"    IV Location/Drains/Wounds Patient Lines/Drains/Airways Status   Active Line/Drains/Airways    Name:   Placement date:   Placement time:   Site:   Days:   Peripheral IV 05/28/17 Left Antecubital   05/28/17    1523    Antecubital   1   Peripheral IV 05/28/17 Left Hand   05/28/17    2321    Hand   1          Labs/Imaging Results for orders placed or performed in visit on 05/28/17 (from the past 48 hour(s))  Comprehensive metabolic panel     Status: Abnormal   Collection Time: 05/28/17 11:06 AM  Result Value Ref Range   Sodium 137 136 - 145 mmol/L   Potassium 4.1 3.5 - 5.1 mmol/L   Chloride 101 98 - 109 mmol/L   CO2 28 22 - 29 mmol/L   Glucose, Bld 135 70 - 140 mg/dL   BUN 14 7 - 26 mg/dL   Creatinine, Ser 0.80 0.70 - 1.30 mg/dL   Calcium 9.4 8.4 - 10.4 mg/dL   Total Protein 6.7 6.4 - 8.3 g/dL   Albumin 3.2 (L) 3.5 - 5.0 g/dL   AST 24 5 - 34 U/L   ALT 35 0 - 55 U/L   Alkaline Phosphatase 122 40 -  150 U/L   Total Bilirubin 1.0 0.2 - 1.2 mg/dL   GFR calc non Af Amer >60 >60 mL/min   GFR calc Af Amer >60 >60 mL/min    Comment: (NOTE) The eGFR has been calculated using the CKD EPI equation. This calculation has not been validated in all clinical situations. eGFR's persistently <60 mL/min signify possible Chronic Kidney Disease.    Anion gap 8 3 - 11    Comment: Performed at [facility], [address], [place], [place] [address]  CBC with Differential     Status: Abnormal   Collection Time: 05/28/17 11:06 AM  Result Value Ref Range   WBC 8.2 4.0 - 10.3 K/uL   RBC 4.33 4.20 - 5.82 MIL/uL   Hemoglobin 12.8 (L) 13.0 - 17.1 g/dL   HCT 38.1 (L) 38.4 - 49.9 %   MCV 88.0 79.3 - 98.0 fL   MCH 29.5 27.2 - 33.4 pg   MCHC 33.5 32.0 - 36.0 g/dL   RDW 17.7 (H) 11.0 - 14.6 %   Platelets 132 (L) 140 - 400 K/uL   Neutrophils Relative % 73 %   Neutro Abs 6.0 1.5 - 6.5 K/uL   Lymphocytes Relative 12 %   Lymphs Abs 1.0  0.9 - 3.3 K/uL   Monocytes Relative 12 %   Monocytes Absolute 1.0 (H) 0.1 - 0.9 K/uL   Eosinophils Relative 2 %   Eosinophils Absolute 0.2 0.0 - 0.5 K/uL   Basophils Relative 1 %   Basophils Absolute 0.0 0.0 - 0.1 K/uL    Comment: Performed at [facility], [place] [address], [place], [place] [address]  Urinalysis, Complete w Microscopic     Status: Abnormal   Collection Time: 05/28/17 12:46 PM  Result Value Ref Range   Color, Urine AMBER (A) YELLOW    Comment: BIOCHEMICALS MAY BE AFFECTED BY COLOR   APPearance HAZY (A) CLEAR   Specific Gravity, Urine 1.025 1.005 - 1.030   pH 5.0 5.0 - 8.0   Glucose, UA NEGATIVE NEGATIVE mg/dL   Hgb urine dipstick LARGE (A) NEGATIVE   Bilirubin Urine NEGATIVE NEGATIVE   Ketones, ur NEGATIVE NEGATIVE mg/dL   Protein, ur NEGATIVE NEGATIVE mg/dL   Nitrite NEGATIVE NEGATIVE   Leukocytes, UA NEGATIVE NEGATIVE   RBC / HPF 6-30 0 - 5 RBC/hpf   WBC, UA 0-5 0 - 5 WBC/hpf   Bacteria, UA RARE (A) NONE SEEN   Squamous Epithelial / LPF 0-5 (A) NONE SEEN   Mucus PRESENT    Ca Oxalate Crys, UA PRESENT    Crystals PRESENT (A) NEGATIVE    Comment: Performed at [facility], [place] [address], [place], [place] [address]   Ct Renal Stone Study  Result Date: 05/28/2017 CLINICAL DATA:  67 year old male with history of right-sided groin and back pain since 05/25/2017. Gross hematuria. EXAM: CT ABDOMEN AND PELVIS WITHOUT CONTRAST TECHNIQUE: Multidetector CT imaging of the abdomen and pelvis was performed following the standard protocol without IV contrast. COMPARISON:  CT the chest, abdomen and pelvis 05/20/2017. FINDINGS: Lower chest: Subtle areas of septal thickening throughout the visualize lung bases. Atherosclerotic calcifications in the left anterior descending and right coronary arteries. Hepatobiliary: Mild diffuse low attenuation throughout the hepatic parenchyma, compatible with a background of hepatic steatosis. Liver has a  slightly shrunken appearance and nodular contour, indicative of underlying cirrhosis. No definite cystic or solid hepatic lesions are confidently identified on today's noncontrast CT examination. Unenhanced appearance of the  gallbladder is normal. Pancreas: No definite pancreatic mass or peripancreatic fluid or inflammatory changes are noted on today's noncontrast CT examination. Spleen: Unremarkable. Adrenals/Urinary Tract: 2.8 x 2.1 cm low-attenuation (5 HU) right adrenal nodule, similar to the prior examination, compatible with a adenoma. Left adrenal gland is normal in appearance. In the lateral aspect of the upper pole the right kidney there is a 2.2 cm low-attenuation lesion which is incompletely characterized on today's noncontrast CT examination, but was compatible with a simple cyst on the recent prior study. Multiple nonobstructive calculi are noted within the collecting systems of both kidneys measuring 2-3 mm in size. In addition, in the distal third of the right ureter shortly before the right ureterovesicular junction (axial image 91 of series 2 and coronal image 133 of series 6) there are 2 adjacent tiny calculi measuring 3 mm. No significant proximal right hydroureteronephrosis. The ureter around the distal calculi does appear to be thickened, presumably inflamed. No left ureteral stones. No bladder stones. Unenhanced appearance of the urinary bladder is unremarkable. Stomach/Bowel: Unenhanced appearance of the stomach is normal. No pathologic dilatation of small bowel or colon. Numerous colonic diverticulae are again noted, most evident in the sigmoid colon. In the right lower quadrant adjacent to both a loop of the sigmoid colon and adjacent to the appendix there is extensive inflammation with a trace amount of fluid (axial image 70 of series 3) without a well-defined abscess. These findings could reflect either an acute diverticulitis or an acute appendicitis. Notably, there is a small amount of  pneumoperitoneum, indicative of perforation. Vascular/Lymphatic: Aortic atherosclerosis with fusiform ectasia of the infrarenal abdominal aorta which measures up to 2.8 cm in diameter. No lymphadenopathy noted in the abdomen or pelvis. Reproductive: Prostate gland and seminal vesicles are unremarkable in appearance. Other: No significant volume of ascites. Trace volume of pneumoperitoneum. Musculoskeletal: There are no aggressive appearing lytic or blastic lesions noted in the visualized portions of the skeleton. IMPRESSION: 1. Inflammatory process in the right lower quadrant of the abdomen adjacent to the appendix and to several diverticulae in the mid sigmoid colon. It is uncertain whether or not this represents an acute diverticulitis or an acute appendicitis. Regardless, there are extensive surrounding inflammatory changes, there is a trace volume of adjacent fluid which likely reflects a developing phlegmon, and there is clear evidence of perforation demonstrated by small volume of pneumoperitoneum. Emergent surgical consultation is strongly recommended. 2. Two tiny 3 mm calculi in the distal third of the right ureter shortly before the right ureterovesicular junction. No proximal hydroureteronephrosis at this time. Additional tiny 2-3 mm nonobstructive calculi are noted within the collecting systems of both kidneys. 3. Aortic atherosclerosis. 4. Subtle changes in the lung bases bilaterally which could be indicative of early or mild interstitial lung disease. Follow-up nonemergent high-resolution chest CT is suggested in the next several months to better evaluate these findings. 5. Hepatic steatosis with evidence of underlying hepatic cirrhosis, as above. 6. Stable right adrenal adenoma. Aortic Atherosclerosis (ICD10-I70.0). Critical Value/emergent results were called by telephone at the time of interpretation on 05/28/2017 at 2:34 pm to [place] , who verbally acknowledged these results. Electronically  Signed   By: Vinnie Langton M.D.   On: 05/28/2017 14:34    Pending Labs Unresulted Labs (From admission, onward)   Start     Ordered   05/29/17 5643  Basic metabolic panel  Tomorrow morning,   R     05/28/17 1648   05/29/17 0500  CBC  Tomorrow morning,   R     05/28/17 1648      Vitals/Pain Today's Vitals   05/28/17 2330 05/29/17 0000 05/29/17 0100 05/29/17 0111  BP: 115/78 120/81 121/79 121/79  Pulse: 93 90 (!) 109 93  Resp:  (!) 21 (!) 23 (!) 30  Temp:      TempSrc:      SpO2: 99% 98% 98% 97%  Weight:      Height:      PainSc:        Isolation Precautions No active isolations  Medications Medications  diltiazem (CARDIZEM) 100 mg in dextrose 5% 120m (1 mg/mL) infusion (7.5 mg/hr Intravenous Rate/Dose Change 05/28/17 1713)  0.9 %  sodium chloride infusion ( Intravenous New Bag/Given 05/28/17 2327)  ondansetron (ZOFRAN) tablet 4 mg (not administered)    Or  ondansetron (ZOFRAN) injection 4 mg (not administered)  piperacillin-tazobactam (ZOSYN) IVPB 3.375 g (0 g Intravenous Stopped 05/29/17 0005)  piperacillin-tazobactam (ZOSYN) IVPB 3.375 g (0 g Intravenous Stopped 05/28/17 1649)  adenosine (ADENOCARD) 6 MG/2ML injection (6 mg  Given 05/28/17 1636)  adenosine (ADENOCARD) 6 MG/2ML injection (6 mg  Given 05/28/17 1634)  diltiazem (CARDIZEM) 1 mg/mL load via infusion 10 mg (10 mg Intravenous Bolus from Bag 05/28/17 1703)    Mobility walks

## 2017-05-29 NOTE — Progress Notes (Signed)
These preliminary result these preliminary results were noted.  Awaiting final report.

## 2017-05-30 LAB — CBC WITH DIFFERENTIAL/PLATELET
BASOS PCT: 0 %
Basophils Absolute: 0.1 10*3/uL (ref 0.0–0.1)
EOS ABS: 0.1 10*3/uL (ref 0.0–0.7)
EOS PCT: 1 %
HCT: 36.1 % — ABNORMAL LOW (ref 39.0–52.0)
Hemoglobin: 11.7 g/dL — ABNORMAL LOW (ref 13.0–17.0)
Lymphocytes Relative: 12 %
Lymphs Abs: 1.8 10*3/uL (ref 0.7–4.0)
MCH: 29.5 pg (ref 26.0–34.0)
MCHC: 32.4 g/dL (ref 30.0–36.0)
MCV: 91.2 fL (ref 78.0–100.0)
MONO ABS: 1.2 10*3/uL (ref 0.1–1.0)
MONOS PCT: 8 %
Neutro Abs: 12 10*3/uL (ref 1.7–7.7)
Neutrophils Relative %: 79 %
PLATELETS: 179 10*3/uL (ref 150–400)
RBC: 3.96 MIL/uL — ABNORMAL LOW (ref 4.22–5.81)
RDW: 16.1 % — ABNORMAL HIGH (ref 11.5–15.5)
WBC: 15.1 10*3/uL — ABNORMAL HIGH (ref 4.0–10.5)

## 2017-05-30 LAB — COMPREHENSIVE METABOLIC PANEL
ALK PHOS: 94 U/L (ref 38–126)
ALT: 36 U/L (ref 17–63)
ANION GAP: 7 (ref 5–15)
AST: 22 U/L (ref 15–41)
Albumin: 3.2 g/dL — ABNORMAL LOW (ref 3.5–5.0)
BUN: 17 mg/dL (ref 6–20)
CALCIUM: 8.6 mg/dL — AB (ref 8.9–10.3)
CHLORIDE: 105 mmol/L (ref 101–111)
CO2: 27 mmol/L (ref 22–32)
Creatinine, Ser: 0.81 mg/dL (ref 0.61–1.24)
GFR calc non Af Amer: >60 mL/min (ref >60)
Glucose, Bld: 91 mg/dL (ref 65–99)
Potassium: 3.6 mmol/L (ref 3.5–5.1)
SODIUM: 139 mmol/L (ref 135–145)
Total Bilirubin: 1 mg/dL (ref 0.3–1.2)
Total Protein: 6 g/dL — ABNORMAL LOW (ref 6.5–8.1)

## 2017-05-30 LAB — MAGNESIUM: Magnesium: 1.9 mg/dL (ref 1.7–2.4)

## 2017-05-30 MED ORDER — ACETAMINOPHEN 325 MG PO TABS
650.0000 mg | ORAL_TABLET | Freq: Four times a day (QID) | ORAL | Status: DC | PRN
  Administered 2017-05-30: 650 mg via ORAL
  Filled 2017-05-30: qty 2

## 2017-05-30 MED ORDER — DILTIAZEM HCL ER COATED BEADS 180 MG PO CP24
300.0000 mg | ORAL_CAPSULE | Freq: Every day | ORAL | Status: DC
  Administered 2017-05-30 – 2017-05-31 (×2): 300 mg via ORAL
  Filled 2017-05-30 (×2): qty 1

## 2017-05-30 MED ORDER — HYDROCODONE-ACETAMINOPHEN 5-325 MG PO TABS: 1.0000 | ORAL_TABLET | Freq: Four times a day (QID) | ORAL | Status: DC | PRN

## 2017-05-30 NOTE — Progress Notes (Signed)
[facility] Progress Note  Patient: Aaron Sellers JIR:[id number]   PCP: Sheryle Hail, PA-C DOB: [birth year]   DOA: 05/28/2017   DOS: 05/30/2017   Date of Service: the patient was seen and examined on 05/30/2017  Subjective: Continues to feel better.  No nausea no vomiting.  No abdominal pain.  Passing gas.  Brief hospital course: Pt. with PMH of stage IV metastatic lung cancer, COPD, chronic kidney disease, a flutter, hypertension; admitted on 05/28/2017, presented with complaint of abdominal pain, was found to have bowel perforation with acute diverticulitis as well as SVT. Currently further plan is continue current treatment.  Assessment and Plan:   Perforated diverticulum- Zosyn IV pharmacy to dose.   Patient's abdomen exam is benign and patient not toxic appearing.   General surgery consulted. Appreciate input. Conservative management recommended. Diet advanced to clear liquid today. Transfer to telemetry.  Active Problems:   Atrial fibrillation with RVR (HCC)-patient has failed carotid massage along with Valsalva and several doses of adenosine.  Will do Cardizem load right now and place on drip.  Once his rate slows down it does appear to be in a flutter.  He is on Cardizem orally at home chronically.   Started on IV Cardizem and now that the patient is able to take orally will transition him to oral Cardizem home dose and transferred to telemetry.    COPD (chronic obstructive pulmonary disease) (HCC)-stable at this time.  Continue neb treatments and supplemental oxygen as needed    Hypertension-stable    Chronic kidney disease-stable with normal creatinine at this time    RLQ abdominal pain-as above    Stage IV squamous cell carcinoma of right lung (HCC)-continue outpatient follow-up with Dr. Julien Nordmann once he gets over this current medical infectious issue  Diet: Clear liquid diet DVT Prophylaxis: subcutaneous Heparin  Advance goals of care discussion: full  code  Family Communication: family was present at bedside, at the time of interview. The pt provided permission to discuss medical plan with the family. Opportunity was given to ask question and all questions were answered satisfactorily.   Disposition:  Discharge to home.  Consultants: General surgery oncology  Procedures: none  Antibiotics: Anti-infectives (From admission, onward)   Start     Dose/Rate Route Frequency Ordered Stop   05/28/17 2200  piperacillin-tazobactam (ZOSYN) IVPB 3.375 g     3.375 g 12.5 mL/hr over 240 Minutes Intravenous Every 8 hours 05/28/17 1715     05/28/17 1615  piperacillin-tazobactam (ZOSYN) IVPB 3.375 g     3.375 g 100 mL/hr over 30 Minutes Intravenous  Once 05/28/17 1604 05/28/17 1649       Objective: Physical Exam: Vitals:   05/30/17 1100 05/30/17 1200 05/30/17 1300 05/30/17 1400  BP: 134/76 122/88 (!) 110/58 122/72  Pulse: 69 69 68 66  Resp: (!) 21 19 (!) 24 (!) 22  Temp:  98.1 F (36.7 C)    TempSrc:  Axillary    SpO2: 97% 96% 99% 98%  Weight:      Height:        Intake/Output Summary (Last 24 hours) at 05/30/2017 1600 Last data filed at 05/30/2017 1300 Gross per 24 hour  Intake 1908.68 ml  Output 352 ml  Net 1556.68 ml   Filed Weights   05/28/17 1505 05/29/17 0150  Weight: 131.3 kg (289 lb 9 oz) 130 kg (286 lb 9.6 oz)   General: Alert, Awake and Oriented to Time, Place and Person. Appear in mild distress, affect appropriate Eyes: PERRL,  Conjunctiva normal ENT: Oral Mucosa clear moist. Neck: no JVD, no Abnormal Mass Or lumps Cardiovascular: S1 and S2 Present, no Murmur, Peripheral Pulses Present Respiratory: normal respiratory effort, Bilateral Air entry equal and Decreased, no use of accessory muscle, Clear to Auscultation, no Crackles, no wheezes Abdomen: Bowel Sound present, Soft and no tenderness, no hernia Skin: no redness, no Rash, no induration Extremities: no Pedal edema, no calf tenderness Neurologic: Grossly no focal  neuro deficit. Bilaterally Equal motor strength  Data Reviewed: CBC: Recent Labs  Lab 05/28/17 1106 05/29/17 0316 05/30/17 0255  WBC 8.2 9.7 15.1*  NEUTROABS 6.0  --  12.0  HGB 12.8* 11.9* 11.7*  HCT 38.1* 35.1* 36.1*  MCV 88.0 90.0 91.2  PLT 132* 141* 378   Basic Metabolic Panel: Recent Labs  Lab 05/28/17 1106 05/29/17 0316 05/30/17 0255  NA 137 137 139  K 4.1 3.5 3.6  CL 101 104 105  CO2 28 25 27   GLUCOSE 135 111* 91  BUN 14 15 17   CREATININE 0.80 0.69 0.81  CALCIUM 9.4 8.4* 8.6*  MG  --   --  1.9    Liver Function Tests: Recent Labs  Lab 05/28/17 1106 05/30/17 0255  AST 24 22  ALT 35 36  ALKPHOS 122 94  BILITOT 1.0 1.0  PROT 6.7 6.0*  ALBUMIN 3.2* 3.2*   No results for input(s): LIPASE, AMYLASE in the last 168 hours. No results for input(s): AMMONIA in the last 168 hours. Coagulation Profile: No results for input(s): INR, PROTIME in the last 168 hours. Cardiac Enzymes: No results for input(s): CKTOTAL, CKMB, CKMBINDEX, TROPONINI in the last 168 hours. BNP (last 3 results) No results for input(s): PROBNP in the last 8760 hours. CBG: No results for input(s): GLUCAP in the last 168 hours. Studies: No results found.  Scheduled Meds: . diltiazem  300 mg Oral Daily   Continuous Infusions: . sodium chloride 75 mL/hr at 05/30/17 1300  . piperacillin-tazobactam (ZOSYN)  IV 3.375 g (05/30/17 1300)   PRN Meds: lip balm, ondansetron **OR** ondansetron (ZOFRAN) IV  Time spent: 35 minutes  Author: Berle Mull, MD [facility] Pager: [phone] 05/30/2017 4:00 PM  If 7PM-7AM, please contact night-coverage at [email or web address], password [facility]

## 2017-05-30 NOTE — Progress Notes (Signed)
Pt arrived to unit with no apparent signs symptoms of distress. Removed O2. spo2 99% RA. Denies SOB. Pt ambulates to bathroom 1 person assist spo2 >96%. Pt currently on RA with O2 tubing at bedside. Will cont to mon O2 saturation

## 2017-05-30 NOTE — Progress Notes (Addendum)
    CC:  Abdominal pain  Subjective: No pain at all this AM, even RLQ nontender.    Objective: Vital signs in last 24 hours: Temp:  [97.5 F (36.4 C)-98.2 F (36.8 C)] 97.9 F (36.6 C) (02/28 0800) Pulse Rate:  [69-80] 70 (02/28 0730) Resp:  [17-27] 17 (02/28 0730) BP: (122-140)/(69-82) 132/78 (02/28 0700) SpO2:  [92 %-99 %] 99 % (02/28 0730) Last BM Date: 05/29/17 2275 IV 951 urine Stool x 3 Afebrile, VSS WBC is up this AM to 15.1.  This is the first WBC elevation No films  Intake/Output from previous day: 02/27 0701 - 02/28 0700 In: 2275 [I.V.:1815; IV Piggyback:100] Out: 954 [Urine:951; Stool:3] Intake/Output this shift: No intake/output data recorded.  General appearance: alert, cooperative and no distress Resp: clear to auscultation bilaterally Cardio: in sinus rhythm this Am GI: soft, non-tender; bowel sounds normal; no masses,  no organomegaly  Lab Results:  Recent Labs    05/29/17 0316 05/30/17 0255  WBC 9.7 15.1*  HGB 11.9* 11.7*  HCT 35.1* 36.1*  PLT 141* 179    BMET Recent Labs    05/29/17 0316 05/30/17 0255  NA 137 139  K 3.5 3.6  CL 104 105  CO2 25 27  GLUCOSE 111* 91  BUN 15 17  CREATININE 0.69 0.81  CALCIUM 8.4* 8.6*   PT/INR No results for input(s): LABPROT, INR in the last 72 hours.  Recent Labs  Lab 05/28/17 1106 05/30/17 0255  AST 24 22  ALT 35 36  ALKPHOS 122 94  BILITOT 1.0 1.0  PROT 6.7 6.0*  ALBUMIN 3.2* 3.2*     Lipase     Component Value Date/Time   LIPASE 18 01/26/2017 1512     Medications: . diltiazem  300 mg Oral Daily   . sodium chloride 75 mL/hr at 05/30/17 0600  . diltiazem (CARDIZEM) infusion 5 mg/hr (05/30/17 0730)  . piperacillin-tazobactam (ZOSYN)  IV Stopped (05/30/17 1012)    Assessment/Plan Stage IV lung cancer with metastasis/chemotherapy last Rx 05/21/17              - Dr. Lorna Few Right adrenal nodule Lateral renal calculi/urinary retention COPD/heavy tobacco use, quit  12/2016 History of atrial flutter -not on anticoagulants Hypertension BPH Hyperlipidemia Obesity  Acute diverticulitis versus appendicitis with microperforation Acute diverticulitis 12/2016  FEN: IV fluids/n.p.o. except sips with meds. ID: Zosyn 2/26 =>> day 3 DVT: SCDs only Foley: None Follow-up: To be determined  Plan:  I would give him clears today, continue current antibiotics.     WBC is up so we will watch that.  If it stays up or continues to rise may need to repeat CT later.  Original CT 2/26 without contrast.  He can have chemical DVT prophylaxis from our standpoint.     LOS: 2 days   JENNINGS,Aaron Sellers 05/30/2017 [phone]  Agree with above. WBC could be "rebound" coming off of chemotx. But he is doing well.  Will start clear liquids.  Son, Cesar, in room.  Alphonsa Overall, MD, [facility] Pager: [phone] Office phone:  [phone]

## 2017-05-30 NOTE — Progress Notes (Signed)
[facility] Progress Note   Karas Pickerill [id number] [birth year] 67 y.o.  Shail Urbas is managed by Dr. Eilleen Kempf  Actively treated with chemotherapy: yes  Current Therapy: Carboplatin, Taxol, and Keytruda with pegfilgrastim  Last Treated: 05/21/2017 (cycle 4, day 1)  Assessment: Plan:    Dysuria - Plan: Urinalysis, Complete w Microscopic, Urine Culture, CT RENAL STONE STUDY  Right lower quadrant abdominal pain - Plan: CT RENAL STONE STUDY  Perforated bowel ([place])   Dysuria: Urinalysis was completed and returned with hemoglobin returning is a large, bacteria rare, crystals present.  The patient was referred for a renal stone CT given the fact that his most recent restaging CT scans from 05/20/2017 returned showing bilateral renal calculi with a partially obstructing distal right ureteral calculus/calculi.  Right lower abdominal pain: The patient was referred for a renal stone CT.  Perforated bowel: A renal CT returned showing an inflammatory process in the right lower quadrant of the abdomen adjacent to the appendix endocervical diverticulae in the mid sigmoid colon.  It was uncertain as that this represented an acute diverticulitis or acute appendicitis.  There was extensive surrounding inflammatory changes, trace volume of adjacent fluid which likely represented development phlegmon.  There was clear evidence of perforation demonstrated by small volume of a pneumoperitoneum. Mr. Verge was taken to the ER for management.  Please see After Visit Summary for patient specific instructions.  Future Appointments  Date Time Provider [place]  06/04/2017 11:00 AM [facility] 1 [facility] None  06/05/2017  3:30 PM Strader, Fransisco Hertz, PA-C [facility]  06/11/2017 10:45 AM [facility] ONLY [facility] None  06/11/2017 11:15 AM Curt Bears, MD [facility] None  06/11/2017 12:30 PM [facility] A3 [facility] None  06/18/2017 11:00 AM  [facility] 3 [facility] None  06/25/2017 11:00 AM [facility] LAB 4 [facility] None  07/02/2017 10:30 AM [facility] 3 [facility] None  07/02/2017 11:00 AM Curt Bears, MD [facility] None  07/02/2017 12:15 PM [facility] H29 [facility] None  07/09/2017 11:15 AM [facility] LAB 1 [facility] None  07/16/2017 11:15 AM [facility] 2 [facility] None  07/23/2017  9:45 AM [facility] 1 [facility] None  07/23/2017 10:15 AM Curt Bears, MD [facility] None  07/23/2017 11:00 AM [facility] H30 [facility] None  07/30/2017 11:00 AM [facility] LAB 4 [facility] None  08/06/2017 11:15 AM [facility] 2 [facility] None  08/13/2017 10:45 AM [facility] 1 [facility] None  08/13/2017 11:15 AM Curt Bears, MD [facility] None  08/13/2017 12:15 PM [facility] H28 [facility] None    Orders Placed This Encounter  Procedures  . Urine Culture  . CT RENAL STONE STUDY  . Urinalysis, Complete w Microscopic       Subjective:   Patient ID:  Renel Ende is a 67 y.o. (DOB [birth year]) male.  Chief Complaint:  Chief Complaint  Patient presents with  . Abdominal Pain    HPI [place] Markgraf is a 67 year old male with a stage IV non-small cell lung cancer, squamous cell carcinoma who initially presented with a right upper lobe mass, right hilar and mediastinal adenopathy, and metastatic disease in the medial right thigh in November 2018.  He is currently treated with carboplatin, Taxol, and Keytruda and is status post cycle 4, day 1 of therapy dosed on 05/21/2017.  He presents to the office today with right lower back pain with radiation to his right medial pelvis.  He has had dark urine with an odor over this past weekend with a decrease in the  volume of urine produced.  He continues on Flomax at 0.4 mg daily.  He reports pressure in his lower pelvis.  He had a history of kidney stones approximately 5 years ago.  His most recent restaging CT scans completed on 05/20/2017 showed  bilateral renal calculi with development of a partially obstructing distal right ureteral calculus or calculi.  He reports having an episode of diarrhea on Monday with constipation prior to that.  He was taking Colace and senna S.  He had a good bowel movement on Monday.  He reports that he has had tingling in his feet and is started gabapentin twice daily.  He denies fevers but does acknowledges chills.  He has had no nausea or vomiting.  Medications: I have reviewed the patient's current medications.  Allergies:  Allergies  Allergen Reactions  . Ciprofloxacin     HIVES  . Adhesive [Tape] Other (See Comments)    Skin peels  . Codeine Other (See Comments)    Jitters, "skin crawling"    Past Medical History:  Diagnosis Date  . Arthritis   . Atrial flutter ([place])   . BPH (benign prostatic hypertrophy) with urinary retention   . Cancer ([place])   . Chronic kidney disease    hx of kidney stones  2011  . COPD (chronic obstructive pulmonary disease) ([place])    has been smoking since he was 22 ish--  . High cholesterol   . Hypertension    dx around 2011  . Squamous cell carcinoma of right lung [facility])     Past Surgical History:  Procedure Laterality Date  . HERNIA REPAIR     umbilical hernia  12/2117  . INCISION AND DRAINAGE ABSCESS     "down the middle" of his buttocks  2015  . TOTAL KNEE ARTHROPLASTY Left 10/29/2014   Procedure: TOTAL KNEE ARTHROPLASTY;  Surgeon: Dorna Leitz, MD;  Location: [facility];  Service: Orthopedics;  Laterality: Left;    Family History  Problem Relation Age of Onset  . Hypertension Mother   . CVA Father   . Lung cancer Sister   . Lung cancer Brother   . Hypertension Brother   . Hypertension Sister     Social History   Socioeconomic History  . Marital status: Significant Other    Spouse name: Not on file  . Number of children: Not on file  . Years of education: Not on file  . Highest education level: Not on file  Social Needs  . Financial resource  strain: Not on file  . Food insecurity - worry: Not on file  . Food insecurity - inability: Not on file  . Transportation needs - medical: Not on file  . Transportation needs - non-medical: Not on file  Occupational History  . Not on file  Tobacco Use  . Smoking status: Former Smoker    Packs/day: 1.00    Years: 40.00    Pack years: 40.00    Types: Cigarettes    Last attempt to quit: 01/27/2017    Years since quitting: 0.3  . Smokeless tobacco: Former Systems developer    Types: Snuff  Substance and Sexual Activity  . Alcohol use: No  . Drug use: No  . Sexual activity: Not Currently  Other Topics Concern  . Not on file  Social History Narrative  . Not on file    Past Medical History, Surgical history, Social history, and Family history were reviewed and updated as appropriate.   Please see review of systems for  further details on the patient's review from today.   Review of Systems:  Review of Systems  Constitutional: Positive for chills. Negative for appetite change, diaphoresis and fever.  Respiratory: Negative for cough, chest tightness and shortness of breath.   Cardiovascular: Negative for chest pain, palpitations and leg swelling.  Gastrointestinal: Positive for abdominal pain. Negative for abdominal distention, blood in stool, constipation, diarrhea, nausea and vomiting.  Genitourinary: Positive for decreased urine volume and flank pain. Negative for difficulty urinating.  Neurological: Negative for weakness.    Objective:   Physical Exam:  BP 140/80 (BP Location: Left Arm, Patient Position: Sitting)   Pulse 75   Temp 97.8 F (36.6 C) (Oral)   Resp 18   Ht 6\' 7"  (2.007 m)   Wt 289 lb 9.6 oz (131.4 kg)   SpO2 100%   BMI 32.62 kg/m  ECOG: 0  Physical Exam  Constitutional: No distress.  HENT:  Head: Normocephalic and atraumatic.  Mouth/Throat: Oropharynx is clear and moist.  Cardiovascular: Normal rate, regular rhythm and normal heart sounds. Exam reveals no gallop  and no friction rub.  No murmur heard. Pulmonary/Chest: Effort normal and breath sounds normal. No respiratory distress. He has no wheezes. He has no rales.  Abdominal: Soft. Bowel sounds are normal. He exhibits no distension and no mass. There is no tenderness. There is no rebound and no guarding.  Musculoskeletal: He exhibits no edema.  No bilateral CVA tenderness.  Neurological: He is alert. Coordination normal.  Skin: Skin is warm and dry. He is not diaphoretic.  Psychiatric: He has a normal mood and affect. His behavior is normal. Judgment and thought content normal.    Lab Review:     Component Value Date/Time   NA 139 05/30/2017 0255   NA 137 04/03/2017 1030   K 3.6 05/30/2017 0255   K 4.1 04/03/2017 1030   CL 105 05/30/2017 0255   CO2 27 05/30/2017 0255   CO2 27 04/03/2017 1030   GLUCOSE 91 05/30/2017 0255   GLUCOSE 93 04/03/2017 1030   BUN 17 05/30/2017 0255   BUN 15.5 04/03/2017 1030   CREATININE 0.81 05/30/2017 0255   CREATININE 0.8 04/03/2017 1030   CALCIUM 8.6 (L) 05/30/2017 0255   CALCIUM 9.4 04/03/2017 1030   PROT 6.0 (L) 05/30/2017 0255   PROT 7.0 04/03/2017 1030   ALBUMIN 3.2 (L) 05/30/2017 0255   ALBUMIN 3.6 04/03/2017 1030   AST 22 05/30/2017 0255   AST 14 04/03/2017 1030   ALT 36 05/30/2017 0255   ALT 21 04/03/2017 1030   ALKPHOS 94 05/30/2017 0255   ALKPHOS 97 04/03/2017 1030   BILITOT 1.0 05/30/2017 0255   BILITOT 0.67 04/03/2017 1030   GFRNONAA >60 05/30/2017 0255   GFRAA >60 05/30/2017 0255       Component Value Date/Time   WBC 15.1 (H) 05/30/2017 0255   RBC 3.96 (L) 05/30/2017 0255   HGB 11.7 (L) 05/30/2017 0255   HGB 13.5 04/03/2017 1030   HCT 36.1 (L) 05/30/2017 0255   HCT 40.7 04/03/2017 1030   PLT 179 05/30/2017 0255   PLT 261 04/03/2017 1030   MCV 91.2 05/30/2017 0255   MCV 84.9 04/03/2017 1030   MCH 29.5 05/30/2017 0255   MCHC 32.4 05/30/2017 0255   RDW 16.1 (H) 05/30/2017 0255   RDW 14.6 04/03/2017 1030   LYMPHSABS 1.8  05/30/2017 0255   LYMPHSABS 1.9 04/03/2017 1030   MONOABS 1.2 05/30/2017 0255   MONOABS 1.3 (H) 04/03/2017 1030   EOSABS  0.1 05/30/2017 0255   EOSABS 0.2 04/03/2017 1030   BASOSABS 0.1 05/30/2017 0255   BASOSABS 0.1 04/03/2017 1030   -------------------------------  Imaging from last 24 hours (if applicable):  Radiology interpretation: Ct Chest W Contrast  Result Date: 05/20/2017 CLINICAL DATA:  Non-small-cell lung cancer with metastatic disease to the right thigh. Post radiation therapy. EXAM: CT CHEST, ABDOMEN, AND PELVIS WITH CONTRAST TECHNIQUE: Multidetector CT imaging of the chest, abdomen and pelvis was performed following the standard protocol during bolus administration of intravenous contrast. CONTRAST:  118mL ISOVUE-300 IOPAMIDOL (ISOVUE-300) INJECTION 61% COMPARISON:  PET-CT 02/15/2017. Abdominopelvic CT 01/26/2017 and chest CT 02/27/2017. FINDINGS: CT CHEST FINDINGS Cardiovascular: No acute vascular findings are seen. There is atherosclerosis of the aorta, great vessels and coronary arteries. The heart size is normal. There is no pericardial effusion. Mediastinum/Nodes: Previously demonstrated right paratracheal and right hilar adenopathy has improved. Right hilar node measures 2.5 x 1.6 cm on image 26 (previously 3.7 x 3.0 cm). 13 mm short axis subcarinal node on image 27 previously measured 14 mm. There is no axillary adenopathy. The thyroid gland, trachea and esophagus demonstrate no significant findings. Lungs/Pleura: There is no pleural effusion. Interval decreased size and cavitation of dominant right upper lobe mass, now measuring 2.2 x 2.0 cm on image 37 (previously 3.3 x 2.7 cm). Tiny left lung nodules on images 31 and 61 are stable. No new or enlarging pulmonary nodules. Musculoskeletal/Chest wall: No chest wall mass or suspicious osseous findings. CT ABDOMEN AND PELVIS FINDINGS Hepatobiliary: The liver is normal in density without focal abnormality. No evidence of gallstones,  gallbladder wall thickening or biliary dilatation. Pancreas: Unremarkable. No pancreatic ductal dilatation or surrounding inflammatory changes. Spleen: Normal in size without focal abnormality. Adrenals/Urinary Tract: 3.7 x 2.1 cm right adrenal nodule on image 59 appears unchanged from the baseline study (as remeasured). The left adrenal gland appears normal. There are small nonobstructing bilateral renal calculi. There is an obstructing calculus in the distal right ureter, measuring 5 mm on image 120. This may reflect 2 adjacent stones based on the reformatted images, lying just proximal to the ureterovesical junction. There is mild associated ureterectasis, periureteral soft tissue stranding and delayed contrast excretion. A cyst in the interpolar region of the right kidney appears unchanged. The bladder appears normal. Stomach/Bowel: No evidence of bowel wall thickening, distention or surrounding inflammatory change. The inflammation of the sigmoid colon in the right lower quadrant on the prior study has resolved. The appendix appears normal. Vascular/Lymphatic: There are stable mildly prominent lymph nodes in the porta hepatis and gastrohepatic ligament. There is aortic and branch vessel atherosclerosis with stable mild enlargement of the infrarenal abdominal aorta. No acute vascular findings. Reproductive: The prostate gland appears stable. Other: No evidence of abdominal wall hernia or mass. No ascites or peritoneal nodularity. Musculoskeletal: No acute or significant osseous findings. Right lower extremity findings dictated separately. IMPRESSION: 1. Interval response to therapy in the thoracic findings. The dominant right upper lobe mass has decreased in size and become cavitary. The right hilar and mediastinal adenopathy has improved. 2. Stable right adrenal nodule, likely an adenoma. No signs of metastatic disease in the abdomen or pelvis. 3. Bilateral renal calculi with development of a partially  obstructing distal right ureteral calculus or calculi. 4. Interval resolution of sigmoid diverticulitis. Aortic Atherosclerosis (ICD10-I70.0). 5. These results will be called to the ordering clinician or representative by the Radiologist Assistant, and communication documented in the PACS or zVision Dashboard. Electronically Signed   By: Gwyndolyn Saxon  Lin Landsman M.D.   On: 05/20/2017 14:13   Ct Abdomen Pelvis W Contrast  Result Date: 05/20/2017 CLINICAL DATA:  Non-small-cell lung cancer with metastatic disease to the right thigh. Post radiation therapy. EXAM: CT CHEST, ABDOMEN, AND PELVIS WITH CONTRAST TECHNIQUE: Multidetector CT imaging of the chest, abdomen and pelvis was performed following the standard protocol during bolus administration of intravenous contrast. CONTRAST:  154mL ISOVUE-300 IOPAMIDOL (ISOVUE-300) INJECTION 61% COMPARISON:  PET-CT 02/15/2017. Abdominopelvic CT 01/26/2017 and chest CT 02/27/2017. FINDINGS: CT CHEST FINDINGS Cardiovascular: No acute vascular findings are seen. There is atherosclerosis of the aorta, great vessels and coronary arteries. The heart size is normal. There is no pericardial effusion. Mediastinum/Nodes: Previously demonstrated right paratracheal and right hilar adenopathy has improved. Right hilar node measures 2.5 x 1.6 cm on image 26 (previously 3.7 x 3.0 cm). 13 mm short axis subcarinal node on image 27 previously measured 14 mm. There is no axillary adenopathy. The thyroid gland, trachea and esophagus demonstrate no significant findings. Lungs/Pleura: There is no pleural effusion. Interval decreased size and cavitation of dominant right upper lobe mass, now measuring 2.2 x 2.0 cm on image 37 (previously 3.3 x 2.7 cm). Tiny left lung nodules on images 31 and 61 are stable. No new or enlarging pulmonary nodules. Musculoskeletal/Chest wall: No chest wall mass or suspicious osseous findings. CT ABDOMEN AND PELVIS FINDINGS Hepatobiliary: The liver is normal in density without  focal abnormality. No evidence of gallstones, gallbladder wall thickening or biliary dilatation. Pancreas: Unremarkable. No pancreatic ductal dilatation or surrounding inflammatory changes. Spleen: Normal in size without focal abnormality. Adrenals/Urinary Tract: 3.7 x 2.1 cm right adrenal nodule on image 59 appears unchanged from the baseline study (as remeasured). The left adrenal gland appears normal. There are small nonobstructing bilateral renal calculi. There is an obstructing calculus in the distal right ureter, measuring 5 mm on image 120. This may reflect 2 adjacent stones based on the reformatted images, lying just proximal to the ureterovesical junction. There is mild associated ureterectasis, periureteral soft tissue stranding and delayed contrast excretion. A cyst in the interpolar region of the right kidney appears unchanged. The bladder appears normal. Stomach/Bowel: No evidence of bowel wall thickening, distention or surrounding inflammatory change. The inflammation of the sigmoid colon in the right lower quadrant on the prior study has resolved. The appendix appears normal. Vascular/Lymphatic: There are stable mildly prominent lymph nodes in the porta hepatis and gastrohepatic ligament. There is aortic and branch vessel atherosclerosis with stable mild enlargement of the infrarenal abdominal aorta. No acute vascular findings. Reproductive: The prostate gland appears stable. Other: No evidence of abdominal wall hernia or mass. No ascites or peritoneal nodularity. Musculoskeletal: No acute or significant osseous findings. Right lower extremity findings dictated separately. IMPRESSION: 1. Interval response to therapy in the thoracic findings. The dominant right upper lobe mass has decreased in size and become cavitary. The right hilar and mediastinal adenopathy has improved. 2. Stable right adrenal nodule, likely an adenoma. No signs of metastatic disease in the abdomen or pelvis. 3. Bilateral renal  calculi with development of a partially obstructing distal right ureteral calculus or calculi. 4. Interval resolution of sigmoid diverticulitis. Aortic Atherosclerosis (ICD10-I70.0). 5. These results will be called to the ordering clinician or representative by the Radiologist Assistant, and communication documented in the PACS or zVision Dashboard. Electronically Signed   By: Richardean Sale M.D.   On: 05/20/2017 14:13   Ct Femur Right W Contrast  Result Date: 05/20/2017 CLINICAL DATA:  The patient reports a  palpable lesion on the right upper leg. History of lung carcinoma. EXAM: CT OF THE LOWER RIGHT EXTREMITY WITH CONTRAST TECHNIQUE: Multidetector CT imaging of the lower right extremity was performed according to the standard protocol following intravenous contrast administration. COMPARISON:  None. CONTRAST:  100 cc Isovue-300. FINDINGS: Bones/Joint/Cartilage No acute abnormality is identified. No focal lesion is seen. There is some degenerative change about the knee appearing most notable in the medial compartment. Ligaments Suboptimally assessed by CT. Muscles and Tendons Appear normal. No intramuscular mass or fluid collection. No tear or strain is seen. Soft tissues Negative for mass or fluid collection. Atherosclerotic vascular disease noted. Bilateral hydroceles are seen, larger on the right. IMPRESSION: Negative for mass or fluid collection. No finding to explain the patient's palpated abnormality is identified. Atherosclerosis. Right greater than left hydroceles. Osteoarthritis right knee. Electronically Signed   By: Inge Rise M.D.   On: 05/20/2017 12:43   Ct Renal Stone Study  Result Date: 05/28/2017 CLINICAL DATA:  67 year old male with history of right-sided groin and back pain since 05/25/2017. Gross hematuria. EXAM: CT ABDOMEN AND PELVIS WITHOUT CONTRAST TECHNIQUE: Multidetector CT imaging of the abdomen and pelvis was performed following the standard protocol without IV contrast.  COMPARISON:  CT the chest, abdomen and pelvis 05/20/2017. FINDINGS: Lower chest: Subtle areas of septal thickening throughout the visualize lung bases. Atherosclerotic calcifications in the left anterior descending and right coronary arteries. Hepatobiliary: Mild diffuse low attenuation throughout the hepatic parenchyma, compatible with a background of hepatic steatosis. Liver has a slightly shrunken appearance and nodular contour, indicative of underlying cirrhosis. No definite cystic or solid hepatic lesions are confidently identified on today's noncontrast CT examination. Unenhanced appearance of the gallbladder is normal. Pancreas: No definite pancreatic mass or peripancreatic fluid or inflammatory changes are noted on today's noncontrast CT examination. Spleen: Unremarkable. Adrenals/Urinary Tract: 2.8 x 2.1 cm low-attenuation (5 HU) right adrenal nodule, similar to the prior examination, compatible with a adenoma. Left adrenal gland is normal in appearance. In the lateral aspect of the upper pole the right kidney there is a 2.2 cm low-attenuation lesion which is incompletely characterized on today's noncontrast CT examination, but was compatible with a simple cyst on the recent prior study. Multiple nonobstructive calculi are noted within the collecting systems of both kidneys measuring 2-3 mm in size. In addition, in the distal third of the right ureter shortly before the right ureterovesicular junction (axial image 91 of series 2 and coronal image 133 of series 6) there are 2 adjacent tiny calculi measuring 3 mm. No significant proximal right hydroureteronephrosis. The ureter around the distal calculi does appear to be thickened, presumably inflamed. No left ureteral stones. No bladder stones. Unenhanced appearance of the urinary bladder is unremarkable. Stomach/Bowel: Unenhanced appearance of the stomach is normal. No pathologic dilatation of small bowel or colon. Numerous colonic diverticulae are again  noted, most evident in the sigmoid colon. In the right lower quadrant adjacent to both a loop of the sigmoid colon and adjacent to the appendix there is extensive inflammation with a trace amount of fluid (axial image 70 of series 3) without a well-defined abscess. These findings could reflect either an acute diverticulitis or an acute appendicitis. Notably, there is a small amount of pneumoperitoneum, indicative of perforation. Vascular/Lymphatic: Aortic atherosclerosis with fusiform ectasia of the infrarenal abdominal aorta which measures up to 2.8 cm in diameter. No lymphadenopathy noted in the abdomen or pelvis. Reproductive: Prostate gland and seminal vesicles are unremarkable in appearance. Other: No significant  volume of ascites. Trace volume of pneumoperitoneum. Musculoskeletal: There are no aggressive appearing lytic or blastic lesions noted in the visualized portions of the skeleton. IMPRESSION: 1. Inflammatory process in the right lower quadrant of the abdomen adjacent to the appendix and to several diverticulae in the mid sigmoid colon. It is uncertain whether or not this represents an acute diverticulitis or an acute appendicitis. Regardless, there are extensive surrounding inflammatory changes, there is a trace volume of adjacent fluid which likely reflects a developing phlegmon, and there is clear evidence of perforation demonstrated by small volume of pneumoperitoneum. Emergent surgical consultation is strongly recommended. 2. Two tiny 3 mm calculi in the distal third of the right ureter shortly before the right ureterovesicular junction. No proximal hydroureteronephrosis at this time. Additional tiny 2-3 mm nonobstructive calculi are noted within the collecting systems of both kidneys. 3. Aortic atherosclerosis. 4. Subtle changes in the lung bases bilaterally which could be indicative of early or mild interstitial lung disease. Follow-up nonemergent high-resolution chest CT is suggested in the next  several months to better evaluate these findings. 5. Hepatic steatosis with evidence of underlying hepatic cirrhosis, as above. 6. Stable right adrenal adenoma. Aortic Atherosclerosis (ICD10-I70.0). Critical Value/emergent results were called by telephone at the time of interpretation on 05/28/2017 at 2:34 pm to [place] , who verbally acknowledged these results. Electronically Signed   By: Vinnie Langton M.D.   On: 05/28/2017 14:34        This case was discussed with Dr. Julien Nordmann. He expressed agreement with my management of this patient.

## 2017-05-31 ENCOUNTER — Other Ambulatory Visit: Payer: Self-pay

## 2017-05-31 LAB — CBC
HEMATOCRIT: 36.8 % — AB (ref 39.0–52.0)
Hemoglobin: 12.1 g/dL — ABNORMAL LOW (ref 13.0–17.0)
MCH: 29.8 pg (ref 26.0–34.0)
MCHC: 32.9 g/dL (ref 30.0–36.0)
MCV: 90.6 fL (ref 78.0–100.0)
Platelets: 185 10*3/uL (ref 150–400)
RBC: 4.06 MIL/uL — ABNORMAL LOW (ref 4.22–5.81)
RDW: 16.1 % — ABNORMAL HIGH (ref 11.5–15.5)
WBC: 17.4 10*3/uL — ABNORMAL HIGH (ref 4.0–10.5)

## 2017-05-31 LAB — COMPREHENSIVE METABOLIC PANEL
ALK PHOS: 105 U/L (ref 38–126)
ALT: 29 U/L (ref 17–63)
AST: 21 U/L (ref 15–41)
Albumin: 3.1 g/dL — ABNORMAL LOW (ref 3.5–5.0)
Anion gap: 7 (ref 5–15)
BILIRUBIN TOTAL: 0.5 mg/dL (ref 0.3–1.2)
BUN: 10 mg/dL (ref 6–20)
CALCIUM: 8.8 mg/dL — AB (ref 8.9–10.3)
CHLORIDE: 105 mmol/L (ref 101–111)
CO2: 27 mmol/L (ref 22–32)
CREATININE: 0.72 mg/dL (ref 0.61–1.24)
Glucose, Bld: 104 mg/dL — ABNORMAL HIGH (ref 65–99)
Potassium: 3.9 mmol/L (ref 3.5–5.1)
Sodium: 139 mmol/L (ref 135–145)
TOTAL PROTEIN: 6 g/dL — AB (ref 6.5–8.1)

## 2017-05-31 MED ORDER — DILTIAZEM HCL ER COATED BEADS 180 MG PO CP24
360.0000 mg | ORAL_CAPSULE | Freq: Every day | ORAL | Status: DC
  Administered 2017-06-01: 360 mg via ORAL
  Filled 2017-05-31: qty 2

## 2017-05-31 MED ORDER — AMOXICILLIN-POT CLAVULANATE 500-125 MG PO TABS
1.0000 | ORAL_TABLET | Freq: Three times a day (TID) | ORAL | Status: DC
  Administered 2017-05-31 – 2017-06-03 (×9): 500 mg via ORAL
  Filled 2017-05-31 (×10): qty 1

## 2017-05-31 MED ORDER — GABAPENTIN 100 MG PO CAPS
100.0000 mg | ORAL_CAPSULE | Freq: Three times a day (TID) | ORAL | Status: DC
  Administered 2017-05-31 – 2017-06-03 (×9): 100 mg via ORAL
  Filled 2017-05-31 (×9): qty 1

## 2017-05-31 MED ORDER — SODIUM CHLORIDE 0.9 % IV BOLUS (SEPSIS)
250.0000 mL | Freq: Once | INTRAVENOUS | Status: AC
  Administered 2017-05-31: 250 mL via INTRAVENOUS

## 2017-05-31 MED ORDER — METOPROLOL TARTRATE 5 MG/5ML IV SOLN
5.0000 mg | Freq: Once | INTRAVENOUS | Status: AC
  Administered 2017-05-31: 5 mg via INTRAVENOUS
  Filled 2017-05-31: qty 5

## 2017-05-31 MED ORDER — DILTIAZEM HCL ER 60 MG PO CP12
60.0000 mg | ORAL_CAPSULE | Freq: Once | ORAL | Status: AC
  Administered 2017-05-31: 60 mg via ORAL
  Filled 2017-05-31: qty 1

## 2017-05-31 MED ORDER — ATORVASTATIN CALCIUM 10 MG PO TABS
10.0000 mg | ORAL_TABLET | Freq: Every evening | ORAL | Status: DC
  Administered 2017-05-31 – 2017-06-02 (×3): 10 mg via ORAL
  Filled 2017-05-31 (×3): qty 1

## 2017-05-31 MED ORDER — METOPROLOL TARTRATE 25 MG PO TABS
25.0000 mg | ORAL_TABLET | Freq: Two times a day (BID) | ORAL | Status: DC
  Administered 2017-05-31 – 2017-06-01 (×2): 25 mg via ORAL
  Filled 2017-05-31 (×2): qty 1

## 2017-05-31 MED ORDER — DILTIAZEM HCL 25 MG/5ML IV SOLN
10.0000 mg | Freq: Once | INTRAVENOUS | Status: AC
  Administered 2017-05-31: 10 mg via INTRAVENOUS
  Filled 2017-05-31: qty 5

## 2017-05-31 MED ORDER — METOPROLOL TARTRATE 5 MG/5ML IV SOLN: 5.0000 mg | Freq: Once | INTRAVENOUS | Status: DC

## 2017-05-31 MED ORDER — SODIUM CHLORIDE 0.9 % IV BOLUS (SEPSIS)
500.0000 mL | Freq: Once | INTRAVENOUS | Status: AC
  Administered 2017-05-31: 500 mL via INTRAVENOUS

## 2017-05-31 MED ORDER — TAMSULOSIN HCL 0.4 MG PO CAPS
0.4000 mg | ORAL_CAPSULE | Freq: Every day | ORAL | Status: DC
  Administered 2017-05-31 – 2017-06-03 (×4): 0.4 mg via ORAL
  Filled 2017-05-31 (×4): qty 1

## 2017-05-31 NOTE — Progress Notes (Signed)
HR 130's-140's sustaining. Patient asymptomatic.  MD notified via text page.

## 2017-05-31 NOTE — Progress Notes (Addendum)
    CC:  Abdominal pain  Subjective: He looks really good, tolerating clears, no pain.  Some recurrent AF last PM, he cannot tell.    Objective: Vital signs in last 24 hours: Temp:  [97.8 F (36.6 C)-98.2 F (36.8 C)] 98.2 F (36.8 C) (03/01 0431) Pulse Rate:  [55-137] 70 (03/01 0546) Resp:  [18-25] 20 (03/01 0431) BP: (110-148)/(56-88) 125/87 (03/01 0431) SpO2:  [90 %-99 %] 98 % (03/01 0431) Weight:  [130.8 kg (288 lb 6.4 oz)] 130.8 kg (288 lb 6.4 oz) (03/01 0433) Last BM Date: 05/30/17 PO not recorded 1683 IV Out put not recorded  Afebrile,  He had a run of AF and converted back to SR. AM labs pending  Intake/Output from previous day: 02/28 0701 - 03/01 0700 In: 1683.7 [I.V.:1533.7; IV Piggyback:150] Out: -  Intake/Output this shift: No intake/output data recorded.  General appearance: alert, cooperative and no distress GI: soft, non-tender; bowel sounds normal; no masses,  no organomegaly  Lab Results:  Recent Labs    05/29/17 0316 05/30/17 0255  WBC 9.7 15.1*  HGB 11.9* 11.7*  HCT 35.1* 36.1*  PLT 141* 179    BMET Recent Labs    05/29/17 0316 05/30/17 0255  NA 137 139  K 3.5 3.6  CL 104 105  CO2 25 27  GLUCOSE 111* 91  BUN 15 17  CREATININE 0.69 0.81  CALCIUM 8.4* 8.6*   PT/INR No results for input(s): LABPROT, INR in the last 72 hours.  Recent Labs  Lab 05/28/17 1106 05/30/17 0255  AST 24 22  ALT 35 36  ALKPHOS 122 94  BILITOT 1.0 1.0  PROT 6.7 6.0*  ALBUMIN 3.2* 3.2*     Lipase     Component Value Date/Time   LIPASE 18 01/26/2017 1512     Medications: . diltiazem  300 mg Oral Daily    Assessment/Plan Stage IV lung cancer with metastasis/chemotherapy last Rx 05/21/17 -Dr. Lorna Few Right adrenal nodule Lateral renal calculi/urinary retention COPD/heavy tobacco use, quit 12/2016 History of atrial flutter-not on anticoagulants Hypertension BPH Hyperlipidemia Obesity  Acute diverticulitis  versus appendicitis with microperforation Acute diverticulitis 12/2016  FEN:IV fluids/n.p.o. except sips with meds. ID: Zosyn 2/26=>> day 3 DVT: SCDs only Foley: None Follow-up: To be determined  Plan:  I would advance his diet, and continue antibiotics. CBC is pending.   I would continue Medical management of his diverticulitis.     LOS: 3 days    Sellers,Aaron 05/31/2017 [phone]  Agree with above. WBC up some today - 17,400 - 05/31/2017. This may be a reflection being further from his chemotherapy.  In spite of WBC, he seems to be progressing well. Significant other, Aaron Sellers, in room.  Alphonsa Overall, MD, [facility] Surgery Pager: [phone] Office phone:  [phone]

## 2017-05-31 NOTE — Progress Notes (Signed)
Pt walked to bathroom and HR went from SR 70s to sustaining ST 120's to High 130's while resting in bed. Pt denies SOB or heart racing. Vital signs stable. EKG obtained and placed in chart. On call Bodenheimer notified and made aware. Awaiting orders.

## 2017-05-31 NOTE — Progress Notes (Signed)
Patient Aaron Sellers's sustained, Afib/Aflutter rhythm.  BP 108/45. Patient asymptomatic.  MD notified via text page.

## 2017-05-31 NOTE — Progress Notes (Signed)
[facility] Progress Note  Patient: Aaron Sellers KZS:[id number]   PCP: Sheryle Hail, PA-C DOB: 17-Aug-1950   DOA: 05/28/2017   DOS: 05/31/2017   Date of Service: the patient was seen and examined on 05/31/2017  Subjective: Feeling better.  Still no abdominal pain no nausea or vomiting.  Tolerating oral diet.  Passing gas.  Brief hospital course: Pt. with PMH of stage IV metastatic lung cancer, COPD, chronic kidney disease, a flutter, hypertension; admitted on 05/28/2017, presented with complaint of abdominal pain, was found to have bowel perforation with acute diverticulitis as well as SVT. Currently further plan is continue current treatment.  Assessment and Plan:   Perforated diverticulitis- She was started on IV Zosyn, now we will transition to oral Augmentin. Initially patient was n.p.o. now transitioning him to full liquid diet.  Patient's abdomen exam is benign and patient not toxic appearing.   General surgery consulted. Appreciate input. Conservative management recommended.  Active Problems:   Atrial flutter with RVR ([place])- patient has failed carotid massage along with Valsalva and several doses of adenosine.   Almyra Free patient was on Cardizem infusion and was actually in the stepdown unit for the same. Now back on his home oral dose of Cardizem.  Has flipped into flutter with RVR twice on 05/31/2017.  Based on this I will start the patient on oral Lopressor as well as increased dose of Cardizem from 300 mg to 360 mg. Gust with cardiology, feel given his acute illness not a candidate for long-term anticoagulation but will require outpatient monitoring. Also recommend that have a discussion with PCP and oncology and have goals of care discussion before embarking on extensive cardiology workup. We will get echocardiogram in the hospital.    COPD (chronic obstructive pulmonary disease) (HCC)-stable at this time.  Continue neb treatments and supplemental oxygen as needed   Hypertension-stable    Chronic kidney disease-stable with normal creatinine at this time    RLQ abdominal pain-as above    Stage IV squamous cell carcinoma of right lung (HCC)-continue outpatient follow-up with Dr. Julien Nordmann once he gets over this current medical infectious issue  Diet: liquid diet DVT Prophylaxis: subcutaneous Heparin  Advance goals of care discussion: full code  Family Communication: family was present at bedside, at the time of interview. The pt provided permission to discuss medical plan with the family. Opportunity was given to ask question and all questions were answered satisfactorily.   Disposition:  Discharge to home.  Consultants: General surgery oncology  Procedures: none  Antibiotics: Anti-infectives (From admission, onward)   Start     Dose/Rate Route Frequency Ordered Stop   05/31/17 1600  amoxicillin-clavulanate (AUGMENTIN) 500-125 MG per tablet 500 mg     1 tablet Oral 3 times daily 05/31/17 1117     05/28/17 2200  piperacillin-tazobactam (ZOSYN) IVPB 3.375 g  Status:  Discontinued     3.375 g 12.5 mL/hr over 240 Minutes Intravenous Every 8 hours 05/28/17 1715 05/31/17 1117   05/28/17 1615  piperacillin-tazobactam (ZOSYN) IVPB 3.375 g     3.375 g 100 mL/hr over 30 Minutes Intravenous  Once 05/28/17 1604 05/28/17 1649       Objective: Physical Exam: Vitals:   05/31/17 0546 05/31/17 1145 05/31/17 1411 05/31/17 1800  BP:  108/85 130/67 112/74  Pulse: 70 (!) 141 67 (!) 141  Resp:   18   Temp:   98.1 F (36.7 C)   TempSrc:   Oral   SpO2:   98%   Weight:  Height:        Intake/Output Summary (Last 24 hours) at 05/31/2017 1843 Last data filed at 05/31/2017 1412 Gross per 24 hour  Intake 1865 ml  Output -  Net 1865 ml   Filed Weights   05/29/17 0150 05/30/17 0600 05/31/17 0433  Weight: 130 kg (286 lb 9.6 oz) 129.8 kg (286 lb 2.5 oz) 130.8 kg (288 lb 6.4 oz)   General: Alert, Awake and Oriented to Time, Place and Person. Appear in  mild distress, affect appropriate Eyes: PERRL, Conjunctiva normal ENT: Oral Mucosa clear moist. Neck: no JVD, no Abnormal Mass Or lumps Cardiovascular: S1 and S2 Present, no Murmur, Peripheral Pulses Present Respiratory: normal respiratory effort, Bilateral Air entry equal and Decreased, no use of accessory muscle, Clear to Auscultation, no Crackles, no wheezes Abdomen: Bowel Sound present, Soft and no tenderness, no hernia Skin: no redness, no Rash, no induration Extremities: no Pedal edema, no calf tenderness Neurologic: Grossly no focal neuro deficit. Bilaterally Equal motor strength  Data Reviewed: CBC: Recent Labs  Lab 05/28/17 1106 05/29/17 0316 05/30/17 0255 05/31/17 0744  WBC 8.2 9.7 15.1* 17.4*  NEUTROABS 6.0  --  12.0  --   HGB 12.8* 11.9* 11.7* 12.1*  HCT 38.1* 35.1* 36.1* 36.8*  MCV 88.0 90.0 91.2 90.6  PLT 132* 141* 179 509   Basic Metabolic Panel: Recent Labs  Lab 05/28/17 1106 05/29/17 0316 05/30/17 0255 05/31/17 0744  NA 137 137 139 139  K 4.1 3.5 3.6 3.9  CL 101 104 105 105  CO2 28 25 27 27   GLUCOSE 135 111* 91 104*  BUN 14 15 17 10   CREATININE 0.80 0.69 0.81 0.72  CALCIUM 9.4 8.4* 8.6* 8.8*  MG  --   --  1.9  --     Liver Function Tests: Recent Labs  Lab 05/28/17 1106 05/30/17 0255 05/31/17 0744  AST 24 22 21   ALT 35 36 29  ALKPHOS 122 94 105  BILITOT 1.0 1.0 0.5  PROT 6.7 6.0* 6.0*  ALBUMIN 3.2* 3.2* 3.1*   No results for input(s): LIPASE, AMYLASE in the last 168 hours. No results for input(s): AMMONIA in the last 168 hours. Coagulation Profile: No results for input(s): INR, PROTIME in the last 168 hours. Cardiac Enzymes: No results for input(s): CKTOTAL, CKMB, CKMBINDEX, TROPONINI in the last 168 hours. BNP (last 3 results) No results for input(s): PROBNP in the last 8760 hours. CBG: No results for input(s): GLUCAP in the last 168 hours. Studies: No results found.  Scheduled Meds: . amoxicillin-clavulanate  1 tablet Oral TID    . atorvastatin  10 mg Oral QPM  . [START ON 06/01/2017] diltiazem  360 mg Oral Daily  . gabapentin  100 mg Oral TID  . metoprolol tartrate  25 mg Oral BID  . tamsulosin  0.4 mg Oral Daily   Continuous Infusions: . sodium chloride 75 mL/hr at 05/31/17 1634   PRN Meds: acetaminophen, HYDROcodone-acetaminophen, lip balm, ondansetron **OR** ondansetron (ZOFRAN) IV  Time spent: 35 minutes  Author: Berle Mull, MD [facility] Pager: [phone] 05/31/2017 6:43 PM  If 7PM-7AM, please contact night-coverage at [email or web address], password [facility]

## 2017-06-01 ENCOUNTER — Inpatient Hospital Stay (HOSPITAL_COMMUNITY): Payer: Managed Care, Other (non HMO)

## 2017-06-01 DIAGNOSIS — I340 Nonrheumatic mitral (valve) insufficiency: Secondary | ICD-10-CM

## 2017-06-01 DIAGNOSIS — I4892 Unspecified atrial flutter: Secondary | ICD-10-CM

## 2017-06-01 LAB — BASIC METABOLIC PANEL
Anion gap: 7 (ref 5–15)
BUN: 6 mg/dL (ref 6–20)
CO2: 28 mmol/L (ref 22–32)
CREATININE: 0.69 mg/dL (ref 0.61–1.24)
Calcium: 8.5 mg/dL — ABNORMAL LOW (ref 8.9–10.3)
Chloride: 107 mmol/L (ref 101–111)
GFR calc Af Amer: >60 mL/min (ref >60)
GLUCOSE: 104 mg/dL — AB (ref 65–99)
Potassium: 3.8 mmol/L (ref 3.5–5.1)
Sodium: 142 mmol/L (ref 135–145)

## 2017-06-01 LAB — ECHOCARDIOGRAM COMPLETE
CHL CUP DOP CALC LVOT VTI: 15.1 cm
CHL CUP TV REG PEAK VELOCITY: 238 cm/s
EWDT: 148 ms
FS: 30 % (ref 28–44)
HEIGHTINCHES: 79 in
IV/PV OW: 1.06
LA vol A4C: 34.2 ml
LA vol: 36.2 mL
LADIAMINDEX: 1.39 cm/m2
LASIZE: 37 mm
LAVOLIN: 13.6 mL/m2
LEFT ATRIUM END SYS DIAM: 37 mm
LVOT area: 4.15 cm2
LVOT peak vel: 86 cm/s
LVOTD: 23 mm
LVOTSV: 63 mL
MV Dec: 148
MV Peak grad: 4 mmHg
MV pk E vel: 104 m/s
PW: 11.5 mm — AB (ref 0.6–1.1)
RV LATERAL S' VELOCITY: 16.3 cm/s
TAPSE: 17.5 mm
TRMAXVEL: 238 cm/s
WEIGHTICAEL: 4649.06 [oz_av]

## 2017-06-01 LAB — CBC
HEMATOCRIT: 35.6 % — AB (ref 39.0–52.0)
Hemoglobin: 11.6 g/dL — ABNORMAL LOW (ref 13.0–17.0)
MCH: 29.7 pg (ref 26.0–34.0)
MCHC: 32.6 g/dL (ref 30.0–36.0)
MCV: 91.3 fL (ref 78.0–100.0)
Platelets: 204 10*3/uL (ref 150–400)
RBC: 3.9 MIL/uL — ABNORMAL LOW (ref 4.22–5.81)
RDW: 16.2 % — AB (ref 11.5–15.5)
WBC: 17.4 10*3/uL — ABNORMAL HIGH (ref 4.0–10.5)

## 2017-06-01 LAB — MAGNESIUM: MAGNESIUM: 1.6 mg/dL — AB (ref 1.7–2.4)

## 2017-06-01 MED ORDER — METOPROLOL TARTRATE 5 MG/5ML IV SOLN: 2.5000 mg | INTRAVENOUS | Status: DC | PRN

## 2017-06-01 MED ORDER — SODIUM CHLORIDE 0.9 % IV BOLUS (SEPSIS)
1000.0000 mL | Freq: Once | INTRAVENOUS | Status: AC
  Administered 2017-06-01: 1000 mL via INTRAVENOUS

## 2017-06-01 MED ORDER — SODIUM CHLORIDE 0.9 % IV BOLUS (SEPSIS)
250.0000 mL | Freq: Once | INTRAVENOUS | Status: AC
  Administered 2017-06-01: 250 mL via INTRAVENOUS

## 2017-06-01 MED ORDER — DILTIAZEM HCL 100 MG IV SOLR
5.0000 mg/h | INTRAVENOUS | Status: DC
  Administered 2017-06-01: 5 mg/h via INTRAVENOUS
  Filled 2017-06-01: qty 100

## 2017-06-01 MED ORDER — AMIODARONE HCL IN DEXTROSE 360-4.14 MG/200ML-% IV SOLN
60.0000 mg/h | INTRAVENOUS | Status: AC
  Administered 2017-06-01: 60 mg/h via INTRAVENOUS
  Filled 2017-06-01: qty 200

## 2017-06-01 MED ORDER — AMIODARONE LOAD VIA INFUSION
150.0000 mg | Freq: Once | INTRAVENOUS | Status: AC
  Administered 2017-06-01: 150 mg via INTRAVENOUS
  Filled 2017-06-01: qty 83.34

## 2017-06-01 MED ORDER — MAGNESIUM SULFATE 2 GM/50ML IV SOLN
2.0000 g | Freq: Once | INTRAVENOUS | Status: AC
  Administered 2017-06-01: 2 g via INTRAVENOUS
  Filled 2017-06-01: qty 50

## 2017-06-01 MED ORDER — AMIODARONE HCL IN DEXTROSE 360-4.14 MG/200ML-% IV SOLN
30.0000 mg/h | INTRAVENOUS | Status: DC
  Administered 2017-06-01 – 2017-06-02 (×3): 30 mg/h via INTRAVENOUS
  Filled 2017-06-01 (×5): qty 200

## 2017-06-01 NOTE — Progress Notes (Signed)
[facility] Progress Note  Patient: Aaron Sellers [id number]:[id number]   PCP: Sheryle Hail, PA-C DOB: 1950-04-08   DOA: 05/28/2017   DOS: 06/01/2017   Date of Service: the patient was seen and examined on 06/01/2017  Subjective: Large BM.  No blood.  No nausea no vomiting no fever no chills.  Brief hospital course: Pt. with PMH of stage IV metastatic lung cancer, COPD, chronic kidney disease, a flutter, hypertension; admitted on 05/28/2017, presented with complaint of abdominal pain, was found to have bowel perforation with acute diverticulitis as well as SVT. Currently further plan is continue current treatment.  Assessment and Plan:   Perforated diverticulitis- She was started on IV Zosyn, now we will transition to oral Augmentin. Initially patient was n.p.o. now transitioning him to full liquid diet. Tolerating it well. Patient's abdomen exam is benign and patient not toxic appearing.   General surgery consulted. Appreciate input. Conservative management recommended.  Active Problems:   Atrial flutter with RVR ([place])- patient has failed carotid massage along with Valsalva and several doses of adenosine.   Patient was on IV Cardizem.  Later on Cardizem dose was increased and patient was also given oral Lopressor.  Despite all these measures patient remained sustained RVR.  Cardiology consulted and patient is currently started on amiodarone.  Monitor.  Appreciate input.  Echocardiogram suggests preserved EF without any wall motion abnormalities.    COPD (chronic obstructive pulmonary disease) (HCC)-stable at this time.  Continue neb treatments and supplemental oxygen as needed    Hypertension-stable    Chronic kidney disease-stable with normal creatinine at this time    RLQ abdominal pain-as above    Stage IV squamous cell carcinoma of right lung (HCC)-continue outpatient follow-up with Dr. Julien Nordmann once he gets over this current medical infectious issue  Diet: liquid diet DVT  Prophylaxis: subcutaneous Heparin  Advance goals of care discussion: full code  Family Communication: family was present at bedside, at the time of interview. The pt provided permission to discuss medical plan with the family. Opportunity was given to ask question and all questions were answered satisfactorily.   Disposition:  Discharge to home.  Consultants: General surgery oncology  Procedures: none  Antibiotics: Anti-infectives (From admission, onward)   Start     Dose/Rate Route Frequency Ordered Stop   05/31/17 1600  amoxicillin-clavulanate (AUGMENTIN) 500-125 MG per tablet 500 mg     1 tablet Oral 3 times daily 05/31/17 1117     05/28/17 2200  piperacillin-tazobactam (ZOSYN) IVPB 3.375 g  Status:  Discontinued     3.375 g 12.5 mL/hr over 240 Minutes Intravenous Every 8 hours 05/28/17 1715 05/31/17 1117   05/28/17 1615  piperacillin-tazobactam (ZOSYN) IVPB 3.375 g     3.375 g 100 mL/hr over 30 Minutes Intravenous  Once 05/28/17 1604 05/28/17 1649       Objective: Physical Exam: Vitals:   06/01/17 0500 06/01/17 0552 06/01/17 0921 06/01/17 1420  BP:  109/69 120/76 111/73  Pulse:  65 (!) 130 63  Resp:      Temp:    98.1 F (36.7 C)  TempSrc:    Oral  SpO2:    98%  Weight: 131.8 kg (290 lb 9.1 oz)     Height:        Intake/Output Summary (Last 24 hours) at 06/01/2017 1859 Last data filed at 06/01/2017 1800 Gross per 24 hour  Intake 343.2 ml  Output -  Net 343.2 ml   Filed Weights   05/30/17 0600 05/31/17 0433  06/01/17 0500  Weight: 129.8 kg (286 lb 2.5 oz) 130.8 kg (288 lb 6.4 oz) 131.8 kg (290 lb 9.1 oz)   General: Alert, Awake and Oriented to Time, Place and Person. Appear in mild distress, affect appropriate Eyes: PERRL, Conjunctiva normal ENT: Oral Mucosa clear moist. Neck: no JVD, no Abnormal Mass Or lumps Cardiovascular: S1 and S2 Present, no Murmur, Peripheral Pulses Present Respiratory: normal respiratory effort, Bilateral Air entry equal and Decreased,  no use of accessory muscle, Clear to Auscultation, no Crackles, no wheezes Abdomen: Bowel Sound present, Soft and no tenderness, no hernia Skin: no redness, no Rash, no induration Extremities: no Pedal edema, no calf tenderness Neurologic: Grossly no focal neuro deficit. Bilaterally Equal motor strength  Data Reviewed: CBC: Recent Labs  Lab 05/28/17 1106 05/29/17 0316 05/30/17 0255 05/31/17 0744 06/01/17 0454  WBC 8.2 9.7 15.1* 17.4* 17.4*  NEUTROABS 6.0  --  12.0  --   --   HGB 12.8* 11.9* 11.7* 12.1* 11.6*  HCT 38.1* 35.1* 36.1* 36.8* 35.6*  MCV 88.0 90.0 91.2 90.6 91.3  PLT 132* 141* 179 185 720   Basic Metabolic Panel: Recent Labs  Lab 05/28/17 1106 05/29/17 0316 05/30/17 0255 05/31/17 0744 06/01/17 0454  NA 137 137 139 139 142  K 4.1 3.5 3.6 3.9 3.8  CL 101 104 105 105 107  CO2 28 25 27 27 28   GLUCOSE 135 111* 91 104* 104*  BUN 14 15 17 10 6   CREATININE 0.80 0.69 0.81 0.72 0.69  CALCIUM 9.4 8.4* 8.6* 8.8* 8.5*  MG  --   --  1.9  --  1.6*    Liver Function Tests: Recent Labs  Lab 05/28/17 1106 05/30/17 0255 05/31/17 0744  AST 24 22 21   ALT 35 36 29  ALKPHOS 122 94 105  BILITOT 1.0 1.0 0.5  PROT 6.7 6.0* 6.0*  ALBUMIN 3.2* 3.2* 3.1*   No results for input(s): LIPASE, AMYLASE in the last 168 hours. No results for input(s): AMMONIA in the last 168 hours. Coagulation Profile: No results for input(s): INR, PROTIME in the last 168 hours. Cardiac Enzymes: No results for input(s): CKTOTAL, CKMB, CKMBINDEX, TROPONINI in the last 168 hours. BNP (last 3 results) No results for input(s): PROBNP in the last 8760 hours. CBG: No results for input(s): GLUCAP in the last 168 hours. Studies: No results found.  Scheduled Meds: . amoxicillin-clavulanate  1 tablet Oral TID  . atorvastatin  10 mg Oral QPM  . gabapentin  100 mg Oral TID  . tamsulosin  0.4 mg Oral Daily   Continuous Infusions: . sodium chloride Stopped (06/01/17 0921)  . amiodarone     PRN  Meds: acetaminophen, HYDROcodone-acetaminophen, lip balm, ondansetron **OR** ondansetron (ZOFRAN) IV  Time spent: 35 minutes  Author: Berle Mull, MD [facility] Pager: [phone] 06/01/2017 6:59 PM  If 7PM-7AM, please contact night-coverage at [email or web address], password [facility]

## 2017-06-01 NOTE — Progress Notes (Signed)
  Echocardiogram 2D Echocardiogram has been performed.  Aaron Sellers 06/01/2017, 10:39 AM

## 2017-06-01 NOTE — Consult Note (Signed)
Cardiology Consultation:   Patient ID: Aaron Sellers; [id number]; [birth year]   Admit date: 05/28/2017 Date of Consult: 06/01/2017  Primary Care Provider: Sheryle Hail, PA-C Primary Cardiologist: Dr Carlyle Dolly MD Primary Electrophysiologist:  n/a   Patient Profile:   Aaron Sellers is a 67 y.o. male with a hx of stage IV lung cancer, paroxysmal aflutter  who is being seen today for the evaluation of aflutter with RVR at the request of Dr Posey Pronto.  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 3 days of right lower quadrant pain, CT abdomen showed perofrated bowel. In this setting he had has exacerbation of his paroxysmal aflutter with elevated rates. Perforated bowel is being managed conservatively, cardiology is consulted to assist with arrhythmia. Patient reports occasional palpitatations. No significant SOB or chest pain.    Past Medical History:  Diagnosis Date  . Arthritis   . Atrial flutter ([place])   . BPH (benign prostatic hypertrophy) with urinary retention   . Cancer ([place])   . Chronic kidney disease    hx of kidney stones  2011  . COPD (chronic obstructive pulmonary disease) ([place])    has been smoking since he was 22 ish--  . High cholesterol   . Hypertension    dx around 2011  . Squamous cell carcinoma of right lung [facility])     Past Surgical History:  Procedure Laterality Date  . HERNIA REPAIR     umbilical hernia  09/8293  . INCISION AND DRAINAGE ABSCESS     "down the middle" of his buttocks  2015  . TOTAL KNEE ARTHROPLASTY Left 10/29/2014   Procedure: TOTAL KNEE ARTHROPLASTY;  Surgeon: Dorna Leitz, MD;  Location: [place];  Service: Orthopedics;  Laterality: Left;      Inpatient Medications: Scheduled Meds: . amoxicillin-clavulanate  1 tablet Oral TID  . atorvastatin  10 mg Oral QPM  . gabapentin  100 mg Oral TID  . metoprolol  tartrate  25 mg Oral BID  . tamsulosin  0.4 mg Oral Daily   Continuous Infusions: . sodium chloride Stopped (06/01/17 0921)  . diltiazem (CARDIZEM) infusion 5 mg/hr (06/01/17 0145)  . magnesium sulfate 1 - 4 g bolus IVPB 2 g (06/01/17 0923)  . sodium chloride     PRN Meds: acetaminophen, HYDROcodone-acetaminophen, lip balm, ondansetron **OR** ondansetron (ZOFRAN) IV  Allergies:    Allergies  Allergen Reactions  . Ciprofloxacin     HIVES  . Adhesive [Tape] Other (See Comments)    Skin peels  . Codeine Other (See Comments)    Jitters, "skin crawling"    Social History:   Social History   Socioeconomic History  . Marital status: Significant Other    Spouse name: Not on file  . Number of children: Not on file  . Years of education: Not on file  . Highest education level: Not on file  Social Needs  . Financial resource strain: Not on file  . Food insecurity - worry: Not on file  . Food insecurity - inability: Not on file  . Transportation needs - medical: Not on file  . Transportation needs - non-medical: Not on file  Occupational History  . Not on file  Tobacco Use  . Smoking status: Former Smoker    Packs/day: 1.00    Years: 40.00    Pack years: 40.00    Types: Cigarettes    Last  attempt to quit: 01/27/2017    Years since quitting: 0.3  . Smokeless tobacco: Former Systems developer    Types: Snuff  Substance and Sexual Activity  . Alcohol use: No  . Drug use: No  . Sexual activity: Not Currently  Other Topics Concern  . Not on file  Social History Narrative  . Not on file    Family History:    Family History  Problem Relation Age of Onset  . Hypertension Mother   . CVA Father   . Lung cancer Sister   . Lung cancer Brother   . Hypertension Brother   . Hypertension Sister      ROS:  Please see the history of present illness.  All other ROS reviewed and negative.     Physical Exam/Data:   Vitals:   06/01/17 0403 06/01/17 0500 06/01/17 0552 06/01/17 0921    BP: 111/69  109/69 120/76  Pulse: 65  65 (!) 130  Resp: 18     Temp: 98 F (36.7 C)     TempSrc: Oral     SpO2: 95%     Weight:  290 lb 9.1 oz (131.8 kg)    Height:        Intake/Output Summary (Last 24 hours) at 06/01/2017 1010 Last data filed at 06/01/2017 0900 Gross per 24 hour  Intake 660 ml  Output -  Net 660 ml   Filed Weights   05/30/17 0600 05/31/17 0433 06/01/17 0500  Weight: 286 lb 2.5 oz (129.8 kg) 288 lb 6.4 oz (130.8 kg) 290 lb 9.1 oz (131.8 kg)   Body mass index is 32.73 kg/m.  General:  Well nourished, well developed, in no acute distress HEENT: normal Lymph: no adenopathy Neck: no JVD Endocrine:  No thryomegaly Vascular: No carotid bruits; FA pulses 2+ bilaterally without bruits  Cardiac:  Regular, tachy 130s, no m/r/g Lungs:  clear to auscultation bilaterally, no wheezing, rhonchi or rales  Abd: soft, nontender, no hepatomegaly  Ext: no edema Musculoskeletal:  No deformities, BUE and BLE strength normal and equal Skin: warm and dry  Neuro:  CNs 2-12 intact, no focal abnormalities noted Psych:  Normal affect     Chemistry Recent Labs  Lab 05/30/17 0255 05/31/17 0744 06/01/17 0454  NA 139 139 142  K 3.6 3.9 3.8  CL 105 105 107  CO2 27 27 28   GLUCOSE 91 104* 104*  BUN 17 10 6   CREATININE 0.81 0.72 0.69  CALCIUM 8.6* 8.8* 8.5*  GFRNONAA >60 >60 >60  GFRAA >60 >60 >60  ANIONGAP 7 7 7     Recent Labs  Lab 05/28/17 1106 05/30/17 0255 05/31/17 0744  PROT 6.7 6.0* 6.0*  ALBUMIN 3.2* 3.2* 3.1*  AST 24 22 21   ALT 35 36 29  ALKPHOS 122 94 105  BILITOT 1.0 1.0 0.5   Hematology Recent Labs  Lab 05/30/17 0255 05/31/17 0744 06/01/17 0454  WBC 15.1* 17.4* 17.4*  RBC 3.96* 4.06* 3.90*  HGB 11.7* 12.1* 11.6*  HCT 36.1* 36.8* 35.6*  MCV 91.2 90.6 91.3  MCH 29.5 29.8 29.7  MCHC 32.4 32.9 32.6  RDW 16.1* 16.1* 16.2*  PLT 179 185 204   Cardiac EnzymesNo results for input(s): TROPONINI in the last 168 hours. No results for input(s):  TROPIPOC in the last 168 hours.  BNPNo results for input(s): BNP, PROBNP in the last 168 hours.  DDimer No results for input(s): DDIMER in the last 168 hours.  Radiology/Studies:  Ct Renal Stone Study  Result Date: 05/28/2017  CLINICAL DATA:  67 year old male with history of right-sided groin and back pain since 05/25/2017. Gross hematuria. EXAM: CT ABDOMEN AND PELVIS WITHOUT CONTRAST TECHNIQUE: Multidetector CT imaging of the abdomen and pelvis was performed following the standard protocol without IV contrast. COMPARISON:  CT the chest, abdomen and pelvis 05/20/2017. FINDINGS: Lower chest: Subtle areas of septal thickening throughout the visualize lung bases. Atherosclerotic calcifications in the left anterior descending and right coronary arteries. Hepatobiliary: Mild diffuse low attenuation throughout the hepatic parenchyma, compatible with a background of hepatic steatosis. Liver has a slightly shrunken appearance and nodular contour, indicative of underlying cirrhosis. No definite cystic or solid hepatic lesions are confidently identified on today's noncontrast CT examination. Unenhanced appearance of the gallbladder is normal. Pancreas: No definite pancreatic mass or peripancreatic fluid or inflammatory changes are noted on today's noncontrast CT examination. Spleen: Unremarkable. Adrenals/Urinary Tract: 2.8 x 2.1 cm low-attenuation (5 HU) right adrenal nodule, similar to the prior examination, compatible with a adenoma. Left adrenal gland is normal in appearance. In the lateral aspect of the upper pole the right kidney there is a 2.2 cm low-attenuation lesion which is incompletely characterized on today's noncontrast CT examination, but was compatible with a simple cyst on the recent prior study. Multiple nonobstructive calculi are noted within the collecting systems of both kidneys measuring 2-3 mm in size. In addition, in the distal third of the right ureter shortly before the right ureterovesicular  junction (axial image 91 of series 2 and coronal image 133 of series 6) there are 2 adjacent tiny calculi measuring 3 mm. No significant proximal right hydroureteronephrosis. The ureter around the distal calculi does appear to be thickened, presumably inflamed. No left ureteral stones. No bladder stones. Unenhanced appearance of the urinary bladder is unremarkable. Stomach/Bowel: Unenhanced appearance of the stomach is normal. No pathologic dilatation of small bowel or colon. Numerous colonic diverticulae are again noted, most evident in the sigmoid colon. In the right lower quadrant adjacent to both a loop of the sigmoid colon and adjacent to the appendix there is extensive inflammation with a Aaron amount of fluid (axial image 70 of series 3) without a well-defined abscess. These findings could reflect either an acute diverticulitis or an acute appendicitis. Notably, there is a small amount of pneumoperitoneum, indicative of perforation. Vascular/Lymphatic: Aortic atherosclerosis with fusiform ectasia of the infrarenal abdominal aorta which measures up to 2.8 cm in diameter. No lymphadenopathy noted in the abdomen or pelvis. Reproductive: Prostate gland and seminal vesicles are unremarkable in appearance. Other: No significant volume of ascites. Aaron volume of pneumoperitoneum. Musculoskeletal: There are no aggressive appearing lytic or blastic lesions noted in the visualized portions of the skeleton. IMPRESSION: 1. Inflammatory process in the right lower quadrant of the abdomen adjacent to the appendix and to several diverticulae in the mid sigmoid colon. It is uncertain whether or not this represents an acute diverticulitis or an acute appendicitis. Regardless, there are extensive surrounding inflammatory changes, there is a Aaron volume of adjacent fluid which likely reflects a developing phlegmon, and there is clear evidence of perforation demonstrated by small volume of pneumoperitoneum. Emergent surgical  consultation is strongly recommended. 2. Two tiny 3 mm calculi in the distal third of the right ureter shortly before the right ureterovesicular junction. No proximal hydroureteronephrosis at this time. Additional tiny 2-3 mm nonobstructive calculi are noted within the collecting systems of both kidneys. 3. Aortic atherosclerosis. 4. Subtle changes in the lung bases bilaterally which could be indicative of early or mild interstitial lung  disease. Follow-up nonemergent high-resolution chest CT is suggested in the next several months to better evaluate these findings. 5. Hepatic steatosis with evidence of underlying hepatic cirrhosis, as above. 6. Stable right adrenal adenoma. Aortic Atherosclerosis (ICD10-I70.0). Critical Value/emergent results were called by telephone at the time of interpretation on 05/28/2017 at 2:34 pm to [place] , who verbally acknowledged these results. Electronically Signed   By: Vinnie Langton M.D.   On: 05/28/2017 14:34    Assessment and Plan:   1. Paroxymal aflutter - exacerbated in the setting of perforated bowel. He had similar troubles with his heart rates during a prior admission with diverticulitis in 12/2016 when I also saw him.  - previously on dilt gtt, transitioned to oral. Oral dilt titrate up to 360, also lopressor 25mg  bid started. Restarted on dilt gtt early this AM, dosing limited due to soft bp's. - he had not been on anticoag. When I saw him in 12/2016 he had a partially perforated bowel and newly diagnosed lung mass combined with low CHADS2Vasc score of 1, and thus anticoagwas not started at that time. - with current bowel perforation and low CHADS2Vasc score would not start anticoag at this time.   - failing attempts at rate control, will start IV amiodarone. Once acute medical issues resolved may be able to discontinue and go back to his outpatient rate control regimen. D/c dilt gtt and oral lopressor at this time.   2. Diverticulitis/Ruptured  bowel - management per primary team and surgery  3. Stage IV lung CA - followed by oncology, on chemo currently. From review of onc note 02/27/17 appears treatment is palliative.    We will follow up telemetry tomorrow.   For questions or updates, please contact [place] Please consult [email or web address] for contact info under Cardiology/STEMI.   Merrily Pew, MD  06/01/2017 10:10 AM

## 2017-06-01 NOTE — Progress Notes (Signed)
Pt on Cardizem drip, pts HR still in 140s after 1 hour, BP 95/72. Notified on call MD Bodenheimer, given order to give bolus and recheck BP to see if Cardizem can be increased.  Rosine Beat, RN

## 2017-06-02 DIAGNOSIS — K5780 Diverticulitis of intestine, part unspecified, with perforation and abscess without bleeding: Principal | ICD-10-CM

## 2017-06-02 DIAGNOSIS — K5732 Diverticulitis of large intestine without perforation or abscess without bleeding: Secondary | ICD-10-CM

## 2017-06-02 LAB — BASIC METABOLIC PANEL
ANION GAP: 8 (ref 5–15)
BUN: 5 mg/dL — AB (ref 6–20)
CALCIUM: 8.7 mg/dL — AB (ref 8.9–10.3)
CO2: 27 mmol/L (ref 22–32)
CREATININE: 0.78 mg/dL (ref 0.61–1.24)
Chloride: 102 mmol/L (ref 101–111)
GFR calc Af Amer: >60 mL/min (ref >60)
GLUCOSE: 103 mg/dL — AB (ref 65–99)
Potassium: 3.5 mmol/L (ref 3.5–5.1)
Sodium: 137 mmol/L (ref 135–145)

## 2017-06-02 LAB — CBC
HCT: 34.8 % — ABNORMAL LOW (ref 39.0–52.0)
HEMOGLOBIN: 11.5 g/dL — AB (ref 13.0–17.0)
MCH: 29.3 pg (ref 26.0–34.0)
MCHC: 33 g/dL (ref 30.0–36.0)
MCV: 88.8 fL (ref 78.0–100.0)
Platelets: 189 10*3/uL (ref 150–400)
RBC: 3.92 MIL/uL — ABNORMAL LOW (ref 4.22–5.81)
RDW: 15.8 % — AB (ref 11.5–15.5)
WBC: 12.2 10*3/uL — ABNORMAL HIGH (ref 4.0–10.5)

## 2017-06-02 MED ORDER — RISAQUAD PO CAPS
1.0000 | ORAL_CAPSULE | Freq: Every day | ORAL | Status: DC
  Administered 2017-06-02 – 2017-06-03 (×2): 1 via ORAL
  Filled 2017-06-02: qty 1

## 2017-06-02 NOTE — Progress Notes (Signed)
[facility] Progress Note  Patient: Aaron Sellers FIE:[id number]   PCP: Sheryle Hail, PA-C DOB: [birth year]   DOA: 05/28/2017   DOS: 06/02/2017   Date of Service: the patient was seen and examined on 06/02/2017  Subjective: Has diarrhea 4 episodes yesterday.  Watery.  No blood.  No abdominal pain.  No fever no chills.  Tolerating oral diet.  Brief hospital course: Pt. with PMH of stage IV metastatic lung cancer, COPD, chronic kidney disease, a flutter, hypertension; admitted on 05/28/2017, presented with complaint of abdominal pain, was found to have bowel perforation with acute diverticulitis as well as SVT. Currently further plan is continue current treatment.  Assessment and Plan:   Perforated diverticulitis- She was started on IV Zosyn, now we will transition to oral Augmentin. Initially n.p.o., tolerating liquid diet, will advance to soft diet.  WBC getting down Patient's abdomen exam is benign and patient not toxic appearing.   General surgery consulted. Appreciate input. Conservative management recommended.  Active Problems:   Atrial flutter with RVR ([place])- patient has failed carotid massage along with Valsalva and several doses of adenosine.   Patient was on IV Cardizem.  Later on Cardizem dose was increased and patient was also given oral Lopressor.  Despite all these measures patient remained sustained RVR.  Cardiology consulted and patient is currently started on amiodarone.  Monitor.  Appreciate input.  Echocardiogram suggests preserved EF without any wall motion abnormalities.    COPD (chronic obstructive pulmonary disease) (HCC)-stable at this time.  Continue neb treatments and supplemental oxygen as needed    Hypertension-stable    Chronic kidney disease-stable with normal creatinine at this time    RLQ abdominal pain-as above    Stage IV squamous cell carcinoma of right lung (HCC)-continue outpatient follow-up with Dr. Julien Nordmann once he gets over this current  medical infectious issue  Diarrhea. From amoxicillin.  No evidence of C. difficile. Monitor. Add probiotics and advance diet.  Diet: Soft diet DVT Prophylaxis: subcutaneous Heparin  Advance goals of care discussion: full code  Family Communication: family was present at bedside, at the time of interview. The pt provided permission to discuss medical plan with the family. Opportunity was given to ask question and all questions were answered satisfactorily.   Disposition:  Discharge to home.  Consultants: General surgery oncology  Procedures: none  Antibiotics: Anti-infectives (From admission, onward)   Start     Dose/Rate Route Frequency Ordered Stop   05/31/17 1600  amoxicillin-clavulanate (AUGMENTIN) 500-125 MG per tablet 500 mg     1 tablet Oral 3 times daily 05/31/17 1117     05/28/17 2200  piperacillin-tazobactam (ZOSYN) IVPB 3.375 g  Status:  Discontinued     3.375 g 12.5 mL/hr over 240 Minutes Intravenous Every 8 hours 05/28/17 1715 05/31/17 1117   05/28/17 1615  piperacillin-tazobactam (ZOSYN) IVPB 3.375 g     3.375 g 100 mL/hr over 30 Minutes Intravenous  Once 05/28/17 1604 05/28/17 1649       Objective: Physical Exam: Vitals:   06/01/17 2133 06/02/17 0506 06/02/17 0517 06/02/17 1329  BP: 136/83 118/78  124/68  Pulse: 61 64  65  Resp: 18 18  18   Temp: (!) 97.4 F (36.3 C) 97.6 F (36.4 C)    TempSrc: Oral Oral    SpO2: 96% 98%  97%  Weight:   131.5 kg (290 lb)   Height:        Intake/Output Summary (Last 24 hours) at 06/02/2017 1703 Last data filed at 06/02/2017  1600 Gross per 24 hour  Intake 1325.4 ml  Output -  Net 1325.4 ml   Filed Weights   05/31/17 0433 06/01/17 0500 06/02/17 0517  Weight: 130.8 kg (288 lb 6.4 oz) 131.8 kg (290 lb 9.1 oz) 131.5 kg (290 lb)   General: Alert, Awake and Oriented to Time, Place and Person. Appear in mild distress, affect appropriate Eyes: PERRL, Conjunctiva normal ENT: Oral Mucosa clear moist. Neck: no JVD, no  Abnormal Mass Or lumps Cardiovascular: S1 and S2 Present, no Murmur, Peripheral Pulses Present Respiratory: normal respiratory effort, Bilateral Air entry equal and Decreased, no use of accessory muscle, Clear to Auscultation, no Crackles, no wheezes Abdomen: Bowel Sound present, Soft and no tenderness, no hernia Skin: no redness, no Rash, no induration Extremities: no Pedal edema, no calf tenderness Neurologic: Grossly no focal neuro deficit. Bilaterally Equal motor strength  Data Reviewed: CBC: Recent Labs  Lab 05/28/17 1106 05/29/17 0316 05/30/17 0255 05/31/17 0744 06/01/17 0454 06/02/17 0541  WBC 8.2 9.7 15.1* 17.4* 17.4* 12.2*  NEUTROABS 6.0  --  12.0  --   --   --   HGB 12.8* 11.9* 11.7* 12.1* 11.6* 11.5*  HCT 38.1* 35.1* 36.1* 36.8* 35.6* 34.8*  MCV 88.0 90.0 91.2 90.6 91.3 88.8  PLT 132* 141* 179 185 204 016   Basic Metabolic Panel: Recent Labs  Lab 05/29/17 0316 05/30/17 0255 05/31/17 0744 06/01/17 0454 06/02/17 0541  NA 137 139 139 142 137  K 3.5 3.6 3.9 3.8 3.5  CL 104 105 105 107 102  CO2 25 27 27 28 27   GLUCOSE 111* 91 104* 104* 103*  BUN 15 17 10 6  5*  CREATININE 0.69 0.81 0.72 0.69 0.78  CALCIUM 8.4* 8.6* 8.8* 8.5* 8.7*  MG  --  1.9  --  1.6*  --     Liver Function Tests: Recent Labs  Lab 05/28/17 1106 05/30/17 0255 05/31/17 0744  AST 24 22 21   ALT 35 36 29  ALKPHOS 122 94 105  BILITOT 1.0 1.0 0.5  PROT 6.7 6.0* 6.0*  ALBUMIN 3.2* 3.2* 3.1*   No results for input(s): LIPASE, AMYLASE in the last 168 hours. No results for input(s): AMMONIA in the last 168 hours. Coagulation Profile: No results for input(s): INR, PROTIME in the last 168 hours. Cardiac Enzymes: No results for input(s): CKTOTAL, CKMB, CKMBINDEX, TROPONINI in the last 168 hours. BNP (last 3 results) No results for input(s): PROBNP in the last 8760 hours. CBG: No results for input(s): GLUCAP in the last 168 hours. Studies: No results found.  Scheduled Meds: . acidophilus   1 capsule Oral Daily  . amoxicillin-clavulanate  1 tablet Oral TID  . atorvastatin  10 mg Oral QPM  . gabapentin  100 mg Oral TID  . tamsulosin  0.4 mg Oral Daily   Continuous Infusions: . sodium chloride 50 mL/hr at 06/02/17 1119  . amiodarone 30 mg/hr (06/02/17 0922)   PRN Meds: acetaminophen, HYDROcodone-acetaminophen, lip balm, ondansetron **OR** ondansetron (ZOFRAN) IV  Time spent: 35 minutes  Author: Berle Mull, MD [facility] Pager: [phone] 06/02/2017 5:03 PM  If 7PM-7AM, please contact night-coverage at [email or web address], password [facility]

## 2017-06-02 NOTE — Progress Notes (Addendum)
Telemetry reviewed this AM. Patient started on IV amiodarone yesterday due to aflutter refractory to rate control agents. History of paroxysmal aflutter, exacerbated due to current GI issues with bowel perforation. Converted to NSR yesterday on amio gtt. Continue drip today, likely convert to oral tomorrow. May be able to come off amio over the next few weeks, he was reasonably controlled on av nodal agents alone, recent exacerbation in setting of acute GI issues and bowel perforation. Patient had echo yesterday with normal LVEF   Carlyle Dolly MD

## 2017-06-02 NOTE — Progress Notes (Signed)
RN called to room by pt to look at IV site. LAC IV site infiltrated with a red streak. IV team consult place to assess. Heat pack applied

## 2017-06-02 NOTE — Evaluation (Signed)
Physical Therapy Evaluation Patient Details Name: Aaron Sellers MRN: [id number] DOB: [birth year] Today's Date: 06/02/2017   History of Present Illness  Pt. with PMH of stage IV metastatic lung cancer, COPD, chronic kidney disease, a flutter, hypertension; admitted on 05/28/2017, presented with complaint of abdominal pain, was found to have bowel perforation with acute diverticulitis as well as SVT.  Clinical Impression  Pt admitted with above diagnosis. Pt currently with functional limitations due to the deficits listed below (see PT Problem List). Pt will benefit from skilled PT to increase their independence and safety with mobility to allow discharge to the venue listed below.  Pt close to baseline, but some fatigue with gait and reliant on the IV pole. Will follow acutely, but no services needed at time of d/c.     Follow Up Recommendations No PT follow up    Equipment Recommendations  None recommended by PT    Recommendations for Other Services       Precautions / Restrictions Precautions Precautions: None Restrictions Weight Bearing Restrictions: No      Mobility  Bed Mobility Overal bed mobility: Independent                Transfers Overall transfer level: Modified independent                  Ambulation/Gait Ambulation/Gait assistance: Supervision Ambulation Distance (Feet): 450 Feet Assistive device: (IV pole) Gait Pattern/deviations: Step-through pattern Gait velocity: WNL   General Gait Details: Pt reports some feeling of weakness in legs. HR 70 after gait.  Stairs            Wheelchair Mobility    Modified Rankin (Stroke Patients Only)       Balance Overall balance assessment: No apparent balance deficits (not formally assessed)                                           Pertinent Vitals/Pain Pain Assessment: No/denies pain    Home Living Family/patient expects to be discharged to:: Private residence Living  Arrangements: Spouse/significant other Available Help at Discharge: Family;Available 24 hours/day Type of Home: House Home Access: Level entry     Home Layout: Multi-level Home Equipment: Walker - 2 wheels;Cane - single point;Shower seat;Bedside commode      Prior Function Level of Independence: Independent               Hand Dominance   Dominant Hand: Right    Extremity/Trunk Assessment   Upper Extremity Assessment Upper Extremity Assessment: Overall WFL for tasks assessed    Lower Extremity Assessment Lower Extremity Assessment: Overall WFL for tasks assessed       Communication   Communication: HOH  Cognition Arousal/Alertness: Awake/alert Behavior During Therapy: WFL for tasks assessed/performed Overall Cognitive Status: Within Functional Limits for tasks assessed                                        General Comments      Exercises     Assessment/Plan    PT Assessment Patient needs continued PT services  PT Problem List Decreased activity tolerance;Decreased mobility       PT Treatment Interventions Gait training;Stair training;Functional mobility training;Balance training;Therapeutic exercise;Therapeutic activities;Patient/family education    PT Goals (Current goals can be found in the  Care Plan section)  Acute Rehab PT Goals Patient Stated Goal: home PT Goal Formulation: With patient/family Time For Goal Achievement: 06/16/17 Potential to Achieve Goals: Good    Frequency Min 2X/week   Barriers to discharge        Co-evaluation               AM-PAC PT "6 Clicks" Daily Activity  Outcome Measure Difficulty turning over in bed (including adjusting bedclothes, sheets and blankets)?: None Difficulty moving from lying on back to sitting on the side of the bed? : None Difficulty sitting down on and standing up from a chair with arms (e.g., wheelchair, bedside commode, etc,.)?: None Help needed moving to and from a bed  to chair (including a wheelchair)?: A Little Help needed walking in hospital room?: A Little Help needed climbing 3-5 steps with a railing? : A Little 6 Click Score: 21    End of Session Equipment Utilized During Treatment: Gait belt Activity Tolerance: Patient tolerated treatment well Patient left: in chair;with call bell/phone within reach;with family/visitor present Nurse Communication: Mobility status PT Visit Diagnosis: Difficulty in walking, not elsewhere classified (R26.2)    Time: 9211-9417 PT Time Calculation (min) (ACUTE ONLY): 20 min   Charges:   PT Evaluation $PT Eval Low Complexity: 1 Low     PT G Codes:        Aaron Sellers, [place] Pager [phone] 06/02/2017   Galen Manila 06/02/2017, 12:48 PM

## 2017-06-03 ENCOUNTER — Telehealth: Payer: Self-pay | Admitting: *Deleted

## 2017-06-03 DIAGNOSIS — I4891 Unspecified atrial fibrillation: Secondary | ICD-10-CM

## 2017-06-03 DIAGNOSIS — K5732 Diverticulitis of large intestine without perforation or abscess without bleeding: Secondary | ICD-10-CM

## 2017-06-03 LAB — BASIC METABOLIC PANEL
Anion gap: 9 (ref 5–15)
BUN: 6 mg/dL (ref 6–20)
CALCIUM: 9.2 mg/dL (ref 8.9–10.3)
CHLORIDE: 105 mmol/L (ref 101–111)
CO2: 27 mmol/L (ref 22–32)
CREATININE: 0.76 mg/dL (ref 0.61–1.24)
GFR calc non Af Amer: >60 mL/min (ref >60)
Glucose, Bld: 103 mg/dL — ABNORMAL HIGH (ref 65–99)
Potassium: 3.8 mmol/L (ref 3.5–5.1)
SODIUM: 141 mmol/L (ref 135–145)

## 2017-06-03 LAB — CBC
HCT: 37.4 % — ABNORMAL LOW (ref 39.0–52.0)
Hemoglobin: 12.4 g/dL — ABNORMAL LOW (ref 13.0–17.0)
MCH: 30 pg (ref 26.0–34.0)
MCHC: 33.2 g/dL (ref 30.0–36.0)
MCV: 90.6 fL (ref 78.0–100.0)
PLATELETS: 211 10*3/uL (ref 150–400)
RBC: 4.13 MIL/uL — ABNORMAL LOW (ref 4.22–5.81)
RDW: 15.7 % — AB (ref 11.5–15.5)
WBC: 12.3 10*3/uL — ABNORMAL HIGH (ref 4.0–10.5)

## 2017-06-03 MED ORDER — AMIODARONE HCL 200 MG PO TABS: 200.0000 mg | ORAL_TABLET | Freq: Every day | ORAL | Status: DC

## 2017-06-03 MED ORDER — AMIODARONE HCL 200 MG PO TABS: 200.0000 mg | ORAL_TABLET | Freq: Every day | ORAL | 0 refills | Status: DC

## 2017-06-03 MED ORDER — HYDROCODONE-ACETAMINOPHEN 5-325 MG PO TABS: 1.0000 | ORAL_TABLET | Freq: Four times a day (QID) | ORAL | 0 refills | Status: DC | PRN

## 2017-06-03 MED ORDER — AMIODARONE HCL 200 MG PO TABS
200.0000 mg | ORAL_TABLET | Freq: Two times a day (BID) | ORAL | Status: DC
  Administered 2017-06-03: 200 mg via ORAL
  Filled 2017-06-03: qty 1

## 2017-06-03 MED ORDER — AMIODARONE HCL 200 MG PO TABS: 200.0000 mg | ORAL_TABLET | Freq: Two times a day (BID) | ORAL | 0 refills | Status: DC

## 2017-06-03 MED ORDER — RISAQUAD PO CAPS: 1.0000 | ORAL_CAPSULE | Freq: Every day | ORAL | 0 refills | Status: DC

## 2017-06-03 MED ORDER — AMOXICILLIN-POT CLAVULANATE 500-125 MG PO TABS: 1.0000 | ORAL_TABLET | Freq: Three times a day (TID) | ORAL | 0 refills | Status: AC

## 2017-06-03 NOTE — Discharge Instructions (Signed)
Diverticulitis °Diverticulitis is infection or inflammation of small pouches (diverticula) in the colon that form due to a condition called diverticulosis. Diverticula can trap stool (feces) and bacteria, causing infection and inflammation. °Diverticulitis may cause severe stomach pain and diarrhea. It may lead to tissue damage in the colon that causes bleeding. The diverticula may also burst (rupture) and cause infected stool to enter other areas of the abdomen. °Complications of diverticulitis can include: °· Bleeding. °· Severe infection. °· Severe pain. °· Rupture (perforation) of the colon. °· Blockage (obstruction) of the colon. ° °What are the causes? °This condition is caused by stool becoming trapped in the diverticula, which allows bacteria to grow in the diverticula. This leads to inflammation and infection. °What increases the risk? °You are more likely to develop this condition if: °· You have diverticulosis. The risk for diverticulosis increases if: °? You are overweight or obese. °? You use tobacco products. °? You do not get enough exercise. °· You eat a diet that does not include enough fiber. High-fiber foods include fruits, vegetables, beans, nuts, and whole grains. ° °What are the signs or symptoms? °Symptoms of this condition may include: °· Pain and tenderness in the abdomen. The pain is normally located on the left side of the abdomen, but it may occur in other areas. °· Fever and chills. °· Bloating. °· Cramping. °· Nausea. °· Vomiting. °· Changes in bowel routines. °· Blood in your stool. ° °How is this diagnosed? °This condition is diagnosed based on: °· Your medical history. °· A physical exam. °· Tests to make sure there is nothing else causing your condition. These tests may include: °? Blood tests. °? Urine tests. °? Imaging tests of the abdomen, including X-rays, ultrasounds, MRIs, or CT scans. ° °How is this treated? °Most cases of this condition are mild and can be treated at home.  Treatment may include: °· Taking over-the-counter pain medicines. °· Following a clear liquid diet. °· Taking antibiotic medicines by mouth. °· Rest. ° °More severe cases may need to be treated at a hospital. Treatment may include: °· Not eating or drinking. °· Taking prescription pain medicine. °· Receiving antibiotic medicines through an IV tube. °· Receiving fluids and nutrition through an IV tube. °· Surgery. ° °When your condition is under control, your health care provider may recommend that you have a colonoscopy. This is an exam to look at the entire large intestine. During the exam, a lubricated, bendable tube is inserted into the anus and then passed into the rectum, colon, and other parts of the large intestine. A colonoscopy can show how severe your diverticula are and whether something else may be causing your symptoms. °Follow these instructions at home: °Medicines °· Take over-the-counter and prescription medicines only as told by your health care provider. These include fiber supplements, probiotics, and stool softeners. °· If you were prescribed an antibiotic medicine, take it as told by your health care provider. Do not stop taking the antibiotic even if you start to feel better. °· Do not drive or use heavy machinery while taking prescription pain medicine. °General instructions °· Follow a full liquid diet or another diet as directed by your health care provider. After your symptoms improve, your health care provider may tell you to change your diet. He or she may recommend that you eat a diet that contains at least 25 g (25 grams) of fiber daily. Fiber makes it easier to pass stool. Healthy sources of fiber include: °? Berries. One cup   contains 4-8 grams of fiber. °? Beans or lentils. One half cup contains 5-8 grams of fiber. °? Green vegetables. One cup contains 4 grams of fiber. °· Exercise for at least 30 minutes, 3 times each week. You should exercise hard enough to raise your heart rate and  break a sweat. °· Keep all follow-up visits as told by your health care provider. This is important. You may need a colonoscopy. °Contact a health care provider if: °· Your pain does not improve. °· You have a hard time drinking or eating food. °· Your bowel movements do not return to normal. °Get help right away if: °· Your pain gets worse. °· Your symptoms do not get better with treatment. °· Your symptoms suddenly get worse. °· You have a fever. °· You vomit more than one time. °· You have stools that are bloody, black, or tarry. °Summary °· Diverticulitis is infection or inflammation of small pouches (diverticula) in the colon that form due to a condition called diverticulosis. Diverticula can trap stool (feces) and bacteria, causing infection and inflammation. °· You are at higher risk for this condition if you have diverticulosis and you eat a diet that does not include enough fiber. °· Most cases of this condition are mild and can be treated at home. More severe cases may need to be treated at a hospital. °· When your condition is under control, your health care provider may recommend that you have an exam called a colonoscopy. This exam can show how severe your diverticula are and whether something else may be causing your symptoms. °This information is not intended to replace advice given to you by your health care provider. Make sure you discuss any questions you have with your health care provider. °Document Released: 12/27/2004 Document Revised: 04/21/2016 Document Reviewed: 04/21/2016 °Elsevier Interactive Patient Education © 2018 [employer] ° °

## 2017-06-03 NOTE — Progress Notes (Signed)
[facility] Progress Note     Subjective: CC-  Wife at bedside. Patient reports no complaints today. States that his abdominal pain has resolved. Denies n/v. Tolerating soft diet. He had 2 loose/soft BMs yesterday. WBC 12.3, VSS. Hoping to go home today.  Objective: Vital signs in last 24 hours: Temp:  [98.3 F (36.8 C)-99.3 F (37.4 C)] 99.3 F (37.4 C) (03/04 0501) Pulse Rate:  [62-71] 71 (03/04 0501) Resp:  [16-18] 16 (03/04 0501) BP: (123-135)/(68-87) 123/77 (03/04 0501) SpO2:  [94 %-98 %] 94 % (03/04 0501) Weight:  [288 lb 4.8 oz (130.8 kg)] 288 lb 4.8 oz (130.8 kg) (03/04 0500) Last BM Date: 06/02/17  Intake/Output from previous day: 03/03 0701 - 03/04 0700 In: 2080.8 [P.O.:480; I.V.:1600.8] Out: -  Intake/Output this shift: Total I/O In: 120 [P.O.:120] Out: -   PE: Gen:  Alert, NAD, pleasant HEENT: EOM's intact, pupils equal and round Pulm:  effort normal Abd: Soft, +BS, no HSM, no hernia, nontender to light and deep palpation Ext:  Calves soft and nontender Psych: A&Ox3  Skin: no rashes noted, warm and dry  Lab Results:  Recent Labs    06/02/17 0541 06/03/17 0557  WBC 12.2* 12.3*  HGB 11.5* 12.4*  HCT 34.8* 37.4*  PLT 189 211   BMET Recent Labs    06/02/17 0541 06/03/17 0557  NA 137 141  K 3.5 3.8  CL 102 105  CO2 27 27  GLUCOSE 103* 103*  BUN 5* 6  CREATININE 0.78 0.76  CALCIUM 8.7* 9.2   PT/INR No results for input(s): LABPROT, INR in the last 72 hours. CMP     Component Value Date/Time   NA 141 06/03/2017 0557   NA 137 04/03/2017 1030   K 3.8 06/03/2017 0557   K 4.1 04/03/2017 1030   CL 105 06/03/2017 0557   CO2 27 06/03/2017 0557   CO2 27 04/03/2017 1030   GLUCOSE 103 (H) 06/03/2017 0557   GLUCOSE 93 04/03/2017 1030   BUN 6 06/03/2017 0557   BUN 15.5 04/03/2017 1030   CREATININE 0.76 06/03/2017 0557   CREATININE 0.8 04/03/2017 1030   CALCIUM 9.2 06/03/2017 0557   CALCIUM 9.4 04/03/2017 1030   PROT 6.0 (L)  05/31/2017 0744   PROT 7.0 04/03/2017 1030   ALBUMIN 3.1 (L) 05/31/2017 0744   ALBUMIN 3.6 04/03/2017 1030   AST 21 05/31/2017 0744   AST 14 04/03/2017 1030   ALT 29 05/31/2017 0744   ALT 21 04/03/2017 1030   ALKPHOS 105 05/31/2017 0744   ALKPHOS 97 04/03/2017 1030   BILITOT 0.5 05/31/2017 0744   BILITOT 0.67 04/03/2017 1030   GFRNONAA >60 06/03/2017 0557   GFRAA >60 06/03/2017 0557   Lipase     Component Value Date/Time   LIPASE 18 01/26/2017 1512       Studies/Results: No results found.  Anti-infectives: Anti-infectives (From admission, onward)   Start     Dose/Rate Route Frequency Ordered Stop   05/31/17 1600  amoxicillin-clavulanate (AUGMENTIN) 500-125 MG per tablet 500 mg     1 tablet Oral 3 times daily 05/31/17 1117     05/28/17 2200  piperacillin-tazobactam (ZOSYN) IVPB 3.375 g  Status:  Discontinued     3.375 g 12.5 mL/hr over 240 Minutes Intravenous Every 8 hours 05/28/17 1715 05/31/17 1117   05/28/17 1615  piperacillin-tazobactam (ZOSYN) IVPB 3.375 g     3.375 g 100 mL/hr over 30 Minutes Intravenous  Once 05/28/17 1604 05/28/17 1649  Assessment/Plan Stage IV lung cancer with metastasis/chemotherapy last Rx 05/21/17              - Dr. Lorna Few Right adrenal nodule Lateral renal calculi/urinary retention COPD/heavy tobacco use, quit 12/2016 History of atrial flutter -not on anticoagulants Hypertension BPH Hyperlipidemia Obesity  Acute diverticulitis versus appendicitis with microperforation Acute diverticulitis 12/2016  FEN: soft diet ID: Zosyn 2/26>>3/1, augmentin 3/1>>day#4 DVT: SCDs Foley: None Follow-up: Dr. Lucia Gaskins PRN   Plan: Patient tolerating soft diet, pain resolved. Ready for discharge from surgical standpoint. Continue PO antibiotics. Recommend colonoscopy in 6-8 weeks. General surgery will sign off, please call with concerns.   LOS: 6 days    Aaron Sellers , [facility] 06/03/2017, 9:10 AM Pager:  [phone] Consults: [phone] Mon-Fri 7:00 am-4:30 pm Sat-Sun 7:00 am-11:30 am

## 2017-06-03 NOTE — Progress Notes (Signed)
Progress Note  Patient Name: Aaron Sellers Date of Encounter: 06/03/2017  Primary Cardiologist: No primary care provider on file.   Subjective   Feeling well.  No chest pain, shortness of breath or palpitations.   Inpatient Medications    Scheduled Meds: . acidophilus  1 capsule Oral Daily  . amoxicillin-clavulanate  1 tablet Oral TID  . atorvastatin  10 mg Oral QPM  . gabapentin  100 mg Oral TID  . tamsulosin  0.4 mg Oral Daily   Continuous Infusions: . sodium chloride 50 mL/hr at 06/03/17 0501  . amiodarone 30 mg/hr (06/02/17 2141)   PRN Meds: acetaminophen, HYDROcodone-acetaminophen, lip balm, ondansetron **OR** ondansetron (ZOFRAN) IV   Vital Signs    Vitals:   06/02/17 1329 06/02/17 2140 06/03/17 0500 06/03/17 0501  BP: 124/68 135/87  123/77  Pulse: 65 62  71  Resp: 18 16  16   Temp:  98.3 F (36.8 C)  99.3 F (37.4 C)  TempSrc:  Oral  Oral  SpO2: 97% 98%  94%  Weight:   288 lb 4.8 oz (130.8 kg)   Height:        Intake/Output Summary (Last 24 hours) at 06/03/2017 1049 Last data filed at 06/03/2017 0809 Gross per 24 hour  Intake 2134 ml  Output -  Net 2134 ml   Filed Weights   06/01/17 0500 06/02/17 0517 06/03/17 0500  Weight: 290 lb 9.1 oz (131.8 kg) 290 lb (131.5 kg) 288 lb 4.8 oz (130.8 kg)    Telemetry    Sinus rhythm.  PVCs.  - Personally Reviewed  ECG    n/a - Personally Reviewed  Physical Exam   VS:  BP 123/77 (BP Location: Left Arm)   Pulse 71   Temp 99.3 F (37.4 C) (Oral)   Resp 16   Ht 6\' 7"  (2.007 m)   Wt 288 lb 4.8 oz (130.8 kg)   SpO2 94%   BMI 32.48 kg/m  , BMI Body mass index is 32.48 kg/m. GENERAL:  Well appearing HEENT: Pupils equal round and reactive, fundi not visualized, oral mucosa unremarkable NECK:  No jugular venous distention, waveform within normal limits, carotid upstroke brisk and symmetric, no bruits LUNGS:  Clear to auscultation bilaterally HEART:  RRR.  PMI not displaced or sustained,S1 and S2 within  normal limits, no S3, no S4, no clicks, no rubs, no murmurs ABD:  Flat, positive bowel sounds normal in frequency in pitch, no bruits, no rebound, no guarding, no midline pulsatile mass, no hepatomegaly, no splenomegaly EXT:  2 plus pulses throughout, no edema, no cyanosis no clubbing SKIN:  No rashes no nodules NEURO:  Cranial nerves II through XII grossly intact, motor grossly intact throughout [facility]:  Cognitively intact, oriented to person place and time   Labs    Chemistry Recent Labs  Lab 05/28/17 1106  05/30/17 0255 05/31/17 0744 06/01/17 0454 06/02/17 0541 06/03/17 0557  NA 137   < > 139 139 142 137 141  K 4.1   < > 3.6 3.9 3.8 3.5 3.8  CL 101   < > 105 105 107 102 105  CO2 28   < > 27 27 28 27 27   GLUCOSE 135   < > 91 104* 104* 103* 103*  BUN 14   < > 17 10 6  5* 6  CREATININE 0.80   < > 0.81 0.72 0.69 0.78 0.76  CALCIUM 9.4   < > 8.6* 8.8* 8.5* 8.7* 9.2  PROT 6.7  --  6.0*  6.0*  --   --   --   ALBUMIN 3.2*  --  3.2* 3.1*  --   --   --   AST 24  --  22 21  --   --   --   ALT 35  --  36 29  --   --   --   ALKPHOS 122  --  94 105  --   --   --   BILITOT 1.0  --  1.0 0.5  --   --   --   GFRNONAA >60   < > >60 >60 >60 >60 >60  GFRAA >60   < > >60 >60 >60 >60 >60  ANIONGAP 8   < > 7 7 7 8 9    < > = values in this interval not displayed.     Hematology Recent Labs  Lab 06/01/17 0454 06/02/17 0541 06/03/17 0557  WBC 17.4* 12.2* 12.3*  RBC 3.90* 3.92* 4.13*  HGB 11.6* 11.5* 12.4*  HCT 35.6* 34.8* 37.4*  MCV 91.3 88.8 90.6  MCH 29.7 29.3 30.0  MCHC 32.6 33.0 33.2  RDW 16.2* 15.8* 15.7*  PLT 204 189 211    Cardiac EnzymesNo results for input(s): TROPONINI in the last 168 hours. No results for input(s): TROPIPOC in the last 168 hours.   BNPNo results for input(s): BNP, PROBNP in the last 168 hours.   DDimer No results for input(s): DDIMER in the last 168 hours.   Radiology    No results found.  Cardiac Studies   06/02/17: Study Conclusions  - Left  ventricle: The cavity size was mildly dilated. Wall   thickness was increased in a pattern of mild LVH. Systolic   function was normal. The estimated ejection fraction was in the   range of 55% to 60%. Wall motion was normal; there were no   regional wall motion abnormalities. The study is not technically   sufficient to allow evaluation of LV diastolic function. - Mitral valve: There was mild regurgitation. - Right ventricle: The cavity size was mildly dilated. - Right atrium: The atrium was mildly dilated.  Impressions:  - Normal LV systolic function; mild LVH and LVE; mild MR; mild RAE   and RVE; mild TR.  Patient Profile     67 y.o. male with metastatic lung cancer, COPD, hypertension and SVT admitted with diverticulitis and bowel perforation.  Hospitalization was complicated by paroxysmal atrial flutter.    Assessment & Plan    # Paroxysmal atrial flutter:  He was treated with diltiazem but blood pressure limited further titration.  He has converted back to sinus rhythm on amiodarone.  We will plan to transition to oral amiodarone today.  Hopefully this will be a short course given his comorbidities.  We will plan to have him follow-up with Dr. Harl Bowie as an outpatient where he will likely stop amiodarone after 1-2 months of therapy.  He is not on anticoagulation 2/2 recent surgery.  If he has recurrent atrial flutter prior to discharge will reassess need for anticoagulation.  # Hyperlipidemia: Continue atorvastatin.  We will follow from [place].  Please call if there are questions.   For questions or updates, please contact [place] Please consult [email or web address] for contact info under Cardiology/STEMI.      Signed, Skeet Latch, MD  06/03/2017, 10:49 AM

## 2017-06-03 NOTE — Progress Notes (Signed)
Went over discharge papers with patient and family.  All questions answered. Prescriptions called in to pharmacy, family aware.  AVS given to patient.  Pt wheeled out via NT.

## 2017-06-03 NOTE — Care Management Note (Signed)
Case Management Note  Patient Details  Name: Aaron Sellers MRN: [id number] Date of Birth: 11-28-1950  Subjective/Objective:                    Action/Plan:d/c home   Expected Discharge Date:  06/03/17               Expected Discharge Plan:  Home/Self Care  In-House Referral:     Discharge planning Services  CM Consult  Post Acute Care Choice:    Choice offered to:     DME Arranged:    DME Agency:     HH Arranged:    [place] Agency:     Status of Service:  Completed, signed off  If discussed at [employer] of Stay Meetings, dates discussed:    Additional Comments:  Dessa Phi, RN 06/03/2017, 1:23 PM

## 2017-06-03 NOTE — Telephone Encounter (Signed)
"  He's about to be discharged.  Labs were drawn today and everyday during hospitalization.  Does he need to return tomorrow for scheduled lab appointment?  We're an hour away but will come if needed."    Verbal order received and read back from Dr. Earlie Server for patient to return next week as scheduled.  No need for tomorrow's appointment.  Order given to [facility] at this time.    Appointment information provided to Hospitalist cal to speak with anyone with Dr. Worthy Flank office.

## 2017-06-04 ENCOUNTER — Ambulatory Visit: Payer: Managed Care, Other (non HMO)

## 2017-06-04 ENCOUNTER — Other Ambulatory Visit: Payer: Managed Care, Other (non HMO)

## 2017-06-04 ENCOUNTER — Ambulatory Visit: Payer: Managed Care, Other (non HMO) | Admitting: Internal Medicine

## 2017-06-04 NOTE — Discharge Summary (Signed)
[facility] Discharge Summary   Patient: Aaron Sellers EUM:[id number]   PCP: Sheryle Hail, PA-C DOB: January 31, 1951   Date of admission: 05/28/2017   Date of discharge: 06/03/2017   Discharge Diagnoses:  Principal Problem:   Diverticulitis of sigmoid colon vs Appendicitis Active Problems:   Diverticulitis of intestine with perforation   Atrial fibrillation with RVR ([place])   COPD (chronic obstructive pulmonary disease) ([place])   Hypertension   Chronic kidney disease   RLQ abdominal pain   Atrial flutter ([place])   Stage IV squamous cell carcinoma of right lung ([place])   Admitted From: home Disposition:  home  Recommendations for Outpatient Follow-up:  1. This follow-up with surgery as recommended.  Follow-up with PCP in 1 week.  Follow-up with cardiology in 1 month.  Follow-up Information    Alphonsa Overall, MD. Call.   Specialty:  General Surgery Why:  call as needed Contact information: [address] [place] [address] [phone]        Sheryle Hail, PA-C. Schedule an appointment as soon as possible for a visit in 1 week(s).   Specialty:  Physician Assistant Contact information: [address] [place] [address] [phone]        gastroenterology. Schedule an appointment as soon as possible for a visit in 2 month(s).   Why:  for colonoscopy.        Arnoldo Lenis, MD Follow up.   Specialty:  Cardiology Why:  Office will conatct you Contact information: [address] [place] [address] [phone]          Diet recommendation: Cardiac diet  Activity: The patient is advised to gradually reintroduce usual activities.  Discharge Condition: good  Code Status: Full code  History of present illness: As per the H and P dictated on admission, "Aaron Sellers is a 67 y.o. male with medical history significant of lung cancer is currently on chemotherapy, COPD, chronic kidney disease, a flutter, hypertension comes in from  oncology office with an abnormal CAT scan today.  Patient had a routine follow-up and his oncologist office today.  He noted and complained that he was having abdominal pain.  He is been having about 3 days of right lower quadrant abdominal pain that is progressively worsening.  He is not having any nausea vomiting or diarrhea.  He has been tolerating liquids but has not been eating as well.  His oncology center sent him to do a stat CT of his abdomen today that showed perforated bowel in the right lower quadrant unclear due to his appendix are diverticulitis.  Patient had an episode of acute diverticulitis last October where he had very similar symptoms.  Patient says he is not in a whole bunch of pain right now.  While in the ED however his heart rate shot up to the 160s.  Patient denied feeling any palpitations during this episode.  I tried carotid massage along with several episodes of doing Valsalva and heat his heart rate did not improve.  I have tried adenosine 6 mg followed by 6 mg followed by 12 mg which also did not break his rhythm.  It did slow his rhythm down and appears as a flutter with rapid response.  Patient is in no respiratory distress.  Patient is being referred for admission for perforated bowel.  General surgery has seen the patient who wished to try conservative measures with antibiotics first and hopefully avoid surgery at this time."  Hospital Course:  Summary  of his active problems in the hospital is as following. Perforated diverticulitis- She was started on IV Zosyn, now we will transition to oral Augmentin. Initially n.p.o., tolerating soft diet Patient's abdomen exam is benign and patient not toxic appearing.  General surgery consulted. Appreciate input. Conservative management recommended.  Active Problems: Atrial flutter with RVR ([place])- patient has failed carotid massage along with Valsalva and several doses of adenosine.  Patient was on IV Cardizem.  Later on  Cardizem dose was increased and patient was also given oral Lopressor.  Despite all these measures patient remained sustained RVR.  Cardiology consulted and patient is currently started on amiodarone.  Monitor.  Appreciate input.  Echocardiogram suggests preserved EF without any wall motion abnormalities.  COPD (chronic obstructive pulmonary disease) ([facility])-stable at this time. Continue neb treatments and supplemental oxygen as needed  Hypertension-stable  Chronic kidney disease-stable with normal creatinine at this time  RLQ abdominal pain-as above  Stage IV squamous cell carcinoma of right lung (H[facility])-continue outpatient follow-up with Dr. Julien Nordmann once he gets over this current medical infectious issue    Diarrhea. From amoxicillin.  No evidence of C. difficile. Add probiotics and advance diet.  All other chronic medical condition were stable during the hospitalization.  Patient was seen by physical therapy, who recommended no PT follow up needed. On the day of the discharge the patient's vitals were stable , and no other acute medical condition were reported by patient. the patient was felt safe to be discharge at home with family.  Procedures and Results:  Echocardiogram   Consultations:  General surgery   DISCHARGE MEDICATION: Allergies as of 06/03/2017      Reactions   Ciprofloxacin    HIVES   Adhesive [tape] Other (See Comments)   Skin peels   Codeine Other (See Comments)   Jitters, "skin crawling"      Medication List    STOP taking these medications   CLARITIN-D 12 HOUR PO   diltiazem 300 MG 24 hr capsule Commonly known as:  CARDIZEM CD   diphenhydrAMINE 25 MG tablet Commonly known as:  BENADRYL   ibuprofen 200 MG tablet Commonly known as:  ADVIL,MOTRIN     TAKE these medications   acetaminophen 500 MG tablet Commonly known as:  TYLENOL Take 1,000 mg by mouth every 6 (six) hours as needed for moderate pain.   acidophilus Caps  capsule Take 1 capsule by mouth daily.   amiodarone 200 MG tablet Commonly known as:  PACERONE Take 1 tablet (200 mg total) by mouth 2 (two) times daily for 10 days.   amiodarone 200 MG tablet Commonly known as:  PACERONE Take 1 tablet (200 mg total) by mouth daily. Start taking on:  06/13/2017   amoxicillin-clavulanate 500-125 MG tablet Commonly known as:  AUGMENTIN Take 1 tablet (500 mg total) by mouth 3 (three) times daily for 3 days.   atorvastatin 10 MG tablet Commonly known as:  LIPITOR Take 10 mg by mouth every evening.   famotidine 10 MG tablet Commonly known as:  PEPCID Take 10 mg by mouth as needed for heartburn or indigestion.   gabapentin 300 MG capsule Commonly known as:  NEURONTIN Take 1 capsule (300 mg total) by mouth 3 (three) times daily. What changed:    when to take this  reasons to take this   HYDROcodone-acetaminophen 5-325 MG tablet Commonly known as:  NORCO/VICODIN Take 1 tablet by mouth every 6 (six) hours as needed for moderate pain or severe pain.   prochlorperazine  10 MG tablet Commonly known as:  COMPAZINE Take 1 tablet (10 mg total) by mouth every 6 (six) hours as needed for nausea or vomiting.   tamsulosin 0.4 MG Caps capsule Commonly known as:  FLOMAX Take 0.4 mg by mouth daily.   traMADol 50 MG tablet Commonly known as:  ULTRAM Take 1-2 tablets (50-100 mg total) by mouth every 6 (six) hours as needed. What changed:  reasons to take this   VITAMIN B-12 PO Take 1 tablet by mouth daily.      Allergies  Allergen Reactions  . Ciprofloxacin     HIVES  . Adhesive [Tape] Other (See Comments)    Skin peels  . Codeine Other (See Comments)    Jitters, "skin crawling"   Discharge Instructions    Diet - low sodium heart healthy   Complete by:  As directed    Discharge instructions   Complete by:  As directed    It is important that you read following instructions as well as go over your medication list with RN to help you  understand your care after this hospitalization.  Discharge Instructions: Please follow-up with PCP in one week  Please request your primary care physician to go over all Hospital Tests and Procedure/Radiological results at the follow up,  Please get all Hospital records sent to your PCP by signing hospital release before you go home.   Do not drive, operating heavy machinery, perform activities at heights, swimming or participation in water activities or provide baby sitting services while you are on Pain, Sleep and Anxiety Medications; until you have been seen by Primary Care Physician or a Neurologist and advised to do so again. Do not take more than prescribed Pain, Sleep and Anxiety Medications. You were cared for by a hospitalist during your hospital stay. If you have any questions about your discharge medications or the care you received while you were in the hospital after you are discharged, you can call the unit and ask to speak with the hospitalist on call if the hospitalist that took care of you is not available.  Once you are discharged, your primary care physician will handle any further medical issues. Please note that NO REFILLS for any discharge medications will be authorized once you are discharged, as it is imperative that you return to your primary care physician (or establish a relationship with a primary care physician if you do not have one) for your aftercare needs so that they can reassess your need for medications and monitor your lab values. You Must read complete instructions/literature along with all the possible adverse reactions/side effects for all the Medicines you take and that have been prescribed to you. Take any new Medicines after you have completely understood and accept all the possible adverse reactions/side effects. Wear Seat belts while driving. If you have smoked or chewed Tobacco in the last 2 yrs please stop smoking and/or stop any Recreational drug use.    Increase activity slowly   Complete by:  As directed      Discharge Exam: Filed Weights   06/01/17 0500 06/02/17 0517 06/03/17 0500  Weight: 131.8 kg (290 lb 9.1 oz) 131.5 kg (290 lb) 130.8 kg (288 lb 4.8 oz)   Vitals:   06/02/17 2140 06/03/17 0501  BP: 135/87 123/77  Pulse: 62 71  Resp: 16 16  Temp: 98.3 F (36.8 C) 99.3 F (37.4 C)  SpO2: 98% 94%   General: Appear in no distress, no Rash; Oral Mucosa moist.  Cardiovascular: S1 and S2 Present, no Murmur, no JVD Respiratory: Bilateral Air entry present and Clear to Auscultation, no Crackles, no wheezes Abdomen: Bowel Sound present, Soft and no tenderness Extremities: no Pedal edema, no calf tenderness Neurology: Grossly no focal neuro deficit.  The results of significant diagnostics from this hospitalization (including imaging, microbiology, ancillary and laboratory) are listed below for reference.    Significant Diagnostic Studies: Ct Chest W Contrast  Result Date: 05/20/2017 CLINICAL DATA:  Non-small-cell lung cancer with metastatic disease to the right thigh. Post radiation therapy. EXAM: CT CHEST, ABDOMEN, AND PELVIS WITH CONTRAST TECHNIQUE: Multidetector CT imaging of the chest, abdomen and pelvis was performed following the standard protocol during bolus administration of intravenous contrast. CONTRAST:  146mL ISOVUE-300 IOPAMIDOL (ISOVUE-300) INJECTION 61% COMPARISON:  PET-CT 02/15/2017. Abdominopelvic CT 01/26/2017 and chest CT 02/27/2017. FINDINGS: CT CHEST FINDINGS Cardiovascular: No acute vascular findings are seen. There is atherosclerosis of the aorta, great vessels and coronary arteries. The heart size is normal. There is no pericardial effusion. Mediastinum/Nodes: Previously demonstrated right paratracheal and right hilar adenopathy has improved. Right hilar node measures 2.5 x 1.6 cm on image 26 (previously 3.7 x 3.0 cm). 13 mm short axis subcarinal node on image 27 previously measured 14 mm. There is no axillary  adenopathy. The thyroid gland, trachea and esophagus demonstrate no significant findings. Lungs/Pleura: There is no pleural effusion. Interval decreased size and cavitation of dominant right upper lobe mass, now measuring 2.2 x 2.0 cm on image 37 (previously 3.3 x 2.7 cm). Tiny left lung nodules on images 31 and 61 are stable. No new or enlarging pulmonary nodules. Musculoskeletal/Chest wall: No chest wall mass or suspicious osseous findings. CT ABDOMEN AND PELVIS FINDINGS Hepatobiliary: The liver is normal in density without focal abnormality. No evidence of gallstones, gallbladder wall thickening or biliary dilatation. Pancreas: Unremarkable. No pancreatic ductal dilatation or surrounding inflammatory changes. Spleen: Normal in size without focal abnormality. Adrenals/Urinary Tract: 3.7 x 2.1 cm right adrenal nodule on image 59 appears unchanged from the baseline study (as remeasured). The left adrenal gland appears normal. There are small nonobstructing bilateral renal calculi. There is an obstructing calculus in the distal right ureter, measuring 5 mm on image 120. This may reflect 2 adjacent stones based on the reformatted images, lying just proximal to the ureterovesical junction. There is mild associated ureterectasis, periureteral soft tissue stranding and delayed contrast excretion. A cyst in the interpolar region of the right kidney appears unchanged. The bladder appears normal. Stomach/Bowel: No evidence of bowel wall thickening, distention or surrounding inflammatory change. The inflammation of the sigmoid colon in the right lower quadrant on the prior study has resolved. The appendix appears normal. Vascular/Lymphatic: There are stable mildly prominent lymph nodes in the porta hepatis and gastrohepatic ligament. There is aortic and branch vessel atherosclerosis with stable mild enlargement of the infrarenal abdominal aorta. No acute vascular findings. Reproductive: The prostate gland appears stable.  Other: No evidence of abdominal wall hernia or mass. No ascites or peritoneal nodularity. Musculoskeletal: No acute or significant osseous findings. Right lower extremity findings dictated separately. IMPRESSION: 1. Interval response to therapy in the thoracic findings. The dominant right upper lobe mass has decreased in size and become cavitary. The right hilar and mediastinal adenopathy has improved. 2. Stable right adrenal nodule, likely an adenoma. No signs of metastatic disease in the abdomen or pelvis. 3. Bilateral renal calculi with development of a partially obstructing distal right ureteral calculus or calculi. 4. Interval resolution of sigmoid diverticulitis. Aortic  Atherosclerosis (ICD10-I70.0). 5. These results will be called to the ordering clinician or representative by the Radiologist Assistant, and communication documented in the PACS or zVision Dashboard. Electronically Signed   By: Richardean Sale M.D.   On: 05/20/2017 14:13   Ct Abdomen Pelvis W Contrast  Result Date: 05/20/2017 CLINICAL DATA:  Non-small-cell lung cancer with metastatic disease to the right thigh. Post radiation therapy. EXAM: CT CHEST, ABDOMEN, AND PELVIS WITH CONTRAST TECHNIQUE: Multidetector CT imaging of the chest, abdomen and pelvis was performed following the standard protocol during bolus administration of intravenous contrast. CONTRAST:  137mL ISOVUE-300 IOPAMIDOL (ISOVUE-300) INJECTION 61% COMPARISON:  PET-CT 02/15/2017. Abdominopelvic CT 01/26/2017 and chest CT 02/27/2017. FINDINGS: CT CHEST FINDINGS Cardiovascular: No acute vascular findings are seen. There is atherosclerosis of the aorta, great vessels and coronary arteries. The heart size is normal. There is no pericardial effusion. Mediastinum/Nodes: Previously demonstrated right paratracheal and right hilar adenopathy has improved. Right hilar node measures 2.5 x 1.6 cm on image 26 (previously 3.7 x 3.0 cm). 13 mm short axis subcarinal node on image 27  previously measured 14 mm. There is no axillary adenopathy. The thyroid gland, trachea and esophagus demonstrate no significant findings. Lungs/Pleura: There is no pleural effusion. Interval decreased size and cavitation of dominant right upper lobe mass, now measuring 2.2 x 2.0 cm on image 37 (previously 3.3 x 2.7 cm). Tiny left lung nodules on images 31 and 61 are stable. No new or enlarging pulmonary nodules. Musculoskeletal/Chest wall: No chest wall mass or suspicious osseous findings. CT ABDOMEN AND PELVIS FINDINGS Hepatobiliary: The liver is normal in density without focal abnormality. No evidence of gallstones, gallbladder wall thickening or biliary dilatation. Pancreas: Unremarkable. No pancreatic ductal dilatation or surrounding inflammatory changes. Spleen: Normal in size without focal abnormality. Adrenals/Urinary Tract: 3.7 x 2.1 cm right adrenal nodule on image 59 appears unchanged from the baseline study (as remeasured). The left adrenal gland appears normal. There are small nonobstructing bilateral renal calculi. There is an obstructing calculus in the distal right ureter, measuring 5 mm on image 120. This may reflect 2 adjacent stones based on the reformatted images, lying just proximal to the ureterovesical junction. There is mild associated ureterectasis, periureteral soft tissue stranding and delayed contrast excretion. A cyst in the interpolar region of the right kidney appears unchanged. The bladder appears normal. Stomach/Bowel: No evidence of bowel wall thickening, distention or surrounding inflammatory change. The inflammation of the sigmoid colon in the right lower quadrant on the prior study has resolved. The appendix appears normal. Vascular/Lymphatic: There are stable mildly prominent lymph nodes in the porta hepatis and gastrohepatic ligament. There is aortic and branch vessel atherosclerosis with stable mild enlargement of the infrarenal abdominal aorta. No acute vascular findings.  Reproductive: The prostate gland appears stable. Other: No evidence of abdominal wall hernia or mass. No ascites or peritoneal nodularity. Musculoskeletal: No acute or significant osseous findings. Right lower extremity findings dictated separately. IMPRESSION: 1. Interval response to therapy in the thoracic findings. The dominant right upper lobe mass has decreased in size and become cavitary. The right hilar and mediastinal adenopathy has improved. 2. Stable right adrenal nodule, likely an adenoma. No signs of metastatic disease in the abdomen or pelvis. 3. Bilateral renal calculi with development of a partially obstructing distal right ureteral calculus or calculi. 4. Interval resolution of sigmoid diverticulitis. Aortic Atherosclerosis (ICD10-I70.0). 5. These results will be called to the ordering clinician or representative by the Radiologist Assistant, and communication documented in the PACS or  zVision Dashboard. Electronically Signed   By: Richardean Sale M.D.   On: 05/20/2017 14:13   Ct Femur Right W Contrast  Result Date: 05/20/2017 CLINICAL DATA:  The patient reports a palpable lesion on the right upper leg. History of lung carcinoma. EXAM: CT OF THE LOWER RIGHT EXTREMITY WITH CONTRAST TECHNIQUE: Multidetector CT imaging of the lower right extremity was performed according to the standard protocol following intravenous contrast administration. COMPARISON:  None. CONTRAST:  100 cc Isovue-300. FINDINGS: Bones/Joint/Cartilage No acute abnormality is identified. No focal lesion is seen. There is some degenerative change about the knee appearing most notable in the medial compartment. Ligaments Suboptimally assessed by CT. Muscles and Tendons Appear normal. No intramuscular mass or fluid collection. No tear or strain is seen. Soft tissues Negative for mass or fluid collection. Atherosclerotic vascular disease noted. Bilateral hydroceles are seen, larger on the right. IMPRESSION: Negative for mass or fluid  collection. No finding to explain the patient's palpated abnormality is identified. Atherosclerosis. Right greater than left hydroceles. Osteoarthritis right knee. Electronically Signed   By: Inge Rise M.D.   On: 05/20/2017 12:43   Ct Renal Stone Study  Result Date: 05/28/2017 CLINICAL DATA:  67 year old male with history of right-sided groin and back pain since 05/25/2017. Gross hematuria. EXAM: CT ABDOMEN AND PELVIS WITHOUT CONTRAST TECHNIQUE: Multidetector CT imaging of the abdomen and pelvis was performed following the standard protocol without IV contrast. COMPARISON:  CT the chest, abdomen and pelvis 05/20/2017. FINDINGS: Lower chest: Subtle areas of septal thickening throughout the visualize lung bases. Atherosclerotic calcifications in the left anterior descending and right coronary arteries. Hepatobiliary: Mild diffuse low attenuation throughout the hepatic parenchyma, compatible with a background of hepatic steatosis. Liver has a slightly shrunken appearance and nodular contour, indicative of underlying cirrhosis. No definite cystic or solid hepatic lesions are confidently identified on today's noncontrast CT examination. Unenhanced appearance of the gallbladder is normal. Pancreas: No definite pancreatic mass or peripancreatic fluid or inflammatory changes are noted on today's noncontrast CT examination. Spleen: Unremarkable. Adrenals/Urinary Tract: 2.8 x 2.1 cm low-attenuation (5 HU) right adrenal nodule, similar to the prior examination, compatible with a adenoma. Left adrenal gland is normal in appearance. In the lateral aspect of the upper pole the right kidney there is a 2.2 cm low-attenuation lesion which is incompletely characterized on today's noncontrast CT examination, but was compatible with a simple cyst on the recent prior study. Multiple nonobstructive calculi are noted within the collecting systems of both kidneys measuring 2-3 mm in size. In addition, in the distal third of the  right ureter shortly before the right ureterovesicular junction (axial image 91 of series 2 and coronal image 133 of series 6) there are 2 adjacent tiny calculi measuring 3 mm. No significant proximal right hydroureteronephrosis. The ureter around the distal calculi does appear to be thickened, presumably inflamed. No left ureteral stones. No bladder stones. Unenhanced appearance of the urinary bladder is unremarkable. Stomach/Bowel: Unenhanced appearance of the stomach is normal. No pathologic dilatation of small bowel or colon. Numerous colonic diverticulae are again noted, most evident in the sigmoid colon. In the right lower quadrant adjacent to both a loop of the sigmoid colon and adjacent to the appendix there is extensive inflammation with a trace amount of fluid (axial image 70 of series 3) without a well-defined abscess. These findings could reflect either an acute diverticulitis or an acute appendicitis. Notably, there is a small amount of pneumoperitoneum, indicative of perforation. Vascular/Lymphatic: Aortic atherosclerosis with fusiform ectasia  of the infrarenal abdominal aorta which measures up to 2.8 cm in diameter. No lymphadenopathy noted in the abdomen or pelvis. Reproductive: Prostate gland and seminal vesicles are unremarkable in appearance. Other: No significant volume of ascites. Trace volume of pneumoperitoneum. Musculoskeletal: There are no aggressive appearing lytic or blastic lesions noted in the visualized portions of the skeleton. IMPRESSION: 1. Inflammatory process in the right lower quadrant of the abdomen adjacent to the appendix and to several diverticulae in the mid sigmoid colon. It is uncertain whether or not this represents an acute diverticulitis or an acute appendicitis. Regardless, there are extensive surrounding inflammatory changes, there is a trace volume of adjacent fluid which likely reflects a developing phlegmon, and there is clear evidence of perforation demonstrated by  small volume of pneumoperitoneum. Emergent surgical consultation is strongly recommended. 2. Two tiny 3 mm calculi in the distal third of the right ureter shortly before the right ureterovesicular junction. No proximal hydroureteronephrosis at this time. Additional tiny 2-3 mm nonobstructive calculi are noted within the collecting systems of both kidneys. 3. Aortic atherosclerosis. 4. Subtle changes in the lung bases bilaterally which could be indicative of early or mild interstitial lung disease. Follow-up nonemergent high-resolution chest CT is suggested in the next several months to better evaluate these findings. 5. Hepatic steatosis with evidence of underlying hepatic cirrhosis, as above. 6. Stable right adrenal adenoma. Aortic Atherosclerosis (ICD10-I70.0). Critical Value/emergent results were called by telephone at the time of interpretation on 05/28/2017 at 2:34 pm to Mitchell , who verbally acknowledged these results. Electronically Signed   By: Vinnie Langton M.D.   On: 05/28/2017 14:34    Microbiology: Recent Results (from the past 240 hour(s))  Urine Culture     Status: None   Collection Time: 05/28/17 12:46 PM  Result Value Ref Range Status   Specimen Description   Final    URINE, CLEAN CATCH Performed at [facility], [address] [place], [place] [address]    Special Requests   Final    NONE Performed at [facility], [place] [address] [place], [place] [address]    Culture   Final    NO GROWTH Performed at [facility], [place] [address], [place], [place] [address]    Report Status 05/29/2017 FINAL  Final  MRSA PCR Screening     Status: None   Collection Time: 05/29/17  2:00 AM  Result Value Ref Range Status   MRSA by PCR NEGATIVE NEGATIVE Final    Comment:        The GeneXpert MRSA Assay (FDA approved for NASAL specimens only), is one component of a comprehensive MRSA colonization surveillance program. It is  not intended to diagnose MRSA infection nor to guide or monitor treatment for MRSA infections. Performed at [facility], [place] [address], [place], [place] [address]      Labs: CBC: Recent Labs  Lab 05/30/17 0255 05/31/17 0744 06/01/17 0454 06/02/17 0541 06/03/17 0557  WBC 15.1* 17.4* 17.4* 12.2* 12.3*  NEUTROABS 12.0  --   --   --   --   HGB 11.7* 12.1* 11.6* 11.5* 12.4*  HCT 36.1* 36.8* 35.6* 34.8* 37.4*  MCV 91.2 90.6 91.3 88.8 90.6  PLT 179 185 204 189 062   Basic Metabolic Panel: Recent Labs  Lab 05/30/17 0255 05/31/17 0744 06/01/17 0454 06/02/17 0541 06/03/17 0557  NA 139 139 142 137 141  K 3.6 3.9 3.8 3.5 3.8  CL  105 105 107 102 105  CO2 27 27 28 27 27   GLUCOSE 91 104* 104* 103* 103*  BUN 17 10 6  5* 6  CREATININE 0.81 0.72 0.69 0.78 0.76  CALCIUM 8.6* 8.8* 8.5* 8.7* 9.2  MG 1.9  --  1.6*  --   --    Liver Function Tests: Recent Labs  Lab 05/30/17 0255 05/31/17 0744  AST 22 21  ALT 36 29  ALKPHOS 94 105  BILITOT 1.0 0.5  PROT 6.0* 6.0*  ALBUMIN 3.2* 3.1*   No results for input(s): LIPASE, AMYLASE in the last 168 hours. No results for input(s): AMMONIA in the last 168 hours. Cardiac Enzymes: No results for input(s): CKTOTAL, CKMB, CKMBINDEX, TROPONINI in the last 168 hours. BNP (last 3 results) No results for input(s): BNP in the last 8760 hours. CBG: No results for input(s): GLUCAP in the last 168 hours. Time spent: 35 minutes  Signed:  Berle Mull  [facility] 06/03/2017 10:16 PM

## 2017-06-05 ENCOUNTER — Ambulatory Visit: Payer: Managed Care, Other (non HMO) | Admitting: Student

## 2017-06-10 ENCOUNTER — Other Ambulatory Visit: Payer: Self-pay | Admitting: Cardiology

## 2017-06-10 MED ORDER — AMIODARONE HCL 200 MG PO TABS: ORAL_TABLET | ORAL | 2 refills | Status: DC

## 2017-06-10 NOTE — Telephone Encounter (Signed)
On 06/13/17, Amiodarone goes down to 200 mg daily.E-scribed rx to pharmacy

## 2017-06-10 NOTE — Telephone Encounter (Signed)
Patient needs RX for amlodopine 200mg  sent to [facility] for new dosage post hospital. Please call if you have any questions./ tg

## 2017-06-11 ENCOUNTER — Inpatient Hospital Stay (HOSPITAL_BASED_OUTPATIENT_CLINIC_OR_DEPARTMENT_OTHER): Payer: Managed Care, Other (non HMO) | Admitting: Internal Medicine

## 2017-06-11 ENCOUNTER — Inpatient Hospital Stay: Payer: Managed Care, Other (non HMO)

## 2017-06-11 ENCOUNTER — Encounter: Payer: Self-pay | Admitting: Internal Medicine

## 2017-06-11 ENCOUNTER — Inpatient Hospital Stay: Payer: Managed Care, Other (non HMO) | Attending: Oncology

## 2017-06-11 ENCOUNTER — Telehealth: Payer: Self-pay

## 2017-06-11 VITALS — BP 129/73 | HR 69 | Temp 97.9°F | Resp 24 | Ht 79.0 in | Wt 288.4 lb

## 2017-06-11 DIAGNOSIS — Z79899 Other long term (current) drug therapy: Secondary | ICD-10-CM | POA: Insufficient documentation

## 2017-06-11 DIAGNOSIS — I129 Hypertensive chronic kidney disease with stage 1 through stage 4 chronic kidney disease, or unspecified chronic kidney disease: Secondary | ICD-10-CM | POA: Insufficient documentation

## 2017-06-11 DIAGNOSIS — J449 Chronic obstructive pulmonary disease, unspecified: Secondary | ICD-10-CM | POA: Insufficient documentation

## 2017-06-11 DIAGNOSIS — Z5112 Encounter for antineoplastic immunotherapy: Secondary | ICD-10-CM | POA: Insufficient documentation

## 2017-06-11 DIAGNOSIS — Z923 Personal history of irradiation: Secondary | ICD-10-CM | POA: Insufficient documentation

## 2017-06-11 DIAGNOSIS — Z9221 Personal history of antineoplastic chemotherapy: Secondary | ICD-10-CM | POA: Diagnosis not present

## 2017-06-11 DIAGNOSIS — E7800 Pure hypercholesterolemia, unspecified: Secondary | ICD-10-CM | POA: Insufficient documentation

## 2017-06-11 DIAGNOSIS — Z720 Tobacco use: Secondary | ICD-10-CM

## 2017-06-11 DIAGNOSIS — C3411 Malignant neoplasm of upper lobe, right bronchus or lung: Secondary | ICD-10-CM | POA: Diagnosis not present

## 2017-06-11 DIAGNOSIS — N189 Chronic kidney disease, unspecified: Secondary | ICD-10-CM | POA: Insufficient documentation

## 2017-06-11 DIAGNOSIS — C7951 Secondary malignant neoplasm of bone: Secondary | ICD-10-CM | POA: Insufficient documentation

## 2017-06-11 DIAGNOSIS — C3491 Malignant neoplasm of unspecified part of right bronchus or lung: Secondary | ICD-10-CM

## 2017-06-11 DIAGNOSIS — R5382 Chronic fatigue, unspecified: Secondary | ICD-10-CM

## 2017-06-11 LAB — COMPREHENSIVE METABOLIC PANEL
ALT: 31 U/L (ref 0–55)
ANION GAP: 8 (ref 3–11)
AST: 21 U/L (ref 5–34)
Albumin: 3.3 g/dL — ABNORMAL LOW (ref 3.5–5.0)
Alkaline Phosphatase: 94 U/L (ref 40–150)
BUN: 11 mg/dL (ref 7–26)
CALCIUM: 9.5 mg/dL (ref 8.4–10.4)
CHLORIDE: 103 mmol/L (ref 98–109)
CO2: 27 mmol/L (ref 22–29)
Creatinine, Ser: 0.82 mg/dL (ref 0.70–1.30)
Glucose, Bld: 136 mg/dL (ref 70–140)
Potassium: 4 mmol/L (ref 3.5–5.1)
SODIUM: 138 mmol/L (ref 136–145)
Total Bilirubin: 0.5 mg/dL (ref 0.2–1.2)
Total Protein: 6.8 g/dL (ref 6.4–8.3)

## 2017-06-11 LAB — CBC WITH DIFFERENTIAL/PLATELET
BASOS PCT: 1 %
Basophils Absolute: 0.1 10*3/uL (ref 0.0–0.1)
Eosinophils Absolute: 0.1 10*3/uL (ref 0.0–0.5)
Eosinophils Relative: 1 %
HEMATOCRIT: 39.9 % (ref 38.4–49.9)
HEMOGLOBIN: 12.9 g/dL — AB (ref 13.0–17.1)
LYMPHS ABS: 1.4 10*3/uL (ref 0.9–3.3)
Lymphocytes Relative: 18 %
MCH: 29.4 pg (ref 27.2–33.4)
MCHC: 32.3 g/dL (ref 32.0–36.0)
MCV: 90.9 fL (ref 79.3–98.0)
Monocytes Absolute: 0.9 10*3/uL (ref 0.1–0.9)
Monocytes Relative: 11 %
NEUTROS ABS: 5.6 10*3/uL (ref 1.5–6.5)
NEUTROS PCT: 69 %
Platelets: 216 10*3/uL (ref 140–400)
RBC: 4.39 MIL/uL (ref 4.20–5.82)
RDW: 15.5 % — ABNORMAL HIGH (ref 11.0–14.6)
WBC: 8 10*3/uL (ref 4.0–10.3)

## 2017-06-11 LAB — TSH: TSH: 1.951 u[IU]/mL (ref 0.320–4.118)

## 2017-06-11 MED ORDER — SODIUM CHLORIDE 0.9 % IV SOLN
Freq: Once | INTRAVENOUS | Status: AC
  Administered 2017-06-11: 13:00:00 via INTRAVENOUS

## 2017-06-11 MED ORDER — SODIUM CHLORIDE 0.9 % IV SOLN
200.0000 mg | Freq: Once | INTRAVENOUS | Status: AC
  Administered 2017-06-11: 200 mg via INTRAVENOUS
  Filled 2017-06-11: qty 8

## 2017-06-11 NOTE — Patient Instructions (Signed)
[facility] Discharge Instructions for Patients Receiving Chemotherapy  Today you received the following chemotherapy agents:  Keytruda.  To help prevent nausea and vomiting after your treatment, we encourage you to take your nausea medication as directed.   If you develop nausea and vomiting that is not controlled by your nausea medication, call the clinic.   BELOW ARE SYMPTOMS THAT SHOULD BE REPORTED IMMEDIATELY:  *FEVER GREATER THAN 100.5 F  *CHILLS WITH OR WITHOUT FEVER  NAUSEA AND VOMITING THAT IS NOT CONTROLLED WITH YOUR NAUSEA MEDICATION  *UNUSUAL SHORTNESS OF BREATH  *UNUSUAL BRUISING OR BLEEDING  TENDERNESS IN MOUTH AND THROAT WITH OR WITHOUT PRESENCE OF ULCERS  *URINARY PROBLEMS  *BOWEL PROBLEMS  UNUSUAL RASH Items with * indicate a potential emergency and should be followed up as soon as possible.  Feel free to call the clinic should you have any questions or concerns. The clinic phone number is [phone].  Please show the CHEMO ALERT CARD at check-in to the Emergency Department and triage nurse.    

## 2017-06-11 NOTE — Telephone Encounter (Signed)
Printed avs and calender of upcoming appointment. Per 3/12, also called RN ton verify that labs are needed every 3 weeks with inf., lab, and ov.Stanton Kidney)

## 2017-06-11 NOTE — Progress Notes (Signed)
[place] [phone]   [phone]  OFFICE PROGRESS NOTE  Sheryle Hail, PA-C [address] [place] [address]  DIAGNOSIS: Stage IV (T2a,N3,M1c)non-small cell lung cancer, squamous cell carcinoma presented with right upper lobe lung mass in addition to right hilar and mediastinal adenopathy as well as metastatic disease in the medial right thigh diagnosed in November 2018.  PRIOR THERAPY:  1) Palliative radiotherapy to the right lower extremity mass completed April 09, 2017. 2) Systemic chemotherapy with carboplatin for AUC of 5, paclitaxel 175 mg/M2 and Keytruda 200 mg IV every 3 weeks.  First dose March 12, 2017, status post 4 cycles with partial response.  CURRENT THERAPY: Maintenance treatment with single agent Keytruda 200 mg IV every 3 weeks.  First cycle today.  INTERVAL HISTORY: Aaron Sellers 67 y.o. male returns to the clinic today for follow-up visit accompanied by his wife.  The patient is feeling fine today with no specific complaints.  He was recently admitted to [facility] with perforated diverticulum.  He was treated medically and he is feeling much better.  He has bowel movement every day and sometimes every other day.  He denied having any chest pain, shortness breath, cough or hemoptysis.  He denied having any fever or chills.  He has no nausea, vomiting, diarrhea or constipation.  He is here today for evaluation before starting the first cycle of maintenance treatment with immunotherapy with Keytruda.  MEDICAL HISTORY: Past Medical History:  Diagnosis Date  . Arthritis   . Atrial flutter ([place])   . BPH (benign prostatic hypertrophy) with urinary retention   . Cancer ([place])   . Chronic kidney disease    hx of kidney stones  2011  . COPD (chronic obstructive pulmonary disease) ([place])    has been smoking since he was 22 ish--  . High cholesterol   . Hypertension    dx around 2011  . Squamous cell  carcinoma of right lung ([facility])     ALLERGIES:  is allergic to ciprofloxacin; adhesive [tape]; and codeine.  MEDICATIONS:  Current Outpatient Medications  Medication Sig Dispense Refill  . acetaminophen (TYLENOL) 500 MG tablet Take 1,000 mg by mouth every 6 (six) hours as needed for moderate pain.    Marland Kitchen acidophilus (RISAQUAD) CAPS capsule Take 1 capsule by mouth daily. 10 capsule 0  . amiodarone (PACERONE) 200 MG tablet Starting 06/13/17, TAKE 200 MG DAILY 90 tablet 2  . atorvastatin (LIPITOR) 10 MG tablet Take 10 mg by mouth every evening.     . Cyanocobalamin (VITAMIN B-12 PO) Take 1 tablet by mouth daily.    . famotidine (PEPCID) 10 MG tablet Take 10 mg by mouth as needed for heartburn or indigestion.    . gabapentin (NEURONTIN) 300 MG capsule Take 1 capsule (300 mg total) by mouth 3 (three) times daily. (Patient taking differently: Take 300 mg by mouth 2 (two) times daily as needed (pain). ) 90 capsule 1  . HYDROcodone-acetaminophen (NORCO/VICODIN) 5-325 MG tablet Take 1 tablet by mouth every 6 (six) hours as needed for moderate pain or severe pain. 30 tablet 0  . prochlorperazine (COMPAZINE) 10 MG tablet Take 1 tablet (10 mg total) by mouth every 6 (six) hours as needed for nausea or vomiting. 30 tablet 0  . tamsulosin (FLOMAX) 0.4 MG CAPS capsule Take 0.4 mg by mouth daily.    . traMADol (ULTRAM) 50 MG tablet Take 1-2 tablets (50-100 mg total) by mouth every 6 (six)  hours as needed. (Patient taking differently: Take 50-100 mg by mouth every 6 (six) hours as needed for moderate pain. ) 60 tablet 0  . Lactobacillus Acid-Pectin (ACIDOPHILUS/PECTIN) CAPS acidophilus 100 million cell-pectin, citrus 10 mg capsule  Take by oral route.    . vitamin B-12 (CYANOCOBALAMIN) 100 MCG tablet Vitamin B12     No current facility-administered medications for this visit.    Facility-Administered Medications Ordered in Other Visits  Medication Dose Route Frequency Provider Last Rate Last Dose  . heparin  lock flush 100 unit/mL  500 Units Intracatheter Once PRN Curt Bears, MD      . sodium chloride flush (NS) 0.9 % injection 10 mL  10 mL Intracatheter PRN Curt Bears, MD      . sodium chloride flush (NS) 0.9 % injection 10 mL  10 mL Intracatheter PRN Curt Bears, MD        SURGICAL HISTORY:  Past Surgical History:  Procedure Laterality Date  . HERNIA REPAIR     umbilical hernia  04/6107  . INCISION AND DRAINAGE ABSCESS     "down the middle" of his buttocks  2015  . TOTAL KNEE ARTHROPLASTY Left 10/29/2014   Procedure: TOTAL KNEE ARTHROPLASTY;  Surgeon: Dorna Leitz, MD;  Location: [facility];  Service: Orthopedics;  Laterality: Left;    REVIEW OF SYSTEMS:  A comprehensive review of systems was negative except for: Constitutional: positive for fatigue   PHYSICAL EXAMINATION: General appearance: alert, cooperative and no distress Head: Normocephalic, without obvious abnormality, atraumatic Neck: no adenopathy, no JVD, supple, symmetrical, trachea midline and thyroid not enlarged, symmetric, no tenderness/mass/nodules Lymph nodes: Cervical, supraclavicular, and axillary nodes normal. Resp: clear to auscultation bilaterally Back: symmetric, no curvature. ROM normal. No CVA tenderness. Cardio: regular rate and rhythm, S1, S2 normal, no murmur, click, rub or gallop GI: soft, non-tender; bowel sounds normal; no masses,  no organomegaly Extremities: extremities normal, atraumatic, no cyanosis or edema  ECOG PERFORMANCE STATUS: 1 - Symptomatic but completely ambulatory  Blood pressure 129/73, pulse 69, temperature 97.9 F (36.6 C), temperature source Oral, resp. rate (!) 24, height 6\' 7"  (2.007 m), weight 288 lb 6.4 oz (130.8 kg), SpO2 97 %.  LABORATORY DATA: Lab Results  Component Value Date   WBC 8.0 06/11/2017   HGB 12.9 (L) 06/11/2017   HCT 39.9 06/11/2017   MCV 90.9 06/11/2017   PLT 216 06/11/2017      Chemistry      Component Value Date/Time   NA 138 06/11/2017 1045     NA 137 04/03/2017 1030   K 4.0 06/11/2017 1045   K 4.1 04/03/2017 1030   CL 103 06/11/2017 1045   CO2 27 06/11/2017 1045   CO2 27 04/03/2017 1030   BUN 11 06/11/2017 1045   BUN 15.5 04/03/2017 1030   CREATININE 0.82 06/11/2017 1045   CREATININE 0.8 04/03/2017 1030      Component Value Date/Time   CALCIUM 9.5 06/11/2017 1045   CALCIUM 9.4 04/03/2017 1030   ALKPHOS 94 06/11/2017 1045   ALKPHOS 97 04/03/2017 1030   AST 21 06/11/2017 1045   AST 14 04/03/2017 1030   ALT 31 06/11/2017 1045   ALT 21 04/03/2017 1030   BILITOT 0.5 06/11/2017 1045   BILITOT 0.67 04/03/2017 1030       RADIOGRAPHIC STUDIES: Ct Chest W Contrast  Result Date: 05/20/2017 CLINICAL DATA:  Non-small-cell lung cancer with metastatic disease to the right thigh. Post radiation therapy. EXAM: CT CHEST, ABDOMEN, AND PELVIS WITH CONTRAST TECHNIQUE:  Multidetector CT imaging of the chest, abdomen and pelvis was performed following the standard protocol during bolus administration of intravenous contrast. CONTRAST:  145mL ISOVUE-300 IOPAMIDOL (ISOVUE-300) INJECTION 61% COMPARISON:  PET-CT 02/15/2017. Abdominopelvic CT 01/26/2017 and chest CT 02/27/2017. FINDINGS: CT CHEST FINDINGS Cardiovascular: No acute vascular findings are seen. There is atherosclerosis of the aorta, great vessels and coronary arteries. The heart size is normal. There is no pericardial effusion. Mediastinum/Nodes: Previously demonstrated right paratracheal and right hilar adenopathy has improved. Right hilar node measures 2.5 x 1.6 cm on image 26 (previously 3.7 x 3.0 cm). 13 mm short axis subcarinal node on image 27 previously measured 14 mm. There is no axillary adenopathy. The thyroid gland, trachea and esophagus demonstrate no significant findings. Lungs/Pleura: There is no pleural effusion. Interval decreased size and cavitation of dominant right upper lobe mass, now measuring 2.2 x 2.0 cm on image 37 (previously 3.3 x 2.7 cm). Tiny left lung nodules  on images 31 and 61 are stable. No new or enlarging pulmonary nodules. Musculoskeletal/Chest wall: No chest wall mass or suspicious osseous findings. CT ABDOMEN AND PELVIS FINDINGS Hepatobiliary: The liver is normal in density without focal abnormality. No evidence of gallstones, gallbladder wall thickening or biliary dilatation. Pancreas: Unremarkable. No pancreatic ductal dilatation or surrounding inflammatory changes. Spleen: Normal in size without focal abnormality. Adrenals/Urinary Tract: 3.7 x 2.1 cm right adrenal nodule on image 59 appears unchanged from the baseline study (as remeasured). The left adrenal gland appears normal. There are small nonobstructing bilateral renal calculi. There is an obstructing calculus in the distal right ureter, measuring 5 mm on image 120. This may reflect 2 adjacent stones based on the reformatted images, lying just proximal to the ureterovesical junction. There is mild associated ureterectasis, periureteral soft tissue stranding and delayed contrast excretion. A cyst in the interpolar region of the right kidney appears unchanged. The bladder appears normal. Stomach/Bowel: No evidence of bowel wall thickening, distention or surrounding inflammatory change. The inflammation of the sigmoid colon in the right lower quadrant on the prior study has resolved. The appendix appears normal. Vascular/Lymphatic: There are stable mildly prominent lymph nodes in the porta hepatis and gastrohepatic ligament. There is aortic and branch vessel atherosclerosis with stable mild enlargement of the infrarenal abdominal aorta. No acute vascular findings. Reproductive: The prostate gland appears stable. Other: No evidence of abdominal wall hernia or mass. No ascites or peritoneal nodularity. Musculoskeletal: No acute or significant osseous findings. Right lower extremity findings dictated separately. IMPRESSION: 1. Interval response to therapy in the thoracic findings. The dominant right upper lobe  mass has decreased in size and become cavitary. The right hilar and mediastinal adenopathy has improved. 2. Stable right adrenal nodule, likely an adenoma. No signs of metastatic disease in the abdomen or pelvis. 3. Bilateral renal calculi with development of a partially obstructing distal right ureteral calculus or calculi. 4. Interval resolution of sigmoid diverticulitis. Aortic Atherosclerosis (ICD10-I70.0). 5. These results will be called to the ordering clinician or representative by the Radiologist Assistant, and communication documented in the PACS or zVision Dashboard. Electronically Signed   By: Richardean Sale M.D.   On: 05/20/2017 14:13   Ct Abdomen Pelvis W Contrast  Result Date: 05/20/2017 CLINICAL DATA:  Non-small-cell lung cancer with metastatic disease to the right thigh. Post radiation therapy. EXAM: CT CHEST, ABDOMEN, AND PELVIS WITH CONTRAST TECHNIQUE: Multidetector CT imaging of the chest, abdomen and pelvis was performed following the standard protocol during bolus administration of intravenous contrast. CONTRAST:  158mL ISOVUE-300  IOPAMIDOL (ISOVUE-300) INJECTION 61% COMPARISON:  PET-CT 02/15/2017. Abdominopelvic CT 01/26/2017 and chest CT 02/27/2017. FINDINGS: CT CHEST FINDINGS Cardiovascular: No acute vascular findings are seen. There is atherosclerosis of the aorta, great vessels and coronary arteries. The heart size is normal. There is no pericardial effusion. Mediastinum/Nodes: Previously demonstrated right paratracheal and right hilar adenopathy has improved. Right hilar node measures 2.5 x 1.6 cm on image 26 (previously 3.7 x 3.0 cm). 13 mm short axis subcarinal node on image 27 previously measured 14 mm. There is no axillary adenopathy. The thyroid gland, trachea and esophagus demonstrate no significant findings. Lungs/Pleura: There is no pleural effusion. Interval decreased size and cavitation of dominant right upper lobe mass, now measuring 2.2 x 2.0 cm on image 37 (previously 3.3  x 2.7 cm). Tiny left lung nodules on images 31 and 61 are stable. No new or enlarging pulmonary nodules. Musculoskeletal/Chest wall: No chest wall mass or suspicious osseous findings. CT ABDOMEN AND PELVIS FINDINGS Hepatobiliary: The liver is normal in density without focal abnormality. No evidence of gallstones, gallbladder wall thickening or biliary dilatation. Pancreas: Unremarkable. No pancreatic ductal dilatation or surrounding inflammatory changes. Spleen: Normal in size without focal abnormality. Adrenals/Urinary Tract: 3.7 x 2.1 cm right adrenal nodule on image 59 appears unchanged from the baseline study (as remeasured). The left adrenal gland appears normal. There are small nonobstructing bilateral renal calculi. There is an obstructing calculus in the distal right ureter, measuring 5 mm on image 120. This may reflect 2 adjacent stones based on the reformatted images, lying just proximal to the ureterovesical junction. There is mild associated ureterectasis, periureteral soft tissue stranding and delayed contrast excretion. A cyst in the interpolar region of the right kidney appears unchanged. The bladder appears normal. Stomach/Bowel: No evidence of bowel wall thickening, distention or surrounding inflammatory change. The inflammation of the sigmoid colon in the right lower quadrant on the prior study has resolved. The appendix appears normal. Vascular/Lymphatic: There are stable mildly prominent lymph nodes in the porta hepatis and gastrohepatic ligament. There is aortic and branch vessel atherosclerosis with stable mild enlargement of the infrarenal abdominal aorta. No acute vascular findings. Reproductive: The prostate gland appears stable. Other: No evidence of abdominal wall hernia or mass. No ascites or peritoneal nodularity. Musculoskeletal: No acute or significant osseous findings. Right lower extremity findings dictated separately. IMPRESSION: 1. Interval response to therapy in the thoracic  findings. The dominant right upper lobe mass has decreased in size and become cavitary. The right hilar and mediastinal adenopathy has improved. 2. Stable right adrenal nodule, likely an adenoma. No signs of metastatic disease in the abdomen or pelvis. 3. Bilateral renal calculi with development of a partially obstructing distal right ureteral calculus or calculi. 4. Interval resolution of sigmoid diverticulitis. Aortic Atherosclerosis (ICD10-I70.0). 5. These results will be called to the ordering clinician or representative by the Radiologist Assistant, and communication documented in the PACS or zVision Dashboard. Electronically Signed   By: Richardean Sale M.D.   On: 05/20/2017 14:13   Ct Femur Right W Contrast  Result Date: 05/20/2017 CLINICAL DATA:  The patient reports a palpable lesion on the right upper leg. History of lung carcinoma. EXAM: CT OF THE LOWER RIGHT EXTREMITY WITH CONTRAST TECHNIQUE: Multidetector CT imaging of the lower right extremity was performed according to the standard protocol following intravenous contrast administration. COMPARISON:  None. CONTRAST:  100 cc Isovue-300. FINDINGS: Bones/Joint/Cartilage No acute abnormality is identified. No focal lesion is seen. There is some degenerative change about the knee  appearing most notable in the medial compartment. Ligaments Suboptimally assessed by CT. Muscles and Tendons Appear normal. No intramuscular mass or fluid collection. No tear or strain is seen. Soft tissues Negative for mass or fluid collection. Atherosclerotic vascular disease noted. Bilateral hydroceles are seen, larger on the right. IMPRESSION: Negative for mass or fluid collection. No finding to explain the patient's palpated abnormality is identified. Atherosclerosis. Right greater than left hydroceles. Osteoarthritis right knee. Electronically Signed   By: Inge Rise M.D.   On: 05/20/2017 12:43   Ct Renal Stone Study  Result Date: 05/28/2017 CLINICAL DATA:   67 year old male with history of right-sided groin and back pain since 05/25/2017. Gross hematuria. EXAM: CT ABDOMEN AND PELVIS WITHOUT CONTRAST TECHNIQUE: Multidetector CT imaging of the abdomen and pelvis was performed following the standard protocol without IV contrast. COMPARISON:  CT the chest, abdomen and pelvis 05/20/2017. FINDINGS: Lower chest: Subtle areas of septal thickening throughout the visualize lung bases. Atherosclerotic calcifications in the left anterior descending and right coronary arteries. Hepatobiliary: Mild diffuse low attenuation throughout the hepatic parenchyma, compatible with a background of hepatic steatosis. Liver has a slightly shrunken appearance and nodular contour, indicative of underlying cirrhosis. No definite cystic or solid hepatic lesions are confidently identified on today's noncontrast CT examination. Unenhanced appearance of the gallbladder is normal. Pancreas: No definite pancreatic mass or peripancreatic fluid or inflammatory changes are noted on today's noncontrast CT examination. Spleen: Unremarkable. Adrenals/Urinary Tract: 2.8 x 2.1 cm low-attenuation (5 HU) right adrenal nodule, similar to the prior examination, compatible with a adenoma. Left adrenal gland is normal in appearance. In the lateral aspect of the upper pole the right kidney there is a 2.2 cm low-attenuation lesion which is incompletely characterized on today's noncontrast CT examination, but was compatible with a simple cyst on the recent prior study. Multiple nonobstructive calculi are noted within the collecting systems of both kidneys measuring 2-3 mm in size. In addition, in the distal third of the right ureter shortly before the right ureterovesicular junction (axial image 91 of series 2 and coronal image 133 of series 6) there are 2 adjacent tiny calculi measuring 3 mm. No significant proximal right hydroureteronephrosis. The ureter around the distal calculi does appear to be thickened,  presumably inflamed. No left ureteral stones. No bladder stones. Unenhanced appearance of the urinary bladder is unremarkable. Stomach/Bowel: Unenhanced appearance of the stomach is normal. No pathologic dilatation of small bowel or colon. Numerous colonic diverticulae are again noted, most evident in the sigmoid colon. In the right lower quadrant adjacent to both a loop of the sigmoid colon and adjacent to the appendix there is extensive inflammation with a trace amount of fluid (axial image 70 of series 3) without a well-defined abscess. These findings could reflect either an acute diverticulitis or an acute appendicitis. Notably, there is a small amount of pneumoperitoneum, indicative of perforation. Vascular/Lymphatic: Aortic atherosclerosis with fusiform ectasia of the infrarenal abdominal aorta which measures up to 2.8 cm in diameter. No lymphadenopathy noted in the abdomen or pelvis. Reproductive: Prostate gland and seminal vesicles are unremarkable in appearance. Other: No significant volume of ascites. Trace volume of pneumoperitoneum. Musculoskeletal: There are no aggressive appearing lytic or blastic lesions noted in the visualized portions of the skeleton. IMPRESSION: 1. Inflammatory process in the right lower quadrant of the abdomen adjacent to the appendix and to several diverticulae in the mid sigmoid colon. It is uncertain whether or not this represents an acute diverticulitis or an acute appendicitis. Regardless, there are  extensive surrounding inflammatory changes, there is a trace volume of adjacent fluid which likely reflects a developing phlegmon, and there is clear evidence of perforation demonstrated by small volume of pneumoperitoneum. Emergent surgical consultation is strongly recommended. 2. Two tiny 3 mm calculi in the distal third of the right ureter shortly before the right ureterovesicular junction. No proximal hydroureteronephrosis at this time. Additional tiny 2-3 mm nonobstructive  calculi are noted within the collecting systems of both kidneys. 3. Aortic atherosclerosis. 4. Subtle changes in the lung bases bilaterally which could be indicative of early or mild interstitial lung disease. Follow-up nonemergent high-resolution chest CT is suggested in the next several months to better evaluate these findings. 5. Hepatic steatosis with evidence of underlying hepatic cirrhosis, as above. 6. Stable right adrenal adenoma. Aortic Atherosclerosis (ICD10-I70.0). Critical Value/emergent results were called by telephone at the time of interpretation on 05/28/2017 at 2:34 pm to [place] , who verbally acknowledged these results. Electronically Signed   By: Vinnie Langton M.D.   On: 05/28/2017 14:34    ASSESSMENT AND PLAN: This is a very pleasant 67 years old white male with a stage IV non-small cell lung cancer, squamous cell carcinoma.  The patient is currently undergoing systemic chemotherapy with carboplatin, paclitaxel and Keytruda status post 4 cycles with partial response. I recommended for the patient to proceed with the maintenance treatment with single agent Keytruda 200 mg IV every 3 weeks.  We will start the first dose of his treatment today. The patient will come back for follow-up visit in 3 weeks for evaluation before starting cycle #2. He was advised to call immediately if he has any concerning symptoms in the interval. The patient voices understanding of current disease status and treatment options and is in agreement with the current care plan. All questions were answered. The patient knows to call the clinic with any problems, questions or concerns. We can certainly see the patient much sooner if necessary. I spent 10 minutes counseling the patient face to face. The total time spent in the appointment was 15 minutes.  Disclaimer: This note was dictated with voice recognition software. Similar sounding words can inadvertently be transcribed and may not be corrected upon  review.

## 2017-06-18 ENCOUNTER — Other Ambulatory Visit: Payer: Managed Care, Other (non HMO)

## 2017-06-25 ENCOUNTER — Other Ambulatory Visit: Payer: Managed Care, Other (non HMO)

## 2017-07-02 ENCOUNTER — Inpatient Hospital Stay: Payer: Managed Care, Other (non HMO) | Attending: Internal Medicine

## 2017-07-02 ENCOUNTER — Inpatient Hospital Stay (HOSPITAL_BASED_OUTPATIENT_CLINIC_OR_DEPARTMENT_OTHER): Payer: Managed Care, Other (non HMO) | Admitting: Internal Medicine

## 2017-07-02 ENCOUNTER — Telehealth: Payer: Self-pay | Admitting: Internal Medicine

## 2017-07-02 ENCOUNTER — Encounter: Payer: Self-pay | Admitting: Internal Medicine

## 2017-07-02 ENCOUNTER — Inpatient Hospital Stay: Payer: Managed Care, Other (non HMO)

## 2017-07-02 DIAGNOSIS — Z9221 Personal history of antineoplastic chemotherapy: Secondary | ICD-10-CM | POA: Insufficient documentation

## 2017-07-02 DIAGNOSIS — N189 Chronic kidney disease, unspecified: Secondary | ICD-10-CM | POA: Diagnosis not present

## 2017-07-02 DIAGNOSIS — N400 Enlarged prostate without lower urinary tract symptoms: Secondary | ICD-10-CM | POA: Insufficient documentation

## 2017-07-02 DIAGNOSIS — E7800 Pure hypercholesterolemia, unspecified: Secondary | ICD-10-CM | POA: Insufficient documentation

## 2017-07-02 DIAGNOSIS — I4892 Unspecified atrial flutter: Secondary | ICD-10-CM | POA: Insufficient documentation

## 2017-07-02 DIAGNOSIS — C3491 Malignant neoplasm of unspecified part of right bronchus or lung: Secondary | ICD-10-CM

## 2017-07-02 DIAGNOSIS — Z79899 Other long term (current) drug therapy: Secondary | ICD-10-CM

## 2017-07-02 DIAGNOSIS — F17210 Nicotine dependence, cigarettes, uncomplicated: Secondary | ICD-10-CM | POA: Insufficient documentation

## 2017-07-02 DIAGNOSIS — Z5112 Encounter for antineoplastic immunotherapy: Secondary | ICD-10-CM | POA: Insufficient documentation

## 2017-07-02 DIAGNOSIS — Z87442 Personal history of urinary calculi: Secondary | ICD-10-CM

## 2017-07-02 DIAGNOSIS — C3411 Malignant neoplasm of upper lobe, right bronchus or lung: Secondary | ICD-10-CM | POA: Diagnosis not present

## 2017-07-02 DIAGNOSIS — M1990 Unspecified osteoarthritis, unspecified site: Secondary | ICD-10-CM | POA: Diagnosis not present

## 2017-07-02 DIAGNOSIS — R5382 Chronic fatigue, unspecified: Secondary | ICD-10-CM

## 2017-07-02 DIAGNOSIS — I129 Hypertensive chronic kidney disease with stage 1 through stage 4 chronic kidney disease, or unspecified chronic kidney disease: Secondary | ICD-10-CM | POA: Diagnosis not present

## 2017-07-02 DIAGNOSIS — J449 Chronic obstructive pulmonary disease, unspecified: Secondary | ICD-10-CM | POA: Insufficient documentation

## 2017-07-02 DIAGNOSIS — C3490 Malignant neoplasm of unspecified part of unspecified bronchus or lung: Secondary | ICD-10-CM

## 2017-07-02 DIAGNOSIS — Z85828 Personal history of other malignant neoplasm of skin: Secondary | ICD-10-CM | POA: Insufficient documentation

## 2017-07-02 DIAGNOSIS — C7951 Secondary malignant neoplasm of bone: Secondary | ICD-10-CM | POA: Insufficient documentation

## 2017-07-02 LAB — CBC WITH DIFFERENTIAL/PLATELET
Basophils Absolute: 0.1 10*3/uL (ref 0.0–0.1)
Basophils Relative: 1 %
EOS ABS: 0.2 10*3/uL (ref 0.0–0.5)
EOS PCT: 2 %
HCT: 40.7 % (ref 38.4–49.9)
Hemoglobin: 13.4 g/dL (ref 13.0–17.1)
Lymphocytes Relative: 21 %
Lymphs Abs: 1.5 10*3/uL (ref 0.9–3.3)
MCH: 30 pg (ref 27.2–33.4)
MCHC: 32.9 g/dL (ref 32.0–36.0)
MCV: 91.3 fL (ref 79.3–98.0)
MONO ABS: 0.7 10*3/uL (ref 0.1–0.9)
MONOS PCT: 10 %
NEUTROS PCT: 66 %
Neutro Abs: 4.7 10*3/uL (ref 1.5–6.5)
PLATELETS: 156 10*3/uL (ref 140–400)
RBC: 4.46 MIL/uL (ref 4.20–5.82)
RDW: 14.5 % (ref 11.0–14.6)
WBC: 7.1 10*3/uL (ref 4.0–10.3)

## 2017-07-02 LAB — COMPREHENSIVE METABOLIC PANEL
ALK PHOS: 70 U/L (ref 40–150)
ALT: 30 U/L (ref 0–55)
AST: 21 U/L (ref 5–34)
Albumin: 3.4 g/dL — ABNORMAL LOW (ref 3.5–5.0)
Anion gap: 7 (ref 3–11)
BUN: 12 mg/dL (ref 7–26)
CALCIUM: 9.3 mg/dL (ref 8.4–10.4)
CHLORIDE: 104 mmol/L (ref 98–109)
CO2: 27 mmol/L (ref 22–29)
CREATININE: 0.88 mg/dL (ref 0.70–1.30)
GFR calc non Af Amer: >60 mL/min (ref >60)
GLUCOSE: 120 mg/dL (ref 70–140)
Potassium: 3.8 mmol/L (ref 3.5–5.1)
Sodium: 138 mmol/L (ref 136–145)
Total Bilirubin: 0.6 mg/dL (ref 0.2–1.2)
Total Protein: 6.4 g/dL (ref 6.4–8.3)

## 2017-07-02 LAB — TSH: TSH: 2.08 u[IU]/mL (ref 0.320–4.118)

## 2017-07-02 MED ORDER — PEMBROLIZUMAB CHEMO INJECTION 100 MG/4ML
200.0000 mg | Freq: Once | INTRAVENOUS | Status: AC
  Administered 2017-07-02: 200 mg via INTRAVENOUS
  Filled 2017-07-02: qty 8

## 2017-07-02 MED ORDER — SODIUM CHLORIDE 0.9 % IV SOLN
Freq: Once | INTRAVENOUS | Status: AC
  Administered 2017-07-02: 13:00:00 via INTRAVENOUS

## 2017-07-02 NOTE — Telephone Encounter (Signed)
Scheduled appt per 4/2 los- Gave patient AVS and calender per los. - Central radiology to contact patient with ct scan ,.

## 2017-07-02 NOTE — Progress Notes (Signed)
[facility] [phone]   [phone]  OFFICE PROGRESS NOTE  Sheryle Hail, PA-C [address] [place] [address]  DIAGNOSIS: Stage IV (T2a,N3,M1c)non-small cell lung cancer, squamous cell carcinoma presented with right upper lobe lung mass in addition to right hilar and mediastinal adenopathy as well as metastatic disease in the medial right thigh diagnosed in November 2018.  PRIOR THERAPY:  1) Palliative radiotherapy to the right lower extremity mass completed April 09, 2017. 2) Systemic chemotherapy with carboplatin for AUC of 5, paclitaxel 175 mg/M2 and Keytruda 200 mg IV every 3 weeks.  First dose March 12, 2017, status post 4 cycles with partial response.  CURRENT THERAPY: Maintenance treatment with single agent Keytruda 200 mg IV every 3 weeks.  First cycle June 11, 2017.  Status post 1 cycle.  INTERVAL HISTORY: Aaron Sellers 67 y.o. male returns to the clinic today for follow-up visit accompanied by his wife.  The patient is feeling fine today with no specific complaints.  He tolerated the first cycle of maintenance treatment with Keytruda fairly well.  He denied having any chest pain, shortness of breath, cough or hemoptysis.  He denied having any fever or chills.  He has no nausea, vomiting, diarrhea or constipation.  The patient is here today for evaluation before starting cycle #2.  MEDICAL HISTORY: Past Medical History:  Diagnosis Date  . Arthritis   . Atrial flutter ([facility])   . BPH (benign prostatic hypertrophy) with urinary retention   . Cancer ([facility])   . Chronic kidney disease    hx of kidney stones  2011  . COPD (chronic obstructive pulmonary disease) ([facility])    has been smoking since he was 22 ish--  . High cholesterol   . Hypertension    dx around 2011  . Squamous cell carcinoma of right lung (HCC)     ALLERGIES:  is allergic to ciprofloxacin; adhesive [tape]; and codeine.  MEDICATIONS:  Current  Outpatient Medications  Medication Sig Dispense Refill  . acetaminophen (TYLENOL) 500 MG tablet Take 1,000 mg by mouth every 6 (six) hours as needed for moderate pain.    Marland Kitchen acidophilus (RISAQUAD) CAPS capsule Take 1 capsule by mouth daily. 10 capsule 0  . amiodarone (PACERONE) 200 MG tablet Starting 06/13/17, TAKE 200 MG DAILY 90 tablet 2  . atorvastatin (LIPITOR) 10 MG tablet Take 10 mg by mouth every evening.     . Cyanocobalamin (VITAMIN B-12 PO) Take 1 tablet by mouth daily.    . famotidine (PEPCID) 10 MG tablet Take 10 mg by mouth as needed for heartburn or indigestion.    . gabapentin (NEURONTIN) 300 MG capsule Take 1 capsule (300 mg total) by mouth 3 (three) times daily. (Patient taking differently: Take 300 mg by mouth 2 (two) times daily as needed (pain). ) 90 capsule 1  . HYDROcodone-acetaminophen (NORCO/VICODIN) 5-325 MG tablet Take 1 tablet by mouth every 6 (six) hours as needed for moderate pain or severe pain. 30 tablet 0  . Lactobacillus Acid-Pectin (ACIDOPHILUS/PECTIN) CAPS acidophilus 100 million cell-pectin, citrus 10 mg capsule  Take by oral route.    . prochlorperazine (COMPAZINE) 10 MG tablet Take 1 tablet (10 mg total) by mouth every 6 (six) hours as needed for nausea or vomiting. 30 tablet 0  . tamsulosin (FLOMAX) 0.4 MG CAPS capsule Take 0.4 mg by mouth daily.    . traMADol (ULTRAM) 50 MG tablet Take 1-2 tablets (50-100 mg total) by mouth every 6 (six)  hours as needed. (Patient taking differently: Take 50-100 mg by mouth every 6 (six) hours as needed for moderate pain. ) 60 tablet 0  . vitamin B-12 (CYANOCOBALAMIN) 100 MCG tablet Vitamin B12     No current facility-administered medications for this visit.    Facility-Administered Medications Ordered in Other Visits  Medication Dose Route Frequency Provider Last Rate Last Dose  . heparin lock flush 100 unit/mL  500 Units Intracatheter Once PRN Curt Bears, MD      . sodium chloride flush (NS) 0.9 % injection 10 mL  10  mL Intracatheter PRN Curt Bears, MD      . sodium chloride flush (NS) 0.9 % injection 10 mL  10 mL Intracatheter PRN Curt Bears, MD        SURGICAL HISTORY:  Past Surgical History:  Procedure Laterality Date  . HERNIA REPAIR     umbilical hernia  05/1192  . INCISION AND DRAINAGE ABSCESS     "down the middle" of his buttocks  2015  . TOTAL KNEE ARTHROPLASTY Left 10/29/2014   Procedure: TOTAL KNEE ARTHROPLASTY;  Surgeon: Dorna Leitz, MD;  Location: [facility];  Service: Orthopedics;  Laterality: Left;    REVIEW OF SYSTEMS:  A comprehensive review of systems was negative.   PHYSICAL EXAMINATION: General appearance: alert, cooperative and no distress Head: Normocephalic, without obvious abnormality, atraumatic Neck: no adenopathy, no JVD, supple, symmetrical, trachea midline and thyroid not enlarged, symmetric, no tenderness/mass/nodules Lymph nodes: Cervical, supraclavicular, and axillary nodes normal. Resp: clear to auscultation bilaterally Back: symmetric, no curvature. ROM normal. No CVA tenderness. Cardio: regular rate and rhythm, S1, S2 normal, no murmur, click, rub or gallop GI: soft, non-tender; bowel sounds normal; no masses,  no organomegaly Extremities: extremities normal, atraumatic, no cyanosis or edema  ECOG PERFORMANCE STATUS: 1 - Symptomatic but completely ambulatory  Blood pressure 128/66, pulse (!) 54, temperature (!) 97.5 F (36.4 C), temperature source Oral, resp. rate 18, height 6\' 7"  (2.007 m), weight 295 lb 3.2 oz (133.9 kg), SpO2 98 %.  LABORATORY DATA: Lab Results  Component Value Date   WBC 7.1 07/02/2017   HGB 13.4 07/02/2017   HCT 40.7 07/02/2017   MCV 91.3 07/02/2017   PLT 156 07/02/2017      Chemistry      Component Value Date/Time   NA 138 06/11/2017 1045   NA 137 04/03/2017 1030   K 4.0 06/11/2017 1045   K 4.1 04/03/2017 1030   CL 103 06/11/2017 1045   CO2 27 06/11/2017 1045   CO2 27 04/03/2017 1030   BUN 11 06/11/2017 1045   BUN  15.5 04/03/2017 1030   CREATININE 0.82 06/11/2017 1045   CREATININE 0.8 04/03/2017 1030      Component Value Date/Time   CALCIUM 9.5 06/11/2017 1045   CALCIUM 9.4 04/03/2017 1030   ALKPHOS 94 06/11/2017 1045   ALKPHOS 97 04/03/2017 1030   AST 21 06/11/2017 1045   AST 14 04/03/2017 1030   ALT 31 06/11/2017 1045   ALT 21 04/03/2017 1030   BILITOT 0.5 06/11/2017 1045   BILITOT 0.67 04/03/2017 1030       RADIOGRAPHIC STUDIES: No results found.  ASSESSMENT AND PLAN: This is a very pleasant 67 years old white male with a stage IV non-small cell lung cancer, squamous cell carcinoma.  The patient is currently undergoing systemic chemotherapy with carboplatin, paclitaxel and Keytruda status post 4 cycles with partial response. He is currently undergoing maintenance treatment with single agent Keytruda status post 1  cycle. I recommended for the patient to proceed with cycle #2 today as a scheduled. I would see him back for follow-up visit in 3 weeks for evaluation before starting cycle #3 after repeating CT scan of the chest, abdomen and pelvis for restaging of his disease. The patient was advised to call immediately if he has any concerning symptoms in the interval. The patient voices understanding of current disease status and treatment options and is in agreement with the current care plan. All questions were answered. The patient knows to call the clinic with any problems, questions or concerns. We can certainly see the patient much sooner if necessary. I spent 10 minutes counseling the patient face to face. The total time spent in the appointment was 15 minutes.  Disclaimer: This note was dictated with voice recognition software. Similar sounding words can inadvertently be transcribed and may not be corrected upon review.

## 2017-07-02 NOTE — Patient Instructions (Signed)
[facility] Discharge Instructions for Patients Receiving Chemotherapy  Today you received the following chemotherapy agents: Pembrolizumab.  BELOW ARE SYMPTOMS THAT SHOULD BE REPORTED IMMEDIATELY:  *FEVER GREATER THAN 100.5 F  *CHILLS WITH OR WITHOUT FEVER  NAUSEA AND VOMITING THAT IS NOT CONTROLLED WITH YOUR NAUSEA MEDICATION  *UNUSUAL SHORTNESS OF BREATH  *UNUSUAL BRUISING OR BLEEDING  TENDERNESS IN MOUTH AND THROAT WITH OR WITHOUT PRESENCE OF ULCERS  *URINARY PROBLEMS  *BOWEL PROBLEMS  UNUSUAL RASH Items with * indicate a potential emergency and should be followed up as soon as possible.  Feel free to call the clinic should you have any questions or concerns. The clinic phone number is [phone].  Please show the [facility] at check-in to the Emergency Department and triage nurse.

## 2017-07-04 ENCOUNTER — Encounter: Payer: Self-pay | Admitting: Cardiology

## 2017-07-04 ENCOUNTER — Ambulatory Visit: Payer: Managed Care, Other (non HMO) | Admitting: Cardiology

## 2017-07-04 VITALS — BP 108/62 | HR 56 | Ht >= 80 in | Wt 293.0 lb

## 2017-07-04 DIAGNOSIS — I4892 Unspecified atrial flutter: Secondary | ICD-10-CM

## 2017-07-04 NOTE — Progress Notes (Signed)
Clinical Summary Aaron Sellers is a 67 y.o.male seen today for follow up of the following medical problems.     1. Paroxysmal aflutter - admission 05/2017 with perforated bowel, issues with aflutter with RVR. Similar issues with his heart rates when admitted with diverticulitis previously - failed av nodal agents, started on IV amiodarone and converted to oral amio.  - he had not been on anticoag. When I saw him in 12/2016 he had a partially perforated bowel and newly diagnosed lung mass combined with low CHADS2Vasc score of 1, and thus anticoag was not started at that time. - prior to recent admission, rates had been reasonably been controlled on dilt 300mg  daily.    - no recent palpitations - compliant with meds.   2. Stage IV lung CA - followed by oncology, on chemo currently. From review of onc note 02/27/17 appears treatment is palliative.    Past Medical History:  Diagnosis Date  . Arthritis   . Atrial flutter ([facility])   . BPH (benign prostatic hypertrophy) with urinary retention   . Cancer ([facility])   . Chronic kidney disease    hx of kidney stones  2011  . COPD (chronic obstructive pulmonary disease) ([facility])    has been smoking since he was 22 ish--  . High cholesterol   . Hypertension    dx around 2011  . Squamous cell carcinoma of right lung (HCC)      Allergies  Allergen Reactions  . Ciprofloxacin     HIVES  . Adhesive [Tape] Other (See Comments)    Skin peels  . Codeine Other (See Comments)    Jitters, "skin crawling"     Current Outpatient Medications  Medication Sig Dispense Refill  . acetaminophen (TYLENOL) 500 MG tablet Take 1,000 mg by mouth every 6 (six) hours as needed for moderate pain.    Marland Kitchen acidophilus (RISAQUAD) CAPS capsule Take 1 capsule by mouth daily. (Patient not taking: Reported on 07/02/2017) 10 capsule 0  . amiodarone (PACERONE) 200 MG tablet Starting 06/13/17, TAKE 200 MG DAILY 90 tablet 2  . atorvastatin (LIPITOR) 10 MG tablet Take 10 mg by  mouth every evening.     . Cyanocobalamin (VITAMIN B-12 PO) Take 1 tablet by mouth daily.    . famotidine (PEPCID) 10 MG tablet Take 10 mg by mouth as needed for heartburn or indigestion.    . gabapentin (NEURONTIN) 300 MG capsule Take 1 capsule (300 mg total) by mouth 3 (three) times daily. (Patient taking differently: Take 300 mg by mouth 2 (two) times daily as needed (pain). ) 90 capsule 1  . HYDROcodone-acetaminophen (NORCO/VICODIN) 5-325 MG tablet Take 1 tablet by mouth every 6 (six) hours as needed for moderate pain or severe pain. (Patient not taking: Reported on 07/02/2017) 30 tablet 0  . Lactobacillus Acid-Pectin (ACIDOPHILUS/PECTIN) CAPS acidophilus 100 million cell-pectin, citrus 10 mg capsule  Take by oral route.    . prochlorperazine (COMPAZINE) 10 MG tablet Take 1 tablet (10 mg total) by mouth every 6 (six) hours as needed for nausea or vomiting. (Patient not taking: Reported on 07/02/2017) 30 tablet 0  . tamsulosin (FLOMAX) 0.4 MG CAPS capsule Take 0.4 mg by mouth daily.    . traMADol (ULTRAM) 50 MG tablet Take 1-2 tablets (50-100 mg total) by mouth every 6 (six) hours as needed. (Patient not taking: Reported on 07/02/2017) 60 tablet 0  . vitamin B-12 (CYANOCOBALAMIN) 100 MCG tablet Vitamin B12     No current facility-administered medications  for this visit.    Facility-Administered Medications Ordered in Other Visits  Medication Dose Route Frequency Provider Last Rate Last Dose  . heparin lock flush 100 unit/mL  500 Units Intracatheter Once PRN Curt Bears, MD      . sodium chloride flush (NS) 0.9 % injection 10 mL  10 mL Intracatheter PRN Curt Bears, MD      . sodium chloride flush (NS) 0.9 % injection 10 mL  10 mL Intracatheter PRN Curt Bears, MD         Past Surgical History:  Procedure Laterality Date  . HERNIA REPAIR     umbilical hernia  10/2421  . INCISION AND DRAINAGE ABSCESS     "down the middle" of his buttocks  2015  . TOTAL KNEE ARTHROPLASTY Left  10/29/2014   Procedure: TOTAL KNEE ARTHROPLASTY;  Surgeon: Dorna Leitz, MD;  Location: [facility];  Service: Orthopedics;  Laterality: Left;     Allergies  Allergen Reactions  . Ciprofloxacin     HIVES  . Adhesive [Tape] Other (See Comments)    Skin peels  . Codeine Other (See Comments)    Jitters, "skin crawling"      Family History  Problem Relation Age of Onset  . Hypertension Mother   . CVA Father   . Lung cancer Sister   . Lung cancer Brother   . Hypertension Brother   . Hypertension Sister      Social History Mr. Muramoto reports that he quit smoking about 5 months ago. His smoking use included cigarettes. He has a 40.00 pack-year smoking history. He has quit using smokeless tobacco. His smokeless tobacco use included snuff. Mr. Peterka reports that he does not drink alcohol.   Review of Systems CONSTITUTIONAL: No weight loss, fever, chills, weakness or fatigue.  HEENT: Eyes: No visual loss, blurred vision, double vision or yellow sclerae.No hearing loss, sneezing, congestion, runny nose or sore throat.  SKIN: No rash or itching.  CARDIOVASCULAR: per hpi RESPIRATORY: No shortness of breath, cough or sputum.  GASTROINTESTINAL: No anorexia, nausea, vomiting or diarrhea. No abdominal pain or blood.  GENITOURINARY: No burning on urination, no polyuria NEUROLOGICAL: No headache, dizziness, syncope, paralysis, ataxia, numbness or tingling in the extremities. No change in bowel or bladder control.  MUSCULOSKELETAL: No muscle, back pain, joint pain or stiffness.  LYMPHATICS: No enlarged nodes. No history of splenectomy.  PSYCHIATRIC: No history of depression or anxiety.  ENDOCRINOLOGIC: No reports of sweating, cold or heat intolerance. No polyuria or polydipsia.  Marland Kitchen   Physical Examination   Vitals:   07/04/17 1321 07/04/17 1327  BP: 108/64 108/62  Pulse: (!) 56   SpO2: 96%    Vitals:   07/04/17 1321  Weight: 293 lb (132.9 kg)  Height: 6\' 8"  (2.032 m)    Gen: resting  comfortably, no acute distress HEENT: no scleral icterus, pupils equal round and reactive, no palptable cervical adenopathy,  CV: RRR, no m/r/g, no jvd Resp: Clear to auscultation bilaterally GI: abdomen is soft, non-tender, non-distended, normal bowel sounds, no hepatosplenomegaly MSK: extremities are warm, no edema.  Skin: warm, no rash Neuro:  no focal deficits Psych: appropriate affect     Assessment and Plan  1. Paroxysmal aflutter - recent exacerbations due to admission with diverticulitis and bowel perforation.  - prior to recent admission, had been reasonably well controlled with dilt 300mg  daily. During last admission with bowel perforation failed av nodal agents, started on amio and now on oral amio - doing well. With  his bp's I don't think he would tolerate retarting his dilt 300mg  daily (he had RVR on lower doses of dilt), we will continue amio for now. TSH and liver panel this month normal - no anticoag due to low CHADS2Vasc score of 1 and also recurrent issues with perforated bowel.  - at next visit may consider lowering amio to 100mg  daily, starting low dose diilt back pending bp's.  - EKG in clinic today shows SR.     F/u 3 months  Arnoldo Lenis, M.D.,

## 2017-07-04 NOTE — Patient Instructions (Signed)
Medication Instructions:   Your physician recommends that you continue on your current medications as directed. Please refer to the Current Medication list given to you today.  Labwork:  none  Testing/Procedures:  none  Follow-Up:  Your physician recommends that you schedule a follow-up appointment in: 3 months.  Any Other Special Instructions Will Be Listed Below (If Applicable).  If you need a refill on your cardiac medications before your next appointment, please call your pharmacy. 

## 2017-07-08 ENCOUNTER — Encounter: Payer: Self-pay | Admitting: Cardiology

## 2017-07-08 ENCOUNTER — Telehealth: Payer: Self-pay | Admitting: Internal Medicine

## 2017-07-08 NOTE — Telephone Encounter (Signed)
07/08/17 @ 4:44 pm, spoke to patient and informed Disability paperwork has been successfully faxed to [employer] at [phone] and to [employer] at [phone]. Patient requested to pick up a copy at the [facility] at his next appointment.

## 2017-07-09 ENCOUNTER — Other Ambulatory Visit: Payer: Managed Care, Other (non HMO)

## 2017-07-16 ENCOUNTER — Other Ambulatory Visit: Payer: Managed Care, Other (non HMO)

## 2017-07-19 ENCOUNTER — Ambulatory Visit (HOSPITAL_COMMUNITY): Payer: Managed Care, Other (non HMO)

## 2017-07-22 ENCOUNTER — Ambulatory Visit (HOSPITAL_COMMUNITY)
Admission: RE | Admit: 2017-07-22 | Discharge: 2017-07-22 | Disposition: A | Discharge status: Home / Self Care | Payer: Managed Care, Other (non HMO) | Source: Ambulatory Visit | Attending: Internal Medicine | Admitting: Internal Medicine

## 2017-07-22 ENCOUNTER — Encounter (HOSPITAL_COMMUNITY): Payer: Self-pay

## 2017-07-22 DIAGNOSIS — C3490 Malignant neoplasm of unspecified part of unspecified bronchus or lung: Secondary | ICD-10-CM | POA: Diagnosis present

## 2017-07-22 DIAGNOSIS — I2510 Atherosclerotic heart disease of native coronary artery without angina pectoris: Secondary | ICD-10-CM | POA: Diagnosis not present

## 2017-07-22 DIAGNOSIS — N200 Calculus of kidney: Secondary | ICD-10-CM | POA: Insufficient documentation

## 2017-07-22 DIAGNOSIS — M5136 Other intervertebral disc degeneration, lumbar region: Secondary | ICD-10-CM | POA: Diagnosis not present

## 2017-07-22 DIAGNOSIS — D3501 Benign neoplasm of right adrenal gland: Secondary | ICD-10-CM | POA: Insufficient documentation

## 2017-07-22 DIAGNOSIS — K766 Portal hypertension: Secondary | ICD-10-CM | POA: Diagnosis not present

## 2017-07-22 DIAGNOSIS — I700 Atherosclerosis of aorta: Secondary | ICD-10-CM | POA: Diagnosis not present

## 2017-07-22 DIAGNOSIS — M47816 Spondylosis without myelopathy or radiculopathy, lumbar region: Secondary | ICD-10-CM | POA: Insufficient documentation

## 2017-07-22 DIAGNOSIS — K5730 Diverticulosis of large intestine without perforation or abscess without bleeding: Secondary | ICD-10-CM | POA: Diagnosis not present

## 2017-07-22 MED ORDER — IOPAMIDOL (ISOVUE-300) INJECTION 61%
100.0000 mL | Freq: Once | INTRAVENOUS | Status: AC | PRN
  Administered 2017-07-22: 100 mL via INTRAVENOUS

## 2017-07-23 ENCOUNTER — Telehealth: Payer: Self-pay | Admitting: Internal Medicine

## 2017-07-23 ENCOUNTER — Inpatient Hospital Stay (HOSPITAL_BASED_OUTPATIENT_CLINIC_OR_DEPARTMENT_OTHER): Payer: Managed Care, Other (non HMO) | Admitting: Internal Medicine

## 2017-07-23 ENCOUNTER — Inpatient Hospital Stay: Payer: Managed Care, Other (non HMO)

## 2017-07-23 ENCOUNTER — Encounter: Payer: Self-pay | Admitting: Internal Medicine

## 2017-07-23 VITALS — BP 126/78 | HR 56 | Temp 97.8°F | Resp 18 | Ht >= 80 in | Wt 297.5 lb

## 2017-07-23 DIAGNOSIS — I10 Essential (primary) hypertension: Secondary | ICD-10-CM

## 2017-07-23 DIAGNOSIS — C3411 Malignant neoplasm of upper lobe, right bronchus or lung: Secondary | ICD-10-CM | POA: Diagnosis not present

## 2017-07-23 DIAGNOSIS — E7800 Pure hypercholesterolemia, unspecified: Secondary | ICD-10-CM

## 2017-07-23 DIAGNOSIS — C3491 Malignant neoplasm of unspecified part of right bronchus or lung: Secondary | ICD-10-CM

## 2017-07-23 DIAGNOSIS — J449 Chronic obstructive pulmonary disease, unspecified: Secondary | ICD-10-CM | POA: Diagnosis not present

## 2017-07-23 DIAGNOSIS — Z87442 Personal history of urinary calculi: Secondary | ICD-10-CM

## 2017-07-23 DIAGNOSIS — I4892 Unspecified atrial flutter: Secondary | ICD-10-CM

## 2017-07-23 DIAGNOSIS — I129 Hypertensive chronic kidney disease with stage 1 through stage 4 chronic kidney disease, or unspecified chronic kidney disease: Secondary | ICD-10-CM

## 2017-07-23 DIAGNOSIS — Z9221 Personal history of antineoplastic chemotherapy: Secondary | ICD-10-CM | POA: Diagnosis not present

## 2017-07-23 DIAGNOSIS — Z720 Tobacco use: Secondary | ICD-10-CM

## 2017-07-23 DIAGNOSIS — R5382 Chronic fatigue, unspecified: Secondary | ICD-10-CM

## 2017-07-23 DIAGNOSIS — Z85828 Personal history of other malignant neoplasm of skin: Secondary | ICD-10-CM

## 2017-07-23 DIAGNOSIS — N400 Enlarged prostate without lower urinary tract symptoms: Secondary | ICD-10-CM

## 2017-07-23 DIAGNOSIS — N189 Chronic kidney disease, unspecified: Secondary | ICD-10-CM

## 2017-07-23 DIAGNOSIS — Z5112 Encounter for antineoplastic immunotherapy: Secondary | ICD-10-CM

## 2017-07-23 DIAGNOSIS — C7951 Secondary malignant neoplasm of bone: Secondary | ICD-10-CM | POA: Diagnosis not present

## 2017-07-23 DIAGNOSIS — M1990 Unspecified osteoarthritis, unspecified site: Secondary | ICD-10-CM

## 2017-07-23 DIAGNOSIS — Z79899 Other long term (current) drug therapy: Secondary | ICD-10-CM

## 2017-07-23 DIAGNOSIS — F17210 Nicotine dependence, cigarettes, uncomplicated: Secondary | ICD-10-CM

## 2017-07-23 LAB — CBC WITH DIFFERENTIAL/PLATELET
BASOS ABS: 0 10*3/uL (ref 0.0–0.1)
BASOS PCT: 1 %
EOS ABS: 0.1 10*3/uL (ref 0.0–0.5)
Eosinophils Relative: 2 %
HCT: 42.6 % (ref 38.4–49.9)
Hemoglobin: 14.1 g/dL (ref 13.0–17.1)
Lymphocytes Relative: 22 %
Lymphs Abs: 1.4 10*3/uL (ref 0.9–3.3)
MCH: 30.1 pg (ref 27.2–33.4)
MCHC: 33.1 g/dL (ref 32.0–36.0)
MCV: 90.8 fL (ref 79.3–98.0)
Monocytes Absolute: 0.6 10*3/uL (ref 0.1–0.9)
Monocytes Relative: 9 %
NEUTROS PCT: 66 %
Neutro Abs: 4.3 10*3/uL (ref 1.5–6.5)
PLATELETS: 153 10*3/uL (ref 140–400)
RBC: 4.69 MIL/uL (ref 4.20–5.82)
RDW: 13.6 % (ref 11.0–14.6)
WBC: 6.5 10*3/uL (ref 4.0–10.3)

## 2017-07-23 LAB — COMPREHENSIVE METABOLIC PANEL
ALT: 34 U/L (ref 0–55)
ANION GAP: 10 (ref 3–11)
AST: 21 U/L (ref 5–34)
Albumin: 3.7 g/dL (ref 3.5–5.0)
Alkaline Phosphatase: 69 U/L (ref 40–150)
BILIRUBIN TOTAL: 0.7 mg/dL (ref 0.2–1.2)
BUN: 13 mg/dL (ref 7–26)
CHLORIDE: 105 mmol/L (ref 98–109)
CO2: 24 mmol/L (ref 22–29)
Calcium: 9.3 mg/dL (ref 8.4–10.4)
Creatinine, Ser: 1.02 mg/dL (ref 0.70–1.30)
GFR calc Af Amer: >60 mL/min (ref >60)
Glucose, Bld: 158 mg/dL — ABNORMAL HIGH (ref 70–140)
POTASSIUM: 3.8 mmol/L (ref 3.5–5.1)
Sodium: 139 mmol/L (ref 136–145)
TOTAL PROTEIN: 6.7 g/dL (ref 6.4–8.3)

## 2017-07-23 LAB — TSH: TSH: 1.455 u[IU]/mL (ref 0.320–4.118)

## 2017-07-23 MED ORDER — SODIUM CHLORIDE 0.9 % IV SOLN
Freq: Once | INTRAVENOUS | Status: AC
  Administered 2017-07-23: 12:00:00 via INTRAVENOUS

## 2017-07-23 MED ORDER — PEMBROLIZUMAB CHEMO INJECTION 100 MG/4ML
200.0000 mg | Freq: Once | INTRAVENOUS | Status: AC
  Administered 2017-07-23: 200 mg via INTRAVENOUS
  Filled 2017-07-23: qty 8

## 2017-07-23 NOTE — Patient Instructions (Signed)
[facility] Discharge Instructions for Patients Receiving Chemotherapy  Today you received the following chemotherapy agents: Pembrolizumab.  BELOW ARE SYMPTOMS THAT SHOULD BE REPORTED IMMEDIATELY:  *FEVER GREATER THAN 100.5 F  *CHILLS WITH OR WITHOUT FEVER  NAUSEA AND VOMITING THAT IS NOT CONTROLLED WITH YOUR NAUSEA MEDICATION  *UNUSUAL SHORTNESS OF BREATH  *UNUSUAL BRUISING OR BLEEDING  TENDERNESS IN MOUTH AND THROAT WITH OR WITHOUT PRESENCE OF ULCERS  *URINARY PROBLEMS  *BOWEL PROBLEMS  UNUSUAL RASH Items with * indicate a potential emergency and should be followed up as soon as possible.  Feel free to call the clinic should you have any questions or concerns. The clinic phone number is [phone].  Please show the [facility] at check-in to the Emergency Department and triage nurse.

## 2017-07-23 NOTE — Telephone Encounter (Signed)
Scheduled appt per 4/23 los - patient to get an updated schedule in the treatment area.

## 2017-07-23 NOTE — Progress Notes (Signed)
[facility] [phone]   [phone]  OFFICE PROGRESS NOTE  Sheryle Hail, PA-C [address] [place] [address]  DIAGNOSIS: Stage IV (T2a,N3,M1c)non-small cell lung cancer, squamous cell carcinoma presented with right upper lobe lung mass in addition to right hilar and mediastinal adenopathy as well as metastatic disease in the medial right thigh diagnosed in November 2018.  PRIOR THERAPY:  1) Palliative radiotherapy to the right lower extremity mass completed April 09, 2017. 2) Systemic chemotherapy with carboplatin for AUC of 5, paclitaxel 175 mg/M2 and Keytruda 200 mg IV every 3 weeks.  First dose March 12, 2017, status post 4 cycles with partial response.  CURRENT THERAPY: Maintenance treatment with single agent Keytruda 200 mg IV every 3 weeks.  First cycle June 11, 2017.  Status post 2 cycles.  INTERVAL HISTORY: Aaron Sellers 67 y.o. male returns to the clinic today for follow-up visit accompanied by his wife.  The patient is feeling fine today with no concerning complaints.  He denied having any chest pain, shortness of breath except with exertion with no cough or hemoptysis.  He has no fever or chills.  He has no nausea, vomiting, diarrhea or constipation.  The patient denied having any recent weight loss or night sweats.  He continues to tolerate his current maintenance treatment with Keytruda fairly well.  He had repeat CT scan of the chest, abdomen and pelvis performed recently and is here for evaluation and discussion of his discuss results.  MEDICAL HISTORY: Past Medical History:  Diagnosis Date  . Arthritis   . Atrial flutter ([facility])   . BPH (benign prostatic hypertrophy) with urinary retention   . Cancer ([facility])   . Chronic kidney disease    hx of kidney stones  2011  . COPD (chronic obstructive pulmonary disease) ([facility])    has been smoking since he was 22 ish--  . High cholesterol   . Hypertension    dx around  2011  . Squamous cell carcinoma of right lung ([facility])     ALLERGIES:  is allergic to ciprofloxacin; adhesive [tape]; and codeine.  MEDICATIONS:  Current Outpatient Medications  Medication Sig Dispense Refill  . acetaminophen (TYLENOL) 500 MG tablet Take 1,000 mg by mouth every 6 (six) hours as needed for moderate pain.    Marland Kitchen amiodarone (PACERONE) 200 MG tablet Starting 06/13/17, TAKE 200 MG DAILY 90 tablet 2  . atorvastatin (LIPITOR) 10 MG tablet Take 10 mg by mouth every evening.     . Cyanocobalamin (VITAMIN B-12 PO) Take 1 tablet by mouth daily.    . famotidine (PEPCID) 10 MG tablet Take 10 mg by mouth as needed for heartburn or indigestion.    . gabapentin (NEURONTIN) 300 MG capsule Take 1 capsule (300 mg total) by mouth 3 (three) times daily. (Patient taking differently: Take 300 mg by mouth 2 (two) times daily as needed (pain). ) 90 capsule 1  . loratadine (CLARITIN) 10 MG tablet Take 10 mg by mouth daily.    . tamsulosin (FLOMAX) 0.4 MG CAPS capsule Take 0.4 mg by mouth daily.     No current facility-administered medications for this visit.    Facility-Administered Medications Ordered in Other Visits  Medication Dose Route Frequency Provider Last Rate Last Dose  . heparin lock flush 100 unit/mL  500 Units Intracatheter Once PRN Curt Bears, MD      . sodium chloride flush (NS) 0.9 % injection 10 mL  10 mL Intracatheter PRN Julien Nordmann,  Kevonta Phariss, MD      . sodium chloride flush (NS) 0.9 % injection 10 mL  10 mL Intracatheter PRN Curt Bears, MD        SURGICAL HISTORY:  Past Surgical History:  Procedure Laterality Date  . HERNIA REPAIR     umbilical hernia  07/4625  . INCISION AND DRAINAGE ABSCESS     "down the middle" of his buttocks  2015  . TOTAL KNEE ARTHROPLASTY Left 10/29/2014   Procedure: TOTAL KNEE ARTHROPLASTY;  Surgeon: Dorna Leitz, MD;  Location: [facility];  Service: Orthopedics;  Laterality: Left;    REVIEW OF SYSTEMS:  Constitutional: negative Eyes:  negative Ears, nose, mouth, throat, and face: negative Respiratory: positive for dyspnea on exertion Cardiovascular: negative Gastrointestinal: negative Genitourinary:negative Integument/breast: negative Hematologic/lymphatic: negative Musculoskeletal:negative Neurological: positive for paresthesia Behavioral/Psych: negative Endocrine: negative Allergic/Immunologic: negative   PHYSICAL EXAMINATION: General appearance: alert, cooperative and no distress Head: Normocephalic, without obvious abnormality, atraumatic Neck: no adenopathy, no JVD, supple, symmetrical, trachea midline and thyroid not enlarged, symmetric, no tenderness/mass/nodules Lymph nodes: Cervical, supraclavicular, and axillary nodes normal. Resp: clear to auscultation bilaterally Back: symmetric, no curvature. ROM normal. No CVA tenderness. Cardio: regular rate and rhythm, S1, S2 normal, no murmur, click, rub or gallop GI: soft, non-tender; bowel sounds normal; no masses,  no organomegaly Extremities: extremities normal, atraumatic, no cyanosis or edema Neurologic: Alert and oriented X 3, normal strength and tone. Normal symmetric reflexes. Normal coordination and gait  ECOG PERFORMANCE STATUS: 1 - Symptomatic but completely ambulatory  Blood pressure 126/78, pulse (!) 56, temperature 97.8 F (36.6 C), temperature source Oral, resp. rate 18, height 6\' 8"  (2.032 m), weight 297 lb 8 oz (134.9 kg), SpO2 98 %.  LABORATORY DATA: Lab Results  Component Value Date   WBC 6.5 07/23/2017   HGB 14.1 07/23/2017   HCT 42.6 07/23/2017   MCV 90.8 07/23/2017   PLT 153 07/23/2017      Chemistry      Component Value Date/Time   NA 139 07/23/2017 0940   NA 137 04/03/2017 1030   K 3.8 07/23/2017 0940   K 4.1 04/03/2017 1030   CL 105 07/23/2017 0940   CO2 24 07/23/2017 0940   CO2 27 04/03/2017 1030   BUN 13 07/23/2017 0940   BUN 15.5 04/03/2017 1030   CREATININE 1.02 07/23/2017 0940   CREATININE 0.8 04/03/2017 1030       Component Value Date/Time   CALCIUM 9.3 07/23/2017 0940   CALCIUM 9.4 04/03/2017 1030   ALKPHOS 69 07/23/2017 0940   ALKPHOS 97 04/03/2017 1030   AST 21 07/23/2017 0940   AST 14 04/03/2017 1030   ALT 34 07/23/2017 0940   ALT 21 04/03/2017 1030   BILITOT 0.7 07/23/2017 0940   BILITOT 0.67 04/03/2017 1030       RADIOGRAPHIC STUDIES: Ct Chest W Contrast  Result Date: 07/22/2017 CLINICAL DATA:  Restaging of non-small cell right upper lobe lung cancer. Radiation therapy completed. EXAM: CT CHEST, ABDOMEN, AND PELVIS WITH CONTRAST TECHNIQUE: Multidetector CT imaging of the chest, abdomen and pelvis was performed following the standard protocol during bolus administration of intravenous contrast. CONTRAST:  143mL ISOVUE-300 IOPAMIDOL (ISOVUE-300) INJECTION 61% COMPARISON:  05/28/2017 and 05/20/2017 FINDINGS: CT CHEST FINDINGS Cardiovascular: Coronary, aortic arch, and branch vessel atherosclerotic vascular disease. Mediastinum/Nodes: Right eccentric subcarinal lymph node 1.1 cm in short axis on image 31/2, previously 1.3 cm. Left axillary lymph node 0.9 cm in short axis on image 18/2, formerly 0.6 cm. No current pathologic thoracic  adenopathy. Lungs/Pleura: The right upper lobe mass measures 1.9 by 1.8 cm on image 48/4 (formerly 2.1 by 2.0 cm when measured in the same fashion) and demonstrates internal cavitation. The border thickness of this lesion is mildly reduced compared to prior, and there is bandlike marginal density extending to the pleural margin as before. Overall the appearance seems mildly improved. A 3 mm right upper lobe nodule is present anteriorly on image 43/4 and appear stable. A separate 3 mm right upper lobe nodule on image 48/4 previously measured 3 by 4 mm. There is mild airway thickening along with some mucus in the bronchus intermedius and left mainstem bronchus. 3 by 4 mm left upper lobe nodule on image 64/4 appears stable. Musculoskeletal: Thoracic spondylosis. CT ABDOMEN  PELVIS FINDINGS Hepatobiliary: Unremarkable Pancreas: Unremarkable Spleen: Unremarkable Adrenals/Urinary Tract: Stable right adrenal mass compatible with adenoma based on low-density on prior noncontrast workup. Hypodense 2.4 by 1.8 by 1.9 cm lesion favoring cyst. There is a cluster of 2 adjacent right UVJ stones, one measuring 4 mm and one measuring 3 mm, without proximal hydroureter or hydronephrosis. Nonobstructive 2 mm right kidney upper pole calculus. Nonobstructive 1-2 mm left kidney upper pole calculus. Stomach/Bowel: Sigmoid diverticulosis. Equivocal appearance for low-grade active diverticulitis in the right lower quadrant on image 18/2. Appendix normal. Vascular/Lymphatic: Aortoiliac atherosclerotic vascular disease. Peripancreatic lymph node 1.2 cm in short axis on image 66/2, formerly 1.4 cm on 05/28/2017. Portacaval lymph node 1.6 cm in short axis on image 70/2, formerly the same. Infrarenal abdominal aortic ectasia at 2.9 cm diameter, image 90/2. 0.8 cm left external iliac lymph node, image 122/2. Vascular shunting between the splenic vein along the left omentum and retroperitoneum to the left renal vein. This may be an indicator of portal venous hypertension. Reproductive: Unremarkable Other: No supplemental non-categorized findings. Musculoskeletal: The left hamstring tendon demonstrates hypodensity, query partial tearing. Spondylosis and degenerative disc disease causing suspected right foraminal impingement at L3-4, L4-5, and L5-S1, and central narrowing of the thecal sac at L4-5. IMPRESSION: 1. Further reduction in size of the cavitary right upper lobe lung nodule. 2. Several tiny upper lobe pulmonary nodules are stable in size. 3. Mild upper abdominal adenopathy, with reduction in size of a peripancreatic lymph node which remains mildly enlarged, and a stable mildly prominent portacaval lymph node. 4. There are 2 adjacent clustered right UVJ stones which do not appear obstructive. Additional  nonobstructive bilateral renal calculi are noted. 5. Sigmoid diverticulosis with equivocal low-grade active diverticulitis in the right lower quadrant, but no extraluminal gas. Appendix normal. 6. Portal venous hypertension with splenorenal shunting. 7. Other imaging findings of potential clinical significance: Aortic Atherosclerosis (ICD10-I70.0). Coronary atherosclerosis. Airway thickening is present, suggesting bronchitis or reactive airways disease. Right adrenal adenoma. Partial tearing of the left hamstring tendon. Lower lumbar multilevel impingement due to spondylosis and degenerative disc disease. Electronically Signed   By: Van Clines M.D.   On: 07/22/2017 15:38   Ct Abdomen Pelvis W Contrast  Result Date: 07/22/2017 CLINICAL DATA:  Restaging of non-small cell right upper lobe lung cancer. Radiation therapy completed. EXAM: CT CHEST, ABDOMEN, AND PELVIS WITH CONTRAST TECHNIQUE: Multidetector CT imaging of the chest, abdomen and pelvis was performed following the standard protocol during bolus administration of intravenous contrast. CONTRAST:  149mL ISOVUE-300 IOPAMIDOL (ISOVUE-300) INJECTION 61% COMPARISON:  05/28/2017 and 05/20/2017 FINDINGS: CT CHEST FINDINGS Cardiovascular: Coronary, aortic arch, and branch vessel atherosclerotic vascular disease. Mediastinum/Nodes: Right eccentric subcarinal lymph node 1.1 cm in short axis on image 31/2, previously 1.3  cm. Left axillary lymph node 0.9 cm in short axis on image 18/2, formerly 0.6 cm. No current pathologic thoracic adenopathy. Lungs/Pleura: The right upper lobe mass measures 1.9 by 1.8 cm on image 48/4 (formerly 2.1 by 2.0 cm when measured in the same fashion) and demonstrates internal cavitation. The border thickness of this lesion is mildly reduced compared to prior, and there is bandlike marginal density extending to the pleural margin as before. Overall the appearance seems mildly improved. A 3 mm right upper lobe nodule is present  anteriorly on image 43/4 and appear stable. A separate 3 mm right upper lobe nodule on image 48/4 previously measured 3 by 4 mm. There is mild airway thickening along with some mucus in the bronchus intermedius and left mainstem bronchus. 3 by 4 mm left upper lobe nodule on image 64/4 appears stable. Musculoskeletal: Thoracic spondylosis. CT ABDOMEN PELVIS FINDINGS Hepatobiliary: Unremarkable Pancreas: Unremarkable Spleen: Unremarkable Adrenals/Urinary Tract: Stable right adrenal mass compatible with adenoma based on low-density on prior noncontrast workup. Hypodense 2.4 by 1.8 by 1.9 cm lesion favoring cyst. There is a cluster of 2 adjacent right UVJ stones, one measuring 4 mm and one measuring 3 mm, without proximal hydroureter or hydronephrosis. Nonobstructive 2 mm right kidney upper pole calculus. Nonobstructive 1-2 mm left kidney upper pole calculus. Stomach/Bowel: Sigmoid diverticulosis. Equivocal appearance for low-grade active diverticulitis in the right lower quadrant on image 18/2. Appendix normal. Vascular/Lymphatic: Aortoiliac atherosclerotic vascular disease. Peripancreatic lymph node 1.2 cm in short axis on image 66/2, formerly 1.4 cm on 05/28/2017. Portacaval lymph node 1.6 cm in short axis on image 70/2, formerly the same. Infrarenal abdominal aortic ectasia at 2.9 cm diameter, image 90/2. 0.8 cm left external iliac lymph node, image 122/2. Vascular shunting between the splenic vein along the left omentum and retroperitoneum to the left renal vein. This may be an indicator of portal venous hypertension. Reproductive: Unremarkable Other: No supplemental non-categorized findings. Musculoskeletal: The left hamstring tendon demonstrates hypodensity, query partial tearing. Spondylosis and degenerative disc disease causing suspected right foraminal impingement at L3-4, L4-5, and L5-S1, and central narrowing of the thecal sac at L4-5. IMPRESSION: 1. Further reduction in size of the cavitary right upper lobe  lung nodule. 2. Several tiny upper lobe pulmonary nodules are stable in size. 3. Mild upper abdominal adenopathy, with reduction in size of a peripancreatic lymph node which remains mildly enlarged, and a stable mildly prominent portacaval lymph node. 4. There are 2 adjacent clustered right UVJ stones which do not appear obstructive. Additional nonobstructive bilateral renal calculi are noted. 5. Sigmoid diverticulosis with equivocal low-grade active diverticulitis in the right lower quadrant, but no extraluminal gas. Appendix normal. 6. Portal venous hypertension with splenorenal shunting. 7. Other imaging findings of potential clinical significance: Aortic Atherosclerosis (ICD10-I70.0). Coronary atherosclerosis. Airway thickening is present, suggesting bronchitis or reactive airways disease. Right adrenal adenoma. Partial tearing of the left hamstring tendon. Lower lumbar multilevel impingement due to spondylosis and degenerative disc disease. Electronically Signed   By: Van Clines M.D.   On: 07/22/2017 15:38    ASSESSMENT AND PLAN: This is a very pleasant 67 years old white male with a stage IV non-small cell lung cancer, squamous cell carcinoma.  The patient is currently undergoing systemic chemotherapy with carboplatin, paclitaxel and Keytruda status post 4 cycles with partial response. He is currently undergoing maintenance treatment with single agent Keytruda status post 2 cycles. The patient continues to tolerate this treatment well with no concerning complaints. He had repeat CT scan of  the chest, abdomen and pelvis performed recently.  I personally and independently reviewed the scans and discussed the results with the patient and his wife. His scan showed no concerning findings for disease progression. I recommended for the patient to continue his current treatment with maintenance Keytruda and he will proceed with cycle #3 today. The patient will come back for follow-up visit in 3 weeks  for evaluation before the next cycle of his treatment. He was advised to call immediately if he has any concerning symptoms in the interval. The patient voices understanding of current disease status and treatment options and is in agreement with the current care plan. All questions were answered. The patient knows to call the clinic with any problems, questions or concerns. We can certainly see the patient much sooner if necessary.  Disclaimer: This note was dictated with voice recognition software. Similar sounding words can inadvertently be transcribed and may not be corrected upon review.

## 2017-07-30 ENCOUNTER — Other Ambulatory Visit: Payer: Managed Care, Other (non HMO)

## 2017-07-31 NOTE — Progress Notes (Signed)
Successfully faxed Disability paperwork to [facility] at [phone]. Mailed a copy to patient address on file.

## 2017-08-06 ENCOUNTER — Other Ambulatory Visit: Payer: Managed Care, Other (non HMO)

## 2017-08-13 ENCOUNTER — Telehealth: Payer: Self-pay | Admitting: Internal Medicine

## 2017-08-13 ENCOUNTER — Encounter: Payer: Self-pay | Admitting: Internal Medicine

## 2017-08-13 ENCOUNTER — Inpatient Hospital Stay (HOSPITAL_BASED_OUTPATIENT_CLINIC_OR_DEPARTMENT_OTHER): Payer: Managed Care, Other (non HMO) | Admitting: Internal Medicine

## 2017-08-13 ENCOUNTER — Inpatient Hospital Stay: Payer: Managed Care, Other (non HMO) | Attending: Oncology

## 2017-08-13 ENCOUNTER — Inpatient Hospital Stay: Payer: Managed Care, Other (non HMO)

## 2017-08-13 VITALS — BP 139/74 | HR 59 | Temp 97.6°F | Resp 18 | Ht >= 80 in | Wt 302.8 lb

## 2017-08-13 DIAGNOSIS — Z5112 Encounter for antineoplastic immunotherapy: Secondary | ICD-10-CM | POA: Diagnosis not present

## 2017-08-13 DIAGNOSIS — D3501 Benign neoplasm of right adrenal gland: Secondary | ICD-10-CM | POA: Insufficient documentation

## 2017-08-13 DIAGNOSIS — M5136 Other intervertebral disc degeneration, lumbar region: Secondary | ICD-10-CM | POA: Diagnosis not present

## 2017-08-13 DIAGNOSIS — Z5111 Encounter for antineoplastic chemotherapy: Secondary | ICD-10-CM | POA: Insufficient documentation

## 2017-08-13 DIAGNOSIS — K766 Portal hypertension: Secondary | ICD-10-CM | POA: Diagnosis not present

## 2017-08-13 DIAGNOSIS — K5730 Diverticulosis of large intestine without perforation or abscess without bleeding: Secondary | ICD-10-CM | POA: Insufficient documentation

## 2017-08-13 DIAGNOSIS — Z881 Allergy status to other antibiotic agents status: Secondary | ICD-10-CM | POA: Insufficient documentation

## 2017-08-13 DIAGNOSIS — I129 Hypertensive chronic kidney disease with stage 1 through stage 4 chronic kidney disease, or unspecified chronic kidney disease: Secondary | ICD-10-CM | POA: Diagnosis not present

## 2017-08-13 DIAGNOSIS — C3411 Malignant neoplasm of upper lobe, right bronchus or lung: Secondary | ICD-10-CM | POA: Insufficient documentation

## 2017-08-13 DIAGNOSIS — C3491 Malignant neoplasm of unspecified part of right bronchus or lung: Secondary | ICD-10-CM | POA: Diagnosis not present

## 2017-08-13 DIAGNOSIS — R590 Localized enlarged lymph nodes: Secondary | ICD-10-CM | POA: Insufficient documentation

## 2017-08-13 DIAGNOSIS — E7800 Pure hypercholesterolemia, unspecified: Secondary | ICD-10-CM | POA: Insufficient documentation

## 2017-08-13 DIAGNOSIS — N189 Chronic kidney disease, unspecified: Secondary | ICD-10-CM | POA: Insufficient documentation

## 2017-08-13 DIAGNOSIS — I4892 Unspecified atrial flutter: Secondary | ICD-10-CM | POA: Insufficient documentation

## 2017-08-13 DIAGNOSIS — Z885 Allergy status to narcotic agent status: Secondary | ICD-10-CM | POA: Insufficient documentation

## 2017-08-13 DIAGNOSIS — C7989 Secondary malignant neoplasm of other specified sites: Secondary | ICD-10-CM | POA: Insufficient documentation

## 2017-08-13 DIAGNOSIS — N200 Calculus of kidney: Secondary | ICD-10-CM | POA: Diagnosis not present

## 2017-08-13 DIAGNOSIS — Z79899 Other long term (current) drug therapy: Secondary | ICD-10-CM | POA: Insufficient documentation

## 2017-08-13 DIAGNOSIS — R5382 Chronic fatigue, unspecified: Secondary | ICD-10-CM

## 2017-08-13 DIAGNOSIS — Z923 Personal history of irradiation: Secondary | ICD-10-CM | POA: Insufficient documentation

## 2017-08-13 DIAGNOSIS — Z888 Allergy status to other drugs, medicaments and biological substances status: Secondary | ICD-10-CM | POA: Insufficient documentation

## 2017-08-13 DIAGNOSIS — J449 Chronic obstructive pulmonary disease, unspecified: Secondary | ICD-10-CM | POA: Insufficient documentation

## 2017-08-13 LAB — CBC WITH DIFFERENTIAL/PLATELET
BASOS ABS: 0 10*3/uL (ref 0.0–0.1)
BASOS PCT: 1 %
Eosinophils Absolute: 0.1 10*3/uL (ref 0.0–0.5)
Eosinophils Relative: 2 %
HCT: 44.5 % (ref 38.4–49.9)
Hemoglobin: 14.8 g/dL (ref 13.0–17.1)
LYMPHS PCT: 21 %
Lymphs Abs: 1.5 10*3/uL (ref 0.9–3.3)
MCH: 29.9 pg (ref 27.2–33.4)
MCHC: 33.3 g/dL (ref 32.0–36.0)
MCV: 89.9 fL (ref 79.3–98.0)
MONO ABS: 0.8 10*3/uL (ref 0.1–0.9)
Monocytes Relative: 11 %
NEUTROS ABS: 4.6 10*3/uL (ref 1.5–6.5)
Neutrophils Relative %: 65 %
PLATELETS: 175 10*3/uL (ref 140–400)
RBC: 4.95 MIL/uL (ref 4.20–5.82)
RDW: 13.3 % (ref 11.0–14.6)
WBC: 7 10*3/uL (ref 4.0–10.3)

## 2017-08-13 LAB — TSH: TSH: 1.099 u[IU]/mL (ref 0.320–4.118)

## 2017-08-13 LAB — COMPREHENSIVE METABOLIC PANEL
ALK PHOS: 71 U/L (ref 40–150)
ALT: 42 U/L (ref 0–55)
ANION GAP: 8 (ref 3–11)
AST: 29 U/L (ref 5–34)
Albumin: 3.9 g/dL (ref 3.5–5.0)
BILIRUBIN TOTAL: 0.7 mg/dL (ref 0.2–1.2)
BUN: 15 mg/dL (ref 7–26)
CALCIUM: 9.3 mg/dL (ref 8.4–10.4)
CO2: 27 mmol/L (ref 22–29)
Chloride: 104 mmol/L (ref 98–109)
Creatinine, Ser: 1.03 mg/dL (ref 0.70–1.30)
GLUCOSE: 131 mg/dL (ref 70–140)
POTASSIUM: 4 mmol/L (ref 3.5–5.1)
Sodium: 139 mmol/L (ref 136–145)
TOTAL PROTEIN: 7 g/dL (ref 6.4–8.3)

## 2017-08-13 MED ORDER — SODIUM CHLORIDE 0.9 % IV SOLN
Freq: Once | INTRAVENOUS | Status: AC
  Administered 2017-08-13: 14:00:00 via INTRAVENOUS

## 2017-08-13 MED ORDER — SODIUM CHLORIDE 0.9 % IV SOLN
200.0000 mg | Freq: Once | INTRAVENOUS | Status: AC
  Administered 2017-08-13: 200 mg via INTRAVENOUS
  Filled 2017-08-13: qty 8

## 2017-08-13 NOTE — Telephone Encounter (Signed)
Scheduled appt per 5/14 los - Gave patient AVS and calender per los.

## 2017-08-13 NOTE — Patient Instructions (Signed)
[facility] Discharge Instructions for Patients Receiving Chemotherapy  Today you received the following chemotherapy agents: Pembrolizumab.  BELOW ARE SYMPTOMS THAT SHOULD BE REPORTED IMMEDIATELY:  *FEVER GREATER THAN 100.5 F  *CHILLS WITH OR WITHOUT FEVER  NAUSEA AND VOMITING THAT IS NOT CONTROLLED WITH YOUR NAUSEA MEDICATION  *UNUSUAL SHORTNESS OF BREATH  *UNUSUAL BRUISING OR BLEEDING  TENDERNESS IN MOUTH AND THROAT WITH OR WITHOUT PRESENCE OF ULCERS  *URINARY PROBLEMS  *BOWEL PROBLEMS  UNUSUAL RASH Items with * indicate a potential emergency and should be followed up as soon as possible.  Feel free to call the clinic should you have any questions or concerns. The clinic phone number is [phone].  Please show the [facility] at check-in to the Emergency Department and triage nurse.

## 2017-08-13 NOTE — Progress Notes (Signed)
[place] [phone]   [phone]  OFFICE PROGRESS NOTE  Sheryle Hail, PA-C [address] [place] [address]  DIAGNOSIS: Stage IV (T2a,N3,M1c)non-small cell lung cancer, squamous cell carcinoma presented with right upper lobe lung mass in addition to right hilar and mediastinal adenopathy as well as metastatic disease in the medial right thigh diagnosed in November 2018.  PRIOR THERAPY:  1) Palliative radiotherapy to the right lower extremity mass completed April 09, 2017. 2) Systemic chemotherapy with carboplatin for AUC of 5, paclitaxel 175 mg/M2 and Keytruda 200 mg IV every 3 weeks.  First dose March 12, 2017, status post 4 cycles with partial response.  CURRENT THERAPY: Maintenance treatment with single agent Keytruda 200 mg IV every 3 weeks.  First cycle June 11, 2017.  Status post 3 cycles.  INTERVAL HISTORY: Aaron Sellers 67 y.o. male returns to the clinic today for follow-up visit accompanied by his wife.  The patient continues to tolerate his maintenance treatment with [facility] fairly well.  He denied having any chest pain, shortness of breath, cough or hemoptysis.  He denied having any fever or chills.  He has no nausea, vomiting, diarrhea or constipation.  He has no recent weight loss or night sweats.  He is here today for evaluation before starting cycle #4 of this treatment.  MEDICAL HISTORY: Past Medical History:  Diagnosis Date  . Arthritis   . Atrial flutter ([place])   . BPH (benign prostatic hypertrophy) with urinary retention   . Cancer ([place])   . Chronic kidney disease    hx of kidney stones  2011  . COPD (chronic obstructive pulmonary disease) ([place])    has been smoking since he was 22 ish--  . High cholesterol   . Hypertension    dx around 2011  . Squamous cell carcinoma of right lung (H[facility])     ALLERGIES:  is allergic to ciprofloxacin; adhesive [tape]; and codeine.  MEDICATIONS:  Current Outpatient  Medications  Medication Sig Dispense Refill  . acetaminophen (TYLENOL) 500 MG tablet Take 1,000 mg by mouth every 6 (six) hours as needed for moderate pain.    Marland Kitchen amiodarone (PACERONE) 200 MG tablet Starting 06/13/17, TAKE 200 MG DAILY 90 tablet 2  . atorvastatin (LIPITOR) 10 MG tablet Take 10 mg by mouth every evening.     . Cyanocobalamin (VITAMIN B-12 PO) Take 1 tablet by mouth daily.    . famotidine (PEPCID) 10 MG tablet Take 10 mg by mouth as needed for heartburn or indigestion.    . gabapentin (NEURONTIN) 300 MG capsule Take 1 capsule (300 mg total) by mouth 3 (three) times daily. (Patient taking differently: Take 300 mg by mouth 2 (two) times daily as needed (pain). ) 90 capsule 1  . tamsulosin (FLOMAX) 0.4 MG CAPS capsule Take 0.4 mg by mouth daily.    Marland Kitchen loratadine (CLARITIN) 10 MG tablet Take 10 mg by mouth daily.     No current facility-administered medications for this visit.    Facility-Administered Medications Ordered in Other Visits  Medication Dose Route Frequency Provider Last Rate Last Dose  . heparin lock flush 100 unit/mL  500 Units Intracatheter Once PRN Curt Bears, MD      . sodium chloride flush (NS) 0.9 % injection 10 mL  10 mL Intracatheter PRN Curt Bears, MD      . sodium chloride flush (NS) 0.9 % injection 10 mL  10 mL Intracatheter PRN Curt Bears, MD  SURGICAL HISTORY:  Past Surgical History:  Procedure Laterality Date  . HERNIA REPAIR     umbilical hernia  12/5282  . INCISION AND DRAINAGE ABSCESS     "down the middle" of his buttocks  2015  . TOTAL KNEE ARTHROPLASTY Left 10/29/2014   Procedure: TOTAL KNEE ARTHROPLASTY;  Surgeon: Dorna Leitz, MD;  Location: [place];  Service: Orthopedics;  Laterality: Left;    REVIEW OF SYSTEMS:  A comprehensive review of systems was negative.   PHYSICAL EXAMINATION: General appearance: alert, cooperative and no distress Head: Normocephalic, without obvious abnormality, atraumatic Neck: no adenopathy, no  JVD, supple, symmetrical, trachea midline and thyroid not enlarged, symmetric, no tenderness/mass/nodules Lymph nodes: Cervical, supraclavicular, and axillary nodes normal. Resp: clear to auscultation bilaterally Back: symmetric, no curvature. ROM normal. No CVA tenderness. Cardio: regular rate and rhythm, S1, S2 normal, no murmur, click, rub or gallop GI: soft, non-tender; bowel sounds normal; no masses,  no organomegaly Extremities: extremities normal, atraumatic, no cyanosis or edema  ECOG PERFORMANCE STATUS: 1 - Symptomatic but completely ambulatory  Blood pressure 139/74, pulse (!) 59, temperature 97.6 F (36.4 C), temperature source Oral, resp. rate 18, height 6\' 8"  (2.032 m), weight (!) 302 lb 12.8 oz (137.3 kg), SpO2 96 %.  LABORATORY DATA: Lab Results  Component Value Date   WBC 7.0 08/13/2017   HGB 14.8 08/13/2017   HCT 44.5 08/13/2017   MCV 89.9 08/13/2017   PLT 175 08/13/2017      Chemistry      Component Value Date/Time   NA 139 08/13/2017 1048   NA 137 04/03/2017 1030   K 4.0 08/13/2017 1048   K 4.1 04/03/2017 1030   CL 104 08/13/2017 1048   CO2 27 08/13/2017 1048   CO2 27 04/03/2017 1030   BUN 15 08/13/2017 1048   BUN 15.5 04/03/2017 1030   CREATININE 1.03 08/13/2017 1048   CREATININE 0.8 04/03/2017 1030      Component Value Date/Time   CALCIUM 9.3 08/13/2017 1048   CALCIUM 9.4 04/03/2017 1030   ALKPHOS 71 08/13/2017 1048   ALKPHOS 97 04/03/2017 1030   AST 29 08/13/2017 1048   AST 14 04/03/2017 1030   ALT 42 08/13/2017 1048   ALT 21 04/03/2017 1030   BILITOT 0.7 08/13/2017 1048   BILITOT 0.67 04/03/2017 1030       RADIOGRAPHIC STUDIES: Ct Chest W Contrast  Result Date: 07/22/2017 CLINICAL DATA:  Restaging of non-small cell right upper lobe lung cancer. Radiation therapy completed. EXAM: CT CHEST, ABDOMEN, AND PELVIS WITH CONTRAST TECHNIQUE: Multidetector CT imaging of the chest, abdomen and pelvis was performed following the standard protocol  during bolus administration of intravenous contrast. CONTRAST:  180mL ISOVUE-300 IOPAMIDOL (ISOVUE-300) INJECTION 61% COMPARISON:  05/28/2017 and 05/20/2017 FINDINGS: CT CHEST FINDINGS Cardiovascular: Coronary, aortic arch, and branch vessel atherosclerotic vascular disease. Mediastinum/Nodes: Right eccentric subcarinal lymph node 1.1 cm in short axis on image 31/2, previously 1.3 cm. Left axillary lymph node 0.9 cm in short axis on image 18/2, formerly 0.6 cm. No current pathologic thoracic adenopathy. Lungs/Pleura: The right upper lobe mass measures 1.9 by 1.8 cm on image 48/4 (formerly 2.1 by 2.0 cm when measured in the same fashion) and demonstrates internal cavitation. The border thickness of this lesion is mildly reduced compared to prior, and there is bandlike marginal density extending to the pleural margin as before. Overall the appearance seems mildly improved. A 3 mm right upper lobe nodule is present anteriorly on image 43/4 and appear stable. A  separate 3 mm right upper lobe nodule on image 48/4 previously measured 3 by 4 mm. There is mild airway thickening along with some mucus in the bronchus intermedius and left mainstem bronchus. 3 by 4 mm left upper lobe nodule on image 64/4 appears stable. Musculoskeletal: Thoracic spondylosis. CT ABDOMEN PELVIS FINDINGS Hepatobiliary: Unremarkable Pancreas: Unremarkable Spleen: Unremarkable Adrenals/Urinary Tract: Stable right adrenal mass compatible with adenoma based on low-density on prior noncontrast workup. Hypodense 2.4 by 1.8 by 1.9 cm lesion favoring cyst. There is a cluster of 2 adjacent right UVJ stones, one measuring 4 mm and one measuring 3 mm, without proximal hydroureter or hydronephrosis. Nonobstructive 2 mm right kidney upper pole calculus. Nonobstructive 1-2 mm left kidney upper pole calculus. Stomach/Bowel: Sigmoid diverticulosis. Equivocal appearance for low-grade active diverticulitis in the right lower quadrant on image 18/2. Appendix normal.  Vascular/Lymphatic: Aortoiliac atherosclerotic vascular disease. Peripancreatic lymph node 1.2 cm in short axis on image 66/2, formerly 1.4 cm on 05/28/2017. Portacaval lymph node 1.6 cm in short axis on image 70/2, formerly the same. Infrarenal abdominal aortic ectasia at 2.9 cm diameter, image 90/2. 0.8 cm left external iliac lymph node, image 122/2. Vascular shunting between the splenic vein along the left omentum and retroperitoneum to the left renal vein. This may be an indicator of portal venous hypertension. Reproductive: Unremarkable Other: No supplemental non-categorized findings. Musculoskeletal: The left hamstring tendon demonstrates hypodensity, query partial tearing. Spondylosis and degenerative disc disease causing suspected right foraminal impingement at L3-4, L4-5, and L5-S1, and central narrowing of the thecal sac at L4-5. IMPRESSION: 1. Further reduction in size of the cavitary right upper lobe lung nodule. 2. Several tiny upper lobe pulmonary nodules are stable in size. 3. Mild upper abdominal adenopathy, with reduction in size of a peripancreatic lymph node which remains mildly enlarged, and a stable mildly prominent portacaval lymph node. 4. There are 2 adjacent clustered right UVJ stones which do not appear obstructive. Additional nonobstructive bilateral renal calculi are noted. 5. Sigmoid diverticulosis with equivocal low-grade active diverticulitis in the right lower quadrant, but no extraluminal gas. Appendix normal. 6. Portal venous hypertension with splenorenal shunting. 7. Other imaging findings of potential clinical significance: Aortic Atherosclerosis (ICD10-I70.0). Coronary atherosclerosis. Airway thickening is present, suggesting bronchitis or reactive airways disease. Right adrenal adenoma. Partial tearing of the left hamstring tendon. Lower lumbar multilevel impingement due to spondylosis and degenerative disc disease. Electronically Signed   By: Van Clines M.D.   On:  07/22/2017 15:38   Ct Abdomen Pelvis W Contrast  Result Date: 07/22/2017 CLINICAL DATA:  Restaging of non-small cell right upper lobe lung cancer. Radiation therapy completed. EXAM: CT CHEST, ABDOMEN, AND PELVIS WITH CONTRAST TECHNIQUE: Multidetector CT imaging of the chest, abdomen and pelvis was performed following the standard protocol during bolus administration of intravenous contrast. CONTRAST:  162mL ISOVUE-300 IOPAMIDOL (ISOVUE-300) INJECTION 61% COMPARISON:  05/28/2017 and 05/20/2017 FINDINGS: CT CHEST FINDINGS Cardiovascular: Coronary, aortic arch, and branch vessel atherosclerotic vascular disease. Mediastinum/Nodes: Right eccentric subcarinal lymph node 1.1 cm in short axis on image 31/2, previously 1.3 cm. Left axillary lymph node 0.9 cm in short axis on image 18/2, formerly 0.6 cm. No current pathologic thoracic adenopathy. Lungs/Pleura: The right upper lobe mass measures 1.9 by 1.8 cm on image 48/4 (formerly 2.1 by 2.0 cm when measured in the same fashion) and demonstrates internal cavitation. The border thickness of this lesion is mildly reduced compared to prior, and there is bandlike marginal density extending to the pleural margin as before. Overall the appearance  seems mildly improved. A 3 mm right upper lobe nodule is present anteriorly on image 43/4 and appear stable. A separate 3 mm right upper lobe nodule on image 48/4 previously measured 3 by 4 mm. There is mild airway thickening along with some mucus in the bronchus intermedius and left mainstem bronchus. 3 by 4 mm left upper lobe nodule on image 64/4 appears stable. Musculoskeletal: Thoracic spondylosis. CT ABDOMEN PELVIS FINDINGS Hepatobiliary: Unremarkable Pancreas: Unremarkable Spleen: Unremarkable Adrenals/Urinary Tract: Stable right adrenal mass compatible with adenoma based on low-density on prior noncontrast workup. Hypodense 2.4 by 1.8 by 1.9 cm lesion favoring cyst. There is a cluster of 2 adjacent right UVJ stones, one  measuring 4 mm and one measuring 3 mm, without proximal hydroureter or hydronephrosis. Nonobstructive 2 mm right kidney upper pole calculus. Nonobstructive 1-2 mm left kidney upper pole calculus. Stomach/Bowel: Sigmoid diverticulosis. Equivocal appearance for low-grade active diverticulitis in the right lower quadrant on image 18/2. Appendix normal. Vascular/Lymphatic: Aortoiliac atherosclerotic vascular disease. Peripancreatic lymph node 1.2 cm in short axis on image 66/2, formerly 1.4 cm on 05/28/2017. Portacaval lymph node 1.6 cm in short axis on image 70/2, formerly the same. Infrarenal abdominal aortic ectasia at 2.9 cm diameter, image 90/2. 0.8 cm left external iliac lymph node, image 122/2. Vascular shunting between the splenic vein along the left omentum and retroperitoneum to the left renal vein. This may be an indicator of portal venous hypertension. Reproductive: Unremarkable Other: No supplemental non-categorized findings. Musculoskeletal: The left hamstring tendon demonstrates hypodensity, query partial tearing. Spondylosis and degenerative disc disease causing suspected right foraminal impingement at L3-4, L4-5, and L5-S1, and central narrowing of the thecal sac at L4-5. IMPRESSION: 1. Further reduction in size of the cavitary right upper lobe lung nodule. 2. Several tiny upper lobe pulmonary nodules are stable in size. 3. Mild upper abdominal adenopathy, with reduction in size of a peripancreatic lymph node which remains mildly enlarged, and a stable mildly prominent portacaval lymph node. 4. There are 2 adjacent clustered right UVJ stones which do not appear obstructive. Additional nonobstructive bilateral renal calculi are noted. 5. Sigmoid diverticulosis with equivocal low-grade active diverticulitis in the right lower quadrant, but no extraluminal gas. Appendix normal. 6. Portal venous hypertension with splenorenal shunting. 7. Other imaging findings of potential clinical significance: Aortic  Atherosclerosis (ICD10-I70.0). Coronary atherosclerosis. Airway thickening is present, suggesting bronchitis or reactive airways disease. Right adrenal adenoma. Partial tearing of the left hamstring tendon. Lower lumbar multilevel impingement due to spondylosis and degenerative disc disease. Electronically Signed   By: Van Clines M.D.   On: 07/22/2017 15:38    ASSESSMENT AND PLAN: This is a very pleasant 67 years old white male with a stage IV non-small cell lung cancer, squamous cell carcinoma.  The patient is currently undergoing systemic chemotherapy with carboplatin, paclitaxel and Keytruda status post 4 cycles with partial response. He is currently undergoing maintenance treatment with single agent Keytruda status post 3 cycles. The patient continues to tolerate this treatment well.  I recommended for him to proceed with cycle #4 today as a scheduled.  I will see him back for follow-up visit in 3 weeks for evaluation before the next cycle of his treatment. He was advised to call immediately if he has any concerning symptoms in the interval. The patient voices understanding of current disease status and treatment options and is in agreement with the current care plan. All questions were answered. The patient knows to call the clinic with any problems, questions or concerns. We  can certainly see the patient much sooner if necessary.  Disclaimer: This note was dictated with voice recognition software. Similar sounding words can inadvertently be transcribed and may not be corrected upon review.

## 2017-08-27 ENCOUNTER — Telehealth: Payer: Self-pay | Admitting: Medical Oncology

## 2017-08-27 ENCOUNTER — Telehealth: Payer: Self-pay | Admitting: *Deleted

## 2017-08-27 NOTE — Telephone Encounter (Signed)
Needs refill for neurontin.last rx prescribed by XRt. Note forwarded.

## 2017-08-27 NOTE — Telephone Encounter (Signed)
"  Gabapentin was ordered by Dr.Mohamed.  We need a refill.  PCP did not order this medicine."

## 2017-08-28 NOTE — Telephone Encounter (Signed)
He needs to be assessed to determine if he needs this so I will defer to Dr. Julien Nordmann or his pcp. I haven't seen him since February and he is on prn schedule with [place]

## 2017-08-29 NOTE — Telephone Encounter (Signed)
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 He woke up with PN last night in his feet. HX carboplatin , taxol .

## 2017-08-29 NOTE — Telephone Encounter (Signed)
Ok to refill 

## 2017-09-02 ENCOUNTER — Other Ambulatory Visit: Payer: Self-pay | Admitting: Medical Oncology

## 2017-09-02 DIAGNOSIS — G6289 Other specified polyneuropathies: Secondary | ICD-10-CM

## 2017-09-02 MED ORDER — GABAPENTIN 300 MG PO CAPS: 300.0000 mg | ORAL_CAPSULE | Freq: Three times a day (TID) | ORAL | 1 refills | Status: DC

## 2017-09-03 ENCOUNTER — Encounter: Payer: Self-pay | Admitting: Oncology

## 2017-09-03 ENCOUNTER — Inpatient Hospital Stay (HOSPITAL_BASED_OUTPATIENT_CLINIC_OR_DEPARTMENT_OTHER): Payer: Medicare Other | Admitting: Oncology

## 2017-09-03 ENCOUNTER — Other Ambulatory Visit: Payer: Self-pay

## 2017-09-03 ENCOUNTER — Inpatient Hospital Stay: Payer: Medicare Other

## 2017-09-03 ENCOUNTER — Inpatient Hospital Stay: Payer: Medicare Other | Attending: Oncology

## 2017-09-03 VITALS — BP 148/82 | HR 58 | Temp 97.7°F | Resp 17 | Ht >= 80 in | Wt 307.3 lb

## 2017-09-03 DIAGNOSIS — N400 Enlarged prostate without lower urinary tract symptoms: Secondary | ICD-10-CM

## 2017-09-03 DIAGNOSIS — Z9221 Personal history of antineoplastic chemotherapy: Secondary | ICD-10-CM | POA: Diagnosis not present

## 2017-09-03 DIAGNOSIS — Z85828 Personal history of other malignant neoplasm of skin: Secondary | ICD-10-CM | POA: Diagnosis not present

## 2017-09-03 DIAGNOSIS — I4892 Unspecified atrial flutter: Secondary | ICD-10-CM | POA: Insufficient documentation

## 2017-09-03 DIAGNOSIS — M1990 Unspecified osteoarthritis, unspecified site: Secondary | ICD-10-CM | POA: Insufficient documentation

## 2017-09-03 DIAGNOSIS — R600 Localized edema: Secondary | ICD-10-CM | POA: Diagnosis not present

## 2017-09-03 DIAGNOSIS — Z79899 Other long term (current) drug therapy: Secondary | ICD-10-CM | POA: Diagnosis not present

## 2017-09-03 DIAGNOSIS — Z87442 Personal history of urinary calculi: Secondary | ICD-10-CM | POA: Diagnosis not present

## 2017-09-03 DIAGNOSIS — E7800 Pure hypercholesterolemia, unspecified: Secondary | ICD-10-CM | POA: Insufficient documentation

## 2017-09-03 DIAGNOSIS — Z5112 Encounter for antineoplastic immunotherapy: Secondary | ICD-10-CM | POA: Diagnosis not present

## 2017-09-03 DIAGNOSIS — R5383 Other fatigue: Secondary | ICD-10-CM | POA: Diagnosis not present

## 2017-09-03 DIAGNOSIS — J449 Chronic obstructive pulmonary disease, unspecified: Secondary | ICD-10-CM | POA: Insufficient documentation

## 2017-09-03 DIAGNOSIS — C3491 Malignant neoplasm of unspecified part of right bronchus or lung: Secondary | ICD-10-CM

## 2017-09-03 DIAGNOSIS — N189 Chronic kidney disease, unspecified: Secondary | ICD-10-CM

## 2017-09-03 DIAGNOSIS — I129 Hypertensive chronic kidney disease with stage 1 through stage 4 chronic kidney disease, or unspecified chronic kidney disease: Secondary | ICD-10-CM

## 2017-09-03 DIAGNOSIS — C7951 Secondary malignant neoplasm of bone: Secondary | ICD-10-CM | POA: Insufficient documentation

## 2017-09-03 DIAGNOSIS — F17210 Nicotine dependence, cigarettes, uncomplicated: Secondary | ICD-10-CM | POA: Insufficient documentation

## 2017-09-03 DIAGNOSIS — C3411 Malignant neoplasm of upper lobe, right bronchus or lung: Secondary | ICD-10-CM | POA: Insufficient documentation

## 2017-09-03 DIAGNOSIS — R5382 Chronic fatigue, unspecified: Secondary | ICD-10-CM

## 2017-09-03 LAB — CBC WITH DIFFERENTIAL/PLATELET
BASOS ABS: 0 10*3/uL (ref 0.0–0.1)
BASOS PCT: 0 %
Eosinophils Absolute: 0.2 10*3/uL (ref 0.0–0.5)
Eosinophils Relative: 2 %
HEMATOCRIT: 43.5 % (ref 38.4–49.9)
HEMOGLOBIN: 14.1 g/dL (ref 13.0–17.1)
LYMPHS PCT: 23 %
Lymphs Abs: 1.6 10*3/uL (ref 0.9–3.3)
MCH: 28.8 pg (ref 27.2–33.4)
MCHC: 32.4 g/dL (ref 32.0–36.0)
MCV: 88.8 fL (ref 79.3–98.0)
MONO ABS: 0.6 10*3/uL (ref 0.1–0.9)
MONOS PCT: 8 %
NEUTROS ABS: 4.7 10*3/uL (ref 1.5–6.5)
NEUTROS PCT: 67 %
Platelets: 159 10*3/uL (ref 140–400)
RBC: 4.9 MIL/uL (ref 4.20–5.82)
RDW: 13.5 % (ref 11.0–14.6)
WBC: 7.1 10*3/uL (ref 4.0–10.3)

## 2017-09-03 LAB — TSH: TSH: 1.05 u[IU]/mL (ref 0.320–4.118)

## 2017-09-03 LAB — COMPREHENSIVE METABOLIC PANEL
ALBUMIN: 3.8 g/dL (ref 3.5–5.0)
ALT: 35 U/L (ref 0–55)
ANION GAP: 9 (ref 3–11)
AST: 27 U/L (ref 5–34)
Alkaline Phosphatase: 88 U/L (ref 40–150)
BUN: 15 mg/dL (ref 7–26)
CALCIUM: 9.3 mg/dL (ref 8.4–10.4)
CO2: 25 mmol/L (ref 22–29)
Chloride: 105 mmol/L (ref 98–109)
Creatinine, Ser: 1.02 mg/dL (ref 0.70–1.30)
GFR calc non Af Amer: >60 mL/min (ref >60)
GLUCOSE: 129 mg/dL (ref 70–140)
POTASSIUM: 3.6 mmol/L (ref 3.5–5.1)
SODIUM: 139 mmol/L (ref 136–145)
Total Bilirubin: 0.6 mg/dL (ref 0.2–1.2)
Total Protein: 6.8 g/dL (ref 6.4–8.3)

## 2017-09-03 MED ORDER — SODIUM CHLORIDE 0.9 % IV SOLN
200.0000 mg | Freq: Once | INTRAVENOUS | Status: AC
  Administered 2017-09-03: 200 mg via INTRAVENOUS
  Filled 2017-09-03: qty 8

## 2017-09-03 MED ORDER — SODIUM CHLORIDE 0.9 % IV SOLN
Freq: Once | INTRAVENOUS | Status: AC
  Administered 2017-09-03: 13:00:00 via INTRAVENOUS

## 2017-09-03 NOTE — Patient Instructions (Signed)
[facility] Discharge Instructions for Patients Receiving Chemotherapy  Today you received the following chemotherapy agents:  Keytruda.  To help prevent nausea and vomiting after your treatment, we encourage you to take your nausea medication as directed.   If you develop nausea and vomiting that is not controlled by your nausea medication, call the clinic.   BELOW ARE SYMPTOMS THAT SHOULD BE REPORTED IMMEDIATELY:  *FEVER GREATER THAN 100.5 F  *CHILLS WITH OR WITHOUT FEVER  NAUSEA AND VOMITING THAT IS NOT CONTROLLED WITH YOUR NAUSEA MEDICATION  *UNUSUAL SHORTNESS OF BREATH  *UNUSUAL BRUISING OR BLEEDING  TENDERNESS IN MOUTH AND THROAT WITH OR WITHOUT PRESENCE OF ULCERS  *URINARY PROBLEMS  *BOWEL PROBLEMS  UNUSUAL RASH Items with * indicate a potential emergency and should be followed up as soon as possible.  Feel free to call the clinic should you have any questions or concerns. The clinic phone number is [phone].  Please show the CHEMO ALERT CARD at check-in to the Emergency Department and triage nurse.    

## 2017-09-03 NOTE — Assessment & Plan Note (Addendum)
This is a very pleasant 67 year old white male with a stage IV non-small cell lung cancer, squamous cell carcinoma.  The patient is currently undergoing systemic chemotherapy with carboplatin, paclitaxel and Keytruda status post 4 cycles with partial response. He is currently undergoing maintenance treatment with single agent Keytruda status post 4 cycles. The patient continues to tolerate this treatment well.  I recommended for him to proceed with cycle #5 today as a scheduled.   The patient is due for restaging CT scan of the chest, abdomen, pelvis prior to his next visit. He will follow-up in 3 weeks for evaluation prior to cycle #6 of Keytruda and to review his restaging CT scan results.  He was advised to call immediately if he has any concerning symptoms in the interval. The patient voices understanding of current disease status and treatment options and is in agreement with the current care plan. All questions were answered. The patient knows to call the clinic with any problems, questions or concerns. We can certainly see the patient much sooner if necessary.

## 2017-09-03 NOTE — Progress Notes (Signed)
[facility] OFFICE PROGRESS NOTE  Sheryle Hail, PA-C [address] [place] [address]   DIAGNOSIS: Stage IV (T2a,N3,M1c)non-small cell lung cancer, squamous cell carcinoma presented with right upper lobe lung mass in addition to right hilar and mediastinal adenopathy as well as metastatic disease in the medial right thigh diagnosed in November 2018.  PRIOR THERAPY: 1) Palliative radiotherapy to the right lower extremity mass completed April 09, 2017. 2) Systemic chemotherapy with carboplatin for AUC of 5, paclitaxel 175 mg/M2 and Keytruda 200 mg IV every 3 weeks.  First dose March 12, 2017, status post 4 cycles with partial response.  CURRENT THERAPY: Maintenance treatment with single agent Keytruda 200 mg IV every 3 weeks.  First cycle June 11, 2017.  Status post 4 cycles.  INTERVAL HISTORY: Aaron Sellers 67 y.o. male returns for routine follow-up visit accompanied by his wife.  Patient is feeling fine today and has no specific complaints.  The patient denies fevers and chills.  Denies chest pain, shortness breath, cough, hemoptysis.  Denies nausea, vomiting, constipation, diarrhea.  The patient is here for evaluation prior to cycle #5 of Keytruda.  MEDICAL HISTORY: Past Medical History:  Diagnosis Date  . Arthritis   . Atrial flutter ([facility])   . BPH (benign prostatic hypertrophy) with urinary retention   . Cancer ([facility])   . Chronic kidney disease    hx of kidney stones  2011  . COPD (chronic obstructive pulmonary disease) ([place])    has been smoking since he was 22 ish--  . High cholesterol   . Hypertension    dx around 2011  . Squamous cell carcinoma of right lung ([facility])     ALLERGIES:  is allergic to ciprofloxacin; adhesive [tape]; and codeine.  MEDICATIONS:  Current Outpatient Medications  Medication Sig Dispense Refill  . acetaminophen (TYLENOL) 500 MG tablet Take 1,000 mg by mouth every 6 (six) hours as needed for moderate pain.    Marland Kitchen  amiodarone (PACERONE) 200 MG tablet Starting 06/13/17, TAKE 200 MG DAILY 90 tablet 2  . atorvastatin (LIPITOR) 10 MG tablet Take 10 mg by mouth every evening.     . Cyanocobalamin (VITAMIN B-12 PO) Take 1 tablet by mouth daily.    . famotidine (PEPCID) 10 MG tablet Take 10 mg by mouth as needed for heartburn or indigestion.    . gabapentin (NEURONTIN) 300 MG capsule Take 1 capsule (300 mg total) by mouth 3 (three) times daily. 90 capsule 1  . loratadine (CLARITIN) 10 MG tablet Take 10 mg by mouth daily.    . tamsulosin (FLOMAX) 0.4 MG CAPS capsule Take 0.4 mg by mouth daily.     No current facility-administered medications for this visit.    Facility-Administered Medications Ordered in Other Visits  Medication Dose Route Frequency Provider Last Rate Last Dose  . 0.9 %  sodium chloride infusion   Intravenous Once Curt Bears, MD      . heparin lock flush 100 unit/mL  500 Units Intracatheter Once PRN Curt Bears, MD      . pembrolizumab [facility]) 200 mg in sodium chloride 0.9 % 50 mL chemo infusion  200 mg Intravenous Once Curt Bears, MD      . sodium chloride flush (NS) 0.9 % injection 10 mL  10 mL Intracatheter PRN Curt Bears, MD      . sodium chloride flush (NS) 0.9 % injection 10 mL  10 mL Intracatheter PRN Curt Bears, MD        SURGICAL HISTORY:  Past Surgical History:  Procedure Laterality Date  . HERNIA REPAIR     umbilical hernia  07/9561  . INCISION AND DRAINAGE ABSCESS     "down the middle" of his buttocks  2015  . TOTAL KNEE ARTHROPLASTY Left 10/29/2014   Procedure: TOTAL KNEE ARTHROPLASTY;  Surgeon: Dorna Leitz, MD;  Location: [facility];  Service: Orthopedics;  Laterality: Left;    REVIEW OF SYSTEMS:   Review of Systems  Constitutional: Negative for appetite change, chills, fatigue, fever and unexpected weight change.  HENT:   Negative for mouth sores, nosebleeds, sore throat and trouble swallowing.   Eyes: Negative for eye problems and icterus.   Respiratory: Negative for cough, hemoptysis, shortness of breath and wheezing.   Cardiovascular: Negative for chest pain and leg swelling.  Gastrointestinal: Negative for abdominal pain, constipation, diarrhea, nausea and vomiting.  Genitourinary: Negative for bladder incontinence, difficulty urinating, dysuria, frequency and hematuria.   Musculoskeletal: Negative for back pain, gait problem, neck pain and neck stiffness.  Skin: Negative for itching and rash.  Neurological: Negative for dizziness, extremity weakness, gait problem, headaches, light-headedness and seizures.  Hematological: Negative for adenopathy. Does not bruise/bleed easily.  Psychiatric/Behavioral: Negative for confusion, depression and sleep disturbance. The patient is not nervous/anxious.     PHYSICAL EXAMINATION:  Blood pressure (!) 148/82, pulse (!) 58, temperature 97.7 F (36.5 C), temperature source Oral, resp. rate 17, height 6\' 8"  (2.032 m), weight (!) 307 lb 4.8 oz (139.4 kg), SpO2 99 %.  ECOG PERFORMANCE STATUS: 1 - Symptomatic but completely ambulatory  Physical Exam  Constitutional: Oriented to person, place, and time and well-developed, well-nourished, and in no distress. No distress.  HENT:  Head: Normocephalic and atraumatic.  Mouth/Throat: Oropharynx is clear and moist. No oropharyngeal exudate.  Eyes: Conjunctivae are normal. Right eye exhibits no discharge. Left eye exhibits no discharge. No scleral icterus.  Neck: Normal range of motion. Neck supple.  Cardiovascular: Normal rate, regular rhythm, normal heart sounds and intact distal pulses.   Pulmonary/Chest: Effort normal and breath sounds normal. No respiratory distress. No wheezes. No rales.  Abdominal: Soft. Bowel sounds are normal. Exhibits no distension and no mass. There is no tenderness.  Musculoskeletal: Normal range of motion. Exhibits no edema.  Lymphadenopathy:    No cervical adenopathy.  Neurological: Alert and oriented to person,  place, and time. Exhibits normal muscle tone. Gait normal. Coordination normal.  Skin: Skin is warm and dry. No rash noted. Not diaphoretic. No erythema. No pallor.  Psychiatric: Mood, memory and judgment normal.  Vitals reviewed.  LABORATORY DATA: Lab Results  Component Value Date   WBC 7.1 09/03/2017   HGB 14.1 09/03/2017   HCT 43.5 09/03/2017   MCV 88.8 09/03/2017   PLT 159 09/03/2017      Chemistry      Component Value Date/Time   NA 139 09/03/2017 1106   NA 137 04/03/2017 1030   K 3.6 09/03/2017 1106   K 4.1 04/03/2017 1030   CL 105 09/03/2017 1106   CO2 25 09/03/2017 1106   CO2 27 04/03/2017 1030   BUN 15 09/03/2017 1106   BUN 15.5 04/03/2017 1030   CREATININE 1.02 09/03/2017 1106   CREATININE 0.8 04/03/2017 1030      Component Value Date/Time   CALCIUM 9.3 09/03/2017 1106   CALCIUM 9.4 04/03/2017 1030   ALKPHOS 88 09/03/2017 1106   ALKPHOS 97 04/03/2017 1030   AST 27 09/03/2017 1106   AST 14 04/03/2017 1030   ALT 35 09/03/2017 1106  ALT 21 04/03/2017 1030   BILITOT 0.6 09/03/2017 1106   BILITOT 0.67 04/03/2017 1030       RADIOGRAPHIC STUDIES:  No results found.   ASSESSMENT/PLAN:  Stage IV squamous cell carcinoma of right lung [facility]) This is a very pleasant 67 year old white male with a stage IV non-small cell lung cancer, squamous cell carcinoma.  The patient is currently undergoing systemic chemotherapy with carboplatin, paclitaxel and Keytruda status post 4 cycles with partial response. He is currently undergoing maintenance treatment with single agent Keytruda status post 4 cycles. The patient continues to tolerate this treatment well.  I recommended for him to proceed with cycle #5 today as a scheduled.   The patient is due for restaging CT scan of the chest, abdomen, pelvis prior to his next visit. He will follow-up in 3 weeks for evaluation prior to cycle #6 of Keytruda and to review his restaging CT scan results.  He was advised to call  immediately if he has any concerning symptoms in the interval. The patient voices understanding of current disease status and treatment options and is in agreement with the current care plan. All questions were answered. The patient knows to call the clinic with any problems, questions or concerns. We can certainly see the patient much sooner if necessary.   Orders Placed This Encounter  Procedures  . CT ABDOMEN PELVIS W CONTRAST    Standing Status:   Future    Standing Expiration Date:   09/04/2018    Order Specific Question:   If indicated for the ordered procedure, I authorize the administration of contrast media per Radiology protocol    Answer:   Yes    Order Specific Question:   Preferred imaging location?    Answer:   [facility]    Order Specific Question:   Radiology Contrast Protocol - do NOT remove file path    Answer:   [email or web address]    Order Specific Question:   Reason for Exam additional comments    Answer:   Lung cancer. Restaging.  . CT CHEST W CONTRAST    Standing Status:   Future    Standing Expiration Date:   09/04/2018    Order Specific Question:   If indicated for the ordered procedure, I authorize the administration of contrast media per Radiology protocol    Answer:   Yes    Order Specific Question:   Preferred imaging location?    Answer:   [facility]    Order Specific Question:   Radiology Contrast Protocol - do NOT remove file path    Answer:   \\[email or web address]    Order Specific Question:   Reason for Exam additional comments    Answer:   Lung cancer. Restaging.   Mikey Bussing, DNP, AGPCNP-BC, AOCNP 09/03/17

## 2017-09-05 ENCOUNTER — Telehealth: Payer: Self-pay | Admitting: Medical Oncology

## 2017-09-05 NOTE — Telephone Encounter (Signed)
Wife concerned about femur not being CT . Pt does not have any mass, trouble walking or pain in the area. He "just rubs it all the time". I told wife ct  Femur not ordered.

## 2017-09-20 ENCOUNTER — Ambulatory Visit (HOSPITAL_COMMUNITY)
Admission: RE | Admit: 2017-09-20 | Discharge: 2017-09-20 | Disposition: A | Discharge status: Home / Self Care | Payer: Medicare Other | Source: Ambulatory Visit | Attending: Oncology | Admitting: Oncology

## 2017-09-20 DIAGNOSIS — K5730 Diverticulosis of large intestine without perforation or abscess without bleeding: Secondary | ICD-10-CM | POA: Insufficient documentation

## 2017-09-20 DIAGNOSIS — I700 Atherosclerosis of aorta: Secondary | ICD-10-CM | POA: Diagnosis not present

## 2017-09-20 DIAGNOSIS — N201 Calculus of ureter: Secondary | ICD-10-CM | POA: Insufficient documentation

## 2017-09-20 DIAGNOSIS — D3501 Benign neoplasm of right adrenal gland: Secondary | ICD-10-CM | POA: Insufficient documentation

## 2017-09-20 DIAGNOSIS — C3491 Malignant neoplasm of unspecified part of right bronchus or lung: Secondary | ICD-10-CM | POA: Diagnosis not present

## 2017-09-20 DIAGNOSIS — M47816 Spondylosis without myelopathy or radiculopathy, lumbar region: Secondary | ICD-10-CM | POA: Diagnosis not present

## 2017-09-20 DIAGNOSIS — R590 Localized enlarged lymph nodes: Secondary | ICD-10-CM | POA: Diagnosis not present

## 2017-09-20 MED ORDER — IOPAMIDOL (ISOVUE-300) INJECTION 61%
100.0000 mL | Freq: Once | INTRAVENOUS | Status: AC | PRN
  Administered 2017-09-20: 100 mL via INTRAVENOUS

## 2017-09-20 MED ORDER — IOPAMIDOL (ISOVUE-300) INJECTION 61%
INTRAVENOUS | Status: AC
  Filled 2017-09-20: qty 100

## 2017-09-23 ENCOUNTER — Inpatient Hospital Stay: Payer: Medicare Other

## 2017-09-23 ENCOUNTER — Inpatient Hospital Stay (HOSPITAL_BASED_OUTPATIENT_CLINIC_OR_DEPARTMENT_OTHER): Payer: Medicare Other | Admitting: Internal Medicine

## 2017-09-23 ENCOUNTER — Encounter: Payer: Self-pay | Admitting: Internal Medicine

## 2017-09-23 ENCOUNTER — Telehealth: Payer: Self-pay

## 2017-09-23 VITALS — BP 141/82 | HR 56 | Temp 97.7°F | Resp 17 | Ht >= 80 in | Wt 311.6 lb

## 2017-09-23 DIAGNOSIS — Z9221 Personal history of antineoplastic chemotherapy: Secondary | ICD-10-CM

## 2017-09-23 DIAGNOSIS — N400 Enlarged prostate without lower urinary tract symptoms: Secondary | ICD-10-CM

## 2017-09-23 DIAGNOSIS — C3491 Malignant neoplasm of unspecified part of right bronchus or lung: Secondary | ICD-10-CM

## 2017-09-23 DIAGNOSIS — I10 Essential (primary) hypertension: Secondary | ICD-10-CM

## 2017-09-23 DIAGNOSIS — C7951 Secondary malignant neoplasm of bone: Secondary | ICD-10-CM

## 2017-09-23 DIAGNOSIS — I129 Hypertensive chronic kidney disease with stage 1 through stage 4 chronic kidney disease, or unspecified chronic kidney disease: Secondary | ICD-10-CM

## 2017-09-23 DIAGNOSIS — R5382 Chronic fatigue, unspecified: Secondary | ICD-10-CM

## 2017-09-23 DIAGNOSIS — N189 Chronic kidney disease, unspecified: Secondary | ICD-10-CM

## 2017-09-23 DIAGNOSIS — Z5112 Encounter for antineoplastic immunotherapy: Secondary | ICD-10-CM

## 2017-09-23 DIAGNOSIS — Z79899 Other long term (current) drug therapy: Secondary | ICD-10-CM

## 2017-09-23 DIAGNOSIS — Z85828 Personal history of other malignant neoplasm of skin: Secondary | ICD-10-CM

## 2017-09-23 DIAGNOSIS — R5383 Other fatigue: Secondary | ICD-10-CM

## 2017-09-23 DIAGNOSIS — C3411 Malignant neoplasm of upper lobe, right bronchus or lung: Secondary | ICD-10-CM

## 2017-09-23 DIAGNOSIS — J449 Chronic obstructive pulmonary disease, unspecified: Secondary | ICD-10-CM

## 2017-09-23 DIAGNOSIS — E7800 Pure hypercholesterolemia, unspecified: Secondary | ICD-10-CM

## 2017-09-23 DIAGNOSIS — Z87442 Personal history of urinary calculi: Secondary | ICD-10-CM

## 2017-09-23 DIAGNOSIS — R600 Localized edema: Secondary | ICD-10-CM | POA: Diagnosis not present

## 2017-09-23 DIAGNOSIS — F17210 Nicotine dependence, cigarettes, uncomplicated: Secondary | ICD-10-CM

## 2017-09-23 DIAGNOSIS — M1990 Unspecified osteoarthritis, unspecified site: Secondary | ICD-10-CM

## 2017-09-23 DIAGNOSIS — I4892 Unspecified atrial flutter: Secondary | ICD-10-CM

## 2017-09-23 LAB — CBC WITH DIFFERENTIAL/PLATELET
BASOS ABS: 0 10*3/uL (ref 0.0–0.1)
Basophils Relative: 1 %
EOS PCT: 2 %
Eosinophils Absolute: 0.2 10*3/uL (ref 0.0–0.5)
HCT: 42.7 % (ref 38.4–49.9)
Hemoglobin: 14 g/dL (ref 13.0–17.1)
LYMPHS ABS: 1.8 10*3/uL (ref 0.9–3.3)
Lymphocytes Relative: 25 %
MCH: 28.9 pg (ref 27.2–33.4)
MCHC: 32.8 g/dL (ref 32.0–36.0)
MCV: 88 fL (ref 79.3–98.0)
MONO ABS: 0.6 10*3/uL (ref 0.1–0.9)
Monocytes Relative: 9 %
Neutro Abs: 4.6 10*3/uL (ref 1.5–6.5)
Neutrophils Relative %: 63 %
PLATELETS: 151 10*3/uL (ref 140–400)
RBC: 4.85 MIL/uL (ref 4.20–5.82)
RDW: 13.8 % (ref 11.0–14.6)
WBC: 7.3 10*3/uL (ref 4.0–10.3)

## 2017-09-23 LAB — COMPREHENSIVE METABOLIC PANEL
ALT: 38 U/L (ref 0–55)
AST: 28 U/L (ref 5–34)
Albumin: 3.8 g/dL (ref 3.5–5.0)
Alkaline Phosphatase: 76 U/L (ref 40–150)
Anion gap: 7 (ref 3–11)
BILIRUBIN TOTAL: 0.6 mg/dL (ref 0.2–1.2)
BUN: 15 mg/dL (ref 7–26)
CO2: 28 mmol/L (ref 22–29)
Calcium: 9.4 mg/dL (ref 8.4–10.4)
Chloride: 104 mmol/L (ref 98–109)
Creatinine, Ser: 1.02 mg/dL (ref 0.70–1.30)
GFR calc Af Amer: >60 mL/min (ref >60)
Glucose, Bld: 98 mg/dL (ref 70–140)
Potassium: 4 mmol/L (ref 3.5–5.1)
Sodium: 139 mmol/L (ref 136–145)
Total Protein: 6.9 g/dL (ref 6.4–8.3)

## 2017-09-23 LAB — TSH: TSH: 1.84 u[IU]/mL (ref 0.320–4.118)

## 2017-09-23 MED ORDER — SODIUM CHLORIDE 0.9 % IV SOLN
200.0000 mg | Freq: Once | INTRAVENOUS | Status: AC
  Administered 2017-09-23: 200 mg via INTRAVENOUS
  Filled 2017-09-23: qty 8

## 2017-09-23 MED ORDER — SODIUM CHLORIDE 0.9 % IV SOLN
Freq: Once | INTRAVENOUS | Status: AC
  Administered 2017-09-23: 15:00:00 via INTRAVENOUS

## 2017-09-23 NOTE — Patient Instructions (Signed)
[facility] Discharge Instructions for Patients Receiving Chemotherapy  Today you received the following chemotherapy agents:  Keytruda.  To help prevent nausea and vomiting after your treatment, we encourage you to take your nausea medication as directed.   If you develop nausea and vomiting that is not controlled by your nausea medication, call the clinic.   BELOW ARE SYMPTOMS THAT SHOULD BE REPORTED IMMEDIATELY:  *FEVER GREATER THAN 100.5 F  *CHILLS WITH OR WITHOUT FEVER  NAUSEA AND VOMITING THAT IS NOT CONTROLLED WITH YOUR NAUSEA MEDICATION  *UNUSUAL SHORTNESS OF BREATH  *UNUSUAL BRUISING OR BLEEDING  TENDERNESS IN MOUTH AND THROAT WITH OR WITHOUT PRESENCE OF ULCERS  *URINARY PROBLEMS  *BOWEL PROBLEMS  UNUSUAL RASH Items with * indicate a potential emergency and should be followed up as soon as possible.  Feel free to call the clinic should you have any questions or concerns. The clinic phone number is [phone].  Please show the CHEMO ALERT CARD at check-in to the Emergency Department and triage nurse.    

## 2017-09-23 NOTE — Telephone Encounter (Signed)
Printed avs and calender of upcoming appointment. Per 6/24 los

## 2017-09-23 NOTE — Telephone Encounter (Signed)
Printed avs and calender

## 2017-09-23 NOTE — Progress Notes (Signed)
[facility] [phone]) [phone]   [phone]  OFFICE PROGRESS NOTE  Sheryle Hail, PA-C [address] [place] [address]  DIAGNOSIS: Stage IV (T2a,N3,M1c)non-small cell lung cancer, squamous cell carcinoma presented with right upper lobe lung mass in addition to right hilar and mediastinal adenopathy as well as metastatic disease in the medial right thigh diagnosed in November 2018.  PRIOR THERAPY:  1) Palliative radiotherapy to the right lower extremity mass completed April 09, 2017. 2) Systemic chemotherapy with carboplatin for AUC of 5, paclitaxel 175 mg/M2 and Keytruda 200 mg IV every 3 weeks.  First dose March 12, 2017, status post 4 cycles with partial response.  CURRENT THERAPY: Maintenance treatment with single agent Keytruda 200 mg IV every 3 weeks.  First cycle June 11, 2017.  Status post 5 cycles.  INTERVAL HISTORY: Aaron Sellers 67 y.o. male returns to the clinic today for follow-up visit accompanied by his wife.  The patient is feeling fine today with no specific complaints.  He continues to tolerate his treatment with single agent Keytruda fairly well.  He denied having any chest pain, shortness of breath, cough or hemoptysis.  He denied having any fever or chills.  He has no nausea, vomiting, diarrhea or constipation.  He has no significant weight loss.  He has occasional swelling in the legs more on the right side but it gets better by the end of the day.  He had repeat CT scan of the chest, abdomen and pelvis performed recently and he is here for evaluation and discussion of his discuss results.   MEDICAL HISTORY: Past Medical History:  Diagnosis Date  . Arthritis   . Atrial flutter ([place])   . BPH (benign prostatic hypertrophy) with urinary retention   . Cancer ([place])   . Chronic kidney disease    hx of kidney stones  2011  . COPD (chronic obstructive pulmonary disease) ([place])    has been smoking since he was 22 ish--    . High cholesterol   . Hypertension    dx around 2011  . Squamous cell carcinoma of right lung ([facility])     ALLERGIES:  is allergic to ciprofloxacin; adhesive [tape]; and codeine.  MEDICATIONS:  Current Outpatient Medications  Medication Sig Dispense Refill  . acetaminophen (TYLENOL) 500 MG tablet Take 1,000 mg by mouth every 6 (six) hours as needed for moderate pain.    Marland Kitchen amiodarone (PACERONE) 200 MG tablet Starting 06/13/17, TAKE 200 MG DAILY 90 tablet 2  . atorvastatin (LIPITOR) 10 MG tablet Take 10 mg by mouth every evening.     . Cyanocobalamin (VITAMIN B-12 PO) Take 1 tablet by mouth daily.    . famotidine (PEPCID) 10 MG tablet Take 10 mg by mouth as needed for heartburn or indigestion.    . gabapentin (NEURONTIN) 300 MG capsule Take 1 capsule (300 mg total) by mouth 3 (three) times daily. 90 capsule 1  . loratadine (CLARITIN) 10 MG tablet Take 10 mg by mouth daily.    . tamsulosin (FLOMAX) 0.4 MG CAPS capsule Take 0.4 mg by mouth daily.     No current facility-administered medications for this visit.    Facility-Administered Medications Ordered in Other Visits  Medication Dose Route Frequency Provider Last Rate Last Dose  . heparin lock flush 100 unit/mL  500 Units Intracatheter Once PRN Curt Bears, MD      . sodium chloride flush (NS) 0.9 % injection 10 mL  10 mL Intracatheter PRN  Curt Bears, MD      . sodium chloride flush (NS) 0.9 % injection 10 mL  10 mL Intracatheter PRN Curt Bears, MD        SURGICAL HISTORY:  Past Surgical History:  Procedure Laterality Date  . HERNIA REPAIR     umbilical hernia  08/8125  . INCISION AND DRAINAGE ABSCESS     "down the middle" of his buttocks  2015  . TOTAL KNEE ARTHROPLASTY Left 10/29/2014   Procedure: TOTAL KNEE ARTHROPLASTY;  Surgeon: Dorna Leitz, MD;  Location: [facility];  Service: Orthopedics;  Laterality: Left;    REVIEW OF SYSTEMS:  Constitutional: positive for fatigue Eyes: negative Ears, nose, mouth, throat,  and face: negative Respiratory: negative Cardiovascular: negative Gastrointestinal: negative Genitourinary:negative Integument/breast: negative Hematologic/lymphatic: negative Musculoskeletal:negative Neurological: negative Behavioral/Psych: negative Endocrine: negative Allergic/Immunologic: negative   PHYSICAL EXAMINATION: General appearance: alert, cooperative and no distress Head: Normocephalic, without obvious abnormality, atraumatic Neck: no adenopathy, no JVD, supple, symmetrical, trachea midline and thyroid not enlarged, symmetric, no tenderness/mass/nodules Lymph nodes: Cervical, supraclavicular, and axillary nodes normal. Resp: clear to auscultation bilaterally Back: symmetric, no curvature. ROM normal. No CVA tenderness. Cardio: regular rate and rhythm, S1, S2 normal, no murmur, click, rub or gallop GI: soft, non-tender; bowel sounds normal; no masses,  no organomegaly Extremities: extremities normal, atraumatic, no cyanosis or edema Neurologic: Alert and oriented X 3, normal strength and tone. Normal symmetric reflexes. Normal coordination and gait  ECOG PERFORMANCE STATUS: 1 - Symptomatic but completely ambulatory  Blood pressure (!) 141/82, pulse (!) 56, temperature 97.7 F (36.5 C), temperature source Oral, resp. rate 17, height 6\' 8"  (2.032 m), weight (!) 311 lb 9.6 oz (141.3 kg), SpO2 95 %.  LABORATORY DATA: Lab Results  Component Value Date   WBC 7.3 09/23/2017   HGB 14.0 09/23/2017   HCT 42.7 09/23/2017   MCV 88.0 09/23/2017   PLT 151 09/23/2017      Chemistry      Component Value Date/Time   NA 139 09/03/2017 1106   NA 137 04/03/2017 1030   K 3.6 09/03/2017 1106   K 4.1 04/03/2017 1030   CL 105 09/03/2017 1106   CO2 25 09/03/2017 1106   CO2 27 04/03/2017 1030   BUN 15 09/03/2017 1106   BUN 15.5 04/03/2017 1030   CREATININE 1.02 09/03/2017 1106   CREATININE 0.8 04/03/2017 1030      Component Value Date/Time   CALCIUM 9.3 09/03/2017 1106    CALCIUM 9.4 04/03/2017 1030   ALKPHOS 88 09/03/2017 1106   ALKPHOS 97 04/03/2017 1030   AST 27 09/03/2017 1106   AST 14 04/03/2017 1030   ALT 35 09/03/2017 1106   ALT 21 04/03/2017 1030   BILITOT 0.6 09/03/2017 1106   BILITOT 0.67 04/03/2017 1030       RADIOGRAPHIC STUDIES: Ct Chest W Contrast  Result Date: 09/20/2017 CLINICAL DATA:  Stage IV squamous cell carcinoma of the right lung diagnosed 8 months ago. Chemotherapy ongoing. EXAM: CT CHEST, ABDOMEN, AND PELVIS WITH CONTRAST TECHNIQUE: Multidetector CT imaging of the chest, abdomen and pelvis was performed following the standard protocol during bolus administration of intravenous contrast. CONTRAST:  111mL ISOVUE-300 IOPAMIDOL (ISOVUE-300) INJECTION 61% COMPARISON:  CT 07/22/2017 and 05/20/2017. FINDINGS: CT CHEST FINDINGS Cardiovascular: Atherosclerosis of the aorta, great vessels and coronary arteries again noted. No acute vascular findings are seen. The heart size is normal. There is no pericardial effusion. Mediastinum/Nodes: Patient has developed a mildly enlarged left axillary node measuring 17 mm on image  13/2. Small lymph nodes in the right hilum and subcarinal regions are similar to the previous studies, the latter measuring up to 12 mm short axis on image 29/2. The thyroid gland, trachea and esophagus appear unremarkable. Lungs/Pleura: There is no pleural effusion. There has been further reduction in the size of the cavitary right upper lobe lesion, measuring 17 x 11 mm on image 44/6 (previously 19 x 18 mm). Other scattered small upper lobe nodules bilaterally are stable, most prominent on images 38, 44 and 61/6. No new or enlarging pulmonary nodules. There is underlying emphysema with mild central airway and septal thickening. Musculoskeletal/Chest wall: No chest wall mass or suspicious osseous findings. CT ABDOMEN AND PELVIS FINDINGS Hepatobiliary: The liver is normal in density without focal abnormality. No evidence of gallstones,  gallbladder wall thickening or biliary dilatation. Pancreas: Unremarkable. No pancreatic ductal dilatation or surrounding inflammatory changes. Spleen: Normal in size without focal abnormality. Adrenals/Urinary Tract: Stable 3.7 x 1.7 cm right adrenal nodule, consistent with an adenoma based on previous evaluation. The left adrenal gland appears normal. There are probable tiny nonobstructing calculi in the interpolar regions of both kidneys. There is a 2.3 cm cyst in the interpolar region of the right kidney which appears stable. There is no hydronephrosis or delay in contrast excretion, however, there is a persistent cluster of nonobstructing calculi at the right ureterovesical junction, similar to the previous study. These measure up to 6 mm in diameter on image 129/2. The bladder appears normal. Stomach/Bowel: No evidence of bowel wall thickening, distention or surrounding inflammatory change. There are diverticular changes throughout the distal colon. The appendix appears normal. Vascular/Lymphatic: There are stable mildly prominent lymph nodes in the upper abdomen, including a 15 mm portacaval node on image 70. Small left external iliac and inguinal lymph nodes are stable. Aortic and branch vessel atherosclerosis with stable mild dilatation of the mid aorta to 3.0 cm. No acute vascular findings. Stable left abdominal venous collaterals. Reproductive: The prostate gland and seminal vesicles appear unremarkable. Possible small right scrotal hydrocele, incompletely visualized. Other: No evidence of abdominal wall mass or hernia. No ascites. Musculoskeletal: No acute or significant osseous findings. Lower lumbar spondylosis again noted with potentially symptomatic spinal stenosis at L4-5. Mild foraminal narrowing at the lower 3 levels appears stable. There is stable partial tearing of the left common hamstring tendon. IMPRESSION: 1. Further improvement in the cavitary right upper lobe lesion. Additional small  pulmonary nodules bilaterally are unchanged. 2. Interval moderate enlargement of a left axillary lymph node. This could be inflammatory if the patient has had recent infection in the left upper extremity. Attention on follow-up recommended to exclude metastatic disease. This could be sampled under ultrasound guidance if clinically warranted. 3. Other mildly prominent mediastinal and abdominal lymph nodes are stable, and no other evidence of metastatic disease. 4. Persistent nonobstructing calculi at the right ureterovesical junction. 5. Stable additional incidental findings including a right adrenal adenoma, distal colonic diverticulosis, lumbar spondylosis and Aortic Atherosclerosis (ICD10-I70.0). Electronically Signed   By: Richardean Sale M.D.   On: 09/20/2017 14:58   Ct Abdomen Pelvis W Contrast  Result Date: 09/20/2017 CLINICAL DATA:  Stage IV squamous cell carcinoma of the right lung diagnosed 8 months ago. Chemotherapy ongoing. EXAM: CT CHEST, ABDOMEN, AND PELVIS WITH CONTRAST TECHNIQUE: Multidetector CT imaging of the chest, abdomen and pelvis was performed following the standard protocol during bolus administration of intravenous contrast. CONTRAST:  135mL ISOVUE-300 IOPAMIDOL (ISOVUE-300) INJECTION 61% COMPARISON:  CT 07/22/2017 and 05/20/2017. FINDINGS: CT  CHEST FINDINGS Cardiovascular: Atherosclerosis of the aorta, great vessels and coronary arteries again noted. No acute vascular findings are seen. The heart size is normal. There is no pericardial effusion. Mediastinum/Nodes: Patient has developed a mildly enlarged left axillary node measuring 17 mm on image 13/2. Small lymph nodes in the right hilum and subcarinal regions are similar to the previous studies, the latter measuring up to 12 mm short axis on image 29/2. The thyroid gland, trachea and esophagus appear unremarkable. Lungs/Pleura: There is no pleural effusion. There has been further reduction in the size of the cavitary right upper lobe  lesion, measuring 17 x 11 mm on image 44/6 (previously 19 x 18 mm). Other scattered small upper lobe nodules bilaterally are stable, most prominent on images 38, 44 and 61/6. No new or enlarging pulmonary nodules. There is underlying emphysema with mild central airway and septal thickening. Musculoskeletal/Chest wall: No chest wall mass or suspicious osseous findings. CT ABDOMEN AND PELVIS FINDINGS Hepatobiliary: The liver is normal in density without focal abnormality. No evidence of gallstones, gallbladder wall thickening or biliary dilatation. Pancreas: Unremarkable. No pancreatic ductal dilatation or surrounding inflammatory changes. Spleen: Normal in size without focal abnormality. Adrenals/Urinary Tract: Stable 3.7 x 1.7 cm right adrenal nodule, consistent with an adenoma based on previous evaluation. The left adrenal gland appears normal. There are probable tiny nonobstructing calculi in the interpolar regions of both kidneys. There is a 2.3 cm cyst in the interpolar region of the right kidney which appears stable. There is no hydronephrosis or delay in contrast excretion, however, there is a persistent cluster of nonobstructing calculi at the right ureterovesical junction, similar to the previous study. These measure up to 6 mm in diameter on image 129/2. The bladder appears normal. Stomach/Bowel: No evidence of bowel wall thickening, distention or surrounding inflammatory change. There are diverticular changes throughout the distal colon. The appendix appears normal. Vascular/Lymphatic: There are stable mildly prominent lymph nodes in the upper abdomen, including a 15 mm portacaval node on image 70. Small left external iliac and inguinal lymph nodes are stable. Aortic and branch vessel atherosclerosis with stable mild dilatation of the mid aorta to 3.0 cm. No acute vascular findings. Stable left abdominal venous collaterals. Reproductive: The prostate gland and seminal vesicles appear unremarkable. Possible  small right scrotal hydrocele, incompletely visualized. Other: No evidence of abdominal wall mass or hernia. No ascites. Musculoskeletal: No acute or significant osseous findings. Lower lumbar spondylosis again noted with potentially symptomatic spinal stenosis at L4-5. Mild foraminal narrowing at the lower 3 levels appears stable. There is stable partial tearing of the left common hamstring tendon. IMPRESSION: 1. Further improvement in the cavitary right upper lobe lesion. Additional small pulmonary nodules bilaterally are unchanged. 2. Interval moderate enlargement of a left axillary lymph node. This could be inflammatory if the patient has had recent infection in the left upper extremity. Attention on follow-up recommended to exclude metastatic disease. This could be sampled under ultrasound guidance if clinically warranted. 3. Other mildly prominent mediastinal and abdominal lymph nodes are stable, and no other evidence of metastatic disease. 4. Persistent nonobstructing calculi at the right ureterovesical junction. 5. Stable additional incidental findings including a right adrenal adenoma, distal colonic diverticulosis, lumbar spondylosis and Aortic Atherosclerosis (ICD10-I70.0). Electronically Signed   By: Richardean Sale M.D.   On: 09/20/2017 14:58    ASSESSMENT AND PLAN: This is a very pleasant 67 years old white male with a stage IV non-small cell lung cancer, squamous cell carcinoma.  The patient  is currently undergoing systemic chemotherapy with carboplatin, paclitaxel and Keytruda status post 4 cycles with partial response. He is currently undergoing maintenance treatment with single agent Keytruda status post 5 cycles. He has been tolerating this treatment well with no concerning complaints. The patient had repeat CT scan of the chest, abdomen and pelvis performed recently.  I personally and independently reviewed the scans and discussed the results with the patient today. His scan showed no  concerning findings for disease progression and there was further mild improvement of his disease. I recommended for the patient to continue his treatment with [facility] and he will proceed with cycle #6 today. I will see the patient back for follow-up visit in 3 weeks for evaluation before the next cycle of his treatment. He was advised to call immediately if he has any concerning symptoms in the interval. The patient voices understanding of current disease status and treatment options and is in agreement with the current care plan. All questions were answered. The patient knows to call the clinic with any problems, questions or concerns. We can certainly see the patient much sooner if necessary.  Disclaimer: This note was dictated with voice recognition software. Similar sounding words can inadvertently be transcribed and may not be corrected upon review.

## 2017-09-24 ENCOUNTER — Other Ambulatory Visit: Payer: Managed Care, Other (non HMO)

## 2017-09-24 ENCOUNTER — Ambulatory Visit: Payer: Managed Care, Other (non HMO) | Admitting: Internal Medicine

## 2017-09-24 ENCOUNTER — Ambulatory Visit: Payer: Managed Care, Other (non HMO)

## 2017-10-15 ENCOUNTER — Telehealth: Payer: Self-pay | Admitting: Internal Medicine

## 2017-10-15 ENCOUNTER — Inpatient Hospital Stay: Payer: Medicare Other

## 2017-10-15 ENCOUNTER — Inpatient Hospital Stay: Payer: Medicare Other | Attending: Oncology

## 2017-10-15 ENCOUNTER — Inpatient Hospital Stay (HOSPITAL_BASED_OUTPATIENT_CLINIC_OR_DEPARTMENT_OTHER): Payer: Medicare Other | Admitting: Internal Medicine

## 2017-10-15 ENCOUNTER — Encounter: Payer: Self-pay | Admitting: Internal Medicine

## 2017-10-15 VITALS — BP 144/84 | HR 58 | Temp 97.7°F | Resp 17 | Ht >= 80 in | Wt 316.2 lb

## 2017-10-15 DIAGNOSIS — Z79899 Other long term (current) drug therapy: Secondary | ICD-10-CM | POA: Insufficient documentation

## 2017-10-15 DIAGNOSIS — J449 Chronic obstructive pulmonary disease, unspecified: Secondary | ICD-10-CM

## 2017-10-15 DIAGNOSIS — I129 Hypertensive chronic kidney disease with stage 1 through stage 4 chronic kidney disease, or unspecified chronic kidney disease: Secondary | ICD-10-CM | POA: Insufficient documentation

## 2017-10-15 DIAGNOSIS — Z923 Personal history of irradiation: Secondary | ICD-10-CM | POA: Insufficient documentation

## 2017-10-15 DIAGNOSIS — C3411 Malignant neoplasm of upper lobe, right bronchus or lung: Secondary | ICD-10-CM

## 2017-10-15 DIAGNOSIS — M1990 Unspecified osteoarthritis, unspecified site: Secondary | ICD-10-CM | POA: Diagnosis not present

## 2017-10-15 DIAGNOSIS — C7951 Secondary malignant neoplasm of bone: Secondary | ICD-10-CM

## 2017-10-15 DIAGNOSIS — I4892 Unspecified atrial flutter: Secondary | ICD-10-CM

## 2017-10-15 DIAGNOSIS — N189 Chronic kidney disease, unspecified: Secondary | ICD-10-CM | POA: Insufficient documentation

## 2017-10-15 DIAGNOSIS — R600 Localized edema: Secondary | ICD-10-CM | POA: Insufficient documentation

## 2017-10-15 DIAGNOSIS — R5382 Chronic fatigue, unspecified: Secondary | ICD-10-CM

## 2017-10-15 DIAGNOSIS — C3491 Malignant neoplasm of unspecified part of right bronchus or lung: Secondary | ICD-10-CM

## 2017-10-15 DIAGNOSIS — Z5112 Encounter for antineoplastic immunotherapy: Secondary | ICD-10-CM | POA: Diagnosis not present

## 2017-10-15 DIAGNOSIS — E7800 Pure hypercholesterolemia, unspecified: Secondary | ICD-10-CM | POA: Insufficient documentation

## 2017-10-15 LAB — CBC WITH DIFFERENTIAL (CANCER CENTER ONLY)
BASOS PCT: 1 %
Basophils Absolute: 0 10*3/uL (ref 0.0–0.1)
Eosinophils Absolute: 0.2 10*3/uL (ref 0.0–0.5)
Eosinophils Relative: 3 %
HEMATOCRIT: 43.5 % (ref 38.4–49.9)
Hemoglobin: 14.4 g/dL (ref 13.0–17.1)
Lymphocytes Relative: 23 %
Lymphs Abs: 1.4 10*3/uL (ref 0.9–3.3)
MCH: 28.5 pg (ref 27.2–33.4)
MCHC: 33.2 g/dL (ref 32.0–36.0)
MCV: 85.9 fL (ref 79.3–98.0)
MONO ABS: 0.7 10*3/uL (ref 0.1–0.9)
MONOS PCT: 12 %
Neutro Abs: 3.7 10*3/uL (ref 1.5–6.5)
Neutrophils Relative %: 61 %
Platelet Count: 163 10*3/uL (ref 140–400)
RBC: 5.06 MIL/uL (ref 4.20–5.82)
RDW: 14.5 % (ref 11.0–14.6)
WBC Count: 6 10*3/uL (ref 4.0–10.3)

## 2017-10-15 LAB — CMP (CANCER CENTER ONLY)
ALK PHOS: 83 U/L (ref 38–126)
ALT: 46 U/L — ABNORMAL HIGH (ref 0–44)
AST: 34 U/L (ref 15–41)
Albumin: 3.8 g/dL (ref 3.5–5.0)
Anion gap: 8 (ref 5–15)
BUN: 14 mg/dL (ref 8–23)
CALCIUM: 9.4 mg/dL (ref 8.9–10.3)
CO2: 27 mmol/L (ref 22–32)
CREATININE: 0.96 mg/dL (ref 0.61–1.24)
Chloride: 104 mmol/L (ref 98–111)
GFR, Est AFR Am: >60 mL/min (ref >60)
GFR, Estimated: >60 mL/min (ref >60)
Glucose, Bld: 104 mg/dL — ABNORMAL HIGH (ref 70–99)
Potassium: 3.7 mmol/L (ref 3.5–5.1)
Sodium: 139 mmol/L (ref 135–145)
Total Bilirubin: 0.9 mg/dL (ref 0.3–1.2)
Total Protein: 7 g/dL (ref 6.5–8.1)

## 2017-10-15 LAB — TSH: TSH: 2.076 u[IU]/mL (ref 0.320–4.118)

## 2017-10-15 MED ORDER — SODIUM CHLORIDE 0.9 % IV SOLN
200.0000 mg | Freq: Once | INTRAVENOUS | Status: AC
  Administered 2017-10-15: 200 mg via INTRAVENOUS
  Filled 2017-10-15: qty 8

## 2017-10-15 MED ORDER — SODIUM CHLORIDE 0.9 % IV SOLN
Freq: Once | INTRAVENOUS | Status: AC
Start: 2017-10-15 — End: 2017-10-15
  Administered 2017-10-15: 13:00:00 via INTRAVENOUS

## 2017-10-15 NOTE — Telephone Encounter (Signed)
Scheduled appt per 7/16 los - pt to get an updated schedule next visit.

## 2017-10-15 NOTE — Progress Notes (Signed)
Welcome [phone]   [phone]) [phone]  OFFICE PROGRESS NOTE  Sheryle Hail, PA-C [address] [place] [address]  DIAGNOSIS: Stage IV (T2a,N3,M1c)non-small cell lung cancer, squamous cell carcinoma presented with right upper lobe lung mass in addition to right hilar and mediastinal adenopathy as well as metastatic disease in the medial right thigh diagnosed in November 2018.  PRIOR THERAPY:  1) Palliative radiotherapy to the right lower extremity mass completed April 09, 2017. 2) Systemic chemotherapy with carboplatin for AUC of 5, paclitaxel 175 mg/M2 and Keytruda 200 mg IV every 3 weeks.  First dose March 12, 2017, status post 4 cycles with partial response.  CURRENT THERAPY: Maintenance treatment with single agent Keytruda 200 mg IV every 3 weeks.  First cycle June 11, 2017.  Status post 10 cycles.  INTERVAL HISTORY: Aaron Sellers 67 y.o. male returns to the clinic today for follow-up visit accompanied by his wife.  The patient is feeling fine today with no specific complaints except for swelling of the lower extremities.  He denied having any chest pain, shortness breath, cough or hemoptysis.  He denied having any fever or chills.  He has no nausea, vomiting, diarrhea or constipation.  He denied having any significant weight loss or night sweats.  The patient has no headache or visual changes. He is here today for evaluation before starting cycle #11.    MEDICAL HISTORY: Past Medical History:  Diagnosis Date  . Arthritis   . Atrial flutter ([place])   . BPH (benign prostatic hypertrophy) with urinary retention   . Cancer ([place])   . Chronic kidney disease    hx of kidney stones  2011  . COPD (chronic obstructive pulmonary disease) ([place])    has been smoking since he was 22 ish--  . High cholesterol   . Hypertension    dx around 2011  . Squamous cell carcinoma of right lung (HCC)     ALLERGIES:  is allergic to  ciprofloxacin; adhesive [tape]; and codeine.  MEDICATIONS:  Current Outpatient Medications  Medication Sig Dispense Refill  . acetaminophen (TYLENOL) 500 MG tablet Take 1,000 mg by mouth every 6 (six) hours as needed for moderate pain.    Marland Kitchen amiodarone (PACERONE) 200 MG tablet Starting 06/13/17, TAKE 200 MG DAILY 90 tablet 2  . atorvastatin (LIPITOR) 10 MG tablet Take 10 mg by mouth every evening.     . Cyanocobalamin (VITAMIN B-12 PO) Take 1 tablet by mouth daily.    . famotidine (PEPCID) 10 MG tablet Take 10 mg by mouth as needed for heartburn or indigestion.    . gabapentin (NEURONTIN) 300 MG capsule Take 1 capsule (300 mg total) by mouth 3 (three) times daily. 90 capsule 1  . loratadine (CLARITIN) 10 MG tablet Take 10 mg by mouth daily.    . tamsulosin (FLOMAX) 0.4 MG CAPS capsule Take 0.4 mg by mouth daily.     No current facility-administered medications for this visit.    Facility-Administered Medications Ordered in Other Visits  Medication Dose Route Frequency Provider Last Rate Last Dose  . heparin lock flush 100 unit/mL  500 Units Intracatheter Once PRN Curt Bears, MD      . sodium chloride flush (NS) 0.9 % injection 10 mL  10 mL Intracatheter PRN Curt Bears, MD        SURGICAL HISTORY:  Past Surgical History:  Procedure Laterality Date  . HERNIA REPAIR     umbilical hernia  08/1441  .  INCISION AND DRAINAGE ABSCESS     "down the middle" of his buttocks  2015  . TOTAL KNEE ARTHROPLASTY Left 10/29/2014   Procedure: TOTAL KNEE ARTHROPLASTY;  Surgeon: Dorna Leitz, MD;  Location: [facility];  Service: Orthopedics;  Laterality: Left;    REVIEW OF SYSTEMS:  A comprehensive review of systems was negative except for: Constitutional: positive for fatigue   PHYSICAL EXAMINATION: General appearance: alert, cooperative and no distress Head: Normocephalic, without obvious abnormality, atraumatic Neck: no adenopathy, no JVD, supple, symmetrical, trachea midline and thyroid not  enlarged, symmetric, no tenderness/mass/nodules Lymph nodes: Cervical, supraclavicular, and axillary nodes normal. Resp: clear to auscultation bilaterally Back: symmetric, no curvature. ROM normal. No CVA tenderness. Cardio: regular rate and rhythm, S1, S2 normal, no murmur, click, rub or gallop GI: soft, non-tender; bowel sounds normal; no masses,  no organomegaly Extremities: extremities normal, atraumatic, no cyanosis or edema  ECOG PERFORMANCE STATUS: 1 - Symptomatic but completely ambulatory  Blood pressure (!) 144/84, pulse (!) 58, temperature 97.7 F (36.5 C), temperature source Oral, resp. rate 17, height 6\' 8"  (2.032 m), weight (!) 316 lb 3.2 oz (143.4 kg), SpO2 96 %.  LABORATORY DATA: Lab Results  Component Value Date   WBC 7.3 09/23/2017   HGB 14.0 09/23/2017   HCT 42.7 09/23/2017   MCV 88.0 09/23/2017   PLT 151 09/23/2017      Chemistry      Component Value Date/Time   NA 139 09/23/2017 1310   NA 137 04/03/2017 1030   K 4.0 09/23/2017 1310   K 4.1 04/03/2017 1030   CL 104 09/23/2017 1310   CO2 28 09/23/2017 1310   CO2 27 04/03/2017 1030   BUN 15 09/23/2017 1310   BUN 15.5 04/03/2017 1030   CREATININE 1.02 09/23/2017 1310   CREATININE 0.8 04/03/2017 1030      Component Value Date/Time   CALCIUM 9.4 09/23/2017 1310   CALCIUM 9.4 04/03/2017 1030   ALKPHOS 76 09/23/2017 1310   ALKPHOS 97 04/03/2017 1030   AST 28 09/23/2017 1310   AST 14 04/03/2017 1030   ALT 38 09/23/2017 1310   ALT 21 04/03/2017 1030   BILITOT 0.6 09/23/2017 1310   BILITOT 0.67 04/03/2017 1030       RADIOGRAPHIC STUDIES: Ct Chest W Contrast  Result Date: 09/20/2017 CLINICAL DATA:  Stage IV squamous cell carcinoma of the right lung diagnosed 8 months ago. Chemotherapy ongoing. EXAM: CT CHEST, ABDOMEN, AND PELVIS WITH CONTRAST TECHNIQUE: Multidetector CT imaging of the chest, abdomen and pelvis was performed following the standard protocol during bolus administration of intravenous  contrast. CONTRAST:  137mL ISOVUE-300 IOPAMIDOL (ISOVUE-300) INJECTION 61% COMPARISON:  CT 07/22/2017 and 05/20/2017. FINDINGS: CT CHEST FINDINGS Cardiovascular: Atherosclerosis of the aorta, great vessels and coronary arteries again noted. No acute vascular findings are seen. The heart size is normal. There is no pericardial effusion. Mediastinum/Nodes: Patient has developed a mildly enlarged left axillary node measuring 17 mm on image 13/2. Small lymph nodes in the right hilum and subcarinal regions are similar to the previous studies, the latter measuring up to 12 mm short axis on image 29/2. The thyroid gland, trachea and esophagus appear unremarkable. Lungs/Pleura: There is no pleural effusion. There has been further reduction in the size of the cavitary right upper lobe lesion, measuring 17 x 11 mm on image 44/6 (previously 19 x 18 mm). Other scattered small upper lobe nodules bilaterally are stable, most prominent on images 38, 44 and 61/6. No new or enlarging pulmonary nodules.  There is underlying emphysema with mild central airway and septal thickening. Musculoskeletal/Chest wall: No chest wall mass or suspicious osseous findings. CT ABDOMEN AND PELVIS FINDINGS Hepatobiliary: The liver is normal in density without focal abnormality. No evidence of gallstones, gallbladder wall thickening or biliary dilatation. Pancreas: Unremarkable. No pancreatic ductal dilatation or surrounding inflammatory changes. Spleen: Normal in size without focal abnormality. Adrenals/Urinary Tract: Stable 3.7 x 1.7 cm right adrenal nodule, consistent with an adenoma based on previous evaluation. The left adrenal gland appears normal. There are probable tiny nonobstructing calculi in the interpolar regions of both kidneys. There is a 2.3 cm cyst in the interpolar region of the right kidney which appears stable. There is no hydronephrosis or delay in contrast excretion, however, there is a persistent cluster of nonobstructing calculi  at the right ureterovesical junction, similar to the previous study. These measure up to 6 mm in diameter on image 129/2. The bladder appears normal. Stomach/Bowel: No evidence of bowel wall thickening, distention or surrounding inflammatory change. There are diverticular changes throughout the distal colon. The appendix appears normal. Vascular/Lymphatic: There are stable mildly prominent lymph nodes in the upper abdomen, including a 15 mm portacaval node on image 70. Small left external iliac and inguinal lymph nodes are stable. Aortic and branch vessel atherosclerosis with stable mild dilatation of the mid aorta to 3.0 cm. No acute vascular findings. Stable left abdominal venous collaterals. Reproductive: The prostate gland and seminal vesicles appear unremarkable. Possible small right scrotal hydrocele, incompletely visualized. Other: No evidence of abdominal wall mass or hernia. No ascites. Musculoskeletal: No acute or significant osseous findings. Lower lumbar spondylosis again noted with potentially symptomatic spinal stenosis at L4-5. Mild foraminal narrowing at the lower 3 levels appears stable. There is stable partial tearing of the left common hamstring tendon. IMPRESSION: 1. Further improvement in the cavitary right upper lobe lesion. Additional small pulmonary nodules bilaterally are unchanged. 2. Interval moderate enlargement of a left axillary lymph node. This could be inflammatory if the patient has had recent infection in the left upper extremity. Attention on follow-up recommended to exclude metastatic disease. This could be sampled under ultrasound guidance if clinically warranted. 3. Other mildly prominent mediastinal and abdominal lymph nodes are stable, and no other evidence of metastatic disease. 4. Persistent nonobstructing calculi at the right ureterovesical junction. 5. Stable additional incidental findings including a right adrenal adenoma, distal colonic diverticulosis, lumbar spondylosis  and Aortic Atherosclerosis (ICD10-I70.0). Electronically Signed   By: Richardean Sale M.D.   On: 09/20/2017 14:58   Ct Abdomen Pelvis W Contrast  Result Date: 09/20/2017 CLINICAL DATA:  Stage IV squamous cell carcinoma of the right lung diagnosed 8 months ago. Chemotherapy ongoing. EXAM: CT CHEST, ABDOMEN, AND PELVIS WITH CONTRAST TECHNIQUE: Multidetector CT imaging of the chest, abdomen and pelvis was performed following the standard protocol during bolus administration of intravenous contrast. CONTRAST:  15mL ISOVUE-300 IOPAMIDOL (ISOVUE-300) INJECTION 61% COMPARISON:  CT 07/22/2017 and 05/20/2017. FINDINGS: CT CHEST FINDINGS Cardiovascular: Atherosclerosis of the aorta, great vessels and coronary arteries again noted. No acute vascular findings are seen. The heart size is normal. There is no pericardial effusion. Mediastinum/Nodes: Patient has developed a mildly enlarged left axillary node measuring 17 mm on image 13/2. Small lymph nodes in the right hilum and subcarinal regions are similar to the previous studies, the latter measuring up to 12 mm short axis on image 29/2. The thyroid gland, trachea and esophagus appear unremarkable. Lungs/Pleura: There is no pleural effusion. There has been further reduction in  the size of the cavitary right upper lobe lesion, measuring 17 x 11 mm on image 44/6 (previously 19 x 18 mm). Other scattered small upper lobe nodules bilaterally are stable, most prominent on images 38, 44 and 61/6. No new or enlarging pulmonary nodules. There is underlying emphysema with mild central airway and septal thickening. Musculoskeletal/Chest wall: No chest wall mass or suspicious osseous findings. CT ABDOMEN AND PELVIS FINDINGS Hepatobiliary: The liver is normal in density without focal abnormality. No evidence of gallstones, gallbladder wall thickening or biliary dilatation. Pancreas: Unremarkable. No pancreatic ductal dilatation or surrounding inflammatory changes. Spleen: Normal in  size without focal abnormality. Adrenals/Urinary Tract: Stable 3.7 x 1.7 cm right adrenal nodule, consistent with an adenoma based on previous evaluation. The left adrenal gland appears normal. There are probable tiny nonobstructing calculi in the interpolar regions of both kidneys. There is a 2.3 cm cyst in the interpolar region of the right kidney which appears stable. There is no hydronephrosis or delay in contrast excretion, however, there is a persistent cluster of nonobstructing calculi at the right ureterovesical junction, similar to the previous study. These measure up to 6 mm in diameter on image 129/2. The bladder appears normal. Stomach/Bowel: No evidence of bowel wall thickening, distention or surrounding inflammatory change. There are diverticular changes throughout the distal colon. The appendix appears normal. Vascular/Lymphatic: There are stable mildly prominent lymph nodes in the upper abdomen, including a 15 mm portacaval node on image 70. Small left external iliac and inguinal lymph nodes are stable. Aortic and branch vessel atherosclerosis with stable mild dilatation of the mid aorta to 3.0 cm. No acute vascular findings. Stable left abdominal venous collaterals. Reproductive: The prostate gland and seminal vesicles appear unremarkable. Possible small right scrotal hydrocele, incompletely visualized. Other: No evidence of abdominal wall mass or hernia. No ascites. Musculoskeletal: No acute or significant osseous findings. Lower lumbar spondylosis again noted with potentially symptomatic spinal stenosis at L4-5. Mild foraminal narrowing at the lower 3 levels appears stable. There is stable partial tearing of the left common hamstring tendon. IMPRESSION: 1. Further improvement in the cavitary right upper lobe lesion. Additional small pulmonary nodules bilaterally are unchanged. 2. Interval moderate enlargement of a left axillary lymph node. This could be inflammatory if the patient has had recent  infection in the left upper extremity. Attention on follow-up recommended to exclude metastatic disease. This could be sampled under ultrasound guidance if clinically warranted. 3. Other mildly prominent mediastinal and abdominal lymph nodes are stable, and no other evidence of metastatic disease. 4. Persistent nonobstructing calculi at the right ureterovesical junction. 5. Stable additional incidental findings including a right adrenal adenoma, distal colonic diverticulosis, lumbar spondylosis and Aortic Atherosclerosis (ICD10-I70.0). Electronically Signed   By: Richardean Sale M.D.   On: 09/20/2017 14:58    ASSESSMENT AND PLAN: This is a very pleasant 67 years old white male with a stage IV non-small cell lung cancer, squamous cell carcinoma.  The patient is currently undergoing systemic chemotherapy with carboplatin, paclitaxel and Keytruda status post 4 cycles with partial response. He is currently undergoing maintenance treatment with single agent Keytruda status post 10 cycles. He continues to tolerate this treatment well. I recommended for the patient to proceed with cycle #11 today as scheduled. I we will see him back for follow-up visit in 3 weeks for evaluation before the next cycle of his treatment. He was advised to call immediately if he has any concerning symptoms in the interval. The patient voices understanding of current disease  status and treatment options and is in agreement with the current care plan. All questions were answered. The patient knows to call the clinic with any problems, questions or concerns. We can certainly see the patient much sooner if necessary.  Disclaimer: This note was dictated with voice recognition software. Similar sounding words can inadvertently be transcribed and may not be corrected upon review.

## 2017-10-15 NOTE — Patient Instructions (Signed)
[facility] Discharge Instructions for Patients Receiving Chemotherapy  Today you received the following chemotherapy agents :  Keytruda.  To help prevent nausea and vomiting after your treatment, we encourage you to take your nausea medication as prescribed.   If you develop nausea and vomiting that is not controlled by your nausea medication, call the clinic.   BELOW ARE SYMPTOMS THAT SHOULD BE REPORTED IMMEDIATELY:  *FEVER GREATER THAN 100.5 F  *CHILLS WITH OR WITHOUT FEVER  NAUSEA AND VOMITING THAT IS NOT CONTROLLED WITH YOUR NAUSEA MEDICATION  *UNUSUAL SHORTNESS OF BREATH  *UNUSUAL BRUISING OR BLEEDING  TENDERNESS IN MOUTH AND THROAT WITH OR WITHOUT PRESENCE OF ULCERS  *URINARY PROBLEMS  *BOWEL PROBLEMS  UNUSUAL RASH Items with * indicate a potential emergency and should be followed up as soon as possible.  Feel free to call the clinic should you have any questions or concerns. The clinic phone number is [phone].  Please show the CHEMO ALERT CARD at check-in to the Emergency Department and triage nurse.  

## 2017-10-17 ENCOUNTER — Ambulatory Visit (INDEPENDENT_AMBULATORY_CARE_PROVIDER_SITE_OTHER): Payer: Managed Care, Other (non HMO) | Admitting: Cardiology

## 2017-10-17 ENCOUNTER — Encounter: Payer: Self-pay | Admitting: Cardiology

## 2017-10-17 VITALS — BP 124/78 | HR 71 | Ht >= 80 in | Wt 315.0 lb

## 2017-10-17 DIAGNOSIS — R600 Localized edema: Secondary | ICD-10-CM

## 2017-10-17 DIAGNOSIS — I4892 Unspecified atrial flutter: Secondary | ICD-10-CM

## 2017-10-17 MED ORDER — FUROSEMIDE 40 MG PO TABS: 40.0000 mg | ORAL_TABLET | Freq: Every day | ORAL | 3 refills | Status: AC | PRN

## 2017-10-17 NOTE — Progress Notes (Signed)
Clinical Summary Aaron Sellers is a 67 y.o.male seen today for follow up of the following medical problems.     1. Paroxysmal aflutter - admission 05/2017 with perforated bowel, issues with aflutter with RVR. Similar issues with his heart rates when admitted with diverticulitis previously - failed av nodal agents, started on IV amiodarone and converted to oral amio.  - he had not been on anticoag. When I saw him in 12/2016 he had a partially perforated bowel and newly diagnosed lung mass combined with low CHADS2Vasc score of 1, and thus anticoag was not started at that time. - prior to recent admission, rates had been reasonably been controlled on dilt 300mg  daily.   - denies any palpitations - no recent dizziness.  TSH 2, K 3.7, AST 34 ALT 46 on amio   2. Stage IV lung CA - followed by oncology, on chemo currently. From review of onc note 02/27/17 appears treatment is palliative.  3. Leg swelling - ongoing edema. Has been working on cutting back on sodium    Past Medical History:  Diagnosis Date  . Arthritis   . Atrial flutter ([place])   . BPH (benign prostatic hypertrophy) with urinary retention   . Cancer ([place])   . Chronic kidney disease    hx of kidney stones  2011  . COPD (chronic obstructive pulmonary disease) ([place])    has been smoking since he was 22 ish--  . High cholesterol   . Hypertension    dx around 2011  . Squamous cell carcinoma of right lung (HCC)      Allergies  Allergen Reactions  . Ciprofloxacin     HIVES  . Adhesive [Tape] Other (See Comments)    Skin peels  . Codeine Other (See Comments)    Jitters, "skin crawling"     Current Outpatient Medications  Medication Sig Dispense Refill  . acetaminophen (TYLENOL) 500 MG tablet Take 1,000 mg by mouth every 6 (six) hours as needed for moderate pain.    Marland Kitchen amiodarone (PACERONE) 200 MG tablet Starting 06/13/17, TAKE 200 MG DAILY 90 tablet 2  . atorvastatin (LIPITOR) 10 MG tablet Take 10 mg by mouth  every evening.     . Cyanocobalamin (VITAMIN B-12 PO) Take 1 tablet by mouth daily.    . famotidine (PEPCID) 10 MG tablet Take 10 mg by mouth as needed for heartburn or indigestion.    . gabapentin (NEURONTIN) 300 MG capsule Take 1 capsule (300 mg total) by mouth 3 (three) times daily. 90 capsule 1  . loratadine (CLARITIN) 10 MG tablet Take 10 mg by mouth daily.    . tamsulosin (FLOMAX) 0.4 MG CAPS capsule Take 0.4 mg by mouth daily.     No current facility-administered medications for this visit.    Facility-Administered Medications Ordered in Other Visits  Medication Dose Route Frequency Provider Last Rate Last Dose  . heparin lock flush 100 unit/mL  500 Units Intracatheter Once PRN Curt Bears, MD      . sodium chloride flush (NS) 0.9 % injection 10 mL  10 mL Intracatheter PRN Curt Bears, MD         Past Surgical History:  Procedure Laterality Date  . HERNIA REPAIR     umbilical hernia  0/6301  . INCISION AND DRAINAGE ABSCESS     "down the middle" of his buttocks  2015  . TOTAL KNEE ARTHROPLASTY Left 10/29/2014   Procedure: TOTAL KNEE ARTHROPLASTY;  Surgeon: Dorna Leitz, MD;  Location: [facility]  OR;  Service: Orthopedics;  Laterality: Left;     Allergies  Allergen Reactions  . Ciprofloxacin     HIVES  . Adhesive [Tape] Other (See Comments)    Skin peels  . Codeine Other (See Comments)    Jitters, "skin crawling"      Family History  Problem Relation Age of Onset  . Hypertension Mother   . CVA Father   . Lung cancer Sister   . Lung cancer Brother   . Hypertension Brother   . Hypertension Sister      Social History Mr. Goldin reports that he quit smoking about 8 months ago. His smoking use included cigarettes. He has a 40.00 pack-year smoking history. He has quit using smokeless tobacco. His smokeless tobacco use included snuff. Mr. Masi reports that he does not drink alcohol.   Review of Systems CONSTITUTIONAL: No weight loss, fever, chills, weakness or  fatigue.  HEENT: Eyes: No visual loss, blurred vision, double vision or yellow sclerae.No hearing loss, sneezing, congestion, runny nose or sore throat.  SKIN: No rash or itching.  CARDIOVASCULAR: per hpi RESPIRATORY: No shortness of breath, cough or sputum.  GASTROINTESTINAL: No anorexia, nausea, vomiting or diarrhea. No abdominal pain or blood.  GENITOURINARY: No burning on urination, no polyuria NEUROLOGICAL: No headache, dizziness, syncope, paralysis, ataxia, numbness or tingling in the extremities. No change in bowel or bladder control.  MUSCULOSKELETAL: No muscle, back pain, joint pain or stiffness.  LYMPHATICS: No enlarged nodes. No history of splenectomy.  PSYCHIATRIC: No history of depression or anxiety.  ENDOCRINOLOGIC: No reports of sweating, cold or heat intolerance. No polyuria or polydipsia.  Marland Kitchen   Physical Examination Vitals:   10/17/17 1337  BP: 124/78  Pulse: 71  SpO2: 96%   Vitals:   10/17/17 1337  Weight: (!) 315 lb (142.9 kg)  Height: 6\' 8"  (2.032 m)    Gen: resting comfortably, no acute distress HEENT: no scleral icterus, pupils equal round and reactive, no palptable cervical adenopathy,  CV: RRR, no m/r/g, no jvd Resp: Clear to auscultation bilaterally GI: abdomen is soft, non-tender, non-distended, normal bowel sounds, no hepatosplenomegaly MSK: extremities are warm, 1+ bilateral edema  Skin: warm, no rash Neuro:  no focal deficits Psych: appropriate affect     Assessment and Plan  1. Paroxysmal aflutter - recent exacerbations due to admission with diverticulitis and bowel perforation.  - prior to recent admission, had been reasonably well controlled with dilt 300mg  daily. During last admission with bowel perforation failed av nodal agents, started on amio and now on oral amio  - we will continue amio for now, if continues to do well consider trying to transition back to dilt 300mg  daily if bp's would be acceptable.  - no anticoag due to low  CHADS2Vasc score of 1 and also recurrent issues with perforated bowel.   2. LE edema - we will start lasix 40mg  prn. Prior echo showed normal LVEF, suspect he has some diastolic dysfunction though echo could not technically indentify    F/u 4 months  Arnoldo Lenis, M.D.

## 2017-10-17 NOTE — Patient Instructions (Signed)
Medication Instructions:  START LASIX 40 MG DAILY AS NEEDED FOR SWELLING   Labwork: NONE  Testing/Procedures: NONE  Follow-Up: Your physician recommends that you schedule a follow-up appointment in: 4 MONTHS (EDEN IS FINE)   Any Other Special Instructions Will Be Listed Below (If Applicable).     If you need a refill on your cardiac medications before your next appointment, please call your pharmacy.

## 2017-10-28 ENCOUNTER — Encounter: Payer: Self-pay | Admitting: Cardiology

## 2017-11-05 ENCOUNTER — Telehealth: Payer: Self-pay

## 2017-11-05 ENCOUNTER — Inpatient Hospital Stay: Payer: Medicare Other

## 2017-11-05 ENCOUNTER — Encounter: Payer: Self-pay | Admitting: Internal Medicine

## 2017-11-05 ENCOUNTER — Inpatient Hospital Stay: Payer: Medicare Other | Attending: Oncology

## 2017-11-05 ENCOUNTER — Inpatient Hospital Stay (HOSPITAL_BASED_OUTPATIENT_CLINIC_OR_DEPARTMENT_OTHER): Payer: Medicare Other | Admitting: Internal Medicine

## 2017-11-05 VITALS — BP 139/70 | HR 59 | Temp 97.5°F | Resp 18 | Ht >= 80 in | Wt 308.8 lb

## 2017-11-05 DIAGNOSIS — R5383 Other fatigue: Secondary | ICD-10-CM | POA: Insufficient documentation

## 2017-11-05 DIAGNOSIS — N400 Enlarged prostate without lower urinary tract symptoms: Secondary | ICD-10-CM | POA: Diagnosis not present

## 2017-11-05 DIAGNOSIS — E7800 Pure hypercholesterolemia, unspecified: Secondary | ICD-10-CM

## 2017-11-05 DIAGNOSIS — J449 Chronic obstructive pulmonary disease, unspecified: Secondary | ICD-10-CM

## 2017-11-05 DIAGNOSIS — I129 Hypertensive chronic kidney disease with stage 1 through stage 4 chronic kidney disease, or unspecified chronic kidney disease: Secondary | ICD-10-CM

## 2017-11-05 DIAGNOSIS — Z79899 Other long term (current) drug therapy: Secondary | ICD-10-CM | POA: Insufficient documentation

## 2017-11-05 DIAGNOSIS — M25512 Pain in left shoulder: Secondary | ICD-10-CM | POA: Insufficient documentation

## 2017-11-05 DIAGNOSIS — M25511 Pain in right shoulder: Secondary | ICD-10-CM

## 2017-11-05 DIAGNOSIS — C3411 Malignant neoplasm of upper lobe, right bronchus or lung: Secondary | ICD-10-CM | POA: Insufficient documentation

## 2017-11-05 DIAGNOSIS — N189 Chronic kidney disease, unspecified: Secondary | ICD-10-CM | POA: Diagnosis not present

## 2017-11-05 DIAGNOSIS — F17210 Nicotine dependence, cigarettes, uncomplicated: Secondary | ICD-10-CM | POA: Insufficient documentation

## 2017-11-05 DIAGNOSIS — Z5112 Encounter for antineoplastic immunotherapy: Secondary | ICD-10-CM | POA: Insufficient documentation

## 2017-11-05 DIAGNOSIS — C7951 Secondary malignant neoplasm of bone: Secondary | ICD-10-CM | POA: Insufficient documentation

## 2017-11-05 DIAGNOSIS — I4892 Unspecified atrial flutter: Secondary | ICD-10-CM | POA: Diagnosis not present

## 2017-11-05 DIAGNOSIS — C3491 Malignant neoplasm of unspecified part of right bronchus or lung: Secondary | ICD-10-CM

## 2017-11-05 DIAGNOSIS — G629 Polyneuropathy, unspecified: Secondary | ICD-10-CM | POA: Diagnosis not present

## 2017-11-05 DIAGNOSIS — Z923 Personal history of irradiation: Secondary | ICD-10-CM | POA: Diagnosis not present

## 2017-11-05 DIAGNOSIS — Z9221 Personal history of antineoplastic chemotherapy: Secondary | ICD-10-CM | POA: Diagnosis not present

## 2017-11-05 DIAGNOSIS — C3490 Malignant neoplasm of unspecified part of unspecified bronchus or lung: Secondary | ICD-10-CM

## 2017-11-05 LAB — CBC WITH DIFFERENTIAL (CANCER CENTER ONLY)
BASOS PCT: 1 %
Basophils Absolute: 0 10*3/uL (ref 0.0–0.1)
EOS ABS: 0.2 10*3/uL (ref 0.0–0.5)
EOS PCT: 3 %
HCT: 41.6 % (ref 38.4–49.9)
Hemoglobin: 13.7 g/dL (ref 13.0–17.1)
Lymphocytes Relative: 26 %
Lymphs Abs: 1.5 10*3/uL (ref 0.9–3.3)
MCH: 28.3 pg (ref 27.2–33.4)
MCHC: 32.9 g/dL (ref 32.0–36.0)
MCV: 86 fL (ref 79.3–98.0)
Monocytes Absolute: 0.7 10*3/uL (ref 0.1–0.9)
Monocytes Relative: 11 %
NEUTROS PCT: 59 %
Neutro Abs: 3.5 10*3/uL (ref 1.5–6.5)
PLATELETS: 154 10*3/uL (ref 140–400)
RBC: 4.84 MIL/uL (ref 4.20–5.82)
RDW: 14 % (ref 11.0–14.6)
WBC: 5.9 10*3/uL (ref 4.0–10.3)

## 2017-11-05 LAB — CMP (CANCER CENTER ONLY)
ALK PHOS: 82 U/L (ref 38–126)
ALT: 58 U/L — AB (ref 0–44)
AST: 38 U/L (ref 15–41)
Albumin: 3.5 g/dL (ref 3.5–5.0)
Anion gap: 9 (ref 5–15)
BILIRUBIN TOTAL: 1.2 mg/dL (ref 0.3–1.2)
BUN: 12 mg/dL (ref 8–23)
CALCIUM: 8.7 mg/dL — AB (ref 8.9–10.3)
CO2: 25 mmol/L (ref 22–32)
Chloride: 105 mmol/L (ref 98–111)
Creatinine: 0.94 mg/dL (ref 0.61–1.24)
Glucose, Bld: 97 mg/dL (ref 70–99)
Potassium: 3.8 mmol/L (ref 3.5–5.1)
SODIUM: 139 mmol/L (ref 135–145)
Total Protein: 6.8 g/dL (ref 6.5–8.1)

## 2017-11-05 MED ORDER — SODIUM CHLORIDE 0.9 % IV SOLN
Freq: Once | INTRAVENOUS | Status: AC
  Administered 2017-11-05: 12:00:00 via INTRAVENOUS
  Filled 2017-11-05: qty 250

## 2017-11-05 MED ORDER — SODIUM CHLORIDE 0.9 % IV SOLN
200.0000 mg | Freq: Once | INTRAVENOUS | Status: AC
  Administered 2017-11-05: 200 mg via INTRAVENOUS
  Filled 2017-11-05: qty 8

## 2017-11-05 NOTE — Progress Notes (Signed)
[facility] [phone]   [phone]  OFFICE PROGRESS NOTE  Sheryle Hail, PA-C [address] [place] [address]  DIAGNOSIS: Stage IV (T2a,N3,M1c)non-small cell lung cancer, squamous cell carcinoma presented with right upper lobe lung mass in addition to right hilar and mediastinal adenopathy as well as metastatic disease in the medial right thigh diagnosed in November 2018.  PRIOR THERAPY:  1) Palliative radiotherapy to the right lower extremity mass completed April 09, 2017. 2) Systemic chemotherapy with carboplatin for AUC of 5, paclitaxel 175 mg/M2 and Keytruda 200 mg IV every 3 weeks.  First dose March 12, 2017, status post 4 cycles with partial response.  CURRENT THERAPY: Maintenance treatment with single agent Keytruda 200 mg IV every 3 weeks.  First cycle June 11, 2017.  Status post 11 cycles.  INTERVAL HISTORY: Aaron Sellers 67 y.o. male returns to the clinic today for follow-up visit accompanied by his wife.  The patient has no complaints today except for arthralgia mainly in the shoulders.  He denied having any chest pain, shortness of breath, cough or hemoptysis.  He denied having any recent weight loss or night sweats.  He has no nausea, vomiting, diarrhea or constipation.  He denied having any headache or visual changes.  The patient is here today for evaluation before starting cycle #12 of his maintenance treatment with Keytruda.    MEDICAL HISTORY: Past Medical History:  Diagnosis Date  . Arthritis   . Atrial flutter ([place])   . BPH (benign prostatic hypertrophy) with urinary retention   . Cancer ([place])   . Chronic kidney disease    hx of kidney stones  2011  . COPD (chronic obstructive pulmonary disease) ([place])    has been smoking since he was 22 ish--  . High cholesterol   . Hypertension    dx around 2011  . Squamous cell carcinoma of right lung (HCC)     ALLERGIES:  is allergic to ciprofloxacin; adhesive  [tape]; and codeine.  MEDICATIONS:  Current Outpatient Medications  Medication Sig Dispense Refill  . acetaminophen (TYLENOL) 500 MG tablet Take 1,000 mg by mouth every 6 (six) hours as needed for moderate pain.    Marland Kitchen amiodarone (PACERONE) 200 MG tablet Starting 06/13/17, TAKE 200 MG DAILY 90 tablet 2  . atorvastatin (LIPITOR) 10 MG tablet Take 10 mg by mouth every evening.     . Cyanocobalamin (VITAMIN B-12 PO) Take 1 tablet by mouth daily.    . famotidine (PEPCID) 10 MG tablet Take 10 mg by mouth as needed for heartburn or indigestion.    . furosemide (LASIX) 40 MG tablet Take 1 tablet (40 mg total) by mouth daily as needed. 90 tablet 3  . gabapentin (NEURONTIN) 300 MG capsule Take 1 capsule (300 mg total) by mouth 3 (three) times daily. 90 capsule 1  . loratadine (CLARITIN) 10 MG tablet Take 10 mg by mouth daily.    . tamsulosin (FLOMAX) 0.4 MG CAPS capsule Take 0.4 mg by mouth daily.     No current facility-administered medications for this visit.    Facility-Administered Medications Ordered in Other Visits  Medication Dose Route Frequency Provider Last Rate Last Dose  . heparin lock flush 100 unit/mL  500 Units Intracatheter Once PRN Curt Bears, MD      . sodium chloride flush (NS) 0.9 % injection 10 mL  10 mL Intracatheter PRN Curt Bears, MD        SURGICAL HISTORY:  Past Surgical  History:  Procedure Laterality Date  . HERNIA REPAIR     umbilical hernia  05/8411  . INCISION AND DRAINAGE ABSCESS     "down the middle" of his buttocks  2015  . TOTAL KNEE ARTHROPLASTY Left 10/29/2014   Procedure: TOTAL KNEE ARTHROPLASTY;  Surgeon: Dorna Leitz, MD;  Location: [facility];  Service: Orthopedics;  Laterality: Left;    REVIEW OF SYSTEMS:  A comprehensive review of systems was negative except for: Musculoskeletal: positive for arthralgias   PHYSICAL EXAMINATION: General appearance: alert, cooperative and no distress Head: Normocephalic, without obvious abnormality,  atraumatic Neck: no adenopathy, no JVD, supple, symmetrical, trachea midline and thyroid not enlarged, symmetric, no tenderness/mass/nodules Lymph nodes: Cervical, supraclavicular, and axillary nodes normal. Resp: clear to auscultation bilaterally Back: symmetric, no curvature. ROM normal. No CVA tenderness. Cardio: regular rate and rhythm, S1, S2 normal, no murmur, click, rub or gallop GI: soft, non-tender; bowel sounds normal; no masses,  no organomegaly Extremities: extremities normal, atraumatic, no cyanosis or edema  ECOG PERFORMANCE STATUS: 1 - Symptomatic but completely ambulatory  Blood pressure 139/70, pulse (!) 59, temperature (!) 97.5 F (36.4 C), temperature source Oral, resp. rate 18, height 6\' 8"  (2.032 m), weight (!) 308 lb 12.8 oz (140.1 kg), SpO2 97 %.  LABORATORY DATA: Lab Results  Component Value Date   WBC 5.9 11/05/2017   HGB 13.7 11/05/2017   HCT 41.6 11/05/2017   MCV 86.0 11/05/2017   PLT 154 11/05/2017      Chemistry      Component Value Date/Time   NA 139 10/15/2017 1031   NA 137 04/03/2017 1030   K 3.7 10/15/2017 1031   K 4.1 04/03/2017 1030   CL 104 10/15/2017 1031   CO2 27 10/15/2017 1031   CO2 27 04/03/2017 1030   BUN 14 10/15/2017 1031   BUN 15.5 04/03/2017 1030   CREATININE 0.96 10/15/2017 1031   CREATININE 0.8 04/03/2017 1030      Component Value Date/Time   CALCIUM 9.4 10/15/2017 1031   CALCIUM 9.4 04/03/2017 1030   ALKPHOS 83 10/15/2017 1031   ALKPHOS 97 04/03/2017 1030   AST 34 10/15/2017 1031   AST 14 04/03/2017 1030   ALT 46 (H) 10/15/2017 1031   ALT 21 04/03/2017 1030   BILITOT 0.9 10/15/2017 1031   BILITOT 0.67 04/03/2017 1030       RADIOGRAPHIC STUDIES: No results found.  ASSESSMENT AND PLAN: This is a very pleasant 67 years old white male with a stage IV non-small cell lung cancer, squamous cell carcinoma.  The patient is currently undergoing systemic chemotherapy with carboplatin, paclitaxel and Keytruda status post 4  cycles with partial response. He is currently undergoing maintenance treatment with single agent Keytruda status post 11 cycles. The patient tolerated the last cycle of his treatment well with no concerning adverse effects.  I recommended for him to proceed with cycle #12 today as scheduled. I will see him back for follow-up visit in 3 weeks for evaluation after repeating CT scan of the chest, abdomen and pelvis for restaging of his disease. The patient was advised to call immediately if he has any concerning symptoms in the interval. The patient voices understanding of current disease status and treatment options and is in agreement with the current care plan. All questions were answered. The patient knows to call the clinic with any problems, questions or concerns. We can certainly see the patient much sooner if necessary.  Disclaimer: This note was dictated with voice recognition software.  Similar sounding words can inadvertently be transcribed and may not be corrected upon review.

## 2017-11-05 NOTE — Patient Instructions (Signed)
[facility] Discharge Instructions for Patients Receiving Chemotherapy  Today you received the following chemotherapy agents :  Keytruda.  To help prevent nausea and vomiting after your treatment, we encourage you to take your nausea medication as prescribed.   If you develop nausea and vomiting that is not controlled by your nausea medication, call the clinic.   BELOW ARE SYMPTOMS THAT SHOULD BE REPORTED IMMEDIATELY:  *FEVER GREATER THAN 100.5 F  *CHILLS WITH OR WITHOUT FEVER  NAUSEA AND VOMITING THAT IS NOT CONTROLLED WITH YOUR NAUSEA MEDICATION  *UNUSUAL SHORTNESS OF BREATH  *UNUSUAL BRUISING OR BLEEDING  TENDERNESS IN MOUTH AND THROAT WITH OR WITHOUT PRESENCE OF ULCERS  *URINARY PROBLEMS  *BOWEL PROBLEMS  UNUSUAL RASH Items with * indicate a potential emergency and should be followed up as soon as possible.  Feel free to call the clinic should you have any questions or concerns. The clinic phone number is [phone].  Please show the CHEMO ALERT CARD at check-in to the Emergency Department and triage nurse.  

## 2017-11-05 NOTE — Telephone Encounter (Signed)
PRINTED AVS AND CALENDER OF UPCOMING APPOINTMENT. PER 8/6 LOS  Also gave patient CT information with contact number

## 2017-11-07 ENCOUNTER — Telehealth: Payer: Self-pay | Admitting: Internal Medicine

## 2017-11-07 NOTE — Telephone Encounter (Signed)
Faxed medical records to [facility] at [phone], Release ID: [id number]

## 2017-11-25 ENCOUNTER — Other Ambulatory Visit: Payer: Self-pay | Admitting: Internal Medicine

## 2017-11-25 ENCOUNTER — Ambulatory Visit (HOSPITAL_COMMUNITY)
Admission: RE | Admit: 2017-11-25 | Discharge: 2017-11-25 | Disposition: A | Discharge status: Home / Self Care | Payer: Medicare Other | Source: Ambulatory Visit | Attending: Internal Medicine | Admitting: Internal Medicine

## 2017-11-25 ENCOUNTER — Encounter (HOSPITAL_COMMUNITY): Payer: Self-pay

## 2017-11-25 DIAGNOSIS — D3501 Benign neoplasm of right adrenal gland: Secondary | ICD-10-CM | POA: Insufficient documentation

## 2017-11-25 DIAGNOSIS — R590 Localized enlarged lymph nodes: Secondary | ICD-10-CM | POA: Diagnosis not present

## 2017-11-25 DIAGNOSIS — I2510 Atherosclerotic heart disease of native coronary artery without angina pectoris: Secondary | ICD-10-CM | POA: Insufficient documentation

## 2017-11-25 DIAGNOSIS — I700 Atherosclerosis of aorta: Secondary | ICD-10-CM | POA: Insufficient documentation

## 2017-11-25 DIAGNOSIS — J439 Emphysema, unspecified: Secondary | ICD-10-CM | POA: Diagnosis not present

## 2017-11-25 DIAGNOSIS — C3490 Malignant neoplasm of unspecified part of unspecified bronchus or lung: Secondary | ICD-10-CM | POA: Diagnosis not present

## 2017-11-25 DIAGNOSIS — G6289 Other specified polyneuropathies: Secondary | ICD-10-CM

## 2017-11-25 MED ORDER — IOHEXOL 300 MG/ML  SOLN
100.0000 mL | Freq: Once | INTRAMUSCULAR | Status: AC | PRN
  Administered 2017-11-25: 100 mL via INTRAVENOUS

## 2017-11-26 ENCOUNTER — Encounter: Payer: Self-pay | Admitting: Internal Medicine

## 2017-11-26 ENCOUNTER — Inpatient Hospital Stay: Payer: Medicare Other

## 2017-11-26 ENCOUNTER — Inpatient Hospital Stay (HOSPITAL_BASED_OUTPATIENT_CLINIC_OR_DEPARTMENT_OTHER): Payer: Medicare Other | Admitting: Internal Medicine

## 2017-11-26 VITALS — BP 151/90 | HR 63 | Temp 97.8°F | Resp 18 | Wt 305.0 lb

## 2017-11-26 DIAGNOSIS — Z923 Personal history of irradiation: Secondary | ICD-10-CM

## 2017-11-26 DIAGNOSIS — C7951 Secondary malignant neoplasm of bone: Secondary | ICD-10-CM

## 2017-11-26 DIAGNOSIS — C3491 Malignant neoplasm of unspecified part of right bronchus or lung: Secondary | ICD-10-CM

## 2017-11-26 DIAGNOSIS — G629 Polyneuropathy, unspecified: Secondary | ICD-10-CM

## 2017-11-26 DIAGNOSIS — Z5112 Encounter for antineoplastic immunotherapy: Secondary | ICD-10-CM

## 2017-11-26 DIAGNOSIS — Z9221 Personal history of antineoplastic chemotherapy: Secondary | ICD-10-CM | POA: Diagnosis not present

## 2017-11-26 DIAGNOSIS — Z79899 Other long term (current) drug therapy: Secondary | ICD-10-CM

## 2017-11-26 DIAGNOSIS — I4892 Unspecified atrial flutter: Secondary | ICD-10-CM

## 2017-11-26 DIAGNOSIS — E7800 Pure hypercholesterolemia, unspecified: Secondary | ICD-10-CM

## 2017-11-26 DIAGNOSIS — I129 Hypertensive chronic kidney disease with stage 1 through stage 4 chronic kidney disease, or unspecified chronic kidney disease: Secondary | ICD-10-CM

## 2017-11-26 DIAGNOSIS — I10 Essential (primary) hypertension: Secondary | ICD-10-CM

## 2017-11-26 DIAGNOSIS — N400 Enlarged prostate without lower urinary tract symptoms: Secondary | ICD-10-CM

## 2017-11-26 DIAGNOSIS — C3411 Malignant neoplasm of upper lobe, right bronchus or lung: Secondary | ICD-10-CM | POA: Diagnosis not present

## 2017-11-26 DIAGNOSIS — R5383 Other fatigue: Secondary | ICD-10-CM

## 2017-11-26 DIAGNOSIS — F17210 Nicotine dependence, cigarettes, uncomplicated: Secondary | ICD-10-CM

## 2017-11-26 DIAGNOSIS — J449 Chronic obstructive pulmonary disease, unspecified: Secondary | ICD-10-CM

## 2017-11-26 DIAGNOSIS — N189 Chronic kidney disease, unspecified: Secondary | ICD-10-CM

## 2017-11-26 DIAGNOSIS — G6289 Other specified polyneuropathies: Secondary | ICD-10-CM

## 2017-11-26 LAB — CMP (CANCER CENTER ONLY)
ALT: 59 U/L — AB (ref 0–44)
ANION GAP: 8 (ref 5–15)
AST: 45 U/L — ABNORMAL HIGH (ref 15–41)
Albumin: 3.7 g/dL (ref 3.5–5.0)
Alkaline Phosphatase: 90 U/L (ref 38–126)
BUN: 12 mg/dL (ref 8–23)
CHLORIDE: 105 mmol/L (ref 98–111)
CO2: 28 mmol/L (ref 22–32)
CREATININE: 1.13 mg/dL (ref 0.61–1.24)
Calcium: 9.5 mg/dL (ref 8.9–10.3)
GFR, Estimated: >60 mL/min (ref >60)
Glucose, Bld: 127 mg/dL — ABNORMAL HIGH (ref 70–99)
POTASSIUM: 3.8 mmol/L (ref 3.5–5.1)
SODIUM: 141 mmol/L (ref 135–145)
Total Bilirubin: 0.9 mg/dL (ref 0.3–1.2)
Total Protein: 6.9 g/dL (ref 6.5–8.1)

## 2017-11-26 LAB — CBC WITH DIFFERENTIAL (CANCER CENTER ONLY)
BASOS PCT: 1 %
Basophils Absolute: 0.1 10*3/uL (ref 0.0–0.1)
EOS ABS: 0.2 10*3/uL (ref 0.0–0.5)
Eosinophils Relative: 3 %
HCT: 41.7 % (ref 38.4–49.9)
Hemoglobin: 13.8 g/dL (ref 13.0–17.1)
LYMPHS ABS: 1.4 10*3/uL (ref 0.9–3.3)
Lymphocytes Relative: 27 %
MCH: 28.5 pg (ref 27.2–33.4)
MCHC: 33.1 g/dL (ref 32.0–36.0)
MCV: 86 fL (ref 79.3–98.0)
MONOS PCT: 12 %
Monocytes Absolute: 0.6 10*3/uL (ref 0.1–0.9)
NEUTROS ABS: 3.1 10*3/uL (ref 1.5–6.5)
NEUTROS PCT: 57 %
PLATELETS: 163 10*3/uL (ref 140–400)
RBC: 4.85 MIL/uL (ref 4.20–5.82)
RDW: 14.1 % (ref 11.0–14.6)
WBC: 5.3 10*3/uL (ref 4.0–10.3)

## 2017-11-26 MED ORDER — SODIUM CHLORIDE 0.9 % IV SOLN
200.0000 mg | Freq: Once | INTRAVENOUS | Status: AC
  Administered 2017-11-26: 200 mg via INTRAVENOUS
  Filled 2017-11-26: qty 8

## 2017-11-26 MED ORDER — SODIUM CHLORIDE 0.9 % IV SOLN
Freq: Once | INTRAVENOUS | Status: AC
  Administered 2017-11-26: 12:00:00 via INTRAVENOUS
  Filled 2017-11-26: qty 250

## 2017-11-26 NOTE — Progress Notes (Signed)
[facility] Telephone:(336) [phone]   Fax:([phone]  OFFICE PROGRESS NOTE  Sheryle Hail, PA-C [address] [place] [address]  DIAGNOSIS: Stage IV (T2a,N3,M1c)non-small cell lung cancer, squamous cell carcinoma presented with right upper lobe lung mass in addition to right hilar and mediastinal adenopathy as well as metastatic disease in the medial right thigh diagnosed in November 2018.  PRIOR THERAPY:  1) Palliative radiotherapy to the right lower extremity mass completed April 09, 2017. 2) Systemic chemotherapy with carboplatin for AUC of 5, paclitaxel 175 mg/M2 and Keytruda 200 mg IV every 3 weeks.  First dose March 12, 2017, status post 4 cycles with partial response.  CURRENT THERAPY: Maintenance treatment with single agent Keytruda 200 mg IV every 3 weeks.  First cycle June 11, 2017.  Status post 12 cycles.  INTERVAL HISTORY: Aaron Sellers 67 y.o. male returns to the clinic today for follow-up visit accompanied by his wife.  The patient is feeling fine today with no specific complaints except for mild fatigue.  He denied having any chest pain but has shortness of breath with exertion with no cough or hemoptysis.  He denied having any nausea, vomiting, diarrhea or constipation.  He lost few pounds since his last visit.  He denied having any headache or visual changes.  He continues to tolerate his treatment with maintenance Keytruda fairly well.  The patient had repeat CT scan of the chest, abdomen and pelvis performed recently and he is here for evaluation and discussion of his discuss results.   MEDICAL HISTORY: Past Medical History:  Diagnosis Date  . Arthritis   . Atrial flutter ([place])   . BPH (benign prostatic hypertrophy) with urinary retention   . Cancer ([place])   . Chronic kidney disease    hx of kidney stones  2011  . COPD (chronic obstructive pulmonary disease) ([facility])    has been smoking since he was 22 ish--  . High  cholesterol   . Hypertension    dx around 2011  . Squamous cell carcinoma of right lung (HCC)     ALLERGIES:  is allergic to ciprofloxacin; adhesive [tape]; and codeine.  MEDICATIONS:  Current Outpatient Medications  Medication Sig Dispense Refill  . acetaminophen (TYLENOL) 500 MG tablet Take 1,000 mg by mouth every 6 (six) hours as needed for moderate pain.    Marland Kitchen amiodarone (PACERONE) 200 MG tablet Starting 06/13/17, TAKE 200 MG DAILY 90 tablet 2  . atorvastatin (LIPITOR) 10 MG tablet Take 10 mg by mouth every evening.     . Cyanocobalamin (VITAMIN B-12 PO) Take 1 tablet by mouth daily.    . famotidine (PEPCID) 10 MG tablet Take 10 mg by mouth as needed for heartburn or indigestion.    . furosemide (LASIX) 40 MG tablet Take 1 tablet (40 mg total) by mouth daily as needed. 90 tablet 3  . gabapentin (NEURONTIN) 300 MG capsule TAKE 1 CAPSULE BY MOUTH THREE TIMES DAILY 90 capsule 0  . loratadine (CLARITIN) 10 MG tablet Take 10 mg by mouth daily.    . tamsulosin (FLOMAX) 0.4 MG CAPS capsule Take 0.4 mg by mouth daily.     No current facility-administered medications for this visit.    Facility-Administered Medications Ordered in Other Visits  Medication Dose Route Frequency Provider Last Rate Last Dose  . heparin lock flush 100 unit/mL  500 Units Intracatheter Once PRN Curt Bears, MD      . sodium chloride flush (NS) 0.9 % injection  10 mL  10 mL Intracatheter PRN Curt Bears, MD        SURGICAL HISTORY:  Past Surgical History:  Procedure Laterality Date  . HERNIA REPAIR     umbilical hernia  07/6657  . INCISION AND DRAINAGE ABSCESS     "down the middle" of his buttocks  2015  . TOTAL KNEE ARTHROPLASTY Left 10/29/2014   Procedure: TOTAL KNEE ARTHROPLASTY;  Surgeon: Dorna Leitz, MD;  Location: [facility];  Service: Orthopedics;  Laterality: Left;    REVIEW OF SYSTEMS:  Constitutional: positive for fatigue Eyes: negative Ears, nose, mouth, throat, and face: negative Respiratory:  positive for dyspnea on exertion Cardiovascular: negative Gastrointestinal: negative Genitourinary:negative Integument/breast: negative Hematologic/lymphatic: negative Musculoskeletal:negative Neurological: negative Behavioral/Psych: negative Endocrine: negative Allergic/Immunologic: negative   PHYSICAL EXAMINATION: General appearance: alert, cooperative and no distress Head: Normocephalic, without obvious abnormality, atraumatic Neck: no adenopathy, no JVD, supple, symmetrical, trachea midline and thyroid not enlarged, symmetric, no tenderness/mass/nodules Lymph nodes: Cervical, supraclavicular, and axillary nodes normal. Resp: clear to auscultation bilaterally Back: symmetric, no curvature. ROM normal. No CVA tenderness. Cardio: regular rate and rhythm, S1, S2 normal, no murmur, click, rub or gallop GI: soft, non-tender; bowel sounds normal; no masses,  no organomegaly Extremities: extremities normal, atraumatic, no cyanosis or edema Neurologic: Alert and oriented X 3, normal strength and tone. Normal symmetric reflexes. Normal coordination and gait  ECOG PERFORMANCE STATUS: 1 - Symptomatic but completely ambulatory  Blood pressure (!) 151/90, pulse 63, temperature 97.8 F (36.6 C), temperature source Oral, resp. rate 18, weight (!) 305 lb (138.3 kg), SpO2 98 %.  LABORATORY DATA: Lab Results  Component Value Date   WBC 5.3 11/26/2017   HGB 13.8 11/26/2017   HCT 41.7 11/26/2017   MCV 86.0 11/26/2017   PLT 163 11/26/2017      Chemistry      Component Value Date/Time   NA 141 11/26/2017 1044   NA 137 04/03/2017 1030   K 3.8 11/26/2017 1044   K 4.1 04/03/2017 1030   CL 105 11/26/2017 1044   CO2 28 11/26/2017 1044   CO2 27 04/03/2017 1030   BUN 12 11/26/2017 1044   BUN 15.5 04/03/2017 1030   CREATININE 1.13 11/26/2017 1044   CREATININE 0.8 04/03/2017 1030      Component Value Date/Time   CALCIUM 9.5 11/26/2017 1044   CALCIUM 9.4 04/03/2017 1030   ALKPHOS 90  11/26/2017 1044   ALKPHOS 97 04/03/2017 1030   AST 45 (H) 11/26/2017 1044   AST 14 04/03/2017 1030   ALT 59 (H) 11/26/2017 1044   ALT 21 04/03/2017 1030   BILITOT 0.9 11/26/2017 1044   BILITOT 0.67 04/03/2017 1030       RADIOGRAPHIC STUDIES: Ct Chest W Contrast  Result Date: 11/25/2017 CLINICAL DATA:  Non-small cell lung cancer EXAM: CT CHEST, ABDOMEN, AND PELVIS WITH CONTRAST TECHNIQUE: Multidetector CT imaging of the chest, abdomen and pelvis was performed following the standard protocol during bolus administration of intravenous contrast. CONTRAST:  113mL OMNIPAQUE IOHEXOL 300 MG/ML  SOLN COMPARISON:  09/20/2017 FINDINGS: CT CHEST FINDINGS Cardiovascular: Normal heart size. No pericardial effusion. Aortic atherosclerosis. Calcification within the LAD and RCA coronary artery noted. Mediastinum/Nodes: Normal appearance of the thyroid gland. The trachea appears patent and is midline. Normal appearance of the esophagus. The index left axillary lymph node measures 1.7 cm, image 15/2. Unchanged. No right axillary adenopathy. No enlarged mediastinal or hilar adenopathy. The index subcarinal lymph node measures 1.1 cm, image 31/2. Previously 1.2 cm. Lungs/Pleura: No  pleural effusion. The previous cavitary nodule within the right upper lobe measures 1.5 x 1.0 cm, image 47/6. Previously 1.7 x 1.1 cm. Other small pulmonary nodules remain unchanged. Mild changes of emphysema. Mild central airway and septal thickening is again noted. Musculoskeletal: No chest wall mass or suspicious bone lesions identified. CT ABDOMEN PELVIS FINDINGS Hepatobiliary: No focal liver abnormality is seen. No gallstones, gallbladder wall thickening, or biliary dilatation. Pancreas: Unremarkable. No pancreatic ductal dilatation or surrounding inflammatory changes. Spleen: Normal in size without focal abnormality. Adrenals/Urinary Tract: Left adrenal gland is normal. Right adrenal gland adenoma is stable measuring 1.9 by 3.0 cm, image  63/2. Similar appearance of right interpolar kidney cysts measuring 2 cm. Nonobstructive calculus within the midpole of right kidney measures 2-3 mm. No hydronephrosis identified bilaterally. Urinary bladder appears normal. Stomach/Bowel: Stomach is within normal limits. Appendix appears normal. No evidence of bowel wall thickening, distention, or inflammatory changes. Distal colonic diverticulosis noted. Vascular/Lymphatic: Aortic atherosclerosis. Infrarenal abdominal aortic aneurysm is stable measuring 3 cm. Prominent peripancreatic and portacaval node are unchanged measuring up to 1.7 cm, image 71/2. Stable left abdominal venous collaterals. Reproductive: Prostate is unremarkable. Other: No abdominal wall hernia or abnormality. No abdominopelvic ascites. Musculoskeletal: Spondylosis identified within the lumbar spine. No aggressive lytic or sclerotic bone lesions. IMPRESSION: 1. Continued decrease in size of right upper lobe lung lesion. Additional small pulmonary nodules bilaterally are unchanged. 2. Persistent but stable appearance of moderately enlarged left axillary lymph node. This could be sampled under ultrasound guidance if clinically warranted. 3. Prominent upper abdominal lymph nodes are stable. 4.  Aortic Atherosclerosis (ICD10-I70.0). 5.  Emphysema (ICD10-J43.9). 6. Coronary artery atherosclerotic calcifications 7. Stable right adrenal gland adenoma. Electronically Signed   By: Kerby Moors M.D.   On: 11/25/2017 15:43   Ct Abdomen Pelvis W Contrast  Result Date: 11/25/2017 CLINICAL DATA:  Non-small cell lung cancer EXAM: CT CHEST, ABDOMEN, AND PELVIS WITH CONTRAST TECHNIQUE: Multidetector CT imaging of the chest, abdomen and pelvis was performed following the standard protocol during bolus administration of intravenous contrast. CONTRAST:  180mL OMNIPAQUE IOHEXOL 300 MG/ML  SOLN COMPARISON:  09/20/2017 FINDINGS: CT CHEST FINDINGS Cardiovascular: Normal heart size. No pericardial effusion. Aortic  atherosclerosis. Calcification within the LAD and RCA coronary artery noted. Mediastinum/Nodes: Normal appearance of the thyroid gland. The trachea appears patent and is midline. Normal appearance of the esophagus. The index left axillary lymph node measures 1.7 cm, image 15/2. Unchanged. No right axillary adenopathy. No enlarged mediastinal or hilar adenopathy. The index subcarinal lymph node measures 1.1 cm, image 31/2. Previously 1.2 cm. Lungs/Pleura: No pleural effusion. The previous cavitary nodule within the right upper lobe measures 1.5 x 1.0 cm, image 47/6. Previously 1.7 x 1.1 cm. Other small pulmonary nodules remain unchanged. Mild changes of emphysema. Mild central airway and septal thickening is again noted. Musculoskeletal: No chest wall mass or suspicious bone lesions identified. CT ABDOMEN PELVIS FINDINGS Hepatobiliary: No focal liver abnormality is seen. No gallstones, gallbladder wall thickening, or biliary dilatation. Pancreas: Unremarkable. No pancreatic ductal dilatation or surrounding inflammatory changes. Spleen: Normal in size without focal abnormality. Adrenals/Urinary Tract: Left adrenal gland is normal. Right adrenal gland adenoma is stable measuring 1.9 by 3.0 cm, image 63/2. Similar appearance of right interpolar kidney cysts measuring 2 cm. Nonobstructive calculus within the midpole of right kidney measures 2-3 mm. No hydronephrosis identified bilaterally. Urinary bladder appears normal. Stomach/Bowel: Stomach is within normal limits. Appendix appears normal. No evidence of bowel wall thickening, distention, or inflammatory changes. Distal colonic  diverticulosis noted. Vascular/Lymphatic: Aortic atherosclerosis. Infrarenal abdominal aortic aneurysm is stable measuring 3 cm. Prominent peripancreatic and portacaval node are unchanged measuring up to 1.7 cm, image 71/2. Stable left abdominal venous collaterals. Reproductive: Prostate is unremarkable. Other: No abdominal wall hernia or  abnormality. No abdominopelvic ascites. Musculoskeletal: Spondylosis identified within the lumbar spine. No aggressive lytic or sclerotic bone lesions. IMPRESSION: 1. Continued decrease in size of right upper lobe lung lesion. Additional small pulmonary nodules bilaterally are unchanged. 2. Persistent but stable appearance of moderately enlarged left axillary lymph node. This could be sampled under ultrasound guidance if clinically warranted. 3. Prominent upper abdominal lymph nodes are stable. 4.  Aortic Atherosclerosis (ICD10-I70.0). 5.  Emphysema (ICD10-J43.9). 6. Coronary artery atherosclerotic calcifications 7. Stable right adrenal gland adenoma. Electronically Signed   By: Kerby Moors M.D.   On: 11/25/2017 15:43    ASSESSMENT AND PLAN: This is a very pleasant 67 years old white male with a stage IV non-small cell lung cancer, squamous cell carcinoma.  The patient is currently undergoing systemic chemotherapy with carboplatin, paclitaxel and Keytruda status post 4 cycles with partial response. He is currently undergoing maintenance treatment with single agent Keytruda status post 12 cycles. He continues to tolerate his treatment well with no concerning adverse effects. Repeat CT scan of the chest, abdomen and pelvis showed no concerning findings for disease progression.  I personally and independently reviewed the scan and discussed the results with the patient and his wife. I recommended for the patient to proceed with cycle #13 today as scheduled. I will see him back for follow-up visit in 3 weeks for evaluation before starting cycle #14. For hypertension, the patient was advised to continue his current blood pressure medication and to monitor it closely at home. For the peripheral neuropathy, he will continue on gabapentin and I will give him a refill of his medication today. He was advised to call immediately if he has any concerning symptoms in the interval. The patient voices understanding  of current disease status and treatment options and is in agreement with the current care plan. All questions were answered. The patient knows to call the clinic with any problems, questions or concerns. We can certainly see the patient much sooner if necessary.  Disclaimer: This note was dictated with voice recognition software. Similar sounding words can inadvertently be transcribed and may not be corrected upon review.

## 2017-11-26 NOTE — Patient Instructions (Signed)
[facility] Discharge Instructions for Patients Receiving Chemotherapy  Today you received the following chemotherapy agents Beryle Flock   To help prevent nausea and vomiting after your treatment, we encourage you to take your nausea medication as directed  If you develop nausea and vomiting that is not controlled by your nausea medication, call the clinic.   BELOW ARE SYMPTOMS THAT SHOULD BE REPORTED IMMEDIATELY:  *FEVER GREATER THAN 100.5 F  *CHILLS WITH OR WITHOUT FEVER  NAUSEA AND VOMITING THAT IS NOT CONTROLLED WITH YOUR NAUSEA MEDICATION  *UNUSUAL SHORTNESS OF BREATH  *UNUSUAL BRUISING OR BLEEDING  TENDERNESS IN MOUTH AND THROAT WITH OR WITHOUT PRESENCE OF ULCERS  *URINARY PROBLEMS  *BOWEL PROBLEMS  UNUSUAL RASH Items with * indicate a potential emergency and should be followed up as soon as possible.  Feel free to call the clinic you have any questions or concerns. The clinic phone number is [phone].

## 2017-12-03 ENCOUNTER — Other Ambulatory Visit: Payer: Self-pay | Admitting: Internal Medicine

## 2017-12-03 DIAGNOSIS — G6289 Other specified polyneuropathies: Secondary | ICD-10-CM

## 2017-12-04 ENCOUNTER — Other Ambulatory Visit: Payer: Self-pay | Admitting: Medical Oncology

## 2017-12-04 ENCOUNTER — Other Ambulatory Visit: Payer: Self-pay | Admitting: Internal Medicine

## 2017-12-04 DIAGNOSIS — G6289 Other specified polyneuropathies: Secondary | ICD-10-CM

## 2017-12-04 MED ORDER — GABAPENTIN 300 MG PO CAPS: 300.0000 mg | ORAL_CAPSULE | Freq: Three times a day (TID) | ORAL | 0 refills | Status: DC

## 2017-12-05 ENCOUNTER — Other Ambulatory Visit: Payer: Self-pay | Admitting: Internal Medicine

## 2017-12-05 DIAGNOSIS — G6289 Other specified polyneuropathies: Secondary | ICD-10-CM

## 2017-12-05 NOTE — Telephone Encounter (Addendum)
Gabapentin now controlled substance in [place] refills must be called in or faxed. Cannot go electronically thorough [place] escribe line. I called in refill.

## 2017-12-17 ENCOUNTER — Inpatient Hospital Stay (HOSPITAL_BASED_OUTPATIENT_CLINIC_OR_DEPARTMENT_OTHER): Payer: Medicare Other | Admitting: Internal Medicine

## 2017-12-17 ENCOUNTER — Inpatient Hospital Stay: Payer: Medicare Other

## 2017-12-17 ENCOUNTER — Inpatient Hospital Stay: Payer: Medicare Other | Attending: Oncology

## 2017-12-17 ENCOUNTER — Encounter: Payer: Self-pay | Admitting: Internal Medicine

## 2017-12-17 VITALS — BP 150/81 | HR 62 | Temp 97.6°F | Resp 18 | Ht >= 80 in | Wt 304.8 lb

## 2017-12-17 DIAGNOSIS — G629 Polyneuropathy, unspecified: Secondary | ICD-10-CM

## 2017-12-17 DIAGNOSIS — C3411 Malignant neoplasm of upper lobe, right bronchus or lung: Secondary | ICD-10-CM | POA: Diagnosis present

## 2017-12-17 DIAGNOSIS — Z5112 Encounter for antineoplastic immunotherapy: Secondary | ICD-10-CM | POA: Insufficient documentation

## 2017-12-17 DIAGNOSIS — Z9221 Personal history of antineoplastic chemotherapy: Secondary | ICD-10-CM | POA: Insufficient documentation

## 2017-12-17 DIAGNOSIS — D3501 Benign neoplasm of right adrenal gland: Secondary | ICD-10-CM | POA: Insufficient documentation

## 2017-12-17 DIAGNOSIS — J449 Chronic obstructive pulmonary disease, unspecified: Secondary | ICD-10-CM | POA: Insufficient documentation

## 2017-12-17 DIAGNOSIS — Z87442 Personal history of urinary calculi: Secondary | ICD-10-CM

## 2017-12-17 DIAGNOSIS — N189 Chronic kidney disease, unspecified: Secondary | ICD-10-CM | POA: Diagnosis not present

## 2017-12-17 DIAGNOSIS — C7951 Secondary malignant neoplasm of bone: Secondary | ICD-10-CM | POA: Insufficient documentation

## 2017-12-17 DIAGNOSIS — C3491 Malignant neoplasm of unspecified part of right bronchus or lung: Secondary | ICD-10-CM

## 2017-12-17 DIAGNOSIS — I129 Hypertensive chronic kidney disease with stage 1 through stage 4 chronic kidney disease, or unspecified chronic kidney disease: Secondary | ICD-10-CM | POA: Diagnosis not present

## 2017-12-17 DIAGNOSIS — E7800 Pure hypercholesterolemia, unspecified: Secondary | ICD-10-CM

## 2017-12-17 DIAGNOSIS — I4892 Unspecified atrial flutter: Secondary | ICD-10-CM | POA: Insufficient documentation

## 2017-12-17 DIAGNOSIS — Z923 Personal history of irradiation: Secondary | ICD-10-CM | POA: Insufficient documentation

## 2017-12-17 DIAGNOSIS — Z79899 Other long term (current) drug therapy: Secondary | ICD-10-CM | POA: Diagnosis not present

## 2017-12-17 DIAGNOSIS — M25512 Pain in left shoulder: Secondary | ICD-10-CM | POA: Diagnosis not present

## 2017-12-17 DIAGNOSIS — F17210 Nicotine dependence, cigarettes, uncomplicated: Secondary | ICD-10-CM | POA: Diagnosis not present

## 2017-12-17 LAB — CMP (CANCER CENTER ONLY)
ALT: 55 U/L — AB (ref 0–44)
AST: 43 U/L — AB (ref 15–41)
Albumin: 3.5 g/dL (ref 3.5–5.0)
Alkaline Phosphatase: 95 U/L (ref 38–126)
Anion gap: 7 (ref 5–15)
BUN: 12 mg/dL (ref 8–23)
CHLORIDE: 105 mmol/L (ref 98–111)
CO2: 27 mmol/L (ref 22–32)
CREATININE: 1.04 mg/dL (ref 0.61–1.24)
Calcium: 9.2 mg/dL (ref 8.9–10.3)
GFR, Est AFR Am: >60 mL/min (ref >60)
GFR, Estimated: >60 mL/min (ref >60)
Glucose, Bld: 148 mg/dL — ABNORMAL HIGH (ref 70–99)
Potassium: 3.6 mmol/L (ref 3.5–5.1)
Sodium: 139 mmol/L (ref 135–145)
Total Bilirubin: 1 mg/dL (ref 0.3–1.2)
Total Protein: 6.9 g/dL (ref 6.5–8.1)

## 2017-12-17 LAB — CBC WITH DIFFERENTIAL (CANCER CENTER ONLY)
Basophils Absolute: 0.1 10*3/uL (ref 0.0–0.1)
Basophils Relative: 1 %
EOS ABS: 0.2 10*3/uL (ref 0.0–0.5)
EOS PCT: 4 %
HCT: 41 % (ref 38.4–49.9)
HEMOGLOBIN: 13.6 g/dL (ref 13.0–17.1)
LYMPHS ABS: 1.5 10*3/uL (ref 0.9–3.3)
LYMPHS PCT: 34 %
MCH: 28.5 pg (ref 27.2–33.4)
MCHC: 33.2 g/dL (ref 32.0–36.0)
MCV: 85.8 fL (ref 79.3–98.0)
MONOS PCT: 11 %
Monocytes Absolute: 0.5 10*3/uL (ref 0.1–0.9)
Neutro Abs: 2.3 10*3/uL (ref 1.5–6.5)
Neutrophils Relative %: 50 %
PLATELETS: 161 10*3/uL (ref 140–400)
RBC: 4.78 MIL/uL (ref 4.20–5.82)
RDW: 14.1 % (ref 11.0–14.6)
WBC Count: 4.6 10*3/uL (ref 4.0–10.3)

## 2017-12-17 MED ORDER — SODIUM CHLORIDE 0.9 % IV SOLN
Freq: Once | INTRAVENOUS | Status: AC
  Administered 2017-12-17: 12:00:00 via INTRAVENOUS
  Filled 2017-12-17: qty 250

## 2017-12-17 MED ORDER — SODIUM CHLORIDE 0.9 % IV SOLN
200.0000 mg | Freq: Once | INTRAVENOUS | Status: AC
  Administered 2017-12-17: 200 mg via INTRAVENOUS
  Filled 2017-12-17: qty 8

## 2017-12-17 NOTE — Patient Instructions (Signed)
[facility] Discharge Instructions for Patients Receiving Chemotherapy  Today you received the following chemotherapy agents Beryle Flock   To help prevent nausea and vomiting after your treatment, we encourage you to take your nausea medication as directed  If you develop nausea and vomiting that is not controlled by your nausea medication, call the clinic.   BELOW ARE SYMPTOMS THAT SHOULD BE REPORTED IMMEDIATELY:  *FEVER GREATER THAN 100.5 F  *CHILLS WITH OR WITHOUT FEVER  NAUSEA AND VOMITING THAT IS NOT CONTROLLED WITH YOUR NAUSEA MEDICATION  *UNUSUAL SHORTNESS OF BREATH  *UNUSUAL BRUISING OR BLEEDING  TENDERNESS IN MOUTH AND THROAT WITH OR WITHOUT PRESENCE OF ULCERS  *URINARY PROBLEMS  *BOWEL PROBLEMS  UNUSUAL RASH Items with * indicate a potential emergency and should be followed up as soon as possible.  Feel free to call the clinic you have any questions or concerns. The clinic phone number is [phone].

## 2017-12-17 NOTE — Progress Notes (Signed)
[facility] [phone]   [phone]  OFFICE PROGRESS NOTE  Sheryle Hail, PA-C [address] [place] [address]  DIAGNOSIS: Stage IV (T2a,N3,M1c)non-small cell lung cancer, squamous cell carcinoma presented with right upper lobe lung mass in addition to right hilar and mediastinal adenopathy as well as metastatic disease in the medial right thigh diagnosed in November 2018.  PRIOR THERAPY:  1) Palliative radiotherapy to the right lower extremity mass completed April 09, 2017. 2) Systemic chemotherapy with carboplatin for AUC of 5, paclitaxel 175 mg/M2 and Keytruda 200 mg IV every 3 weeks.  First dose March 12, 2017, status post 4 cycles with partial response.  CURRENT THERAPY: Maintenance treatment with single agent Keytruda 200 mg IV every 3 weeks.  First cycle June 11, 2017.  Status post 13 cycles.  INTERVAL HISTORY: Aaron Sellers 67 y.o. male returns to the clinic today for follow-up visit accompanied by his wife.  The patient is feeling fine today with no specific complaints except for left shoulder pain.  He denied having any chest pain, shortness of breath, cough or hemoptysis.  He denied having any fever or chills.  He has no nausea, vomiting, diarrhea or constipation.  He continues to tolerate his treatment with Keytruda fairly well.  He is here for evaluation before starting cycle #14.   MEDICAL HISTORY: Past Medical History:  Diagnosis Date  . Arthritis   . Atrial flutter ([facility])   . BPH (benign prostatic hypertrophy) with urinary retention   . Cancer ([facility])   . Chronic kidney disease    hx of kidney stones  2011  . COPD (chronic obstructive pulmonary disease) ([facility])    has been smoking since he was 22 ish--  . High cholesterol   . Hypertension    dx around 2011  . Squamous cell carcinoma of right lung (HCC)     ALLERGIES:  is allergic to ciprofloxacin; adhesive [tape]; and codeine.  MEDICATIONS:  Current  Outpatient Medications  Medication Sig Dispense Refill  . acetaminophen (TYLENOL) 500 MG tablet Take 1,000 mg by mouth every 6 (six) hours as needed for moderate pain.    Marland Kitchen amiodarone (PACERONE) 200 MG tablet Starting 06/13/17, TAKE 200 MG DAILY 90 tablet 2  . atorvastatin (LIPITOR) 10 MG tablet Take 10 mg by mouth every evening.     . Cyanocobalamin (VITAMIN B-12 PO) Take 1 tablet by mouth daily.    . famotidine (PEPCID) 10 MG tablet Take 10 mg by mouth as needed for heartburn or indigestion.    . furosemide (LASIX) 40 MG tablet Take 1 tablet (40 mg total) by mouth daily as needed. 90 tablet 3  . gabapentin (NEURONTIN) 300 MG capsule TAKE 1 CAPSULE BY MOUTH THREE TIMES DAILY 90 capsule 0  . loratadine (CLARITIN) 10 MG tablet Take 10 mg by mouth daily.    . tamsulosin (FLOMAX) 0.4 MG CAPS capsule Take 0.4 mg by mouth daily.     No current facility-administered medications for this visit.    Facility-Administered Medications Ordered in Other Visits  Medication Dose Route Frequency Provider Last Rate Last Dose  . heparin lock flush 100 unit/mL  500 Units Intracatheter Once PRN Curt Bears, MD      . sodium chloride flush (NS) 0.9 % injection 10 mL  10 mL Intracatheter PRN Curt Bears, MD        SURGICAL HISTORY:  Past Surgical History:  Procedure Laterality Date  . HERNIA REPAIR  umbilical hernia  08/6211  . INCISION AND DRAINAGE ABSCESS     "down the middle" of his buttocks  2015  . TOTAL KNEE ARTHROPLASTY Left 10/29/2014   Procedure: TOTAL KNEE ARTHROPLASTY;  Surgeon: Dorna Leitz, MD;  Location: [facility];  Service: Orthopedics;  Laterality: Left;    REVIEW OF SYSTEMS:  A comprehensive review of systems was negative except for: Musculoskeletal: positive for arthralgias   PHYSICAL EXAMINATION: General appearance: alert, cooperative and no distress Head: Normocephalic, without obvious abnormality, atraumatic Neck: no adenopathy, no JVD, supple, symmetrical, trachea midline and  thyroid not enlarged, symmetric, no tenderness/mass/nodules Lymph nodes: Cervical, supraclavicular, and axillary nodes normal. Resp: clear to auscultation bilaterally Back: symmetric, no curvature. ROM normal. No CVA tenderness. Cardio: regular rate and rhythm, S1, S2 normal, no murmur, click, rub or gallop GI: soft, non-tender; bowel sounds normal; no masses,  no organomegaly Extremities: extremities normal, atraumatic, no cyanosis or edema  ECOG PERFORMANCE STATUS: 1 - Symptomatic but completely ambulatory  Blood pressure (!) 150/81, pulse 62, temperature 97.6 F (36.4 C), temperature source Oral, resp. rate 18, height 6\' 8"  (2.032 m), weight (!) 304 lb 12.8 oz (138.3 kg), SpO2 100 %.  LABORATORY DATA: Lab Results  Component Value Date   WBC 4.6 12/17/2017   HGB 13.6 12/17/2017   HCT 41.0 12/17/2017   MCV 85.8 12/17/2017   PLT 161 12/17/2017      Chemistry      Component Value Date/Time   NA 141 11/26/2017 1044   NA 137 04/03/2017 1030   K 3.8 11/26/2017 1044   K 4.1 04/03/2017 1030   CL 105 11/26/2017 1044   CO2 28 11/26/2017 1044   CO2 27 04/03/2017 1030   BUN 12 11/26/2017 1044   BUN 15.5 04/03/2017 1030   CREATININE 1.13 11/26/2017 1044   CREATININE 0.8 04/03/2017 1030      Component Value Date/Time   CALCIUM 9.5 11/26/2017 1044   CALCIUM 9.4 04/03/2017 1030   ALKPHOS 90 11/26/2017 1044   ALKPHOS 97 04/03/2017 1030   AST 45 (H) 11/26/2017 1044   AST 14 04/03/2017 1030   ALT 59 (H) 11/26/2017 1044   ALT 21 04/03/2017 1030   BILITOT 0.9 11/26/2017 1044   BILITOT 0.67 04/03/2017 1030       RADIOGRAPHIC STUDIES: Ct Chest W Contrast  Result Date: 11/25/2017 CLINICAL DATA:  Non-small cell lung cancer EXAM: CT CHEST, ABDOMEN, AND PELVIS WITH CONTRAST TECHNIQUE: Multidetector CT imaging of the chest, abdomen and pelvis was performed following the standard protocol during bolus administration of intravenous contrast. CONTRAST:  141mL OMNIPAQUE IOHEXOL 300 MG/ML   SOLN COMPARISON:  09/20/2017 FINDINGS: CT CHEST FINDINGS Cardiovascular: Normal heart size. No pericardial effusion. Aortic atherosclerosis. Calcification within the LAD and RCA coronary artery noted. Mediastinum/Nodes: Normal appearance of the thyroid gland. The trachea appears patent and is midline. Normal appearance of the esophagus. The index left axillary lymph node measures 1.7 cm, image 15/2. Unchanged. No right axillary adenopathy. No enlarged mediastinal or hilar adenopathy. The index subcarinal lymph node measures 1.1 cm, image 31/2. Previously 1.2 cm. Lungs/Pleura: No pleural effusion. The previous cavitary nodule within the right upper lobe measures 1.5 x 1.0 cm, image 47/6. Previously 1.7 x 1.1 cm. Other small pulmonary nodules remain unchanged. Mild changes of emphysema. Mild central airway and septal thickening is again noted. Musculoskeletal: No chest wall mass or suspicious bone lesions identified. CT ABDOMEN PELVIS FINDINGS Hepatobiliary: No focal liver abnormality is seen. No gallstones, gallbladder wall thickening, or  biliary dilatation. Pancreas: Unremarkable. No pancreatic ductal dilatation or surrounding inflammatory changes. Spleen: Normal in size without focal abnormality. Adrenals/Urinary Tract: Left adrenal gland is normal. Right adrenal gland adenoma is stable measuring 1.9 by 3.0 cm, image 63/2. Similar appearance of right interpolar kidney cysts measuring 2 cm. Nonobstructive calculus within the midpole of right kidney measures 2-3 mm. No hydronephrosis identified bilaterally. Urinary bladder appears normal. Stomach/Bowel: Stomach is within normal limits. Appendix appears normal. No evidence of bowel wall thickening, distention, or inflammatory changes. Distal colonic diverticulosis noted. Vascular/Lymphatic: Aortic atherosclerosis. Infrarenal abdominal aortic aneurysm is stable measuring 3 cm. Prominent peripancreatic and portacaval node are unchanged measuring up to 1.7 cm, image  71/2. Stable left abdominal venous collaterals. Reproductive: Prostate is unremarkable. Other: No abdominal wall hernia or abnormality. No abdominopelvic ascites. Musculoskeletal: Spondylosis identified within the lumbar spine. No aggressive lytic or sclerotic bone lesions. IMPRESSION: 1. Continued decrease in size of right upper lobe lung lesion. Additional small pulmonary nodules bilaterally are unchanged. 2. Persistent but stable appearance of moderately enlarged left axillary lymph node. This could be sampled under ultrasound guidance if clinically warranted. 3. Prominent upper abdominal lymph nodes are stable. 4.  Aortic Atherosclerosis (ICD10-I70.0). 5.  Emphysema (ICD10-J43.9). 6. Coronary artery atherosclerotic calcifications 7. Stable right adrenal gland adenoma. Electronically Signed   By: Kerby Moors M.D.   On: 11/25/2017 15:43   Ct Abdomen Pelvis W Contrast  Result Date: 11/25/2017 CLINICAL DATA:  Non-small cell lung cancer EXAM: CT CHEST, ABDOMEN, AND PELVIS WITH CONTRAST TECHNIQUE: Multidetector CT imaging of the chest, abdomen and pelvis was performed following the standard protocol during bolus administration of intravenous contrast. CONTRAST:  127mL OMNIPAQUE IOHEXOL 300 MG/ML  SOLN COMPARISON:  09/20/2017 FINDINGS: CT CHEST FINDINGS Cardiovascular: Normal heart size. No pericardial effusion. Aortic atherosclerosis. Calcification within the LAD and RCA coronary artery noted. Mediastinum/Nodes: Normal appearance of the thyroid gland. The trachea appears patent and is midline. Normal appearance of the esophagus. The index left axillary lymph node measures 1.7 cm, image 15/2. Unchanged. No right axillary adenopathy. No enlarged mediastinal or hilar adenopathy. The index subcarinal lymph node measures 1.1 cm, image 31/2. Previously 1.2 cm. Lungs/Pleura: No pleural effusion. The previous cavitary nodule within the right upper lobe measures 1.5 x 1.0 cm, image 47/6. Previously 1.7 x 1.1 cm. Other  small pulmonary nodules remain unchanged. Mild changes of emphysema. Mild central airway and septal thickening is again noted. Musculoskeletal: No chest wall mass or suspicious bone lesions identified. CT ABDOMEN PELVIS FINDINGS Hepatobiliary: No focal liver abnormality is seen. No gallstones, gallbladder wall thickening, or biliary dilatation. Pancreas: Unremarkable. No pancreatic ductal dilatation or surrounding inflammatory changes. Spleen: Normal in size without focal abnormality. Adrenals/Urinary Tract: Left adrenal gland is normal. Right adrenal gland adenoma is stable measuring 1.9 by 3.0 cm, image 63/2. Similar appearance of right interpolar kidney cysts measuring 2 cm. Nonobstructive calculus within the midpole of right kidney measures 2-3 mm. No hydronephrosis identified bilaterally. Urinary bladder appears normal. Stomach/Bowel: Stomach is within normal limits. Appendix appears normal. No evidence of bowel wall thickening, distention, or inflammatory changes. Distal colonic diverticulosis noted. Vascular/Lymphatic: Aortic atherosclerosis. Infrarenal abdominal aortic aneurysm is stable measuring 3 cm. Prominent peripancreatic and portacaval node are unchanged measuring up to 1.7 cm, image 71/2. Stable left abdominal venous collaterals. Reproductive: Prostate is unremarkable. Other: No abdominal wall hernia or abnormality. No abdominopelvic ascites. Musculoskeletal: Spondylosis identified within the lumbar spine. No aggressive lytic or sclerotic bone lesions. IMPRESSION: 1. Continued decrease in size of right  upper lobe lung lesion. Additional small pulmonary nodules bilaterally are unchanged. 2. Persistent but stable appearance of moderately enlarged left axillary lymph node. This could be sampled under ultrasound guidance if clinically warranted. 3. Prominent upper abdominal lymph nodes are stable. 4.  Aortic Atherosclerosis (ICD10-I70.0). 5.  Emphysema (ICD10-J43.9). 6. Coronary artery atherosclerotic  calcifications 7. Stable right adrenal gland adenoma. Electronically Signed   By: Kerby Moors M.D.   On: 11/25/2017 15:43    ASSESSMENT AND PLAN: This is a very pleasant 67 years old white male with a stage IV non-small cell lung cancer, squamous cell carcinoma.  The patient is currently undergoing systemic chemotherapy with carboplatin, paclitaxel and Keytruda status post 4 cycles with partial response. He is currently undergoing maintenance treatment with single agent Keytruda status post 13 cycles. The patient continues to tolerate this treatment well. I recommended for him to proceed with cycle #14 today. He will come back for follow-up visit in 3 weeks for evaluation before starting cycle #15. For hypertension, the patient was advised to continue his current blood pressure medication and to monitor it closely at home. For the peripheral neuropathy, he will continue on gabapentin. He was advised to call immediately if he has any concerning symptoms in the interval. The patient voices understanding of current disease status and treatment options and is in agreement with the current care plan. All questions were answered. The patient knows to call the clinic with any problems, questions or concerns. We can certainly see the patient much sooner if necessary.  Disclaimer: This note was dictated with voice recognition software. Similar sounding words can inadvertently be transcribed and may not be corrected upon review.

## 2018-01-07 ENCOUNTER — Telehealth: Payer: Self-pay | Admitting: Internal Medicine

## 2018-01-07 ENCOUNTER — Inpatient Hospital Stay: Payer: Medicare Other

## 2018-01-07 ENCOUNTER — Encounter: Payer: Self-pay | Admitting: Internal Medicine

## 2018-01-07 ENCOUNTER — Inpatient Hospital Stay (HOSPITAL_BASED_OUTPATIENT_CLINIC_OR_DEPARTMENT_OTHER): Payer: Medicare Other | Admitting: Internal Medicine

## 2018-01-07 ENCOUNTER — Inpatient Hospital Stay: Payer: Medicare Other | Attending: Oncology

## 2018-01-07 VITALS — BP 134/72 | HR 65 | Temp 97.6°F | Resp 18 | Ht >= 80 in | Wt 303.5 lb

## 2018-01-07 DIAGNOSIS — Z9221 Personal history of antineoplastic chemotherapy: Secondary | ICD-10-CM | POA: Diagnosis not present

## 2018-01-07 DIAGNOSIS — I4892 Unspecified atrial flutter: Secondary | ICD-10-CM | POA: Insufficient documentation

## 2018-01-07 DIAGNOSIS — N189 Chronic kidney disease, unspecified: Secondary | ICD-10-CM | POA: Diagnosis not present

## 2018-01-07 DIAGNOSIS — C3411 Malignant neoplasm of upper lobe, right bronchus or lung: Secondary | ICD-10-CM | POA: Diagnosis present

## 2018-01-07 DIAGNOSIS — J449 Chronic obstructive pulmonary disease, unspecified: Secondary | ICD-10-CM | POA: Insufficient documentation

## 2018-01-07 DIAGNOSIS — Z5112 Encounter for antineoplastic immunotherapy: Secondary | ICD-10-CM | POA: Diagnosis not present

## 2018-01-07 DIAGNOSIS — C3491 Malignant neoplasm of unspecified part of right bronchus or lung: Secondary | ICD-10-CM

## 2018-01-07 DIAGNOSIS — E7800 Pure hypercholesterolemia, unspecified: Secondary | ICD-10-CM | POA: Insufficient documentation

## 2018-01-07 DIAGNOSIS — Z23 Encounter for immunization: Secondary | ICD-10-CM | POA: Insufficient documentation

## 2018-01-07 DIAGNOSIS — Z79899 Other long term (current) drug therapy: Secondary | ICD-10-CM

## 2018-01-07 DIAGNOSIS — I1310 Hypertensive heart and chronic kidney disease without heart failure, with stage 1 through stage 4 chronic kidney disease, or unspecified chronic kidney disease: Secondary | ICD-10-CM

## 2018-01-07 DIAGNOSIS — Z923 Personal history of irradiation: Secondary | ICD-10-CM

## 2018-01-07 DIAGNOSIS — M1990 Unspecified osteoarthritis, unspecified site: Secondary | ICD-10-CM

## 2018-01-07 DIAGNOSIS — C7951 Secondary malignant neoplasm of bone: Secondary | ICD-10-CM

## 2018-01-07 DIAGNOSIS — C3490 Malignant neoplasm of unspecified part of unspecified bronchus or lung: Secondary | ICD-10-CM

## 2018-01-07 DIAGNOSIS — Z87442 Personal history of urinary calculi: Secondary | ICD-10-CM | POA: Diagnosis not present

## 2018-01-07 LAB — CBC WITH DIFFERENTIAL (CANCER CENTER ONLY)
Abs Immature Granulocytes: 0.01 10*3/uL (ref 0.00–0.07)
BASOS ABS: 0 10*3/uL (ref 0.0–0.1)
Basophils Relative: 1 %
EOS PCT: 3 %
Eosinophils Absolute: 0.1 10*3/uL (ref 0.0–0.5)
HCT: 42.4 % (ref 39.0–52.0)
Hemoglobin: 13.9 g/dL (ref 13.0–17.0)
IMMATURE GRANULOCYTES: 0 %
Lymphocytes Relative: 31 %
Lymphs Abs: 1.3 10*3/uL (ref 0.7–4.0)
MCH: 27.7 pg (ref 26.0–34.0)
MCHC: 32.8 g/dL (ref 30.0–36.0)
MCV: 84.6 fL (ref 80.0–100.0)
Monocytes Absolute: 0.5 10*3/uL (ref 0.1–1.0)
Monocytes Relative: 12 %
NEUTROS PCT: 53 %
NRBC: 0 % (ref 0.0–0.2)
Neutro Abs: 2.3 10*3/uL (ref 1.7–7.7)
PLATELETS: 168 10*3/uL (ref 150–400)
RBC: 5.01 MIL/uL (ref 4.22–5.81)
RDW: 13.9 % (ref 11.5–15.5)
WBC: 4.3 10*3/uL (ref 4.0–10.5)

## 2018-01-07 LAB — CMP (CANCER CENTER ONLY)
ALBUMIN: 3.5 g/dL (ref 3.5–5.0)
ALT: 57 U/L — ABNORMAL HIGH (ref 0–44)
ANION GAP: 8 (ref 5–15)
AST: 47 U/L — ABNORMAL HIGH (ref 15–41)
Alkaline Phosphatase: 103 U/L (ref 38–126)
BUN: 11 mg/dL (ref 8–23)
CHLORIDE: 105 mmol/L (ref 98–111)
CO2: 26 mmol/L (ref 22–32)
Calcium: 9.3 mg/dL (ref 8.9–10.3)
Creatinine: 0.98 mg/dL (ref 0.61–1.24)
GFR, Est AFR Am: >60 mL/min (ref >60)
GFR, Estimated: >60 mL/min (ref >60)
GLUCOSE: 115 mg/dL — AB (ref 70–99)
Potassium: 3.9 mmol/L (ref 3.5–5.1)
SODIUM: 139 mmol/L (ref 135–145)
TOTAL PROTEIN: 6.8 g/dL (ref 6.5–8.1)
Total Bilirubin: 1 mg/dL (ref 0.3–1.2)

## 2018-01-07 LAB — TSH: TSH: 3.781 u[IU]/mL (ref 0.320–4.118)

## 2018-01-07 MED ORDER — SODIUM CHLORIDE 0.9 % IV SOLN
200.0000 mg | Freq: Once | INTRAVENOUS | Status: AC
  Administered 2018-01-07: 200 mg via INTRAVENOUS
  Filled 2018-01-07: qty 8

## 2018-01-07 MED ORDER — SODIUM CHLORIDE 0.9 % IV SOLN
Freq: Once | INTRAVENOUS | Status: AC
  Administered 2018-01-07: 12:00:00 via INTRAVENOUS
  Filled 2018-01-07: qty 250

## 2018-01-07 MED ORDER — INFLUENZA VAC SPLIT QUAD 0.5 ML IM SUSY
PREFILLED_SYRINGE | INTRAMUSCULAR | Status: AC
  Filled 2018-01-07: qty 0.5

## 2018-01-07 MED ORDER — INFLUENZA VAC SPLIT QUAD 0.5 ML IM SUSY
0.5000 mL | PREFILLED_SYRINGE | Freq: Once | INTRAMUSCULAR | Status: AC
  Administered 2018-01-07: 0.5 mL via INTRAMUSCULAR

## 2018-01-07 NOTE — Telephone Encounter (Signed)
appts already scheduled per 10/8 los - no additional appts added.

## 2018-01-07 NOTE — Progress Notes (Signed)
[facility] [phone]   [phone]  OFFICE PROGRESS NOTE  Sheryle Hail, PA-C [address] [place] [address]  DIAGNOSIS: Stage IV (T2a,N3,M1c)non-small cell lung cancer, squamous cell carcinoma presented with right upper lobe lung mass in addition to right hilar and mediastinal adenopathy as well as metastatic disease in the medial right thigh diagnosed in November 2018.  PRIOR THERAPY:  1) Palliative radiotherapy to the right lower extremity mass completed April 09, 2017. 2) Systemic chemotherapy with carboplatin for AUC of 5, paclitaxel 175 mg/M2 and Keytruda 200 mg IV every 3 weeks.  First dose March 12, 2017, status post 4 cycles with partial response.  CURRENT THERAPY: Maintenance treatment with single agent Keytruda 200 mg IV every 3 weeks.  First cycle June 11, 2017.  Status post 14 cycles.  INTERVAL HISTORY: Aaron Sellers 67 y.o. male returns to the clinic today for follow-up visit accompanied by his wife.  The patient is feeling fine today with no specific complaints.  He had 1 or 2 episodes of intermittent pain in the left flank area but this completely resolved.  He has a history of kidney stones in the past.  He denied having any dysuria.  The patient denied having any chest pain, shortness of breath, cough or hemoptysis.  He denied having any fever or chills.  He has no nausea, vomiting, diarrhea or constipation.  He is here today for evaluation before starting cycle #15.   MEDICAL HISTORY: Past Medical History:  Diagnosis Date  . Arthritis   . Atrial flutter ([facility])   . BPH (benign prostatic hypertrophy) with urinary retention   . Cancer ([facility])   . Chronic kidney disease    hx of kidney stones  2011  . COPD (chronic obstructive pulmonary disease) ([facility])    has been smoking since he was 22 ish--  . High cholesterol   . Hypertension    dx around 2011  . Squamous cell carcinoma of right lung (HCC)      ALLERGIES:  is allergic to ciprofloxacin; adhesive [tape]; and codeine.  MEDICATIONS:  Current Outpatient Medications  Medication Sig Dispense Refill  . acetaminophen (TYLENOL) 500 MG tablet Take 1,000 mg by mouth every 6 (six) hours as needed for moderate pain.    Marland Kitchen amiodarone (PACERONE) 200 MG tablet Starting 06/13/17, TAKE 200 MG DAILY 90 tablet 2  . atorvastatin (LIPITOR) 10 MG tablet Take 10 mg by mouth every evening.     . Cyanocobalamin (VITAMIN B-12 PO) Take 1 tablet by mouth daily.    . famotidine (PEPCID) 10 MG tablet Take 10 mg by mouth as needed for heartburn or indigestion.    . furosemide (LASIX) 40 MG tablet Take 1 tablet (40 mg total) by mouth daily as needed. 90 tablet 3  . gabapentin (NEURONTIN) 300 MG capsule TAKE 1 CAPSULE BY MOUTH THREE TIMES DAILY 90 capsule 0  . loratadine (CLARITIN) 10 MG tablet Take 10 mg by mouth daily.    . tamsulosin (FLOMAX) 0.4 MG CAPS capsule Take 0.4 mg by mouth daily.     No current facility-administered medications for this visit.    Facility-Administered Medications Ordered in Other Visits  Medication Dose Route Frequency Provider Last Rate Last Dose  . heparin lock flush 100 unit/mL  500 Units Intracatheter Once PRN Curt Bears, MD      . sodium chloride flush (NS) 0.9 % injection 10 mL  10 mL Intracatheter PRN Curt Bears, MD  SURGICAL HISTORY:  Past Surgical History:  Procedure Laterality Date  . HERNIA REPAIR     umbilical hernia  05/2023  . INCISION AND DRAINAGE ABSCESS     "down the middle" of his buttocks  2015  . TOTAL KNEE ARTHROPLASTY Left 10/29/2014   Procedure: TOTAL KNEE ARTHROPLASTY;  Surgeon: Dorna Leitz, MD;  Location: [facility];  Service: Orthopedics;  Laterality: Left;    REVIEW OF SYSTEMS:  A comprehensive review of systems was negative.   PHYSICAL EXAMINATION: General appearance: alert, cooperative and no distress Head: Normocephalic, without obvious abnormality, atraumatic Neck: no  adenopathy, no JVD, supple, symmetrical, trachea midline and thyroid not enlarged, symmetric, no tenderness/mass/nodules Lymph nodes: Cervical, supraclavicular, and axillary nodes normal. Resp: clear to auscultation bilaterally Back: symmetric, no curvature. ROM normal. No CVA tenderness. Cardio: regular rate and rhythm, S1, S2 normal, no murmur, click, rub or gallop GI: soft, non-tender; bowel sounds normal; no masses,  no organomegaly Extremities: extremities normal, atraumatic, no cyanosis or edema  ECOG PERFORMANCE STATUS: 1 - Symptomatic but completely ambulatory  Blood pressure 134/72, pulse 65, temperature 97.6 F (36.4 C), temperature source Oral, resp. rate 18, height 6\' 9"  (2.057 m), weight (!) 303 lb 8 oz (137.7 kg), SpO2 97 %.  LABORATORY DATA: Lab Results  Component Value Date   WBC 4.3 01/07/2018   HGB 13.9 01/07/2018   HCT 42.4 01/07/2018   MCV 84.6 01/07/2018   PLT 168 01/07/2018      Chemistry      Component Value Date/Time   NA 139 01/07/2018 1002   NA 137 04/03/2017 1030   K 3.9 01/07/2018 1002   K 4.1 04/03/2017 1030   CL 105 01/07/2018 1002   CO2 26 01/07/2018 1002   CO2 27 04/03/2017 1030   BUN 11 01/07/2018 1002   BUN 15.5 04/03/2017 1030   CREATININE 0.98 01/07/2018 1002   CREATININE 0.8 04/03/2017 1030      Component Value Date/Time   CALCIUM 9.3 01/07/2018 1002   CALCIUM 9.4 04/03/2017 1030   ALKPHOS 103 01/07/2018 1002   ALKPHOS 97 04/03/2017 1030   AST 47 (H) 01/07/2018 1002   AST 14 04/03/2017 1030   ALT 57 (H) 01/07/2018 1002   ALT 21 04/03/2017 1030   BILITOT 1.0 01/07/2018 1002   BILITOT 0.67 04/03/2017 1030       RADIOGRAPHIC STUDIES: No results found.  ASSESSMENT AND PLAN: This is a very pleasant 67 years old white male with a stage IV non-small cell lung cancer, squamous cell carcinoma.  The patient is currently undergoing systemic chemotherapy with carboplatin, paclitaxel and Keytruda status post 4 cycles with partial  response. He is currently undergoing maintenance treatment with single agent Keytruda status post 14 cycles. The patient continues to tolerate his treatment well with no concerning complaints. I recommended for the patient to proceed with cycle #15 today as scheduled. I will see the patient back for follow-up visit in 3 weeks for evaluation after repeating CT scan of the chest, abdomen and pelvis for restaging of his disease. The patient was advised to call immediately if he has any concerning symptoms in the interval. The patient voices understanding of current disease status and treatment options and is in agreement with the current care plan. All questions were answered. The patient knows to call the clinic with any problems, questions or concerns. We can certainly see the patient much sooner if necessary.  Disclaimer: This note was dictated with voice recognition software. Similar sounding words can  inadvertently be transcribed and may not be corrected upon review.       

## 2018-01-07 NOTE — Patient Instructions (Signed)
[facility] Discharge Instructions for Patients Receiving Chemotherapy  Today you received the following chemotherapy agents:  Keytruda.  To help prevent nausea and vomiting after your treatment, we encourage you to take your nausea medication as directed.   If you develop nausea and vomiting that is not controlled by your nausea medication, call the clinic.   BELOW ARE SYMPTOMS THAT SHOULD BE REPORTED IMMEDIATELY:  *FEVER GREATER THAN 100.5 F  *CHILLS WITH OR WITHOUT FEVER  NAUSEA AND VOMITING THAT IS NOT CONTROLLED WITH YOUR NAUSEA MEDICATION  *UNUSUAL SHORTNESS OF BREATH  *UNUSUAL BRUISING OR BLEEDING  TENDERNESS IN MOUTH AND THROAT WITH OR WITHOUT PRESENCE OF ULCERS  *URINARY PROBLEMS  *BOWEL PROBLEMS  UNUSUAL RASH Items with * indicate a potential emergency and should be followed up as soon as possible.  Feel free to call the clinic should you have any questions or concerns. The clinic phone number is [phone].  Please show the CHEMO ALERT CARD at check-in to the Emergency Department and triage nurse.    

## 2018-01-24 ENCOUNTER — Ambulatory Visit (HOSPITAL_COMMUNITY)
Admission: RE | Admit: 2018-01-24 | Discharge: 2018-01-24 | Disposition: A | Discharge status: Home / Self Care | Payer: Medicare Other | Source: Ambulatory Visit | Attending: Internal Medicine | Admitting: Internal Medicine

## 2018-01-24 ENCOUNTER — Inpatient Hospital Stay: Payer: Medicare Other

## 2018-01-24 ENCOUNTER — Encounter (HOSPITAL_COMMUNITY): Payer: Self-pay

## 2018-01-24 ENCOUNTER — Telehealth: Payer: Self-pay

## 2018-01-24 DIAGNOSIS — R911 Solitary pulmonary nodule: Secondary | ICD-10-CM | POA: Insufficient documentation

## 2018-01-24 DIAGNOSIS — I2510 Atherosclerotic heart disease of native coronary artery without angina pectoris: Secondary | ICD-10-CM | POA: Insufficient documentation

## 2018-01-24 DIAGNOSIS — R590 Localized enlarged lymph nodes: Secondary | ICD-10-CM | POA: Insufficient documentation

## 2018-01-24 DIAGNOSIS — C3490 Malignant neoplasm of unspecified part of unspecified bronchus or lung: Secondary | ICD-10-CM | POA: Insufficient documentation

## 2018-01-24 DIAGNOSIS — C3491 Malignant neoplasm of unspecified part of right bronchus or lung: Secondary | ICD-10-CM

## 2018-01-24 DIAGNOSIS — I700 Atherosclerosis of aorta: Secondary | ICD-10-CM | POA: Diagnosis not present

## 2018-01-24 DIAGNOSIS — C3411 Malignant neoplasm of upper lobe, right bronchus or lung: Secondary | ICD-10-CM | POA: Diagnosis not present

## 2018-01-24 DIAGNOSIS — N200 Calculus of kidney: Secondary | ICD-10-CM | POA: Insufficient documentation

## 2018-01-24 LAB — CBC WITH DIFFERENTIAL (CANCER CENTER ONLY)
ABS IMMATURE GRANULOCYTES: 0.01 10*3/uL (ref 0.00–0.07)
BASOS ABS: 0 10*3/uL (ref 0.0–0.1)
Basophils Relative: 1 %
Eosinophils Absolute: 0.2 10*3/uL (ref 0.0–0.5)
Eosinophils Relative: 4 %
HCT: 42.9 % (ref 39.0–52.0)
HEMOGLOBIN: 13.8 g/dL (ref 13.0–17.0)
IMMATURE GRANULOCYTES: 0 %
LYMPHS PCT: 34 %
Lymphs Abs: 1.5 10*3/uL (ref 0.7–4.0)
MCH: 27.5 pg (ref 26.0–34.0)
MCHC: 32.2 g/dL (ref 30.0–36.0)
MCV: 85.5 fL (ref 80.0–100.0)
MONO ABS: 0.6 10*3/uL (ref 0.1–1.0)
Monocytes Relative: 13 %
NEUTROS ABS: 2.2 10*3/uL (ref 1.7–7.7)
Neutrophils Relative %: 48 %
Platelet Count: 159 10*3/uL (ref 150–400)
RBC: 5.02 MIL/uL (ref 4.22–5.81)
RDW: 13.7 % (ref 11.5–15.5)
WBC Count: 4.5 10*3/uL (ref 4.0–10.5)
nRBC: 0 % (ref 0.0–0.2)

## 2018-01-24 LAB — CMP (CANCER CENTER ONLY)
ALBUMIN: 3.5 g/dL (ref 3.5–5.0)
ALT: 51 U/L — ABNORMAL HIGH (ref 0–44)
AST: 48 U/L — AB (ref 15–41)
Alkaline Phosphatase: 87 U/L (ref 38–126)
Anion gap: 9 (ref 5–15)
BUN: 14 mg/dL (ref 8–23)
CHLORIDE: 101 mmol/L (ref 98–111)
CO2: 27 mmol/L (ref 22–32)
Calcium: 9.1 mg/dL (ref 8.9–10.3)
Creatinine: 0.85 mg/dL (ref 0.61–1.24)
GFR, Est AFR Am: >60 mL/min (ref >60)
Glucose, Bld: 93 mg/dL (ref 70–99)
POTASSIUM: 4 mmol/L (ref 3.5–5.1)
Sodium: 137 mmol/L (ref 135–145)
Total Bilirubin: 1.1 mg/dL (ref 0.3–1.2)
Total Protein: 6.9 g/dL (ref 6.5–8.1)

## 2018-01-24 MED ORDER — IOHEXOL 300 MG/ML  SOLN
100.0000 mL | Freq: Once | INTRAMUSCULAR | Status: AC | PRN
  Administered 2018-01-24: 100 mL via INTRAVENOUS

## 2018-01-24 MED ORDER — SODIUM CHLORIDE 0.9 % IJ SOLN
INTRAMUSCULAR | Status: AC
  Filled 2018-01-24: qty 50

## 2018-01-24 NOTE — Telephone Encounter (Signed)
Aaron Sellers gave verbal consent to add patient on for labs today and cancel labs on Tuesday. Per 10/25 walk in

## 2018-01-28 ENCOUNTER — Inpatient Hospital Stay: Payer: Medicare Other

## 2018-01-28 ENCOUNTER — Inpatient Hospital Stay (HOSPITAL_BASED_OUTPATIENT_CLINIC_OR_DEPARTMENT_OTHER): Payer: Medicare Other | Admitting: Internal Medicine

## 2018-01-28 ENCOUNTER — Encounter: Payer: Self-pay | Admitting: Internal Medicine

## 2018-01-28 VITALS — BP 141/81 | HR 61 | Temp 97.6°F | Resp 20 | Ht >= 80 in | Wt 304.9 lb

## 2018-01-28 DIAGNOSIS — N189 Chronic kidney disease, unspecified: Secondary | ICD-10-CM

## 2018-01-28 DIAGNOSIS — C7951 Secondary malignant neoplasm of bone: Secondary | ICD-10-CM

## 2018-01-28 DIAGNOSIS — C3411 Malignant neoplasm of upper lobe, right bronchus or lung: Secondary | ICD-10-CM

## 2018-01-28 DIAGNOSIS — I4892 Unspecified atrial flutter: Secondary | ICD-10-CM

## 2018-01-28 DIAGNOSIS — Z79899 Other long term (current) drug therapy: Secondary | ICD-10-CM

## 2018-01-28 DIAGNOSIS — C3491 Malignant neoplasm of unspecified part of right bronchus or lung: Secondary | ICD-10-CM

## 2018-01-28 DIAGNOSIS — J449 Chronic obstructive pulmonary disease, unspecified: Secondary | ICD-10-CM

## 2018-01-28 DIAGNOSIS — Z923 Personal history of irradiation: Secondary | ICD-10-CM

## 2018-01-28 DIAGNOSIS — I10 Essential (primary) hypertension: Secondary | ICD-10-CM

## 2018-01-28 DIAGNOSIS — Z5112 Encounter for antineoplastic immunotherapy: Secondary | ICD-10-CM

## 2018-01-28 DIAGNOSIS — I1310 Hypertensive heart and chronic kidney disease without heart failure, with stage 1 through stage 4 chronic kidney disease, or unspecified chronic kidney disease: Secondary | ICD-10-CM

## 2018-01-28 DIAGNOSIS — Z9221 Personal history of antineoplastic chemotherapy: Secondary | ICD-10-CM

## 2018-01-28 DIAGNOSIS — E7800 Pure hypercholesterolemia, unspecified: Secondary | ICD-10-CM

## 2018-01-28 DIAGNOSIS — M1990 Unspecified osteoarthritis, unspecified site: Secondary | ICD-10-CM

## 2018-01-28 DIAGNOSIS — Z87442 Personal history of urinary calculi: Secondary | ICD-10-CM

## 2018-01-28 MED ORDER — SODIUM CHLORIDE 0.9 % IV SOLN
200.0000 mg | Freq: Once | INTRAVENOUS | Status: AC
  Administered 2018-01-28: 200 mg via INTRAVENOUS
  Filled 2018-01-28: qty 8

## 2018-01-28 MED ORDER — SODIUM CHLORIDE 0.9 % IV SOLN
Freq: Once | INTRAVENOUS | Status: AC
  Administered 2018-01-28: 10:00:00 via INTRAVENOUS
  Filled 2018-01-28: qty 250

## 2018-01-28 NOTE — Progress Notes (Signed)
[place] [phone]   [phone]  OFFICE PROGRESS NOTE  Sheryle Hail, PA-C [address] [place] [address]  DIAGNOSIS: Stage IV (T2a,N3,M1c)non-small cell lung cancer, squamous cell carcinoma presented with right upper lobe lung mass in addition to right hilar and mediastinal adenopathy as well as metastatic disease in the medial right thigh diagnosed in November 2018.  PRIOR THERAPY:  1) Palliative radiotherapy to the right lower extremity mass completed April 09, 2017. 2) Systemic chemotherapy with carboplatin for AUC of 5, paclitaxel 175 mg/M2 and Keytruda 200 mg IV every 3 weeks.  First dose March 12, 2017, status post 4 cycles with partial response.  CURRENT THERAPY: Maintenance treatment with single agent Keytruda 200 mg IV every 3 weeks.  First cycle June 11, 2017.  Status post 15 cycles.  INTERVAL HISTORY: Aaron Sellers 67 y.o. male returns to the clinic today for follow-up visit accompanied by his wife.  The patient is feeling fine today with no concerning complaints except for mild cough productive of milky sputum.  He denied having any fever or chills.  He has no chest pain, shortness breath or hemoptysis.  He denied having any significant weight loss or night sweats.  He has no nausea, vomiting, diarrhea or constipation.  He continues to tolerate his treatment with Keytruda fairly well.  The patient had repeat CT scan of the chest performed recently and he is here for evaluation and discussion of his discuss results.  MEDICAL HISTORY: Past Medical History:  Diagnosis Date  . Arthritis   . Atrial flutter ([facility])   . BPH (benign prostatic hypertrophy) with urinary retention   . Cancer ([facility])   . Chronic kidney disease    hx of kidney stones  2011  . COPD (chronic obstructive pulmonary disease) ([facility] [place] [facility])    has been smoking since he was 22 ish--  . High cholesterol   . Hypertension    dx around 2011  . Squamous cell  carcinoma of right lung ([facility])     ALLERGIES:  is allergic to ciprofloxacin; adhesive [tape]; and codeine.  MEDICATIONS:  Current Outpatient Medications  Medication Sig Dispense Refill  . acetaminophen (TYLENOL) 500 MG tablet Take 1,000 mg by mouth every 6 (six) hours as needed for moderate pain.    Marland Kitchen amiodarone (PACERONE) 200 MG tablet Starting 06/13/17, TAKE 200 MG DAILY 90 tablet 2  . atorvastatin (LIPITOR) 10 MG tablet Take 10 mg by mouth every evening.     . Cyanocobalamin (VITAMIN B-12 PO) Take 1 tablet by mouth daily.    . famotidine (PEPCID) 10 MG tablet Take 10 mg by mouth as needed for heartburn or indigestion.    . gabapentin (NEURONTIN) 300 MG capsule TAKE 1 CAPSULE BY MOUTH THREE TIMES DAILY 90 capsule 0  . loratadine (CLARITIN) 10 MG tablet Take 10 mg by mouth daily.    . tamsulosin (FLOMAX) 0.4 MG CAPS capsule Take 0.4 mg by mouth daily.    . furosemide (LASIX) 40 MG tablet Take 1 tablet (40 mg total) by mouth daily as needed. 90 tablet 3   No current facility-administered medications for this visit.    Facility-Administered Medications Ordered in Other Visits  Medication Dose Route Frequency Provider Last Rate Last Dose  . heparin lock flush 100 unit/mL  500 Units Intracatheter Once PRN Curt Bears, MD      . sodium chloride flush (NS) 0.9 % injection 10 mL  10 mL Intracatheter PRN Curt Bears, MD  SURGICAL HISTORY:  Past Surgical History:  Procedure Laterality Date  . HERNIA REPAIR     umbilical hernia  04/8297  . INCISION AND DRAINAGE ABSCESS     "down the middle" of his buttocks  2015  . TOTAL KNEE ARTHROPLASTY Left 10/29/2014   Procedure: TOTAL KNEE ARTHROPLASTY;  Surgeon: Dorna Leitz, MD;  Location: [facility];  Service: Orthopedics;  Laterality: Left;    REVIEW OF SYSTEMS:  Constitutional: negative Eyes: negative Ears, nose, mouth, throat, and face: negative Respiratory: positive for cough Cardiovascular: negative Gastrointestinal:  negative Genitourinary:negative Integument/breast: negative Hematologic/lymphatic: negative Musculoskeletal:negative Neurological: negative Behavioral/Psych: negative Endocrine: negative Allergic/Immunologic: negative   PHYSICAL EXAMINATION: General appearance: alert, cooperative and no distress Head: Normocephalic, without obvious abnormality, atraumatic Neck: no adenopathy, no JVD, supple, symmetrical, trachea midline and thyroid not enlarged, symmetric, no tenderness/mass/nodules Lymph nodes: Cervical, supraclavicular, and axillary nodes normal. Resp: clear to auscultation bilaterally Back: symmetric, no curvature. ROM normal. No CVA tenderness. Cardio: regular rate and rhythm, S1, S2 normal, no murmur, click, rub or gallop GI: soft, non-tender; bowel sounds normal; no masses,  no organomegaly Extremities: extremities normal, atraumatic, no cyanosis or edema Neurologic: Alert and oriented X 3, normal strength and tone. Normal symmetric reflexes. Normal coordination and gait  ECOG PERFORMANCE STATUS: 1 - Symptomatic but completely ambulatory  Blood pressure (!) 141/81, pulse 61, temperature 97.6 F (36.4 C), temperature source Oral, resp. rate 20, height 6\' 9"  (2.057 m), weight (!) 304 lb 14.4 oz (138.3 kg), SpO2 97 %.  LABORATORY DATA: Lab Results  Component Value Date   WBC 4.5 01/24/2018   HGB 13.8 01/24/2018   HCT 42.9 01/24/2018   MCV 85.5 01/24/2018   PLT 159 01/24/2018      Chemistry      Component Value Date/Time   NA 137 01/24/2018 1434   NA 137 04/03/2017 1030   K 4.0 01/24/2018 1434   K 4.1 04/03/2017 1030   CL 101 01/24/2018 1434   CO2 27 01/24/2018 1434   CO2 27 04/03/2017 1030   BUN 14 01/24/2018 1434   BUN 15.5 04/03/2017 1030   CREATININE 0.85 01/24/2018 1434   CREATININE 0.8 04/03/2017 1030      Component Value Date/Time   CALCIUM 9.1 01/24/2018 1434   CALCIUM 9.4 04/03/2017 1030   ALKPHOS 87 01/24/2018 1434   ALKPHOS 97 04/03/2017 1030   AST  48 (H) 01/24/2018 1434   AST 14 04/03/2017 1030   ALT 51 (H) 01/24/2018 1434   ALT 21 04/03/2017 1030   BILITOT 1.1 01/24/2018 1434   BILITOT 0.67 04/03/2017 1030       RADIOGRAPHIC STUDIES: Ct Chest W Contrast  Result Date: 01/27/2018 CLINICAL DATA:  Non-small-cell lung cancer diagnosed in 2018. Chemotherapy and radiation therapy complete. Immunotherapy in progress. Asymptomatic. EXAM: CT CHEST, ABDOMEN, AND PELVIS WITH CONTRAST TECHNIQUE: Multidetector CT imaging of the chest, abdomen and pelvis was performed following the standard protocol during bolus administration of intravenous contrast. CONTRAST:  1104mL OMNIPAQUE IOHEXOL 300 MG/ML  SOLN COMPARISON:  Multiple priors, most recent 11/25/2017. FINDINGS: CT CHEST FINDINGS Cardiovascular: Aortic and branch vessel atherosclerosis. Borderline cardiomegaly, without pericardial effusion. Lad coronary artery calcification. No central pulmonary embolism, on this non-dedicated study. Mediastinum/Nodes: No supraclavicular adenopathy. Left axillary node measures 1.7 x 2.2 cm today and is unchanged. A node within the subcarinal station with extension into the azygoesophageal recess is unchanged at 11 mm on image 33/3. No hilar adenopathy. Lungs/Pleura: No pleural fluid. Right upper lobe lung nodule measures 1.5  x 1.1 cm on image 54/8 and is felt to be similar 1.5 x 1.0 cm on the prior. Smaller pulmonary nodules are primarily similar. Examples at 3 mm in the right upper lobe on image 49/8, 4 mm in the right upper lobe on image 55/8 and 3 mm in the left upper lobe on image 69/8. However, there are new pulmonary nodules identified. Example in the medial left apex at 7 mm on image 37/8 and a vague 8 mm posterior right upper lobe pulmonary nodule on image 51/8. Musculoskeletal: No acute osseous abnormality. CT ABDOMEN PELVIS FINDINGS Hepatobiliary: Medial segment left liver lobe subtle atrophy. No focal liver lesion. Normal gallbladder, without biliary ductal  dilatation. Pancreas: Normal, without mass or ductal dilatation. Spleen: Normal in size, without focal abnormality. Adrenals/Urinary Tract: Normal left adrenal gland. Right adrenal thickening and nodularity, including at maximally 3.1 x 1.6 cm, similar. Upper/interpolar right renal cyst. Punctate right renal collecting system calculus. Normal left kidney, without hydronephrosis. Normal urinary bladder. Stomach/Bowel: Normal stomach, without wall thickening. Scattered colonic diverticula. Normal terminal ileum and appendix. Normal small bowel. Vascular/Lymphatic: Aortic and branch vessel atherosclerosis. Nonaneurysmal infrarenal aortic dilatation at 2.8 cm is similar. Portacaval 1.6 cm node is similar 1.7 cm on the prior. 1.8 cm gastrohepatic ligament node on image 67/3 is similar 1.7 cm on the prior. 1.3 cm right external iliac node on image 127/3 is similar 1.4 cm on the prior. Reproductive: Normal prostate. Other: No significant free fluid. No evidence of omental or peritoneal disease. Musculoskeletal: Lumbosacral spondylosis. IMPRESSION: 1. Similar size of a right upper lobe pulmonary nodule. 2. The majority of smaller pulmonary nodules are similar. However, there are multiple new pulmonary nodules, suspicious for progressive metastasis. Given somewhat ill-defined appearance of the vague new left-sided nodule, superimposed infection should be clinically excluded. 3. Similar left axillary and borderline mediastinal adenopathy, nonspecific. 4. No evidence of progressive metastasis within the abdomen or pelvis. Mild upper abdominal adenopathy and subtle medial segment left liver lobe atrophy. Question reactive adenopathy in the setting of mild cirrhosis. Correlate with risk factors. Mild right external iliac adenopathy is similar and nonspecific. 5. Right adrenal enlargement, likely due to adenoma or adenomas when correlated with prior PET. 6. Coronary artery atherosclerosis. Aortic Atherosclerosis (ICD10-I70.0). 7.  Right nephrolithiasis. Electronically Signed   By: Abigail Miyamoto M.D.   On: 01/27/2018 08:40   Ct Abdomen Pelvis W Contrast  Result Date: 01/27/2018 CLINICAL DATA:  Non-small-cell lung cancer diagnosed in 2018. Chemotherapy and radiation therapy complete. Immunotherapy in progress. Asymptomatic. EXAM: CT CHEST, ABDOMEN, AND PELVIS WITH CONTRAST TECHNIQUE: Multidetector CT imaging of the chest, abdomen and pelvis was performed following the standard protocol during bolus administration of intravenous contrast. CONTRAST:  159mL OMNIPAQUE IOHEXOL 300 MG/ML  SOLN COMPARISON:  Multiple priors, most recent 11/25/2017. FINDINGS: CT CHEST FINDINGS Cardiovascular: Aortic and branch vessel atherosclerosis. Borderline cardiomegaly, without pericardial effusion. Lad coronary artery calcification. No central pulmonary embolism, on this non-dedicated study. Mediastinum/Nodes: No supraclavicular adenopathy. Left axillary node measures 1.7 x 2.2 cm today and is unchanged. A node within the subcarinal station with extension into the azygoesophageal recess is unchanged at 11 mm on image 33/3. No hilar adenopathy. Lungs/Pleura: No pleural fluid. Right upper lobe lung nodule measures 1.5 x 1.1 cm on image 54/8 and is felt to be similar 1.5 x 1.0 cm on the prior. Smaller pulmonary nodules are primarily similar. Examples at 3 mm in the right upper lobe on image 49/8, 4 mm in the right upper lobe on  image 55/8 and 3 mm in the left upper lobe on image 69/8. However, there are new pulmonary nodules identified. Example in the medial left apex at 7 mm on image 37/8 and a vague 8 mm posterior right upper lobe pulmonary nodule on image 51/8. Musculoskeletal: No acute osseous abnormality. CT ABDOMEN PELVIS FINDINGS Hepatobiliary: Medial segment left liver lobe subtle atrophy. No focal liver lesion. Normal gallbladder, without biliary ductal dilatation. Pancreas: Normal, without mass or ductal dilatation. Spleen: Normal in size, without focal  abnormality. Adrenals/Urinary Tract: Normal left adrenal gland. Right adrenal thickening and nodularity, including at maximally 3.1 x 1.6 cm, similar. Upper/interpolar right renal cyst. Punctate right renal collecting system calculus. Normal left kidney, without hydronephrosis. Normal urinary bladder. Stomach/Bowel: Normal stomach, without wall thickening. Scattered colonic diverticula. Normal terminal ileum and appendix. Normal small bowel. Vascular/Lymphatic: Aortic and branch vessel atherosclerosis. Nonaneurysmal infrarenal aortic dilatation at 2.8 cm is similar. Portacaval 1.6 cm node is similar 1.7 cm on the prior. 1.8 cm gastrohepatic ligament node on image 67/3 is similar 1.7 cm on the prior. 1.3 cm right external iliac node on image 127/3 is similar 1.4 cm on the prior. Reproductive: Normal prostate. Other: No significant free fluid. No evidence of omental or peritoneal disease. Musculoskeletal: Lumbosacral spondylosis. IMPRESSION: 1. Similar size of a right upper lobe pulmonary nodule. 2. The majority of smaller pulmonary nodules are similar. However, there are multiple new pulmonary nodules, suspicious for progressive metastasis. Given somewhat ill-defined appearance of the vague new left-sided nodule, superimposed infection should be clinically excluded. 3. Similar left axillary and borderline mediastinal adenopathy, nonspecific. 4. No evidence of progressive metastasis within the abdomen or pelvis. Mild upper abdominal adenopathy and subtle medial segment left liver lobe atrophy. Question reactive adenopathy in the setting of mild cirrhosis. Correlate with risk factors. Mild right external iliac adenopathy is similar and nonspecific. 5. Right adrenal enlargement, likely due to adenoma or adenomas when correlated with prior PET. 6. Coronary artery atherosclerosis. Aortic Atherosclerosis (ICD10-I70.0). 7. Right nephrolithiasis. Electronically Signed   By: Abigail Miyamoto M.D.   On: 01/27/2018 08:40     ASSESSMENT AND PLAN: This is a very pleasant 67 years old white male with a stage IV non-small cell lung cancer, squamous cell carcinoma.  The patient is currently undergoing systemic chemotherapy with carboplatin, paclitaxel and Keytruda status post 4 cycles with partial response. He is currently undergoing maintenance treatment with single agent Keytruda status post 15 cycles. The patient has been tolerating this treatment well with no concerning adverse effects. Repeat CT scan of the chest, abdomen and pelvis showed stable disease except for questionable small pulmonary nodule suspicious for disease progression versus inflammatory process. I discussed the scan results and showed the images to the patient and his wife. I recommended for the patient to continue his current treatment with single agent Ketruda (pembrolizumab) for now.  I will continue to monitor the new pulmonary nodules on the upcoming scan before making any change to his treatment. For hypertension, the patient will continue with his current blood pressure medications.  He was also advised to monitor it closely at home. He was advised to call immediately if he has any concerning symptoms in the interval. The patient voices understanding of current disease status and treatment options and is in agreement with the current care plan. All questions were answered. The patient knows to call the clinic with any problems, questions or concerns. We can certainly see the patient much sooner if necessary.  Disclaimer: This note was  dictated with voice recognition software. Similar sounding words can inadvertently be transcribed and may not be corrected upon review.       

## 2018-01-28 NOTE — Patient Instructions (Signed)
[facility] Discharge Instructions for Patients Receiving Chemotherapy  Today you received the following chemotherapy agents:  Keytruda.  To help prevent nausea and vomiting after your treatment, we encourage you to take your nausea medication as directed.   If you develop nausea and vomiting that is not controlled by your nausea medication, call the clinic.   BELOW ARE SYMPTOMS THAT SHOULD BE REPORTED IMMEDIATELY:  *FEVER GREATER THAN 100.5 F  *CHILLS WITH OR WITHOUT FEVER  NAUSEA AND VOMITING THAT IS NOT CONTROLLED WITH YOUR NAUSEA MEDICATION  *UNUSUAL SHORTNESS OF BREATH  *UNUSUAL BRUISING OR BLEEDING  TENDERNESS IN MOUTH AND THROAT WITH OR WITHOUT PRESENCE OF ULCERS  *URINARY PROBLEMS  *BOWEL PROBLEMS  UNUSUAL RASH Items with * indicate a potential emergency and should be followed up as soon as possible.  Feel free to call the clinic should you have any questions or concerns. The clinic phone number is [phone].  Please show the CHEMO ALERT CARD at check-in to the Emergency Department and triage nurse.    

## 2018-01-30 ENCOUNTER — Telehealth: Payer: Self-pay | Admitting: Medical Oncology

## 2018-01-30 NOTE — Telephone Encounter (Signed)
last office note faxed to [facility].

## 2018-02-18 ENCOUNTER — Inpatient Hospital Stay: Payer: Medicare Other | Attending: Oncology

## 2018-02-18 ENCOUNTER — Inpatient Hospital Stay: Payer: Medicare Other

## 2018-02-18 ENCOUNTER — Encounter: Payer: Self-pay | Admitting: Internal Medicine

## 2018-02-18 ENCOUNTER — Inpatient Hospital Stay (HOSPITAL_BASED_OUTPATIENT_CLINIC_OR_DEPARTMENT_OTHER): Payer: Medicare Other | Admitting: Internal Medicine

## 2018-02-18 VITALS — BP 121/66 | HR 64 | Temp 97.4°F | Resp 18 | Ht >= 80 in | Wt 296.4 lb

## 2018-02-18 DIAGNOSIS — Z79899 Other long term (current) drug therapy: Secondary | ICD-10-CM | POA: Insufficient documentation

## 2018-02-18 DIAGNOSIS — I2510 Atherosclerotic heart disease of native coronary artery without angina pectoris: Secondary | ICD-10-CM | POA: Insufficient documentation

## 2018-02-18 DIAGNOSIS — Z5112 Encounter for antineoplastic immunotherapy: Secondary | ICD-10-CM

## 2018-02-18 DIAGNOSIS — N189 Chronic kidney disease, unspecified: Secondary | ICD-10-CM | POA: Diagnosis not present

## 2018-02-18 DIAGNOSIS — C3411 Malignant neoplasm of upper lobe, right bronchus or lung: Secondary | ICD-10-CM | POA: Diagnosis not present

## 2018-02-18 DIAGNOSIS — Z9221 Personal history of antineoplastic chemotherapy: Secondary | ICD-10-CM

## 2018-02-18 DIAGNOSIS — I4892 Unspecified atrial flutter: Secondary | ICD-10-CM | POA: Insufficient documentation

## 2018-02-18 DIAGNOSIS — E7800 Pure hypercholesterolemia, unspecified: Secondary | ICD-10-CM | POA: Insufficient documentation

## 2018-02-18 DIAGNOSIS — Z923 Personal history of irradiation: Secondary | ICD-10-CM | POA: Diagnosis not present

## 2018-02-18 DIAGNOSIS — C3491 Malignant neoplasm of unspecified part of right bronchus or lung: Secondary | ICD-10-CM

## 2018-02-18 DIAGNOSIS — J449 Chronic obstructive pulmonary disease, unspecified: Secondary | ICD-10-CM

## 2018-02-18 DIAGNOSIS — C7951 Secondary malignant neoplasm of bone: Secondary | ICD-10-CM | POA: Diagnosis not present

## 2018-02-18 DIAGNOSIS — I129 Hypertensive chronic kidney disease with stage 1 through stage 4 chronic kidney disease, or unspecified chronic kidney disease: Secondary | ICD-10-CM

## 2018-02-18 LAB — CBC WITH DIFFERENTIAL (CANCER CENTER ONLY)
ABS IMMATURE GRANULOCYTES: 0.01 10*3/uL (ref 0.00–0.07)
Basophils Absolute: 0 10*3/uL (ref 0.0–0.1)
Basophils Relative: 1 %
Eosinophils Absolute: 0.2 10*3/uL (ref 0.0–0.5)
Eosinophils Relative: 4 %
HEMATOCRIT: 43.4 % (ref 39.0–52.0)
HEMOGLOBIN: 14.2 g/dL (ref 13.0–17.0)
Immature Granulocytes: 0 %
LYMPHS ABS: 1.3 10*3/uL (ref 0.7–4.0)
LYMPHS PCT: 30 %
MCH: 27.6 pg (ref 26.0–34.0)
MCHC: 32.7 g/dL (ref 30.0–36.0)
MCV: 84.4 fL (ref 80.0–100.0)
MONO ABS: 0.5 10*3/uL (ref 0.1–1.0)
MONOS PCT: 12 %
Neutro Abs: 2.3 10*3/uL (ref 1.7–7.7)
Neutrophils Relative %: 53 %
Platelet Count: 162 10*3/uL (ref 150–400)
RBC: 5.14 MIL/uL (ref 4.22–5.81)
RDW: 13.7 % (ref 11.5–15.5)
WBC: 4.3 10*3/uL (ref 4.0–10.5)
nRBC: 0 % (ref 0.0–0.2)

## 2018-02-18 LAB — CMP (CANCER CENTER ONLY)
ALBUMIN: 3.5 g/dL (ref 3.5–5.0)
ALT: 43 U/L (ref 0–44)
AST: 40 U/L (ref 15–41)
Alkaline Phosphatase: 98 U/L (ref 38–126)
Anion gap: 9 (ref 5–15)
BUN: 16 mg/dL (ref 8–23)
CHLORIDE: 103 mmol/L (ref 98–111)
CO2: 27 mmol/L (ref 22–32)
CREATININE: 1.16 mg/dL (ref 0.61–1.24)
Calcium: 9.1 mg/dL (ref 8.9–10.3)
GFR, Est AFR Am: >60 mL/min (ref >60)
GLUCOSE: 163 mg/dL — AB (ref 70–99)
Potassium: 3.7 mmol/L (ref 3.5–5.1)
Sodium: 139 mmol/L (ref 135–145)
Total Bilirubin: 1 mg/dL (ref 0.3–1.2)
Total Protein: 7 g/dL (ref 6.5–8.1)

## 2018-02-18 MED ORDER — SODIUM CHLORIDE 0.9 % IV SOLN
200.0000 mg | Freq: Once | INTRAVENOUS | Status: AC
  Administered 2018-02-18: 200 mg via INTRAVENOUS
  Filled 2018-02-18: qty 8

## 2018-02-18 MED ORDER — SODIUM CHLORIDE 0.9 % IV SOLN
Freq: Once | INTRAVENOUS | Status: AC
  Administered 2018-02-18: 13:00:00 via INTRAVENOUS
  Filled 2018-02-18: qty 250

## 2018-02-18 NOTE — Progress Notes (Signed)
[facility] [phone]   [phone]  OFFICE PROGRESS NOTE  Sheryle Hail, PA-C [address] [place] [address]  DIAGNOSIS: Stage IV (T2a,N3,M1c)non-small cell lung cancer, squamous cell carcinoma presented with right upper lobe lung mass in addition to right hilar and mediastinal adenopathy as well as metastatic disease in the medial right thigh diagnosed in November 2018.  PRIOR THERAPY:  1) Palliative radiotherapy to the right lower extremity mass completed April 09, 2017. 2) Systemic chemotherapy with carboplatin for AUC of 5, paclitaxel 175 mg/M2 and Keytruda 200 mg IV every 3 weeks.  First dose March 12, 2017, status post 4 cycles with partial response.  CURRENT THERAPY: Maintenance treatment with single agent Keytruda 200 mg IV every 3 weeks.  First cycle June 11, 2017.  Status post 16 cycles.  INTERVAL HISTORY: Aaron Sellers 67 y.o. male returns to the clinic today for follow-up visit accompanied by his wife.  The patient is feeling fine today with no concerning complaints.  He is taking Lasix at regular basis for swelling of the lower extremities.  He denied having any chest pain, shortness of breath, cough or hemoptysis.  He denied having any fever or chills.  He has no nausea, vomiting, diarrhea or constipation.  He continues to tolerate his treatment with single agent Ketruda (pembrolizumab) fairly well.  The patient is here today for evaluation before starting cycle #17.  MEDICAL HISTORY: Past Medical History:  Diagnosis Date  . Arthritis   . Atrial flutter ([place])   . BPH (benign prostatic hypertrophy) with urinary retention   . Cancer ([place])   . Chronic kidney disease    hx of kidney stones  2011  . COPD (chronic obstructive pulmonary disease) ([place])    has been smoking since he was 22 ish--  . High cholesterol   . Hypertension    dx around 2011  . Squamous cell carcinoma of right lung (HCC)     ALLERGIES:   is allergic to ciprofloxacin; adhesive [tape]; and codeine.  MEDICATIONS:  Current Outpatient Medications  Medication Sig Dispense Refill  . acetaminophen (TYLENOL) 500 MG tablet Take 1,000 mg by mouth every 6 (six) hours as needed for moderate pain.    Marland Kitchen amiodarone (PACERONE) 200 MG tablet Starting 06/13/17, TAKE 200 MG DAILY 90 tablet 2  . atorvastatin (LIPITOR) 10 MG tablet Take 10 mg by mouth every evening.     . Cyanocobalamin (VITAMIN B-12 PO) Take 1 tablet by mouth daily.    . famotidine (PEPCID) 10 MG tablet Take 10 mg by mouth as needed for heartburn or indigestion.    . furosemide (LASIX) 40 MG tablet Take 1 tablet (40 mg total) by mouth daily as needed. 90 tablet 3  . gabapentin (NEURONTIN) 300 MG capsule TAKE 1 CAPSULE BY MOUTH THREE TIMES DAILY 90 capsule 0  . loratadine (CLARITIN) 10 MG tablet Take 10 mg by mouth daily.    . tamsulosin (FLOMAX) 0.4 MG CAPS capsule Take 0.4 mg by mouth daily.     No current facility-administered medications for this visit.    Facility-Administered Medications Ordered in Other Visits  Medication Dose Route Frequency Provider Last Rate Last Dose  . heparin lock flush 100 unit/mL  500 Units Intracatheter Once PRN Curt Bears, MD      . sodium chloride flush (NS) 0.9 % injection 10 mL  10 mL Intracatheter PRN Curt Bears, MD        SURGICAL HISTORY:  Past  Surgical History:  Procedure Laterality Date  . HERNIA REPAIR     umbilical hernia  04/9620  . INCISION AND DRAINAGE ABSCESS     "down the middle" of his buttocks  2015  . TOTAL KNEE ARTHROPLASTY Left 10/29/2014   Procedure: TOTAL KNEE ARTHROPLASTY;  Surgeon: Dorna Leitz, MD;  Location: [facility];  Service: Orthopedics;  Laterality: Left;    REVIEW OF SYSTEMS:  A comprehensive review of systems was negative.   PHYSICAL EXAMINATION: General appearance: alert, cooperative and no distress Head: Normocephalic, without obvious abnormality, atraumatic Neck: no adenopathy, no JVD, supple,  symmetrical, trachea midline and thyroid not enlarged, symmetric, no tenderness/mass/nodules Lymph nodes: Cervical, supraclavicular, and axillary nodes normal. Resp: clear to auscultation bilaterally Back: symmetric, no curvature. ROM normal. No CVA tenderness. Cardio: regular rate and rhythm, S1, S2 normal, no murmur, click, rub or gallop GI: soft, non-tender; bowel sounds normal; no masses,  no organomegaly Extremities: extremities normal, atraumatic, no cyanosis or edema  ECOG PERFORMANCE STATUS: 1 - Symptomatic but completely ambulatory  Blood pressure 121/66, pulse 64, temperature (!) 97.4 F (36.3 C), temperature source Oral, resp. rate 18, height 6\' 9"  (2.057 m), weight 296 lb 6.4 oz (134.4 kg), SpO2 97 %.  LABORATORY DATA: Lab Results  Component Value Date   WBC 4.3 02/18/2018   HGB 14.2 02/18/2018   HCT 43.4 02/18/2018   MCV 84.4 02/18/2018   PLT 162 02/18/2018      Chemistry      Component Value Date/Time   NA 139 02/18/2018 1017   NA 137 04/03/2017 1030   K 3.7 02/18/2018 1017   K 4.1 04/03/2017 1030   CL 103 02/18/2018 1017   CO2 27 02/18/2018 1017   CO2 27 04/03/2017 1030   BUN 16 02/18/2018 1017   BUN 15.5 04/03/2017 1030   CREATININE 1.16 02/18/2018 1017   CREATININE 0.8 04/03/2017 1030      Component Value Date/Time   CALCIUM 9.1 02/18/2018 1017   CALCIUM 9.4 04/03/2017 1030   ALKPHOS 98 02/18/2018 1017   ALKPHOS 97 04/03/2017 1030   AST 40 02/18/2018 1017   AST 14 04/03/2017 1030   ALT 43 02/18/2018 1017   ALT 21 04/03/2017 1030   BILITOT 1.0 02/18/2018 1017   BILITOT 0.67 04/03/2017 1030       RADIOGRAPHIC STUDIES: Ct Chest W Contrast  Result Date: 01/27/2018 CLINICAL DATA:  Non-small-cell lung cancer diagnosed in 2018. Chemotherapy and radiation therapy complete. Immunotherapy in progress. Asymptomatic. EXAM: CT CHEST, ABDOMEN, AND PELVIS WITH CONTRAST TECHNIQUE: Multidetector CT imaging of the chest, abdomen and pelvis was performed  following the standard protocol during bolus administration of intravenous contrast. CONTRAST:  173mL OMNIPAQUE IOHEXOL 300 MG/ML  SOLN COMPARISON:  Multiple priors, most recent 11/25/2017. FINDINGS: CT CHEST FINDINGS Cardiovascular: Aortic and branch vessel atherosclerosis. Borderline cardiomegaly, without pericardial effusion. Lad coronary artery calcification. No central pulmonary embolism, on this non-dedicated study. Mediastinum/Nodes: No supraclavicular adenopathy. Left axillary node measures 1.7 x 2.2 cm today and is unchanged. A node within the subcarinal station with extension into the azygoesophageal recess is unchanged at 11 mm on image 33/3. No hilar adenopathy. Lungs/Pleura: No pleural fluid. Right upper lobe lung nodule measures 1.5 x 1.1 cm on image 54/8 and is felt to be similar 1.5 x 1.0 cm on the prior. Smaller pulmonary nodules are primarily similar. Examples at 3 mm in the right upper lobe on image 49/8, 4 mm in the right upper lobe on image 55/8 and 3 mm  in the left upper lobe on image 69/8. However, there are new pulmonary nodules identified. Example in the medial left apex at 7 mm on image 37/8 and a vague 8 mm posterior right upper lobe pulmonary nodule on image 51/8. Musculoskeletal: No acute osseous abnormality. CT ABDOMEN PELVIS FINDINGS Hepatobiliary: Medial segment left liver lobe subtle atrophy. No focal liver lesion. Normal gallbladder, without biliary ductal dilatation. Pancreas: Normal, without mass or ductal dilatation. Spleen: Normal in size, without focal abnormality. Adrenals/Urinary Tract: Normal left adrenal gland. Right adrenal thickening and nodularity, including at maximally 3.1 x 1.6 cm, similar. Upper/interpolar right renal cyst. Punctate right renal collecting system calculus. Normal left kidney, without hydronephrosis. Normal urinary bladder. Stomach/Bowel: Normal stomach, without wall thickening. Scattered colonic diverticula. Normal terminal ileum and appendix. Normal  small bowel. Vascular/Lymphatic: Aortic and branch vessel atherosclerosis. Nonaneurysmal infrarenal aortic dilatation at 2.8 cm is similar. Portacaval 1.6 cm node is similar 1.7 cm on the prior. 1.8 cm gastrohepatic ligament node on image 67/3 is similar 1.7 cm on the prior. 1.3 cm right external iliac node on image 127/3 is similar 1.4 cm on the prior. Reproductive: Normal prostate. Other: No significant free fluid. No evidence of omental or peritoneal disease. Musculoskeletal: Lumbosacral spondylosis. IMPRESSION: 1. Similar size of a right upper lobe pulmonary nodule. 2. The majority of smaller pulmonary nodules are similar. However, there are multiple new pulmonary nodules, suspicious for progressive metastasis. Given somewhat ill-defined appearance of the vague new left-sided nodule, superimposed infection should be clinically excluded. 3. Similar left axillary and borderline mediastinal adenopathy, nonspecific. 4. No evidence of progressive metastasis within the abdomen or pelvis. Mild upper abdominal adenopathy and subtle medial segment left liver lobe atrophy. Question reactive adenopathy in the setting of mild cirrhosis. Correlate with risk factors. Mild right external iliac adenopathy is similar and nonspecific. 5. Right adrenal enlargement, likely due to adenoma or adenomas when correlated with prior PET. 6. Coronary artery atherosclerosis. Aortic Atherosclerosis (ICD10-I70.0). 7. Right nephrolithiasis. Electronically Signed   By: Abigail Miyamoto M.D.   On: 01/27/2018 08:40   Ct Abdomen Pelvis W Contrast  Result Date: 01/27/2018 CLINICAL DATA:  Non-small-cell lung cancer diagnosed in 2018. Chemotherapy and radiation therapy complete. Immunotherapy in progress. Asymptomatic. EXAM: CT CHEST, ABDOMEN, AND PELVIS WITH CONTRAST TECHNIQUE: Multidetector CT imaging of the chest, abdomen and pelvis was performed following the standard protocol during bolus administration of intravenous contrast. CONTRAST:  121mL  OMNIPAQUE IOHEXOL 300 MG/ML  SOLN COMPARISON:  Multiple priors, most recent 11/25/2017. FINDINGS: CT CHEST FINDINGS Cardiovascular: Aortic and branch vessel atherosclerosis. Borderline cardiomegaly, without pericardial effusion. Lad coronary artery calcification. No central pulmonary embolism, on this non-dedicated study. Mediastinum/Nodes: No supraclavicular adenopathy. Left axillary node measures 1.7 x 2.2 cm today and is unchanged. A node within the subcarinal station with extension into the azygoesophageal recess is unchanged at 11 mm on image 33/3. No hilar adenopathy. Lungs/Pleura: No pleural fluid. Right upper lobe lung nodule measures 1.5 x 1.1 cm on image 54/8 and is felt to be similar 1.5 x 1.0 cm on the prior. Smaller pulmonary nodules are primarily similar. Examples at 3 mm in the right upper lobe on image 49/8, 4 mm in the right upper lobe on image 55/8 and 3 mm in the left upper lobe on image 69/8. However, there are new pulmonary nodules identified. Example in the medial left apex at 7 mm on image 37/8 and a vague 8 mm posterior right upper lobe pulmonary nodule on image 51/8. Musculoskeletal: No acute osseous  abnormality. CT ABDOMEN PELVIS FINDINGS Hepatobiliary: Medial segment left liver lobe subtle atrophy. No focal liver lesion. Normal gallbladder, without biliary ductal dilatation. Pancreas: Normal, without mass or ductal dilatation. Spleen: Normal in size, without focal abnormality. Adrenals/Urinary Tract: Normal left adrenal gland. Right adrenal thickening and nodularity, including at maximally 3.1 x 1.6 cm, similar. Upper/interpolar right renal cyst. Punctate right renal collecting system calculus. Normal left kidney, without hydronephrosis. Normal urinary bladder. Stomach/Bowel: Normal stomach, without wall thickening. Scattered colonic diverticula. Normal terminal ileum and appendix. Normal small bowel. Vascular/Lymphatic: Aortic and branch vessel atherosclerosis. Nonaneurysmal infrarenal  aortic dilatation at 2.8 cm is similar. Portacaval 1.6 cm node is similar 1.7 cm on the prior. 1.8 cm gastrohepatic ligament node on image 67/3 is similar 1.7 cm on the prior. 1.3 cm right external iliac node on image 127/3 is similar 1.4 cm on the prior. Reproductive: Normal prostate. Other: No significant free fluid. No evidence of omental or peritoneal disease. Musculoskeletal: Lumbosacral spondylosis. IMPRESSION: 1. Similar size of a right upper lobe pulmonary nodule. 2. The majority of smaller pulmonary nodules are similar. However, there are multiple new pulmonary nodules, suspicious for progressive metastasis. Given somewhat ill-defined appearance of the vague new left-sided nodule, superimposed infection should be clinically excluded. 3. Similar left axillary and borderline mediastinal adenopathy, nonspecific. 4. No evidence of progressive metastasis within the abdomen or pelvis. Mild upper abdominal adenopathy and subtle medial segment left liver lobe atrophy. Question reactive adenopathy in the setting of mild cirrhosis. Correlate with risk factors. Mild right external iliac adenopathy is similar and nonspecific. 5. Right adrenal enlargement, likely due to adenoma or adenomas when correlated with prior PET. 6. Coronary artery atherosclerosis. Aortic Atherosclerosis (ICD10-I70.0). 7. Right nephrolithiasis. Electronically Signed   By: Abigail Miyamoto M.D.   On: 01/27/2018 08:40    ASSESSMENT AND PLAN: This is a very pleasant 67 years old white male with a stage IV non-small cell lung cancer, squamous cell carcinoma.  The patient is currently undergoing systemic chemotherapy with carboplatin, paclitaxel and Keytruda status post 4 cycles with partial response. He is currently undergoing maintenance treatment with single agent Keytruda status post 16 cycles. The patient continues to tolerate this treatment well with no concerning adverse effects. I recommended for him to proceed with cycle #17 today as  scheduled. He will come back for follow-up visit in 3 weeks for evaluation before starting the next cycle of his treatment. The patient was advised to call immediately if he has any concerning symptoms in the interval. The patient voices understanding of current disease status and treatment options and is in agreement with the current care plan. All questions were answered. The patient knows to call the clinic with any problems, questions or concerns. We can certainly see the patient much sooner if necessary.  Disclaimer: This note was dictated with voice recognition software. Similar sounding words can inadvertently be transcribed and may not be corrected upon review.

## 2018-02-18 NOTE — Patient Instructions (Signed)
[facility] Discharge Instructions for Patients Receiving Chemotherapy  Today you received the following chemotherapy agents:  Keytruda.  To help prevent nausea and vomiting after your treatment, we encourage you to take your nausea medication as directed.   If you develop nausea and vomiting that is not controlled by your nausea medication, call the clinic.   BELOW ARE SYMPTOMS THAT SHOULD BE REPORTED IMMEDIATELY:  *FEVER GREATER THAN 100.5 F  *CHILLS WITH OR WITHOUT FEVER  NAUSEA AND VOMITING THAT IS NOT CONTROLLED WITH YOUR NAUSEA MEDICATION  *UNUSUAL SHORTNESS OF BREATH  *UNUSUAL BRUISING OR BLEEDING  TENDERNESS IN MOUTH AND THROAT WITH OR WITHOUT PRESENCE OF ULCERS  *URINARY PROBLEMS  *BOWEL PROBLEMS  UNUSUAL RASH Items with * indicate a potential emergency and should be followed up as soon as possible.  Feel free to call the clinic should you have any questions or concerns. The clinic phone number is [phone].  Please show the CHEMO ALERT CARD at check-in to the Emergency Department and triage nurse.    

## 2018-02-20 ENCOUNTER — Telehealth: Payer: Self-pay | Admitting: Internal Medicine

## 2018-02-20 NOTE — Telephone Encounter (Signed)
3 cycles already scheduled per 11/19 los - no additional appts added.

## 2018-03-05 ENCOUNTER — Encounter: Payer: Self-pay | Admitting: Cardiology

## 2018-03-05 ENCOUNTER — Ambulatory Visit (INDEPENDENT_AMBULATORY_CARE_PROVIDER_SITE_OTHER): Payer: Medicare Other | Admitting: Cardiology

## 2018-03-05 VITALS — BP 118/68 | HR 62 | Ht >= 80 in | Wt 298.2 lb

## 2018-03-05 DIAGNOSIS — R600 Localized edema: Secondary | ICD-10-CM

## 2018-03-05 DIAGNOSIS — I4892 Unspecified atrial flutter: Secondary | ICD-10-CM

## 2018-03-05 MED ORDER — DILTIAZEM HCL ER COATED BEADS 180 MG PO CP24: 180.0000 mg | ORAL_CAPSULE | Freq: Every day | ORAL | 1 refills | Status: DC

## 2018-03-05 NOTE — Patient Instructions (Signed)
Your physician wants you to follow-up in: [place] will receive a reminder letter in the mail two months in advance. If you don't receive a letter, please call our office to schedule the follow-up appointment.  Your physician has recommended you make the following change in your medication:   STOP AMIODARONE  START DILTIAZEM 180 MG DAILY   Thank you for choosing [facility]!!

## 2018-03-05 NOTE — Progress Notes (Signed)
Clinical Summary Aaron Sellers is a 67 y.o.male seen today for follow up of the following medical problems.    1. Paroxysmal aflutter - admission 05/2017 with perforated bowel, issues with aflutter with RVR. Similar issues with his heart rates when admitted with diverticulitispreviously - failed av nodal agents, started on IV amiodaroneand converted to oral amio.  -he had not been on anticoag. When I saw him in 12/2016 he had a partially perforated bowel and newly diagnosed lung mass combined with low CHADS2Vasc score of 1, and thus anticoag was not started at that time. - prior to recent admission, rates had been reasonably been controlled ondilt 300mg  daily.    - no recent palpitations.  - normal LFTs and TFTs last month.    2. Stage IV lung CA - followed by oncology, on chemo currently. From review of onc note 02/27/17 appears treatment is palliative.  3. Leg swelling - last visit started lasix 40mg  prn for swelling. Taking on average every other day, swelling is improving.  - R>L edema is chronic, prior US negative for DVT.     Past Medical History:  Diagnosis Date  . Arthritis   . Atrial flutter ([place])   . BPH (benign prostatic hypertrophy) with urinary retention   . Cancer ([place])   . Chronic kidney disease    hx of kidney stones  2011  . COPD (chronic obstructive pulmonary disease) ([place])    has been smoking since he was 22 ish--  . High cholesterol   . Hypertension    dx around 2011  . Squamous cell carcinoma of right lung (HCC)      Allergies  Allergen Reactions  . Ciprofloxacin     HIVES  . Adhesive [Tape] Other (See Comments)    Skin peels  . Codeine Other (See Comments)    Jitters, "skin crawling"     Current Outpatient Medications  Medication Sig Dispense Refill  . acetaminophen (TYLENOL) 500 MG tablet Take 1,000 mg by mouth every 6 (six) hours as needed for moderate pain.    Marland Kitchen amiodarone (PACERONE) 200 MG tablet Starting 06/13/17, TAKE  200 MG DAILY 90 tablet 2  . atorvastatin (LIPITOR) 10 MG tablet Take 10 mg by mouth every evening.     . Cyanocobalamin (VITAMIN B-12 PO) Take 1 tablet by mouth daily.    . famotidine (PEPCID) 10 MG tablet Take 10 mg by mouth as needed for heartburn or indigestion.    . furosemide (LASIX) 40 MG tablet Take 1 tablet (40 mg total) by mouth daily as needed. 90 tablet 3  . gabapentin (NEURONTIN) 300 MG capsule TAKE 1 CAPSULE BY MOUTH THREE TIMES DAILY 90 capsule 0  . loratadine (CLARITIN) 10 MG tablet Take 10 mg by mouth daily.    . tamsulosin (FLOMAX) 0.4 MG CAPS capsule Take 0.4 mg by mouth daily.     No current facility-administered medications for this visit.    Facility-Administered Medications Ordered in Other Visits  Medication Dose Route Frequency Provider Last Rate Last Dose  . heparin lock flush 100 unit/mL  500 Units Intracatheter Once PRN Curt Bears, MD      . sodium chloride flush (NS) 0.9 % injection 10 mL  10 mL Intracatheter PRN Curt Bears, MD         Past Surgical History:  Procedure Laterality Date  . HERNIA REPAIR     umbilical hernia  07/9700  . INCISION AND DRAINAGE ABSCESS     "down the middle"  of his buttocks  2015  . TOTAL KNEE ARTHROPLASTY Left 10/29/2014   Procedure: TOTAL KNEE ARTHROPLASTY;  Surgeon: Dorna Leitz, MD;  Location: [facility];  Service: Orthopedics;  Laterality: Left;     Allergies  Allergen Reactions  . Ciprofloxacin     HIVES  . Adhesive [Tape] Other (See Comments)    Skin peels  . Codeine Other (See Comments)    Jitters, "skin crawling"      Family History  Problem Relation Age of Onset  . Hypertension Mother   . CVA Father   . Lung cancer Sister   . Lung cancer Brother   . Hypertension Brother   . Hypertension Sister      Social History Aaron Sellers reports that he quit smoking about 13 months ago. His smoking use included cigarettes. He has a 40.00 pack-year smoking history. He has quit using smokeless tobacco.  His  smokeless tobacco use included snuff. Aaron Sellers reports that he does not drink alcohol.   Review of Systems CONSTITUTIONAL: No weight loss, fever, chills, weakness or fatigue.  HEENT: Eyes: No visual loss, blurred vision, double vision or yellow sclerae.No hearing loss, sneezing, congestion, runny nose or sore throat.  SKIN: No rash or itching.  CARDIOVASCULAR: per hpi RESPIRATORY: No shortness of breath, cough or sputum.  GASTROINTESTINAL: No anorexia, nausea, vomiting or diarrhea. No abdominal pain or blood.  GENITOURINARY: No burning on urination, no polyuria NEUROLOGICAL: No headache, dizziness, syncope, paralysis, ataxia, numbness or tingling in the extremities. No change in bowel or bladder control.  MUSCULOSKELETAL: No muscle, back pain, joint pain or stiffness.  LYMPHATICS: No enlarged nodes. No history of splenectomy.  PSYCHIATRIC: No history of depression or anxiety.  ENDOCRINOLOGIC: No reports of sweating, cold or heat intolerance. No polyuria or polydipsia.  Marland Kitchen   Physical Examination Vitals:   03/05/18 1004  BP: 118/68  Pulse: 62  SpO2: 95%   Vitals:   03/05/18 1004  Weight: 298 lb 3.2 oz (135.3 kg)  Height: 6\' 8"  (2.032 m)    Gen: resting comfortably, no acute distress HEENT: no scleral icterus, pupils equal round and reactive, no palptable cervical adenopathy,  CV: RRR, no m/r/g, no jvd Resp: Clear to auscultation bilaterally GI: abdomen is soft, non-tender, non-distended, normal bowel sounds, no hepatosplenomegaly MSK: extremities are warm, no edema.  Skin: warm, no rash Neuro:  no focal deficits Psych: appropriate affect     Assessment and Plan  1. Paroxysmal aflutter - recent exacerbations due to admission with diverticulitis and bowel perforation.  - prior to recent admission, had been reasonably well controlled with dilt 300mg  daily. During last admission with bowel perforation failed av nodal agents, started on amio and now on oral amio - we will  d/c amio, start dilt 180mg  daily.  - no anticoag due to low CHADS2Vasc score of 1 and also recurrent issues with perforated bowel.  - ekg today shows NSR.   2. LE edema -  Prior echo showed normal LVEF, suspect he has some diastolic dysfunction though echo could not technically indentify - continue prn lasix.    F/u 6 months  Arnoldo Lenis, M.D.

## 2018-03-11 ENCOUNTER — Inpatient Hospital Stay: Payer: Medicare Other | Attending: Oncology

## 2018-03-11 ENCOUNTER — Encounter: Payer: Self-pay | Admitting: Oncology

## 2018-03-11 ENCOUNTER — Inpatient Hospital Stay: Payer: Medicare Other

## 2018-03-11 ENCOUNTER — Inpatient Hospital Stay (HOSPITAL_BASED_OUTPATIENT_CLINIC_OR_DEPARTMENT_OTHER): Payer: Medicare Other | Admitting: Oncology

## 2018-03-11 ENCOUNTER — Other Ambulatory Visit: Payer: Self-pay | Admitting: Oncology

## 2018-03-11 VITALS — BP 123/69 | HR 58 | Temp 98.2°F | Resp 20 | Ht >= 80 in | Wt 297.1 lb

## 2018-03-11 DIAGNOSIS — Z5112 Encounter for antineoplastic immunotherapy: Secondary | ICD-10-CM

## 2018-03-11 DIAGNOSIS — C3491 Malignant neoplasm of unspecified part of right bronchus or lung: Secondary | ICD-10-CM

## 2018-03-11 DIAGNOSIS — Z923 Personal history of irradiation: Secondary | ICD-10-CM | POA: Diagnosis not present

## 2018-03-11 DIAGNOSIS — Z79899 Other long term (current) drug therapy: Secondary | ICD-10-CM

## 2018-03-11 DIAGNOSIS — J329 Chronic sinusitis, unspecified: Secondary | ICD-10-CM

## 2018-03-11 DIAGNOSIS — I129 Hypertensive chronic kidney disease with stage 1 through stage 4 chronic kidney disease, or unspecified chronic kidney disease: Secondary | ICD-10-CM | POA: Diagnosis not present

## 2018-03-11 DIAGNOSIS — N189 Chronic kidney disease, unspecified: Secondary | ICD-10-CM | POA: Diagnosis not present

## 2018-03-11 DIAGNOSIS — Z792 Long term (current) use of antibiotics: Secondary | ICD-10-CM | POA: Diagnosis not present

## 2018-03-11 DIAGNOSIS — J449 Chronic obstructive pulmonary disease, unspecified: Secondary | ICD-10-CM

## 2018-03-11 DIAGNOSIS — Z87442 Personal history of urinary calculi: Secondary | ICD-10-CM | POA: Insufficient documentation

## 2018-03-11 DIAGNOSIS — M1990 Unspecified osteoarthritis, unspecified site: Secondary | ICD-10-CM | POA: Insufficient documentation

## 2018-03-11 DIAGNOSIS — E7800 Pure hypercholesterolemia, unspecified: Secondary | ICD-10-CM | POA: Diagnosis not present

## 2018-03-11 DIAGNOSIS — Z9221 Personal history of antineoplastic chemotherapy: Secondary | ICD-10-CM

## 2018-03-11 DIAGNOSIS — C7951 Secondary malignant neoplasm of bone: Secondary | ICD-10-CM | POA: Diagnosis not present

## 2018-03-11 DIAGNOSIS — C3411 Malignant neoplasm of upper lobe, right bronchus or lung: Secondary | ICD-10-CM | POA: Insufficient documentation

## 2018-03-11 DIAGNOSIS — I4892 Unspecified atrial flutter: Secondary | ICD-10-CM

## 2018-03-11 DIAGNOSIS — R1011 Right upper quadrant pain: Secondary | ICD-10-CM | POA: Diagnosis not present

## 2018-03-11 DIAGNOSIS — I2510 Atherosclerotic heart disease of native coronary artery without angina pectoris: Secondary | ICD-10-CM | POA: Insufficient documentation

## 2018-03-11 LAB — CBC WITH DIFFERENTIAL (CANCER CENTER ONLY)
ABS IMMATURE GRANULOCYTES: 0.01 10*3/uL (ref 0.00–0.07)
BASOS ABS: 0 10*3/uL (ref 0.0–0.1)
Basophils Relative: 1 %
Eosinophils Absolute: 0.2 10*3/uL (ref 0.0–0.5)
Eosinophils Relative: 3 %
HEMATOCRIT: 43.5 % (ref 39.0–52.0)
HEMOGLOBIN: 14.1 g/dL (ref 13.0–17.0)
IMMATURE GRANULOCYTES: 0 %
LYMPHS ABS: 1.5 10*3/uL (ref 0.7–4.0)
LYMPHS PCT: 33 %
MCH: 27.3 pg (ref 26.0–34.0)
MCHC: 32.4 g/dL (ref 30.0–36.0)
MCV: 84.3 fL (ref 80.0–100.0)
Monocytes Absolute: 0.6 10*3/uL (ref 0.1–1.0)
Monocytes Relative: 13 %
NEUTROS ABS: 2.2 10*3/uL (ref 1.7–7.7)
NRBC: 0 % (ref 0.0–0.2)
Neutrophils Relative %: 50 %
Platelet Count: 172 10*3/uL (ref 150–400)
RBC: 5.16 MIL/uL (ref 4.22–5.81)
RDW: 13.8 % (ref 11.5–15.5)
WBC Count: 4.5 10*3/uL (ref 4.0–10.5)

## 2018-03-11 LAB — CMP (CANCER CENTER ONLY)
ALT: 40 U/L (ref 0–44)
AST: 35 U/L (ref 15–41)
Albumin: 3.6 g/dL (ref 3.5–5.0)
Alkaline Phosphatase: 87 U/L (ref 38–126)
Anion gap: 8 (ref 5–15)
BUN: 13 mg/dL (ref 8–23)
CHLORIDE: 103 mmol/L (ref 98–111)
CO2: 27 mmol/L (ref 22–32)
CREATININE: 1.08 mg/dL (ref 0.61–1.24)
Calcium: 9.3 mg/dL (ref 8.9–10.3)
GFR, Estimated: >60 mL/min (ref >60)
Glucose, Bld: 135 mg/dL — ABNORMAL HIGH (ref 70–99)
POTASSIUM: 3.8 mmol/L (ref 3.5–5.1)
SODIUM: 138 mmol/L (ref 135–145)
Total Bilirubin: 1.1 mg/dL (ref 0.3–1.2)
Total Protein: 7 g/dL (ref 6.5–8.1)

## 2018-03-11 LAB — TSH: TSH: 3.619 u[IU]/mL (ref 0.320–4.118)

## 2018-03-11 MED ORDER — SODIUM CHLORIDE 0.9 % IV SOLN
Freq: Once | INTRAVENOUS | Status: AC
  Administered 2018-03-11: 12:00:00 via INTRAVENOUS
  Filled 2018-03-11: qty 250

## 2018-03-11 MED ORDER — SODIUM CHLORIDE 0.9 % IV SOLN
200.0000 mg | Freq: Once | INTRAVENOUS | Status: AC
  Administered 2018-03-11: 200 mg via INTRAVENOUS
  Filled 2018-03-11: qty 8

## 2018-03-11 MED ORDER — AMOXICILLIN-POT CLAVULANATE 875-125 MG PO TABS: 1.0000 | ORAL_TABLET | Freq: Two times a day (BID) | ORAL | 0 refills | Status: DC

## 2018-03-11 NOTE — Assessment & Plan Note (Signed)
This is a very pleasant 67 year old white male with a stage IV non-small cell lung cancer, squamous cell carcinoma.  The patient is currently undergoing systemic chemotherapy with carboplatin, paclitaxel and Keytruda status post 4 cycles with partial response. He is currently undergoing maintenance treatment with single agent Keytruda status post 17 cycles. The patient continues to tolerate this treatment well with no concerning adverse effects. I recommended for him to proceed with cycle #18 today as scheduled. He will have a restaging CT scan of chest, abdomen, pelvis prior to his next visit.  He will follow-up in 3 weeks for evaluation prior to cycle #19 of Keytruda and to review his restaging CT scan results.  For sinusitis, I have given him prescription for Augmentin 875 mg twice a day for 1 week.  He will let us know if his symptoms worsen.  The patient was advised to call immediately if he has any concerning symptoms in the interval. The patient voices understanding of current disease status and treatment options and is in agreement with the current care plan. All questions were answered. The patient knows to call the clinic with any problems, questions or concerns. We can certainly see the patient much sooner if necessary.

## 2018-03-11 NOTE — Progress Notes (Signed)
[facility] OFFICE PROGRESS NOTE  Sheryle Hail, PA-C [address] [place] [address]  DIAGNOSIS: Stage IV (T2a,N3,M1c)non-small cell lung cancer, squamous cell carcinoma presented with right upper lobe lung mass in addition to right hilar and mediastinal adenopathy as well as metastatic disease in the medial right thigh diagnosed in November 2018.  PRIOR THERAPY:  1) Palliative radiotherapy to the right lower extremity mass completed April 09, 2017. 2) Systemic chemotherapy with carboplatin for AUC of 5, paclitaxel 175 mg/M2 and Keytruda 200 mg IV every 3 weeks.  First dose March 12, 2017, status post 4 cycles with partial response.  CURRENT THERAPY: Maintenance treatment with single agent Keytruda 200 mg IV every 3 weeks.  First cycle June 11, 2017.  Status post 17 cycles.  INTERVAL HISTORY: Aaron Sellers 67 y.o. male returns for routine follow-up visit accompanied by his wife.  The patient is feeling fine today and has no specific complaints except for increased productive cough.  He reports that his cough is worsened over the past 3 days and he is bringing up green sputum.  Reports some increased sinus pressure.  He denies fevers and chills.  Denies chest pain, shortness of breath and hemoptysis.  Denies nausea, vomiting, constipation, diarrhea.  Denies recent weight loss or night sweats.  He is tolerating his treatment with single agent Keytruda fairly well.  The patient is here for evaluation prior to cycle #18 of Keytruda.  MEDICAL HISTORY: Past Medical History:  Diagnosis Date  . Arthritis   . Atrial flutter ([facility])   . BPH (benign prostatic hypertrophy) with urinary retention   . Cancer ([facility])   . Chronic kidney disease    hx of kidney stones  2011  . COPD (chronic obstructive pulmonary disease) ([facility])    has been smoking since he was 22 ish--  . High cholesterol   . Hypertension    dx around 2011  . Squamous cell carcinoma of right lung ([facility])      ALLERGIES:  is allergic to ciprofloxacin; adhesive [tape]; and codeine.  MEDICATIONS:  Current Outpatient Medications  Medication Sig Dispense Refill  . acetaminophen (TYLENOL) 500 MG tablet Take 1,000 mg by mouth every 6 (six) hours as needed for moderate pain.    Marland Kitchen atorvastatin (LIPITOR) 10 MG tablet Take 10 mg by mouth every evening.     . Cyanocobalamin (VITAMIN B-12 PO) Take 1 tablet by mouth daily.    Marland Kitchen diltiazem (CARDIZEM CD) 180 MG 24 hr capsule Take 1 capsule (180 mg total) by mouth daily. 90 capsule 1  . famotidine (PEPCID) 10 MG tablet Take 10 mg by mouth as needed for heartburn or indigestion.    . furosemide (LASIX) 40 MG tablet Take 1 tablet (40 mg total) by mouth daily as needed. 90 tablet 3  . gabapentin (NEURONTIN) 300 MG capsule TAKE 1 CAPSULE BY MOUTH THREE TIMES DAILY 90 capsule 0  . loratadine (CLARITIN) 10 MG tablet Take 10 mg by mouth daily.    . tamsulosin (FLOMAX) 0.4 MG CAPS capsule Take 0.4 mg by mouth daily.    Marland Kitchen amoxicillin-clavulanate (AUGMENTIN) 875-125 MG tablet Take 1 tablet by mouth 2 (two) times daily. 14 tablet 0   No current facility-administered medications for this visit.    Facility-Administered Medications Ordered in Other Visits  Medication Dose Route Frequency Provider Last Rate Last Dose  . heparin lock flush 100 unit/mL  500 Units Intracatheter Once PRN Curt Bears, MD      . sodium  chloride flush (NS) 0.9 % injection 10 mL  10 mL Intracatheter PRN Curt Bears, MD        SURGICAL HISTORY:  Past Surgical History:  Procedure Laterality Date  . HERNIA REPAIR     umbilical hernia  10/4079  . INCISION AND DRAINAGE ABSCESS     "down the middle" of his buttocks  2015  . TOTAL KNEE ARTHROPLASTY Left 10/29/2014   Procedure: TOTAL KNEE ARTHROPLASTY;  Surgeon: Dorna Leitz, MD;  Location: [facility];  Service: Orthopedics;  Laterality: Left;    REVIEW OF SYSTEMS:   Review of Systems  Constitutional: Negative for appetite change, chills,  fatigue, fever and unexpected weight change.  HENT:   Negative for mouth sores, nosebleeds, sore throat and trouble swallowing.  Positive for sinus pain. Eyes: Negative for eye problems and icterus.  Respiratory: Negative for hemoptysis, shortness of breath and wheezing.  Positive for cough. Cardiovascular: Negative for chest pain and leg swelling.  Gastrointestinal: Negative for abdominal pain, constipation, diarrhea, nausea and vomiting.  Genitourinary: Negative for bladder incontinence, difficulty urinating, dysuria, frequency and hematuria.   Musculoskeletal: Negative for back pain, gait problem, neck pain and neck stiffness.  Skin: Negative for itching and rash.  Neurological: Negative for dizziness, extremity weakness, gait problem, headaches, light-headedness and seizures.  Hematological: Negative for adenopathy. Does not bruise/bleed easily.  Psychiatric/Behavioral: Negative for confusion, depression and sleep disturbance. The patient is not nervous/anxious.     PHYSICAL EXAMINATION:  Blood pressure 123/69, pulse (!) 58, temperature 98.2 F (36.8 C), temperature source Oral, resp. rate 20, height 6\' 8"  (2.032 m), weight 297 lb 1.6 oz (134.8 kg), SpO2 96 %.  ECOG PERFORMANCE STATUS: 1 - Symptomatic but completely ambulatory  Physical Exam  Constitutional: Oriented to person, place, and time and well-developed, well-nourished, and in no distress. No distress.  HENT:  Head: Normocephalic and atraumatic.  Mouth/Throat: Oropharynx is clear and moist. No oropharyngeal exudate.  Eyes: Conjunctivae are normal. Right eye exhibits no discharge. Left eye exhibits no discharge. No scleral icterus.  Neck: Normal range of motion. Neck supple.  Cardiovascular: Normal rate, regular rhythm, normal heart sounds and intact distal pulses.   Pulmonary/Chest: Effort normal and breath sounds normal. No respiratory distress. No wheezes. No rales.  Abdominal: Soft. Bowel sounds are normal. Exhibits no  distension and no mass. There is no tenderness.  Musculoskeletal: Normal range of motion. Exhibits no edema.  Lymphadenopathy:    No cervical adenopathy.  Neurological: Alert and oriented to person, place, and time. Exhibits normal muscle tone. Gait normal. Coordination normal.  Skin: Skin is warm and dry. No rash noted. Not diaphoretic. No erythema. No pallor.  Psychiatric: Mood, memory and judgment normal.  Vitals reviewed.  LABORATORY DATA: Lab Results  Component Value Date   WBC 4.5 03/11/2018   HGB 14.1 03/11/2018   HCT 43.5 03/11/2018   MCV 84.3 03/11/2018   PLT 172 03/11/2018      Chemistry      Component Value Date/Time   NA 138 03/11/2018 1031   NA 137 04/03/2017 1030   K 3.8 03/11/2018 1031   K 4.1 04/03/2017 1030   CL 103 03/11/2018 1031   CO2 27 03/11/2018 1031   CO2 27 04/03/2017 1030   BUN 13 03/11/2018 1031   BUN 15.5 04/03/2017 1030   CREATININE 1.08 03/11/2018 1031   CREATININE 0.8 04/03/2017 1030      Component Value Date/Time   CALCIUM 9.3 03/11/2018 1031   CALCIUM 9.4 04/03/2017 1030  ALKPHOS 87 03/11/2018 1031   ALKPHOS 97 04/03/2017 1030   AST 35 03/11/2018 1031   AST 14 04/03/2017 1030   ALT 40 03/11/2018 1031   ALT 21 04/03/2017 1030   BILITOT 1.1 03/11/2018 1031   BILITOT 0.67 04/03/2017 1030       RADIOGRAPHIC STUDIES:  No results found.   ASSESSMENT/PLAN:  Stage IV squamous cell carcinoma of right lung [facility]) This is a very pleasant 67 year old white male with a stage IV non-small cell lung cancer, squamous cell carcinoma.  The patient is currently undergoing systemic chemotherapy with carboplatin, paclitaxel and Keytruda status post 4 cycles with partial response. He is currently undergoing maintenance treatment with single agent Keytruda status post 17 cycles. The patient continues to tolerate this treatment well with no concerning adverse effects. I recommended for him to proceed with cycle #18 today as scheduled. He will have  a restaging CT scan of chest, abdomen, pelvis prior to his next visit.  He will follow-up in 3 weeks for evaluation prior to cycle #19 of Keytruda and to review his restaging CT scan results.  For sinusitis, I have given him prescription for Augmentin 875 mg twice a day for 1 week.  He will let us know if his symptoms worsen.  The patient was advised to call immediately if he has any concerning symptoms in the interval. The patient voices understanding of current disease status and treatment options and is in agreement with the current care plan. All questions were answered. The patient knows to call the clinic with any problems, questions or concerns. We can certainly see the patient much sooner if necessary.   Orders Placed This Encounter  Procedures  . CT ABDOMEN PELVIS W CONTRAST    Standing Status:   Future    Standing Expiration Date:   03/12/2019    Order Specific Question:   If indicated for the ordered procedure, I authorize the administration of contrast media per Radiology protocol    Answer:   Yes    Order Specific Question:   Preferred imaging location?    Answer:   [facility]    Order Specific Question:   Radiology Contrast Protocol - do NOT remove file path    Answer:   \\[email or web address]    Order Specific Question:   ** REASON FOR EXAM (FREE TEXT)    Answer:   Lung cancer Restaging.  . CT CHEST W CONTRAST    Standing Status:   Future    Standing Expiration Date:   03/12/2019    Order Specific Question:   If indicated for the ordered procedure, I authorize the administration of contrast media per Radiology protocol    Answer:   Yes    Order Specific Question:   Preferred imaging location?    Answer:   [facility]    Order Specific Question:   Radiology Contrast Protocol - do NOT remove file path    Answer:   \\[email or web address]    Order Specific Question:   ** REASON FOR EXAM (FREE TEXT)    Answer:    Lung cancer. Restaging.     Mikey Bussing, DNP, AGPCNP-BC, AOCNP 03/11/18

## 2018-03-11 NOTE — Patient Instructions (Signed)
[facility] Discharge Instructions for Patients Receiving Chemotherapy  Today you received the following chemotherapy agent: Keytruda  To help prevent nausea and vomiting after your treatment, we encourage you to take your nausea medication as directed.   If you develop nausea and vomiting that is not controlled by your nausea medication, call the clinic.   BELOW ARE SYMPTOMS THAT SHOULD BE REPORTED IMMEDIATELY:  *FEVER GREATER THAN 100.5 F  *CHILLS WITH OR WITHOUT FEVER  NAUSEA AND VOMITING THAT IS NOT CONTROLLED WITH YOUR NAUSEA MEDICATION  *UNUSUAL SHORTNESS OF BREATH  *UNUSUAL BRUISING OR BLEEDING  TENDERNESS IN MOUTH AND THROAT WITH OR WITHOUT PRESENCE OF ULCERS  *URINARY PROBLEMS  *BOWEL PROBLEMS  UNUSUAL RASH Items with * indicate a potential emergency and should be followed up as soon as possible.  Feel free to call the clinic should you have any questions or concerns. The clinic phone number is [phone].  Please show the [facility] at check-in to the Emergency Department and triage nurse.

## 2018-03-12 ENCOUNTER — Telehealth: Payer: Self-pay | Admitting: Oncology

## 2018-03-12 NOTE — Telephone Encounter (Signed)
3 cycles already scheduled per 12/10 los. \

## 2018-03-24 ENCOUNTER — Telehealth: Payer: Self-pay | Admitting: Internal Medicine

## 2018-03-24 NOTE — Telephone Encounter (Signed)
MM PAL - moved 12/31 f/u to 10:15 am and adjusted lab. Spoke with patient/wife. Treatment adjustment pending.

## 2018-03-31 ENCOUNTER — Ambulatory Visit (HOSPITAL_COMMUNITY): Payer: Managed Care, Other (non HMO)

## 2018-03-31 ENCOUNTER — Ambulatory Visit (HOSPITAL_COMMUNITY)
Admission: RE | Admit: 2018-03-31 | Discharge: 2018-03-31 | Disposition: A | Discharge status: Home / Self Care | Payer: Medicare Other | Source: Ambulatory Visit | Attending: Oncology | Admitting: Oncology

## 2018-03-31 DIAGNOSIS — C3491 Malignant neoplasm of unspecified part of right bronchus or lung: Secondary | ICD-10-CM | POA: Diagnosis not present

## 2018-03-31 MED ORDER — SODIUM CHLORIDE (PF) 0.9 % IJ SOLN
INTRAMUSCULAR | Status: AC
  Filled 2018-03-31: qty 50

## 2018-03-31 MED ORDER — IOHEXOL 300 MG/ML  SOLN
100.0000 mL | Freq: Once | INTRAMUSCULAR | Status: AC | PRN
  Administered 2018-03-31: 100 mL via INTRAVENOUS

## 2018-04-01 ENCOUNTER — Inpatient Hospital Stay (HOSPITAL_BASED_OUTPATIENT_CLINIC_OR_DEPARTMENT_OTHER): Payer: Medicare Other | Admitting: Internal Medicine

## 2018-04-01 ENCOUNTER — Telehealth: Payer: Self-pay | Admitting: Internal Medicine

## 2018-04-01 ENCOUNTER — Encounter: Payer: Self-pay | Admitting: Internal Medicine

## 2018-04-01 ENCOUNTER — Inpatient Hospital Stay: Payer: Medicare Other

## 2018-04-01 VITALS — BP 128/71 | HR 58 | Temp 97.8°F | Resp 20 | Ht >= 80 in | Wt 298.3 lb

## 2018-04-01 VITALS — HR 60

## 2018-04-01 DIAGNOSIS — C3491 Malignant neoplasm of unspecified part of right bronchus or lung: Secondary | ICD-10-CM

## 2018-04-01 DIAGNOSIS — C7951 Secondary malignant neoplasm of bone: Secondary | ICD-10-CM | POA: Diagnosis not present

## 2018-04-01 DIAGNOSIS — R600 Localized edema: Secondary | ICD-10-CM

## 2018-04-01 DIAGNOSIS — I129 Hypertensive chronic kidney disease with stage 1 through stage 4 chronic kidney disease, or unspecified chronic kidney disease: Secondary | ICD-10-CM

## 2018-04-01 DIAGNOSIS — Z923 Personal history of irradiation: Secondary | ICD-10-CM | POA: Diagnosis not present

## 2018-04-01 DIAGNOSIS — E7800 Pure hypercholesterolemia, unspecified: Secondary | ICD-10-CM

## 2018-04-01 DIAGNOSIS — M1990 Unspecified osteoarthritis, unspecified site: Secondary | ICD-10-CM

## 2018-04-01 DIAGNOSIS — Z9221 Personal history of antineoplastic chemotherapy: Secondary | ICD-10-CM

## 2018-04-01 DIAGNOSIS — J449 Chronic obstructive pulmonary disease, unspecified: Secondary | ICD-10-CM

## 2018-04-01 DIAGNOSIS — N189 Chronic kidney disease, unspecified: Secondary | ICD-10-CM

## 2018-04-01 DIAGNOSIS — Z5112 Encounter for antineoplastic immunotherapy: Secondary | ICD-10-CM

## 2018-04-01 DIAGNOSIS — Z87442 Personal history of urinary calculi: Secondary | ICD-10-CM

## 2018-04-01 DIAGNOSIS — I2510 Atherosclerotic heart disease of native coronary artery without angina pectoris: Secondary | ICD-10-CM

## 2018-04-01 DIAGNOSIS — R1011 Right upper quadrant pain: Secondary | ICD-10-CM

## 2018-04-01 DIAGNOSIS — Z79899 Other long term (current) drug therapy: Secondary | ICD-10-CM

## 2018-04-01 DIAGNOSIS — I4892 Unspecified atrial flutter: Secondary | ICD-10-CM

## 2018-04-01 DIAGNOSIS — C3411 Malignant neoplasm of upper lobe, right bronchus or lung: Secondary | ICD-10-CM | POA: Diagnosis not present

## 2018-04-01 LAB — CBC WITH DIFFERENTIAL (CANCER CENTER ONLY)
Abs Immature Granulocytes: 0 10*3/uL (ref 0.00–0.07)
BASOS ABS: 0.1 10*3/uL (ref 0.0–0.1)
Basophils Relative: 1 %
Eosinophils Absolute: 0.2 10*3/uL (ref 0.0–0.5)
Eosinophils Relative: 4 %
HCT: 42.2 % (ref 39.0–52.0)
Hemoglobin: 13.8 g/dL (ref 13.0–17.0)
Immature Granulocytes: 0 %
Lymphocytes Relative: 35 %
Lymphs Abs: 1.4 10*3/uL (ref 0.7–4.0)
MCH: 27.8 pg (ref 26.0–34.0)
MCHC: 32.7 g/dL (ref 30.0–36.0)
MCV: 84.9 fL (ref 80.0–100.0)
Monocytes Absolute: 0.5 10*3/uL (ref 0.1–1.0)
Monocytes Relative: 11 %
NEUTROS ABS: 1.9 10*3/uL (ref 1.7–7.7)
Neutrophils Relative %: 49 %
Platelet Count: 149 10*3/uL — ABNORMAL LOW (ref 150–400)
RBC: 4.97 MIL/uL (ref 4.22–5.81)
RDW: 14.1 % (ref 11.5–15.5)
WBC Count: 4 10*3/uL (ref 4.0–10.5)
nRBC: 0 % (ref 0.0–0.2)

## 2018-04-01 LAB — CMP (CANCER CENTER ONLY)
ALT: 39 U/L (ref 0–44)
AST: 36 U/L (ref 15–41)
Albumin: 3.5 g/dL (ref 3.5–5.0)
Alkaline Phosphatase: 97 U/L (ref 38–126)
Anion gap: 9 (ref 5–15)
BUN: 14 mg/dL (ref 8–23)
CO2: 24 mmol/L (ref 22–32)
Calcium: 9 mg/dL (ref 8.9–10.3)
Chloride: 107 mmol/L (ref 98–111)
Creatinine: 0.95 mg/dL (ref 0.61–1.24)
GFR, Estimated: >60 mL/min (ref >60)
Glucose, Bld: 152 mg/dL — ABNORMAL HIGH (ref 70–99)
Potassium: 3.6 mmol/L (ref 3.5–5.1)
SODIUM: 140 mmol/L (ref 135–145)
Total Bilirubin: 1 mg/dL (ref 0.3–1.2)
Total Protein: 6.8 g/dL (ref 6.5–8.1)

## 2018-04-01 LAB — TSH: TSH: 3.713 u[IU]/mL (ref 0.320–4.118)

## 2018-04-01 MED ORDER — SODIUM CHLORIDE 0.9 % IV SOLN
Freq: Once | INTRAVENOUS | Status: AC
  Administered 2018-04-01: 12:00:00 via INTRAVENOUS
  Filled 2018-04-01: qty 250

## 2018-04-01 MED ORDER — SODIUM CHLORIDE 0.9 % IV SOLN
200.0000 mg | Freq: Once | INTRAVENOUS | Status: AC
  Administered 2018-04-01: 200 mg via INTRAVENOUS
  Filled 2018-04-01: qty 8

## 2018-04-01 NOTE — Patient Instructions (Signed)
[facility] Discharge Instructions for Patients Receiving Chemotherapy  Today you received the following chemotherapy agent: Keytruda  To help prevent nausea and vomiting after your treatment, we encourage you to take your nausea medication as directed.   If you develop nausea and vomiting that is not controlled by your nausea medication, call the clinic.   BELOW ARE SYMPTOMS THAT SHOULD BE REPORTED IMMEDIATELY:  *FEVER GREATER THAN 100.5 F  *CHILLS WITH OR WITHOUT FEVER  NAUSEA AND VOMITING THAT IS NOT CONTROLLED WITH YOUR NAUSEA MEDICATION  *UNUSUAL SHORTNESS OF BREATH  *UNUSUAL BRUISING OR BLEEDING  TENDERNESS IN MOUTH AND THROAT WITH OR WITHOUT PRESENCE OF ULCERS  *URINARY PROBLEMS  *BOWEL PROBLEMS  UNUSUAL RASH Items with * indicate a potential emergency and should be followed up as soon as possible.  Feel free to call the clinic should you have any questions or concerns. The clinic phone number is [phone].  Please show the [facility] at check-in to the Emergency Department and triage nurse.

## 2018-04-01 NOTE — Progress Notes (Signed)
[facility] [phone]   [phone]  OFFICE PROGRESS NOTE  Sheryle Hail, PA-C [address] [place] [address]  DIAGNOSIS: Stage IV (T2a,N3,M1c)non-small cell lung cancer, squamous cell carcinoma presented with right upper lobe lung mass in addition to right hilar and mediastinal adenopathy as well as metastatic disease in the medial right thigh diagnosed in November 2018.  PRIOR THERAPY:  1) Palliative radiotherapy to the right lower extremity mass completed April 09, 2017. 2) Systemic chemotherapy with carboplatin for AUC of 5, paclitaxel 175 mg/M2 and Keytruda 200 mg IV every 3 weeks.  First dose March 12, 2017, status post 4 cycles with partial response.  CURRENT THERAPY: Maintenance treatment with single agent Keytruda 200 mg IV every 3 weeks.  First cycle June 11, 2017.  Status post 18 cycles.  INTERVAL HISTORY: Alter Moss 67 y.o. male returns to the clinic today for follow-up visit accompanied by his wife.  The patient has no complaints today except for intermittent right upper quadrant pain.  He denied having any current chest pain, shortness of breath, cough or hemoptysis.  He denied having any fever or chills.  He has no nausea, vomiting, diarrhea or constipation.  He has no headache or visual changes.  He continues to tolerate his treatment with single agent Keytruda fairly well.  The patient had repeat CT scan of the chest, abdomen and pelvis performed recently.  He is here for evaluation and discussion of his scan results and treatment options.    MEDICAL HISTORY: Past Medical History:  Diagnosis Date  . Arthritis   . Atrial flutter ([facility])   . BPH (benign prostatic hypertrophy) with urinary retention   . Cancer ([facility])   . Chronic kidney disease    hx of kidney stones  2011  . COPD (chronic obstructive pulmonary disease) ([facility])    has been smoking since he was 22 ish--  . High cholesterol   . Hypertension    dx around 2011  . Squamous cell carcinoma of right lung ([facility])     ALLERGIES:  is allergic to ciprofloxacin; adhesive [tape]; and codeine.  MEDICATIONS:  Current Outpatient Medications  Medication Sig Dispense Refill  . acetaminophen (TYLENOL) 500 MG tablet Take 1,000 mg by mouth every 6 (six) hours as needed for moderate pain.    Marland Kitchen amoxicillin-clavulanate (AUGMENTIN) 875-125 MG tablet Take 1 tablet by mouth 2 (two) times daily. 14 tablet 0  . atorvastatin (LIPITOR) 10 MG tablet Take 10 mg by mouth every evening.     . Cyanocobalamin (VITAMIN B-12 PO) Take 1 tablet by mouth daily.    Marland Kitchen diltiazem (CARDIZEM CD) 180 MG 24 hr capsule Take 1 capsule (180 mg total) by mouth daily. 90 capsule 1  . famotidine (PEPCID) 10 MG tablet Take 10 mg by mouth as needed for heartburn or indigestion.    . furosemide (LASIX) 40 MG tablet Take 1 tablet (40 mg total) by mouth daily as needed. 90 tablet 3  . gabapentin (NEURONTIN) 300 MG capsule TAKE 1 CAPSULE BY MOUTH THREE TIMES DAILY 90 capsule 0  . loratadine (CLARITIN) 10 MG tablet Take 10 mg by mouth daily.    . tamsulosin (FLOMAX) 0.4 MG CAPS capsule Take 0.4 mg by mouth daily.     No current facility-administered medications for this visit.    Facility-Administered Medications Ordered in Other Visits  Medication Dose Route Frequency Provider Last Rate Last Dose  . heparin lock flush 100 unit/mL  500  Units Intracatheter Once PRN Curt Bears, MD      . sodium chloride flush (NS) 0.9 % injection 10 mL  10 mL Intracatheter PRN Curt Bears, MD        SURGICAL HISTORY:  Past Surgical History:  Procedure Laterality Date  . HERNIA REPAIR     umbilical hernia  06/3543  . INCISION AND DRAINAGE ABSCESS     "down the middle" of his buttocks  2015  . TOTAL KNEE ARTHROPLASTY Left 10/29/2014   Procedure: TOTAL KNEE ARTHROPLASTY;  Surgeon: Dorna Leitz, MD;  Location: [facility];  Service: Orthopedics;  Laterality: Left;    REVIEW OF SYSTEMS:   Constitutional: negative Eyes: negative Ears, nose, mouth, throat, and face: negative Respiratory: negative Cardiovascular: negative Gastrointestinal: negative Genitourinary:negative Integument/breast: negative Hematologic/lymphatic: negative Musculoskeletal:negative Neurological: negative Behavioral/Psych: negative Endocrine: negative Allergic/Immunologic: negative   PHYSICAL EXAMINATION: General appearance: alert, cooperative and no distress Head: Normocephalic, without obvious abnormality, atraumatic Neck: no adenopathy, no JVD, supple, symmetrical, trachea midline and thyroid not enlarged, symmetric, no tenderness/mass/nodules Lymph nodes: Cervical, supraclavicular, and axillary nodes normal. Resp: clear to auscultation bilaterally Back: symmetric, no curvature. ROM normal. No CVA tenderness. Cardio: regular rate and rhythm, S1, S2 normal, no murmur, click, rub or gallop GI: soft, non-tender; bowel sounds normal; no masses,  no organomegaly Extremities: extremities normal, atraumatic, no cyanosis or edema Neurologic: Alert and oriented X 3, normal strength and tone. Normal symmetric reflexes. Normal coordination and gait  ECOG PERFORMANCE STATUS: 1 - Symptomatic but completely ambulatory  Blood pressure 128/71, pulse (!) 58, temperature 97.8 F (36.6 C), temperature source Oral, resp. rate 20, height 6\' 8"  (2.032 m), weight 298 lb 4.8 oz (135.3 kg), SpO2 97 %.  LABORATORY DATA: Lab Results  Component Value Date   WBC 4.0 04/01/2018   HGB 13.8 04/01/2018   HCT 42.2 04/01/2018   MCV 84.9 04/01/2018   PLT 149 (L) 04/01/2018      Chemistry      Component Value Date/Time   NA 140 04/01/2018 0925   NA 137 04/03/2017 1030   K 3.6 04/01/2018 0925   K 4.1 04/03/2017 1030   CL 107 04/01/2018 0925   CO2 24 04/01/2018 0925   CO2 27 04/03/2017 1030   BUN 14 04/01/2018 0925   BUN 15.5 04/03/2017 1030   CREATININE 0.95 04/01/2018 0925   CREATININE 0.8 04/03/2017 1030        Component Value Date/Time   CALCIUM 9.0 04/01/2018 0925   CALCIUM 9.4 04/03/2017 1030   ALKPHOS 97 04/01/2018 0925   ALKPHOS 97 04/03/2017 1030   AST 36 04/01/2018 0925   AST 14 04/03/2017 1030   ALT 39 04/01/2018 0925   ALT 21 04/03/2017 1030   BILITOT 1.0 04/01/2018 0925   BILITOT 0.67 04/03/2017 1030       RADIOGRAPHIC STUDIES: Ct Chest W Contrast  Result Date: 03/31/2018 CLINICAL DATA:  Stage IV right upper lobe squamous cell lung carcinoma diagnosed November 2018 with ongoing chemotherapy and immunotherapy. Restaging. EXAM: CT CHEST, ABDOMEN, AND PELVIS WITH CONTRAST TECHNIQUE: Multidetector CT imaging of the chest, abdomen and pelvis was performed following the standard protocol during bolus administration of intravenous contrast. CONTRAST:  165mL OMNIPAQUE IOHEXOL 300 MG/ML  SOLN COMPARISON:  01/24/2018 CT chest, abdomen and pelvis. FINDINGS: CT CHEST FINDINGS Cardiovascular: Normal heart size. No significant pericardial effusion/thickening. Left anterior descending and right coronary atherosclerosis. Atherosclerotic nonaneurysmal thoracic aorta. Normal caliber pulmonary arteries. No central pulmonary emboli. Mediastinum/Nodes: Stable subcentimeter hypodense posterior upper right thyroid  lobe nodule. Unremarkable esophagus. No right axillary adenopathy. Stable enlarged 1.7 cm left axillary node (series 2/image 17). Mildly enlarged 1.3 cm subcarinal node (series 2/image 31), previously 1.3 cm using similar measurement technique, stable. No additional pathologically enlarged mediastinal nodes. Stable mildly enlarged 1.1 cm right hilar node (series 2/image 30). No left hilar adenopathy. Lungs/Pleura: No pneumothorax. No pleural effusion. No acute consolidative airspace disease or lung masses. Spiculated peripheral right upper lobe 1.7 cm solid pulmonary nodule (series 4/image 40), previously 1.7 cm using similar measurement technique, stable. A few scattered subcentimeter pulmonary nodules  in the upper lobes bilaterally, largest 7 mm in the medial apical left upper lobe (series 4/image 35), not appreciably changed in the interval. No new significant pulmonary nodules. Musculoskeletal: No aggressive appearing focal osseous lesions. Mild thoracic spondylosis. CT ABDOMEN PELVIS FINDINGS Hepatobiliary: Stable relative hypertrophy of the left liver lobe with questionable fine liver surface irregularity, can not exclude liver cirrhosis. No liver masses. Normal gallbladder with no radiopaque cholelithiasis. No biliary ductal dilatation. Pancreas: Normal, with no mass or duct dilation. Spleen: Normal size. No mass. Adrenals/Urinary Tract: Stable 3.1 cm right adrenal nodule, characterized as an adenoma 02/15/2017 PET-CT. No left adrenal nodules. No hydronephrosis. Simple 2.1 cm upper right renal cyst. Stable subcentimeter hypodense interpolar left renal cortical lesion, too small to characterize, considered benign. No new renal lesions. Normal nondistended bladder. Stomach/Bowel: Normal non-distended stomach. Normal caliber small bowel with no small bowel wall thickening. FX. Marked sigmoid diverticulosis, no large bowel wall thickening or acute pericolonic fat stranding. Oral contrast transits to the rectum. Vascular/Lymphatic: Atherosclerotic abdominal aorta with stable 3.1 cm infrarenal abdominal aortic aneurysm using similar measurement technique. Patent portal, splenic, hepatic and renal veins. Enlarged 2.0 cm portacaval node (series 2/image 72), stable using similar measurement technique. Stable enlarged 1.9 cm peripancreatic node (series 2/image 67) using similar measurement technique. No new pathologically enlarged lymph nodes in the abdomen or pelvis. Reproductive: Normal size prostate. Other: No pneumoperitoneum, ascites or focal fluid collection. Musculoskeletal: No aggressive appearing focal osseous lesions. Moderate lumbar spondylosis. IMPRESSION: 1. No new or progressive metastatic disease in the  chest, abdomen or pelvis. 2. Stable primary pulmonary nodule in the right upper lobe. Stable subcentimeter bilateral upper lobe pulmonary nodules. 3. Stable mild right hilar, subcarinal, left axillary and upper retroperitoneal adenopathy. 4. Subtle morphologic changes suggestive of hepatic cirrhosis, unchanged. 5.  Aortic Atherosclerosis (ICD10-I70.0). 6. Stable 3.1 cm infrarenal Abdominal Aortic Aneurysm (ICD10-I71.9). Recommend follow-up aortic ultrasound in 3 years. This recommendation follows ACR consensus guidelines: White Paper of the ACR Incidental Findings Committee II on Vascular Findings. J Am Coll Radiol 2013; 10:789-794. Electronically Signed   By: Ilona Sorrel M.D.   On: 03/31/2018 23:21   Ct Abdomen Pelvis W Contrast  Result Date: 03/31/2018 CLINICAL DATA:  Stage IV right upper lobe squamous cell lung carcinoma diagnosed November 2018 with ongoing chemotherapy and immunotherapy. Restaging. EXAM: CT CHEST, ABDOMEN, AND PELVIS WITH CONTRAST TECHNIQUE: Multidetector CT imaging of the chest, abdomen and pelvis was performed following the standard protocol during bolus administration of intravenous contrast. CONTRAST:  128mL OMNIPAQUE IOHEXOL 300 MG/ML  SOLN COMPARISON:  01/24/2018 CT chest, abdomen and pelvis. FINDINGS: CT CHEST FINDINGS Cardiovascular: Normal heart size. No significant pericardial effusion/thickening. Left anterior descending and right coronary atherosclerosis. Atherosclerotic nonaneurysmal thoracic aorta. Normal caliber pulmonary arteries. No central pulmonary emboli. Mediastinum/Nodes: Stable subcentimeter hypodense posterior upper right thyroid lobe nodule. Unremarkable esophagus. No right axillary adenopathy. Stable enlarged 1.7 cm left axillary node (series 2/image 17). Mildly  enlarged 1.3 cm subcarinal node (series 2/image 31), previously 1.3 cm using similar measurement technique, stable. No additional pathologically enlarged mediastinal nodes. Stable mildly enlarged 1.1 cm  right hilar node (series 2/image 30). No left hilar adenopathy. Lungs/Pleura: No pneumothorax. No pleural effusion. No acute consolidative airspace disease or lung masses. Spiculated peripheral right upper lobe 1.7 cm solid pulmonary nodule (series 4/image 40), previously 1.7 cm using similar measurement technique, stable. A few scattered subcentimeter pulmonary nodules in the upper lobes bilaterally, largest 7 mm in the medial apical left upper lobe (series 4/image 35), not appreciably changed in the interval. No new significant pulmonary nodules. Musculoskeletal: No aggressive appearing focal osseous lesions. Mild thoracic spondylosis. CT ABDOMEN PELVIS FINDINGS Hepatobiliary: Stable relative hypertrophy of the left liver lobe with questionable fine liver surface irregularity, can not exclude liver cirrhosis. No liver masses. Normal gallbladder with no radiopaque cholelithiasis. No biliary ductal dilatation. Pancreas: Normal, with no mass or duct dilation. Spleen: Normal size. No mass. Adrenals/Urinary Tract: Stable 3.1 cm right adrenal nodule, characterized as an adenoma 02/15/2017 PET-CT. No left adrenal nodules. No hydronephrosis. Simple 2.1 cm upper right renal cyst. Stable subcentimeter hypodense interpolar left renal cortical lesion, too small to characterize, considered benign. No new renal lesions. Normal nondistended bladder. Stomach/Bowel: Normal non-distended stomach. Normal caliber small bowel with no small bowel wall thickening. FX. Marked sigmoid diverticulosis, no large bowel wall thickening or acute pericolonic fat stranding. Oral contrast transits to the rectum. Vascular/Lymphatic: Atherosclerotic abdominal aorta with stable 3.1 cm infrarenal abdominal aortic aneurysm using similar measurement technique. Patent portal, splenic, hepatic and renal veins. Enlarged 2.0 cm portacaval node (series 2/image 72), stable using similar measurement technique. Stable enlarged 1.9 cm peripancreatic node (series  2/image 67) using similar measurement technique. No new pathologically enlarged lymph nodes in the abdomen or pelvis. Reproductive: Normal size prostate. Other: No pneumoperitoneum, ascites or focal fluid collection. Musculoskeletal: No aggressive appearing focal osseous lesions. Moderate lumbar spondylosis. IMPRESSION: 1. No new or progressive metastatic disease in the chest, abdomen or pelvis. 2. Stable primary pulmonary nodule in the right upper lobe. Stable subcentimeter bilateral upper lobe pulmonary nodules. 3. Stable mild right hilar, subcarinal, left axillary and upper retroperitoneal adenopathy. 4. Subtle morphologic changes suggestive of hepatic cirrhosis, unchanged. 5.  Aortic Atherosclerosis (ICD10-I70.0). 6. Stable 3.1 cm infrarenal Abdominal Aortic Aneurysm (ICD10-I71.9). Recommend follow-up aortic ultrasound in 3 years. This recommendation follows ACR consensus guidelines: White Paper of the ACR Incidental Findings Committee II on Vascular Findings. J Am Coll Radiol 2013; 10:789-794. Electronically Signed   By: Ilona Sorrel M.D.   On: 03/31/2018 23:21    ASSESSMENT AND PLAN: This is a very pleasant 67 years old white male with a stage IV non-small cell lung cancer, squamous cell carcinoma.  The patient is currently undergoing systemic chemotherapy with carboplatin, paclitaxel and Keytruda status post 4 cycles with partial response. He is currently undergoing maintenance treatment with single agent Keytruda status post 18 cycles. The patient has been tolerating his treatment with immunotherapy fairly well. He had repeat CT scan of the chest, abdomen and pelvis performed recently.  I personally and independently reviewed the scan and discussed the results with the patient and his wife.  His a scan showed no concerning findings for disease progression. I recommended for the patient to continue his current treatment with [facility] and he will proceed with cycle #19 today. I will see the patient  back for follow-up visit in 3 weeks for evaluation before starting cycle #20. He was advised  to call immediately if he has any concerning symptoms in the interval. The patient voices understanding of current disease status and treatment options and is in agreement with the current care plan. All questions were answered. The patient knows to call the clinic with any problems, questions or concerns. We can certainly see the patient much sooner if necessary.  Disclaimer: This note was dictated with voice recognition software. Similar sounding words can inadvertently be transcribed and may not be corrected upon review.

## 2018-04-01 NOTE — Telephone Encounter (Signed)
Scheduled appt per 12/31 los - gave patient AVS and calender per los.

## 2018-04-04 ENCOUNTER — Telehealth: Payer: Self-pay | Admitting: Medical Oncology

## 2018-04-04 ENCOUNTER — Ambulatory Visit (HOSPITAL_COMMUNITY)
Admission: RE | Admit: 2018-04-04 | Discharge: 2018-04-04 | Disposition: A | Discharge status: Home / Self Care | Payer: Medicare Other | Source: Ambulatory Visit | Attending: Internal Medicine | Admitting: Internal Medicine

## 2018-04-04 DIAGNOSIS — R600 Localized edema: Secondary | ICD-10-CM | POA: Diagnosis present

## 2018-04-04 NOTE — Telephone Encounter (Signed)
Result-LE ultrasound negative for DVT -pt was sent home.

## 2018-04-04 NOTE — Progress Notes (Signed)
Bilateral lower extremity venous duplex completed - preliminary results - There is no evidence of DVT or Baker's cyst. Report left on voicemail for dr. Julien Nordmann. [facility], [place] 04/04/2018 1:58 PM

## 2018-04-22 ENCOUNTER — Inpatient Hospital Stay: Payer: Medicare Other

## 2018-04-22 ENCOUNTER — Inpatient Hospital Stay: Payer: Medicare Other | Attending: Oncology

## 2018-04-22 ENCOUNTER — Inpatient Hospital Stay (HOSPITAL_BASED_OUTPATIENT_CLINIC_OR_DEPARTMENT_OTHER): Payer: Medicare Other | Admitting: Internal Medicine

## 2018-04-22 ENCOUNTER — Other Ambulatory Visit: Payer: Self-pay | Admitting: Medical Oncology

## 2018-04-22 ENCOUNTER — Telehealth: Payer: Self-pay | Admitting: Internal Medicine

## 2018-04-22 ENCOUNTER — Encounter: Payer: Self-pay | Admitting: Internal Medicine

## 2018-04-22 VITALS — BP 130/74 | HR 60 | Temp 97.6°F | Resp 20 | Ht >= 80 in | Wt 293.0 lb

## 2018-04-22 DIAGNOSIS — Z5112 Encounter for antineoplastic immunotherapy: Secondary | ICD-10-CM

## 2018-04-22 DIAGNOSIS — C7951 Secondary malignant neoplasm of bone: Secondary | ICD-10-CM | POA: Insufficient documentation

## 2018-04-22 DIAGNOSIS — C3491 Malignant neoplasm of unspecified part of right bronchus or lung: Secondary | ICD-10-CM

## 2018-04-22 DIAGNOSIS — Z923 Personal history of irradiation: Secondary | ICD-10-CM

## 2018-04-22 DIAGNOSIS — Z9221 Personal history of antineoplastic chemotherapy: Secondary | ICD-10-CM | POA: Diagnosis not present

## 2018-04-22 DIAGNOSIS — Z79899 Other long term (current) drug therapy: Secondary | ICD-10-CM

## 2018-04-22 DIAGNOSIS — I2510 Atherosclerotic heart disease of native coronary artery without angina pectoris: Secondary | ICD-10-CM | POA: Diagnosis not present

## 2018-04-22 DIAGNOSIS — I129 Hypertensive chronic kidney disease with stage 1 through stage 4 chronic kidney disease, or unspecified chronic kidney disease: Secondary | ICD-10-CM | POA: Insufficient documentation

## 2018-04-22 DIAGNOSIS — M1990 Unspecified osteoarthritis, unspecified site: Secondary | ICD-10-CM | POA: Diagnosis not present

## 2018-04-22 DIAGNOSIS — E7800 Pure hypercholesterolemia, unspecified: Secondary | ICD-10-CM | POA: Diagnosis not present

## 2018-04-22 DIAGNOSIS — C3411 Malignant neoplasm of upper lobe, right bronchus or lung: Secondary | ICD-10-CM | POA: Insufficient documentation

## 2018-04-22 DIAGNOSIS — I4892 Unspecified atrial flutter: Secondary | ICD-10-CM

## 2018-04-22 DIAGNOSIS — G6289 Other specified polyneuropathies: Secondary | ICD-10-CM

## 2018-04-22 DIAGNOSIS — G629 Polyneuropathy, unspecified: Secondary | ICD-10-CM | POA: Diagnosis not present

## 2018-04-22 DIAGNOSIS — N189 Chronic kidney disease, unspecified: Secondary | ICD-10-CM | POA: Insufficient documentation

## 2018-04-22 DIAGNOSIS — J449 Chronic obstructive pulmonary disease, unspecified: Secondary | ICD-10-CM | POA: Diagnosis not present

## 2018-04-22 LAB — CMP (CANCER CENTER ONLY)
ALT: 42 U/L (ref 0–44)
AST: 36 U/L (ref 15–41)
Albumin: 3.8 g/dL (ref 3.5–5.0)
Alkaline Phosphatase: 92 U/L (ref 38–126)
Anion gap: 9 (ref 5–15)
BILIRUBIN TOTAL: 1 mg/dL (ref 0.3–1.2)
BUN: 18 mg/dL (ref 8–23)
CALCIUM: 9.1 mg/dL (ref 8.9–10.3)
CO2: 25 mmol/L (ref 22–32)
Chloride: 104 mmol/L (ref 98–111)
Creatinine: 0.93 mg/dL (ref 0.61–1.24)
GFR, Est AFR Am: >60 mL/min (ref >60)
Glucose, Bld: 152 mg/dL — ABNORMAL HIGH (ref 70–99)
Potassium: 3.8 mmol/L (ref 3.5–5.1)
Sodium: 138 mmol/L (ref 135–145)
Total Protein: 7.1 g/dL (ref 6.5–8.1)

## 2018-04-22 LAB — CBC WITH DIFFERENTIAL (CANCER CENTER ONLY)
Abs Immature Granulocytes: 0 10*3/uL (ref 0.00–0.07)
Basophils Absolute: 0 10*3/uL (ref 0.0–0.1)
Basophils Relative: 1 %
Eosinophils Absolute: 0.2 10*3/uL (ref 0.0–0.5)
Eosinophils Relative: 4 %
HCT: 43.4 % (ref 39.0–52.0)
Hemoglobin: 14.3 g/dL (ref 13.0–17.0)
Immature Granulocytes: 0 %
Lymphocytes Relative: 32 %
Lymphs Abs: 1.5 10*3/uL (ref 0.7–4.0)
MCH: 27.5 pg (ref 26.0–34.0)
MCHC: 32.9 g/dL (ref 30.0–36.0)
MCV: 83.5 fL (ref 80.0–100.0)
Monocytes Absolute: 0.5 10*3/uL (ref 0.1–1.0)
Monocytes Relative: 12 %
Neutro Abs: 2.4 10*3/uL (ref 1.7–7.7)
Neutrophils Relative %: 51 %
Platelet Count: 161 10*3/uL (ref 150–400)
RBC: 5.2 MIL/uL (ref 4.22–5.81)
RDW: 14.1 % (ref 11.5–15.5)
WBC Count: 4.6 10*3/uL (ref 4.0–10.5)
nRBC: 0 % (ref 0.0–0.2)

## 2018-04-22 LAB — TSH: TSH: 2.645 u[IU]/mL (ref 0.320–4.118)

## 2018-04-22 MED ORDER — SODIUM CHLORIDE 0.9 % IV SOLN
200.0000 mg | Freq: Once | INTRAVENOUS | Status: AC
  Administered 2018-04-22: 200 mg via INTRAVENOUS
  Filled 2018-04-22: qty 8

## 2018-04-22 MED ORDER — GABAPENTIN 300 MG PO CAPS: 300.0000 mg | ORAL_CAPSULE | Freq: Three times a day (TID) | ORAL | 0 refills | Status: DC

## 2018-04-22 MED ORDER — SODIUM CHLORIDE 0.9 % IV SOLN
Freq: Once | INTRAVENOUS | Status: AC
  Administered 2018-04-22: 12:00:00 via INTRAVENOUS
  Filled 2018-04-22: qty 250

## 2018-04-22 NOTE — Progress Notes (Signed)
[place] [phone]   [phone]  OFFICE PROGRESS NOTE  Sheryle Hail, PA-C [address] [place] [address]  DIAGNOSIS: Stage IV (T2a,N3,M1c)non-small cell lung cancer, squamous cell carcinoma presented with right upper lobe lung mass in addition to right hilar and mediastinal adenopathy as well as metastatic disease in the medial right thigh diagnosed in November 2018.  PRIOR THERAPY:  1) Palliative radiotherapy to the right lower extremity mass completed April 09, 2017. 2) Systemic chemotherapy with carboplatin for AUC of 5, paclitaxel 175 mg/M2 and Keytruda 200 mg IV every 3 weeks.  First dose March 12, 2017, status post 4 cycles with partial response.  CURRENT THERAPY: Maintenance treatment with single agent Keytruda 200 mg IV every 3 weeks.  First cycle June 11, 2017.  Status post 19 cycles.  INTERVAL HISTORY: Aaron Sellers 68 y.o. male returns to the clinic today for follow-up visit accompanied by his wife.  The patient has no complaints today.  He denied having any chest pain, shortness of breath except with exertion with no cough or hemoptysis.  He denied having any fever or chills.  He has no headache or visual changes.  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  The patient is here today for evaluation before starting cycle #20 of his treatment.   MEDICAL HISTORY: Past Medical History:  Diagnosis Date  . Arthritis   . Atrial flutter ([facility])   . BPH (benign prostatic hypertrophy) with urinary retention   . Cancer ([facility])   . Chronic kidney disease    hx of kidney stones  2011  . COPD (chronic obstructive pulmonary disease) ([facility])    has been smoking since he was 22 ish--  . High cholesterol   . Hypertension    dx around 2011  . Squamous cell carcinoma of right lung ([facility])     ALLERGIES:  is allergic to  ciprofloxacin; adhesive [tape]; and codeine.  MEDICATIONS:  Current Outpatient Medications  Medication Sig Dispense Refill  . acetaminophen (TYLENOL) 500 MG tablet Take 1,000 mg by mouth every 6 (six) hours as needed for moderate pain.    Marland Kitchen amoxicillin-clavulanate (AUGMENTIN) 875-125 MG tablet Take 1 tablet by mouth 2 (two) times daily. (Patient not taking: Reported on 04/01/2018) 14 tablet 0  . atorvastatin (LIPITOR) 10 MG tablet Take 10 mg by mouth every evening.     . Cyanocobalamin (VITAMIN B-12 PO) Take 1 tablet by mouth daily.    Marland Kitchen diltiazem (CARDIZEM CD) 180 MG 24 hr capsule Take 1 capsule (180 mg total) by mouth daily. 90 capsule 1  . famotidine (PEPCID) 10 MG tablet Take 10 mg by mouth as needed for heartburn or indigestion.    . furosemide (LASIX) 40 MG tablet Take 1 tablet (40 mg total) by mouth daily as needed. 90 tablet 3  . gabapentin (NEURONTIN) 300 MG capsule TAKE 1 CAPSULE BY MOUTH THREE TIMES DAILY 90 capsule 0  . loratadine (CLARITIN) 10 MG tablet Take 10 mg by mouth daily.    . tamsulosin (FLOMAX) 0.4 MG CAPS capsule Take 0.4 mg by mouth daily.     No current facility-administered medications for this visit.    Facility-Administered Medications Ordered in Other Visits  Medication Dose Route Frequency Provider Last Rate Last Dose  . heparin lock flush 100 unit/mL  500 Units Intracatheter Once PRN Curt Bears, MD      .  sodium chloride flush (NS) 0.9 % injection 10 mL  10 mL Intracatheter PRN Curt Bears, MD        SURGICAL HISTORY:  Past Surgical History:  Procedure Laterality Date  . HERNIA REPAIR     umbilical hernia  07/5407  . INCISION AND DRAINAGE ABSCESS     "down the middle" of his buttocks  2015  . TOTAL KNEE ARTHROPLASTY Left 10/29/2014   Procedure: TOTAL KNEE ARTHROPLASTY;  Surgeon: Dorna Leitz, MD;  Location: [facility];  Service: Orthopedics;  Laterality: Left;    REVIEW OF SYSTEMS:  A comprehensive review of systems was negative except for:  Respiratory: positive for dyspnea on exertion Neurological: positive for paresthesia   PHYSICAL EXAMINATION: General appearance: alert, cooperative and no distress Head: Normocephalic, without obvious abnormality, atraumatic Neck: no adenopathy, no JVD, supple, symmetrical, trachea midline and thyroid not enlarged, symmetric, no tenderness/mass/nodules Lymph nodes: Cervical, supraclavicular, and axillary nodes normal. Resp: clear to auscultation bilaterally Back: symmetric, no curvature. ROM normal. No CVA tenderness. Cardio: regular rate and rhythm, S1, S2 normal, no murmur, click, rub or gallop GI: soft, non-tender; bowel sounds normal; no masses,  no organomegaly Extremities: extremities normal, atraumatic, no cyanosis or edema  ECOG PERFORMANCE STATUS: 1 - Symptomatic but completely ambulatory  Blood pressure 130/74, pulse 60, temperature 97.6 F (36.4 C), temperature source Oral, resp. rate 20, height 6\' 8"  (2.032 m), weight 293 lb (132.9 kg), SpO2 96 %.  LABORATORY DATA: Lab Results  Component Value Date   WBC 4.0 04/01/2018   HGB 13.8 04/01/2018   HCT 42.2 04/01/2018   MCV 84.9 04/01/2018   PLT 149 (L) 04/01/2018      Chemistry      Component Value Date/Time   NA 140 04/01/2018 0925   NA 137 04/03/2017 1030   K 3.6 04/01/2018 0925   K 4.1 04/03/2017 1030   CL 107 04/01/2018 0925   CO2 24 04/01/2018 0925   CO2 27 04/03/2017 1030   BUN 14 04/01/2018 0925   BUN 15.5 04/03/2017 1030   CREATININE 0.95 04/01/2018 0925   CREATININE 0.8 04/03/2017 1030      Component Value Date/Time   CALCIUM 9.0 04/01/2018 0925   CALCIUM 9.4 04/03/2017 1030   ALKPHOS 97 04/01/2018 0925   ALKPHOS 97 04/03/2017 1030   AST 36 04/01/2018 0925   AST 14 04/03/2017 1030   ALT 39 04/01/2018 0925   ALT 21 04/03/2017 1030   BILITOT 1.0 04/01/2018 0925   BILITOT 0.67 04/03/2017 1030       RADIOGRAPHIC STUDIES: Ct Chest W Contrast  Result Date: 03/31/2018 CLINICAL DATA:  Stage IV  right upper lobe squamous cell lung carcinoma diagnosed November 2018 with ongoing chemotherapy and immunotherapy. Restaging. EXAM: CT CHEST, ABDOMEN, AND PELVIS WITH CONTRAST TECHNIQUE: Multidetector CT imaging of the chest, abdomen and pelvis was performed following the standard protocol during bolus administration of intravenous contrast. CONTRAST:  16mL OMNIPAQUE IOHEXOL 300 MG/ML  SOLN COMPARISON:  01/24/2018 CT chest, abdomen and pelvis. FINDINGS: CT CHEST FINDINGS Cardiovascular: Normal heart size. No significant pericardial effusion/thickening. Left anterior descending and right coronary atherosclerosis. Atherosclerotic nonaneurysmal thoracic aorta. Normal caliber pulmonary arteries. No central pulmonary emboli. Mediastinum/Nodes: Stable subcentimeter hypodense posterior upper right thyroid lobe nodule. Unremarkable esophagus. No right axillary adenopathy. Stable enlarged 1.7 cm left axillary node (series 2/image 17). Mildly enlarged 1.3 cm subcarinal node (series 2/image 31), previously 1.3 cm using similar measurement technique, stable. No additional pathologically enlarged mediastinal nodes. Stable mildly enlarged 1.1 cm  right hilar node (series 2/image 30). No left hilar adenopathy. Lungs/Pleura: No pneumothorax. No pleural effusion. No acute consolidative airspace disease or lung masses. Spiculated peripheral right upper lobe 1.7 cm solid pulmonary nodule (series 4/image 40), previously 1.7 cm using similar measurement technique, stable. A few scattered subcentimeter pulmonary nodules in the upper lobes bilaterally, largest 7 mm in the medial apical left upper lobe (series 4/image 35), not appreciably changed in the interval. No new significant pulmonary nodules. Musculoskeletal: No aggressive appearing focal osseous lesions. Mild thoracic spondylosis. CT ABDOMEN PELVIS FINDINGS Hepatobiliary: Stable relative hypertrophy of the left liver lobe with questionable fine liver surface irregularity, can not  exclude liver cirrhosis. No liver masses. Normal gallbladder with no radiopaque cholelithiasis. No biliary ductal dilatation. Pancreas: Normal, with no mass or duct dilation. Spleen: Normal size. No mass. Adrenals/Urinary Tract: Stable 3.1 cm right adrenal nodule, characterized as an adenoma 02/15/2017 PET-CT. No left adrenal nodules. No hydronephrosis. Simple 2.1 cm upper right renal cyst. Stable subcentimeter hypodense interpolar left renal cortical lesion, too small to characterize, considered benign. No new renal lesions. Normal nondistended bladder. Stomach/Bowel: Normal non-distended stomach. Normal caliber small bowel with no small bowel wall thickening. FX. Marked sigmoid diverticulosis, no large bowel wall thickening or acute pericolonic fat stranding. Oral contrast transits to the rectum. Vascular/Lymphatic: Atherosclerotic abdominal aorta with stable 3.1 cm infrarenal abdominal aortic aneurysm using similar measurement technique. Patent portal, splenic, hepatic and renal veins. Enlarged 2.0 cm portacaval node (series 2/image 72), stable using similar measurement technique. Stable enlarged 1.9 cm peripancreatic node (series 2/image 67) using similar measurement technique. No new pathologically enlarged lymph nodes in the abdomen or pelvis. Reproductive: Normal size prostate. Other: No pneumoperitoneum, ascites or focal fluid collection. Musculoskeletal: No aggressive appearing focal osseous lesions. Moderate lumbar spondylosis. IMPRESSION: 1. No new or progressive metastatic disease in the chest, abdomen or pelvis. 2. Stable primary pulmonary nodule in the right upper lobe. Stable subcentimeter bilateral upper lobe pulmonary nodules. 3. Stable mild right hilar, subcarinal, left axillary and upper retroperitoneal adenopathy. 4. Subtle morphologic changes suggestive of hepatic cirrhosis, unchanged. 5.  Aortic Atherosclerosis (ICD10-I70.0). 6. Stable 3.1 cm infrarenal Abdominal Aortic Aneurysm (ICD10-I71.9).  Recommend follow-up aortic ultrasound in 3 years. This recommendation follows ACR consensus guidelines: White Paper of the ACR Incidental Findings Committee II on Vascular Findings. J Am Coll Radiol 2013; 10:789-794. Electronically Signed   By: Ilona Sorrel M.D.   On: 03/31/2018 23:21   Ct Abdomen Pelvis W Contrast  Result Date: 03/31/2018 CLINICAL DATA:  Stage IV right upper lobe squamous cell lung carcinoma diagnosed November 2018 with ongoing chemotherapy and immunotherapy. Restaging. EXAM: CT CHEST, ABDOMEN, AND PELVIS WITH CONTRAST TECHNIQUE: Multidetector CT imaging of the chest, abdomen and pelvis was performed following the standard protocol during bolus administration of intravenous contrast. CONTRAST:  173mL OMNIPAQUE IOHEXOL 300 MG/ML  SOLN COMPARISON:  01/24/2018 CT chest, abdomen and pelvis. FINDINGS: CT CHEST FINDINGS Cardiovascular: Normal heart size. No significant pericardial effusion/thickening. Left anterior descending and right coronary atherosclerosis. Atherosclerotic nonaneurysmal thoracic aorta. Normal caliber pulmonary arteries. No central pulmonary emboli. Mediastinum/Nodes: Stable subcentimeter hypodense posterior upper right thyroid lobe nodule. Unremarkable esophagus. No right axillary adenopathy. Stable enlarged 1.7 cm left axillary node (series 2/image 17). Mildly enlarged 1.3 cm subcarinal node (series 2/image 31), previously 1.3 cm using similar measurement technique, stable. No additional pathologically enlarged mediastinal nodes. Stable mildly enlarged 1.1 cm right hilar node (series 2/image 30). No left hilar adenopathy. Lungs/Pleura: No pneumothorax. No pleural effusion. No acute consolidative  airspace disease or lung masses. Spiculated peripheral right upper lobe 1.7 cm solid pulmonary nodule (series 4/image 40), previously 1.7 cm using similar measurement technique, stable. A few scattered subcentimeter pulmonary nodules in the upper lobes bilaterally, largest 7 mm in the  medial apical left upper lobe (series 4/image 35), not appreciably changed in the interval. No new significant pulmonary nodules. Musculoskeletal: No aggressive appearing focal osseous lesions. Mild thoracic spondylosis. CT ABDOMEN PELVIS FINDINGS Hepatobiliary: Stable relative hypertrophy of the left liver lobe with questionable fine liver surface irregularity, can not exclude liver cirrhosis. No liver masses. Normal gallbladder with no radiopaque cholelithiasis. No biliary ductal dilatation. Pancreas: Normal, with no mass or duct dilation. Spleen: Normal size. No mass. Adrenals/Urinary Tract: Stable 3.1 cm right adrenal nodule, characterized as an adenoma 02/15/2017 PET-CT. No left adrenal nodules. No hydronephrosis. Simple 2.1 cm upper right renal cyst. Stable subcentimeter hypodense interpolar left renal cortical lesion, too small to characterize, considered benign. No new renal lesions. Normal nondistended bladder. Stomach/Bowel: Normal non-distended stomach. Normal caliber small bowel with no small bowel wall thickening. FX. Marked sigmoid diverticulosis, no large bowel wall thickening or acute pericolonic fat stranding. Oral contrast transits to the rectum. Vascular/Lymphatic: Atherosclerotic abdominal aorta with stable 3.1 cm infrarenal abdominal aortic aneurysm using similar measurement technique. Patent portal, splenic, hepatic and renal veins. Enlarged 2.0 cm portacaval node (series 2/image 72), stable using similar measurement technique. Stable enlarged 1.9 cm peripancreatic node (series 2/image 67) using similar measurement technique. No new pathologically enlarged lymph nodes in the abdomen or pelvis. Reproductive: Normal size prostate. Other: No pneumoperitoneum, ascites or focal fluid collection. Musculoskeletal: No aggressive appearing focal osseous lesions. Moderate lumbar spondylosis. IMPRESSION: 1. No new or progressive metastatic disease in the chest, abdomen or pelvis. 2. Stable primary  pulmonary nodule in the right upper lobe. Stable subcentimeter bilateral upper lobe pulmonary nodules. 3. Stable mild right hilar, subcarinal, left axillary and upper retroperitoneal adenopathy. 4. Subtle morphologic changes suggestive of hepatic cirrhosis, unchanged. 5.  Aortic Atherosclerosis (ICD10-I70.0). 6. Stable 3.1 cm infrarenal Abdominal Aortic Aneurysm (ICD10-I71.9). Recommend follow-up aortic ultrasound in 3 years. This recommendation follows ACR consensus guidelines: White Paper of the ACR Incidental Findings Committee II on Vascular Findings. J Am Coll Radiol 2013; 10:789-794. Electronically Signed   By: Ilona Sorrel M.D.   On: 03/31/2018 23:21   Vas [place] Lower Extremity Venous (dvt)  Result Date: 04/05/2018  Lower Venous Study Indications: Swelling. Right lower extremity  Risk Factors: Cancer Lung. Performing Technologist: Toma Copier RVS  Examination Guidelines: A complete evaluation includes B-mode imaging, spectral Doppler, color Doppler, and power Doppler as needed of all accessible portions of each vessel. Bilateral testing is considered an integral part of a complete examination. Limited examinations for reoccurring indications may be performed as noted.  Right Venous Findings: +---------+---------------+---------+-----------+----------+------------------+          CompressibilityPhasicitySpontaneityPropertiesSummary            +---------+---------------+---------+-----------+----------+------------------+ CFV      Full           Yes      Yes                                     +---------+---------------+---------+-----------+----------+------------------+ SFJ      Full                                                            +---------+---------------+---------+-----------+----------+------------------+  FV Prox  Full           Yes      Yes                                     +---------+---------------+---------+-----------+----------+------------------+ FV  Mid   Full                                                            +---------+---------------+---------+-----------+----------+------------------+ FV DistalFull           Yes      Yes                                     +---------+---------------+---------+-----------+----------+------------------+ PFV      Full           Yes      Yes                                     +---------+---------------+---------+-----------+----------+------------------+ POP      Full           Yes      Yes                                     +---------+---------------+---------+-----------+----------+------------------+ PTV      Full                                                            +---------+---------------+---------+-----------+----------+------------------+ PERO     Full                                         difficult to image +---------+---------------+---------+-----------+----------+------------------+  Left Venous Findings: +---------+---------------+---------+-----------+----------+------------------+          CompressibilityPhasicitySpontaneityPropertiesSummary            +---------+---------------+---------+-----------+----------+------------------+ CFV      Full           Yes      Yes                                     +---------+---------------+---------+-----------+----------+------------------+ SFJ      Full                                                            +---------+---------------+---------+-----------+----------+------------------+ FV Prox  Full           Yes      Yes                                     +---------+---------------+---------+-----------+----------+------------------+  FV Mid   Full                                                            +---------+---------------+---------+-----------+----------+------------------+ FV DistalFull           Yes      Yes                                      +---------+---------------+---------+-----------+----------+------------------+ PFV      Full           Yes      Yes                                     +---------+---------------+---------+-----------+----------+------------------+ POP      Full           Yes      Yes                                     +---------+---------------+---------+-----------+----------+------------------+ PTV      Full                                                            +---------+---------------+---------+-----------+----------+------------------+ PERO     Full                                         Difficult to image +---------+---------------+---------+-----------+----------+------------------+    Summary: Right: There is no evidence of deep vein thrombosis in the lower extremity. No cystic structure found in the popliteal fossa. Left: There is no evidence of deep vein thrombosis in the lower extremity. No cystic structure found in the popliteal fossa.  *See table(s) above for measurements and observations. Electronically signed by Ruta Hinds MD on 04/05/2018 at 3:22:06 PM.    Final     ASSESSMENT AND PLAN: This is a very pleasant 68 years old white male with a stage IV non-small cell lung cancer, squamous cell carcinoma.  The patient is currently undergoing systemic chemotherapy with carboplatin, paclitaxel and Keytruda status post 4 cycles with partial response. He is currently undergoing maintenance treatment with single agent Keytruda status post 19 cycles. The patient has been tolerating this treatment well with no concerning adverse effects. I recommended for him to proceed with cycle #20 today as scheduled. I will see him back for follow-up visit in 3 weeks for evaluation before starting cycle #21. For the peripheral neuropathy, I gave the patient a refill for gabapentin. He was advised to call immediately if he has any concerning symptoms in the interval. The patient voices  understanding of current disease status and treatment options and is in agreement with the current care plan. All questions were answered. The patient knows to call the clinic with any problems, questions or concerns. We  can certainly see the patient much sooner if necessary.  Disclaimer: This note was dictated with voice recognition software. Similar sounding words can inadvertently be transcribed and may not be corrected upon review.

## 2018-04-22 NOTE — Telephone Encounter (Signed)
Scheduled appt per 1/21 los - pt to get an updated schedule next visit.

## 2018-04-22 NOTE — Patient Instructions (Signed)
[facility] Discharge Instructions for Patients Receiving Chemotherapy  Today you received the following chemotherapy agent: Keytruda  To help prevent nausea and vomiting after your treatment, we encourage you to take your nausea medication as directed.   If you develop nausea and vomiting that is not controlled by your nausea medication, call the clinic.   BELOW ARE SYMPTOMS THAT SHOULD BE REPORTED IMMEDIATELY:  *FEVER GREATER THAN 100.5 F  *CHILLS WITH OR WITHOUT FEVER  NAUSEA AND VOMITING THAT IS NOT CONTROLLED WITH YOUR NAUSEA MEDICATION  *UNUSUAL SHORTNESS OF BREATH  *UNUSUAL BRUISING OR BLEEDING  TENDERNESS IN MOUTH AND THROAT WITH OR WITHOUT PRESENCE OF ULCERS  *URINARY PROBLEMS  *BOWEL PROBLEMS  UNUSUAL RASH Items with * indicate a potential emergency and should be followed up as soon as possible.  Feel free to call the clinic should you have any questions or concerns. The clinic phone number is [phone].  Please show the [facility] at check-in to the Emergency Department and triage nurse.

## 2018-05-06 IMAGING — US US BIOPSY LYMPH NODE
1 series · 11 of 11 positions shown · non-contrast
Comparison: none

INDICATION: 66-year-old with right lung lesion and concern for metastatic lung
cancer. FDG positive lymph node in the right upper neck.

[Series 1: us biopsy lymph node · 0.06mm/px · 11 acquisitions, 11 frames shown]
[im 1/11]
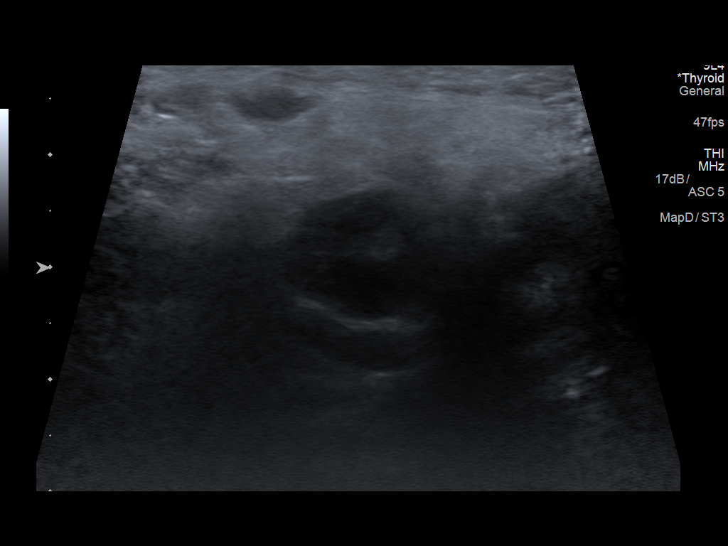
[im 2/11]
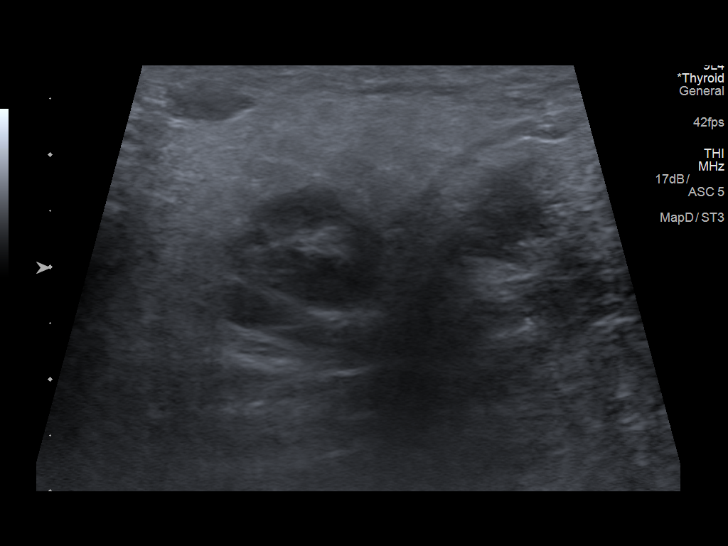
[im 3/11]
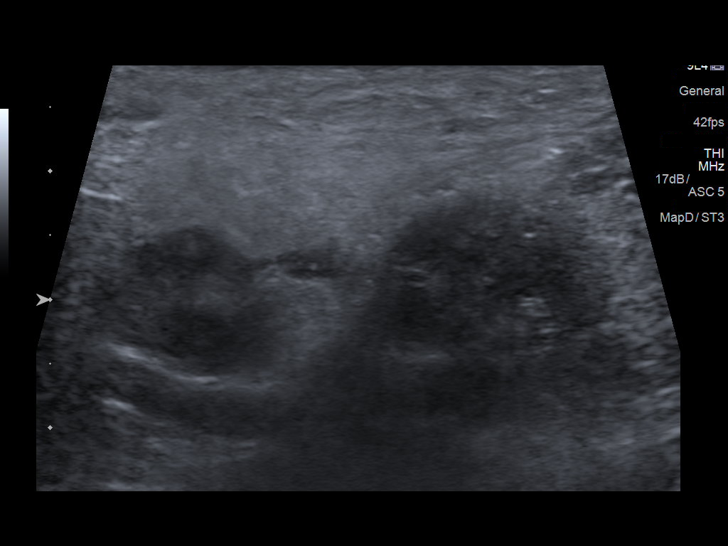
[im 4/11]
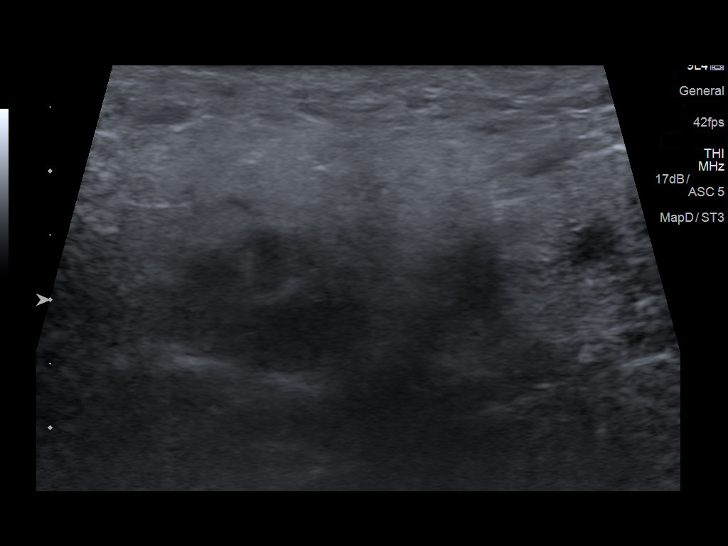
[im 5/11]
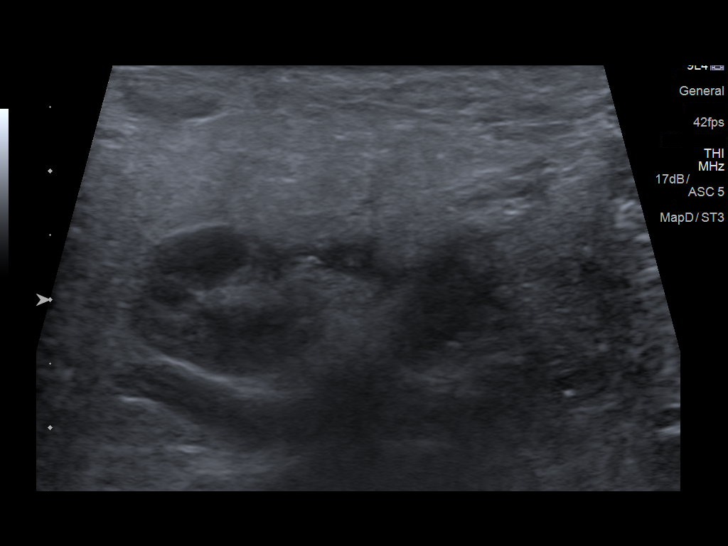
[im 6/11]
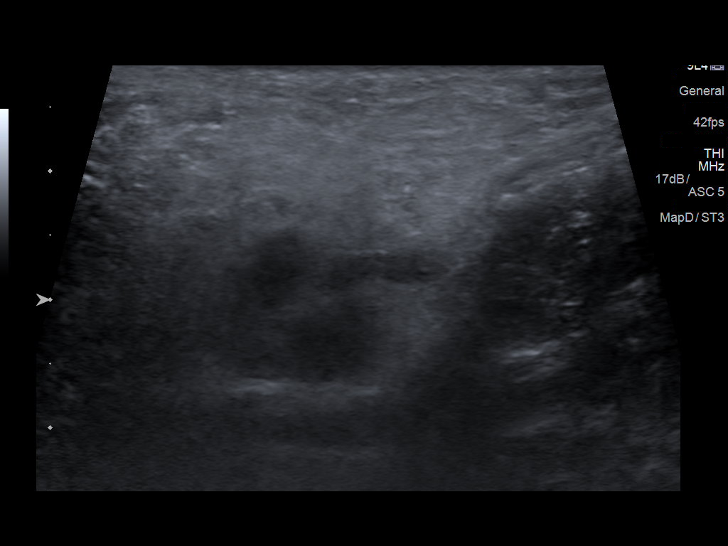
[im 7/11]
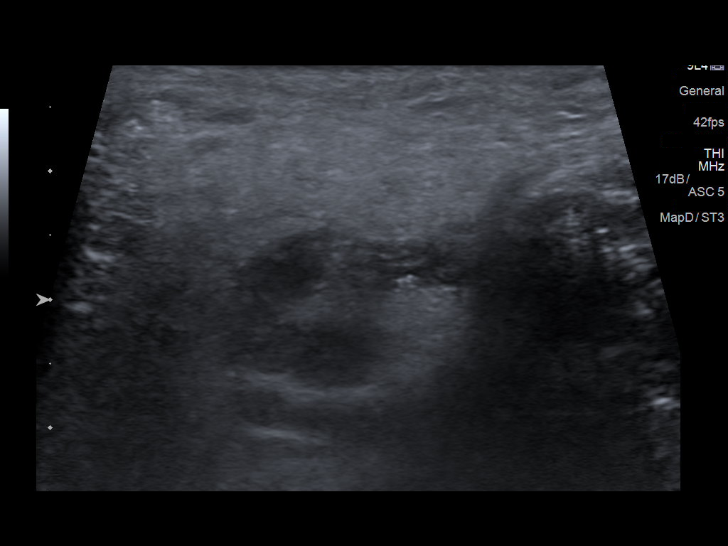
[im 8/11]
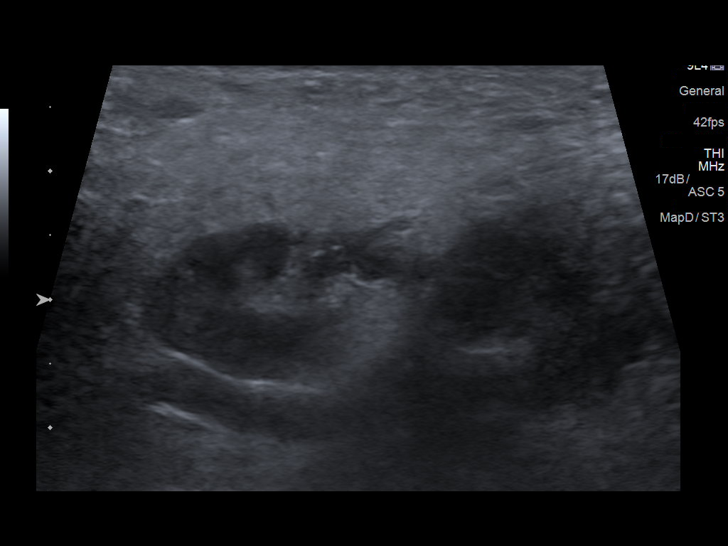
[im 9/11]
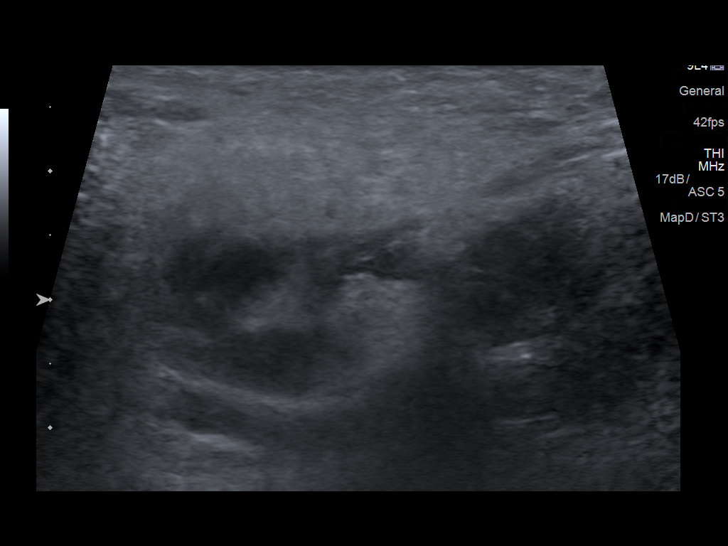
[im 10/11]
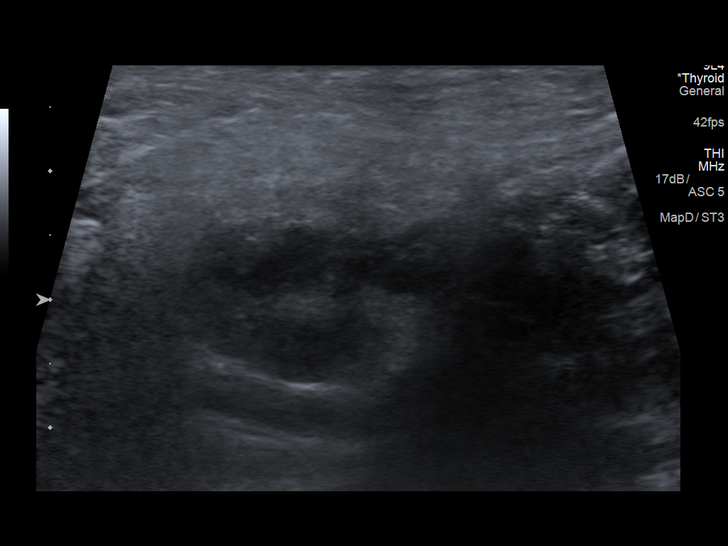
[im 11/11]
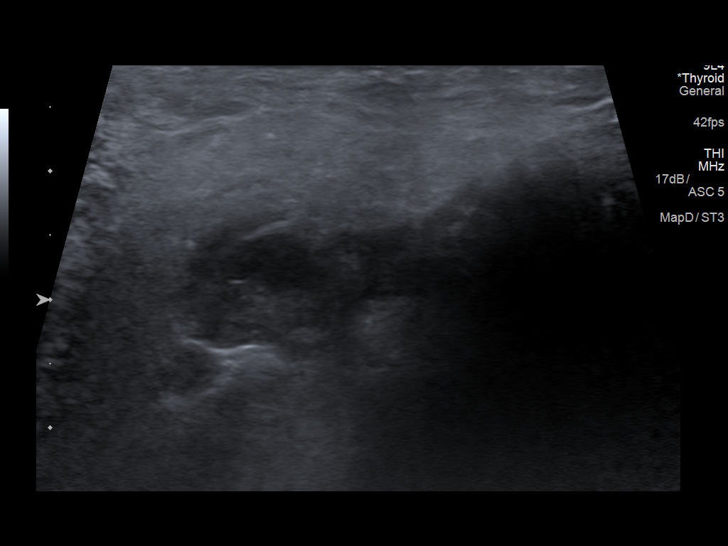

[11 of 11 positions shown; findings below may reference images not displayed]

EXAM:
ULTRASOUND GUIDED FINE NEEDLE ASPIRATION OF RIGHT CERVICAL LYMPH
NODE

MEDICATIONS:
None.

ANESTHESIA/SEDATION:
Fentanyl 100 mcg IV; Versed 3.0 mg IV

Moderate Sedation Time:  20 minutes

The patient was continuously monitored during the procedure by the
interventional radiology nurse under my direct supervision.

PROCEDURE:
The procedure was explained to the patient. The risks and benefits
of the procedure were discussed and the patient's questions were
addressed. Informed consent was obtained from the patient.

Right side of the neck was evaluated with ultrasound. Hypoechoic
lymph node posterior to the right parotid gland was identified and
targeted. The overlying skin was prepped with chlorhexidine. A
sterile field was created. Skin was anesthetized with 1% lidocaine.
Using ultrasound guidance, 7 fine-needle aspirations were performed
in this hypoechoic lymph node with 25 gauge needles. Bandage placed
over the puncture site.

COMPLICATIONS:
None immediate.
FINDINGS: Hypoechoic lymph node in the right upper neck, just posterior to the
right parotid gland. No significant bleeding or hematoma formation
following the fine-needle aspirations.
IMPRESSION: Ultrasound-guided fine-needle aspirations of a right cervical lymph
node.

## 2018-05-13 ENCOUNTER — Telehealth: Payer: Self-pay | Admitting: Internal Medicine

## 2018-05-13 ENCOUNTER — Inpatient Hospital Stay: Payer: Medicare Other

## 2018-05-13 ENCOUNTER — Inpatient Hospital Stay (HOSPITAL_BASED_OUTPATIENT_CLINIC_OR_DEPARTMENT_OTHER): Payer: Medicare Other | Admitting: Internal Medicine

## 2018-05-13 ENCOUNTER — Encounter: Payer: Self-pay | Admitting: Internal Medicine

## 2018-05-13 ENCOUNTER — Inpatient Hospital Stay: Payer: Medicare Other | Attending: Oncology

## 2018-05-13 VITALS — BP 126/68 | HR 61 | Temp 97.8°F | Resp 18 | Ht >= 80 in | Wt 294.6 lb

## 2018-05-13 DIAGNOSIS — C3411 Malignant neoplasm of upper lobe, right bronchus or lung: Secondary | ICD-10-CM | POA: Insufficient documentation

## 2018-05-13 DIAGNOSIS — N189 Chronic kidney disease, unspecified: Secondary | ICD-10-CM | POA: Insufficient documentation

## 2018-05-13 DIAGNOSIS — Z923 Personal history of irradiation: Secondary | ICD-10-CM

## 2018-05-13 DIAGNOSIS — Z79899 Other long term (current) drug therapy: Secondary | ICD-10-CM | POA: Diagnosis not present

## 2018-05-13 DIAGNOSIS — E7800 Pure hypercholesterolemia, unspecified: Secondary | ICD-10-CM | POA: Diagnosis not present

## 2018-05-13 DIAGNOSIS — C3491 Malignant neoplasm of unspecified part of right bronchus or lung: Secondary | ICD-10-CM

## 2018-05-13 DIAGNOSIS — I129 Hypertensive chronic kidney disease with stage 1 through stage 4 chronic kidney disease, or unspecified chronic kidney disease: Secondary | ICD-10-CM | POA: Insufficient documentation

## 2018-05-13 DIAGNOSIS — Z5112 Encounter for antineoplastic immunotherapy: Secondary | ICD-10-CM | POA: Insufficient documentation

## 2018-05-13 DIAGNOSIS — C7951 Secondary malignant neoplasm of bone: Secondary | ICD-10-CM | POA: Diagnosis not present

## 2018-05-13 DIAGNOSIS — Z9221 Personal history of antineoplastic chemotherapy: Secondary | ICD-10-CM | POA: Insufficient documentation

## 2018-05-13 DIAGNOSIS — Z87442 Personal history of urinary calculi: Secondary | ICD-10-CM | POA: Diagnosis not present

## 2018-05-13 DIAGNOSIS — J449 Chronic obstructive pulmonary disease, unspecified: Secondary | ICD-10-CM | POA: Insufficient documentation

## 2018-05-13 DIAGNOSIS — I10 Essential (primary) hypertension: Secondary | ICD-10-CM

## 2018-05-13 DIAGNOSIS — C3490 Malignant neoplasm of unspecified part of unspecified bronchus or lung: Secondary | ICD-10-CM

## 2018-05-13 LAB — CMP (CANCER CENTER ONLY)
ALK PHOS: 86 U/L (ref 38–126)
ALT: 32 U/L (ref 0–44)
AST: 30 U/L (ref 15–41)
Albumin: 3.7 g/dL (ref 3.5–5.0)
Anion gap: 8 (ref 5–15)
BUN: 15 mg/dL (ref 8–23)
CALCIUM: 9.3 mg/dL (ref 8.9–10.3)
CO2: 27 mmol/L (ref 22–32)
Chloride: 104 mmol/L (ref 98–111)
Creatinine: 0.97 mg/dL (ref 0.61–1.24)
GFR, Est AFR Am: >60 mL/min (ref >60)
Glucose, Bld: 135 mg/dL — ABNORMAL HIGH (ref 70–99)
Potassium: 3.8 mmol/L (ref 3.5–5.1)
Sodium: 139 mmol/L (ref 135–145)
Total Bilirubin: 1.1 mg/dL (ref 0.3–1.2)
Total Protein: 6.9 g/dL (ref 6.5–8.1)

## 2018-05-13 LAB — CBC WITH DIFFERENTIAL (CANCER CENTER ONLY)
Abs Immature Granulocytes: 0 10*3/uL (ref 0.00–0.07)
Basophils Absolute: 0.1 10*3/uL (ref 0.0–0.1)
Basophils Relative: 1 %
EOS ABS: 0.1 10*3/uL (ref 0.0–0.5)
EOS PCT: 3 %
HCT: 42.7 % (ref 39.0–52.0)
Hemoglobin: 13.9 g/dL (ref 13.0–17.0)
Immature Granulocytes: 0 %
Lymphocytes Relative: 35 %
Lymphs Abs: 1.7 10*3/uL (ref 0.7–4.0)
MCH: 27.4 pg (ref 26.0–34.0)
MCHC: 32.6 g/dL (ref 30.0–36.0)
MCV: 84.2 fL (ref 80.0–100.0)
Monocytes Absolute: 0.6 10*3/uL (ref 0.1–1.0)
Monocytes Relative: 12 %
Neutro Abs: 2.4 10*3/uL (ref 1.7–7.7)
Neutrophils Relative %: 49 %
PLATELETS: 161 10*3/uL (ref 150–400)
RBC: 5.07 MIL/uL (ref 4.22–5.81)
RDW: 14.1 % (ref 11.5–15.5)
WBC: 4.8 10*3/uL (ref 4.0–10.5)
nRBC: 0 % (ref 0.0–0.2)

## 2018-05-13 LAB — TSH: TSH: 3.114 u[IU]/mL (ref 0.320–4.118)

## 2018-05-13 MED ORDER — SODIUM CHLORIDE 0.9 % IV SOLN
Freq: Once | INTRAVENOUS | Status: AC
  Administered 2018-05-13: 13:00:00 via INTRAVENOUS
  Filled 2018-05-13: qty 250

## 2018-05-13 MED ORDER — SODIUM CHLORIDE 0.9 % IV SOLN
200.0000 mg | Freq: Once | INTRAVENOUS | Status: AC
  Administered 2018-05-13: 200 mg via INTRAVENOUS
  Filled 2018-05-13: qty 8

## 2018-05-13 NOTE — Telephone Encounter (Signed)
Scheduled appt per 02/11 los.  Gave patient contrast and the number to central radiology.  Printed calendar and avs.

## 2018-05-13 NOTE — Progress Notes (Signed)
[facility] [phone]   [phone]  OFFICE PROGRESS NOTE  Sheryle Hail, PA-C [address] [place] [address]  DIAGNOSIS: Stage IV (T2a,N3,M1c)non-small cell lung cancer, squamous cell carcinoma presented with right upper lobe lung mass in addition to right hilar and mediastinal adenopathy as well as metastatic disease in the medial right thigh diagnosed in November 2018.  PRIOR THERAPY:  1) Palliative radiotherapy to the right lower extremity mass completed April 09, 2017. 2) Systemic chemotherapy with carboplatin for AUC of 5, paclitaxel 175 mg/M2 and Keytruda 200 mg IV every 3 weeks.  First dose March 12, 2017, status post 4 cycles with partial response.  CURRENT THERAPY: Maintenance treatment with single agent Keytruda 200 mg IV every 3 weeks.  First cycle June 11, 2017.  Status post 20 cycles.  INTERVAL HISTORY: Aaron Sellers 68 y.o. male returns to the clinic today for follow-up visit accompanied by his wife.  The patient is feeling fine today with no concerning complaints.  He denied having any chest pain, shortness of breath except with exertion with no cough or hemoptysis.  He has no nausea, vomiting, diarrhea or constipation.  He denied having any fever or chills.  He has no headache or visual changes.  He is here today for evaluation before starting cycle #21.  MEDICAL HISTORY: Past Medical History:  Diagnosis Date  . Arthritis   . Atrial flutter ([place])   . BPH (benign prostatic hypertrophy) with urinary retention   . Cancer ([place] [facility])   . Chronic kidney disease    hx of kidney stones  2011  . COPD (chronic obstructive pulmonary disease) ([place])    has been smoking since he was 22 ish--  . High cholesterol   . Hypertension    dx around 2011  . Squamous cell carcinoma of right lung ([facility])     ALLERGIES:  is allergic to ciprofloxacin; adhesive [tape]; and codeine.  MEDICATIONS:  Current Outpatient Medications    Medication Sig Dispense Refill  . acetaminophen (TYLENOL) 500 MG tablet Take 1,000 mg by mouth every 6 (six) hours as needed for moderate pain.    Marland Kitchen atorvastatin (LIPITOR) 10 MG tablet Take 10 mg by mouth every evening.     . Cyanocobalamin (VITAMIN B-12 PO) Take 1 tablet by mouth daily.    Marland Kitchen diltiazem (CARDIZEM CD) 180 MG 24 hr capsule Take 1 capsule (180 mg total) by mouth daily. 90 capsule 1  . famotidine (PEPCID) 10 MG tablet Take 10 mg by mouth as needed for heartburn or indigestion.    . furosemide (LASIX) 40 MG tablet Take 1 tablet (40 mg total) by mouth daily as needed. 90 tablet 3  . gabapentin (NEURONTIN) 300 MG capsule Take 1 capsule (300 mg total) by mouth 3 (three) times daily. 90 capsule 0  . loratadine (CLARITIN) 10 MG tablet Take 10 mg by mouth daily.    . tamsulosin (FLOMAX) 0.4 MG CAPS capsule Take 0.4 mg by mouth daily.     No current facility-administered medications for this visit.    Facility-Administered Medications Ordered in Other Visits  Medication Dose Route Frequency Provider Last Rate Last Dose  . heparin lock flush 100 unit/mL  500 Units Intracatheter Once PRN Curt Bears, MD      . sodium chloride flush (NS) 0.9 % injection 10 mL  10 mL Intracatheter PRN Curt Bears, MD        SURGICAL HISTORY:  Past Surgical History:  Procedure Laterality  Date  . HERNIA REPAIR     umbilical hernia  11/5275  . INCISION AND DRAINAGE ABSCESS     "down the middle" of his buttocks  2015  . TOTAL KNEE ARTHROPLASTY Left 10/29/2014   Procedure: TOTAL KNEE ARTHROPLASTY;  Surgeon: Dorna Leitz, MD;  Location: [facility];  Service: Orthopedics;  Laterality: Left;    REVIEW OF SYSTEMS:  A comprehensive review of systems was negative except for: Respiratory: positive for dyspnea on exertion   PHYSICAL EXAMINATION: General appearance: alert, cooperative and no distress Head: Normocephalic, without obvious abnormality, atraumatic Neck: no adenopathy, no JVD, supple, symmetrical,  trachea midline and thyroid not enlarged, symmetric, no tenderness/mass/nodules Lymph nodes: Cervical, supraclavicular, and axillary nodes normal. Resp: clear to auscultation bilaterally Back: symmetric, no curvature. ROM normal. No CVA tenderness. Cardio: regular rate and rhythm, S1, S2 normal, no murmur, click, rub or gallop GI: soft, non-tender; bowel sounds normal; no masses,  no organomegaly Extremities: extremities normal, atraumatic, no cyanosis or edema  ECOG PERFORMANCE STATUS: 1 - Symptomatic but completely ambulatory  Blood pressure 126/68, pulse 61, temperature 97.8 F (36.6 C), temperature source Oral, resp. rate 18, height 6\' 8"  (2.032 m), weight 294 lb 9.6 oz (133.6 kg), SpO2 96 %.  LABORATORY DATA: Lab Results  Component Value Date   WBC 4.8 05/13/2018   HGB 13.9 05/13/2018   HCT 42.7 05/13/2018   MCV 84.2 05/13/2018   PLT 161 05/13/2018      Chemistry      Component Value Date/Time   NA 139 05/13/2018 1050   NA 137 04/03/2017 1030   K 3.8 05/13/2018 1050   K 4.1 04/03/2017 1030   CL 104 05/13/2018 1050   CO2 27 05/13/2018 1050   CO2 27 04/03/2017 1030   BUN 15 05/13/2018 1050   BUN 15.5 04/03/2017 1030   CREATININE 0.97 05/13/2018 1050   CREATININE 0.8 04/03/2017 1030      Component Value Date/Time   CALCIUM 9.3 05/13/2018 1050   CALCIUM 9.4 04/03/2017 1030   ALKPHOS 86 05/13/2018 1050   ALKPHOS 97 04/03/2017 1030   AST 30 05/13/2018 1050   AST 14 04/03/2017 1030   ALT 32 05/13/2018 1050   ALT 21 04/03/2017 1030   BILITOT 1.1 05/13/2018 1050   BILITOT 0.67 04/03/2017 1030       RADIOGRAPHIC STUDIES: No results found.  ASSESSMENT AND PLAN: This is a very pleasant 68 years old white male with a stage IV non-small cell lung cancer, squamous cell carcinoma.  The patient is currently undergoing systemic chemotherapy with carboplatin, paclitaxel and Keytruda status post 4 cycles with partial response. He is currently undergoing maintenance treatment  with single agent Keytruda status post 20 cycles. The patient continues to tolerate this treatment well with no concerning complaints. I recommended for him to proceed with cycle #21 today as scheduled. I will see him back for follow-up visit in 3 weeks for evaluation after repeating CT scan of the chest, abdomen and pelvis for restaging of his disease. The patient was advised to call immediately if he has any concerning symptoms in the interval. The patient voices understanding of current disease status and treatment options and is in agreement with the current care plan. All questions were answered. The patient knows to call the clinic with any problems, questions or concerns. We can certainly see the patient much sooner if necessary.  Disclaimer: This note was dictated with voice recognition software. Similar sounding words can inadvertently be transcribed and may not  be corrected upon review.

## 2018-05-13 NOTE — Patient Instructions (Signed)
[facility] Discharge Instructions for Patients Receiving Chemotherapy  Today you received the following chemotherapy agents:  Beryle Flock  To help prevent nausea and vomiting after your treatment, we encourage you to take your nausea medication as needed.    If you develop nausea and vomiting that is not controlled by your nausea medication, call the clinic.   BELOW ARE SYMPTOMS THAT SHOULD BE REPORTED IMMEDIATELY:  *FEVER GREATER THAN 100.5 F  *CHILLS WITH OR WITHOUT FEVER  NAUSEA AND VOMITING THAT IS NOT CONTROLLED WITH YOUR NAUSEA MEDICATION  *UNUSUAL SHORTNESS OF BREATH  *UNUSUAL BRUISING OR BLEEDING  TENDERNESS IN MOUTH AND THROAT WITH OR WITHOUT PRESENCE OF ULCERS  *URINARY PROBLEMS  *BOWEL PROBLEMS  UNUSUAL RASH Items with * indicate a potential emergency and should be followed up as soon as possible.  Feel free to call the clinic should you have any questions or concerns. The clinic phone number is [phone].  Please show the [facility] at check-in to the Emergency Department and triage nurse.

## 2018-05-29 ENCOUNTER — Other Ambulatory Visit: Payer: Self-pay | Admitting: Internal Medicine

## 2018-05-29 DIAGNOSIS — G6289 Other specified polyneuropathies: Secondary | ICD-10-CM

## 2018-05-30 ENCOUNTER — Ambulatory Visit (HOSPITAL_COMMUNITY)
Admission: RE | Admit: 2018-05-30 | Discharge: 2018-05-30 | Disposition: A | Discharge status: Home / Self Care | Payer: Medicare Other | Source: Ambulatory Visit | Attending: Internal Medicine | Admitting: Internal Medicine

## 2018-05-30 DIAGNOSIS — C3490 Malignant neoplasm of unspecified part of unspecified bronchus or lung: Secondary | ICD-10-CM | POA: Insufficient documentation

## 2018-05-30 MED ORDER — IOHEXOL 300 MG/ML  SOLN
100.0000 mL | Freq: Once | INTRAMUSCULAR | Status: AC | PRN
  Administered 2018-05-30: 100 mL via INTRAVENOUS

## 2018-05-30 MED ORDER — SODIUM CHLORIDE (PF) 0.9 % IJ SOLN
INTRAMUSCULAR | Status: AC
  Filled 2018-05-30: qty 50

## 2018-06-02 ENCOUNTER — Other Ambulatory Visit: Payer: Self-pay | Admitting: Internal Medicine

## 2018-06-02 ENCOUNTER — Telehealth: Payer: Self-pay | Admitting: Medical Oncology

## 2018-06-02 NOTE — Telephone Encounter (Signed)
Holley Raring is sick and cannot come with pt to appt. She wants to know ct results tomorrow after [facility] meets with pt.

## 2018-06-03 ENCOUNTER — Telehealth: Payer: Self-pay | Admitting: Internal Medicine

## 2018-06-03 ENCOUNTER — Inpatient Hospital Stay (HOSPITAL_BASED_OUTPATIENT_CLINIC_OR_DEPARTMENT_OTHER): Payer: Medicare Other | Admitting: Internal Medicine

## 2018-06-03 ENCOUNTER — Inpatient Hospital Stay: Payer: Medicare Other

## 2018-06-03 ENCOUNTER — Encounter: Payer: Self-pay | Admitting: Internal Medicine

## 2018-06-03 ENCOUNTER — Inpatient Hospital Stay: Payer: Medicare Other | Attending: Oncology

## 2018-06-03 VITALS — BP 136/75 | HR 70 | Temp 98.1°F | Resp 18 | Ht >= 80 in | Wt 290.6 lb

## 2018-06-03 DIAGNOSIS — R197 Diarrhea, unspecified: Secondary | ICD-10-CM | POA: Diagnosis not present

## 2018-06-03 DIAGNOSIS — Z9221 Personal history of antineoplastic chemotherapy: Secondary | ICD-10-CM

## 2018-06-03 DIAGNOSIS — N189 Chronic kidney disease, unspecified: Secondary | ICD-10-CM | POA: Diagnosis not present

## 2018-06-03 DIAGNOSIS — I4892 Unspecified atrial flutter: Secondary | ICD-10-CM

## 2018-06-03 DIAGNOSIS — N400 Enlarged prostate without lower urinary tract symptoms: Secondary | ICD-10-CM | POA: Insufficient documentation

## 2018-06-03 DIAGNOSIS — C3491 Malignant neoplasm of unspecified part of right bronchus or lung: Secondary | ICD-10-CM

## 2018-06-03 DIAGNOSIS — J029 Acute pharyngitis, unspecified: Secondary | ICD-10-CM | POA: Insufficient documentation

## 2018-06-03 DIAGNOSIS — I129 Hypertensive chronic kidney disease with stage 1 through stage 4 chronic kidney disease, or unspecified chronic kidney disease: Secondary | ICD-10-CM | POA: Diagnosis not present

## 2018-06-03 DIAGNOSIS — Z923 Personal history of irradiation: Secondary | ICD-10-CM | POA: Diagnosis not present

## 2018-06-03 DIAGNOSIS — Z87442 Personal history of urinary calculi: Secondary | ICD-10-CM

## 2018-06-03 DIAGNOSIS — Z79899 Other long term (current) drug therapy: Secondary | ICD-10-CM | POA: Insufficient documentation

## 2018-06-03 DIAGNOSIS — Z5112 Encounter for antineoplastic immunotherapy: Secondary | ICD-10-CM

## 2018-06-03 DIAGNOSIS — C7951 Secondary malignant neoplasm of bone: Secondary | ICD-10-CM | POA: Insufficient documentation

## 2018-06-03 DIAGNOSIS — R5383 Other fatigue: Secondary | ICD-10-CM | POA: Diagnosis not present

## 2018-06-03 DIAGNOSIS — K5730 Diverticulosis of large intestine without perforation or abscess without bleeding: Secondary | ICD-10-CM | POA: Diagnosis not present

## 2018-06-03 DIAGNOSIS — J449 Chronic obstructive pulmonary disease, unspecified: Secondary | ICD-10-CM

## 2018-06-03 DIAGNOSIS — C3411 Malignant neoplasm of upper lobe, right bronchus or lung: Secondary | ICD-10-CM

## 2018-06-03 DIAGNOSIS — M1990 Unspecified osteoarthritis, unspecified site: Secondary | ICD-10-CM | POA: Insufficient documentation

## 2018-06-03 DIAGNOSIS — R509 Fever, unspecified: Secondary | ICD-10-CM | POA: Diagnosis not present

## 2018-06-03 DIAGNOSIS — E7800 Pure hypercholesterolemia, unspecified: Secondary | ICD-10-CM

## 2018-06-03 DIAGNOSIS — I10 Essential (primary) hypertension: Secondary | ICD-10-CM

## 2018-06-03 LAB — CBC WITH DIFFERENTIAL (CANCER CENTER ONLY)
Abs Immature Granulocytes: 0.01 10*3/uL (ref 0.00–0.07)
Basophils Absolute: 0 10*3/uL (ref 0.0–0.1)
Basophils Relative: 1 %
Eosinophils Absolute: 0.1 10*3/uL (ref 0.0–0.5)
Eosinophils Relative: 2 %
HCT: 43.9 % (ref 39.0–52.0)
Hemoglobin: 14.4 g/dL (ref 13.0–17.0)
Immature Granulocytes: 0 %
Lymphocytes Relative: 32 %
Lymphs Abs: 1.4 10*3/uL (ref 0.7–4.0)
MCH: 28 pg (ref 26.0–34.0)
MCHC: 32.8 g/dL (ref 30.0–36.0)
MCV: 85.2 fL (ref 80.0–100.0)
Monocytes Absolute: 0.7 10*3/uL (ref 0.1–1.0)
Monocytes Relative: 16 %
Neutro Abs: 2.2 10*3/uL (ref 1.7–7.7)
Neutrophils Relative %: 49 %
Platelet Count: 142 10*3/uL — ABNORMAL LOW (ref 150–400)
RBC: 5.15 MIL/uL (ref 4.22–5.81)
RDW: 14.1 % (ref 11.5–15.5)
WBC Count: 4.5 10*3/uL (ref 4.0–10.5)
nRBC: 0 % (ref 0.0–0.2)

## 2018-06-03 LAB — CMP (CANCER CENTER ONLY)
ALT: 35 U/L (ref 0–44)
AST: 34 U/L (ref 15–41)
Albumin: 3.8 g/dL (ref 3.5–5.0)
Alkaline Phosphatase: 93 U/L (ref 38–126)
Anion gap: 10 (ref 5–15)
BUN: 12 mg/dL (ref 8–23)
CO2: 24 mmol/L (ref 22–32)
Calcium: 8.9 mg/dL (ref 8.9–10.3)
Chloride: 104 mmol/L (ref 98–111)
Creatinine: 0.97 mg/dL (ref 0.61–1.24)
GFR, Est AFR Am: >60 mL/min (ref >60)
Glucose, Bld: 133 mg/dL — ABNORMAL HIGH (ref 70–99)
Potassium: 3.6 mmol/L (ref 3.5–5.1)
Sodium: 138 mmol/L (ref 135–145)
TOTAL PROTEIN: 7.1 g/dL (ref 6.5–8.1)
Total Bilirubin: 1.1 mg/dL (ref 0.3–1.2)

## 2018-06-03 MED ORDER — SODIUM CHLORIDE 0.9 % IV SOLN
200.0000 mg | Freq: Once | INTRAVENOUS | Status: AC
  Administered 2018-06-03: 200 mg via INTRAVENOUS
  Filled 2018-06-03: qty 8

## 2018-06-03 MED ORDER — SODIUM CHLORIDE 0.9 % IV SOLN
Freq: Once | INTRAVENOUS | Status: AC
  Administered 2018-06-03: 12:00:00 via INTRAVENOUS
  Filled 2018-06-03: qty 250

## 2018-06-03 NOTE — Telephone Encounter (Signed)
Scheduled appt per 3/3 los - pt to get an updated schedule next visit.

## 2018-06-03 NOTE — Patient Instructions (Signed)
[facility] Discharge Instructions for Patients Receiving Chemotherapy  Today you received the following chemotherapy agents:  Keytruda.  To help prevent nausea and vomiting after your treatment, we encourage you to take your nausea medication as directed.   If you develop nausea and vomiting that is not controlled by your nausea medication, call the clinic.   BELOW ARE SYMPTOMS THAT SHOULD BE REPORTED IMMEDIATELY:  *FEVER GREATER THAN 100.5 F  *CHILLS WITH OR WITHOUT FEVER  NAUSEA AND VOMITING THAT IS NOT CONTROLLED WITH YOUR NAUSEA MEDICATION  *UNUSUAL SHORTNESS OF BREATH  *UNUSUAL BRUISING OR BLEEDING  TENDERNESS IN MOUTH AND THROAT WITH OR WITHOUT PRESENCE OF ULCERS  *URINARY PROBLEMS  *BOWEL PROBLEMS  UNUSUAL RASH Items with * indicate a potential emergency and should be followed up as soon as possible.  Feel free to call the clinic should you have any questions or concerns. The clinic phone number is [phone].  Please show the CHEMO ALERT CARD at check-in to the Emergency Department and triage nurse.    

## 2018-06-03 NOTE — Progress Notes (Signed)
[place] [phone]   [phone]  OFFICE PROGRESS NOTE  Sheryle Hail, PA-C [address] [place] [address]  DIAGNOSIS: Stage IV (T2a,N3,M1c)non-small cell lung cancer, squamous cell carcinoma presented with right upper lobe lung mass in addition to right hilar and mediastinal adenopathy as well as metastatic disease in the medial right thigh diagnosed in November 2018.  PRIOR THERAPY:  1) Palliative radiotherapy to the right lower extremity mass completed April 09, 2017. 2) Systemic chemotherapy with carboplatin for AUC of 5, paclitaxel 175 mg/M2 and Keytruda 200 mg IV every 3 weeks.  First dose March 12, 2017, status post 4 cycles with partial response.  CURRENT THERAPY: Maintenance treatment with single agent Keytruda 200 mg IV every 3 weeks.  First cycle June 11, 2017.  Status post 21 cycles.  INTERVAL HISTORY: Aaron Sellers 68 y.o. male returns to the clinic today for follow-up visit.  The patient is feeling fine today with no concerning complaints except for low-grade fever as well as sore throat and mild cough.  His wife was recently diagnosed with pneumonia.  He denied having any chest pain, shortness of breath or hemoptysis.  He denied having any fever or chills.  He has no nausea, vomiting, diarrhea or constipation.  He has no headache or visual changes.  He has been tolerating his treatment with Keytruda fairly well.  The patient had repeat CT scan of the chest, abdomen and pelvis performed recently and is here for evaluation and discussion of his scan results.  MEDICAL HISTORY: Past Medical History:  Diagnosis Date  . Arthritis   . Atrial flutter ([facility])   . BPH (benign prostatic hypertrophy) with urinary retention   . Cancer ([facility])   . Chronic kidney disease    hx of kidney stones  2011  . COPD (chronic obstructive pulmonary disease) ([facility])    has been smoking since he was 22 ish--  . High cholesterol   .  Hypertension    dx around 2011  . Squamous cell carcinoma of right lung ([facility])     ALLERGIES:  is allergic to ciprofloxacin; adhesive [tape]; and codeine.  MEDICATIONS:  Current Outpatient Medications  Medication Sig Dispense Refill  . acetaminophen (TYLENOL) 500 MG tablet Take 1,000 mg by mouth every 6 (six) hours as needed for moderate pain.    Marland Kitchen atorvastatin (LIPITOR) 10 MG tablet Take 10 mg by mouth every evening.     . Cyanocobalamin (VITAMIN B-12 PO) Take 1 tablet by mouth daily.    Marland Kitchen diltiazem (CARDIZEM CD) 180 MG 24 hr capsule Take 1 capsule (180 mg total) by mouth daily. 90 capsule 1  . famotidine (PEPCID) 10 MG tablet Take 10 mg by mouth as needed for heartburn or indigestion.    . furosemide (LASIX) 40 MG tablet Take 1 tablet (40 mg total) by mouth daily as needed. 90 tablet 3  . gabapentin (NEURONTIN) 300 MG capsule TAKE 1 CAPSULE BY MOUTH THREE TIMES DAILY 90 capsule 0  . loratadine (CLARITIN) 10 MG tablet Take 10 mg by mouth daily.    . tamsulosin (FLOMAX) 0.4 MG CAPS capsule Take 0.4 mg by mouth daily.     No current facility-administered medications for this visit.    Facility-Administered Medications Ordered in Other Visits  Medication Dose Route Frequency Provider Last Rate Last Dose  . heparin lock flush 100 unit/mL  500 Units Intracatheter Once PRN Curt Bears, MD      . sodium chloride  flush (NS) 0.9 % injection 10 mL  10 mL Intracatheter PRN Curt Bears, MD        SURGICAL HISTORY:  Past Surgical History:  Procedure Laterality Date  . HERNIA REPAIR     umbilical hernia  09/4257  . INCISION AND DRAINAGE ABSCESS     "down the middle" of his buttocks  2015  . TOTAL KNEE ARTHROPLASTY Left 10/29/2014   Procedure: TOTAL KNEE ARTHROPLASTY;  Surgeon: Dorna Leitz, MD;  Location: [facility];  Service: Orthopedics;  Laterality: Left;    REVIEW OF SYSTEMS:  Constitutional: positive for fatigue and fevers Eyes: negative Ears, nose, mouth, throat, and face: positive  for sore throat Respiratory: negative Cardiovascular: negative Gastrointestinal: negative Genitourinary:negative Integument/breast: negative Hematologic/lymphatic: negative Musculoskeletal:negative Neurological: negative Behavioral/Psych: negative Endocrine: negative Allergic/Immunologic: negative   PHYSICAL EXAMINATION: General appearance: alert, cooperative, fatigued and no distress Head: Normocephalic, without obvious abnormality, atraumatic Neck: no adenopathy, no JVD, supple, symmetrical, trachea midline and thyroid not enlarged, symmetric, no tenderness/mass/nodules Lymph nodes: Cervical, supraclavicular, and axillary nodes normal. Resp: clear to auscultation bilaterally Back: symmetric, no curvature. ROM normal. No CVA tenderness. Cardio: regular rate and rhythm, S1, S2 normal, no murmur, click, rub or gallop GI: soft, non-tender; bowel sounds normal; no masses,  no organomegaly Extremities: extremities normal, atraumatic, no cyanosis or edema Neurologic: Alert and oriented X 3, normal strength and tone. Normal symmetric reflexes. Normal coordination and gait  ECOG PERFORMANCE STATUS: 1 - Symptomatic but completely ambulatory  Blood pressure 136/75, pulse 70, temperature 98.1 F (36.7 C), temperature source Oral, resp. rate 18, height 6\' 8"  (2.032 m), weight 290 lb 9.6 oz (131.8 kg), SpO2 96 %.  LABORATORY DATA: Lab Results  Component Value Date   WBC 4.5 06/03/2018   HGB 14.4 06/03/2018   HCT 43.9 06/03/2018   MCV 85.2 06/03/2018   PLT 142 (L) 06/03/2018      Chemistry      Component Value Date/Time   NA 139 05/13/2018 1050   NA 137 04/03/2017 1030   K 3.8 05/13/2018 1050   K 4.1 04/03/2017 1030   CL 104 05/13/2018 1050   CO2 27 05/13/2018 1050   CO2 27 04/03/2017 1030   BUN 15 05/13/2018 1050   BUN 15.5 04/03/2017 1030   CREATININE 0.97 05/13/2018 1050   CREATININE 0.8 04/03/2017 1030      Component Value Date/Time   CALCIUM 9.3 05/13/2018 1050    CALCIUM 9.4 04/03/2017 1030   ALKPHOS 86 05/13/2018 1050   ALKPHOS 97 04/03/2017 1030   AST 30 05/13/2018 1050   AST 14 04/03/2017 1030   ALT 32 05/13/2018 1050   ALT 21 04/03/2017 1030   BILITOT 1.1 05/13/2018 1050   BILITOT 0.67 04/03/2017 1030       RADIOGRAPHIC STUDIES: Ct Chest W Contrast  Result Date: 05/31/2018 CLINICAL DATA:  Lung cancer. Status post immunotherapy and chemotherapy. EXAM: CT CHEST, ABDOMEN, AND PELVIS WITH CONTRAST TECHNIQUE: Multidetector CT imaging of the chest, abdomen and pelvis was performed following the standard protocol during bolus administration of intravenous contrast. CONTRAST:  174mL OMNIPAQUE IOHEXOL 300 MG/ML  SOLN COMPARISON:  03/31/2018 FINDINGS: CT CHEST FINDINGS Cardiovascular: Heart size appears normal. Aortic atherosclerosis. Calcification in the LAD coronary artery identified. Mediastinum/Nodes: Posterior right lobe of thyroid gland nodule measures 1.5 cm, unchanged. The trachea appears patent and is midline. Normal appearance of the esophagus. Prominent thoracic lymph nodes are again identified: -index left axillary lymph node measures 1.3 cm, image 20/2. Previously 1.7 cm. -the index  subcarinal lymph node measures 1.3 cm, image 32/2. Previously same. -index right hilar node measures 1.1 cm, image 31/2. Stable no new or progressive adenopathy within the chest. Lungs/Pleura: No pleural effusion. No airspace consolidation, atelectasis or pneumothorax identified. Small pulmonary nodules are again identified. -the index nodule within the lateral right upper lobe with spiculated margins measures 1.5 cm, image 50/4. Previously 1.7 cm. -index 4 mm anterior right upper lobe lung nodule is noted, image 54/4. Stable. -index nodule within the medial left apex measures 6 mm, image 39/4. Previously 7 mm. -index left upper lobe lung nodule measures 5 mm, image 77/4. Stable. -no new or enlarging nodules identified. Musculoskeletal: No chest wall mass or suspicious bone  lesions identified. CT ABDOMEN PELVIS FINDINGS Hepatobiliary: Hypertrophy of the lateral segment of left lobe of liver is again noted. Mild liver surface irregularity is also noted. Findings raise the possibility of early cirrhosis. No focal liver abnormality identified. Gallbladder is normal. No biliary dilatation. Pancreas: Unremarkable. No pancreatic ductal dilatation or surrounding inflammatory changes. Spleen: Normal in size without focal abnormality. Adrenals/Urinary Tract: Previously characterized right adrenal gland adenoma measuring 3.1 cm appears stable, image 65/2. unremarkable appearance of the left adrenal gland. Stable right kidney cysts. No enhancing mass or hydronephrosis identified. The urinary bladder appears normal. Stomach/Bowel: Stomach is normal. The small bowel loops have a normal course and caliber. The appendix is visualized and appears normal. Diffuse colonic diverticulosis identified without acute inflammation. Vascular/Lymphatic: Aortic atherosclerosis. Infrarenal abdominal aortic aneurysm is unchanged measuring 3 cm. The portal vein and hepatic veins appear patent. 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 Mild upper abdominal adenopathy: -peripancreatic lymph node measures 1.7 cm, image 69/2. Previously 1.9 cm. -Portacaval node measures 1.7 cm, image 74/2.  Previously 2.0 cm. No pelvic or inguinal adenopathy. Reproductive: Prostate is unremarkable. Other: No free fluid or fluid collections. Musculoskeletal: No aggressive lytic or sclerotic bone lesions identified. Degenerative disc disease identified at L5-S1. IMPRESSION: 1. Stable exam. No new or progressive disease identified within the chest, abdomen and pelvis. 2. Stable appearance of pulmonary nodules including right upper lobe primary pulmonary lesion. 3. Stable to improved mild thoracic and upper abdominal adenopathy. 4. Stable morphologic features of liver suggestive of cirrhosis. There is a left abdominal splenorenal varix  which may reflect sequelae of portal venous hypertension. 5. Diffuse bronchial wall thickening with emphysema, as above; imaging findings suggestive of underlying COPD. 6. Aortic atherosclerosis and coronary artery calcification 7. Stable infrarenal abdominal aortic aneurysm measuring 3 cm. Aortic Atherosclerosis (ICD10-I70.0) and Emphysema (ICD10-J43.9). Aortic aneurysm NOS (ICD10-I71.9). Electronically Signed   By: Kerby Moors M.D.   On: 05/31/2018 14:08   Ct Abdomen Pelvis W Contrast  Result Date: 05/31/2018 CLINICAL DATA:  Lung cancer. Status post immunotherapy and chemotherapy. EXAM: CT CHEST, ABDOMEN, AND PELVIS WITH CONTRAST TECHNIQUE: Multidetector CT imaging of the chest, abdomen and pelvis was performed following the standard protocol during bolus administration of intravenous contrast. CONTRAST:  155mL OMNIPAQUE IOHEXOL 300 MG/ML  SOLN COMPARISON:  03/31/2018 FINDINGS: CT CHEST FINDINGS Cardiovascular: Heart size appears normal. Aortic atherosclerosis. Calcification in the LAD coronary artery identified. Mediastinum/Nodes: Posterior right lobe of thyroid gland nodule measures 1.5 cm, unchanged. The trachea appears patent and is midline. Normal appearance of the esophagus. Prominent thoracic lymph nodes are again identified: -index left axillary lymph node measures 1.3 cm, image 20/2. Previously 1.7 cm. -the index subcarinal lymph node measures 1.3 cm, image 32/2. Previously same. -index right hilar node measures 1.1 cm, image 31/2. Stable no new or  progressive adenopathy within the chest. Lungs/Pleura: No pleural effusion. No airspace consolidation, atelectasis or pneumothorax identified. Small pulmonary nodules are again identified. -the index nodule within the lateral right upper lobe with spiculated margins measures 1.5 cm, image 50/4. Previously 1.7 cm. -index 4 mm anterior right upper lobe lung nodule is noted, image 54/4. Stable. -index nodule within the medial left apex measures 6 mm, image  39/4. Previously 7 mm. -index left upper lobe lung nodule measures 5 mm, image 77/4. Stable. -no new or enlarging nodules identified. Musculoskeletal: No chest wall mass or suspicious bone lesions identified. CT ABDOMEN PELVIS FINDINGS Hepatobiliary: Hypertrophy of the lateral segment of left lobe of liver is again noted. Mild liver surface irregularity is also noted. Findings raise the possibility of early cirrhosis. No focal liver abnormality identified. Gallbladder is normal. No biliary dilatation. Pancreas: Unremarkable. No pancreatic ductal dilatation or surrounding inflammatory changes. Spleen: Normal in size without focal abnormality. Adrenals/Urinary Tract: Previously characterized right adrenal gland adenoma measuring 3.1 cm appears stable, image 65/2. unremarkable appearance of the left adrenal gland. Stable right kidney cysts. No enhancing mass or hydronephrosis identified. The urinary bladder appears normal. Stomach/Bowel: Stomach is normal. The small bowel loops have a normal course and caliber. The appendix is visualized and appears normal. Diffuse colonic diverticulosis identified without acute inflammation. Vascular/Lymphatic: Aortic atherosclerosis. Infrarenal abdominal aortic aneurysm is unchanged measuring 3 cm. The portal vein and hepatic veins appear patent. 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 Mild upper abdominal adenopathy: -peripancreatic lymph node measures 1.7 cm, image 69/2. Previously 1.9 cm. -Portacaval node measures 1.7 cm, image 74/2.  Previously 2.0 cm. No pelvic or inguinal adenopathy. Reproductive: Prostate is unremarkable. Other: No free fluid or fluid collections. Musculoskeletal: No aggressive lytic or sclerotic bone lesions identified. Degenerative disc disease identified at L5-S1. IMPRESSION: 1. Stable exam. No new or progressive disease identified within the chest, abdomen and pelvis. 2. Stable appearance of pulmonary nodules including right upper lobe primary  pulmonary lesion. 3. Stable to improved mild thoracic and upper abdominal adenopathy. 4. Stable morphologic features of liver suggestive of cirrhosis. There is a left abdominal splenorenal varix which may reflect sequelae of portal venous hypertension. 5. Diffuse bronchial wall thickening with emphysema, as above; imaging findings suggestive of underlying COPD. 6. Aortic atherosclerosis and coronary artery calcification 7. Stable infrarenal abdominal aortic aneurysm measuring 3 cm. Aortic Atherosclerosis (ICD10-I70.0) and Emphysema (ICD10-J43.9). Aortic aneurysm NOS (ICD10-I71.9). Electronically Signed   By: Kerby Moors M.D.   On: 05/31/2018 14:08    ASSESSMENT AND PLAN: This is a very pleasant 68 years old white male with a stage IV non-small cell lung cancer, squamous cell carcinoma.  The patient is currently undergoing systemic chemotherapy with carboplatin, paclitaxel and Keytruda status post 4 cycles with partial response. He is currently undergoing maintenance treatment with single agent Keytruda status post 21 cycles. The patient has been tolerating this treatment well. He had repeat CT scan of the chest, abdomen and pelvis performed recently.  I personally and independently reviewed the scans and discussed the results with the patient today. Has a scan showed no concerning findings for disease progression. I recommended for the patient to continue his current treatment with [facility] and he will proceed with cycle #22 today. For the sore throat and low-grade fever, the patient will continue with symptomatic management for now.  He was advised to call back if he has any concerning fever or chills. The patient will come back for follow-up visit in 3 weeks for evaluation before  the next cycle of his treatment. He was advised to call immediately if he has any concerning symptoms in the interval. The patient voices understanding of current disease status and treatment options and is in agreement  with the current care plan. All questions were answered. The patient knows to call the clinic with any problems, questions or concerns. We can certainly see the patient much sooner if necessary.  Disclaimer: This note was dictated with voice recognition software. Similar sounding words can inadvertently be transcribed and may not be corrected upon review.

## 2018-06-04 ENCOUNTER — Telehealth: Payer: Self-pay | Admitting: *Deleted

## 2018-06-04 NOTE — Telephone Encounter (Signed)
Received TC from pt's wife . She states her husband is feeling better today-no fevers or chills. He still has a cough from post nasal drip but she is giving him Coricidan and robitussin for these with adequate relief. Shared the above information with Dr. Julien Nordmann.

## 2018-06-05 ENCOUNTER — Telehealth: Payer: Self-pay | Admitting: Medical Oncology

## 2018-06-05 NOTE — Telephone Encounter (Signed)
LVM re pt scan "no concerning findings for disease recurrance" and for pt to call for worsening sore throat , chills with or without fever.

## 2018-06-06 ENCOUNTER — Telehealth: Payer: Self-pay | Admitting: Internal Medicine

## 2018-06-06 NOTE — Telephone Encounter (Signed)
MM PAL 3/24 moved appointments to 3/26. Left message. Schedule mailed.

## 2018-06-06 NOTE — Telephone Encounter (Signed)
Glenda notified of lab and  ct results. She is concerned if he needs to f/u elevated glucose. Message to [place].

## 2018-06-09 ENCOUNTER — Telehealth: Payer: Self-pay | Admitting: Medical Oncology

## 2018-06-09 NOTE — Telephone Encounter (Signed)
[place] notified.

## 2018-06-09 NOTE — Telephone Encounter (Signed)
-----   Message from Curt Bears, MD sent at 06/07/2018  2:06 PM EST ----- Regarding: RE: elevated glucose past month PCP to handle. ----- Message ----- From: Ardeen Garland, RN Sent: 06/06/2018  12:41 PM EST To: Curt Bears, MD Subject: elevated glucose past month                    Does he need to f/u hyperglycemia? Glucose Fluctuating since nov She said he is not eating much.

## 2018-06-10 ENCOUNTER — Telehealth: Payer: Self-pay | Admitting: *Deleted

## 2018-06-10 NOTE — Telephone Encounter (Signed)
"  Robyne Peers RN, [facility] [phone]).  Munachimso Palin said Dr. Julien Nordmann told him to come here for lab work.  We do not have any orders.  Our Fax number is [phone]." "When were labs drawn there?  Could you fax most recent lab results.  We are the PCP." See 06-09-2018 telephone encounter note.

## 2018-06-24 ENCOUNTER — Ambulatory Visit: Payer: Medicare Other | Admitting: Internal Medicine

## 2018-06-24 ENCOUNTER — Ambulatory Visit: Payer: Medicare Other

## 2018-06-24 ENCOUNTER — Other Ambulatory Visit: Payer: Medicare Other

## 2018-06-25 ENCOUNTER — Other Ambulatory Visit: Payer: Self-pay | Admitting: Medical Oncology

## 2018-06-25 DIAGNOSIS — C3491 Malignant neoplasm of unspecified part of right bronchus or lung: Secondary | ICD-10-CM

## 2018-06-25 DIAGNOSIS — R5382 Chronic fatigue, unspecified: Secondary | ICD-10-CM

## 2018-06-26 ENCOUNTER — Inpatient Hospital Stay: Payer: Medicare Other

## 2018-06-26 ENCOUNTER — Encounter: Payer: Self-pay | Admitting: Internal Medicine

## 2018-06-26 ENCOUNTER — Inpatient Hospital Stay (HOSPITAL_BASED_OUTPATIENT_CLINIC_OR_DEPARTMENT_OTHER): Payer: Medicare Other | Admitting: Internal Medicine

## 2018-06-26 ENCOUNTER — Other Ambulatory Visit: Payer: Self-pay

## 2018-06-26 VITALS — BP 125/76 | HR 60 | Temp 97.5°F | Resp 18 | Ht >= 80 in | Wt 284.8 lb

## 2018-06-26 DIAGNOSIS — I129 Hypertensive chronic kidney disease with stage 1 through stage 4 chronic kidney disease, or unspecified chronic kidney disease: Secondary | ICD-10-CM

## 2018-06-26 DIAGNOSIS — N189 Chronic kidney disease, unspecified: Secondary | ICD-10-CM

## 2018-06-26 DIAGNOSIS — I4892 Unspecified atrial flutter: Secondary | ICD-10-CM

## 2018-06-26 DIAGNOSIS — Z923 Personal history of irradiation: Secondary | ICD-10-CM

## 2018-06-26 DIAGNOSIS — C7951 Secondary malignant neoplasm of bone: Secondary | ICD-10-CM

## 2018-06-26 DIAGNOSIS — Z9221 Personal history of antineoplastic chemotherapy: Secondary | ICD-10-CM

## 2018-06-26 DIAGNOSIS — C3491 Malignant neoplasm of unspecified part of right bronchus or lung: Secondary | ICD-10-CM

## 2018-06-26 DIAGNOSIS — M1990 Unspecified osteoarthritis, unspecified site: Secondary | ICD-10-CM

## 2018-06-26 DIAGNOSIS — K5730 Diverticulosis of large intestine without perforation or abscess without bleeding: Secondary | ICD-10-CM

## 2018-06-26 DIAGNOSIS — Z87442 Personal history of urinary calculi: Secondary | ICD-10-CM

## 2018-06-26 DIAGNOSIS — E7800 Pure hypercholesterolemia, unspecified: Secondary | ICD-10-CM

## 2018-06-26 DIAGNOSIS — N400 Enlarged prostate without lower urinary tract symptoms: Secondary | ICD-10-CM

## 2018-06-26 DIAGNOSIS — R197 Diarrhea, unspecified: Secondary | ICD-10-CM

## 2018-06-26 DIAGNOSIS — Z5112 Encounter for antineoplastic immunotherapy: Secondary | ICD-10-CM

## 2018-06-26 DIAGNOSIS — Z79899 Other long term (current) drug therapy: Secondary | ICD-10-CM

## 2018-06-26 DIAGNOSIS — C3411 Malignant neoplasm of upper lobe, right bronchus or lung: Secondary | ICD-10-CM

## 2018-06-26 DIAGNOSIS — R5382 Chronic fatigue, unspecified: Secondary | ICD-10-CM

## 2018-06-26 DIAGNOSIS — J449 Chronic obstructive pulmonary disease, unspecified: Secondary | ICD-10-CM

## 2018-06-26 DIAGNOSIS — R5383 Other fatigue: Secondary | ICD-10-CM

## 2018-06-26 LAB — CBC WITH DIFFERENTIAL (CANCER CENTER ONLY)
Abs Immature Granulocytes: 0.01 10*3/uL (ref 0.00–0.07)
Basophils Absolute: 0 10*3/uL (ref 0.0–0.1)
Basophils Relative: 1 %
EOS PCT: 2 %
Eosinophils Absolute: 0.1 10*3/uL (ref 0.0–0.5)
HEMATOCRIT: 46.1 % (ref 39.0–52.0)
HEMOGLOBIN: 15.4 g/dL (ref 13.0–17.0)
Immature Granulocytes: 0 %
Lymphocytes Relative: 36 %
Lymphs Abs: 1.8 10*3/uL (ref 0.7–4.0)
MCH: 28.4 pg (ref 26.0–34.0)
MCHC: 33.4 g/dL (ref 30.0–36.0)
MCV: 84.9 fL (ref 80.0–100.0)
Monocytes Absolute: 0.5 10*3/uL (ref 0.1–1.0)
Monocytes Relative: 10 %
Neutro Abs: 2.6 10*3/uL (ref 1.7–7.7)
Neutrophils Relative %: 51 %
Platelet Count: 155 10*3/uL (ref 150–400)
RBC: 5.43 MIL/uL (ref 4.22–5.81)
RDW: 14.3 % (ref 11.5–15.5)
WBC Count: 5.2 10*3/uL (ref 4.0–10.5)
nRBC: 0 % (ref 0.0–0.2)

## 2018-06-26 LAB — CMP (CANCER CENTER ONLY)
ALBUMIN: 3.9 g/dL (ref 3.5–5.0)
ALK PHOS: 86 U/L (ref 38–126)
ALT: 35 U/L (ref 0–44)
AST: 31 U/L (ref 15–41)
Anion gap: 9 (ref 5–15)
BUN: 12 mg/dL (ref 8–23)
CO2: 27 mmol/L (ref 22–32)
Calcium: 9.4 mg/dL (ref 8.9–10.3)
Chloride: 104 mmol/L (ref 98–111)
Creatinine: 1 mg/dL (ref 0.61–1.24)
GFR, Est AFR Am: >60 mL/min (ref >60)
GFR, Estimated: >60 mL/min (ref >60)
Glucose, Bld: 140 mg/dL — ABNORMAL HIGH (ref 70–99)
Potassium: 3.7 mmol/L (ref 3.5–5.1)
Sodium: 140 mmol/L (ref 135–145)
Total Bilirubin: 1.2 mg/dL (ref 0.3–1.2)
Total Protein: 7.4 g/dL (ref 6.5–8.1)

## 2018-06-26 LAB — TSH: TSH: 2.14 u[IU]/mL (ref 0.320–4.118)

## 2018-06-26 MED ORDER — SODIUM CHLORIDE 0.9 % IV SOLN
Freq: Once | INTRAVENOUS | Status: AC
  Administered 2018-06-26: 13:00:00 via INTRAVENOUS
  Filled 2018-06-26: qty 250

## 2018-06-26 MED ORDER — SODIUM CHLORIDE 0.9 % IV SOLN
200.0000 mg | Freq: Once | INTRAVENOUS | Status: AC
  Administered 2018-06-26: 200 mg via INTRAVENOUS
  Filled 2018-06-26: qty 8

## 2018-06-26 NOTE — Patient Instructions (Signed)
[facility] Discharge Instructions for Patients Receiving Chemotherapy  Today you received the following chemotherapy agents:  Keytruda.  To help prevent nausea and vomiting after your treatment, we encourage you to take your nausea medication as directed.   If you develop nausea and vomiting that is not controlled by your nausea medication, call the clinic.   BELOW ARE SYMPTOMS THAT SHOULD BE REPORTED IMMEDIATELY:  *FEVER GREATER THAN 100.5 F  *CHILLS WITH OR WITHOUT FEVER  NAUSEA AND VOMITING THAT IS NOT CONTROLLED WITH YOUR NAUSEA MEDICATION  *UNUSUAL SHORTNESS OF BREATH  *UNUSUAL BRUISING OR BLEEDING  TENDERNESS IN MOUTH AND THROAT WITH OR WITHOUT PRESENCE OF ULCERS  *URINARY PROBLEMS  *BOWEL PROBLEMS  UNUSUAL RASH Items with * indicate a potential emergency and should be followed up as soon as possible.  Feel free to call the clinic should you have any questions or concerns. The clinic phone number is [phone].  Please show the CHEMO ALERT CARD at check-in to the Emergency Department and triage nurse.    

## 2018-06-26 NOTE — Progress Notes (Signed)
[facility] [phone]   Fax:(336) [phone]  OFFICE PROGRESS NOTE  Sheryle Hail, PA-C [address] [place] [address]  DIAGNOSIS: Stage IV (T2a,N3,M1c)non-small cell lung cancer, squamous cell carcinoma presented with right upper lobe lung mass in addition to right hilar and mediastinal adenopathy as well as metastatic disease in the medial right thigh diagnosed in November 2018.  PRIOR THERAPY:  1) Palliative radiotherapy to the right lower extremity mass completed April 09, 2017. 2) Systemic chemotherapy with carboplatin for AUC of 5, paclitaxel 175 mg/M2 and Keytruda 200 mg IV every 3 weeks.  First dose March 12, 2017, status post 4 cycles with partial response.  CURRENT THERAPY: Maintenance treatment with single agent Keytruda 200 mg IV every 3 weeks.  First cycle June 11, 2017.  Status post 22 cycles.  INTERVAL HISTORY: Aaron Sellers 68 y.o. male returns to the clinic today for follow-up visit.  The patient is feeling fine today with no concerning complaints except for occasional shortness of breath and few episodes of diarrhea.  He denied having any chest pain, cough or hemoptysis.  He denied having any fever or chills.  He has no nausea, vomiting or constipation.  He denied having any recent weight loss or night sweats.  He is here today for evaluation before starting cycle #23 of his treatment with Keytruda.  MEDICAL HISTORY: Past Medical History:  Diagnosis Date   Arthritis    Atrial flutter (HCC)    BPH (benign prostatic hypertrophy) with urinary retention    Cancer (HCC)    Chronic kidney disease    hx of kidney stones  2011   COPD (chronic obstructive pulmonary disease) (HCC)    has been smoking since he was 22 ish--   High cholesterol    Hypertension    dx around 2011   Squamous cell carcinoma of right lung ([facility])     ALLERGIES:  is allergic to ciprofloxacin; adhesive [tape]; and codeine.  MEDICATIONS:    Current Outpatient Medications  Medication Sig Dispense Refill   acetaminophen (TYLENOL) 500 MG tablet Take 1,000 mg by mouth every 6 (six) hours as needed for moderate pain.     atorvastatin (LIPITOR) 10 MG tablet Take 10 mg by mouth every evening.      Cyanocobalamin (VITAMIN B-12 PO) Take 1 tablet by mouth daily.     diltiazem (CARDIZEM CD) 180 MG 24 hr capsule Take 1 capsule (180 mg total) by mouth daily. 90 capsule 1   famotidine (PEPCID) 10 MG tablet Take 10 mg by mouth as needed for heartburn or indigestion.     furosemide (LASIX) 40 MG tablet Take 1 tablet (40 mg total) by mouth daily as needed. 90 tablet 3   gabapentin (NEURONTIN) 300 MG capsule TAKE 1 CAPSULE BY MOUTH THREE TIMES DAILY 90 capsule 0   loratadine (CLARITIN) 10 MG tablet Take 10 mg by mouth daily.     tamsulosin (FLOMAX) 0.4 MG CAPS capsule Take 0.4 mg by mouth daily.     No current facility-administered medications for this visit.    Facility-Administered Medications Ordered in Other Visits  Medication Dose Route Frequency Provider Last Rate Last Dose   heparin lock flush 100 unit/mL  500 Units Intracatheter Once PRN Curt Bears, MD       sodium chloride flush (NS) 0.9 % injection 10 mL  10 mL Intracatheter PRN Curt Bears, MD        SURGICAL HISTORY:  Past Surgical History:  Procedure Laterality Date   HERNIA REPAIR     umbilical hernia  09/7207   INCISION AND DRAINAGE ABSCESS     "down the middle" of his buttocks  2015   TOTAL KNEE ARTHROPLASTY Left 10/29/2014   Procedure: TOTAL KNEE ARTHROPLASTY;  Surgeon: Dorna Leitz, MD;  Location: [facility];  Service: Orthopedics;  Laterality: Left;    REVIEW OF SYSTEMS:  A comprehensive review of systems was negative except for: Respiratory: positive for dyspnea on exertion Gastrointestinal: positive for diarrhea   PHYSICAL EXAMINATION: General appearance: alert, cooperative, fatigued and no distress Head: Normocephalic, without obvious  abnormality, atraumatic Neck: no adenopathy, no JVD, supple, symmetrical, trachea midline and thyroid not enlarged, symmetric, no tenderness/mass/nodules Lymph nodes: Cervical, supraclavicular, and axillary nodes normal. Resp: clear to auscultation bilaterally Back: symmetric, no curvature. ROM normal. No CVA tenderness. Cardio: regular rate and rhythm, S1, S2 normal, no murmur, click, rub or gallop GI: soft, non-tender; bowel sounds normal; no masses,  no organomegaly Extremities: extremities normal, atraumatic, no cyanosis or edema  ECOG PERFORMANCE STATUS: 1 - Symptomatic but completely ambulatory  Blood pressure 125/76, pulse 60, temperature (!) 97.5 F (36.4 C), temperature source Oral, resp. rate 18, height 6\' 8"  (2.032 m), weight 284 lb 12.8 oz (129.2 kg), SpO2 97 %.  LABORATORY DATA: Lab Results  Component Value Date   WBC 5.2 06/26/2018   HGB 15.4 06/26/2018   HCT 46.1 06/26/2018   MCV 84.9 06/26/2018   PLT 155 06/26/2018      Chemistry      Component Value Date/Time   NA 138 06/03/2018 1024   NA 137 04/03/2017 1030   K 3.6 06/03/2018 1024   K 4.1 04/03/2017 1030   CL 104 06/03/2018 1024   CO2 24 06/03/2018 1024   CO2 27 04/03/2017 1030   BUN 12 06/03/2018 1024   BUN 15.5 04/03/2017 1030   CREATININE 0.97 06/03/2018 1024   CREATININE 0.8 04/03/2017 1030      Component Value Date/Time   CALCIUM 8.9 06/03/2018 1024   CALCIUM 9.4 04/03/2017 1030   ALKPHOS 93 06/03/2018 1024   ALKPHOS 97 04/03/2017 1030   AST 34 06/03/2018 1024   AST 14 04/03/2017 1030   ALT 35 06/03/2018 1024   ALT 21 04/03/2017 1030   BILITOT 1.1 06/03/2018 1024   BILITOT 0.67 04/03/2017 1030       RADIOGRAPHIC STUDIES: Ct Chest W Contrast  Result Date: 05/31/2018 CLINICAL DATA:  Lung cancer. Status post immunotherapy and chemotherapy. EXAM: CT CHEST, ABDOMEN, AND PELVIS WITH CONTRAST TECHNIQUE: Multidetector CT imaging of the chest, abdomen and pelvis was performed following the  standard protocol during bolus administration of intravenous contrast. CONTRAST:  125mL OMNIPAQUE IOHEXOL 300 MG/ML  SOLN COMPARISON:  03/31/2018 FINDINGS: CT CHEST FINDINGS Cardiovascular: Heart size appears normal. Aortic atherosclerosis. Calcification in the LAD coronary artery identified. Mediastinum/Nodes: Posterior right lobe of thyroid gland nodule measures 1.5 cm, unchanged. The trachea appears patent and is midline. Normal appearance of the esophagus. Prominent thoracic lymph nodes are again identified: -index left axillary lymph node measures 1.3 cm, image 20/2. Previously 1.7 cm. -the index subcarinal lymph node measures 1.3 cm, image 32/2. Previously same. -index right hilar node measures 1.1 cm, image 31/2. Stable no new or progressive adenopathy within the chest. Lungs/Pleura: No pleural effusion. No airspace consolidation, atelectasis or pneumothorax identified. Small pulmonary nodules are again identified. -the index nodule within the lateral right upper lobe with spiculated margins measures 1.5 cm, image 50/4. Previously 1.7 cm. -  index 4 mm anterior right upper lobe lung nodule is noted, image 54/4. Stable. -index nodule within the medial left apex measures 6 mm, image 39/4. Previously 7 mm. -index left upper lobe lung nodule measures 5 mm, image 77/4. Stable. -no new or enlarging nodules identified. Musculoskeletal: No chest wall mass or suspicious bone lesions identified. CT ABDOMEN PELVIS FINDINGS Hepatobiliary: Hypertrophy of the lateral segment of left lobe of liver is again noted. Mild liver surface irregularity is also noted. Findings raise the possibility of early cirrhosis. No focal liver abnormality identified. Gallbladder is normal. No biliary dilatation. Pancreas: Unremarkable. No pancreatic ductal dilatation or surrounding inflammatory changes. Spleen: Normal in size without focal abnormality. Adrenals/Urinary Tract: Previously characterized right adrenal gland adenoma measuring 3.1 cm  appears stable, image 65/2. unremarkable appearance of the left adrenal gland. Stable right kidney cysts. No enhancing mass or hydronephrosis identified. The urinary bladder appears normal. Stomach/Bowel: Stomach is normal. The small bowel loops have a normal course and caliber. The appendix is visualized and appears normal. Diffuse colonic diverticulosis identified without acute inflammation. Vascular/Lymphatic: Aortic atherosclerosis. Infrarenal abdominal aortic aneurysm is unchanged measuring 3 cm. The portal vein and hepatic veins appear patent. 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 Mild upper abdominal adenopathy: -peripancreatic lymph node measures 1.7 cm, image 69/2. Previously 1.9 cm. -Portacaval node measures 1.7 cm, image 74/2.  Previously 2.0 cm. No pelvic or inguinal adenopathy. Reproductive: Prostate is unremarkable. Other: No free fluid or fluid collections. Musculoskeletal: No aggressive lytic or sclerotic bone lesions identified. Degenerative disc disease identified at L5-S1. IMPRESSION: 1. Stable exam. No new or progressive disease identified within the chest, abdomen and pelvis. 2. Stable appearance of pulmonary nodules including right upper lobe primary pulmonary lesion. 3. Stable to improved mild thoracic and upper abdominal adenopathy. 4. Stable morphologic features of liver suggestive of cirrhosis. There is a left abdominal splenorenal varix which may reflect sequelae of portal venous hypertension. 5. Diffuse bronchial wall thickening with emphysema, as above; imaging findings suggestive of underlying COPD. 6. Aortic atherosclerosis and coronary artery calcification 7. Stable infrarenal abdominal aortic aneurysm measuring 3 cm. Aortic Atherosclerosis (ICD10-I70.0) and Emphysema (ICD10-J43.9). Aortic aneurysm NOS (ICD10-I71.9). Electronically Signed   By: Kerby Moors M.D.   On: 05/31/2018 14:08   Ct Abdomen Pelvis W Contrast  Result Date: 05/31/2018 CLINICAL DATA:  Lung cancer.  Status post immunotherapy and chemotherapy. EXAM: CT CHEST, ABDOMEN, AND PELVIS WITH CONTRAST TECHNIQUE: Multidetector CT imaging of the chest, abdomen and pelvis was performed following the standard protocol during bolus administration of intravenous contrast. CONTRAST:  118mL OMNIPAQUE IOHEXOL 300 MG/ML  SOLN COMPARISON:  03/31/2018 FINDINGS: CT CHEST FINDINGS Cardiovascular: Heart size appears normal. Aortic atherosclerosis. Calcification in the LAD coronary artery identified. Mediastinum/Nodes: Posterior right lobe of thyroid gland nodule measures 1.5 cm, unchanged. The trachea appears patent and is midline. Normal appearance of the esophagus. Prominent thoracic lymph nodes are again identified: -index left axillary lymph node measures 1.3 cm, image 20/2. Previously 1.7 cm. -the index subcarinal lymph node measures 1.3 cm, image 32/2. Previously same. -index right hilar node measures 1.1 cm, image 31/2. Stable no new or progressive adenopathy within the chest. Lungs/Pleura: No pleural effusion. No airspace consolidation, atelectasis or pneumothorax identified. Small pulmonary nodules are again identified. -the index nodule within the lateral right upper lobe with spiculated margins measures 1.5 cm, image 50/4. Previously 1.7 cm. -index 4 mm anterior right upper lobe lung nodule is noted, image 54/4. Stable. -index nodule within the medial left apex measures 6  mm, image 39/4. Previously 7 mm. -index left upper lobe lung nodule measures 5 mm, image 77/4. Stable. -no new or enlarging nodules identified. Musculoskeletal: No chest wall mass or suspicious bone lesions identified. CT ABDOMEN PELVIS FINDINGS Hepatobiliary: Hypertrophy of the lateral segment of left lobe of liver is again noted. Mild liver surface irregularity is also noted. Findings raise the possibility of early cirrhosis. No focal liver abnormality identified. Gallbladder is normal. No biliary dilatation. Pancreas: Unremarkable. No pancreatic ductal  dilatation or surrounding inflammatory changes. Spleen: Normal in size without focal abnormality. Adrenals/Urinary Tract: Previously characterized right adrenal gland adenoma measuring 3.1 cm appears stable, image 65/2. unremarkable appearance of the left adrenal gland. Stable right kidney cysts. No enhancing mass or hydronephrosis identified. The urinary bladder appears normal. Stomach/Bowel: Stomach is normal. The small bowel loops have a normal course and caliber. The appendix is visualized and appears normal. Diffuse colonic diverticulosis identified without acute inflammation. Vascular/Lymphatic: Aortic atherosclerosis. Infrarenal abdominal aortic aneurysm is unchanged measuring 3 cm. The portal vein and hepatic veins appear patent. 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 Mild upper abdominal adenopathy: -peripancreatic lymph node measures 1.7 cm, image 69/2. Previously 1.9 cm. -Portacaval node measures 1.7 cm, image 74/2.  Previously 2.0 cm. No pelvic or inguinal adenopathy. Reproductive: Prostate is unremarkable. Other: No free fluid or fluid collections. Musculoskeletal: No aggressive lytic or sclerotic bone lesions identified. Degenerative disc disease identified at L5-S1. IMPRESSION: 1. Stable exam. No new or progressive disease identified within the chest, abdomen and pelvis. 2. Stable appearance of pulmonary nodules including right upper lobe primary pulmonary lesion. 3. Stable to improved mild thoracic and upper abdominal adenopathy. 4. Stable morphologic features of liver suggestive of cirrhosis. There is a left abdominal splenorenal varix which may reflect sequelae of portal venous hypertension. 5. Diffuse bronchial wall thickening with emphysema, as above; imaging findings suggestive of underlying COPD. 6. Aortic atherosclerosis and coronary artery calcification 7. Stable infrarenal abdominal aortic aneurysm measuring 3 cm. Aortic Atherosclerosis (ICD10-I70.0) and Emphysema (ICD10-J43.9).  Aortic aneurysm NOS (ICD10-I71.9). Electronically Signed   By: Kerby Moors M.D.   On: 05/31/2018 14:08    ASSESSMENT AND PLAN: This is a very pleasant 69 years old white male with a stage IV non-small cell lung cancer, squamous cell carcinoma.  The patient is currently undergoing systemic chemotherapy with carboplatin, paclitaxel and Keytruda status post 4 cycles with partial response. He is currently undergoing maintenance treatment with single agent Keytruda status post 22 cycles. The patient has been tolerating this treatment well with no concerning adverse effects. I recommended for him to proceed with cycle #23 today as scheduled. I will see the patient back for follow-up visit in 3 weeks for evaluation before starting cycle #24. He was advised to call immediately if he has any concerning symptoms in the interval. The patient voices understanding of current disease status and treatment options and is in agreement with the current care plan. All questions were answered. The patient knows to call the clinic with any problems, questions or concerns. We can certainly see the patient much sooner if necessary.  Disclaimer: This note was dictated with voice recognition software. Similar sounding words can inadvertently be transcribed and may not be corrected upon review.

## 2018-07-02 ENCOUNTER — Encounter: Payer: Self-pay | Admitting: General Practice

## 2018-07-02 NOTE — Progress Notes (Signed)
[facility] Team contacted patient to assess for food insecurity and other psychosocial needs during current COVID19 pandemic.    Patient/family expressed no needs at this time.  Support Team member encouraged patient to call if changes occur or they have any other questions/concerns.   Beverely Pace, [place]

## 2018-07-06 ENCOUNTER — Other Ambulatory Visit: Payer: Self-pay | Admitting: Internal Medicine

## 2018-07-06 DIAGNOSIS — G6289 Other specified polyneuropathies: Secondary | ICD-10-CM

## 2018-07-15 ENCOUNTER — Inpatient Hospital Stay: Payer: Medicare Other

## 2018-07-15 ENCOUNTER — Other Ambulatory Visit: Payer: Self-pay

## 2018-07-15 ENCOUNTER — Encounter: Payer: Self-pay | Admitting: Internal Medicine

## 2018-07-15 ENCOUNTER — Inpatient Hospital Stay: Payer: Medicare Other | Attending: Oncology

## 2018-07-15 ENCOUNTER — Inpatient Hospital Stay (HOSPITAL_BASED_OUTPATIENT_CLINIC_OR_DEPARTMENT_OTHER): Payer: Medicare Other | Admitting: Internal Medicine

## 2018-07-15 VITALS — BP 150/79 | HR 59 | Temp 97.5°F | Resp 17 | Ht >= 80 in | Wt 284.5 lb

## 2018-07-15 DIAGNOSIS — J449 Chronic obstructive pulmonary disease, unspecified: Secondary | ICD-10-CM | POA: Diagnosis not present

## 2018-07-15 DIAGNOSIS — C3411 Malignant neoplasm of upper lobe, right bronchus or lung: Secondary | ICD-10-CM | POA: Insufficient documentation

## 2018-07-15 DIAGNOSIS — Z9221 Personal history of antineoplastic chemotherapy: Secondary | ICD-10-CM

## 2018-07-15 DIAGNOSIS — Z923 Personal history of irradiation: Secondary | ICD-10-CM | POA: Diagnosis not present

## 2018-07-15 DIAGNOSIS — C781 Secondary malignant neoplasm of mediastinum: Secondary | ICD-10-CM | POA: Diagnosis not present

## 2018-07-15 DIAGNOSIS — Z87891 Personal history of nicotine dependence: Secondary | ICD-10-CM | POA: Diagnosis not present

## 2018-07-15 DIAGNOSIS — Z5112 Encounter for antineoplastic immunotherapy: Secondary | ICD-10-CM | POA: Diagnosis present

## 2018-07-15 DIAGNOSIS — Z79899 Other long term (current) drug therapy: Secondary | ICD-10-CM | POA: Insufficient documentation

## 2018-07-15 DIAGNOSIS — I10 Essential (primary) hypertension: Secondary | ICD-10-CM | POA: Diagnosis not present

## 2018-07-15 DIAGNOSIS — I4892 Unspecified atrial flutter: Secondary | ICD-10-CM | POA: Insufficient documentation

## 2018-07-15 DIAGNOSIS — C3491 Malignant neoplasm of unspecified part of right bronchus or lung: Secondary | ICD-10-CM

## 2018-07-15 DIAGNOSIS — R5382 Chronic fatigue, unspecified: Secondary | ICD-10-CM

## 2018-07-15 DIAGNOSIS — N189 Chronic kidney disease, unspecified: Secondary | ICD-10-CM | POA: Diagnosis not present

## 2018-07-15 LAB — CBC WITH DIFFERENTIAL (CANCER CENTER ONLY)
Abs Immature Granulocytes: 0 10*3/uL (ref 0.00–0.07)
Basophils Absolute: 0.1 10*3/uL (ref 0.0–0.1)
Basophils Relative: 1 %
Eosinophils Absolute: 0.2 10*3/uL (ref 0.0–0.5)
Eosinophils Relative: 3 %
HCT: 45.1 % (ref 39.0–52.0)
Hemoglobin: 14.8 g/dL (ref 13.0–17.0)
Immature Granulocytes: 0 %
Lymphocytes Relative: 35 %
Lymphs Abs: 1.9 10*3/uL (ref 0.7–4.0)
MCH: 28.4 pg (ref 26.0–34.0)
MCHC: 32.8 g/dL (ref 30.0–36.0)
MCV: 86.6 fL (ref 80.0–100.0)
Monocytes Absolute: 0.6 10*3/uL (ref 0.1–1.0)
Monocytes Relative: 11 %
Neutro Abs: 2.7 10*3/uL (ref 1.7–7.7)
Neutrophils Relative %: 50 %
Platelet Count: 155 10*3/uL (ref 150–400)
RBC: 5.21 MIL/uL (ref 4.22–5.81)
RDW: 14.4 % (ref 11.5–15.5)
WBC Count: 5.3 10*3/uL (ref 4.0–10.5)
nRBC: 0 % (ref 0.0–0.2)

## 2018-07-15 LAB — CMP (CANCER CENTER ONLY)
ALT: 28 U/L (ref 0–44)
AST: 27 U/L (ref 15–41)
Albumin: 3.8 g/dL (ref 3.5–5.0)
Alkaline Phosphatase: 89 U/L (ref 38–126)
Anion gap: 11 (ref 5–15)
BUN: 14 mg/dL (ref 8–23)
CO2: 23 mmol/L (ref 22–32)
Calcium: 8.8 mg/dL — ABNORMAL LOW (ref 8.9–10.3)
Chloride: 106 mmol/L (ref 98–111)
Creatinine: 0.88 mg/dL (ref 0.61–1.24)
GFR, Est AFR Am: >60 mL/min (ref >60)
GFR, Estimated: >60 mL/min (ref >60)
Glucose, Bld: 96 mg/dL (ref 70–99)
Potassium: 3.9 mmol/L (ref 3.5–5.1)
Sodium: 140 mmol/L (ref 135–145)
Total Bilirubin: 0.8 mg/dL (ref 0.3–1.2)
Total Protein: 7.3 g/dL (ref 6.5–8.1)

## 2018-07-15 LAB — TSH: TSH: 2.308 u[IU]/mL (ref 0.320–4.118)

## 2018-07-15 MED ORDER — SODIUM CHLORIDE 0.9 % IV SOLN
Freq: Once | INTRAVENOUS | Status: AC
  Administered 2018-07-15: 12:00:00 via INTRAVENOUS
  Filled 2018-07-15: qty 250

## 2018-07-15 MED ORDER — SODIUM CHLORIDE 0.9 % IV SOLN
200.0000 mg | Freq: Once | INTRAVENOUS | Status: AC
  Administered 2018-07-15: 200 mg via INTRAVENOUS
  Filled 2018-07-15: qty 8

## 2018-07-15 NOTE — Patient Instructions (Signed)
[facility] Discharge Instructions for Patients Receiving Chemotherapy  Today you received the following chemotherapy agents:  Keytruda.  To help prevent nausea and vomiting after your treatment, we encourage you to take your nausea medication as directed.   If you develop nausea and vomiting that is not controlled by your nausea medication, call the clinic.   BELOW ARE SYMPTOMS THAT SHOULD BE REPORTED IMMEDIATELY:  *FEVER GREATER THAN 100.5 F  *CHILLS WITH OR WITHOUT FEVER  NAUSEA AND VOMITING THAT IS NOT CONTROLLED WITH YOUR NAUSEA MEDICATION  *UNUSUAL SHORTNESS OF BREATH  *UNUSUAL BRUISING OR BLEEDING  TENDERNESS IN MOUTH AND THROAT WITH OR WITHOUT PRESENCE OF ULCERS  *URINARY PROBLEMS  *BOWEL PROBLEMS  UNUSUAL RASH Items with * indicate a potential emergency and should be followed up as soon as possible.  Feel free to call the clinic should you have any questions or concerns. The clinic phone number is [phone].  Please show the CHEMO ALERT CARD at check-in to the Emergency Department and triage nurse.    

## 2018-07-15 NOTE — Progress Notes (Signed)
[facility] Telephone:(336) [phone]   Fax:(336) [phone]  OFFICE PROGRESS NOTE  Sheryle Hail, PA-C [address] [place] [address]  DIAGNOSIS: Stage IV (T2a,N3,M1c)non-small cell lung cancer, squamous cell carcinoma presented with right upper lobe lung mass in addition to right hilar and mediastinal adenopathy as well as metastatic disease in the medial right thigh diagnosed in November 2018.  PRIOR THERAPY:  1) Palliative radiotherapy to the right lower extremity mass completed April 09, 2017. 2) Systemic chemotherapy with carboplatin for AUC of 5, paclitaxel 175 mg/M2 and Keytruda 200 mg IV every 3 weeks.  First dose March 12, 2017, status post 4 cycles with partial response.  CURRENT THERAPY: Maintenance treatment with single agent Keytruda 200 mg IV every 3 weeks.  First cycle June 11, 2017.  Status post 22 cycles.  INTERVAL HISTORY: Aaron Sellers 68 y.o. male returns to the clinic today for follow-up visit.  The patient is feeling fine today with no concerning complaints.  He denied having any chest pain, shortness of breath, cough or hemoptysis.  He denied having any fever or chills.  He continues to have mild low back pain.  The patient has no nausea, vomiting, diarrhea or constipation.  He continues to tolerate his treatment with Keytruda fairly well.  He is here today for evaluation before starting cycle #23.  MEDICAL HISTORY: Past Medical History:  Diagnosis Date  . Arthritis   . Atrial flutter ([facility])   . BPH (benign prostatic hypertrophy) with urinary retention   . Cancer ([facility])   . Chronic kidney disease    hx of kidney stones  2011  . COPD (chronic obstructive pulmonary disease) ([facility])    has been smoking since he was 22 ish--  . High cholesterol   . Hypertension    dx around 2011  . Squamous cell carcinoma of right lung ([facility])     ALLERGIES:  is allergic to ciprofloxacin; adhesive [tape]; and codeine.  MEDICATIONS:  Current  Outpatient Medications  Medication Sig Dispense Refill  . acetaminophen (TYLENOL) 500 MG tablet Take 1,000 mg by mouth every 6 (six) hours as needed for moderate pain.    Marland Kitchen atorvastatin (LIPITOR) 10 MG tablet Take 10 mg by mouth every evening.     . Cyanocobalamin (VITAMIN B-12 PO) Take 1 tablet by mouth daily.    Marland Kitchen diltiazem (CARDIZEM CD) 180 MG 24 hr capsule Take 1 capsule (180 mg total) by mouth daily. 90 capsule 1  . famotidine (PEPCID) 10 MG tablet Take 10 mg by mouth as needed for heartburn or indigestion.    . furosemide (LASIX) 40 MG tablet Take 1 tablet (40 mg total) by mouth daily as needed. 90 tablet 3  . gabapentin (NEURONTIN) 300 MG capsule TAKE 1 CAPSULE BY MOUTH THREE TIMES DAILY 90 capsule 0  . loratadine (CLARITIN) 10 MG tablet Take 10 mg by mouth daily.    . tamsulosin (FLOMAX) 0.4 MG CAPS capsule Take 0.4 mg by mouth daily.     No current facility-administered medications for this visit.    Facility-Administered Medications Ordered in Other Visits  Medication Dose Route Frequency Provider Last Rate Last Dose  . heparin lock flush 100 unit/mL  500 Units Intracatheter Once PRN Curt Bears, MD      . sodium chloride flush (NS) 0.9 % injection 10 mL  10 mL Intracatheter PRN Curt Bears, MD        SURGICAL HISTORY:  Past Surgical History:  Procedure Laterality Date  .  HERNIA REPAIR     umbilical hernia  0/2774  . INCISION AND DRAINAGE ABSCESS     "down the middle" of his buttocks  2015  . TOTAL KNEE ARTHROPLASTY Left 10/29/2014   Procedure: TOTAL KNEE ARTHROPLASTY;  Surgeon: Dorna Leitz, MD;  Location: [facility];  Service: Orthopedics;  Laterality: Left;    REVIEW OF SYSTEMS:  A comprehensive review of systems was negative except for: Musculoskeletal: positive for back pain   PHYSICAL EXAMINATION: General appearance: alert, cooperative and no distress Head: Normocephalic, without obvious abnormality, atraumatic Neck: no adenopathy, no JVD, supple, symmetrical,  trachea midline and thyroid not enlarged, symmetric, no tenderness/mass/nodules Lymph nodes: Cervical, supraclavicular, and axillary nodes normal. Resp: clear to auscultation bilaterally Back: symmetric, no curvature. ROM normal. No CVA tenderness. Cardio: regular rate and rhythm, S1, S2 normal, no murmur, click, rub or gallop GI: soft, non-tender; bowel sounds normal; no masses,  no organomegaly Extremities: extremities normal, atraumatic, no cyanosis or edema  ECOG PERFORMANCE STATUS: 1 - Symptomatic but completely ambulatory  Blood pressure (!) 150/79, pulse (!) 59, temperature (!) 97.5 F (36.4 C), temperature source Oral, resp. rate 17, height 6\' 8"  (2.032 m), weight 284 lb 8 oz (129 kg), SpO2 98 %.  LABORATORY DATA: Lab Results  Component Value Date   WBC 5.3 07/15/2018   HGB 14.8 07/15/2018   HCT 45.1 07/15/2018   MCV 86.6 07/15/2018   PLT 155 07/15/2018      Chemistry      Component Value Date/Time   NA 140 06/26/2018 1120   NA 137 04/03/2017 1030   K 3.7 06/26/2018 1120   K 4.1 04/03/2017 1030   CL 104 06/26/2018 1120   CO2 27 06/26/2018 1120   CO2 27 04/03/2017 1030   BUN 12 06/26/2018 1120   BUN 15.5 04/03/2017 1030   CREATININE 1.00 06/26/2018 1120   CREATININE 0.8 04/03/2017 1030      Component Value Date/Time   CALCIUM 9.4 06/26/2018 1120   CALCIUM 9.4 04/03/2017 1030   ALKPHOS 86 06/26/2018 1120   ALKPHOS 97 04/03/2017 1030   AST 31 06/26/2018 1120   AST 14 04/03/2017 1030   ALT 35 06/26/2018 1120   ALT 21 04/03/2017 1030   BILITOT 1.2 06/26/2018 1120   BILITOT 0.67 04/03/2017 1030       RADIOGRAPHIC STUDIES: No results found.  ASSESSMENT AND PLAN: This is a very pleasant 68 years old white male with a stage IV non-small cell lung cancer, squamous cell carcinoma.  The patient is currently undergoing systemic chemotherapy with carboplatin, paclitaxel and Keytruda status post 4 cycles with partial response. He is currently undergoing maintenance  treatment with single agent Keytruda status post 23 cycles. He continues to tolerate his treatment well with no concerning adverse effects. I recommended for him to proceed with cycle #24 today as scheduled. I will see him back for follow-up visit in 3 weeks for evaluation before the next cycle of his treatment. He was advised to call immediately if he has any concerning symptoms in the interval. The patient voices understanding of current disease status and treatment options and is in agreement with the current care plan. All questions were answered. The patient knows to call the clinic with any problems, questions or concerns. We can certainly see the patient much sooner if necessary.  Disclaimer: This note was dictated with voice recognition software. Similar sounding words can inadvertently be transcribed and may not be corrected upon review.

## 2018-08-05 ENCOUNTER — Other Ambulatory Visit: Payer: Self-pay

## 2018-08-05 ENCOUNTER — Encounter: Payer: Self-pay | Admitting: Internal Medicine

## 2018-08-05 ENCOUNTER — Inpatient Hospital Stay: Payer: Medicare Other

## 2018-08-05 ENCOUNTER — Telehealth: Payer: Self-pay | Admitting: Internal Medicine

## 2018-08-05 ENCOUNTER — Inpatient Hospital Stay: Payer: Medicare Other | Attending: Oncology

## 2018-08-05 ENCOUNTER — Inpatient Hospital Stay (HOSPITAL_BASED_OUTPATIENT_CLINIC_OR_DEPARTMENT_OTHER): Payer: Medicare Other | Admitting: Internal Medicine

## 2018-08-05 VITALS — BP 127/74 | HR 61 | Temp 97.8°F | Resp 18 | Ht >= 80 in | Wt 288.3 lb

## 2018-08-05 DIAGNOSIS — C7951 Secondary malignant neoplasm of bone: Secondary | ICD-10-CM | POA: Insufficient documentation

## 2018-08-05 DIAGNOSIS — Z9221 Personal history of antineoplastic chemotherapy: Secondary | ICD-10-CM | POA: Diagnosis not present

## 2018-08-05 DIAGNOSIS — C3411 Malignant neoplasm of upper lobe, right bronchus or lung: Secondary | ICD-10-CM | POA: Insufficient documentation

## 2018-08-05 DIAGNOSIS — F17210 Nicotine dependence, cigarettes, uncomplicated: Secondary | ICD-10-CM | POA: Diagnosis not present

## 2018-08-05 DIAGNOSIS — M1990 Unspecified osteoarthritis, unspecified site: Secondary | ICD-10-CM | POA: Diagnosis not present

## 2018-08-05 DIAGNOSIS — R5382 Chronic fatigue, unspecified: Secondary | ICD-10-CM

## 2018-08-05 DIAGNOSIS — C781 Secondary malignant neoplasm of mediastinum: Secondary | ICD-10-CM

## 2018-08-05 DIAGNOSIS — I4892 Unspecified atrial flutter: Secondary | ICD-10-CM | POA: Insufficient documentation

## 2018-08-05 DIAGNOSIS — N400 Enlarged prostate without lower urinary tract symptoms: Secondary | ICD-10-CM | POA: Insufficient documentation

## 2018-08-05 DIAGNOSIS — C3491 Malignant neoplasm of unspecified part of right bronchus or lung: Secondary | ICD-10-CM

## 2018-08-05 DIAGNOSIS — J449 Chronic obstructive pulmonary disease, unspecified: Secondary | ICD-10-CM | POA: Diagnosis not present

## 2018-08-05 DIAGNOSIS — E7800 Pure hypercholesterolemia, unspecified: Secondary | ICD-10-CM

## 2018-08-05 DIAGNOSIS — C3490 Malignant neoplasm of unspecified part of unspecified bronchus or lung: Secondary | ICD-10-CM

## 2018-08-05 DIAGNOSIS — N189 Chronic kidney disease, unspecified: Secondary | ICD-10-CM | POA: Diagnosis not present

## 2018-08-05 DIAGNOSIS — Z5112 Encounter for antineoplastic immunotherapy: Secondary | ICD-10-CM | POA: Insufficient documentation

## 2018-08-05 DIAGNOSIS — Z87442 Personal history of urinary calculi: Secondary | ICD-10-CM | POA: Insufficient documentation

## 2018-08-05 DIAGNOSIS — Z923 Personal history of irradiation: Secondary | ICD-10-CM | POA: Insufficient documentation

## 2018-08-05 DIAGNOSIS — Z5111 Encounter for antineoplastic chemotherapy: Secondary | ICD-10-CM | POA: Insufficient documentation

## 2018-08-05 DIAGNOSIS — Z79899 Other long term (current) drug therapy: Secondary | ICD-10-CM | POA: Insufficient documentation

## 2018-08-05 DIAGNOSIS — I129 Hypertensive chronic kidney disease with stage 1 through stage 4 chronic kidney disease, or unspecified chronic kidney disease: Secondary | ICD-10-CM | POA: Diagnosis not present

## 2018-08-05 LAB — CBC WITH DIFFERENTIAL (CANCER CENTER ONLY)
Abs Immature Granulocytes: 0.02 10*3/uL (ref 0.00–0.07)
Basophils Absolute: 0.1 10*3/uL (ref 0.0–0.1)
Basophils Relative: 1 %
Eosinophils Absolute: 0.1 10*3/uL (ref 0.0–0.5)
Eosinophils Relative: 2 %
HCT: 41.9 % (ref 39.0–52.0)
Hemoglobin: 13.8 g/dL (ref 13.0–17.0)
Immature Granulocytes: 0 %
Lymphocytes Relative: 34 %
Lymphs Abs: 2.2 10*3/uL (ref 0.7–4.0)
MCH: 28.3 pg (ref 26.0–34.0)
MCHC: 32.9 g/dL (ref 30.0–36.0)
MCV: 85.9 fL (ref 80.0–100.0)
Monocytes Absolute: 0.8 10*3/uL (ref 0.1–1.0)
Monocytes Relative: 13 %
Neutro Abs: 3.2 10*3/uL (ref 1.7–7.7)
Neutrophils Relative %: 50 %
Platelet Count: 180 10*3/uL (ref 150–400)
RBC: 4.88 MIL/uL (ref 4.22–5.81)
RDW: 14.3 % (ref 11.5–15.5)
WBC Count: 6.4 10*3/uL (ref 4.0–10.5)
nRBC: 0 % (ref 0.0–0.2)

## 2018-08-05 LAB — CMP (CANCER CENTER ONLY)
ALT: 17 U/L (ref 0–44)
AST: 20 U/L (ref 15–41)
Albumin: 3.7 g/dL (ref 3.5–5.0)
Alkaline Phosphatase: 83 U/L (ref 38–126)
Anion gap: 7 (ref 5–15)
BUN: 17 mg/dL (ref 8–23)
CO2: 27 mmol/L (ref 22–32)
Calcium: 9.1 mg/dL (ref 8.9–10.3)
Chloride: 105 mmol/L (ref 98–111)
Creatinine: 0.93 mg/dL (ref 0.61–1.24)
GFR, Est AFR Am: >60 mL/min (ref >60)
GFR, Estimated: >60 mL/min (ref >60)
Glucose, Bld: 89 mg/dL (ref 70–99)
Potassium: 3.5 mmol/L (ref 3.5–5.1)
Sodium: 139 mmol/L (ref 135–145)
Total Bilirubin: 1.2 mg/dL (ref 0.3–1.2)
Total Protein: 7 g/dL (ref 6.5–8.1)

## 2018-08-05 LAB — TSH: TSH: 2.55 u[IU]/mL (ref 0.320–4.118)

## 2018-08-05 MED ORDER — SODIUM CHLORIDE 0.9 % IV SOLN
Freq: Once | INTRAVENOUS | Status: AC
  Administered 2018-08-05: 11:00:00 via INTRAVENOUS
  Filled 2018-08-05: qty 250

## 2018-08-05 MED ORDER — SODIUM CHLORIDE 0.9 % IV SOLN
200.0000 mg | Freq: Once | INTRAVENOUS | Status: AC
  Administered 2018-08-05: 200 mg via INTRAVENOUS
  Filled 2018-08-05: qty 8

## 2018-08-05 NOTE — Progress Notes (Signed)
[place] [phone]   [phone]  OFFICE PROGRESS NOTE  Sheryle Hail, PA-C [address] [place] [address]  DIAGNOSIS: Stage IV (T2a,N3,M1c)non-small cell lung cancer, squamous cell carcinoma presented with right upper lobe lung mass in addition to right hilar and mediastinal adenopathy as well as metastatic disease in the medial right thigh diagnosed in November 2018.  PRIOR THERAPY:  1) Palliative radiotherapy to the right lower extremity mass completed April 09, 2017. 2) Systemic chemotherapy with carboplatin for AUC of 5, paclitaxel 175 mg/M2 and Keytruda 200 mg IV every 3 weeks.  First dose March 12, 2017, status post 4 cycles with partial response.  CURRENT THERAPY: Maintenance treatment with single agent Keytruda 200 mg IV every 3 weeks.  First cycle June 11, 2017.  Status post 24 cycles.  INTERVAL HISTORY: Aaron Sellers 68 y.o. male returns to the clinic today for follow-up visit.  The patient is feeling well today with no concerning complaints.  He gained few pounds since his last visit.  He denied having any chest pain, shortness of breath, cough or hemoptysis.  He denied having any fever or chills.  He has no nausea, vomiting, diarrhea or constipation.  He denied having any headache or visual changes.  He is here today for evaluation before starting cycle #25.  MEDICAL HISTORY: Past Medical History:  Diagnosis Date  . Arthritis   . Atrial flutter ([facility] [place])   . BPH (benign prostatic hypertrophy) with urinary retention   . Cancer ([facility] [facility])   . Chronic kidney disease    hx of kidney stones  2011  . COPD (chronic obstructive pulmonary disease) ([facility])    has been smoking since he was 22 ish--  . High cholesterol   . Hypertension    dx around 2011  . Squamous cell carcinoma of right lung ([facility])     ALLERGIES:  is allergic to ciprofloxacin; adhesive [tape]; and codeine.  MEDICATIONS:  Current Outpatient Medications   Medication Sig Dispense Refill  . acetaminophen (TYLENOL) 500 MG tablet Take 1,000 mg by mouth every 6 (six) hours as needed for moderate pain.    Marland Kitchen atorvastatin (LIPITOR) 10 MG tablet Take 10 mg by mouth every evening.     . Cyanocobalamin (VITAMIN B-12 PO) Take 1 tablet by mouth daily.    Marland Kitchen diltiazem (CARDIZEM CD) 180 MG 24 hr capsule Take 1 capsule (180 mg total) by mouth daily. 90 capsule 1  . famotidine (PEPCID) 10 MG tablet Take 10 mg by mouth as needed for heartburn or indigestion.    . furosemide (LASIX) 40 MG tablet Take 1 tablet (40 mg total) by mouth daily as needed. 90 tablet 3  . gabapentin (NEURONTIN) 300 MG capsule TAKE 1 CAPSULE BY MOUTH THREE TIMES DAILY 90 capsule 0  . loratadine (CLARITIN) 10 MG tablet Take 10 mg by mouth daily.    . tamsulosin (FLOMAX) 0.4 MG CAPS capsule Take 0.4 mg by mouth daily.     No current facility-administered medications for this visit.    Facility-Administered Medications Ordered in Other Visits  Medication Dose Route Frequency Provider Last Rate Last Dose  . heparin lock flush 100 unit/mL  500 Units Intracatheter Once PRN Curt Bears, MD      . sodium chloride flush (NS) 0.9 % injection 10 mL  10 mL Intracatheter PRN Curt Bears, MD        SURGICAL HISTORY:  Past Surgical History:  Procedure Laterality Date  . HERNIA  REPAIR     umbilical hernia  07/812  . INCISION AND DRAINAGE ABSCESS     "down the middle" of his buttocks  2015  . TOTAL KNEE ARTHROPLASTY Left 10/29/2014   Procedure: TOTAL KNEE ARTHROPLASTY;  Surgeon: Dorna Leitz, MD;  Location: [facility];  Service: Orthopedics;  Laterality: Left;    REVIEW OF SYSTEMS:  A comprehensive review of systems was negative.   PHYSICAL EXAMINATION: General appearance: alert, cooperative and no distress Head: Normocephalic, without obvious abnormality, atraumatic Neck: no adenopathy, no JVD, supple, symmetrical, trachea midline and thyroid not enlarged, symmetric, no  tenderness/mass/nodules Lymph nodes: Cervical, supraclavicular, and axillary nodes normal. Resp: clear to auscultation bilaterally Back: symmetric, no curvature. ROM normal. No CVA tenderness. Cardio: regular rate and rhythm, S1, S2 normal, no murmur, click, rub or gallop GI: soft, non-tender; bowel sounds normal; no masses,  no organomegaly Extremities: extremities normal, atraumatic, no cyanosis or edema  ECOG PERFORMANCE STATUS: 1 - Symptomatic but completely ambulatory  Blood pressure 127/74, pulse 61, temperature 97.8 F (36.6 C), temperature source Oral, resp. rate 18, height 6\' 8"  (2.032 m), weight 288 lb 4.8 oz (130.8 kg), SpO2 98 %.  LABORATORY DATA: Lab Results  Component Value Date   WBC 5.3 07/15/2018   HGB 14.8 07/15/2018   HCT 45.1 07/15/2018   MCV 86.6 07/15/2018   PLT 155 07/15/2018      Chemistry      Component Value Date/Time   NA 140 07/15/2018 1017   NA 137 04/03/2017 1030   K 3.9 07/15/2018 1017   K 4.1 04/03/2017 1030   CL 106 07/15/2018 1017   CO2 23 07/15/2018 1017   CO2 27 04/03/2017 1030   BUN 14 07/15/2018 1017   BUN 15.5 04/03/2017 1030   CREATININE 0.88 07/15/2018 1017   CREATININE 0.8 04/03/2017 1030      Component Value Date/Time   CALCIUM 8.8 (L) 07/15/2018 1017   CALCIUM 9.4 04/03/2017 1030   ALKPHOS 89 07/15/2018 1017   ALKPHOS 97 04/03/2017 1030   AST 27 07/15/2018 1017   AST 14 04/03/2017 1030   ALT 28 07/15/2018 1017   ALT 21 04/03/2017 1030   BILITOT 0.8 07/15/2018 1017   BILITOT 0.67 04/03/2017 1030       RADIOGRAPHIC STUDIES: No results found.  ASSESSMENT AND PLAN: This is a very pleasant 68 years old white male with a stage IV non-small cell lung cancer, squamous cell carcinoma.  The patient is currently undergoing systemic chemotherapy with carboplatin, paclitaxel and Keytruda status post 4 cycles with partial response. He is currently undergoing maintenance treatment with single agent Keytruda status post 24 cycles.  The patient continues to tolerate his treatment well with no concerning adverse effects. I recommended for him to proceed with cycle #25 today as scheduled. I will see the patient back for follow-up visit in 3 weeks for evaluation before starting cycle #26 with repeat CT scan of the chest, abdomen and pelvis for restaging of his disease. He was advised to call immediately if he has any concerning symptoms in the interval. The patient voices understanding of current disease status and treatment options and is in agreement with the current care plan. All questions were answered. The patient knows to call the clinic with any problems, questions or concerns. We can certainly see the patient much sooner if necessary.  Disclaimer: This note was dictated with voice recognition software. Similar sounding words can inadvertently be transcribed and may not be corrected upon review.

## 2018-08-05 NOTE — Telephone Encounter (Signed)
Added additional cycles per 5/5 los - pt to get an updated schedule next visit.  \

## 2018-08-05 NOTE — Patient Instructions (Signed)
[facility] Discharge Instructions for Patients Receiving Chemotherapy  Today you received the following chemotherapy agents Keytruda  To help prevent nausea and vomiting after your treatment, we encourage you to take your nausea medication: As directed by MD   If you develop nausea and vomiting that is not controlled by your nausea medication, call the clinic.   BELOW ARE SYMPTOMS THAT SHOULD BE REPORTED IMMEDIATELY:  *FEVER GREATER THAN 100.5 F  *CHILLS WITH OR WITHOUT FEVER  NAUSEA AND VOMITING THAT IS NOT CONTROLLED WITH YOUR NAUSEA MEDICATION  *UNUSUAL SHORTNESS OF BREATH  *UNUSUAL BRUISING OR BLEEDING  TENDERNESS IN MOUTH AND THROAT WITH OR WITHOUT PRESENCE OF ULCERS  *URINARY PROBLEMS  *BOWEL PROBLEMS  UNUSUAL RASH Items with * indicate a potential emergency and should be followed up as soon as possible.  Feel free to call the clinic should you have any questions or concerns. The clinic phone number is [phone].  Please show the [facility] at check-in to the Emergency Department and triage nurse.

## 2018-08-22 ENCOUNTER — Other Ambulatory Visit: Payer: Self-pay

## 2018-08-22 ENCOUNTER — Ambulatory Visit (HOSPITAL_COMMUNITY)
Admission: RE | Admit: 2018-08-22 | Discharge: 2018-08-22 | Disposition: A | Discharge status: Home / Self Care | Payer: Medicare Other | Source: Ambulatory Visit | Attending: Internal Medicine | Admitting: Internal Medicine

## 2018-08-22 DIAGNOSIS — C3490 Malignant neoplasm of unspecified part of unspecified bronchus or lung: Secondary | ICD-10-CM | POA: Diagnosis present

## 2018-08-22 MED ORDER — IOHEXOL 300 MG/ML  SOLN
100.0000 mL | Freq: Once | INTRAMUSCULAR | Status: AC | PRN
  Administered 2018-08-22: 10:00:00 100 mL via INTRAVENOUS

## 2018-08-22 MED ORDER — SODIUM CHLORIDE (PF) 0.9 % IJ SOLN
INTRAMUSCULAR | Status: AC
  Filled 2018-08-22: qty 50

## 2018-08-26 ENCOUNTER — Other Ambulatory Visit: Payer: Self-pay

## 2018-08-26 ENCOUNTER — Inpatient Hospital Stay: Payer: Medicare Other

## 2018-08-26 ENCOUNTER — Encounter: Payer: Self-pay | Admitting: Internal Medicine

## 2018-08-26 ENCOUNTER — Inpatient Hospital Stay (HOSPITAL_BASED_OUTPATIENT_CLINIC_OR_DEPARTMENT_OTHER): Payer: Medicare Other | Admitting: Internal Medicine

## 2018-08-26 VITALS — BP 135/77 | HR 64 | Temp 98.5°F | Resp 18 | Ht >= 80 in | Wt 283.3 lb

## 2018-08-26 VITALS — BP 118/77 | HR 59 | Temp 98.5°F | Resp 16

## 2018-08-26 DIAGNOSIS — C781 Secondary malignant neoplasm of mediastinum: Secondary | ICD-10-CM

## 2018-08-26 DIAGNOSIS — C3491 Malignant neoplasm of unspecified part of right bronchus or lung: Secondary | ICD-10-CM

## 2018-08-26 DIAGNOSIS — Z9221 Personal history of antineoplastic chemotherapy: Secondary | ICD-10-CM

## 2018-08-26 DIAGNOSIS — Z87442 Personal history of urinary calculi: Secondary | ICD-10-CM

## 2018-08-26 DIAGNOSIS — C7951 Secondary malignant neoplasm of bone: Secondary | ICD-10-CM | POA: Diagnosis not present

## 2018-08-26 DIAGNOSIS — Z923 Personal history of irradiation: Secondary | ICD-10-CM

## 2018-08-26 DIAGNOSIS — I4892 Unspecified atrial flutter: Secondary | ICD-10-CM

## 2018-08-26 DIAGNOSIS — E7800 Pure hypercholesterolemia, unspecified: Secondary | ICD-10-CM

## 2018-08-26 DIAGNOSIS — C3411 Malignant neoplasm of upper lobe, right bronchus or lung: Secondary | ICD-10-CM

## 2018-08-26 DIAGNOSIS — Z79899 Other long term (current) drug therapy: Secondary | ICD-10-CM

## 2018-08-26 DIAGNOSIS — Z720 Tobacco use: Secondary | ICD-10-CM

## 2018-08-26 DIAGNOSIS — J449 Chronic obstructive pulmonary disease, unspecified: Secondary | ICD-10-CM

## 2018-08-26 DIAGNOSIS — N189 Chronic kidney disease, unspecified: Secondary | ICD-10-CM

## 2018-08-26 DIAGNOSIS — N400 Enlarged prostate without lower urinary tract symptoms: Secondary | ICD-10-CM

## 2018-08-26 DIAGNOSIS — F17210 Nicotine dependence, cigarettes, uncomplicated: Secondary | ICD-10-CM

## 2018-08-26 DIAGNOSIS — R5382 Chronic fatigue, unspecified: Secondary | ICD-10-CM

## 2018-08-26 DIAGNOSIS — I129 Hypertensive chronic kidney disease with stage 1 through stage 4 chronic kidney disease, or unspecified chronic kidney disease: Secondary | ICD-10-CM

## 2018-08-26 DIAGNOSIS — Z5112 Encounter for antineoplastic immunotherapy: Secondary | ICD-10-CM | POA: Diagnosis not present

## 2018-08-26 DIAGNOSIS — M1990 Unspecified osteoarthritis, unspecified site: Secondary | ICD-10-CM

## 2018-08-26 LAB — CBC WITH DIFFERENTIAL (CANCER CENTER ONLY)
Abs Immature Granulocytes: 0.01 10*3/uL (ref 0.00–0.07)
Basophils Absolute: 0.1 10*3/uL (ref 0.0–0.1)
Basophils Relative: 1 %
Eosinophils Absolute: 0.2 10*3/uL (ref 0.0–0.5)
Eosinophils Relative: 3 %
HCT: 44.6 % (ref 39.0–52.0)
Hemoglobin: 14.6 g/dL (ref 13.0–17.0)
Immature Granulocytes: 0 %
Lymphocytes Relative: 37 %
Lymphs Abs: 2.3 10*3/uL (ref 0.7–4.0)
MCH: 28.6 pg (ref 26.0–34.0)
MCHC: 32.7 g/dL (ref 30.0–36.0)
MCV: 87.3 fL (ref 80.0–100.0)
Monocytes Absolute: 0.7 10*3/uL (ref 0.1–1.0)
Monocytes Relative: 10 %
Neutro Abs: 3.1 10*3/uL (ref 1.7–7.7)
Neutrophils Relative %: 49 %
Platelet Count: 175 10*3/uL (ref 150–400)
RBC: 5.11 MIL/uL (ref 4.22–5.81)
RDW: 13.7 % (ref 11.5–15.5)
WBC Count: 6.3 10*3/uL (ref 4.0–10.5)
nRBC: 0 % (ref 0.0–0.2)

## 2018-08-26 LAB — CMP (CANCER CENTER ONLY)
ALT: 21 U/L (ref 0–44)
AST: 19 U/L (ref 15–41)
Albumin: 3.8 g/dL (ref 3.5–5.0)
Alkaline Phosphatase: 91 U/L (ref 38–126)
Anion gap: 9 (ref 5–15)
BUN: 14 mg/dL (ref 8–23)
CO2: 27 mmol/L (ref 22–32)
Calcium: 9.4 mg/dL (ref 8.9–10.3)
Chloride: 103 mmol/L (ref 98–111)
Creatinine: 0.88 mg/dL (ref 0.61–1.24)
GFR, Est AFR Am: >60 mL/min (ref >60)
GFR, Estimated: >60 mL/min (ref >60)
Glucose, Bld: 92 mg/dL (ref 70–99)
Potassium: 4 mmol/L (ref 3.5–5.1)
Sodium: 139 mmol/L (ref 135–145)
Total Bilirubin: 0.8 mg/dL (ref 0.3–1.2)
Total Protein: 7.2 g/dL (ref 6.5–8.1)

## 2018-08-26 LAB — TSH: TSH: 3.413 u[IU]/mL (ref 0.320–4.118)

## 2018-08-26 MED ORDER — SODIUM CHLORIDE 0.9 % IV SOLN
200.0000 mg | Freq: Once | INTRAVENOUS | Status: AC
  Administered 2018-08-26: 11:00:00 200 mg via INTRAVENOUS
  Filled 2018-08-26: qty 8

## 2018-08-26 MED ORDER — SODIUM CHLORIDE 0.9 % IV SOLN
Freq: Once | INTRAVENOUS | Status: AC
  Administered 2018-08-26: 11:00:00 via INTRAVENOUS
  Filled 2018-08-26: qty 250

## 2018-08-26 NOTE — Patient Instructions (Signed)
[facility] Discharge Instructions for Patients Receiving Chemotherapy  Today you received the following chemotherapy agents:  Keytruda.  To help prevent nausea and vomiting after your treatment, we encourage you to take your nausea medication as directed.   If you develop nausea and vomiting that is not controlled by your nausea medication, call the clinic.   BELOW ARE SYMPTOMS THAT SHOULD BE REPORTED IMMEDIATELY:  *FEVER GREATER THAN 100.5 F  *CHILLS WITH OR WITHOUT FEVER  NAUSEA AND VOMITING THAT IS NOT CONTROLLED WITH YOUR NAUSEA MEDICATION  *UNUSUAL SHORTNESS OF BREATH  *UNUSUAL BRUISING OR BLEEDING  TENDERNESS IN MOUTH AND THROAT WITH OR WITHOUT PRESENCE OF ULCERS  *URINARY PROBLEMS  *BOWEL PROBLEMS  UNUSUAL RASH Items with * indicate a potential emergency and should be followed up as soon as possible.  Feel free to call the clinic should you have any questions or concerns. The clinic phone number is [phone].  Please show the CHEMO ALERT CARD at check-in to the Emergency Department and triage nurse.    

## 2018-08-26 NOTE — Progress Notes (Signed)
[place] [phone]   [phone]) [phone]  OFFICE PROGRESS NOTE  Sheryle Hail, PA-C [address] [place] [address]  DIAGNOSIS: Stage IV (T2a,N3,M1c)non-small cell lung cancer, squamous cell carcinoma presented with right upper lobe lung mass in addition to right hilar and mediastinal adenopathy as well as metastatic disease in the medial right thigh diagnosed in November 2018.  PRIOR THERAPY:  1) Palliative radiotherapy to the right lower extremity mass completed April 09, 2017. 2) Systemic chemotherapy with carboplatin for AUC of 5, paclitaxel 175 mg/M2 and Keytruda 200 mg IV every 3 weeks.  First dose March 12, 2017, status post 4 cycles with partial response.  CURRENT THERAPY: Maintenance treatment with single agent Keytruda 200 mg IV every 3 weeks.  First cycle June 11, 2017.  Status post 25 cycles.  INTERVAL HISTORY: Aaron Sellers 68 y.o. male returns to the clinic today for follow-up visit.  The patient is feeling fine today with no concerning complaints.  He continues to tolerate his treatment with Keytruda fairly well.  He denied having any chest pain, shortness of breath, cough or hemoptysis.  He denied having any fever or chills.  He has no nausea, vomiting, diarrhea or constipation.  He has no headache or visual changes.  He continues to tolerate his treatment with Keytruda fairly well.  The patient has repeat CT scan of the chest, abdomen pelvis performed recently and is here for evaluation and discussion of his scan results.  MEDICAL HISTORY: Past Medical History:  Diagnosis Date   Arthritis    Atrial flutter (HCC)    BPH (benign prostatic hypertrophy) with urinary retention    Cancer ([facility])    Chronic kidney disease    hx of kidney stones  2011   COPD (chronic obstructive pulmonary disease) ([facility])    has been smoking since he was 22 ish--   High cholesterol    Hypertension    dx around 2011   Squamous  cell carcinoma of right lung ([facility])     ALLERGIES:  is allergic to ciprofloxacin; adhesive [tape]; and codeine.  MEDICATIONS:  Current Outpatient Medications  Medication Sig Dispense Refill   acetaminophen (TYLENOL) 500 MG tablet Take 1,000 mg by mouth every 6 (six) hours as needed for moderate pain.     atorvastatin (LIPITOR) 10 MG tablet Take 10 mg by mouth every evening.      Cyanocobalamin (VITAMIN B-12 PO) Take 1 tablet by mouth daily.     diltiazem (CARDIZEM CD) 180 MG 24 hr capsule Take 1 capsule (180 mg total) by mouth daily. 90 capsule 1   famotidine (PEPCID) 10 MG tablet Take 10 mg by mouth as needed for heartburn or indigestion.     furosemide (LASIX) 40 MG tablet Take 1 tablet (40 mg total) by mouth daily as needed. 90 tablet 3   gabapentin (NEURONTIN) 300 MG capsule TAKE 1 CAPSULE BY MOUTH THREE TIMES DAILY 90 capsule 0   loratadine (CLARITIN) 10 MG tablet Take 10 mg by mouth daily.     tamsulosin (FLOMAX) 0.4 MG CAPS capsule Take 0.4 mg by mouth daily.     No current facility-administered medications for this visit.    Facility-Administered Medications Ordered in Other Visits  Medication Dose Route Frequency Provider Last Rate Last Dose   heparin lock flush 100 unit/mL  500 Units Intracatheter Once PRN Curt Bears, MD       sodium chloride flush (NS) 0.9 % injection 10 mL  10  mL Intracatheter PRN Curt Bears, MD        SURGICAL HISTORY:  Past Surgical History:  Procedure Laterality Date   HERNIA REPAIR     umbilical hernia  08/2701   INCISION AND DRAINAGE ABSCESS     "down the middle" of his buttocks  2015   TOTAL KNEE ARTHROPLASTY Left 10/29/2014   Procedure: TOTAL KNEE ARTHROPLASTY;  Surgeon: Dorna Leitz, MD;  Location: [facility];  Service: Orthopedics;  Laterality: Left;    REVIEW OF SYSTEMS:  Constitutional: negative Eyes: negative Ears, nose, mouth, throat, and face: negative Respiratory: negative Cardiovascular: negative Gastrointestinal:  negative Genitourinary:negative Integument/breast: negative Hematologic/lymphatic: negative Musculoskeletal:negative Neurological: negative Behavioral/Psych: negative Endocrine: negative Allergic/Immunologic: negative   PHYSICAL EXAMINATION: General appearance: alert, cooperative and no distress Head: Normocephalic, without obvious abnormality, atraumatic Neck: no adenopathy, no JVD, supple, symmetrical, trachea midline and thyroid not enlarged, symmetric, no tenderness/mass/nodules Lymph nodes: Cervical, supraclavicular, and axillary nodes normal. Resp: clear to auscultation bilaterally Back: symmetric, no curvature. ROM normal. No CVA tenderness. Cardio: regular rate and rhythm, S1, S2 normal, no murmur, click, rub or gallop GI: soft, non-tender; bowel sounds normal; no masses,  no organomegaly Extremities: extremities normal, atraumatic, no cyanosis or edema Neurologic: Alert and oriented X 3, normal strength and tone. Normal symmetric reflexes. Normal coordination and gait  ECOG PERFORMANCE STATUS: 1 - Symptomatic but completely ambulatory  Blood pressure 135/77, pulse 64, temperature 98.5 F (36.9 C), temperature source Oral, resp. rate 18, height 6\' 8"  (2.032 m), weight 283 lb 4.8 oz (128.5 kg), SpO2 97 %.  LABORATORY DATA: Lab Results  Component Value Date   WBC 6.3 08/26/2018   HGB 14.6 08/26/2018   HCT 44.6 08/26/2018   MCV 87.3 08/26/2018   PLT 175 08/26/2018      Chemistry      Component Value Date/Time   NA 139 08/05/2018 0934   NA 137 04/03/2017 1030   K 3.5 08/05/2018 0934   K 4.1 04/03/2017 1030   CL 105 08/05/2018 0934   CO2 27 08/05/2018 0934   CO2 27 04/03/2017 1030   BUN 17 08/05/2018 0934   BUN 15.5 04/03/2017 1030   CREATININE 0.93 08/05/2018 0934   CREATININE 0.8 04/03/2017 1030      Component Value Date/Time   CALCIUM 9.1 08/05/2018 0934   CALCIUM 9.4 04/03/2017 1030   ALKPHOS 83 08/05/2018 0934   ALKPHOS 97 04/03/2017 1030   AST 20  08/05/2018 0934   AST 14 04/03/2017 1030   ALT 17 08/05/2018 0934   ALT 21 04/03/2017 1030   BILITOT 1.2 08/05/2018 0934   BILITOT 0.67 04/03/2017 1030       RADIOGRAPHIC STUDIES: Ct Chest W Contrast  Result Date: 08/22/2018 CLINICAL DATA:  Lung cancer, completed chemotherapy and radiation therapy. Immunotherapy is ongoing. EXAM: CT CHEST, ABDOMEN, AND PELVIS WITH CONTRAST TECHNIQUE: Multidetector CT imaging of the chest, abdomen and pelvis was performed following the standard protocol during bolus administration of intravenous contrast. CONTRAST:  176mL OMNIPAQUE IOHEXOL 300 MG/ML  SOLN COMPARISON:  05/30/2018 and PET 01/27/2017. FINDINGS: CT CHEST FINDINGS Cardiovascular: Atherosclerotic calcification of the aorta, aortic valve and coronary arteries. Heart is at the upper limits of normal in size. No pericardial effusion. Mediastinum/Nodes: Mediastinal lymph nodes are not enlarged by CT size criteria. No hilar adenopathy. Left axillary lymph nodes measure up to 1.7 cm, previously 1.3 cm. Numerous other bilateral axillary lymph nodes are not enlarged by CT size criteria. Esophagus is grossly unremarkable. Lungs/Pleura: Spiculated apical segment right  upper lobe nodule measures 1.1 x 1.5 cm, stable. There are extensions to the visceral pleura. A 12 mm ground-glass nodule in the posterior aspect of the apical right upper lobe (series 4, image 50) and a 0.9 x 1.9 cm ground-glass nodule in the superior segment left lower lobe (39) appear stable from 05/30/2018 but are new from 07/22/2017. There is new and somewhat amorphous appearing ground-glass in the medial aspect of the apically left upper lobe, surrounding a stable 6 mm solid nodule (image 29). No pleural fluid. Adherent debris in the trachea. Airway is otherwise unremarkable. Musculoskeletal: No worrisome lytic or sclerotic lesions. CT ABDOMEN PELVIS FINDINGS Hepatobiliary: Liver margin is slightly irregular. Gallbladder is decompressed. No biliary  ductal dilatation. Pancreas: Negative. Spleen: Negative. Adrenals/Urinary Tract: Right adrenal nodules measure up to 2.9 cm in long axis, stable from 05/30/2018 and shown to be fluid in density on 01/19/2017. Left adrenal gland is unremarkable. 2.1 cm fluid density lesion in the right kidney is likely a cyst. Tiny right renal stone. Accessory renal arteries bilaterally. Ureters are decompressed. Bladder is low in volume. Stomach/Bowel: Stomach, small bowel, appendix and colon are unremarkable. Vascular/Lymphatic: Atherosclerotic calcification of the aorta. Infrarenal aorta measures up to 3.0 cm. There is a splenorenal shunt. Porta hepatis lymph nodes measure up to 1.4 cm, similar. Reproductive: Prostate is visualized. Other: No free fluid. Mesenteries and peritoneum are otherwise unremarkable. Right inguinal hernia contains fat. Musculoskeletal: Degenerative changes in the spine. No worrisome lytic or sclerotic lesions. IMPRESSION: 1. Spiculated primary right upper lobe nodule is stable. 2. Slight enlargement of a left axillary lymph node, nonspecific. 3. Nonspecific areas of ground-glass in the lungs bilaterally, as discussed above. Continued attention on follow-up exams is warranted as low-grade adenocarcinoma cannot be excluded. 4. 6 mm left upper lobe nodule, stable. 5. Cirrhosis with a splenorenal shunt. 6. Right adrenal adenomas. 7. Tiny right renal stone. 8. Aortic atherosclerosis (ICD10-170.0). Coronary artery calcification. 9. Infrarenal Aortic aneurysm NOS (ICD10-I71.9). Recommend followup by ultrasound in 3 years. This recommendation follows ACR consensus guidelines: White Paper of the ACR Incidental Findings Committee II on Vascular Findings. J Am Coll Radiol 2013; 10:789-794. Electronically Signed   By: Lorin Picket M.D.   On: 08/22/2018 15:09   Ct Abdomen Pelvis W Contrast  Result Date: 08/22/2018 CLINICAL DATA:  Lung cancer, completed chemotherapy and radiation therapy. Immunotherapy is  ongoing. EXAM: CT CHEST, ABDOMEN, AND PELVIS WITH CONTRAST TECHNIQUE: Multidetector CT imaging of the chest, abdomen and pelvis was performed following the standard protocol during bolus administration of intravenous contrast. CONTRAST:  129mL OMNIPAQUE IOHEXOL 300 MG/ML  SOLN COMPARISON:  05/30/2018 and PET 01/27/2017. FINDINGS: CT CHEST FINDINGS Cardiovascular: Atherosclerotic calcification of the aorta, aortic valve and coronary arteries. Heart is at the upper limits of normal in size. No pericardial effusion. Mediastinum/Nodes: Mediastinal lymph nodes are not enlarged by CT size criteria. No hilar adenopathy. Left axillary lymph nodes measure up to 1.7 cm, previously 1.3 cm. Numerous other bilateral axillary lymph nodes are not enlarged by CT size criteria. Esophagus is grossly unremarkable. Lungs/Pleura: Spiculated apical segment right upper lobe nodule measures 1.1 x 1.5 cm, stable. There are extensions to the visceral pleura. A 12 mm ground-glass nodule in the posterior aspect of the apical right upper lobe (series 4, image 50) and a 0.9 x 1.9 cm ground-glass nodule in the superior segment left lower lobe (39) appear stable from 05/30/2018 but are new from 07/22/2017. There is new and somewhat amorphous appearing ground-glass in the medial aspect of  the apically left upper lobe, surrounding a stable 6 mm solid nodule (image 29). No pleural fluid. Adherent debris in the trachea. Airway is otherwise unremarkable. Musculoskeletal: No worrisome lytic or sclerotic lesions. CT ABDOMEN PELVIS FINDINGS Hepatobiliary: Liver margin is slightly irregular. Gallbladder is decompressed. No biliary ductal dilatation. Pancreas: Negative. Spleen: Negative. Adrenals/Urinary Tract: Right adrenal nodules measure up to 2.9 cm in long axis, stable from 05/30/2018 and shown to be fluid in density on 01/19/2017. Left adrenal gland is unremarkable. 2.1 cm fluid density lesion in the right kidney is likely a cyst. Tiny right renal  stone. Accessory renal arteries bilaterally. Ureters are decompressed. Bladder is low in volume. Stomach/Bowel: Stomach, small bowel, appendix and colon are unremarkable. Vascular/Lymphatic: Atherosclerotic calcification of the aorta. Infrarenal aorta measures up to 3.0 cm. There is a splenorenal shunt. Porta hepatis lymph nodes measure up to 1.4 cm, similar. Reproductive: Prostate is visualized. Other: No free fluid. Mesenteries and peritoneum are otherwise unremarkable. Right inguinal hernia contains fat. Musculoskeletal: Degenerative changes in the spine. No worrisome lytic or sclerotic lesions. IMPRESSION: 1. Spiculated primary right upper lobe nodule is stable. 2. Slight enlargement of a left axillary lymph node, nonspecific. 3. Nonspecific areas of ground-glass in the lungs bilaterally, as discussed above. Continued attention on follow-up exams is warranted as low-grade adenocarcinoma cannot be excluded. 4. 6 mm left upper lobe nodule, stable. 5. Cirrhosis with a splenorenal shunt. 6. Right adrenal adenomas. 7. Tiny right renal stone. 8. Aortic atherosclerosis (ICD10-170.0). Coronary artery calcification. 9. Infrarenal Aortic aneurysm NOS (ICD10-I71.9). Recommend followup by ultrasound in 3 years. This recommendation follows ACR consensus guidelines: White Paper of the ACR Incidental Findings Committee II on Vascular Findings. J Am Coll Radiol 2013; 10:789-794. Electronically Signed   By: Lorin Picket M.D.   On: 08/22/2018 15:09    ASSESSMENT AND PLAN: This is a very pleasant 68 years old white male with a stage IV non-small cell lung cancer, squamous cell carcinoma.  The patient is currently undergoing systemic chemotherapy with carboplatin, paclitaxel and Keytruda status post 4 cycles with partial response. He is currently undergoing maintenance treatment with single agent Keytruda status post 25 cycles. The patient continues to tolerate this treatment well with no concerning adverse effects. He had  repeat CT scan chest, abdomen pelvis performed recently.  I personally and independently reviewed the scans and discussed the results with the patient today. His scan showed no concerning findings for disease progression. I recommended for the patient to continue his current treatment with single agent Keytruda and he will proceed with cycle #26 today. I will see him back for follow-up visit in 3 weeks for evaluation before starting cycle #27. The patient was advised to call immediately if he has any concerning symptoms in the interval. The patient voices understanding of current disease status and treatment options and is in agreement with the current care plan. All questions were answered. The patient knows to call the clinic with any problems, questions or concerns. We can certainly see the patient much sooner if necessary.  Disclaimer: This note was dictated with voice recognition software. Similar sounding words can inadvertently be transcribed and may not be corrected upon review.

## 2018-08-28 ENCOUNTER — Other Ambulatory Visit: Payer: Self-pay | Admitting: Cardiology

## 2018-08-28 ENCOUNTER — Other Ambulatory Visit: Payer: Self-pay | Admitting: Internal Medicine

## 2018-08-28 DIAGNOSIS — G6289 Other specified polyneuropathies: Secondary | ICD-10-CM

## 2018-09-16 ENCOUNTER — Encounter: Payer: Self-pay | Admitting: Internal Medicine

## 2018-09-16 ENCOUNTER — Inpatient Hospital Stay: Payer: Medicare Other | Attending: Oncology

## 2018-09-16 ENCOUNTER — Inpatient Hospital Stay: Payer: Medicare Other

## 2018-09-16 ENCOUNTER — Other Ambulatory Visit: Payer: Self-pay

## 2018-09-16 ENCOUNTER — Inpatient Hospital Stay (HOSPITAL_BASED_OUTPATIENT_CLINIC_OR_DEPARTMENT_OTHER): Payer: Medicare Other | Admitting: Internal Medicine

## 2018-09-16 VITALS — BP 131/71 | HR 57 | Temp 98.3°F | Resp 17 | Ht >= 80 in | Wt 287.4 lb

## 2018-09-16 DIAGNOSIS — I10 Essential (primary) hypertension: Secondary | ICD-10-CM | POA: Diagnosis not present

## 2018-09-16 DIAGNOSIS — J449 Chronic obstructive pulmonary disease, unspecified: Secondary | ICD-10-CM | POA: Insufficient documentation

## 2018-09-16 DIAGNOSIS — Z5112 Encounter for antineoplastic immunotherapy: Secondary | ICD-10-CM

## 2018-09-16 DIAGNOSIS — Z79899 Other long term (current) drug therapy: Secondary | ICD-10-CM | POA: Insufficient documentation

## 2018-09-16 DIAGNOSIS — C3491 Malignant neoplasm of unspecified part of right bronchus or lung: Secondary | ICD-10-CM

## 2018-09-16 DIAGNOSIS — N189 Chronic kidney disease, unspecified: Secondary | ICD-10-CM | POA: Diagnosis not present

## 2018-09-16 DIAGNOSIS — Z87891 Personal history of nicotine dependence: Secondary | ICD-10-CM | POA: Insufficient documentation

## 2018-09-16 DIAGNOSIS — C3411 Malignant neoplasm of upper lobe, right bronchus or lung: Secondary | ICD-10-CM | POA: Diagnosis present

## 2018-09-16 DIAGNOSIS — R5382 Chronic fatigue, unspecified: Secondary | ICD-10-CM

## 2018-09-16 DIAGNOSIS — I4892 Unspecified atrial flutter: Secondary | ICD-10-CM

## 2018-09-16 LAB — CBC WITH DIFFERENTIAL (CANCER CENTER ONLY)
Abs Immature Granulocytes: 0.01 10*3/uL (ref 0.00–0.07)
Basophils Absolute: 0.1 10*3/uL (ref 0.0–0.1)
Basophils Relative: 1 %
Eosinophils Absolute: 0.2 10*3/uL (ref 0.0–0.5)
Eosinophils Relative: 3 %
HCT: 41.7 % (ref 39.0–52.0)
Hemoglobin: 13.7 g/dL (ref 13.0–17.0)
Immature Granulocytes: 0 %
Lymphocytes Relative: 33 %
Lymphs Abs: 1.9 10*3/uL (ref 0.7–4.0)
MCH: 28.6 pg (ref 26.0–34.0)
MCHC: 32.9 g/dL (ref 30.0–36.0)
MCV: 87.1 fL (ref 80.0–100.0)
Monocytes Absolute: 0.5 10*3/uL (ref 0.1–1.0)
Monocytes Relative: 9 %
Neutro Abs: 3.1 10*3/uL (ref 1.7–7.7)
Neutrophils Relative %: 54 %
Platelet Count: 175 10*3/uL (ref 150–400)
RBC: 4.79 MIL/uL (ref 4.22–5.81)
RDW: 13.4 % (ref 11.5–15.5)
WBC Count: 5.8 10*3/uL (ref 4.0–10.5)
nRBC: 0 % (ref 0.0–0.2)

## 2018-09-16 LAB — CMP (CANCER CENTER ONLY)
ALT: 16 U/L (ref 0–44)
AST: 17 U/L (ref 15–41)
Albumin: 3.6 g/dL (ref 3.5–5.0)
Alkaline Phosphatase: 83 U/L (ref 38–126)
Anion gap: 10 (ref 5–15)
BUN: 13 mg/dL (ref 8–23)
CO2: 23 mmol/L (ref 22–32)
Calcium: 9 mg/dL (ref 8.9–10.3)
Chloride: 106 mmol/L (ref 98–111)
Creatinine: 0.93 mg/dL (ref 0.61–1.24)
GFR, Est AFR Am: >60 mL/min (ref >60)
GFR, Estimated: >60 mL/min (ref >60)
Glucose, Bld: 126 mg/dL — ABNORMAL HIGH (ref 70–99)
Potassium: 3.6 mmol/L (ref 3.5–5.1)
Sodium: 139 mmol/L (ref 135–145)
Total Bilirubin: 1 mg/dL (ref 0.3–1.2)
Total Protein: 6.7 g/dL (ref 6.5–8.1)

## 2018-09-16 LAB — TSH: TSH: 2.363 u[IU]/mL (ref 0.320–4.118)

## 2018-09-16 MED ORDER — SODIUM CHLORIDE 0.9 % IV SOLN
Freq: Once | INTRAVENOUS | Status: AC
  Administered 2018-09-16: 12:00:00 via INTRAVENOUS
  Filled 2018-09-16: qty 250

## 2018-09-16 MED ORDER — SODIUM CHLORIDE 0.9 % IV SOLN
200.0000 mg | Freq: Once | INTRAVENOUS | Status: AC
  Administered 2018-09-16: 200 mg via INTRAVENOUS
  Filled 2018-09-16: qty 8

## 2018-09-16 NOTE — Patient Instructions (Signed)
[facility] Discharge Instructions for Patients Receiving Chemotherapy  Today you received the following chemotherapy agents:  Keytruda.  To help prevent nausea and vomiting after your treatment, we encourage you to take your nausea medication as directed.   If you develop nausea and vomiting that is not controlled by your nausea medication, call the clinic.   BELOW ARE SYMPTOMS THAT SHOULD BE REPORTED IMMEDIATELY:  *FEVER GREATER THAN 100.5 F  *CHILLS WITH OR WITHOUT FEVER  NAUSEA AND VOMITING THAT IS NOT CONTROLLED WITH YOUR NAUSEA MEDICATION  *UNUSUAL SHORTNESS OF BREATH  *UNUSUAL BRUISING OR BLEEDING  TENDERNESS IN MOUTH AND THROAT WITH OR WITHOUT PRESENCE OF ULCERS  *URINARY PROBLEMS  *BOWEL PROBLEMS  UNUSUAL RASH Items with * indicate a potential emergency and should be followed up as soon as possible.  Feel free to call the clinic should you have any questions or concerns. The clinic phone number is [phone].  Please show the CHEMO ALERT CARD at check-in to the Emergency Department and triage nurse.    

## 2018-09-16 NOTE — Progress Notes (Signed)
[facility] [phone]   [phone]  OFFICE PROGRESS NOTE  Sheryle Hail, PA-C [address] [place] [address]  DIAGNOSIS: Stage IV (T2a,N3,M1c)non-small cell lung cancer, squamous cell carcinoma presented with right upper lobe lung mass in addition to right hilar and mediastinal adenopathy as well as metastatic disease in the medial right thigh diagnosed in November 2018.  PRIOR THERAPY:  1) Palliative radiotherapy to the right lower extremity mass completed April 09, 2017. 2) Systemic chemotherapy with carboplatin for AUC of 5, paclitaxel 175 mg/M2 and Keytruda 200 mg IV every 3 weeks.  First dose March 12, 2017, status post 4 cycles with partial response.  CURRENT THERAPY: Maintenance treatment with single agent Keytruda 200 mg IV every 3 weeks.  First cycle June 11, 2017.  Status post 25 cycles.  INTERVAL HISTORY: Aaron Sellers 68 y.o. male returns to the clinic today for follow-up visit.  The patient is feeling fine today with no concerning complaints except for mild swelling of the lower extremities.  He did not take his Lasix as scheduled today because of the travel and concern about frequent visit to the bathroom.  He denied having any chest pain, shortness of breath, cough or hemoptysis.  He denied having any fever or chills.  He has no nausea, vomiting, diarrhea or constipation.  He denied having any headache or visual changes.  MEDICAL HISTORY: Past Medical History:  Diagnosis Date  . Arthritis   . Atrial flutter ([facility])   . BPH (benign prostatic hypertrophy) with urinary retention   . Cancer ([facility])   . Chronic kidney disease    hx of kidney stones  2011  . COPD (chronic obstructive pulmonary disease) ([facility])    has been smoking since he was 22 ish--  . High cholesterol   . Hypertension    dx around 2011  . Squamous cell carcinoma of right lung (H[facility])     ALLERGIES:  is allergic to ciprofloxacin; adhesive [tape];  and codeine.  MEDICATIONS:  Current Outpatient Medications  Medication Sig Dispense Refill  . acetaminophen (TYLENOL) 500 MG tablet Take 1,000 mg by mouth every 6 (six) hours as needed for moderate pain.    Marland Kitchen atorvastatin (LIPITOR) 10 MG tablet Take 10 mg by mouth every evening.     Marland Kitchen CARTIA XT 180 MG 24 hr capsule Take 1 capsule by mouth once daily 90 capsule 0  . Cyanocobalamin (VITAMIN B-12 PO) Take 1 tablet by mouth daily.    . famotidine (PEPCID) 10 MG tablet Take 10 mg by mouth as needed for heartburn or indigestion.    . furosemide (LASIX) 40 MG tablet Take 1 tablet (40 mg total) by mouth daily as needed. 90 tablet 3  . gabapentin (NEURONTIN) 300 MG capsule TAKE 1 CAPSULE BY MOUTH THREE TIMES DAILY 90 capsule 0  . loratadine (CLARITIN) 10 MG tablet Take 10 mg by mouth daily.    . tamsulosin (FLOMAX) 0.4 MG CAPS capsule Take 0.4 mg by mouth daily.     No current facility-administered medications for this visit.    Facility-Administered Medications Ordered in Other Visits  Medication Dose Route Frequency Provider Last Rate Last Dose  . heparin lock flush 100 unit/mL  500 Units Intracatheter Once PRN Curt Bears, MD      . sodium chloride flush (NS) 0.9 % injection 10 mL  10 mL Intracatheter PRN Curt Bears, MD        SURGICAL HISTORY:  Past Surgical History:  Procedure Laterality Date  . HERNIA REPAIR     umbilical hernia  0/1601  . INCISION AND DRAINAGE ABSCESS     "down the middle" of his buttocks  2015  . TOTAL KNEE ARTHROPLASTY Left 10/29/2014   Procedure: TOTAL KNEE ARTHROPLASTY;  Surgeon: Dorna Leitz, MD;  Location: [facility];  Service: Orthopedics;  Laterality: Left;    REVIEW OF SYSTEMS:  A comprehensive review of systems was negative except for: Constitutional: positive for fatigue   PHYSICAL EXAMINATION: General appearance: alert, cooperative and no distress Head: Normocephalic, without obvious abnormality, atraumatic Neck: no adenopathy, no JVD, supple,  symmetrical, trachea midline and thyroid not enlarged, symmetric, no tenderness/mass/nodules Lymph nodes: Cervical, supraclavicular, and axillary nodes normal. Resp: clear to auscultation bilaterally Back: symmetric, no curvature. ROM normal. No CVA tenderness. Cardio: regular rate and rhythm, S1, S2 normal, no murmur, click, rub or gallop GI: soft, non-tender; bowel sounds normal; no masses,  no organomegaly Extremities: edema 1+  ECOG PERFORMANCE STATUS: 1 - Symptomatic but completely ambulatory  Blood pressure 131/71, pulse (!) 57, temperature 98.3 F (36.8 C), temperature source Oral, resp. rate 17, height 6\' 8"  (2.032 m), weight 287 lb 6.4 oz (130.4 kg), SpO2 98 %.  LABORATORY DATA: Lab Results  Component Value Date   WBC 5.8 09/16/2018   HGB 13.7 09/16/2018   HCT 41.7 09/16/2018   MCV 87.1 09/16/2018   PLT 175 09/16/2018      Chemistry      Component Value Date/Time   NA 139 09/16/2018 1037   NA 137 04/03/2017 1030   K 3.6 09/16/2018 1037   K 4.1 04/03/2017 1030   CL 106 09/16/2018 1037   CO2 23 09/16/2018 1037   CO2 27 04/03/2017 1030   BUN 13 09/16/2018 1037   BUN 15.5 04/03/2017 1030   CREATININE 0.93 09/16/2018 1037   CREATININE 0.8 04/03/2017 1030      Component Value Date/Time   CALCIUM 9.0 09/16/2018 1037   CALCIUM 9.4 04/03/2017 1030   ALKPHOS 83 09/16/2018 1037   ALKPHOS 97 04/03/2017 1030   AST 17 09/16/2018 1037   AST 14 04/03/2017 1030   ALT 16 09/16/2018 1037   ALT 21 04/03/2017 1030   BILITOT 1.0 09/16/2018 1037   BILITOT 0.67 04/03/2017 1030       RADIOGRAPHIC STUDIES: Ct Chest W Contrast  Result Date: 08/22/2018 CLINICAL DATA:  Lung cancer, completed chemotherapy and radiation therapy. Immunotherapy is ongoing. EXAM: CT CHEST, ABDOMEN, AND PELVIS WITH CONTRAST TECHNIQUE: Multidetector CT imaging of the chest, abdomen and pelvis was performed following the standard protocol during bolus administration of intravenous contrast. CONTRAST:   13mL OMNIPAQUE IOHEXOL 300 MG/ML  SOLN COMPARISON:  05/30/2018 and PET 01/27/2017. FINDINGS: CT CHEST FINDINGS Cardiovascular: Atherosclerotic calcification of the aorta, aortic valve and coronary arteries. Heart is at the upper limits of normal in size. No pericardial effusion. Mediastinum/Nodes: Mediastinal lymph nodes are not enlarged by CT size criteria. No hilar adenopathy. Left axillary lymph nodes measure up to 1.7 cm, previously 1.3 cm. Numerous other bilateral axillary lymph nodes are not enlarged by CT size criteria. Esophagus is grossly unremarkable. Lungs/Pleura: Spiculated apical segment right upper lobe nodule measures 1.1 x 1.5 cm, stable. There are extensions to the visceral pleura. A 12 mm ground-glass nodule in the posterior aspect of the apical right upper lobe (series 4, image 50) and a 0.9 x 1.9 cm ground-glass nodule in the superior segment left lower lobe (39) appear stable from 05/30/2018 but are new from  07/22/2017. There is new and somewhat amorphous appearing ground-glass in the medial aspect of the apically left upper lobe, surrounding a stable 6 mm solid nodule (image 29). No pleural fluid. Adherent debris in the trachea. Airway is otherwise unremarkable. Musculoskeletal: No worrisome lytic or sclerotic lesions. CT ABDOMEN PELVIS FINDINGS Hepatobiliary: Liver margin is slightly irregular. Gallbladder is decompressed. No biliary ductal dilatation. Pancreas: Negative. Spleen: Negative. Adrenals/Urinary Tract: Right adrenal nodules measure up to 2.9 cm in long axis, stable from 05/30/2018 and shown to be fluid in density on 01/19/2017. Left adrenal gland is unremarkable. 2.1 cm fluid density lesion in the right kidney is likely a cyst. Tiny right renal stone. Accessory renal arteries bilaterally. Ureters are decompressed. Bladder is low in volume. Stomach/Bowel: Stomach, small bowel, appendix and colon are unremarkable. Vascular/Lymphatic: Atherosclerotic calcification of the aorta.  Infrarenal aorta measures up to 3.0 cm. There is a splenorenal shunt. Porta hepatis lymph nodes measure up to 1.4 cm, similar. Reproductive: Prostate is visualized. Other: No free fluid. Mesenteries and peritoneum are otherwise unremarkable. Right inguinal hernia contains fat. Musculoskeletal: Degenerative changes in the spine. No worrisome lytic or sclerotic lesions. IMPRESSION: 1. Spiculated primary right upper lobe nodule is stable. 2. Slight enlargement of a left axillary lymph node, nonspecific. 3. Nonspecific areas of ground-glass in the lungs bilaterally, as discussed above. Continued attention on follow-up exams is warranted as low-grade adenocarcinoma cannot be excluded. 4. 6 mm left upper lobe nodule, stable. 5. Cirrhosis with a splenorenal shunt. 6. Right adrenal adenomas. 7. Tiny right renal stone. 8. Aortic atherosclerosis (ICD10-170.0). Coronary artery calcification. 9. Infrarenal Aortic aneurysm NOS (ICD10-I71.9). Recommend followup by ultrasound in 3 years. This recommendation follows ACR consensus guidelines: White Paper of the ACR Incidental Findings Committee II on Vascular Findings. J Am Coll Radiol 2013; 10:789-794. Electronically Signed   By: Lorin Picket M.D.   On: 08/22/2018 15:09   Ct Abdomen Pelvis W Contrast  Result Date: 08/22/2018 CLINICAL DATA:  Lung cancer, completed chemotherapy and radiation therapy. Immunotherapy is ongoing. EXAM: CT CHEST, ABDOMEN, AND PELVIS WITH CONTRAST TECHNIQUE: Multidetector CT imaging of the chest, abdomen and pelvis was performed following the standard protocol during bolus administration of intravenous contrast. CONTRAST:  176mL OMNIPAQUE IOHEXOL 300 MG/ML  SOLN COMPARISON:  05/30/2018 and PET 01/27/2017. FINDINGS: CT CHEST FINDINGS Cardiovascular: Atherosclerotic calcification of the aorta, aortic valve and coronary arteries. Heart is at the upper limits of normal in size. No pericardial effusion. Mediastinum/Nodes: Mediastinal lymph nodes are not  enlarged by CT size criteria. No hilar adenopathy. Left axillary lymph nodes measure up to 1.7 cm, previously 1.3 cm. Numerous other bilateral axillary lymph nodes are not enlarged by CT size criteria. Esophagus is grossly unremarkable. Lungs/Pleura: Spiculated apical segment right upper lobe nodule measures 1.1 x 1.5 cm, stable. There are extensions to the visceral pleura. A 12 mm ground-glass nodule in the posterior aspect of the apical right upper lobe (series 4, image 50) and a 0.9 x 1.9 cm ground-glass nodule in the superior segment left lower lobe (39) appear stable from 05/30/2018 but are new from 07/22/2017. There is new and somewhat amorphous appearing ground-glass in the medial aspect of the apically left upper lobe, surrounding a stable 6 mm solid nodule (image 29). No pleural fluid. Adherent debris in the trachea. Airway is otherwise unremarkable. Musculoskeletal: No worrisome lytic or sclerotic lesions. CT ABDOMEN PELVIS FINDINGS Hepatobiliary: Liver margin is slightly irregular. Gallbladder is decompressed. No biliary ductal dilatation. Pancreas: Negative. Spleen: Negative. Adrenals/Urinary Tract: Right adrenal nodules  measure up to 2.9 cm in long axis, stable from 05/30/2018 and shown to be fluid in density on 01/19/2017. Left adrenal gland is unremarkable. 2.1 cm fluid density lesion in the right kidney is likely a cyst. Tiny right renal stone. Accessory renal arteries bilaterally. Ureters are decompressed. Bladder is low in volume. Stomach/Bowel: Stomach, small bowel, appendix and colon are unremarkable. Vascular/Lymphatic: Atherosclerotic calcification of the aorta. Infrarenal aorta measures up to 3.0 cm. There is a splenorenal shunt. Porta hepatis lymph nodes measure up to 1.4 cm, similar. Reproductive: Prostate is visualized. Other: No free fluid. Mesenteries and peritoneum are otherwise unremarkable. Right inguinal hernia contains fat. Musculoskeletal: Degenerative changes in the spine. No  worrisome lytic or sclerotic lesions. IMPRESSION: 1. Spiculated primary right upper lobe nodule is stable. 2. Slight enlargement of a left axillary lymph node, nonspecific. 3. Nonspecific areas of ground-glass in the lungs bilaterally, as discussed above. Continued attention on follow-up exams is warranted as low-grade adenocarcinoma cannot be excluded. 4. 6 mm left upper lobe nodule, stable. 5. Cirrhosis with a splenorenal shunt. 6. Right adrenal adenomas. 7. Tiny right renal stone. 8. Aortic atherosclerosis (ICD10-170.0). Coronary artery calcification. 9. Infrarenal Aortic aneurysm NOS (ICD10-I71.9). Recommend followup by ultrasound in 3 years. This recommendation follows ACR consensus guidelines: White Paper of the ACR Incidental Findings Committee II on Vascular Findings. J Am Coll Radiol 2013; 10:789-794. Electronically Signed   By: Lorin Picket M.D.   On: 08/22/2018 15:09    ASSESSMENT AND PLAN: This is a very pleasant 68 years old white male with a stage IV non-small cell lung cancer, squamous cell carcinoma.  The patient is currently undergoing systemic chemotherapy with carboplatin, paclitaxel and Keytruda status post 4 cycles with partial response. He is currently undergoing maintenance treatment with single agent Keytruda status post 25 cycles. He continues to tolerate his treatment well with no concerning adverse effects. I recommended for the patient to proceed with cycle #26 today as planned. I will see him back for follow-up visit in 3 weeks for evaluation before starting cycle #27. The patient was advised to call immediately if he has any concerning symptoms in the interval. The patient voices understanding of current disease status and treatment options and is in agreement with the current care plan. All questions were answered. The patient knows to call the clinic with any problems, questions or concerns. We can certainly see the patient much sooner if necessary.  Disclaimer: This  note was dictated with voice recognition software. Similar sounding words can inadvertently be transcribed and may not be corrected upon review.

## 2018-09-18 ENCOUNTER — Telehealth: Payer: Self-pay | Admitting: Internal Medicine

## 2018-09-18 NOTE — Telephone Encounter (Signed)
Scheduled per los. Mailed printout  °

## 2018-09-22 ENCOUNTER — Telehealth: Payer: Self-pay | Admitting: Cardiology

## 2018-09-22 NOTE — Telephone Encounter (Signed)
Virtual Visit Pre-Appointment Phone Call  "(Name), I am calling you today to discuss your upcoming appointment. We are currently trying to limit exposure to the virus that causes COVID-19 by seeing patients at home rather than in the office."  1. "What is the BEST phone number to call the day of the visit?" - include this in appointment notes  2. Do you have or have access to (through a family member/friend) a smartphone with video capability that we can use for your visit?" a. If yes - list this number in appt notes as cell (if different from BEST phone #) and list the appointment type as a VIDEO visit in appointment notes b. If no - list the appointment type as a PHONE visit in appointment notes  3. Confirm consent - "In the setting of the current Covid19 crisis, you are scheduled for a (phone or video) visit with your provider on (date) at (time).  Just as we do with many in-office visits, in order for you to participate in this visit, we must obtain consent.  If you'd like, I can send this to your mychart (if signed up) or email for you to review.  Otherwise, I can obtain your verbal consent now.  All virtual visits are billed to your insurance company just like a normal visit would be.  By agreeing to a virtual visit, we'd like you to understand that the technology does not allow for your provider to perform an examination, and thus may limit your provider's ability to fully assess your condition. If your provider identifies any concerns that need to be evaluated in person, we will make arrangements to do so.  Finally, though the technology is pretty good, we cannot assure that it will always work on either your or our end, and in the setting of a video visit, we may have to convert it to a phone-only visit.  In either situation, we cannot ensure that we have a secure connection.  Are you willing to proceed?" STAFF: Did the patient verbally acknowledge consent to telehealth visit? Document  YES/NO here: yes  4. Advise patient to be prepared - "Two hours prior to your appointment, go ahead and check your blood pressure, pulse, oxygen saturation, and your weight (if you have the equipment to check those) and write them all down. When your visit starts, your provider will ask you for this information. If you have an Apple Watch or Kardia device, please plan to have heart rate information ready on the day of your appointment. Please have a pen and paper handy nearby the day of the visit as well."  5. Give patient instructions for MyChart download to smartphone OR Doximity/[email or web address] as below if video visit (depending on what platform provider is using)  6. Inform patient they will receive a phone call 15 minutes prior to their appointment time (may be from unknown caller ID) so they should be prepared to answer    TELEPHONE CALL NOTE  Aaron Sellers has been deemed a candidate for a follow-up tele-health visit to limit community exposure during the Covid-19 pandemic. I spoke with the patient via phone to ensure availability of phone/video source, confirm preferred email & phone number, and discuss instructions and expectations.  I reminded Aaron Sellers to be prepared with any vital sign and/or heart rhythm information that could potentially be obtained via home monitoring, at the time of his visit. I reminded Aaron Sellers to expect a phone call prior to his visit.  Aaron Sellers 09/22/2018 9:15 AM   INSTRUCTIONS FOR DOWNLOADING THE MYCHART APP TO SMARTPHONE  - The patient must first make sure to have activated MyChart and know their login information - If Apple, go to [employer] and type in MyChart in the search bar and download the app. If Android, ask patient to go to [employer] and type in [facility] in the search bar and download the app. The app is free but as with any other app downloads, their phone may require them to verify saved payment information or Apple/Android  password.  - The patient will need to then log into the app with their MyChart username and password, and select [facility] as their healthcare provider to link the account. When it is time for your visit, go to the MyChart app, find appointments, and click Begin Video Visit. Be sure to Select Allow for your device to access the Microphone and Camera for your visit. You will then be connected, and your provider will be with you shortly.  **If they have any issues connecting, or need assistance please contact MyChart service desk [phone])**  **If using a computer, in order to ensure the best quality for their visit they will need to use either of the following Internet Browsers: [employer], or Google Chrome**  IF USING DOXIMITY or DOXY.ME - The patient will receive a link just prior to their visit by text.     FULL LENGTH CONSENT FOR TELE-HEALTH VISIT   I hereby voluntarily request, consent and authorize [facility] and its employed or contracted physicians, physician assistants, nurse practitioners or other licensed health care professionals (the Practitioner), to provide me with telemedicine health care services (the Services") as deemed necessary by the treating Practitioner. I acknowledge and consent to receive the Services by the Practitioner via telemedicine. I understand that the telemedicine visit will involve communicating with the Practitioner through live audiovisual communication technology and the disclosure of certain medical information by electronic transmission. I acknowledge that I have been given the opportunity to request an in-person assessment or other available alternative prior to the telemedicine visit and am voluntarily participating in the telemedicine visit.  I understand that I have the right to withhold or withdraw my consent to the use of telemedicine in the course of my care at any time, without affecting my right to future care or treatment,  and that the Practitioner or I may terminate the telemedicine visit at any time. I understand that I have the right to inspect all information obtained and/or recorded in the course of the telemedicine visit and may receive copies of available information for a reasonable fee.  I understand that some of the potential risks of receiving the Services via telemedicine include:   Delay or interruption in medical evaluation due to technological equipment failure or disruption;  Information transmitted may not be sufficient (e.g. poor resolution of images) to allow for appropriate medical decision making by the Practitioner; and/or   In rare instances, security protocols could fail, causing a breach of personal health information.  Furthermore, I acknowledge that it is my responsibility to provide information about my medical history, conditions and care that is complete and accurate to the best of my ability. I acknowledge that Practitioner's advice, recommendations, and/or decision may be based on factors not within their control, such as incomplete or inaccurate data provided by me or distortions of diagnostic images or specimens that may result from electronic transmissions. I understand that the  practice of medicine is not an Chief Strategy Officer and that Practitioner makes no warranties or guarantees regarding treatment outcomes. I acknowledge that I will receive a copy of this consent concurrently upon execution via email to the email address I last provided but may also request a printed copy by calling the office of [facility].    I understand that my insurance will be billed for this visit.   I have read or had this consent read to me.  I understand the contents of this consent, which adequately explains the benefits and risks of the Services being provided via telemedicine.   I have been provided ample opportunity to ask questions regarding this consent and the Services and have had my questions  answered to my satisfaction.  I give my informed consent for the services to be provided through the use of telemedicine in my medical care  By participating in this telemedicine visit I agree to the above.

## 2018-09-24 ENCOUNTER — Encounter: Payer: Self-pay | Admitting: Cardiology

## 2018-09-24 ENCOUNTER — Telehealth (INDEPENDENT_AMBULATORY_CARE_PROVIDER_SITE_OTHER): Payer: Medicare Other | Admitting: Cardiology

## 2018-09-24 VITALS — Ht >= 80 in | Wt 287.0 lb

## 2018-09-24 DIAGNOSIS — I4892 Unspecified atrial flutter: Secondary | ICD-10-CM | POA: Diagnosis not present

## 2018-09-24 DIAGNOSIS — R600 Localized edema: Secondary | ICD-10-CM

## 2018-09-24 NOTE — Progress Notes (Signed)
Virtual Visit via Telephone Note   This visit type was conducted due to national recommendations for restrictions regarding the COVID-19 Pandemic (e.g. social distancing) in an effort to limit this patient's exposure and mitigate transmission in our community.  Due to his co-morbid illnesses, this patient is at least at moderate risk for complications without adequate follow up.  This format is felt to be most appropriate for this patient at this time.  The patient did not have access to video technology/had technical difficulties with video requiring transitioning to audio format only (telephone).  All issues noted in this document were discussed and addressed.  No physical exam could be performed with this format.  Please refer to the patient's chart for his  consent to telehealth for [facility].   Date:  09/24/2018   ID:  Rhonda Vangieson, DOB October 27, 1950, MRN [id number]  Patient Location: Home Provider Location: Office  PCP:  Sheryle Hail, PA-C  Cardiologist:  Carlyle Dolly, MD  Electrophysiologist:  None   Evaluation Performed:  Follow-Up Visit  Chief Complaint:  6 month follow up  History of Present Illness:    Aaron Sellers is a 68 y.o. male seen today for follow up of the following medical problems.   1. Paroxysmal aflutter - admission 05/2017 with perforated bowel, issues with aflutter with RVR. Similar issues with his heart rates when admitted with diverticulitispreviously - failed av nodal agents, started on IV amiodaroneand converted to oral amio.  -he had not been on anticoag. When I saw him in 12/2016 he had a partially perforated bowel and newly diagnosed lung mass combined with low CHADS2Vasc score of 1, and thus anticoag was not started at that time. - prior to recent admission, rates had been reasonably been controlled ondilt 300mg  daily.     - occasional palpitations, lasts a few minutes. OVerall infrequent, mild - compliant with meds   2.  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 Leg swelling  - R>L edema is chronic, prior US negative for DVT.  - swelling is up and down, R>L. Resolves at night or laying down. Takes lasix prn, takes bout 4-5 days/ week. - weights are steady     SH: spends much of his time working on garden.    The patient does not have symptoms concerning for COVID-19 infection (fever, chills, cough, or new shortness of breath).    Past Medical History:  Diagnosis Date  . Arthritis   . Atrial flutter ([place])   . BPH (benign prostatic hypertrophy) with urinary retention   . Cancer ([place])   . Chronic kidney disease    hx of kidney stones  2011  . COPD (chronic obstructive pulmonary disease) ([place])    has been smoking since he was 22 ish--  . High cholesterol   . Hypertension    dx around 2011  . Squamous cell carcinoma of right lung [facility])    Past Surgical History:  Procedure Laterality Date  . HERNIA REPAIR     umbilical hernia  09/5100  . INCISION AND DRAINAGE ABSCESS     "down the middle" of his buttocks  2015  . TOTAL KNEE ARTHROPLASTY Left 10/29/2014   Procedure: TOTAL KNEE ARTHROPLASTY;  Surgeon: Dorna Leitz, MD;  Location: [facility];  Service: Orthopedics;  Laterality: Left;     Current Meds  Medication Sig  . acetaminophen (TYLENOL) 500 MG tablet Take 1,000 mg by mouth every 6 (six) hours as needed for moderate pain.  Marland Kitchen  atorvastatin (LIPITOR) 10 MG tablet Take 10 mg by mouth every evening.   Marland Kitchen CARTIA XT 180 MG 24 hr capsule Take 1 capsule by mouth once daily  . Cyanocobalamin (VITAMIN B-12 PO) Take 1 tablet by mouth daily.  . famotidine (PEPCID) 10 MG tablet Take 10 mg by mouth as needed for heartburn or indigestion.  . furosemide (LASIX) 40 MG tablet Take 1 tablet (40 mg total) by mouth daily as needed.  . gabapentin (NEURONTIN) 300 MG capsule TAKE 1 CAPSULE BY MOUTH THREE TIMES DAILY (Patient taking differently: 300 mg 2 (two) times daily. )  . loratadine (CLARITIN) 10 MG tablet Take  10 mg by mouth daily.  . tamsulosin (FLOMAX) 0.4 MG CAPS capsule Take 0.4 mg by mouth daily.     Allergies:   Ciprofloxacin, Adhesive [tape], and Codeine   Social History   Tobacco Use  . Smoking status: Former Smoker    Packs/day: 1.00    Years: 40.00    Pack years: 40.00    Types: Cigarettes    Quit date: 01/27/2017    Years since quitting: 1.6  . Smokeless tobacco: Former Systems developer    Types: Snuff  Substance Use Topics  . Alcohol use: No  . Drug use: No     Family Hx: The patient's family history includes CVA in his father; Hypertension in his brother, mother, and sister; Lung cancer in his brother and sister.  ROS:   Please see the history of present illness.     All other systems reviewed and are negative.   Prior CV studies:   The following studies were reviewed today:    Labs/Other Tests and Data Reviewed:    EKG:  n/a Recent Labs: 09/16/2018: ALT 16; BUN 13; Creatinine 0.93; Hemoglobin 13.7; Platelet Count 175; Potassium 3.6; Sodium 139; TSH 2.363   Recent Lipid Panel Lab Results  Component Value Date/Time   CHOL 117 01/29/2017 04:28 AM   TRIG 55 01/29/2017 04:28 AM   HDL 30 (L) 01/29/2017 04:28 AM   CHOLHDL 3.9 01/29/2017 04:28 AM   LDLCALC 76 01/29/2017 04:28 AM    Wt Readings from Last 3 Encounters:  09/24/18 287 lb (130.2 kg)  09/16/18 287 lb 6.4 oz (130.4 kg)  08/26/18 283 lb 4.8 oz (128.5 kg)     Objective:    Vital Signs:  Ht 6\' 8"  (2.032 m)   Wt 287 lb (130.2 kg)   BMI 31.53 kg/m    Today's Vitals   09/24/18 1448  Weight: 287 lb (130.2 kg)  Height: 6\' 8"  (2.032 m)   Body mass index is 31.53 kg/m.  Normal affect. Normal speech pattern and tone. Comfortable, no apparent distress. No audible signs of SOB or wheezing.   ASSESSMENT & PLAN:    1. Paroxysmal aflutter - mild palpitations at times, continue current meds - no anticoag due to low CHADS2Vasc score of 1 (age, history of HTN appears to be remote) and also recurrent issues  with perforated bowel.   2. LE edema -  Prior echo showed normal LVEF, suspect he has some diastolic dysfunction though echo could not technically indentify - overall controlled with prn lasix, continue current therapy     COVID-19 Education: The signs and symptoms of COVID-19 were discussed with the patient and how to seek care for testing (follow up with PCP or arrange E-visit).  The importance of social distancing was discussed today.  Time:   Today, I have spent 14 minutes with the patient with telehealth  technology discussing the above problems.     Medication Adjustments/Labs and Tests Ordered: Current medicines are reviewed at length with the patient today.  Concerns regarding medicines are outlined above.   Tests Ordered: No orders of the defined types were placed in this encounter.   Medication Changes: No orders of the defined types were placed in this encounter.   Follow Up:  Virtual Visit in 6 month(s)  Signed, Carlyle Dolly, MD  09/24/2018 2:50 PM    [place]

## 2018-09-24 NOTE — Patient Instructions (Signed)

## 2018-10-07 ENCOUNTER — Encounter: Payer: Self-pay | Admitting: Internal Medicine

## 2018-10-07 ENCOUNTER — Inpatient Hospital Stay: Payer: Medicare Other | Attending: Oncology

## 2018-10-07 ENCOUNTER — Inpatient Hospital Stay: Payer: Medicare Other

## 2018-10-07 ENCOUNTER — Other Ambulatory Visit: Payer: Self-pay

## 2018-10-07 ENCOUNTER — Inpatient Hospital Stay (HOSPITAL_BASED_OUTPATIENT_CLINIC_OR_DEPARTMENT_OTHER): Payer: Medicare Other | Admitting: Internal Medicine

## 2018-10-07 VITALS — BP 124/75 | HR 55 | Temp 98.9°F | Resp 18 | Ht >= 80 in | Wt 284.1 lb

## 2018-10-07 DIAGNOSIS — M1990 Unspecified osteoarthritis, unspecified site: Secondary | ICD-10-CM | POA: Insufficient documentation

## 2018-10-07 DIAGNOSIS — J449 Chronic obstructive pulmonary disease, unspecified: Secondary | ICD-10-CM | POA: Insufficient documentation

## 2018-10-07 DIAGNOSIS — Z5112 Encounter for antineoplastic immunotherapy: Secondary | ICD-10-CM | POA: Diagnosis present

## 2018-10-07 DIAGNOSIS — C3491 Malignant neoplasm of unspecified part of right bronchus or lung: Secondary | ICD-10-CM

## 2018-10-07 DIAGNOSIS — Z87891 Personal history of nicotine dependence: Secondary | ICD-10-CM | POA: Insufficient documentation

## 2018-10-07 DIAGNOSIS — C3411 Malignant neoplasm of upper lobe, right bronchus or lung: Secondary | ICD-10-CM

## 2018-10-07 DIAGNOSIS — I4892 Unspecified atrial flutter: Secondary | ICD-10-CM

## 2018-10-07 DIAGNOSIS — E7800 Pure hypercholesterolemia, unspecified: Secondary | ICD-10-CM | POA: Insufficient documentation

## 2018-10-07 DIAGNOSIS — Z79899 Other long term (current) drug therapy: Secondary | ICD-10-CM | POA: Insufficient documentation

## 2018-10-07 DIAGNOSIS — N189 Chronic kidney disease, unspecified: Secondary | ICD-10-CM | POA: Insufficient documentation

## 2018-10-07 DIAGNOSIS — I10 Essential (primary) hypertension: Secondary | ICD-10-CM | POA: Insufficient documentation

## 2018-10-07 DIAGNOSIS — Z923 Personal history of irradiation: Secondary | ICD-10-CM | POA: Diagnosis not present

## 2018-10-07 DIAGNOSIS — R5382 Chronic fatigue, unspecified: Secondary | ICD-10-CM

## 2018-10-07 LAB — CMP (CANCER CENTER ONLY)
ALT: 16 U/L (ref 0–44)
AST: 18 U/L (ref 15–41)
Albumin: 3.6 g/dL (ref 3.5–5.0)
Alkaline Phosphatase: 86 U/L (ref 38–126)
Anion gap: 9 (ref 5–15)
BUN: 14 mg/dL (ref 8–23)
CO2: 24 mmol/L (ref 22–32)
Calcium: 9 mg/dL (ref 8.9–10.3)
Chloride: 104 mmol/L (ref 98–111)
Creatinine: 1 mg/dL (ref 0.61–1.24)
GFR, Est AFR Am: >60 mL/min (ref >60)
GFR, Estimated: >60 mL/min (ref >60)
Glucose, Bld: 141 mg/dL — ABNORMAL HIGH (ref 70–99)
Potassium: 3.6 mmol/L (ref 3.5–5.1)
Sodium: 137 mmol/L (ref 135–145)
Total Bilirubin: 0.9 mg/dL (ref 0.3–1.2)
Total Protein: 6.9 g/dL (ref 6.5–8.1)

## 2018-10-07 LAB — CBC WITH DIFFERENTIAL (CANCER CENTER ONLY)
Abs Immature Granulocytes: 0.01 10*3/uL (ref 0.00–0.07)
Basophils Absolute: 0.1 10*3/uL (ref 0.0–0.1)
Basophils Relative: 1 %
Eosinophils Absolute: 0.2 10*3/uL (ref 0.0–0.5)
Eosinophils Relative: 3 %
HCT: 42.9 % (ref 39.0–52.0)
Hemoglobin: 14.3 g/dL (ref 13.0–17.0)
Immature Granulocytes: 0 %
Lymphocytes Relative: 36 %
Lymphs Abs: 2.3 10*3/uL (ref 0.7–4.0)
MCH: 28.9 pg (ref 26.0–34.0)
MCHC: 33.3 g/dL (ref 30.0–36.0)
MCV: 86.7 fL (ref 80.0–100.0)
Monocytes Absolute: 0.6 10*3/uL (ref 0.1–1.0)
Monocytes Relative: 9 %
Neutro Abs: 3.2 10*3/uL (ref 1.7–7.7)
Neutrophils Relative %: 51 %
Platelet Count: 174 10*3/uL (ref 150–400)
RBC: 4.95 MIL/uL (ref 4.22–5.81)
RDW: 13.2 % (ref 11.5–15.5)
WBC Count: 6.3 10*3/uL (ref 4.0–10.5)
nRBC: 0 % (ref 0.0–0.2)

## 2018-10-07 LAB — TSH: TSH: 1.989 u[IU]/mL (ref 0.320–4.118)

## 2018-10-07 MED ORDER — SODIUM CHLORIDE 0.9 % IV SOLN
Freq: Once | INTRAVENOUS | Status: AC
  Administered 2018-10-07: 11:00:00 via INTRAVENOUS
  Filled 2018-10-07: qty 250

## 2018-10-07 MED ORDER — SODIUM CHLORIDE 0.9 % IV SOLN
200.0000 mg | Freq: Once | INTRAVENOUS | Status: AC
  Administered 2018-10-07: 200 mg via INTRAVENOUS
  Filled 2018-10-07: qty 8

## 2018-10-07 NOTE — Progress Notes (Signed)
[place] Telephone:([phone]) [phone]   Fax:([phone]) [phone]  OFFICE PROGRESS NOTE  Sheryle Hail, PA-C [address] [place] [address]  DIAGNOSIS: Stage IV (T2a,N3,M1c)non-small cell lung cancer, squamous cell carcinoma presented with right upper lobe lung mass in addition to right hilar and mediastinal adenopathy as well as metastatic disease in the medial right thigh diagnosed in November 2018.  PRIOR THERAPY:  1) Palliative radiotherapy to the right lower extremity mass completed April 09, 2017. 2) Systemic chemotherapy with carboplatin for AUC of 5, paclitaxel 175 mg/M2 and Keytruda 200 mg IV every 3 weeks.  First dose March 12, 2017, status post 4 cycles with partial response.  CURRENT THERAPY: Maintenance treatment with single agent Keytruda 200 mg IV every 3 weeks.  First cycle June 11, 2017.  Status post 27 cycles.  INTERVAL HISTORY: Aaron Sellers 68 y.o. male returns to the clinic today for follow-up visit.  The patient is feeling fine today with no concerning complaints.  He denied having any chest pain, shortness of breath, cough or hemoptysis.  He denied having any fever or chills.  He has no nausea, vomiting, diarrhea or constipation.  He has no significant weight loss or night sweats.  He continues to tolerate his treatment with Keytruda fairly well.  He is here today for evaluation before starting cycle #28.  MEDICAL HISTORY: Past Medical History:  Diagnosis Date  . Arthritis   . Atrial flutter ([place])   . BPH (benign prostatic hypertrophy) with urinary retention   . Cancer ([place])   . Chronic kidney disease    hx of kidney stones  2011  . COPD (chronic obstructive pulmonary disease) ([place])    has been smoking since he was 22 ish--  . High cholesterol   . Hypertension    dx around 2011  . Squamous cell carcinoma of right lung ([facility])     ALLERGIES:  is allergic to ciprofloxacin; adhesive [tape]; and codeine.  MEDICATIONS:  Current  Outpatient Medications  Medication Sig Dispense Refill  . acetaminophen (TYLENOL) 500 MG tablet Take 1,000 mg by mouth every 6 (six) hours as needed for moderate pain.    Marland Kitchen atorvastatin (LIPITOR) 10 MG tablet Take 10 mg by mouth every evening.     Marland Kitchen CARTIA XT 180 MG 24 hr capsule Take 1 capsule by mouth once daily 90 capsule 0  . Cyanocobalamin (VITAMIN B-12 PO) Take 1 tablet by mouth daily.    . famotidine (PEPCID) 10 MG tablet Take 10 mg by mouth as needed for heartburn or indigestion.    . furosemide (LASIX) 40 MG tablet Take 1 tablet (40 mg total) by mouth daily as needed. 90 tablet 3  . gabapentin (NEURONTIN) 300 MG capsule TAKE 1 CAPSULE BY MOUTH THREE TIMES DAILY (Patient taking differently: 300 mg 2 (two) times daily. ) 90 capsule 0  . loratadine (CLARITIN) 10 MG tablet Take 10 mg by mouth daily.    . tamsulosin (FLOMAX) 0.4 MG CAPS capsule Take 0.4 mg by mouth daily.     No current facility-administered medications for this visit.    Facility-Administered Medications Ordered in Other Visits  Medication Dose Route Frequency Provider Last Rate Last Dose  . heparin lock flush 100 unit/mL  500 Units Intracatheter Once PRN Curt Bears, MD      . sodium chloride flush (NS) 0.9 % injection 10 mL  10 mL Intracatheter PRN Curt Bears, MD        SURGICAL HISTORY:  Past Surgical History:  Procedure Laterality Date  . HERNIA REPAIR     umbilical hernia  05/6832  . INCISION AND DRAINAGE ABSCESS     "down the middle" of his buttocks  2015  . TOTAL KNEE ARTHROPLASTY Left 10/29/2014   Procedure: TOTAL KNEE ARTHROPLASTY;  Surgeon: Dorna Leitz, MD;  Location: [facility];  Service: Orthopedics;  Laterality: Left;    REVIEW OF SYSTEMS:  A comprehensive review of systems was negative.   PHYSICAL EXAMINATION: General appearance: alert, cooperative and no distress Head: Normocephalic, without obvious abnormality, atraumatic Neck: no adenopathy, no JVD, supple, symmetrical, trachea midline and  thyroid not enlarged, symmetric, no tenderness/mass/nodules Lymph nodes: Cervical, supraclavicular, and axillary nodes normal. Resp: clear to auscultation bilaterally Back: symmetric, no curvature. ROM normal. No CVA tenderness. Cardio: regular rate and rhythm, S1, S2 normal, no murmur, click, rub or gallop GI: soft, non-tender; bowel sounds normal; no masses,  no organomegaly Extremities: extremities normal, atraumatic, no cyanosis or edema  ECOG PERFORMANCE STATUS: 1 - Symptomatic but completely ambulatory  Blood pressure 124/75, pulse (!) 55, temperature 98.9 F (37.2 C), temperature source Oral, resp. rate 18, height 6\' 8"  (2.032 m), weight 284 lb 1.6 oz (128.9 kg), SpO2 95 %.  LABORATORY DATA: Lab Results  Component Value Date   WBC 6.3 10/07/2018   HGB 14.3 10/07/2018   HCT 42.9 10/07/2018   MCV 86.7 10/07/2018   PLT 174 10/07/2018      Chemistry      Component Value Date/Time   NA 137 10/07/2018 1021   NA 137 04/03/2017 1030   K 3.6 10/07/2018 1021   K 4.1 04/03/2017 1030   CL 104 10/07/2018 1021   CO2 24 10/07/2018 1021   CO2 27 04/03/2017 1030   BUN 14 10/07/2018 1021   BUN 15.5 04/03/2017 1030   CREATININE 1.00 10/07/2018 1021   CREATININE 0.8 04/03/2017 1030      Component Value Date/Time   CALCIUM 9.0 10/07/2018 1021   CALCIUM 9.4 04/03/2017 1030   ALKPHOS 86 10/07/2018 1021   ALKPHOS 97 04/03/2017 1030   AST 18 10/07/2018 1021   AST 14 04/03/2017 1030   ALT 16 10/07/2018 1021   ALT 21 04/03/2017 1030   BILITOT 0.9 10/07/2018 1021   BILITOT 0.67 04/03/2017 1030       RADIOGRAPHIC STUDIES: No results found.  ASSESSMENT AND PLAN: This is a very pleasant 68 years old white male with a stage IV non-small cell lung cancer, squamous cell carcinoma.  The patient is currently undergoing systemic chemotherapy with carboplatin, paclitaxel and Keytruda status post 4 cycles with partial response. He is currently undergoing maintenance treatment with single  agent Keytruda status post 27 cycles. The patient continues to tolerate his treatment well with no concerning adverse effects. I recommended for him to proceed with cycle #28 today as planned. I will see him back for follow-up visit in 3 weeks for evaluation before starting cycle #29. He was advised to call immediately if he has any concerning symptoms in the interval. The patient voices understanding of current disease status and treatment options and is in agreement with the current care plan. All questions were answered. The patient knows to call the clinic with any problems, questions or concerns. We can certainly see the patient much sooner if necessary.  Disclaimer: This note was dictated with voice recognition software. Similar sounding words can inadvertently be transcribed and may not be corrected upon review.

## 2018-10-07 NOTE — Patient Instructions (Signed)
[facility] Discharge Instructions for Patients Receiving Chemotherapy  Today you received the following chemotherapy agents:  Keytruda.  To help prevent nausea and vomiting after your treatment, we encourage you to take your nausea medication as directed.   If you develop nausea and vomiting that is not controlled by your nausea medication, call the clinic.   BELOW ARE SYMPTOMS THAT SHOULD BE REPORTED IMMEDIATELY:  *FEVER GREATER THAN 100.5 F  *CHILLS WITH OR WITHOUT FEVER  NAUSEA AND VOMITING THAT IS NOT CONTROLLED WITH YOUR NAUSEA MEDICATION  *UNUSUAL SHORTNESS OF BREATH  *UNUSUAL BRUISING OR BLEEDING  TENDERNESS IN MOUTH AND THROAT WITH OR WITHOUT PRESENCE OF ULCERS  *URINARY PROBLEMS  *BOWEL PROBLEMS  UNUSUAL RASH Items with * indicate a potential emergency and should be followed up as soon as possible.  Feel free to call the clinic should you have any questions or concerns. The clinic phone number is [phone].  Please show the CHEMO ALERT CARD at check-in to the Emergency Department and triage nurse.    

## 2018-10-08 ENCOUNTER — Telehealth: Payer: Self-pay | Admitting: Internal Medicine

## 2018-10-08 NOTE — Telephone Encounter (Signed)
Scheduled appt per 7/7 los - added additional cycles per 7/07 - pt to get an updated schedule next visit.

## 2018-10-14 ENCOUNTER — Other Ambulatory Visit: Payer: Self-pay | Admitting: Internal Medicine

## 2018-10-14 DIAGNOSIS — G6289 Other specified polyneuropathies: Secondary | ICD-10-CM

## 2018-10-28 ENCOUNTER — Encounter: Payer: Self-pay | Admitting: Internal Medicine

## 2018-10-28 ENCOUNTER — Inpatient Hospital Stay (HOSPITAL_BASED_OUTPATIENT_CLINIC_OR_DEPARTMENT_OTHER): Payer: Medicare Other | Admitting: Internal Medicine

## 2018-10-28 ENCOUNTER — Inpatient Hospital Stay: Payer: Medicare Other

## 2018-10-28 ENCOUNTER — Telehealth: Payer: Self-pay | Admitting: Internal Medicine

## 2018-10-28 ENCOUNTER — Other Ambulatory Visit: Payer: Self-pay

## 2018-10-28 VITALS — BP 138/83 | HR 58 | Temp 98.7°F | Resp 18 | Ht >= 80 in | Wt 278.2 lb

## 2018-10-28 DIAGNOSIS — C3491 Malignant neoplasm of unspecified part of right bronchus or lung: Secondary | ICD-10-CM

## 2018-10-28 DIAGNOSIS — E7800 Pure hypercholesterolemia, unspecified: Secondary | ICD-10-CM

## 2018-10-28 DIAGNOSIS — C3411 Malignant neoplasm of upper lobe, right bronchus or lung: Secondary | ICD-10-CM | POA: Diagnosis not present

## 2018-10-28 DIAGNOSIS — N189 Chronic kidney disease, unspecified: Secondary | ICD-10-CM

## 2018-10-28 DIAGNOSIS — Z79899 Other long term (current) drug therapy: Secondary | ICD-10-CM

## 2018-10-28 DIAGNOSIS — Z5112 Encounter for antineoplastic immunotherapy: Secondary | ICD-10-CM

## 2018-10-28 DIAGNOSIS — I10 Essential (primary) hypertension: Secondary | ICD-10-CM | POA: Diagnosis not present

## 2018-10-28 DIAGNOSIS — Z87891 Personal history of nicotine dependence: Secondary | ICD-10-CM

## 2018-10-28 DIAGNOSIS — Z923 Personal history of irradiation: Secondary | ICD-10-CM | POA: Diagnosis not present

## 2018-10-28 DIAGNOSIS — C3490 Malignant neoplasm of unspecified part of unspecified bronchus or lung: Secondary | ICD-10-CM

## 2018-10-28 DIAGNOSIS — R5382 Chronic fatigue, unspecified: Secondary | ICD-10-CM

## 2018-10-28 DIAGNOSIS — M1990 Unspecified osteoarthritis, unspecified site: Secondary | ICD-10-CM

## 2018-10-28 DIAGNOSIS — I4892 Unspecified atrial flutter: Secondary | ICD-10-CM

## 2018-10-28 DIAGNOSIS — J449 Chronic obstructive pulmonary disease, unspecified: Secondary | ICD-10-CM

## 2018-10-28 LAB — CBC WITH DIFFERENTIAL (CANCER CENTER ONLY)
Abs Immature Granulocytes: 0.01 10*3/uL (ref 0.00–0.07)
Basophils Absolute: 0.1 10*3/uL (ref 0.0–0.1)
Basophils Relative: 1 %
Eosinophils Absolute: 0.2 10*3/uL (ref 0.0–0.5)
Eosinophils Relative: 4 %
HCT: 45.8 % (ref 39.0–52.0)
Hemoglobin: 15.1 g/dL (ref 13.0–17.0)
Immature Granulocytes: 0 %
Lymphocytes Relative: 39 %
Lymphs Abs: 2.7 10*3/uL (ref 0.7–4.0)
MCH: 28.5 pg (ref 26.0–34.0)
MCHC: 33 g/dL (ref 30.0–36.0)
MCV: 86.6 fL (ref 80.0–100.0)
Monocytes Absolute: 0.6 10*3/uL (ref 0.1–1.0)
Monocytes Relative: 9 %
Neutro Abs: 3.2 10*3/uL (ref 1.7–7.7)
Neutrophils Relative %: 47 %
Platelet Count: 181 10*3/uL (ref 150–400)
RBC: 5.29 MIL/uL (ref 4.22–5.81)
RDW: 13.1 % (ref 11.5–15.5)
WBC Count: 6.8 10*3/uL (ref 4.0–10.5)
nRBC: 0 % (ref 0.0–0.2)

## 2018-10-28 LAB — CMP (CANCER CENTER ONLY)
ALT: 17 U/L (ref 0–44)
AST: 18 U/L (ref 15–41)
Albumin: 3.9 g/dL (ref 3.5–5.0)
Alkaline Phosphatase: 91 U/L (ref 38–126)
Anion gap: 8 (ref 5–15)
BUN: 16 mg/dL (ref 8–23)
CO2: 26 mmol/L (ref 22–32)
Calcium: 9.7 mg/dL (ref 8.9–10.3)
Chloride: 105 mmol/L (ref 98–111)
Creatinine: 0.88 mg/dL (ref 0.61–1.24)
GFR, Est AFR Am: >60 mL/min (ref >60)
GFR, Estimated: >60 mL/min (ref >60)
Glucose, Bld: 105 mg/dL — ABNORMAL HIGH (ref 70–99)
Potassium: 3.9 mmol/L (ref 3.5–5.1)
Sodium: 139 mmol/L (ref 135–145)
Total Bilirubin: 0.9 mg/dL (ref 0.3–1.2)
Total Protein: 7.4 g/dL (ref 6.5–8.1)

## 2018-10-28 LAB — TSH: TSH: 2.83 u[IU]/mL (ref 0.320–4.118)

## 2018-10-28 MED ORDER — SODIUM CHLORIDE 0.9 % IV SOLN
200.0000 mg | Freq: Once | INTRAVENOUS | Status: AC
  Administered 2018-10-28: 200 mg via INTRAVENOUS
  Filled 2018-10-28: qty 8

## 2018-10-28 MED ORDER — SODIUM CHLORIDE 0.9 % IV SOLN
Freq: Once | INTRAVENOUS | Status: AC
  Administered 2018-10-28: 10:00:00 via INTRAVENOUS
  Filled 2018-10-28: qty 250

## 2018-10-28 NOTE — Telephone Encounter (Signed)
Per 7/28 los appt already scheduled.  Printed calendar and avs.  Patient stated he will come back by to pick up contrast.

## 2018-10-28 NOTE — Patient Instructions (Signed)
[facility] Discharge Instructions for Patients Receiving Chemotherapy  Today you received the following chemotherapy agents: Pembrolizumab (Keytruda)  To help prevent nausea and vomiting after your treatment, we encourage you to take your nausea medication as directed.   If you develop nausea and vomiting that is not controlled by your nausea medication, call the clinic.   BELOW ARE SYMPTOMS THAT SHOULD BE REPORTED IMMEDIATELY:  *FEVER GREATER THAN 100.5 F  *CHILLS WITH OR WITHOUT FEVER  NAUSEA AND VOMITING THAT IS NOT CONTROLLED WITH YOUR NAUSEA MEDICATION  *UNUSUAL SHORTNESS OF BREATH  *UNUSUAL BRUISING OR BLEEDING  TENDERNESS IN MOUTH AND THROAT WITH OR WITHOUT PRESENCE OF ULCERS  *URINARY PROBLEMS  *BOWEL PROBLEMS  UNUSUAL RASH Items with * indicate a potential emergency and should be followed up as soon as possible.  Feel free to call the clinic should you have any questions or concerns. The clinic phone number is [phone].  Please show the CHEMO ALERT CARD at check-in to the Emergency Department and triage nurse.  Coronavirus (COVID-19) Are you at risk?  Are you at risk for the Coronavirus (COVID-19)?  To be considered HIGH RISK for Coronavirus (COVID-19), you have to meet the following criteria:  . Traveled to [place], [place], [place], [place] or [place]; or in the [place] to [place], [place], [place], or [place]; and have fever, cough, and shortness of breath within the last 2 weeks of travel OR . Been in close contact with a person diagnosed with COVID-19 within the last 2 weeks and have fever, cough, and shortness of breath . IF YOU DO NOT MEET THESE CRITERIA, YOU ARE CONSIDERED LOW RISK FOR COVID-19.  What to do if you are HIGH RISK for COVID-19?  . If you are having a medical emergency, call 911. . Seek medical care right away. Before you go to a doctor's office, urgent care or emergency department, call ahead and tell them  about your recent travel, contact with someone diagnosed with COVID-19, and your symptoms. You should receive instructions from your physician's office regarding next steps of care.  . When you arrive at healthcare provider, tell the healthcare staff immediately you have returned from visiting [place], [place], [place], [place] or [place]; or traveled in the [place] to [place], [place], [place], or [place]; in the last two weeks or you have been in close contact with a person diagnosed with COVID-19 in the last 2 weeks.   . Tell the health care staff about your symptoms: fever, cough and shortness of breath. . After you have been seen by a medical provider, you will be either: o Tested for (COVID-19) and discharged home on quarantine except to seek medical care if symptoms worsen, and asked to  - Stay home and avoid contact with others until you get your results (4-5 days)  - Avoid travel on public transportation if possible (such as bus, train, or airplane) or o Sent to the Emergency Department by EMS for evaluation, COVID-19 testing, and possible admission depending on your condition and test results.  What to do if you are LOW RISK for COVID-19?  Reduce your risk of any infection by using the same precautions used for avoiding the common cold or flu:  . Wash your hands often with soap and warm water for at least 20 seconds.  If soap and water are not readily available, use an alcohol-based hand sanitizer with at least 60% alcohol.  . If coughing or   sneezing, cover your mouth and nose by coughing or sneezing into the elbow areas of your shirt or coat, into a tissue or into your sleeve (not your hands). . Avoid shaking hands with others and consider head nods or verbal greetings only. . Avoid touching your eyes, nose, or mouth with unwashed hands.  . Avoid close contact with people who are sick. . Avoid places or events with large numbers of people in one location, like concerts or  sporting events. . Carefully consider travel plans you have or are making. . If you are planning any travel outside or inside the [place], visit the CDC's Travelers' Health webpage for the latest health notices. . If you have some symptoms but not all symptoms, continue to monitor at home and seek medical attention if your symptoms worsen. . If you are having a medical emergency, call 911.   ADDITIONAL HEALTHCARE OPTIONS FOR PATIENTS   Telehealth / e-Visit: [email or web address]         [facility]: [phone]  [facility]: [phone]                   [facility]: [phone]   

## 2018-10-28 NOTE — Progress Notes (Signed)
[facility] [phone]   [phone]) [phone]  OFFICE PROGRESS NOTE  Sheryle Hail, PA-C [address] [place] [address]  DIAGNOSIS: Stage IV (T2a,N3,M1c)non-small cell lung cancer, squamous cell carcinoma presented with right upper lobe lung mass in addition to right hilar and mediastinal adenopathy as well as metastatic disease in the medial right thigh diagnosed in November 2018.  PRIOR THERAPY:  1) Palliative radiotherapy to the right lower extremity mass completed April 09, 2017. 2) Systemic chemotherapy with carboplatin for AUC of 5, paclitaxel 175 mg/M2 and Keytruda 200 mg IV every 3 weeks.  First dose March 12, 2017, status post 4 cycles with partial response.  CURRENT THERAPY: Maintenance treatment with single agent Keytruda 200 mg IV every 3 weeks.  First cycle June 11, 2017.  Status post 28 cycles.  INTERVAL HISTORY: Aaron Sellers 68 y.o. male returns to the clinic today for follow-up visit.  The patient is feeling fine today with no concerning complaints except for intermittent right lower abdomen pain improved after passing gas.  He denied having any nausea, vomiting, diarrhea or constipation.  He denied having any fever or chills.  He has no chest pain, shortness of breath, cough or hemoptysis.  He has no headache or visual changes.  He lost few pounds since his last visit.  He has been tolerating this treatment well.  Is here for evaluation before starting cycle #29.  MEDICAL HISTORY: Past Medical History:  Diagnosis Date  . Arthritis   . Atrial flutter ([facility])   . BPH (benign prostatic hypertrophy) with urinary retention   . Cancer ([facility])   . Chronic kidney disease    hx of kidney stones  2011  . COPD (chronic obstructive pulmonary disease) ([facility])    has been smoking since he was 22 ish--  . High cholesterol   . Hypertension    dx around 2011  . Squamous cell carcinoma of right lung ([facility])     ALLERGIES:  is allergic  to ciprofloxacin; adhesive [tape]; and codeine.  MEDICATIONS:  Current Outpatient Medications  Medication Sig Dispense Refill  . acetaminophen (TYLENOL) 500 MG tablet Take 1,000 mg by mouth every 6 (six) hours as needed for moderate pain.    Marland Kitchen atorvastatin (LIPITOR) 10 MG tablet Take 10 mg by mouth every evening.     Marland Kitchen CARTIA XT 180 MG 24 hr capsule Take 1 capsule by mouth once daily 90 capsule 0  . Cyanocobalamin (VITAMIN B-12 PO) Take 1 tablet by mouth daily.    . famotidine (PEPCID) 10 MG tablet Take 10 mg by mouth as needed for heartburn or indigestion.    . furosemide (LASIX) 40 MG tablet Take 1 tablet (40 mg total) by mouth daily as needed. 90 tablet 3  . gabapentin (NEURONTIN) 300 MG capsule TAKE 1 CAPSULE BY MOUTH THREE TIMES DAILY 90 capsule 0  . loratadine (CLARITIN) 10 MG tablet Take 10 mg by mouth daily.    . tamsulosin (FLOMAX) 0.4 MG CAPS capsule Take 0.4 mg by mouth daily.     No current facility-administered medications for this visit.    Facility-Administered Medications Ordered in Other Visits  Medication Dose Route Frequency Provider Last Rate Last Dose  . heparin lock flush 100 unit/mL  500 Units Intracatheter Once PRN Curt Bears, MD      . sodium chloride flush (NS) 0.9 % injection 10 mL  10 mL Intracatheter PRN Curt Bears, MD  SURGICAL HISTORY:  Past Surgical History:  Procedure Laterality Date  . HERNIA REPAIR     umbilical hernia  09/348  . INCISION AND DRAINAGE ABSCESS     "down the middle" of his buttocks  2015  . TOTAL KNEE ARTHROPLASTY Left 10/29/2014   Procedure: TOTAL KNEE ARTHROPLASTY;  Surgeon: Dorna Leitz, MD;  Location: [facility];  Service: Orthopedics;  Laterality: Left;    REVIEW OF SYSTEMS:  A comprehensive review of systems was negative.   PHYSICAL EXAMINATION: General appearance: alert, cooperative and no distress Head: Normocephalic, without obvious abnormality, atraumatic Neck: no adenopathy, no JVD, supple, symmetrical,  trachea midline and thyroid not enlarged, symmetric, no tenderness/mass/nodules Lymph nodes: Cervical, supraclavicular, and axillary nodes normal. Resp: clear to auscultation bilaterally Back: symmetric, no curvature. ROM normal. No CVA tenderness. Cardio: regular rate and rhythm, S1, S2 normal, no murmur, click, rub or gallop GI: soft, non-tender; bowel sounds normal; no masses,  no organomegaly Extremities: extremities normal, atraumatic, no cyanosis or edema  ECOG PERFORMANCE STATUS: 1 - Symptomatic but completely ambulatory  Blood pressure 138/83, pulse (!) 58, temperature 98.7 F (37.1 C), temperature source Oral, resp. rate 18, height 6\' 8"  (2.032 m), weight 278 lb 3.2 oz (126.2 kg), SpO2 97 %.  LABORATORY DATA: Lab Results  Component Value Date   WBC 6.8 10/28/2018   HGB 15.1 10/28/2018   HCT 45.8 10/28/2018   MCV 86.6 10/28/2018   PLT 181 10/28/2018      Chemistry      Component Value Date/Time   NA 137 10/07/2018 1021   NA 137 04/03/2017 1030   K 3.6 10/07/2018 1021   K 4.1 04/03/2017 1030   CL 104 10/07/2018 1021   CO2 24 10/07/2018 1021   CO2 27 04/03/2017 1030   BUN 14 10/07/2018 1021   BUN 15.5 04/03/2017 1030   CREATININE 1.00 10/07/2018 1021   CREATININE 0.8 04/03/2017 1030      Component Value Date/Time   CALCIUM 9.0 10/07/2018 1021   CALCIUM 9.4 04/03/2017 1030   ALKPHOS 86 10/07/2018 1021   ALKPHOS 97 04/03/2017 1030   AST 18 10/07/2018 1021   AST 14 04/03/2017 1030   ALT 16 10/07/2018 1021   ALT 21 04/03/2017 1030   BILITOT 0.9 10/07/2018 1021   BILITOT 0.67 04/03/2017 1030       RADIOGRAPHIC STUDIES: No results found.  ASSESSMENT AND PLAN: This is a very pleasant 68 years old white male with a stage IV non-small cell lung cancer, squamous cell carcinoma.  The patient is currently undergoing systemic chemotherapy with carboplatin, paclitaxel and Keytruda status post 4 cycles with partial response. He is currently undergoing maintenance  treatment with single agent Keytruda status post 28 cycles. He continues to tolerate his treatment well with no concerning complaints. I recommended for him to proceed with cycle #29 today as planned. I will see the patient back for follow-up visit in 3 weeks for evaluation before the next cycle of his treatment with repeat CT scan of the chest, abdomen and pelvis for restaging of his disease. The patient was advised to call immediately if he has any concerning symptoms in the interval. The patient voices understanding of current disease status and treatment options and is in agreement with the current care plan. All questions were answered. The patient knows to call the clinic with any problems, questions or concerns. We can certainly see the patient much sooner if necessary.  Disclaimer: This note was dictated with voice recognition software. Similar  sounding words can inadvertently be transcribed and may not be corrected upon review.

## 2018-11-17 ENCOUNTER — Ambulatory Visit (HOSPITAL_COMMUNITY)
Admission: RE | Admit: 2018-11-17 | Discharge: 2018-11-17 | Disposition: A | Discharge status: Home / Self Care | Payer: Medicare Other | Source: Ambulatory Visit | Attending: Internal Medicine | Admitting: Internal Medicine

## 2018-11-17 ENCOUNTER — Encounter (HOSPITAL_COMMUNITY): Payer: Self-pay

## 2018-11-17 ENCOUNTER — Other Ambulatory Visit: Payer: Self-pay

## 2018-11-17 DIAGNOSIS — C3490 Malignant neoplasm of unspecified part of unspecified bronchus or lung: Secondary | ICD-10-CM | POA: Diagnosis not present

## 2018-11-17 MED ORDER — SODIUM CHLORIDE (PF) 0.9 % IJ SOLN
INTRAMUSCULAR | Status: AC
  Filled 2018-11-17: qty 50

## 2018-11-17 MED ORDER — IOHEXOL 300 MG/ML  SOLN
100.0000 mL | Freq: Once | INTRAMUSCULAR | Status: AC | PRN
  Administered 2018-11-17: 100 mL via INTRAVENOUS

## 2018-11-18 ENCOUNTER — Inpatient Hospital Stay: Payer: Medicare Other

## 2018-11-18 ENCOUNTER — Inpatient Hospital Stay: Payer: Medicare Other | Attending: Oncology | Admitting: Internal Medicine

## 2018-11-18 ENCOUNTER — Other Ambulatory Visit: Payer: Self-pay

## 2018-11-18 ENCOUNTER — Encounter: Payer: Self-pay | Admitting: Internal Medicine

## 2018-11-18 VITALS — BP 133/78 | HR 66 | Temp 98.9°F | Resp 18 | Ht >= 80 in | Wt 281.1 lb

## 2018-11-18 DIAGNOSIS — R5382 Chronic fatigue, unspecified: Secondary | ICD-10-CM

## 2018-11-18 DIAGNOSIS — C3491 Malignant neoplasm of unspecified part of right bronchus or lung: Secondary | ICD-10-CM

## 2018-11-18 DIAGNOSIS — I129 Hypertensive chronic kidney disease with stage 1 through stage 4 chronic kidney disease, or unspecified chronic kidney disease: Secondary | ICD-10-CM | POA: Insufficient documentation

## 2018-11-18 DIAGNOSIS — N189 Chronic kidney disease, unspecified: Secondary | ICD-10-CM | POA: Insufficient documentation

## 2018-11-18 DIAGNOSIS — Z79899 Other long term (current) drug therapy: Secondary | ICD-10-CM | POA: Insufficient documentation

## 2018-11-18 DIAGNOSIS — C7951 Secondary malignant neoplasm of bone: Secondary | ICD-10-CM | POA: Insufficient documentation

## 2018-11-18 DIAGNOSIS — E7800 Pure hypercholesterolemia, unspecified: Secondary | ICD-10-CM | POA: Diagnosis not present

## 2018-11-18 DIAGNOSIS — I10 Essential (primary) hypertension: Secondary | ICD-10-CM

## 2018-11-18 DIAGNOSIS — J449 Chronic obstructive pulmonary disease, unspecified: Secondary | ICD-10-CM | POA: Insufficient documentation

## 2018-11-18 DIAGNOSIS — F17210 Nicotine dependence, cigarettes, uncomplicated: Secondary | ICD-10-CM | POA: Insufficient documentation

## 2018-11-18 DIAGNOSIS — Z5112 Encounter for antineoplastic immunotherapy: Secondary | ICD-10-CM

## 2018-11-18 DIAGNOSIS — C3411 Malignant neoplasm of upper lobe, right bronchus or lung: Secondary | ICD-10-CM | POA: Diagnosis not present

## 2018-11-18 LAB — CBC WITH DIFFERENTIAL (CANCER CENTER ONLY)
Abs Immature Granulocytes: 0.01 10*3/uL (ref 0.00–0.07)
Basophils Absolute: 0.1 10*3/uL (ref 0.0–0.1)
Basophils Relative: 1 %
Eosinophils Absolute: 0.2 10*3/uL (ref 0.0–0.5)
Eosinophils Relative: 3 %
HCT: 44.8 % (ref 39.0–52.0)
Hemoglobin: 14.9 g/dL (ref 13.0–17.0)
Immature Granulocytes: 0 %
Lymphocytes Relative: 32 %
Lymphs Abs: 1.8 10*3/uL (ref 0.7–4.0)
MCH: 28.5 pg (ref 26.0–34.0)
MCHC: 33.3 g/dL (ref 30.0–36.0)
MCV: 85.7 fL (ref 80.0–100.0)
Monocytes Absolute: 0.5 10*3/uL (ref 0.1–1.0)
Monocytes Relative: 8 %
Neutro Abs: 3.2 10*3/uL (ref 1.7–7.7)
Neutrophils Relative %: 56 %
Platelet Count: 177 10*3/uL (ref 150–400)
RBC: 5.23 MIL/uL (ref 4.22–5.81)
RDW: 13.2 % (ref 11.5–15.5)
WBC Count: 5.7 10*3/uL (ref 4.0–10.5)
nRBC: 0 % (ref 0.0–0.2)

## 2018-11-18 LAB — CMP (CANCER CENTER ONLY)
ALT: 16 U/L (ref 0–44)
AST: 15 U/L (ref 15–41)
Albumin: 3.7 g/dL (ref 3.5–5.0)
Alkaline Phosphatase: 88 U/L (ref 38–126)
Anion gap: 10 (ref 5–15)
BUN: 11 mg/dL (ref 8–23)
CO2: 24 mmol/L (ref 22–32)
Calcium: 9 mg/dL (ref 8.9–10.3)
Chloride: 106 mmol/L (ref 98–111)
Creatinine: 0.84 mg/dL (ref 0.61–1.24)
GFR, Est AFR Am: >60 mL/min (ref >60)
GFR, Estimated: >60 mL/min (ref >60)
Glucose, Bld: 155 mg/dL — ABNORMAL HIGH (ref 70–99)
Potassium: 3.8 mmol/L (ref 3.5–5.1)
Sodium: 140 mmol/L (ref 135–145)
Total Bilirubin: 0.9 mg/dL (ref 0.3–1.2)
Total Protein: 6.7 g/dL (ref 6.5–8.1)

## 2018-11-18 LAB — TSH: TSH: 2.282 u[IU]/mL (ref 0.320–4.118)

## 2018-11-18 IMAGING — CT CT CHEST W/ CM
2 of 5 series · 12 of 36 positions shown, 15 images · IV contrast (iopamidol)
Comparison: CT 07/22/2017 and 05/20/2017.

CLINICAL DATA: Stage IV squamous cell carcinoma of the right lung
diagnosed 8 months ago. Chemotherapy ongoing.

EXAM:
CT CHEST, ABDOMEN, AND PELVIS WITH CONTRAST
TECHNIQUE: Multidetector CT imaging of the chest, abdomen and pelvis was
performed following the standard protocol during bolus
administration of intravenous contrast.
CONTRAST:  100mL [id number] IOPAMIDOL ([id number]) INJECTION 61%

[Series 2: cap with · axial · 0.98mm/px · z∈[-713,-103]mm · 9 of 150 slices shown, 12 images]
[im 14/150  mediastinal]
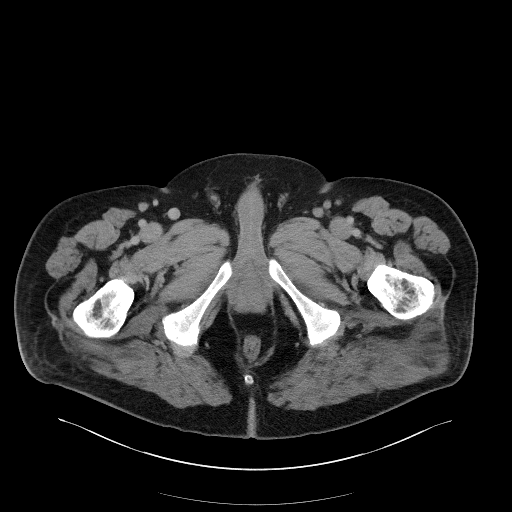
[im 14/150  lung]
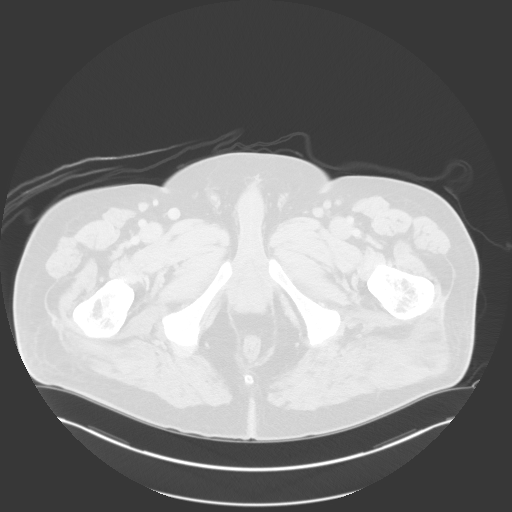
[im 28/150  lung]
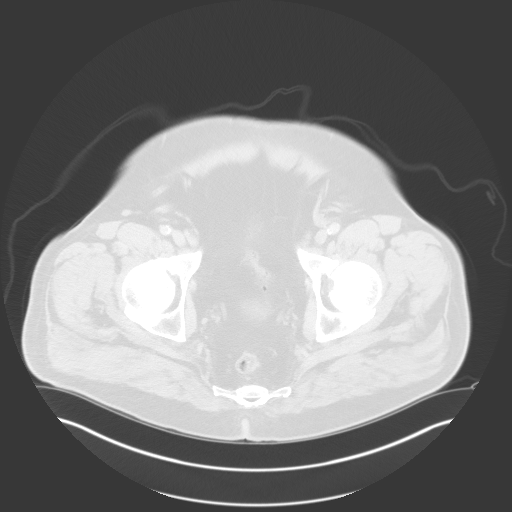
[im 41/150  lung]
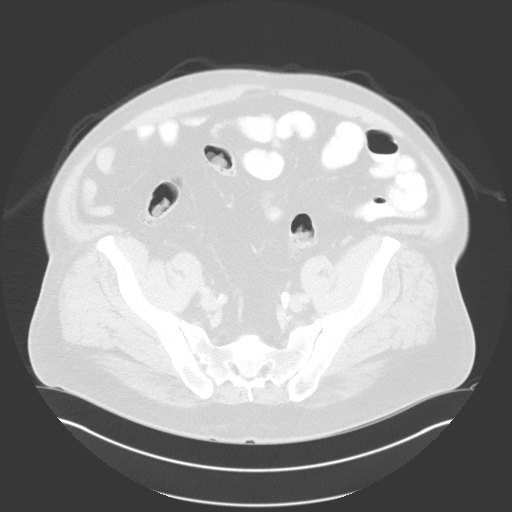
[im 55/150  lung]
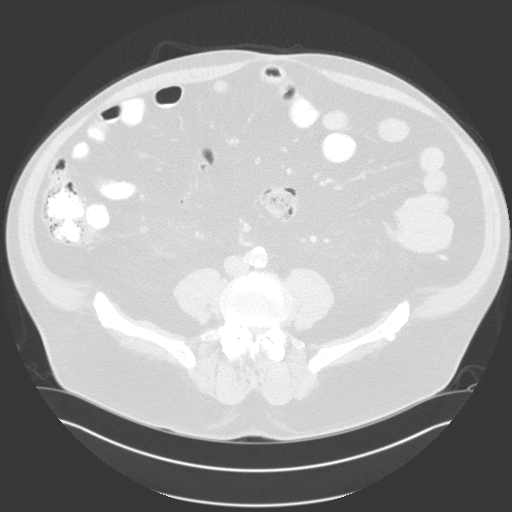
[im 82/150  mediastinal]
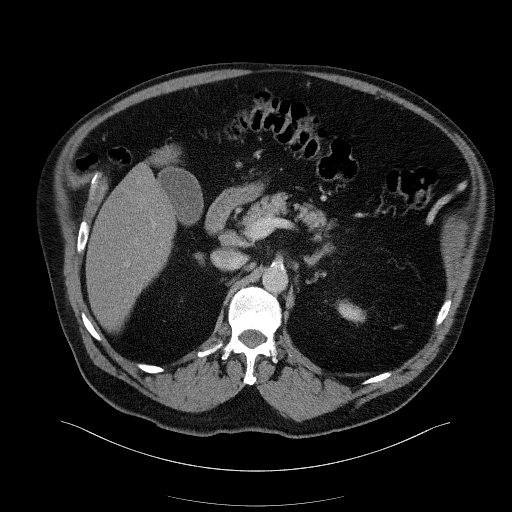
[im 82/150  lung]
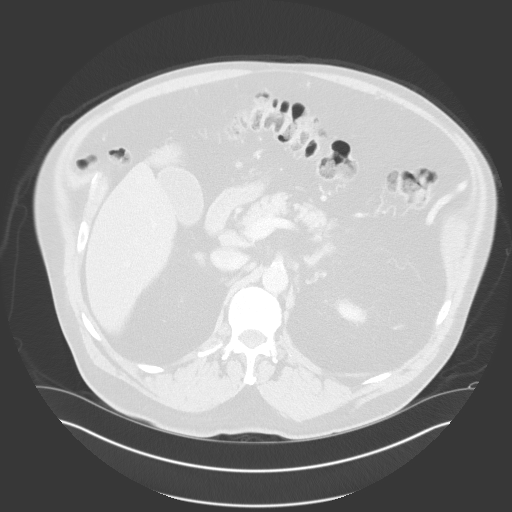
[im 95/150  lung]
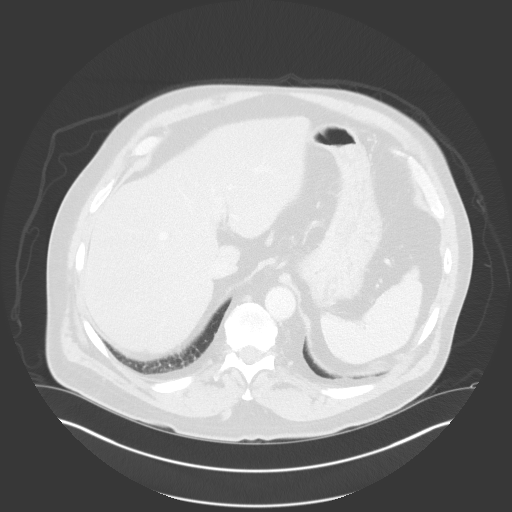
[im 109/150  lung]
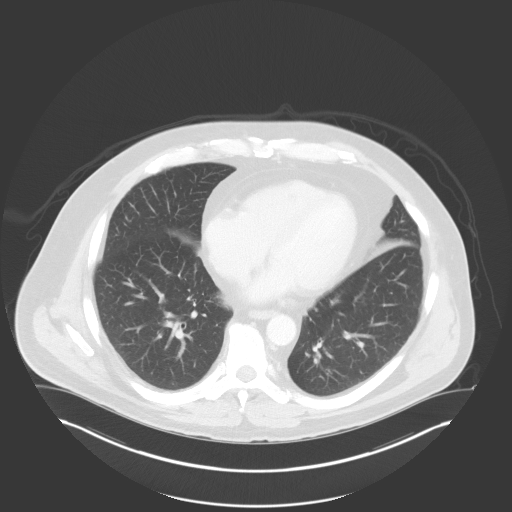
[im 122/150  lung]
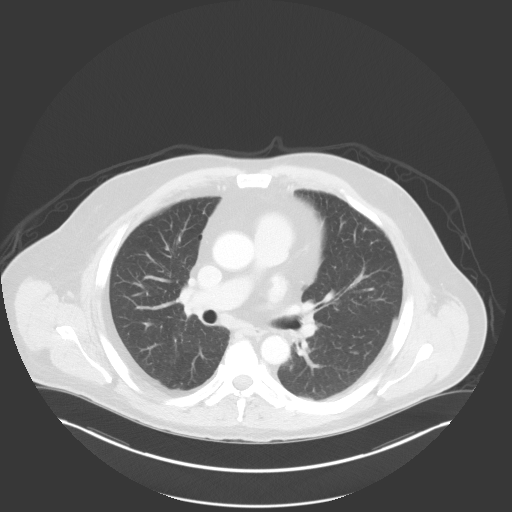
[im 136/150  mediastinal]
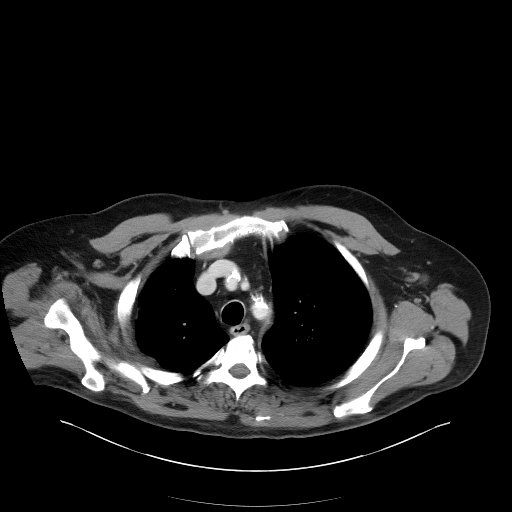
[im 136/150  lung]
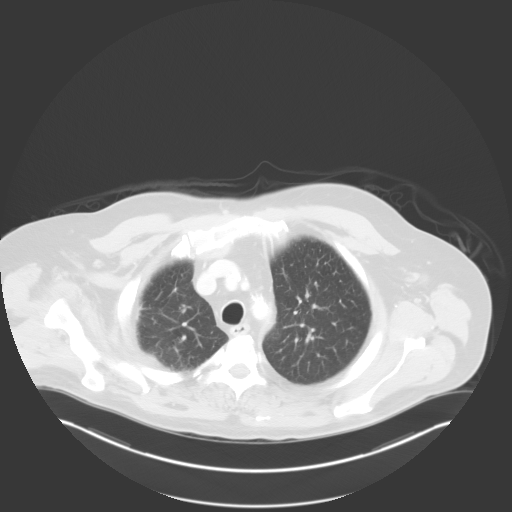

[Series 4: coronals · coronal · 1.01mm/px · 3 of 205 slices shown]
[im 41/205  lung]
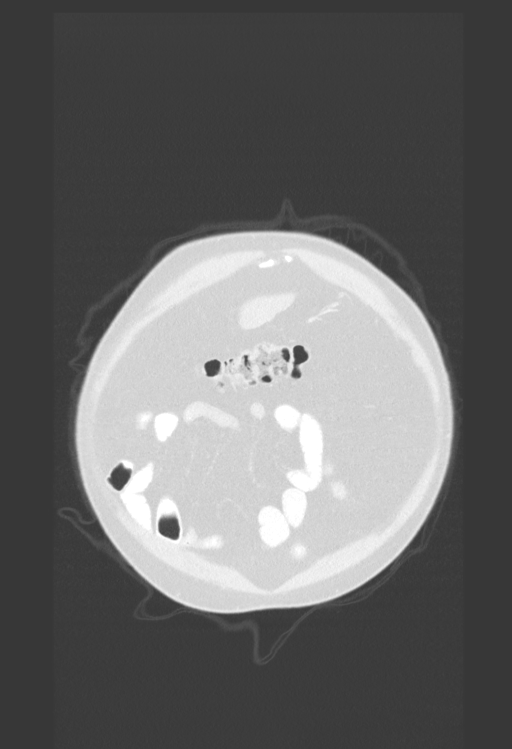
[im 82/205  lung]
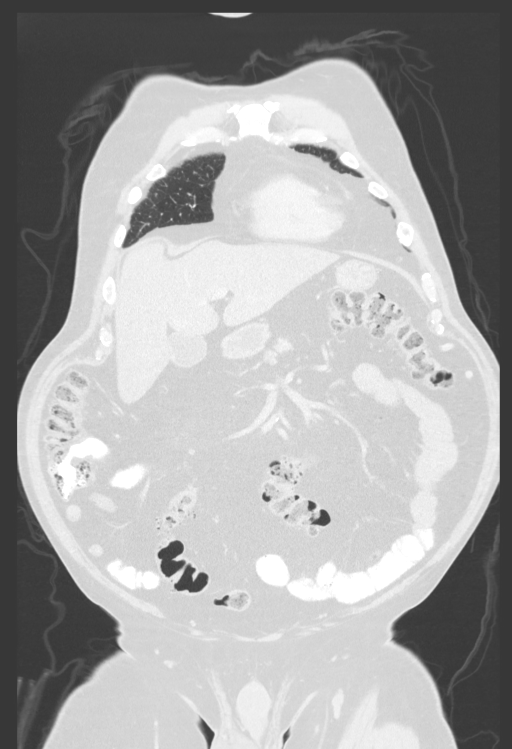
[im 123/205  lung]
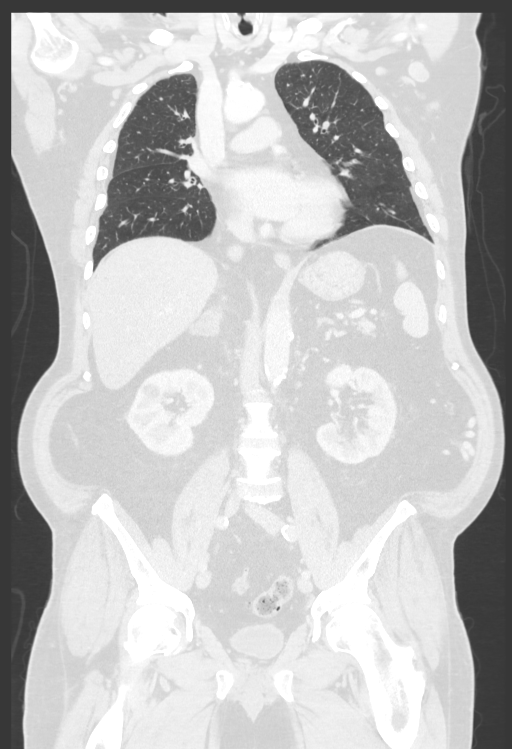

[12 of 36 positions shown; findings below may reference images not displayed]

FINDINGS: CT CHEST FINDINGS

Cardiovascular: Atherosclerosis of the aorta, great vessels and
coronary arteries again noted. No acute vascular findings are seen.
The heart size is normal. There is no pericardial effusion.

Mediastinum/Nodes: Patient has developed a mildly enlarged left
axillary node measuring 17 mm on image [DATE]. Small lymph nodes in
the right hilum and subcarinal regions are similar to the previous
studies, the latter measuring up to 12 mm short axis on image [DATE].
The thyroid gland, trachea and esophagus appear unremarkable.

Lungs/Pleura: There is no pleural effusion. There has been further
reduction in the size of the cavitary right upper lobe lesion,
measuring 17 x 11 mm on image 44/6 (previously 19 x 18 mm). Other
scattered small upper lobe nodules bilaterally are stable, most
prominent on images 38, 44 and 61/6. No new or enlarging pulmonary
nodules. There is underlying emphysema with mild central airway and
septal thickening.

Musculoskeletal/Chest wall: No chest wall mass or suspicious osseous
findings.

CT ABDOMEN AND PELVIS FINDINGS

Hepatobiliary: The liver is normal in density without focal
abnormality. No evidence of gallstones, gallbladder wall thickening
or biliary dilatation.

Pancreas: Unremarkable. No pancreatic ductal dilatation or
surrounding inflammatory changes.

Spleen: Normal in size without focal abnormality.

Adrenals/Urinary Tract: Stable 3.7 x 1.7 cm right adrenal nodule,
consistent with an adenoma based on previous evaluation. The left
adrenal gland appears normal. There are probable tiny nonobstructing
calculi in the interpolar regions of both kidneys. There is a 2.3 cm
cyst in the interpolar region of the right kidney which appears
stable. There is no hydronephrosis or delay in contrast excretion,
however, there is a persistent cluster of nonobstructing calculi at
the right ureterovesical junction, similar to the previous study.
These measure up to 6 mm in diameter on image 129/2. The bladder
appears normal.

Stomach/Bowel: No evidence of bowel wall thickening, distention or
surrounding inflammatory change. There are diverticular changes
throughout the distal colon. The appendix appears normal.

Vascular/Lymphatic: There are stable mildly prominent lymph nodes in
the upper abdomen, including a 15 mm portacaval node on image 70.
Small left external iliac and inguinal lymph nodes are stable.
Aortic and branch vessel atherosclerosis with stable mild dilatation
of the mid aorta to 3.0 cm. No acute vascular findings. Stable left
abdominal venous collaterals.

Reproductive: The prostate gland and seminal vesicles appear
unremarkable. Possible small right scrotal hydrocele, incompletely
visualized.

Other: No evidence of abdominal wall mass or hernia. No ascites.

Musculoskeletal: No acute or significant osseous findings. Lower
lumbar spondylosis again noted with potentially symptomatic spinal
stenosis at L4-5. Mild foraminal narrowing at the lower 3 levels
appears stable. There is stable partial tearing of the left common
hamstring tendon.
IMPRESSION: 1. Further improvement in the cavitary right upper lobe lesion.
Additional small pulmonary nodules bilaterally are unchanged.
2. Interval moderate enlargement of a left axillary lymph node. This
could be inflammatory if the patient has had recent infection in the
left upper extremity. Attention on follow-up recommended to exclude
metastatic disease. This could be sampled under ultrasound guidance
if clinically warranted.
3. Other mildly prominent mediastinal and abdominal lymph nodes are
stable, and no other evidence of metastatic disease.
4. Persistent nonobstructing calculi at the right ureterovesical
junction.
5. Stable additional incidental findings including a right adrenal
adenoma, distal colonic diverticulosis, lumbar spondylosis and
Aortic Atherosclerosis ([id number]).

## 2018-11-18 MED ORDER — SODIUM CHLORIDE 0.9 % IV SOLN
200.0000 mg | Freq: Once | INTRAVENOUS | Status: AC
  Administered 2018-11-18: 12:00:00 200 mg via INTRAVENOUS
  Filled 2018-11-18: qty 8

## 2018-11-18 MED ORDER — SODIUM CHLORIDE 0.9 % IV SOLN
Freq: Once | INTRAVENOUS | Status: AC
  Administered 2018-11-18: 11:00:00 via INTRAVENOUS
  Filled 2018-11-18: qty 250

## 2018-11-18 NOTE — Progress Notes (Signed)
[facility] [phone]   Fax:([phone]) [phone]  OFFICE PROGRESS NOTE  Sheryle Hail, PA-C [address] [place] [address]  DIAGNOSIS: Stage IV (T2a,N3,M1c)non-small cell lung cancer, squamous cell carcinoma presented with right upper lobe lung mass in addition to right hilar and mediastinal adenopathy as well as metastatic disease in the medial right thigh diagnosed in November 2018.  PRIOR THERAPY:  1) Palliative radiotherapy to the right lower extremity mass completed April 09, 2017. 2) Systemic chemotherapy with carboplatin for AUC of 5, paclitaxel 175 mg/M2 and Keytruda 200 mg IV every 3 weeks.  First dose March 12, 2017, status post 4 cycles with partial response.  CURRENT THERAPY: Maintenance treatment with single agent Keytruda 200 mg IV every 3 weeks.  First cycle June 11, 2017.  Status post 29 cycles.  INTERVAL HISTORY: Aaron Sellers 68 y.o. male returns to the clinic today for follow-up visit.  The patient is feeling fine today with no concerning complaints except for intermittent cough productive of greenish sputum.  He denied having any significant chest pain, shortness of breath or hemoptysis.  He denied having any fever or chills.  He has no nausea, vomiting, diarrhea or constipation.  He has no significant weight loss or night sweats.  The patient continues to tolerate his treatment with [facility] fairly well.  He had repeat CT scan of the chest, abdomen pelvis performed recently and he is here for evaluation and discussion of his scan results.  MEDICAL HISTORY: Past Medical History:  Diagnosis Date   Arthritis    Atrial flutter (HCC)    BPH (benign prostatic hypertrophy) with urinary retention    Cancer (HCC)    Chronic kidney disease    hx of kidney stones  2011   COPD (chronic obstructive pulmonary disease) (HCC)    has been smoking since he was 22 ish--   High cholesterol    Hypertension    dx around 2011     Squamous cell carcinoma of right lung ([facility])     ALLERGIES:  is allergic to ciprofloxacin; adhesive [tape]; and codeine.  MEDICATIONS:  Current Outpatient Medications  Medication Sig Dispense Refill   acetaminophen (TYLENOL) 500 MG tablet Take 1,000 mg by mouth every 6 (six) hours as needed for moderate pain.     atorvastatin (LIPITOR) 10 MG tablet Take 10 mg by mouth every evening.      CARTIA XT 180 MG 24 hr capsule Take 1 capsule by mouth once daily 90 capsule 0   Cyanocobalamin (VITAMIN B-12 PO) Take 1 tablet by mouth daily.     famotidine (PEPCID) 10 MG tablet Take 10 mg by mouth as needed for heartburn or indigestion.     furosemide (LASIX) 40 MG tablet Take 1 tablet (40 mg total) by mouth daily as needed. 90 tablet 3   gabapentin (NEURONTIN) 300 MG capsule TAKE 1 CAPSULE BY MOUTH THREE TIMES DAILY 90 capsule 0   loratadine (CLARITIN) 10 MG tablet Take 10 mg by mouth daily.     tamsulosin (FLOMAX) 0.4 MG CAPS capsule Take 0.4 mg by mouth daily.     No current facility-administered medications for this visit.    Facility-Administered Medications Ordered in Other Visits  Medication Dose Route Frequency Provider Last Rate Last Dose   heparin lock flush 100 unit/mL  500 Units Intracatheter Once PRN Curt Bears, MD       sodium chloride flush (NS) 0.9 % injection 10 mL  10 mL Intracatheter  PRN Curt Bears, MD        SURGICAL HISTORY:  Past Surgical History:  Procedure Laterality Date   HERNIA REPAIR     umbilical hernia  10/5629   INCISION AND DRAINAGE ABSCESS     "down the middle" of his buttocks  2015   TOTAL KNEE ARTHROPLASTY Left 10/29/2014   Procedure: TOTAL KNEE ARTHROPLASTY;  Surgeon: Dorna Leitz, MD;  Location: [facility];  Service: Orthopedics;  Laterality: Left;    REVIEW OF SYSTEMS:  Constitutional: negative Eyes: negative Ears, nose, mouth, throat, and face: negative Respiratory: positive for cough Cardiovascular: negative Gastrointestinal:  negative Genitourinary:negative Integument/breast: negative Hematologic/lymphatic: negative Musculoskeletal:negative Neurological: negative Behavioral/Psych: negative Endocrine: negative Allergic/Immunologic: negative   PHYSICAL EXAMINATION: General appearance: alert, cooperative and no distress Head: Normocephalic, without obvious abnormality, atraumatic Neck: no adenopathy, no JVD, supple, symmetrical, trachea midline and thyroid not enlarged, symmetric, no tenderness/mass/nodules Lymph nodes: Cervical, supraclavicular, and axillary nodes normal. Resp: clear to auscultation bilaterally Back: symmetric, no curvature. ROM normal. No CVA tenderness. Cardio: regular rate and rhythm, S1, S2 normal, no murmur, click, rub or gallop GI: soft, non-tender; bowel sounds normal; no masses,  no organomegaly Extremities: extremities normal, atraumatic, no cyanosis or edema Neurologic: Alert and oriented X 3, normal strength and tone. Normal symmetric reflexes. Normal coordination and gait  ECOG PERFORMANCE STATUS: 1 - Symptomatic but completely ambulatory  Blood pressure 133/78, pulse 66, temperature 98.9 F (37.2 C), temperature source Oral, resp. rate 18, height 6\' 8"  (2.032 m), weight 281 lb 1.6 oz (127.5 kg), SpO2 98 %.  LABORATORY DATA: Lab Results  Component Value Date   WBC 5.7 11/18/2018   HGB 14.9 11/18/2018   HCT 44.8 11/18/2018   MCV 85.7 11/18/2018   PLT 177 11/18/2018      Chemistry      Component Value Date/Time   NA 139 10/28/2018 0859   NA 137 04/03/2017 1030   K 3.9 10/28/2018 0859   K 4.1 04/03/2017 1030   CL 105 10/28/2018 0859   CO2 26 10/28/2018 0859   CO2 27 04/03/2017 1030   BUN 16 10/28/2018 0859   BUN 15.5 04/03/2017 1030   CREATININE 0.88 10/28/2018 0859   CREATININE 0.8 04/03/2017 1030      Component Value Date/Time   CALCIUM 9.7 10/28/2018 0859   CALCIUM 9.4 04/03/2017 1030   ALKPHOS 91 10/28/2018 0859   ALKPHOS 97 04/03/2017 1030   AST 18  10/28/2018 0859   AST 14 04/03/2017 1030   ALT 17 10/28/2018 0859   ALT 21 04/03/2017 1030   BILITOT 0.9 10/28/2018 0859   BILITOT 0.67 04/03/2017 1030       RADIOGRAPHIC STUDIES: Ct Chest W Contrast  Result Date: 11/17/2018 CLINICAL DATA:  Lung cancer. Ongoing chemotherapy and immunotherapy. Radiation therapy complete. Cough for 2-3 weeks. EXAM: CT CHEST, ABDOMEN, AND PELVIS WITH CONTRAST TECHNIQUE: Multidetector CT imaging of the chest, abdomen and pelvis was performed following the standard protocol during bolus administration of intravenous contrast. CONTRAST:  132mL OMNIPAQUE IOHEXOL 300 MG/ML  SOLN COMPARISON:  08/22/2018. FINDINGS: CT CHEST FINDINGS Cardiovascular: Atherosclerotic calcification of the aorta and coronary arteries. Heart is at the upper limits of normal in size to mildly enlarged. No pericardial effusion. Mediastinum/Nodes: Mediastinal lymph nodes are not enlarged by CT size criteria. No hilar adenopathy. Left axillary lymph node measures 1.3 cm, decreased from 1.6 cm. Lungs/Pleura: Spiculated apical segment right upper lobe nodule measures 0.9 x 1.5 cm (7/43), stable. There are additional areas of more organized  appearing ground-glass bilaterally, when compared with 08/22/2018. A new, somewhat ill-defined cavitary nodule in the lingula measures 1.4 x 1.6 cm (7/74). Additional areas of new nodularity, consolidation and ground-glass are seen bilaterally. No pleural fluid. Airway is unremarkable. Musculoskeletal: Degenerative changes in the spine. No worrisome lytic or sclerotic lesions. Probable bone island in the posterior elements of C2. CT ABDOMEN PELVIS FINDINGS Hepatobiliary: Liver margin is slightly irregular. Liver and gallbladder are otherwise unremarkable. No biliary ductal dilatation. Pancreas: Negative. Spleen: Negative. Adrenals/Urinary Tract: Right adrenal nodules measure 3.2 cm/40 Hounsfield units and 1.9 cm/19 Hounsfield units, respectively, stable from 08/22/2018.  Left adrenal gland is unremarkable. Tiny stone in the right kidney. 2.2 cm fluid density lesion in the right kidney is likely a cyst. Ureters are decompressed. Bladder is low in volume. Stomach/Bowel: Stomach, small bowel, appendix and colon are unremarkable. Vascular/Lymphatic: Atherosclerotic calcification of the aorta. Infrarenal aorta measures up to 2.7 cm. Splenorenal shunt is again noted. 12 mm periportal lymph node, periportal lymph nodes measure up to 1.5 cm, unchanged. Retroperitoneal lymph nodes are not enlarged by CT size criteria. Reproductive: Prostate is visualized. Other: No free fluid. Left hemidiaphragm is slightly elevated. Mesenteries and peritoneum are unremarkable. Musculoskeletal: Degenerative changes in the spine. No worrisome lytic or sclerotic lesions. IMPRESSION: 1. New areas of peribronchovascular ground-glass, nodularity and consolidation bilaterally, with an increasingly organized appearance of previously seen areas of ground-glass. Findings may be due to an atypical or fungal pneumonia. Metastatic disease cannot be excluded, nor can immunotherapy related pneumonitis. 2. Spiculated right upper lobe nodule is stable. 3. Cirrhosis with a splenorenal shunt. 4. Right adrenal nodules have been previously characterized as adenomas. 5. Tiny right renal stone. 6. Ectatic abdominal aorta at risk for aneurysm development. Recommend followup by ultrasound in 5 years. This recommendation follows ACR consensus guidelines: White Paper of the ACR Incidental Findings Committee II on Vascular Findings. J Am Coll Radiol 2013; 10:789-794. 7. Aortic atherosclerosis (ICD10-170.0). Coronary artery calcification. Electronically Signed   By: Lorin Picket M.D.   On: 11/17/2018 14:55   Ct Abdomen Pelvis W Contrast  Result Date: 11/17/2018 CLINICAL DATA:  Lung cancer. Ongoing chemotherapy and immunotherapy. Radiation therapy complete. Cough for 2-3 weeks. EXAM: CT CHEST, ABDOMEN, AND PELVIS WITH CONTRAST  TECHNIQUE: Multidetector CT imaging of the chest, abdomen and pelvis was performed following the standard protocol during bolus administration of intravenous contrast. CONTRAST:  168mL OMNIPAQUE IOHEXOL 300 MG/ML  SOLN COMPARISON:  08/22/2018. FINDINGS: CT CHEST FINDINGS Cardiovascular: Atherosclerotic calcification of the aorta and coronary arteries. Heart is at the upper limits of normal in size to mildly enlarged. No pericardial effusion. Mediastinum/Nodes: Mediastinal lymph nodes are not enlarged by CT size criteria. No hilar adenopathy. Left axillary lymph node measures 1.3 cm, decreased from 1.6 cm. Lungs/Pleura: Spiculated apical segment right upper lobe nodule measures 0.9 x 1.5 cm (7/43), stable. There are additional areas of more organized appearing ground-glass bilaterally, when compared with 08/22/2018. A new, somewhat ill-defined cavitary nodule in the lingula measures 1.4 x 1.6 cm (7/74). Additional areas of new nodularity, consolidation and ground-glass are seen bilaterally. No pleural fluid. Airway is unremarkable. Musculoskeletal: Degenerative changes in the spine. No worrisome lytic or sclerotic lesions. Probable bone island in the posterior elements of C2. CT ABDOMEN PELVIS FINDINGS Hepatobiliary: Liver margin is slightly irregular. Liver and gallbladder are otherwise unremarkable. No biliary ductal dilatation. Pancreas: Negative. Spleen: Negative. Adrenals/Urinary Tract: Right adrenal nodules measure 3.2 cm/40 Hounsfield units and 1.9 cm/19 Hounsfield units, respectively, stable from 08/22/2018. Left adrenal  gland is unremarkable. Tiny stone in the right kidney. 2.2 cm fluid density lesion in the right kidney is likely a cyst. Ureters are decompressed. Bladder is low in volume. Stomach/Bowel: Stomach, small bowel, appendix and colon are unremarkable. Vascular/Lymphatic: Atherosclerotic calcification of the aorta. Infrarenal aorta measures up to 2.7 cm. Splenorenal shunt is again noted. 12 mm  periportal lymph node, periportal lymph nodes measure up to 1.5 cm, unchanged. Retroperitoneal lymph nodes are not enlarged by CT size criteria. Reproductive: Prostate is visualized. Other: No free fluid. Left hemidiaphragm is slightly elevated. Mesenteries and peritoneum are unremarkable. Musculoskeletal: Degenerative changes in the spine. No worrisome lytic or sclerotic lesions. IMPRESSION: 1. New areas of peribronchovascular ground-glass, nodularity and consolidation bilaterally, with an increasingly organized appearance of previously seen areas of ground-glass. Findings may be due to an atypical or fungal pneumonia. Metastatic disease cannot be excluded, nor can immunotherapy related pneumonitis. 2. Spiculated right upper lobe nodule is stable. 3. Cirrhosis with a splenorenal shunt. 4. Right adrenal nodules have been previously characterized as adenomas. 5. Tiny right renal stone. 6. Ectatic abdominal aorta at risk for aneurysm development. Recommend followup by ultrasound in 5 years. This recommendation follows ACR consensus guidelines: White Paper of the ACR Incidental Findings Committee II on Vascular Findings. J Am Coll Radiol 2013; 10:789-794. 7. Aortic atherosclerosis (ICD10-170.0). Coronary artery calcification. Electronically Signed   By: Lorin Picket M.D.   On: 11/17/2018 14:55    ASSESSMENT AND PLAN: This is a very pleasant 68 years old white male with a stage IV non-small cell lung cancer, squamous cell carcinoma.  The patient is currently undergoing systemic chemotherapy with carboplatin, paclitaxel and Keytruda status post 4 cycles with partial response. He is currently undergoing maintenance treatment with single agent Keytruda status post 29 cycles. The patient continues to tolerate his treatment well with no concerning adverse effects. He had repeat CT scan of the chest, abdomen pelvis performed recently.  I personally and independently reviewed the scan images and discussed the results  with the patient today. His a scan showed no concerning findings for disease progression but there was new areas of peri-bronchovascular groundglass nodularity and consolidation bilaterally that need close observation.  This could be an atypical or fungal pneumonia but also immune mediated pneumonitis could not be excluded.  He is currently asymptomatic. Recommended for the patient to continue his current treatment with [facility] and he will proceed with cycle #30 today. I will see him back for follow-up visit in 3 weeks for evaluation before starting cycle #31. The patient was advised to call immediately if he has any concerning symptoms in the interval. The patient voices understanding of current disease status and treatment options and is in agreement with the current care plan. All questions were answered. The patient knows to call the clinic with any problems, questions or concerns. We can certainly see the patient much sooner if necessary.  Disclaimer: This note was dictated with voice recognition software. Similar sounding words can inadvertently be transcribed and may not be corrected upon review.

## 2018-11-18 NOTE — Patient Instructions (Signed)
[facility] Discharge Instructions for Patients Receiving Chemotherapy  Today you received the following chemotherapy agents:  Keytruda.  To help prevent nausea and vomiting after your treatment, we encourage you to take your nausea medication as directed.   If you develop nausea and vomiting that is not controlled by your nausea medication, call the clinic.   BELOW ARE SYMPTOMS THAT SHOULD BE REPORTED IMMEDIATELY:  *FEVER GREATER THAN 100.5 F  *CHILLS WITH OR WITHOUT FEVER  NAUSEA AND VOMITING THAT IS NOT CONTROLLED WITH YOUR NAUSEA MEDICATION  *UNUSUAL SHORTNESS OF BREATH  *UNUSUAL BRUISING OR BLEEDING  TENDERNESS IN MOUTH AND THROAT WITH OR WITHOUT PRESENCE OF ULCERS  *URINARY PROBLEMS  *BOWEL PROBLEMS  UNUSUAL RASH Items with * indicate a potential emergency and should be followed up as soon as possible.  Feel free to call the clinic should you have any questions or concerns. The clinic phone number is [phone].  Please show the CHEMO ALERT CARD at check-in to the Emergency Department and triage nurse.    

## 2018-11-27 ENCOUNTER — Other Ambulatory Visit: Payer: Self-pay | Admitting: Cardiology

## 2018-11-27 ENCOUNTER — Other Ambulatory Visit: Payer: Self-pay | Admitting: Internal Medicine

## 2018-11-27 DIAGNOSIS — G6289 Other specified polyneuropathies: Secondary | ICD-10-CM

## 2018-11-27 NOTE — Telephone Encounter (Signed)
Note refill request.

## 2018-12-09 ENCOUNTER — Inpatient Hospital Stay (HOSPITAL_BASED_OUTPATIENT_CLINIC_OR_DEPARTMENT_OTHER): Payer: Medicare Other | Admitting: Internal Medicine

## 2018-12-09 ENCOUNTER — Telehealth: Payer: Self-pay | Admitting: Internal Medicine

## 2018-12-09 ENCOUNTER — Encounter: Payer: Self-pay | Admitting: Internal Medicine

## 2018-12-09 ENCOUNTER — Inpatient Hospital Stay: Payer: Medicare Other | Attending: Oncology

## 2018-12-09 ENCOUNTER — Inpatient Hospital Stay: Payer: Medicare Other

## 2018-12-09 ENCOUNTER — Other Ambulatory Visit: Payer: Self-pay

## 2018-12-09 VITALS — BP 132/81 | HR 62 | Temp 98.7°F | Resp 18 | Ht >= 80 in | Wt 278.2 lb

## 2018-12-09 DIAGNOSIS — Z923 Personal history of irradiation: Secondary | ICD-10-CM | POA: Insufficient documentation

## 2018-12-09 DIAGNOSIS — C3491 Malignant neoplasm of unspecified part of right bronchus or lung: Secondary | ICD-10-CM

## 2018-12-09 DIAGNOSIS — Z79899 Other long term (current) drug therapy: Secondary | ICD-10-CM | POA: Insufficient documentation

## 2018-12-09 DIAGNOSIS — I129 Hypertensive chronic kidney disease with stage 1 through stage 4 chronic kidney disease, or unspecified chronic kidney disease: Secondary | ICD-10-CM | POA: Diagnosis not present

## 2018-12-09 DIAGNOSIS — M1990 Unspecified osteoarthritis, unspecified site: Secondary | ICD-10-CM | POA: Diagnosis not present

## 2018-12-09 DIAGNOSIS — Z9221 Personal history of antineoplastic chemotherapy: Secondary | ICD-10-CM | POA: Diagnosis not present

## 2018-12-09 DIAGNOSIS — Z23 Encounter for immunization: Secondary | ICD-10-CM | POA: Diagnosis not present

## 2018-12-09 DIAGNOSIS — Z5112 Encounter for antineoplastic immunotherapy: Secondary | ICD-10-CM

## 2018-12-09 DIAGNOSIS — C7951 Secondary malignant neoplasm of bone: Secondary | ICD-10-CM | POA: Diagnosis not present

## 2018-12-09 DIAGNOSIS — M542 Cervicalgia: Secondary | ICD-10-CM | POA: Diagnosis not present

## 2018-12-09 DIAGNOSIS — M791 Myalgia, unspecified site: Secondary | ICD-10-CM | POA: Diagnosis not present

## 2018-12-09 DIAGNOSIS — E7800 Pure hypercholesterolemia, unspecified: Secondary | ICD-10-CM | POA: Insufficient documentation

## 2018-12-09 DIAGNOSIS — C3411 Malignant neoplasm of upper lobe, right bronchus or lung: Secondary | ICD-10-CM | POA: Diagnosis not present

## 2018-12-09 DIAGNOSIS — C781 Secondary malignant neoplasm of mediastinum: Secondary | ICD-10-CM | POA: Insufficient documentation

## 2018-12-09 DIAGNOSIS — N189 Chronic kidney disease, unspecified: Secondary | ICD-10-CM | POA: Insufficient documentation

## 2018-12-09 DIAGNOSIS — F17210 Nicotine dependence, cigarettes, uncomplicated: Secondary | ICD-10-CM | POA: Diagnosis not present

## 2018-12-09 DIAGNOSIS — R5382 Chronic fatigue, unspecified: Secondary | ICD-10-CM

## 2018-12-09 DIAGNOSIS — J449 Chronic obstructive pulmonary disease, unspecified: Secondary | ICD-10-CM | POA: Diagnosis not present

## 2018-12-09 LAB — CMP (CANCER CENTER ONLY)
ALT: 16 U/L (ref 0–44)
AST: 18 U/L (ref 15–41)
Albumin: 4.2 g/dL (ref 3.5–5.0)
Alkaline Phosphatase: 85 U/L (ref 38–126)
Anion gap: 9 (ref 5–15)
BUN: 14 mg/dL (ref 8–23)
CO2: 26 mmol/L (ref 22–32)
Calcium: 9.3 mg/dL (ref 8.9–10.3)
Chloride: 104 mmol/L (ref 98–111)
Creatinine: 0.93 mg/dL (ref 0.61–1.24)
GFR, Est AFR Am: >60 mL/min (ref >60)
GFR, Estimated: >60 mL/min (ref >60)
Glucose, Bld: 105 mg/dL — ABNORMAL HIGH (ref 70–99)
Potassium: 3.8 mmol/L (ref 3.5–5.1)
Sodium: 139 mmol/L (ref 135–145)
Total Bilirubin: 1.1 mg/dL (ref 0.3–1.2)
Total Protein: 7.2 g/dL (ref 6.5–8.1)

## 2018-12-09 LAB — CBC WITH DIFFERENTIAL (CANCER CENTER ONLY)
Abs Immature Granulocytes: 0.01 10*3/uL (ref 0.00–0.07)
Basophils Absolute: 0.1 10*3/uL (ref 0.0–0.1)
Basophils Relative: 1 %
Eosinophils Absolute: 0.2 10*3/uL (ref 0.0–0.5)
Eosinophils Relative: 3 %
HCT: 45.9 % (ref 39.0–52.0)
Hemoglobin: 15.4 g/dL (ref 13.0–17.0)
Immature Granulocytes: 0 %
Lymphocytes Relative: 32 %
Lymphs Abs: 2.1 10*3/uL (ref 0.7–4.0)
MCH: 28.2 pg (ref 26.0–34.0)
MCHC: 33.6 g/dL (ref 30.0–36.0)
MCV: 84.1 fL (ref 80.0–100.0)
Monocytes Absolute: 0.6 10*3/uL (ref 0.1–1.0)
Monocytes Relative: 9 %
Neutro Abs: 3.8 10*3/uL (ref 1.7–7.7)
Neutrophils Relative %: 55 %
Platelet Count: 166 10*3/uL (ref 150–400)
RBC: 5.46 MIL/uL (ref 4.22–5.81)
RDW: 13.4 % (ref 11.5–15.5)
WBC Count: 6.8 10*3/uL (ref 4.0–10.5)
nRBC: 0 % (ref 0.0–0.2)

## 2018-12-09 LAB — TSH: TSH: 2.744 u[IU]/mL (ref 0.320–4.118)

## 2018-12-09 MED ORDER — INFLUENZA VAC SPLIT QUAD 0.5 ML IM SUSY
0.5000 mL | PREFILLED_SYRINGE | Freq: Once | INTRAMUSCULAR | Status: AC
  Administered 2018-12-09: 0.5 mL via INTRAMUSCULAR
  Filled 2018-12-09: qty 0.5

## 2018-12-09 MED ORDER — SODIUM CHLORIDE 0.9 % IV SOLN
200.0000 mg | Freq: Once | INTRAVENOUS | Status: AC
  Administered 2018-12-09: 200 mg via INTRAVENOUS
  Filled 2018-12-09: qty 4

## 2018-12-09 MED ORDER — SODIUM CHLORIDE 0.9 % IV SOLN
Freq: Once | INTRAVENOUS | Status: AC
  Administered 2018-12-09: 10:00:00 via INTRAVENOUS
  Filled 2018-12-09: qty 250

## 2018-12-09 NOTE — Patient Instructions (Signed)
[facility] Discharge Instructions for Patients Receiving Chemotherapy  Today you received the following chemotherapy agents: Keytruda  To help prevent nausea and vomiting after your treatment, we encourage you to take your nausea medication as directed.   If you develop nausea and vomiting that is not controlled by your nausea medication, call the clinic.   BELOW ARE SYMPTOMS THAT SHOULD BE REPORTED IMMEDIATELY:  *FEVER GREATER THAN 100.5 F  *CHILLS WITH OR WITHOUT FEVER  NAUSEA AND VOMITING THAT IS NOT CONTROLLED WITH YOUR NAUSEA MEDICATION  *UNUSUAL SHORTNESS OF BREATH  *UNUSUAL BRUISING OR BLEEDING  TENDERNESS IN MOUTH AND THROAT WITH OR WITHOUT PRESENCE OF ULCERS  *URINARY PROBLEMS  *BOWEL PROBLEMS  UNUSUAL RASH Items with * indicate a potential emergency and should be followed up as soon as possible.  Feel free to call the clinic should you have any questions or concerns. The clinic phone number is [phone].  Please show the [place] at check-in to the Emergency Department and triage nurse.  Influenza (Flu) Vaccine (Inactivated or Recombinant): What You Need to Know 1. Why get vaccinated? Influenza vaccine can prevent influenza (flu). Flu is a contagious disease that spreads around the [place] every year, usually between October and May. Anyone can get the flu, but it is more dangerous for some people. Infants and young children, people 3 years of age and older, pregnant women, and people with certain health conditions or a weakened immune system are at greatest risk of flu complications. Pneumonia, bronchitis, sinus infections and ear infections are examples of flu-related complications. If you have a medical condition, such as heart disease, cancer or diabetes, flu can make it worse. Flu can cause fever and chills, sore throat, muscle aches, fatigue, cough, headache, and runny or stuffy nose. Some people may have vomiting and diarrhea,  though this is more common in children than adults. Each year thousands of people in the [place] die from flu, and many more are hospitalized. Flu vaccine prevents millions of illnesses and flu-related visits to the doctor each year. 2. Influenza vaccine CDC recommends everyone 40 months of age and older get vaccinated every flu season. Children 6 months through 94 years of age may need 2 doses during a single flu season. Everyone else needs only 1 dose each flu season. It takes about 2 weeks for protection to develop after vaccination. There are many flu viruses, and they are always changing. Each year a new flu vaccine is made to protect against three or four viruses that are likely to cause disease in the upcoming flu season. Even when the vaccine doesn't exactly match these viruses, it may still provide some protection. Influenza vaccine does not cause flu. Influenza vaccine may be given at the same time as other vaccines. 3. Talk with your health care provider Tell your vaccine provider if the person getting the vaccine:  Has had an allergic reaction after a previous dose of influenza vaccine, or has any severe, life-threatening allergies.  Has ever had Guillain-Barr Syndrome (also called GBS). In some cases, your health care provider may decide to postpone influenza vaccination to a future visit. People with minor illnesses, such as a cold, may be vaccinated. People who are moderately or severely ill should usually wait until they recover before getting influenza vaccine. Your health care provider can give you more information. 4. Risks of a vaccine reaction  Soreness, redness, and swelling where shot is given, fever, muscle aches, and headache can happen  after influenza vaccine.  There may be a very small increased risk of Guillain-Barr Syndrome (GBS) after inactivated influenza vaccine (the flu shot). Young children who get the flu shot along with pneumococcal vaccine (PCV13),  and/or DTaP vaccine at the same time might be slightly more likely to have a seizure caused by fever. Tell your health care provider if a child who is getting flu vaccine has ever had a seizure. People sometimes faint after medical procedures, including vaccination. Tell your provider if you feel dizzy or have vision changes or ringing in the ears. As with any medicine, there is a very remote chance of a vaccine causing a severe allergic reaction, other serious injury, or death. 5. What if there is a serious problem? An allergic reaction could occur after the vaccinated person leaves the clinic. If you see signs of a severe allergic reaction (hives, swelling of the face and throat, difficulty breathing, a fast heartbeat, dizziness, or weakness), call 9-1-1 and get the person to the nearest hospital. For other signs that concern you, call your health care provider. Adverse reactions should be reported to the Vaccine Adverse Event Reporting System (VAERS). Your health care provider will usually file this report, or you can do it yourself. Visit the VAERS website at www.vaers.SamedayNews.es or call (667) 768-6632.VAERS is only for reporting reactions, and VAERS staff do not give medical advice. 6. The National Vaccine Injury Compensation Program The [employer] Vaccine Injury Compensation Program (VICP) is a federal program that was created to compensate people who may have been injured by certain vaccines. Visit the VICP website at GoldCloset.com.ee or call 669-186-2674 to learn about the program and about filing a claim. There is a time limit to file a claim for compensation. 7. How can I learn more?  Ask your healthcare provider.  Call your local or state health department.  Contact the Centers for Disease Control and Prevention (CDC): ? Call (912)039-3440 (1-800-CDC-INFO) or ? Visit CDC's https://gibson.com/ Vaccine Information Statement (Interim) Inactivated Influenza Vaccine  (11/14/2017) This information is not intended to replace advice given to you by your health care provider. Make sure you discuss any questions you have with your health care provider. Document Released: 01/11/2006 Document Revised: 07/08/2018 Document Reviewed: 11/18/2017 Elsevier Patient Education  2020 [employer].

## 2018-12-09 NOTE — Progress Notes (Signed)
[facility] [phone]   Fax:([phone]  OFFICE PROGRESS NOTE  Sheryle Hail, PA-C [address] [place] [address]  DIAGNOSIS: Stage IV (T2a,N3,M1c)non-small cell lung cancer, squamous cell carcinoma presented with right upper lobe lung mass in addition to right hilar and mediastinal adenopathy as well as metastatic disease in the medial right thigh diagnosed in November 2018.  PRIOR THERAPY:  1) Palliative radiotherapy to the right lower extremity mass completed April 09, 2017. 2) Systemic chemotherapy with carboplatin for AUC of 5, paclitaxel 175 mg/M2 and Keytruda 200 mg IV every 3 weeks.  First dose March 12, 2017, status post 4 cycles with partial response.  CURRENT THERAPY: Maintenance treatment with single agent Keytruda 200 mg IV every 3 weeks.  First cycle June 11, 2017.  Status post 30 cycles.  INTERVAL HISTORY: Aaron Sellers 68 y.o. male returns to the clinic today for follow-up visit.  The patient is feeling fine today with no concerning complaints except for occasional muscle pain on the right side.  He denied having any chest pain, shortness of breath, cough or hemoptysis.  He denied having any fever or chills.  He has no nausea, vomiting, diarrhea or constipation.  He has no headache or visual changes.  He is here today for evaluation before starting cycle #31.  MEDICAL HISTORY: Past Medical History:  Diagnosis Date   Arthritis    Atrial flutter (HCC)    BPH (benign prostatic hypertrophy) with urinary retention    Cancer (HCC)    Chronic kidney disease    hx of kidney stones  2011   COPD (chronic obstructive pulmonary disease) (HCC)    has been smoking since he was 22 ish--   High cholesterol    Hypertension    dx around 2011   Squamous cell carcinoma of right lung ([facility])     ALLERGIES:  is allergic to ciprofloxacin; adhesive [tape]; and codeine.  MEDICATIONS:  Current Outpatient Medications    Medication Sig Dispense Refill   acetaminophen (TYLENOL) 500 MG tablet Take 1,000 mg by mouth every 6 (six) hours as needed for moderate pain.     atorvastatin (LIPITOR) 10 MG tablet Take 10 mg by mouth every evening.      CARTIA XT 180 MG 24 hr capsule Take 1 capsule by mouth once daily 90 capsule 0   Cyanocobalamin (VITAMIN B-12 PO) Take 1 tablet by mouth daily.     famotidine (PEPCID) 10 MG tablet Take 10 mg by mouth as needed for heartburn or indigestion.     furosemide (LASIX) 40 MG tablet Take 1 tablet (40 mg total) by mouth daily as needed. 90 tablet 3   gabapentin (NEURONTIN) 300 MG capsule TAKE 1 CAPSULE BY MOUTH THREE TIMES DAILY 90 capsule 0   loratadine (CLARITIN) 10 MG tablet Take 10 mg by mouth daily.     tamsulosin (FLOMAX) 0.4 MG CAPS capsule Take 0.4 mg by mouth daily.     No current facility-administered medications for this visit.    Facility-Administered Medications Ordered in Other Visits  Medication Dose Route Frequency Provider Last Rate Last Dose   heparin lock flush 100 unit/mL  500 Units Intracatheter Once PRN Curt Bears, MD       sodium chloride flush (NS) 0.9 % injection 10 mL  10 mL Intracatheter PRN Curt Bears, MD        SURGICAL HISTORY:  Past Surgical History:  Procedure Laterality Date   HERNIA REPAIR  umbilical hernia  0/6301   INCISION AND DRAINAGE ABSCESS     "down the middle" of his buttocks  2015   TOTAL KNEE ARTHROPLASTY Left 10/29/2014   Procedure: TOTAL KNEE ARTHROPLASTY;  Surgeon: Dorna Leitz, MD;  Location: [facility];  Service: Orthopedics;  Laterality: Left;    REVIEW OF SYSTEMS:  A comprehensive review of systems was negative.   PHYSICAL EXAMINATION: General appearance: alert, cooperative and no distress Head: Normocephalic, without obvious abnormality, atraumatic Neck: no adenopathy, no JVD, supple, symmetrical, trachea midline and thyroid not enlarged, symmetric, no tenderness/mass/nodules Lymph nodes:  Cervical, supraclavicular, and axillary nodes normal. Resp: clear to auscultation bilaterally Back: symmetric, no curvature. ROM normal. No CVA tenderness. Cardio: regular rate and rhythm, S1, S2 normal, no murmur, click, rub or gallop GI: soft, non-tender; bowel sounds normal; no masses,  no organomegaly Extremities: extremities normal, atraumatic, no cyanosis or edema  ECOG PERFORMANCE STATUS: 1 - Symptomatic but completely ambulatory  Blood pressure 132/81, pulse 62, temperature 98.7 F (37.1 C), temperature source Oral, resp. rate 18, height 6\' 8"  (2.032 m), weight 278 lb 3.2 oz (126.2 kg), SpO2 100 %.  LABORATORY DATA: Lab Results  Component Value Date   WBC 6.8 12/09/2018   HGB 15.4 12/09/2018   HCT 45.9 12/09/2018   MCV 84.1 12/09/2018   PLT 166 12/09/2018      Chemistry      Component Value Date/Time   NA 140 11/18/2018 0929   NA 137 04/03/2017 1030   K 3.8 11/18/2018 0929   K 4.1 04/03/2017 1030   CL 106 11/18/2018 0929   CO2 24 11/18/2018 0929   CO2 27 04/03/2017 1030   BUN 11 11/18/2018 0929   BUN 15.5 04/03/2017 1030   CREATININE 0.84 11/18/2018 0929   CREATININE 0.8 04/03/2017 1030      Component Value Date/Time   CALCIUM 9.0 11/18/2018 0929   CALCIUM 9.4 04/03/2017 1030   ALKPHOS 88 11/18/2018 0929   ALKPHOS 97 04/03/2017 1030   AST 15 11/18/2018 0929   AST 14 04/03/2017 1030   ALT 16 11/18/2018 0929   ALT 21 04/03/2017 1030   BILITOT 0.9 11/18/2018 0929   BILITOT 0.67 04/03/2017 1030       RADIOGRAPHIC STUDIES: Ct Chest W Contrast  Result Date: 11/17/2018 CLINICAL DATA:  Lung cancer. Ongoing chemotherapy and immunotherapy. Radiation therapy complete. Cough for 2-3 weeks. EXAM: CT CHEST, ABDOMEN, AND PELVIS WITH CONTRAST TECHNIQUE: Multidetector CT imaging of the chest, abdomen and pelvis was performed following the standard protocol during bolus administration of intravenous contrast. CONTRAST:  162mL OMNIPAQUE IOHEXOL 300 MG/ML  SOLN COMPARISON:   08/22/2018. FINDINGS: CT CHEST FINDINGS Cardiovascular: Atherosclerotic calcification of the aorta and coronary arteries. Heart is at the upper limits of normal in size to mildly enlarged. No pericardial effusion. Mediastinum/Nodes: Mediastinal lymph nodes are not enlarged by CT size criteria. No hilar adenopathy. Left axillary lymph node measures 1.3 cm, decreased from 1.6 cm. Lungs/Pleura: Spiculated apical segment right upper lobe nodule measures 0.9 x 1.5 cm (7/43), stable. There are additional areas of more organized appearing ground-glass bilaterally, when compared with 08/22/2018. A new, somewhat ill-defined cavitary nodule in the lingula measures 1.4 x 1.6 cm (7/74). Additional areas of new nodularity, consolidation and ground-glass are seen bilaterally. No pleural fluid. Airway is unremarkable. Musculoskeletal: Degenerative changes in the spine. No worrisome lytic or sclerotic lesions. Probable bone island in the posterior elements of C2. CT ABDOMEN PELVIS FINDINGS Hepatobiliary: Liver margin is slightly irregular. Liver and  gallbladder are otherwise unremarkable. No biliary ductal dilatation. Pancreas: Negative. Spleen: Negative. Adrenals/Urinary Tract: Right adrenal nodules measure 3.2 cm/40 Hounsfield units and 1.9 cm/19 Hounsfield units, respectively, stable from 08/22/2018. Left adrenal gland is unremarkable. Tiny stone in the right kidney. 2.2 cm fluid density lesion in the right kidney is likely a cyst. Ureters are decompressed. Bladder is low in volume. Stomach/Bowel: Stomach, small bowel, appendix and colon are unremarkable. Vascular/Lymphatic: Atherosclerotic calcification of the aorta. Infrarenal aorta measures up to 2.7 cm. Splenorenal shunt is again noted. 12 mm periportal lymph node, periportal lymph nodes measure up to 1.5 cm, unchanged. Retroperitoneal lymph nodes are not enlarged by CT size criteria. Reproductive: Prostate is visualized. Other: No free fluid. Left hemidiaphragm is slightly  elevated. Mesenteries and peritoneum are unremarkable. Musculoskeletal: Degenerative changes in the spine. No worrisome lytic or sclerotic lesions. IMPRESSION: 1. New areas of peribronchovascular ground-glass, nodularity and consolidation bilaterally, with an increasingly organized appearance of previously seen areas of ground-glass. Findings may be due to an atypical or fungal pneumonia. Metastatic disease cannot be excluded, nor can immunotherapy related pneumonitis. 2. Spiculated right upper lobe nodule is stable. 3. Cirrhosis with a splenorenal shunt. 4. Right adrenal nodules have been previously characterized as adenomas. 5. Tiny right renal stone. 6. Ectatic abdominal aorta at risk for aneurysm development. Recommend followup by ultrasound in 5 years. This recommendation follows ACR consensus guidelines: White Paper of the ACR Incidental Findings Committee II on Vascular Findings. J Am Coll Radiol 2013; 10:789-794. 7. Aortic atherosclerosis (ICD10-170.0). Coronary artery calcification. Electronically Signed   By: Lorin Picket M.D.   On: 11/17/2018 14:55   Ct Abdomen Pelvis W Contrast  Result Date: 11/17/2018 CLINICAL DATA:  Lung cancer. Ongoing chemotherapy and immunotherapy. Radiation therapy complete. Cough for 2-3 weeks. EXAM: CT CHEST, ABDOMEN, AND PELVIS WITH CONTRAST TECHNIQUE: Multidetector CT imaging of the chest, abdomen and pelvis was performed following the standard protocol during bolus administration of intravenous contrast. CONTRAST:  124mL OMNIPAQUE IOHEXOL 300 MG/ML  SOLN COMPARISON:  08/22/2018. FINDINGS: CT CHEST FINDINGS Cardiovascular: Atherosclerotic calcification of the aorta and coronary arteries. Heart is at the upper limits of normal in size to mildly enlarged. No pericardial effusion. Mediastinum/Nodes: Mediastinal lymph nodes are not enlarged by CT size criteria. No hilar adenopathy. Left axillary lymph node measures 1.3 cm, decreased from 1.6 cm. Lungs/Pleura: Spiculated  apical segment right upper lobe nodule measures 0.9 x 1.5 cm (7/43), stable. There are additional areas of more organized appearing ground-glass bilaterally, when compared with 08/22/2018. A new, somewhat ill-defined cavitary nodule in the lingula measures 1.4 x 1.6 cm (7/74). Additional areas of new nodularity, consolidation and ground-glass are seen bilaterally. No pleural fluid. Airway is unremarkable. Musculoskeletal: Degenerative changes in the spine. No worrisome lytic or sclerotic lesions. Probable bone island in the posterior elements of C2. CT ABDOMEN PELVIS FINDINGS Hepatobiliary: Liver margin is slightly irregular. Liver and gallbladder are otherwise unremarkable. No biliary ductal dilatation. Pancreas: Negative. Spleen: Negative. Adrenals/Urinary Tract: Right adrenal nodules measure 3.2 cm/40 Hounsfield units and 1.9 cm/19 Hounsfield units, respectively, stable from 08/22/2018. Left adrenal gland is unremarkable. Tiny stone in the right kidney. 2.2 cm fluid density lesion in the right kidney is likely a cyst. Ureters are decompressed. Bladder is low in volume. Stomach/Bowel: Stomach, small bowel, appendix and colon are unremarkable. Vascular/Lymphatic: Atherosclerotic calcification of the aorta. Infrarenal aorta measures up to 2.7 cm. Splenorenal shunt is again noted. 12 mm periportal lymph node, periportal lymph nodes measure up to 1.5 cm, unchanged. Retroperitoneal  lymph nodes are not enlarged by CT size criteria. Reproductive: Prostate is visualized. Other: No free fluid. Left hemidiaphragm is slightly elevated. Mesenteries and peritoneum are unremarkable. Musculoskeletal: Degenerative changes in the spine. No worrisome lytic or sclerotic lesions. IMPRESSION: 1. New areas of peribronchovascular ground-glass, nodularity and consolidation bilaterally, with an increasingly organized appearance of previously seen areas of ground-glass. Findings may be due to an atypical or fungal pneumonia. Metastatic  disease cannot be excluded, nor can immunotherapy related pneumonitis. 2. Spiculated right upper lobe nodule is stable. 3. Cirrhosis with a splenorenal shunt. 4. Right adrenal nodules have been previously characterized as adenomas. 5. Tiny right renal stone. 6. Ectatic abdominal aorta at risk for aneurysm development. Recommend followup by ultrasound in 5 years. This recommendation follows ACR consensus guidelines: White Paper of the ACR Incidental Findings Committee II on Vascular Findings. J Am Coll Radiol 2013; 10:789-794. 7. Aortic atherosclerosis (ICD10-170.0). Coronary artery calcification. Electronically Signed   By: Lorin Picket M.D.   On: 11/17/2018 14:55    ASSESSMENT AND PLAN: This is a very pleasant 68 years old white male with a stage IV non-small cell lung cancer, squamous cell carcinoma.  The patient is currently undergoing systemic chemotherapy with carboplatin, paclitaxel and Keytruda status post 4 cycles with partial response. He is currently undergoing maintenance treatment with single agent Keytruda status post 30 cycles. The patient has been tolerating the treatment well with no concerning adverse effects. I recommended for him to proceed with cycle #31 today as planned. I will see him back for follow-up visit in 3 weeks for evaluation before the next cycle of his treatment. The patient was advised to call immediately if he has any concerning symptoms in the interval. The patient voices understanding of current disease status and treatment options and is in agreement with the current care plan. All questions were answered. The patient knows to call the clinic with any problems, questions or concerns. We can certainly see the patient much sooner if necessary.  Disclaimer: This note was dictated with voice recognition software. Similar sounding words can inadvertently be transcribed and may not be corrected upon review.

## 2018-12-09 NOTE — Telephone Encounter (Signed)
Scheduled appt per 9/8 los - pt to get an updated schedule next visit. - added additional cycles to apts already on schedule.

## 2018-12-12 ENCOUNTER — Telehealth: Payer: Self-pay | Admitting: Internal Medicine

## 2018-12-12 NOTE — Telephone Encounter (Signed)
Returned patient's phone call regarding rescheduling 09/29 and 10/20 appointment time, per patient's request appointment times have been rescheduled on both dates due to transportation issues.

## 2018-12-19 ENCOUNTER — Telehealth: Payer: Self-pay | Admitting: *Deleted

## 2018-12-19 NOTE — Telephone Encounter (Signed)
Received call from pt's caregiver, [name]. She states patient stepped on some broken glass and had some pieces lodge in his foot. She is asking to see when his last tetanus shot was. Per his chart it was 11/30/15. Glenda voiced understanding. She states he is doing ok at this time

## 2018-12-30 ENCOUNTER — Other Ambulatory Visit: Payer: Medicare Other

## 2018-12-30 ENCOUNTER — Other Ambulatory Visit: Payer: Self-pay

## 2018-12-30 ENCOUNTER — Encounter: Payer: Self-pay | Admitting: Internal Medicine

## 2018-12-30 ENCOUNTER — Ambulatory Visit: Payer: Medicare Other

## 2018-12-30 ENCOUNTER — Inpatient Hospital Stay (HOSPITAL_BASED_OUTPATIENT_CLINIC_OR_DEPARTMENT_OTHER): Payer: Medicare Other | Admitting: Internal Medicine

## 2018-12-30 ENCOUNTER — Ambulatory Visit: Payer: Medicare Other | Admitting: Internal Medicine

## 2018-12-30 ENCOUNTER — Inpatient Hospital Stay: Payer: Medicare Other

## 2018-12-30 VITALS — BP 114/68 | HR 60 | Temp 98.7°F | Resp 17 | Ht >= 80 in | Wt 281.0 lb

## 2018-12-30 DIAGNOSIS — C3491 Malignant neoplasm of unspecified part of right bronchus or lung: Secondary | ICD-10-CM

## 2018-12-30 DIAGNOSIS — C7989 Secondary malignant neoplasm of other specified sites: Secondary | ICD-10-CM | POA: Diagnosis not present

## 2018-12-30 DIAGNOSIS — I10 Essential (primary) hypertension: Secondary | ICD-10-CM

## 2018-12-30 DIAGNOSIS — R5382 Chronic fatigue, unspecified: Secondary | ICD-10-CM

## 2018-12-30 DIAGNOSIS — Z720 Tobacco use: Secondary | ICD-10-CM

## 2018-12-30 DIAGNOSIS — Z5112 Encounter for antineoplastic immunotherapy: Secondary | ICD-10-CM | POA: Diagnosis not present

## 2018-12-30 LAB — CMP (CANCER CENTER ONLY)
ALT: 14 U/L (ref 0–44)
AST: 16 U/L (ref 15–41)
Albumin: 3.8 g/dL (ref 3.5–5.0)
Alkaline Phosphatase: 94 U/L (ref 38–126)
Anion gap: 7 (ref 5–15)
BUN: 13 mg/dL (ref 8–23)
CO2: 27 mmol/L (ref 22–32)
Calcium: 9.2 mg/dL (ref 8.9–10.3)
Chloride: 105 mmol/L (ref 98–111)
Creatinine: 0.98 mg/dL (ref 0.61–1.24)
GFR, Est AFR Am: >60 mL/min (ref >60)
GFR, Estimated: >60 mL/min (ref >60)
Glucose, Bld: 102 mg/dL — ABNORMAL HIGH (ref 70–99)
Potassium: 4 mmol/L (ref 3.5–5.1)
Sodium: 139 mmol/L (ref 135–145)
Total Bilirubin: 1.2 mg/dL (ref 0.3–1.2)
Total Protein: 6.9 g/dL (ref 6.5–8.1)

## 2018-12-30 LAB — CBC WITH DIFFERENTIAL (CANCER CENTER ONLY)
Abs Immature Granulocytes: 0.01 10*3/uL (ref 0.00–0.07)
Basophils Absolute: 0.1 10*3/uL (ref 0.0–0.1)
Basophils Relative: 1 %
Eosinophils Absolute: 0.2 10*3/uL (ref 0.0–0.5)
Eosinophils Relative: 3 %
HCT: 43.8 % (ref 39.0–52.0)
Hemoglobin: 14.6 g/dL (ref 13.0–17.0)
Immature Granulocytes: 0 %
Lymphocytes Relative: 31 %
Lymphs Abs: 2.3 10*3/uL (ref 0.7–4.0)
MCH: 28.5 pg (ref 26.0–34.0)
MCHC: 33.3 g/dL (ref 30.0–36.0)
MCV: 85.5 fL (ref 80.0–100.0)
Monocytes Absolute: 0.7 10*3/uL (ref 0.1–1.0)
Monocytes Relative: 10 %
Neutro Abs: 4.1 10*3/uL (ref 1.7–7.7)
Neutrophils Relative %: 55 %
Platelet Count: 201 10*3/uL (ref 150–400)
RBC: 5.12 MIL/uL (ref 4.22–5.81)
RDW: 13.2 % (ref 11.5–15.5)
WBC Count: 7.3 10*3/uL (ref 4.0–10.5)
nRBC: 0 % (ref 0.0–0.2)

## 2018-12-30 LAB — TSH: TSH: 4.108 u[IU]/mL (ref 0.320–4.118)

## 2018-12-30 MED ORDER — SODIUM CHLORIDE 0.9 % IV SOLN
200.0000 mg | Freq: Once | INTRAVENOUS | Status: AC
  Administered 2018-12-30: 13:00:00 200 mg via INTRAVENOUS
  Filled 2018-12-30: qty 8

## 2018-12-30 MED ORDER — SODIUM CHLORIDE 0.9 % IV SOLN
Freq: Once | INTRAVENOUS | Status: AC
  Administered 2018-12-30: 12:00:00 via INTRAVENOUS
  Filled 2018-12-30: qty 250

## 2018-12-30 NOTE — Patient Instructions (Signed)
[facility] Discharge Instructions for Patients Receiving Chemotherapy  Today you received the following chemotherapy agents:  pembrolizumab  To help prevent nausea and vomiting after your treatment, we encourage you to take your nausea medication as prescribed.   If you develop nausea and vomiting that is not controlled by your nausea medication, call the clinic.   BELOW ARE SYMPTOMS THAT SHOULD BE REPORTED IMMEDIATELY:  *FEVER GREATER THAN 100.5 F  *CHILLS WITH OR WITHOUT FEVER  NAUSEA AND VOMITING THAT IS NOT CONTROLLED WITH YOUR NAUSEA MEDICATION  *UNUSUAL SHORTNESS OF BREATH  *UNUSUAL BRUISING OR BLEEDING  TENDERNESS IN MOUTH AND THROAT WITH OR WITHOUT PRESENCE OF ULCERS  *URINARY PROBLEMS  *BOWEL PROBLEMS  UNUSUAL RASH Items with * indicate a potential emergency and should be followed up as soon as possible.  Feel free to call the clinic should you have any questions or concerns. The clinic phone number is [phone].  Please show the [facility] at check-in to the Emergency Department and triage nurse.

## 2018-12-30 NOTE — Progress Notes (Signed)
[facility] Telephone:(336) [phone]   Fax:(336) [phone]  OFFICE PROGRESS NOTE  Sheryle Hail, PA-C [address] [place] [address]  DIAGNOSIS: Stage IV (T2a,N3,M1c)non-small cell lung cancer, squamous cell carcinoma presented with right upper lobe lung mass in addition to right hilar and mediastinal adenopathy as well as metastatic disease in the medial right thigh diagnosed in November 2018.  PRIOR THERAPY:  1) Palliative radiotherapy to the right lower extremity mass completed April 09, 2017. 2) Systemic chemotherapy with carboplatin for AUC of 5, paclitaxel 175 mg/M2 and Keytruda 200 mg IV every 3 weeks.  First dose March 12, 2017, status post 4 cycles with partial response.  CURRENT THERAPY: Maintenance treatment with single agent Keytruda 200 mg IV every 3 weeks.  First cycle June 11, 2017.  Status post 31 cycles.  INTERVAL HISTORY: Aaron Sellers 68 y.o. male returns to the clinic today for follow-up visit.  The patient is feeling fine today with no concerning complaints except for right-sided neck pain with radiation to the right shoulder.  This is started a week ago.  He is trying some ibuprofen with mild relief.  He denied having any palpable lymphadenopathy in this area.  He denied having any chest pain, shortness of breath, cough or hemoptysis.  He denied having any fever or chills.  He has no nausea, vomiting, diarrhea or constipation.  He denied having any headache or visual changes.  The patient is here today for evaluation before starting cycle #32 of his treatment with Keytruda.  MEDICAL HISTORY: Past Medical History:  Diagnosis Date  . Arthritis   . Atrial flutter ([facility])   . BPH (benign prostatic hypertrophy) with urinary retention   . Cancer ([facility])   . Chronic kidney disease    hx of kidney stones  2011  . COPD (chronic obstructive pulmonary disease) ([facility])    has been smoking since he was 22 ish--  . High cholesterol   .  Hypertension    dx around 2011  . Squamous cell carcinoma of right lung ([facility])     ALLERGIES:  is allergic to ciprofloxacin; adhesive [tape]; and codeine.  MEDICATIONS:  Current Outpatient Medications  Medication Sig Dispense Refill  . acetaminophen (TYLENOL) 500 MG tablet Take 1,000 mg by mouth every 6 (six) hours as needed for moderate pain.    Marland Kitchen atorvastatin (LIPITOR) 10 MG tablet Take 10 mg by mouth every evening.     Marland Kitchen CARTIA XT 180 MG 24 hr capsule Take 1 capsule by mouth once daily 90 capsule 0  . Cyanocobalamin (VITAMIN B-12 PO) Take 1 tablet by mouth daily.    . famotidine (PEPCID) 10 MG tablet Take 10 mg by mouth as needed for heartburn or indigestion.    . furosemide (LASIX) 40 MG tablet Take 1 tablet (40 mg total) by mouth daily as needed. 90 tablet 3  . gabapentin (NEURONTIN) 300 MG capsule TAKE 1 CAPSULE BY MOUTH THREE TIMES DAILY 90 capsule 0  . loratadine (CLARITIN) 10 MG tablet Take 10 mg by mouth daily.    . tamsulosin (FLOMAX) 0.4 MG CAPS capsule Take 0.4 mg by mouth daily.     No current facility-administered medications for this visit.    Facility-Administered Medications Ordered in Other Visits  Medication Dose Route Frequency Provider Last Rate Last Dose  . heparin lock flush 100 unit/mL  500 Units Intracatheter Once PRN Curt Bears, MD      . sodium chloride flush (NS) 0.9 %  injection 10 mL  10 mL Intracatheter PRN Curt Bears, MD        SURGICAL HISTORY:  Past Surgical History:  Procedure Laterality Date  . HERNIA REPAIR     umbilical hernia  04/6107  . INCISION AND DRAINAGE ABSCESS     "down the middle" of his buttocks  2015  . TOTAL KNEE ARTHROPLASTY Left 10/29/2014   Procedure: TOTAL KNEE ARTHROPLASTY;  Surgeon: Dorna Leitz, MD;  Location: [facility];  Service: Orthopedics;  Laterality: Left;    REVIEW OF SYSTEMS:  A comprehensive review of systems was negative except for: Musculoskeletal: positive for neck pain   PHYSICAL EXAMINATION: General  appearance: alert, cooperative and no distress Head: Normocephalic, without obvious abnormality, atraumatic Neck: no adenopathy, no JVD, supple, symmetrical, trachea midline and thyroid not enlarged, symmetric, no tenderness/mass/nodules Lymph nodes: Cervical, supraclavicular, and axillary nodes normal. Resp: clear to auscultation bilaterally Back: symmetric, no curvature. ROM normal. No CVA tenderness. Cardio: regular rate and rhythm, S1, S2 normal, no murmur, click, rub or gallop GI: soft, non-tender; bowel sounds normal; no masses,  no organomegaly Extremities: extremities normal, atraumatic, no cyanosis or edema  ECOG PERFORMANCE STATUS: 1 - Symptomatic but completely ambulatory  Blood pressure 114/68, pulse 60, temperature 98.7 F (37.1 C), temperature source Oral, resp. rate 17, height 6\' 8"  (2.032 m), weight 281 lb (127.5 kg), SpO2 97 %.  LABORATORY DATA: Lab Results  Component Value Date   WBC 7.3 12/30/2018   HGB 14.6 12/30/2018   HCT 43.8 12/30/2018   MCV 85.5 12/30/2018   PLT 201 12/30/2018      Chemistry      Component Value Date/Time   NA 139 12/30/2018 1017   NA 137 04/03/2017 1030   K 4.0 12/30/2018 1017   K 4.1 04/03/2017 1030   CL 105 12/30/2018 1017   CO2 27 12/30/2018 1017   CO2 27 04/03/2017 1030   BUN 13 12/30/2018 1017   BUN 15.5 04/03/2017 1030   CREATININE 0.98 12/30/2018 1017   CREATININE 0.8 04/03/2017 1030      Component Value Date/Time   CALCIUM 9.2 12/30/2018 1017   CALCIUM 9.4 04/03/2017 1030   ALKPHOS 94 12/30/2018 1017   ALKPHOS 97 04/03/2017 1030   AST 16 12/30/2018 1017   AST 14 04/03/2017 1030   ALT 14 12/30/2018 1017   ALT 21 04/03/2017 1030   BILITOT 1.2 12/30/2018 1017   BILITOT 0.67 04/03/2017 1030       RADIOGRAPHIC STUDIES: No results found.  ASSESSMENT AND PLAN: This is a very pleasant 68 years old white male with a stage IV non-small cell lung cancer, squamous cell carcinoma.  The patient is currently undergoing  systemic chemotherapy with carboplatin, paclitaxel and Keytruda status post 4 cycles with partial response. He is currently undergoing maintenance treatment with single agent Keytruda status post 31 cycles. The patient has been tolerating this treatment well with no concerning adverse effects. I recommended for him to proceed with cycle #32 today as planned. For the right neck pain, this is likely secondary to degenerative disc disease, he will continue on ibuprofen for now.  I will consider adding CT scan of the neck with his next staging scans in a few weeks. I will see him back for follow-up visit in 3 weeks for evaluation before the next cycle of his treatment. He was advised to call immediately if he has any concerning symptoms in the interval. The patient voices understanding of current disease status and treatment options and  is in agreement with the current care plan. All questions were answered. The patient knows to call the clinic with any problems, questions or concerns. We can certainly see the patient much sooner if necessary.  Disclaimer: This note was dictated with voice recognition software. Similar sounding words can inadvertently be transcribed and may not be corrected upon review.

## 2019-01-19 ENCOUNTER — Other Ambulatory Visit: Payer: Self-pay | Admitting: Internal Medicine

## 2019-01-19 DIAGNOSIS — G6289 Other specified polyneuropathies: Secondary | ICD-10-CM

## 2019-01-20 ENCOUNTER — Inpatient Hospital Stay: Payer: Medicare Other | Attending: Oncology

## 2019-01-20 ENCOUNTER — Inpatient Hospital Stay (HOSPITAL_BASED_OUTPATIENT_CLINIC_OR_DEPARTMENT_OTHER): Payer: Medicare Other | Admitting: Internal Medicine

## 2019-01-20 ENCOUNTER — Other Ambulatory Visit: Payer: Medicare Other

## 2019-01-20 ENCOUNTER — Other Ambulatory Visit: Payer: Self-pay | Admitting: *Deleted

## 2019-01-20 ENCOUNTER — Inpatient Hospital Stay: Payer: Medicare Other

## 2019-01-20 ENCOUNTER — Ambulatory Visit: Payer: Medicare Other | Admitting: Internal Medicine

## 2019-01-20 ENCOUNTER — Encounter: Payer: Self-pay | Admitting: Internal Medicine

## 2019-01-20 ENCOUNTER — Ambulatory Visit: Payer: Medicare Other

## 2019-01-20 ENCOUNTER — Other Ambulatory Visit: Payer: Self-pay

## 2019-01-20 ENCOUNTER — Other Ambulatory Visit: Payer: Self-pay | Admitting: Internal Medicine

## 2019-01-20 VITALS — BP 127/73 | HR 64 | Temp 98.0°F | Resp 17 | Ht >= 80 in | Wt 280.0 lb

## 2019-01-20 DIAGNOSIS — E7800 Pure hypercholesterolemia, unspecified: Secondary | ICD-10-CM | POA: Insufficient documentation

## 2019-01-20 DIAGNOSIS — J449 Chronic obstructive pulmonary disease, unspecified: Secondary | ICD-10-CM | POA: Diagnosis not present

## 2019-01-20 DIAGNOSIS — N189 Chronic kidney disease, unspecified: Secondary | ICD-10-CM | POA: Insufficient documentation

## 2019-01-20 DIAGNOSIS — C3490 Malignant neoplasm of unspecified part of unspecified bronchus or lung: Secondary | ICD-10-CM | POA: Diagnosis not present

## 2019-01-20 DIAGNOSIS — Z5112 Encounter for antineoplastic immunotherapy: Secondary | ICD-10-CM

## 2019-01-20 DIAGNOSIS — I129 Hypertensive chronic kidney disease with stage 1 through stage 4 chronic kidney disease, or unspecified chronic kidney disease: Secondary | ICD-10-CM | POA: Insufficient documentation

## 2019-01-20 DIAGNOSIS — C3411 Malignant neoplasm of upper lobe, right bronchus or lung: Secondary | ICD-10-CM | POA: Diagnosis not present

## 2019-01-20 DIAGNOSIS — Z923 Personal history of irradiation: Secondary | ICD-10-CM | POA: Insufficient documentation

## 2019-01-20 DIAGNOSIS — Z79899 Other long term (current) drug therapy: Secondary | ICD-10-CM | POA: Diagnosis not present

## 2019-01-20 DIAGNOSIS — R5382 Chronic fatigue, unspecified: Secondary | ICD-10-CM

## 2019-01-20 DIAGNOSIS — C3491 Malignant neoplasm of unspecified part of right bronchus or lung: Secondary | ICD-10-CM | POA: Diagnosis not present

## 2019-01-20 DIAGNOSIS — C7951 Secondary malignant neoplasm of bone: Secondary | ICD-10-CM | POA: Insufficient documentation

## 2019-01-20 DIAGNOSIS — Z87442 Personal history of urinary calculi: Secondary | ICD-10-CM | POA: Diagnosis not present

## 2019-01-20 DIAGNOSIS — M1990 Unspecified osteoarthritis, unspecified site: Secondary | ICD-10-CM | POA: Insufficient documentation

## 2019-01-20 DIAGNOSIS — Z9221 Personal history of antineoplastic chemotherapy: Secondary | ICD-10-CM | POA: Insufficient documentation

## 2019-01-20 DIAGNOSIS — I10 Essential (primary) hypertension: Secondary | ICD-10-CM

## 2019-01-20 LAB — TSH: TSH: 4.432 u[IU]/mL — ABNORMAL HIGH (ref 0.320–4.118)

## 2019-01-20 LAB — CMP (CANCER CENTER ONLY)
ALT: 16 U/L (ref 0–44)
AST: 18 U/L (ref 15–41)
Albumin: 3.7 g/dL (ref 3.5–5.0)
Alkaline Phosphatase: 93 U/L (ref 38–126)
Anion gap: 10 (ref 5–15)
BUN: 13 mg/dL (ref 8–23)
CO2: 26 mmol/L (ref 22–32)
Calcium: 9.5 mg/dL (ref 8.9–10.3)
Chloride: 104 mmol/L (ref 98–111)
Creatinine: 0.97 mg/dL (ref 0.61–1.24)
GFR, Est AFR Am: >60 mL/min (ref >60)
GFR, Estimated: >60 mL/min (ref >60)
Glucose, Bld: 103 mg/dL — ABNORMAL HIGH (ref 70–99)
Potassium: 4 mmol/L (ref 3.5–5.1)
Sodium: 140 mmol/L (ref 135–145)
Total Bilirubin: 0.9 mg/dL (ref 0.3–1.2)
Total Protein: 7 g/dL (ref 6.5–8.1)

## 2019-01-20 LAB — CBC WITH DIFFERENTIAL (CANCER CENTER ONLY)
Abs Immature Granulocytes: 0.01 10*3/uL (ref 0.00–0.07)
Basophils Absolute: 0.1 10*3/uL (ref 0.0–0.1)
Basophils Relative: 1 %
Eosinophils Absolute: 0.2 10*3/uL (ref 0.0–0.5)
Eosinophils Relative: 2 %
HCT: 44.1 % (ref 39.0–52.0)
Hemoglobin: 14.8 g/dL (ref 13.0–17.0)
Immature Granulocytes: 0 %
Lymphocytes Relative: 27 %
Lymphs Abs: 2.2 10*3/uL (ref 0.7–4.0)
MCH: 28.8 pg (ref 26.0–34.0)
MCHC: 33.6 g/dL (ref 30.0–36.0)
MCV: 85.8 fL (ref 80.0–100.0)
Monocytes Absolute: 0.8 10*3/uL (ref 0.1–1.0)
Monocytes Relative: 9 %
Neutro Abs: 4.8 10*3/uL (ref 1.7–7.7)
Neutrophils Relative %: 61 %
Platelet Count: 177 10*3/uL (ref 150–400)
RBC: 5.14 MIL/uL (ref 4.22–5.81)
RDW: 13.4 % (ref 11.5–15.5)
WBC Count: 8 10*3/uL (ref 4.0–10.5)
nRBC: 0 % (ref 0.0–0.2)

## 2019-01-20 MED ORDER — SODIUM CHLORIDE 0.9 % IV SOLN
200.0000 mg | Freq: Once | INTRAVENOUS | Status: AC
  Administered 2019-01-20: 15:00:00 200 mg via INTRAVENOUS
  Filled 2019-01-20: qty 8

## 2019-01-20 MED ORDER — SODIUM CHLORIDE 0.9 % IV SOLN
Freq: Once | INTRAVENOUS | Status: AC
  Administered 2019-01-20: 15:00:00 via INTRAVENOUS
  Filled 2019-01-20: qty 250

## 2019-01-20 NOTE — Progress Notes (Signed)
[place] [phone]   [phone]  OFFICE PROGRESS NOTE  Sheryle Hail, PA-C [address] [place] [address]  DIAGNOSIS: Stage IV (T2a,N3,M1c)non-small cell lung cancer, squamous cell carcinoma presented with right upper lobe lung mass in addition to right hilar and mediastinal adenopathy as well as metastatic disease in the medial right thigh diagnosed in November 2018.  PRIOR THERAPY:  1) Palliative radiotherapy to the right lower extremity mass completed April 09, 2017. 2) Systemic chemotherapy with carboplatin for AUC of 5, paclitaxel 175 mg/M2 and Keytruda 200 mg IV every 3 weeks.  First dose March 12, 2017, status post 4 cycles with partial response.  CURRENT THERAPY: Maintenance treatment with single agent Keytruda 200 mg IV every 3 weeks.  First cycle June 11, 2017.  Status post 32 cycles.  INTERVAL HISTORY: Aaron Sellers 68 y.o. male returns to the clinic today for follow-up visit.  The patient is feeling fine today with no concerning complaints.  He denied having any nausea, vomiting, diarrhea or constipation.  He denied having any headache or visual changes.  He has no chest pain, shortness of breath, cough or hemoptysis.  Denied having any recent weight loss or night sweats.  He continues to tolerate his treatment with Keytruda fairly well.  The patient is here today for evaluation before starting cycle #33.  MEDICAL HISTORY: Past Medical History:  Diagnosis Date  . Arthritis   . Atrial flutter ([facility])   . BPH (benign prostatic hypertrophy) with urinary retention   . Cancer ([facility])   . Chronic kidney disease    hx of kidney stones  2011  . COPD (chronic obstructive pulmonary disease) ([facility])    has been smoking since he was 22 ish--  . High cholesterol   . Hypertension    dx around 2011  . Squamous cell carcinoma of right lung ([facility])     ALLERGIES:  is allergic to ciprofloxacin; adhesive [tape]; and codeine.   MEDICATIONS:  Current Outpatient Medications  Medication Sig Dispense Refill  . acetaminophen (TYLENOL) 500 MG tablet Take 1,000 mg by mouth every 6 (six) hours as needed for moderate pain.    Marland Kitchen atorvastatin (LIPITOR) 10 MG tablet Take 10 mg by mouth every evening.     Marland Kitchen CARTIA XT 180 MG 24 hr capsule Take 1 capsule by mouth once daily 90 capsule 0  . Cyanocobalamin (VITAMIN B-12 PO) Take 1 tablet by mouth daily.    . famotidine (PEPCID) 10 MG tablet Take 10 mg by mouth as needed for heartburn or indigestion.    . furosemide (LASIX) 40 MG tablet Take 1 tablet (40 mg total) by mouth daily as needed. 90 tablet 3  . gabapentin (NEURONTIN) 300 MG capsule TAKE 1 CAPSULE BY MOUTH THREE TIMES DAILY 90 capsule 0  . loratadine (CLARITIN) 10 MG tablet Take 10 mg by mouth daily.    . tamsulosin (FLOMAX) 0.4 MG CAPS capsule Take 0.4 mg by mouth daily.     No current facility-administered medications for this visit.    Facility-Administered Medications Ordered in Other Visits  Medication Dose Route Frequency Provider Last Rate Last Dose  . heparin lock flush 100 unit/mL  500 Units Intracatheter Once PRN Curt Bears, MD      . sodium chloride flush (NS) 0.9 % injection 10 mL  10 mL Intracatheter PRN Curt Bears, MD        SURGICAL HISTORY:  Past Surgical History:  Procedure Laterality Date  .  HERNIA REPAIR     umbilical hernia  12/4763  . INCISION AND DRAINAGE ABSCESS     "down the middle" of his buttocks  2015  . TOTAL KNEE ARTHROPLASTY Left 10/29/2014   Procedure: TOTAL KNEE ARTHROPLASTY;  Surgeon: Dorna Leitz, MD;  Location: [facility];  Service: Orthopedics;  Laterality: Left;    REVIEW OF SYSTEMS:  A comprehensive review of systems was negative.   PHYSICAL EXAMINATION: General appearance: alert, cooperative and no distress Head: Normocephalic, without obvious abnormality, atraumatic Neck: no adenopathy, no JVD, supple, symmetrical, trachea midline and thyroid not enlarged, symmetric,  no tenderness/mass/nodules Lymph nodes: Cervical, supraclavicular, and axillary nodes normal. Resp: clear to auscultation bilaterally Back: symmetric, no curvature. ROM normal. No CVA tenderness. Cardio: regular rate and rhythm, S1, S2 normal, no murmur, click, rub or gallop GI: soft, non-tender; bowel sounds normal; no masses,  no organomegaly Extremities: extremities normal, atraumatic, no cyanosis or edema  ECOG PERFORMANCE STATUS: 1 - Symptomatic but completely ambulatory  Blood pressure 127/73, pulse 64, temperature 98 F (36.7 C), temperature source Oral, resp. rate 17, height 6\' 8"  (2.032 m), weight 280 lb (127 kg), SpO2 100 %.  LABORATORY DATA: Lab Results  Component Value Date   WBC 8.0 01/20/2019   HGB 14.8 01/20/2019   HCT 44.1 01/20/2019   MCV 85.8 01/20/2019   PLT 177 01/20/2019      Chemistry      Component Value Date/Time   NA 139 12/30/2018 1017   NA 137 04/03/2017 1030   K 4.0 12/30/2018 1017   K 4.1 04/03/2017 1030   CL 105 12/30/2018 1017   CO2 27 12/30/2018 1017   CO2 27 04/03/2017 1030   BUN 13 12/30/2018 1017   BUN 15.5 04/03/2017 1030   CREATININE 0.98 12/30/2018 1017   CREATININE 0.8 04/03/2017 1030      Component Value Date/Time   CALCIUM 9.2 12/30/2018 1017   CALCIUM 9.4 04/03/2017 1030   ALKPHOS 94 12/30/2018 1017   ALKPHOS 97 04/03/2017 1030   AST 16 12/30/2018 1017   AST 14 04/03/2017 1030   ALT 14 12/30/2018 1017   ALT 21 04/03/2017 1030   BILITOT 1.2 12/30/2018 1017   BILITOT 0.67 04/03/2017 1030       RADIOGRAPHIC STUDIES: No results found.  ASSESSMENT AND PLAN: This is a very pleasant 68 years old white male with a stage IV non-small cell lung cancer, squamous cell carcinoma.  The patient is currently undergoing systemic chemotherapy with carboplatin, paclitaxel and Keytruda status post 4 cycles with partial response. He is currently undergoing maintenance treatment with single agent Keytruda status post 32 cycles. The patient  continues to tolerate his treatment well with no concerning adverse effects. I recommended for him to proceed with cycle #33 today as planned. I will see him back for follow-up visit in 3 weeks for evaluation after repeating CT scan of the chest, abdomen and pelvis for restaging of his disease. He was advised to call immediately if he has any concerning symptoms in the interval. The patient voices understanding of current disease status and treatment options and is in agreement with the current care plan. All questions were answered. The patient knows to call the clinic with any problems, questions or concerns. We can certainly see the patient much sooner if necessary.  Disclaimer: This note was dictated with voice recognition software. Similar sounding words can inadvertently be transcribed and may not be corrected upon review.

## 2019-01-20 NOTE — Patient Instructions (Signed)
[facility] Discharge Instructions for Patients Receiving Chemotherapy  Today you received the following chemotherapy agents:  Keytruda.  To help prevent nausea and vomiting after your treatment, we encourage you to take your nausea medication as directed.   If you develop nausea and vomiting that is not controlled by your nausea medication, call the clinic.   BELOW ARE SYMPTOMS THAT SHOULD BE REPORTED IMMEDIATELY:  *FEVER GREATER THAN 100.5 F  *CHILLS WITH OR WITHOUT FEVER  NAUSEA AND VOMITING THAT IS NOT CONTROLLED WITH YOUR NAUSEA MEDICATION  *UNUSUAL SHORTNESS OF BREATH  *UNUSUAL BRUISING OR BLEEDING  TENDERNESS IN MOUTH AND THROAT WITH OR WITHOUT PRESENCE OF ULCERS  *URINARY PROBLEMS  *BOWEL PROBLEMS  UNUSUAL RASH Items with * indicate a potential emergency and should be followed up as soon as possible.  Feel free to call the clinic should you have any questions or concerns. The clinic phone number is [phone].  Please show the CHEMO ALERT CARD at check-in to the Emergency Department and triage nurse.    

## 2019-01-21 ENCOUNTER — Telehealth: Payer: Self-pay | Admitting: Internal Medicine

## 2019-01-21 NOTE — Telephone Encounter (Signed)
Per 10/20 los, treatment appt already scheduled.

## 2019-02-06 ENCOUNTER — Other Ambulatory Visit: Payer: Self-pay

## 2019-02-06 ENCOUNTER — Ambulatory Visit (HOSPITAL_COMMUNITY)
Admission: RE | Admit: 2019-02-06 | Discharge: 2019-02-06 | Disposition: A | Discharge status: Home / Self Care | Payer: Medicare Other | Source: Ambulatory Visit | Attending: Internal Medicine | Admitting: Internal Medicine

## 2019-02-06 DIAGNOSIS — C3490 Malignant neoplasm of unspecified part of unspecified bronchus or lung: Secondary | ICD-10-CM | POA: Insufficient documentation

## 2019-02-06 MED ORDER — IOHEXOL 300 MG/ML  SOLN
100.0000 mL | Freq: Once | INTRAMUSCULAR | Status: AC | PRN
  Administered 2019-02-06: 100 mL via INTRAVENOUS

## 2019-02-06 MED ORDER — SODIUM CHLORIDE (PF) 0.9 % IJ SOLN
INTRAMUSCULAR | Status: AC
  Filled 2019-02-06: qty 50

## 2019-02-09 ENCOUNTER — Other Ambulatory Visit: Payer: Self-pay | Admitting: Medical Oncology

## 2019-02-09 DIAGNOSIS — R5382 Chronic fatigue, unspecified: Secondary | ICD-10-CM

## 2019-02-09 DIAGNOSIS — C3491 Malignant neoplasm of unspecified part of right bronchus or lung: Secondary | ICD-10-CM

## 2019-02-10 ENCOUNTER — Encounter: Payer: Self-pay | Admitting: Internal Medicine

## 2019-02-10 ENCOUNTER — Other Ambulatory Visit: Payer: Self-pay

## 2019-02-10 ENCOUNTER — Inpatient Hospital Stay: Payer: Medicare Other

## 2019-02-10 ENCOUNTER — Inpatient Hospital Stay (HOSPITAL_BASED_OUTPATIENT_CLINIC_OR_DEPARTMENT_OTHER): Payer: Medicare Other | Admitting: Internal Medicine

## 2019-02-10 ENCOUNTER — Inpatient Hospital Stay: Payer: Medicare Other | Attending: Oncology

## 2019-02-10 VITALS — BP 139/72 | HR 60 | Temp 98.3°F | Resp 18 | Ht >= 80 in | Wt 281.9 lb

## 2019-02-10 DIAGNOSIS — N189 Chronic kidney disease, unspecified: Secondary | ICD-10-CM | POA: Diagnosis not present

## 2019-02-10 DIAGNOSIS — Z5112 Encounter for antineoplastic immunotherapy: Secondary | ICD-10-CM | POA: Diagnosis present

## 2019-02-10 DIAGNOSIS — C3491 Malignant neoplasm of unspecified part of right bronchus or lung: Secondary | ICD-10-CM | POA: Diagnosis not present

## 2019-02-10 DIAGNOSIS — Z87891 Personal history of nicotine dependence: Secondary | ICD-10-CM | POA: Insufficient documentation

## 2019-02-10 DIAGNOSIS — Z79899 Other long term (current) drug therapy: Secondary | ICD-10-CM | POA: Diagnosis not present

## 2019-02-10 DIAGNOSIS — J449 Chronic obstructive pulmonary disease, unspecified: Secondary | ICD-10-CM | POA: Diagnosis not present

## 2019-02-10 DIAGNOSIS — R5382 Chronic fatigue, unspecified: Secondary | ICD-10-CM

## 2019-02-10 DIAGNOSIS — I10 Essential (primary) hypertension: Secondary | ICD-10-CM | POA: Diagnosis not present

## 2019-02-10 DIAGNOSIS — C7989 Secondary malignant neoplasm of other specified sites: Secondary | ICD-10-CM | POA: Insufficient documentation

## 2019-02-10 DIAGNOSIS — Z720 Tobacco use: Secondary | ICD-10-CM

## 2019-02-10 DIAGNOSIS — E7800 Pure hypercholesterolemia, unspecified: Secondary | ICD-10-CM | POA: Insufficient documentation

## 2019-02-10 DIAGNOSIS — C3411 Malignant neoplasm of upper lobe, right bronchus or lung: Secondary | ICD-10-CM | POA: Diagnosis present

## 2019-02-10 LAB — CBC WITH DIFFERENTIAL (CANCER CENTER ONLY)
Abs Immature Granulocytes: 0.01 10*3/uL (ref 0.00–0.07)
Basophils Absolute: 0.1 10*3/uL (ref 0.0–0.1)
Basophils Relative: 1 %
Eosinophils Absolute: 0.2 10*3/uL (ref 0.0–0.5)
Eosinophils Relative: 3 %
HCT: 44.2 % (ref 39.0–52.0)
Hemoglobin: 14.7 g/dL (ref 13.0–17.0)
Immature Granulocytes: 0 %
Lymphocytes Relative: 29 %
Lymphs Abs: 1.9 10*3/uL (ref 0.7–4.0)
MCH: 28.4 pg (ref 26.0–34.0)
MCHC: 33.3 g/dL (ref 30.0–36.0)
MCV: 85.5 fL (ref 80.0–100.0)
Monocytes Absolute: 0.5 10*3/uL (ref 0.1–1.0)
Monocytes Relative: 8 %
Neutro Abs: 3.9 10*3/uL (ref 1.7–7.7)
Neutrophils Relative %: 59 %
Platelet Count: 185 10*3/uL (ref 150–400)
RBC: 5.17 MIL/uL (ref 4.22–5.81)
RDW: 13.3 % (ref 11.5–15.5)
WBC Count: 6.5 10*3/uL (ref 4.0–10.5)
nRBC: 0 % (ref 0.0–0.2)

## 2019-02-10 LAB — CMP (CANCER CENTER ONLY)
ALT: 15 U/L (ref 0–44)
AST: 15 U/L (ref 15–41)
Albumin: 3.7 g/dL (ref 3.5–5.0)
Alkaline Phosphatase: 104 U/L (ref 38–126)
Anion gap: 10 (ref 5–15)
BUN: 11 mg/dL (ref 8–23)
CO2: 25 mmol/L (ref 22–32)
Calcium: 8.9 mg/dL (ref 8.9–10.3)
Chloride: 105 mmol/L (ref 98–111)
Creatinine: 0.94 mg/dL (ref 0.61–1.24)
GFR, Est AFR Am: >60 mL/min (ref >60)
GFR, Estimated: >60 mL/min (ref >60)
Glucose, Bld: 156 mg/dL — ABNORMAL HIGH (ref 70–99)
Potassium: 3.8 mmol/L (ref 3.5–5.1)
Sodium: 140 mmol/L (ref 135–145)
Total Bilirubin: 0.8 mg/dL (ref 0.3–1.2)
Total Protein: 6.9 g/dL (ref 6.5–8.1)

## 2019-02-10 LAB — TSH: TSH: 5.157 u[IU]/mL — ABNORMAL HIGH (ref 0.320–4.118)

## 2019-02-10 MED ORDER — SODIUM CHLORIDE 0.9 % IV SOLN
200.0000 mg | Freq: Once | INTRAVENOUS | Status: AC
  Administered 2019-02-10: 200 mg via INTRAVENOUS
  Filled 2019-02-10: qty 8

## 2019-02-10 MED ORDER — SODIUM CHLORIDE 0.9 % IV SOLN
Freq: Once | INTRAVENOUS | Status: AC
  Administered 2019-02-10: 10:00:00 via INTRAVENOUS
  Filled 2019-02-10: qty 250

## 2019-02-10 MED ORDER — PREDNISONE 10 MG PO TABS: ORAL_TABLET | ORAL | 0 refills | Status: DC

## 2019-02-10 MED ORDER — DOXYCYCLINE HYCLATE 100 MG PO TABS: 100.0000 mg | ORAL_TABLET | Freq: Two times a day (BID) | ORAL | 0 refills | Status: DC

## 2019-02-10 NOTE — Patient Instructions (Signed)
Cancer Center Discharge Instructions for Patients Receiving Chemotherapy  Today you received the following chemotherapy agents Pembrolizumab (KEYTRUDA).  To help prevent nausea and vomiting after your treatment, we encourage you to take your nausea medication as prescribed.   If you develop nausea and vomiting that is not controlled by your nausea medication, call the clinic.   BELOW ARE SYMPTOMS THAT SHOULD BE REPORTED IMMEDIATELY:  *FEVER GREATER THAN 100.5 F  *CHILLS WITH OR WITHOUT FEVER  NAUSEA AND VOMITING THAT IS NOT CONTROLLED WITH YOUR NAUSEA MEDICATION  *UNUSUAL SHORTNESS OF BREATH  *UNUSUAL BRUISING OR BLEEDING  TENDERNESS IN MOUTH AND THROAT WITH OR WITHOUT PRESENCE OF ULCERS  *URINARY PROBLEMS  *BOWEL PROBLEMS  UNUSUAL RASH Items with * indicate a potential emergency and should be followed up as soon as possible.  Feel free to call the clinic should you have any questions or concerns. The clinic phone number is [phone].  Please show the CHEMO ALERT CARD at check-in to the Emergency Department and triage nurse.  Coronavirus (COVID-19) Are you at risk?  Are you at risk for the Coronavirus (COVID-19)?  To be considered HIGH RISK for Coronavirus (COVID-19), you have to meet the following criteria:  . Traveled to [place], [place], [place], [place] or [place]; or in the [place] to [place], [place], [place], or [place]; and have fever, cough, and shortness of breath within the last 2 weeks of travel OR . Been in close contact with a person diagnosed with COVID-19 within the last 2 weeks and have fever, cough, and shortness of breath . IF YOU DO NOT MEET THESE CRITERIA, YOU ARE CONSIDERED LOW RISK FOR COVID-19.  What to do if you are HIGH RISK for COVID-19?  . If you are having a medical emergency, call 911. . Seek medical care right away. Before you go to a doctor's office, urgent care or emergency department, call ahead and tell  them about your recent travel, contact with someone diagnosed with COVID-19, and your symptoms. You should receive instructions from your physician's office regarding next steps of care.  . When you arrive at healthcare provider, tell the healthcare staff immediately you have returned from visiting [place], [place], [place], [place] or [place]; or traveled in the [place] to [place], [place], [place], or [place]; in the last two weeks or you have been in close contact with a person diagnosed with COVID-19 in the last 2 weeks.   . Tell the health care staff about your symptoms: fever, cough and shortness of breath. . After you have been seen by a medical provider, you will be either: o Tested for (COVID-19) and discharged home on quarantine except to seek medical care if symptoms worsen, and asked to  - Stay home and avoid contact with others until you get your results (4-5 days)  - Avoid travel on public transportation if possible (such as bus, train, or airplane) or o Sent to the Emergency Department by EMS for evaluation, COVID-19 testing, and possible admission depending on your condition and test results.  What to do if you are LOW RISK for COVID-19?  Reduce your risk of any infection by using the same precautions used for avoiding the common cold or flu:  . Wash your hands often with soap and warm water for at least 20 seconds.  If soap and water are not readily available, use an alcohol-based hand sanitizer with at least 60% alcohol.  . If coughing or   sneezing, cover your mouth and nose by coughing or sneezing into the elbow areas of your shirt or coat, into a tissue or into your sleeve (not your hands). . Avoid shaking hands with others and consider head nods or verbal greetings only. . Avoid touching your eyes, nose, or mouth with unwashed hands.  . Avoid close contact with people who are sick. . Avoid places or events with large numbers of people in one location, like concerts or  sporting events. . Carefully consider travel plans you have or are making. . If you are planning any travel outside or inside the [place], visit the CDC's Travelers' Health webpage for the latest health notices. . If you have some symptoms but not all symptoms, continue to monitor at home and seek medical attention if your symptoms worsen. . If you are having a medical emergency, call 911.   ADDITIONAL HEALTHCARE OPTIONS FOR PATIENTS  [facility] / e-Visit: [email or web address]         [facility]: [phone]  [facility]: [phone]                   [facility]: [phone]    

## 2019-02-10 NOTE — Progress Notes (Signed)
[place] [phone]   [phone]  OFFICE PROGRESS NOTE  Sheryle Hail, PA-C [address] [place] [address]  DIAGNOSIS: Stage IV (T2a,N3,M1c)non-small cell lung cancer, squamous cell carcinoma presented with right upper lobe lung mass in addition to right hilar and mediastinal adenopathy as well as metastatic disease in the medial right thigh diagnosed in November 2018.  PRIOR THERAPY:  1) Palliative radiotherapy to the right lower extremity mass completed April 09, 2017. 2) Systemic chemotherapy with carboplatin for AUC of 5, paclitaxel 175 mg/M2 and Keytruda 200 mg IV every 3 weeks.  First dose March 12, 2017, status post 4 cycles with partial response.  CURRENT THERAPY: Maintenance treatment with single agent Keytruda 200 mg IV every 3 weeks.  First cycle June 11, 2017.  Status post 33 cycles.  INTERVAL HISTORY: Aaron Sellers 68 y.o. male returns to the clinic today for follow-up visit accompanied by his wife.  The patient is feeling fine today with no concerning complaints except for recent diagnosis of conjunctivitis and ear infection treated by his primary care physician by Keflex and Norel AD.  The patient denied having any current chest pain, shortness of breath but has mild cough with no hemoptysis.  He denied having any recent weight loss or night sweats.  He has no nausea, vomiting, diarrhea or constipation.  He has no headache or visual changes.  He had repeat CT scan of the chest, abdomen pelvis performed recently and he is here for evaluation and discussion of his scan results.   MEDICAL HISTORY: Past Medical History:  Diagnosis Date   Arthritis    Atrial flutter (HCC)    BPH (benign prostatic hypertrophy) with urinary retention    Cancer (HCC)    Chronic kidney disease    hx of kidney stones  2011   COPD (chronic obstructive pulmonary disease) (HCC)    has been smoking since he was 22 ish--   High  cholesterol    Hypertension    dx around 2011   Squamous cell carcinoma of right lung ([facility])     ALLERGIES:  is allergic to ciprofloxacin; adhesive [tape]; and codeine.  MEDICATIONS:  Current Outpatient Medications  Medication Sig Dispense Refill   acetaminophen (TYLENOL) 500 MG tablet Take 1,000 mg by mouth every 6 (six) hours as needed for moderate pain.     atorvastatin (LIPITOR) 10 MG tablet Take 10 mg by mouth every evening.      CARTIA XT 180 MG 24 hr capsule Take 1 capsule by mouth once daily 90 capsule 0   Cyanocobalamin (VITAMIN B-12 PO) Take 1 tablet by mouth daily.     famotidine (PEPCID) 10 MG tablet Take 10 mg by mouth as needed for heartburn or indigestion.     furosemide (LASIX) 40 MG tablet Take 1 tablet (40 mg total) by mouth daily as needed. 90 tablet 3   gabapentin (NEURONTIN) 300 MG capsule TAKE 1 CAPSULE BY MOUTH THREE TIMES DAILY 90 capsule 0   loratadine (CLARITIN) 10 MG tablet Take 10 mg by mouth daily.     tamsulosin (FLOMAX) 0.4 MG CAPS capsule Take 0.4 mg by mouth daily.     No current facility-administered medications for this visit.    Facility-Administered Medications Ordered in Other Visits  Medication Dose Route Frequency Provider Last Rate Last Dose   heparin lock flush 100 unit/mL  500 Units Intracatheter Once PRN Curt Bears, MD       sodium chloride  flush (NS) 0.9 % injection 10 mL  10 mL Intracatheter PRN Curt Bears, MD        SURGICAL HISTORY:  Past Surgical History:  Procedure Laterality Date   HERNIA REPAIR     umbilical hernia  05/3760   INCISION AND DRAINAGE ABSCESS     "down the middle" of his buttocks  2015   TOTAL KNEE ARTHROPLASTY Left 10/29/2014   Procedure: TOTAL KNEE ARTHROPLASTY;  Surgeon: Dorna Leitz, MD;  Location: [facility];  Service: Orthopedics;  Laterality: Left;    REVIEW OF SYSTEMS:  Constitutional: positive for fatigue Eyes: negative Ears, nose, mouth, throat, and face: negative Respiratory:  positive for cough Cardiovascular: negative Gastrointestinal: negative Genitourinary:negative Integument/breast: negative Hematologic/lymphatic: negative Musculoskeletal:negative Neurological: negative Behavioral/Psych: negative Endocrine: negative Allergic/Immunologic: negative   PHYSICAL EXAMINATION: General appearance: alert, cooperative and no distress Head: Normocephalic, without obvious abnormality, atraumatic Neck: no adenopathy, no JVD, supple, symmetrical, trachea midline and thyroid not enlarged, symmetric, no tenderness/mass/nodules Lymph nodes: Cervical, supraclavicular, and axillary nodes normal. Resp: clear to auscultation bilaterally Back: symmetric, no curvature. ROM normal. No CVA tenderness. Cardio: regular rate and rhythm, S1, S2 normal, no murmur, click, rub or gallop GI: soft, non-tender; bowel sounds normal; no masses,  no organomegaly Extremities: extremities normal, atraumatic, no cyanosis or edema Neurologic: Alert and oriented X 3, normal strength and tone. Normal symmetric reflexes. Normal coordination and gait  ECOG PERFORMANCE STATUS: 1 - Symptomatic but completely ambulatory  Blood pressure 139/72, pulse 60, temperature 98.3 F (36.8 C), temperature source Temporal, resp. rate 18, height 6\' 8"  (2.032 m), weight 281 lb 14.4 oz (127.9 kg), SpO2 98 %.  LABORATORY DATA: Lab Results  Component Value Date   WBC 6.5 02/10/2019   HGB 14.7 02/10/2019   HCT 44.2 02/10/2019   MCV 85.5 02/10/2019   PLT 185 02/10/2019      Chemistry      Component Value Date/Time   NA 140 01/20/2019 1330   NA 137 04/03/2017 1030   K 4.0 01/20/2019 1330   K 4.1 04/03/2017 1030   CL 104 01/20/2019 1330   CO2 26 01/20/2019 1330   CO2 27 04/03/2017 1030   BUN 13 01/20/2019 1330   BUN 15.5 04/03/2017 1030   CREATININE 0.97 01/20/2019 1330   CREATININE 0.8 04/03/2017 1030      Component Value Date/Time   CALCIUM 9.5 01/20/2019 1330   CALCIUM 9.4 04/03/2017 1030    ALKPHOS 93 01/20/2019 1330   ALKPHOS 97 04/03/2017 1030   AST 18 01/20/2019 1330   AST 14 04/03/2017 1030   ALT 16 01/20/2019 1330   ALT 21 04/03/2017 1030   BILITOT 0.9 01/20/2019 1330   BILITOT 0.67 04/03/2017 1030       RADIOGRAPHIC STUDIES: Ct Chest W Contrast  Result Date: 02/06/2019 CLINICAL DATA:  Patient with history of lung cancer. Follow-up exam. EXAM: CT CHEST, ABDOMEN, AND PELVIS WITH CONTRAST TECHNIQUE: Multidetector CT imaging of the chest, abdomen and pelvis was performed following the standard protocol during bolus administration of intravenous contrast. CONTRAST:  129mL OMNIPAQUE IOHEXOL 300 MG/ML  SOLN COMPARISON:  CT CAP 11/17/2018 FINDINGS: CT CHEST FINDINGS Cardiovascular: Normal heart size. Trace fluid superior pericardial recess. Thoracic aortic and coronary arterial vascular calcifications. Mediastinum/Nodes: Unchanged 1.2 cm subcarinal lymph node (image 30; series 2). Interval decrease in size of 1.0 cm left axillary node (image 19; series 2), previously 1.3 cm. No right axillary lymphadenopathy. Lungs/Pleura: Small amount of dependent mucus within the proximal right mainstem bronchus. Unchanged spiculated  nodule within the right upper lobe measuring 1.5 x 0.9 cm (image 44; series 6). Significant interval increase in bilateral scattered areas of organized appearing ground-glass. Ground-glass nodule within the lingula is increased in size measuring 2.3 x 2.2 cm (image 79; series 6), previously 1.6 x 1.4 cm. Interval development of centrally cavitary ground-glass nodules within the left upper lobe measuring 1.8 x 1.5 cm (image 39; series 6). No pleural effusion or pneumothorax. Musculoskeletal: Thoracic spine degenerative changes. No aggressive or acute appearing osseous lesions. CT ABDOMEN PELVIS FINDINGS Hepatobiliary: The liver is lobular in contour. No focal lesion identified. Gallbladder is unremarkable. No intrahepatic or extrahepatic biliary ductal dilatation. Pancreas:  Unremarkable Spleen: Unremarkable Adrenals/Urinary Tract: Stable 2.9 cm right adrenal nodule (image 61; series 2). Stable 1.9 cm right adrenal nodule (image 64; series 2). Kidneys enhance symmetrically with contrast. Unchanged small stone midpole right kidney. Right renal cyst. No hydronephrosis. Urinary bladder is unremarkable. Stomach/Bowel: Oral contrast material to the level of the rectum. Sigmoid colonic diverticulosis. No CT evidence for acute diverticulitis. Normal morphology of the stomach. No evidence for small bowel obstruction. No free fluid or free intraperitoneal air. Vascular/Lymphatic: Peripheral calcified atherosclerotic plaque involving the abdominal aorta. Infrarenal abdominal aorta measures 2.7 cm. No retroperitoneal lymphadenopathy. Similar-appearing 1.5 cm porta hepatic nodes. Reproductive: Heterogeneous prostate. Other: None. Musculoskeletal: Lumbar spine degenerative changes. No aggressive or acute appearing osseous lesions. IMPRESSION: 1. Interval increase in size and number of diffuse bilateral peribronchovascular ground-glass nodularity and consolidation which may be secondary to atypical/fungal pneumonia or metastatic disease. Additional consideration is immunotherapy related pneumonitis. 2. Stable spiculated right upper lobe nodule. 3. Small right renal stone. Infrarenal abdominal aortic ectasia. Recommend continued attention on follow-up exams. Electronically Signed   By: Lovey Newcomer M.D.   On: 02/06/2019 15:44   Ct Abdomen Pelvis W Contrast  Result Date: 02/06/2019 CLINICAL DATA:  Patient with history of lung cancer. Follow-up exam. EXAM: CT CHEST, ABDOMEN, AND PELVIS WITH CONTRAST TECHNIQUE: Multidetector CT imaging of the chest, abdomen and pelvis was performed following the standard protocol during bolus administration of intravenous contrast. CONTRAST:  143mL OMNIPAQUE IOHEXOL 300 MG/ML  SOLN COMPARISON:  CT CAP 11/17/2018 FINDINGS: CT CHEST FINDINGS Cardiovascular: Normal heart  size. Trace fluid superior pericardial recess. Thoracic aortic and coronary arterial vascular calcifications. Mediastinum/Nodes: Unchanged 1.2 cm subcarinal lymph node (image 30; series 2). Interval decrease in size of 1.0 cm left axillary node (image 19; series 2), previously 1.3 cm. No right axillary lymphadenopathy. Lungs/Pleura: Small amount of dependent mucus within the proximal right mainstem bronchus. Unchanged spiculated nodule within the right upper lobe measuring 1.5 x 0.9 cm (image 44; series 6). Significant interval increase in bilateral scattered areas of organized appearing ground-glass. Ground-glass nodule within the lingula is increased in size measuring 2.3 x 2.2 cm (image 79; series 6), previously 1.6 x 1.4 cm. Interval development of centrally cavitary ground-glass nodules within the left upper lobe measuring 1.8 x 1.5 cm (image 39; series 6). No pleural effusion or pneumothorax. Musculoskeletal: Thoracic spine degenerative changes. No aggressive or acute appearing osseous lesions. CT ABDOMEN PELVIS FINDINGS Hepatobiliary: The liver is lobular in contour. No focal lesion identified. Gallbladder is unremarkable. No intrahepatic or extrahepatic biliary ductal dilatation. Pancreas: Unremarkable Spleen: Unremarkable Adrenals/Urinary Tract: Stable 2.9 cm right adrenal nodule (image 61; series 2). Stable 1.9 cm right adrenal nodule (image 64; series 2). Kidneys enhance symmetrically with contrast. Unchanged small stone midpole right kidney. Right renal cyst. No hydronephrosis. Urinary bladder is unremarkable. Stomach/Bowel: Oral contrast material  to the level of the rectum. Sigmoid colonic diverticulosis. No CT evidence for acute diverticulitis. Normal morphology of the stomach. No evidence for small bowel obstruction. No free fluid or free intraperitoneal air. Vascular/Lymphatic: Peripheral calcified atherosclerotic plaque involving the abdominal aorta. Infrarenal abdominal aorta measures 2.7 cm. No  retroperitoneal lymphadenopathy. Similar-appearing 1.5 cm porta hepatic nodes. Reproductive: Heterogeneous prostate. Other: None. Musculoskeletal: Lumbar spine degenerative changes. No aggressive or acute appearing osseous lesions. IMPRESSION: 1. Interval increase in size and number of diffuse bilateral peribronchovascular ground-glass nodularity and consolidation which may be secondary to atypical/fungal pneumonia or metastatic disease. Additional consideration is immunotherapy related pneumonitis. 2. Stable spiculated right upper lobe nodule. 3. Small right renal stone. Infrarenal abdominal aortic ectasia. Recommend continued attention on follow-up exams. Electronically Signed   By: Lovey Newcomer M.D.   On: 02/06/2019 15:44    ASSESSMENT AND PLAN: This is a very pleasant 68 years old white male with a stage IV non-small cell lung cancer, squamous cell carcinoma.  The patient is currently undergoing systemic chemotherapy with carboplatin, paclitaxel and Keytruda status post 4 cycles with partial response. He is currently undergoing maintenance treatment with single agent Keytruda status post 33 cycles. The patient is tolerating this treatment fairly well with no concerning adverse effects. He had repeat CT scan of the chest, abdomen pelvis performed recently.  I personally and independently reviewed the scans and discussed the results with the patient and his wife. He has a scan showed no concerning findings for disease progression but there was interval increase in size and number of diffuse bilateral peribronchovascular groundglass nodularity suspicious for atypical pneumonia/metastatic disease but also could be immunotherapy mediated inflammatory process. I had a lengthy discussion with the patient and his wife about his condition and treatment options.  The patient was giving the option of discontinuing the treatment at this point and starting him on high-dose steroids for the suspicious pneumonitis  versus proceeding with the last 2 cycles of his treatment and the patient is currently asymptomatic.  The patient would like to continue his last 2 cycles of the treatment. He will proceed with cycle #34 today. For the suspicious inflammatory process I will start the patient empirically on doxycycline 100 mg p.o. twice daily for 2 weeks in addition to prednisone 10 mg p.o. daily for the next months.  The patient will discontinue his current treatment with Keflex that was started last night. He was advised to call immediately if he has any concerning symptoms in the interval. The patient voices understanding of current disease status and treatment options and is in agreement with the current care plan. All questions were answered. The patient knows to call the clinic with any problems, questions or concerns. We can certainly see the patient much sooner if necessary.  Disclaimer: This note was dictated with voice recognition software. Similar sounding words can inadvertently be transcribed and may not be corrected upon review.

## 2019-02-23 ENCOUNTER — Other Ambulatory Visit: Payer: Self-pay | Admitting: Internal Medicine

## 2019-02-23 ENCOUNTER — Other Ambulatory Visit: Payer: Self-pay | Admitting: Cardiology

## 2019-02-23 DIAGNOSIS — G6289 Other specified polyneuropathies: Secondary | ICD-10-CM

## 2019-02-27 ENCOUNTER — Other Ambulatory Visit: Payer: Self-pay | Admitting: Medical Oncology

## 2019-02-27 DIAGNOSIS — Z5112 Encounter for antineoplastic immunotherapy: Secondary | ICD-10-CM

## 2019-02-27 DIAGNOSIS — R5382 Chronic fatigue, unspecified: Secondary | ICD-10-CM

## 2019-03-01 NOTE — Progress Notes (Signed)
[place] OFFICE PROGRESS NOTE  Sheryle Hail, PA-C [address] [place] [address]  DIAGNOSIS: Stage IV (T2a,N3,M1c)non-small cell lung cancer, squamous cell carcinoma presented with right upper lobe lung mass in addition to right hilar and mediastinal adenopathy as well as metastatic disease in the medial right thigh diagnosed in November 2018.  PRIOR THERAPY: 1) Palliative radiotherapy to the right lower extremity mass completed April 09, 2017. 2) Systemic chemotherapy with carboplatin for AUC of 5, paclitaxel 175 mg/M2 and Keytruda 200 mg IV every 3 weeks.  First dose March 12, 2017, status post 4 cycles with partial response.  CURRENT THERAPY: Maintenance treatment with single agent Keytruda 200 mg IV every 3 weeks.  First cycle June 11, 2017.  Status post 34 cycles  INTERVAL HISTORY: Aaron Sellers 68 y.o. male returns to the clinic for a follow up visit accompanied by his wife. The patient is feeling well today without any concerning complaints. The patient continues to tolerate treatment with immunotherapy with [facility] well without any adverse side effects except the patient's most recent scan showed interval increase in size and number of diffuse bilateral peribronchovascular groundglass nodularity suspicious for atypical pneumonia/metastatic disease but also could be immunotherapy mediated inflammatory process. The patient was given the option of discontinuing treatment and starting on high dose steroids for suspicious pneumonitis vs continuing with the last two cycles of treatment. He wanted to proceed with his last two cycles of treatment and was given doxycycline empirically and prednisone. He has completed his doxycycline prescription. He still has a few days left of his prednisone. Today, he denies any fever, chills, night sweats, or weight loss. Denies any chest pain, shortness of breath, or hemoptysis. He has a mild baseline cough. Denies any  nausea, vomiting, or constipation. He had a few episodes of diarrhea with no more than 1 loose stool per day. He states these episodes are self limiting and do not require any anti-diarrheal medication. He denies any blood or mucus in his stool. Denies any headache or visual changes. Denies any rashes or skin changes. The patient is here today for evaluation prior to starting cycle # 35  MEDICAL HISTORY: Past Medical History:  Diagnosis Date  . Arthritis   . Atrial flutter ([place])   . BPH (benign prostatic hypertrophy) with urinary retention   . Cancer ([place])   . Chronic kidney disease    hx of kidney stones  2011  . COPD (chronic obstructive pulmonary disease) ([employer])    has been smoking since he was 22 ish--  . High cholesterol   . Hypertension    dx around 2011  . Squamous cell carcinoma of right lung ([facility])     ALLERGIES:  is allergic to ciprofloxacin; adhesive [tape]; and codeine.  MEDICATIONS:  Current Outpatient Medications  Medication Sig Dispense Refill  . acetaminophen (TYLENOL) 500 MG tablet Take 1,000 mg by mouth every 6 (six) hours as needed for moderate pain.    Marland Kitchen atorvastatin (LIPITOR) 10 MG tablet Take 10 mg by mouth every evening.     Marland Kitchen CARTIA XT 180 MG 24 hr capsule Take 1 capsule by mouth once daily 90 capsule 0  . cephALEXin (KEFLEX) 500 MG capsule Take 500 mg by mouth 2 (two) times daily.    . Cyanocobalamin (VITAMIN B-12 PO) Take 1 tablet by mouth daily.    Marland Kitchen doxycycline (VIBRA-TABS) 100 MG tablet Take 1 tablet (100 mg total) by mouth 2 (two) times daily. 28 tablet 0  .  famotidine (PEPCID) 10 MG tablet Take 10 mg by mouth as needed for heartburn or indigestion.    . furosemide (LASIX) 40 MG tablet Take 1 tablet (40 mg total) by mouth daily as needed. 90 tablet 3  . gabapentin (NEURONTIN) 300 MG capsule TAKE 1 CAPSULE BY MOUTH THREE TIMES DAILY 90 capsule 0  . loratadine (CLARITIN) 10 MG tablet Take 10 mg by mouth daily.    . predniSONE (DELTASONE) 10 MG tablet 1  tablet p.o. daily. 30 tablet 0  . sulfacetamide (BLEPH-10) 10 % ophthalmic solution Place 1 drop into both eyes.    . tamsulosin (FLOMAX) 0.4 MG CAPS capsule Take 0.4 mg by mouth daily.     No current facility-administered medications for this visit.    Facility-Administered Medications Ordered in Other Visits  Medication Dose Route Frequency Provider Last Rate Last Dose  . heparin lock flush 100 unit/mL  500 Units Intracatheter Once PRN Curt Bears, MD      . sodium chloride flush (NS) 0.9 % injection 10 mL  10 mL Intracatheter PRN Curt Bears, MD        SURGICAL HISTORY:  Past Surgical History:  Procedure Laterality Date  . HERNIA REPAIR     umbilical hernia  10/1189  . INCISION AND DRAINAGE ABSCESS     "down the middle" of his buttocks  2015  . TOTAL KNEE ARTHROPLASTY Left 10/29/2014   Procedure: TOTAL KNEE ARTHROPLASTY;  Surgeon: Dorna Leitz, MD;  Location: [facility];  Service: Orthopedics;  Laterality: Left;    REVIEW OF SYSTEMS:   Review of Systems  Constitutional: Negative for appetite change, chills, fatigue, fever and unexpected weight change.  HENT: Negative for mouth sores, nosebleeds, sore throat and trouble swallowing.   Eyes: Negative for eye problems and icterus.  Respiratory: Positive for mild baseline cough. Negative for hemoptysis, shortness of breath and wheezing.   Cardiovascular: Negative for chest pain and leg swelling.  Gastrointestinal: Positive for mild diarrhea (resolved). Negative for abdominal pain, constipation, nausea and vomiting.  Genitourinary: Negative for bladder incontinence, difficulty urinating, dysuria, frequency and hematuria.   Musculoskeletal: Negative for back pain, gait problem, neck pain and neck stiffness.  Skin: Negative for itching and rash.  Neurological: Negative for dizziness, extremity weakness, gait problem, headaches, light-headedness and seizures.  Hematological: Negative for adenopathy. Does not bruise/bleed easily.   Psychiatric/Behavioral: Negative for confusion, depression and sleep disturbance. The patient is not nervous/anxious.     PHYSICAL EXAMINATION:  There were no vitals taken for this visit.  ECOG PERFORMANCE STATUS: 1 - Symptomatic but completely ambulatory  Physical Exam  Constitutional: Oriented to person, place, and time and well-developed, well-nourished, and in no distress. HENT:  Head: Normocephalic and atraumatic.  Mouth/Throat: Oropharynx is clear and moist. No oropharyngeal exudate.  Eyes: Conjunctivae are normal. Right eye exhibits no discharge. Left eye exhibits no discharge. No scleral icterus.  Neck: Normal range of motion. Neck supple.  Cardiovascular: Normal rate, regular rhythm, normal heart sounds and intact distal pulses.   Pulmonary/Chest: Effort normal. Slightly decreased breath sounds in right lung. No respiratory distress. No wheezes. No rales.  Abdominal: Soft. Bowel sounds are normal. Exhibits no distension and no mass. There is no tenderness.  Musculoskeletal: Normal range of motion. Exhibits no edema.  Lymphadenopathy:    No cervical adenopathy.  Neurological: Alert and oriented to person, place, and time. Exhibits normal muscle tone. Gait normal. Coordination normal.  Skin: Skin is warm and dry. No rash noted. Not diaphoretic. No erythema. No  pallor.  Psychiatric: Mood, memory and judgment normal.  Vitals reviewed.  LABORATORY DATA: Lab Results  Component Value Date   WBC 6.5 02/10/2019   HGB 14.7 02/10/2019   HCT 44.2 02/10/2019   MCV 85.5 02/10/2019   PLT 185 02/10/2019      Chemistry      Component Value Date/Time   NA 140 02/10/2019 0911   NA 137 04/03/2017 1030   K 3.8 02/10/2019 0911   K 4.1 04/03/2017 1030   CL 105 02/10/2019 0911   CO2 25 02/10/2019 0911   CO2 27 04/03/2017 1030   BUN 11 02/10/2019 0911   BUN 15.5 04/03/2017 1030   CREATININE 0.94 02/10/2019 0911   CREATININE 0.8 04/03/2017 1030      Component Value Date/Time    CALCIUM 8.9 02/10/2019 0911   CALCIUM 9.4 04/03/2017 1030   ALKPHOS 104 02/10/2019 0911   ALKPHOS 97 04/03/2017 1030   AST 15 02/10/2019 0911   AST 14 04/03/2017 1030   ALT 15 02/10/2019 0911   ALT 21 04/03/2017 1030   BILITOT 0.8 02/10/2019 0911   BILITOT 0.67 04/03/2017 1030       RADIOGRAPHIC STUDIES:  Ct Chest W Contrast  Result Date: 02/06/2019 CLINICAL DATA:  Patient with history of lung cancer. Follow-up exam. EXAM: CT CHEST, ABDOMEN, AND PELVIS WITH CONTRAST TECHNIQUE: Multidetector CT imaging of the chest, abdomen and pelvis was performed following the standard protocol during bolus administration of intravenous contrast. CONTRAST:  175mL OMNIPAQUE IOHEXOL 300 MG/ML  SOLN COMPARISON:  CT CAP 11/17/2018 FINDINGS: CT CHEST FINDINGS Cardiovascular: Normal heart size. Trace fluid superior pericardial recess. Thoracic aortic and coronary arterial vascular calcifications. Mediastinum/Nodes: Unchanged 1.2 cm subcarinal lymph node (image 30; series 2). Interval decrease in size of 1.0 cm left axillary node (image 19; series 2), previously 1.3 cm. No right axillary lymphadenopathy. Lungs/Pleura: Small amount of dependent mucus within the proximal right mainstem bronchus. Unchanged spiculated nodule within the right upper lobe measuring 1.5 x 0.9 cm (image 44; series 6). Significant interval increase in bilateral scattered areas of organized appearing ground-glass. Ground-glass nodule within the lingula is increased in size measuring 2.3 x 2.2 cm (image 79; series 6), previously 1.6 x 1.4 cm. Interval development of centrally cavitary ground-glass nodules within the left upper lobe measuring 1.8 x 1.5 cm (image 39; series 6). No pleural effusion or pneumothorax. Musculoskeletal: Thoracic spine degenerative changes. No aggressive or acute appearing osseous lesions. CT ABDOMEN PELVIS FINDINGS Hepatobiliary: The liver is lobular in contour. No focal lesion identified. Gallbladder is unremarkable. No  intrahepatic or extrahepatic biliary ductal dilatation. Pancreas: Unremarkable Spleen: Unremarkable Adrenals/Urinary Tract: Stable 2.9 cm right adrenal nodule (image 61; series 2). Stable 1.9 cm right adrenal nodule (image 64; series 2). Kidneys enhance symmetrically with contrast. Unchanged small stone midpole right kidney. Right renal cyst. No hydronephrosis. Urinary bladder is unremarkable. Stomach/Bowel: Oral contrast material to the level of the rectum. Sigmoid colonic diverticulosis. No CT evidence for acute diverticulitis. Normal morphology of the stomach. No evidence for small bowel obstruction. No free fluid or free intraperitoneal air. Vascular/Lymphatic: Peripheral calcified atherosclerotic plaque involving the abdominal aorta. Infrarenal abdominal aorta measures 2.7 cm. No retroperitoneal lymphadenopathy. Similar-appearing 1.5 cm porta hepatic nodes. Reproductive: Heterogeneous prostate. Other: None. Musculoskeletal: Lumbar spine degenerative changes. No aggressive or acute appearing osseous lesions. IMPRESSION: 1. Interval increase in size and number of diffuse bilateral peribronchovascular ground-glass nodularity and consolidation which may be secondary to atypical/fungal pneumonia or metastatic disease. Additional consideration is immunotherapy related  pneumonitis. 2. Stable spiculated right upper lobe nodule. 3. Small right renal stone. Infrarenal abdominal aortic ectasia. Recommend continued attention on follow-up exams. Electronically Signed   By: Lovey Newcomer M.D.   On: 02/06/2019 15:44   Ct Abdomen Pelvis W Contrast  Result Date: 02/06/2019 CLINICAL DATA:  Patient with history of lung cancer. Follow-up exam. EXAM: CT CHEST, ABDOMEN, AND PELVIS WITH CONTRAST TECHNIQUE: Multidetector CT imaging of the chest, abdomen and pelvis was performed following the standard protocol during bolus administration of intravenous contrast. CONTRAST:  134mL OMNIPAQUE IOHEXOL 300 MG/ML  SOLN COMPARISON:  CT CAP  11/17/2018 FINDINGS: CT CHEST FINDINGS Cardiovascular: Normal heart size. Trace fluid superior pericardial recess. Thoracic aortic and coronary arterial vascular calcifications. Mediastinum/Nodes: Unchanged 1.2 cm subcarinal lymph node (image 30; series 2). Interval decrease in size of 1.0 cm left axillary node (image 19; series 2), previously 1.3 cm. No right axillary lymphadenopathy. Lungs/Pleura: Small amount of dependent mucus within the proximal right mainstem bronchus. Unchanged spiculated nodule within the right upper lobe measuring 1.5 x 0.9 cm (image 44; series 6). Significant interval increase in bilateral scattered areas of organized appearing ground-glass. Ground-glass nodule within the lingula is increased in size measuring 2.3 x 2.2 cm (image 79; series 6), previously 1.6 x 1.4 cm. Interval development of centrally cavitary ground-glass nodules within the left upper lobe measuring 1.8 x 1.5 cm (image 39; series 6). No pleural effusion or pneumothorax. Musculoskeletal: Thoracic spine degenerative changes. No aggressive or acute appearing osseous lesions. CT ABDOMEN PELVIS FINDINGS Hepatobiliary: The liver is lobular in contour. No focal lesion identified. Gallbladder is unremarkable. No intrahepatic or extrahepatic biliary ductal dilatation. Pancreas: Unremarkable Spleen: Unremarkable Adrenals/Urinary Tract: Stable 2.9 cm right adrenal nodule (image 61; series 2). Stable 1.9 cm right adrenal nodule (image 64; series 2). Kidneys enhance symmetrically with contrast. Unchanged small stone midpole right kidney. Right renal cyst. No hydronephrosis. Urinary bladder is unremarkable. Stomach/Bowel: Oral contrast material to the level of the rectum. Sigmoid colonic diverticulosis. No CT evidence for acute diverticulitis. Normal morphology of the stomach. No evidence for small bowel obstruction. No free fluid or free intraperitoneal air. Vascular/Lymphatic: Peripheral calcified atherosclerotic plaque involving  the abdominal aorta. Infrarenal abdominal aorta measures 2.7 cm. No retroperitoneal lymphadenopathy. Similar-appearing 1.5 cm porta hepatic nodes. Reproductive: Heterogeneous prostate. Other: None. Musculoskeletal: Lumbar spine degenerative changes. No aggressive or acute appearing osseous lesions. IMPRESSION: 1. Interval increase in size and number of diffuse bilateral peribronchovascular ground-glass nodularity and consolidation which may be secondary to atypical/fungal pneumonia or metastatic disease. Additional consideration is immunotherapy related pneumonitis. 2. Stable spiculated right upper lobe nodule. 3. Small right renal stone. Infrarenal abdominal aortic ectasia. Recommend continued attention on follow-up exams. Electronically Signed   By: Lovey Newcomer M.D.   On: 02/06/2019 15:44     ASSESSMENT/PLAN:  This is a very pleasant 68 year old Caucasian male with a stage IV non-small cell lung cancer, squamous cell carcinoma.  The patient is currently undergoing systemic chemotherapy with carboplatin, paclitaxel and Keytruda status post 4 cycles with partial response. He is currently undergoing maintenance treatment with single agent Keytruda status post 34 cycles.  His recent CT scan on 02/06/2019 showed no concerning findings for disease progression but there was interval increase in size and number of diffuse bilateral peribronchovascular groundglass nodularity suspicious for atypical pneumonia/metastatic disease but also could be immunotherapy mediated inflammatory process. He was started on doxycycline and prednisone. He completed the course of antibiotics. He is finishing up the prednisone. He denies any  abnormal respiratory symptoms at this time. The patient also wished to proceed with the last two cycles of his immunotherapy. He is here for his last cycle today.   The patient was seen with Dr. Julien Nordmann. Labs were reviewed. Dr. Julien Nordmann recommends that the patient proceed with the last cycle of  immunotherapy with [facility] today as scheduled, cycle #35.   I will arrange for the patient to have a restaging CT scan of the chest, abdomen, and pelvis in about 4 weeks.   We will see him back for a follow up visit in 4 weeks for evaluation and to review his scan results.   He will continue with his prednisone until his prescription is complete.   The patient was advised to call immediately if he has any concerning symptoms in the interval. The patient voices understanding of current disease status and treatment options and is in agreement with the current care plan. All questions were answered. The patient knows to call the clinic with any problems, questions or concerns. We can certainly see the patient much sooner if necessary    No orders of the defined types were placed in this encounter.    Aaron Sellers L Alize Acy, PA-C 03/01/19  ADDENDUM: Hematology/Oncology Attending: I had a face-to-face encounter with the patient today.  I recommended his care plan.  This is a very pleasant 68 years old white male with metastatic non-small cell lung cancer, squamous cell carcinoma status post induction chemotherapy with carboplatin, paclitaxel and Keytruda and he is currently on maintenance treatment with Keytruda status post 34 cycles and has been tolerating his treatment fairly well.  The patient had inflammatory process from his previous imaging studies and he was treated with a course of doxycycline as well as prednisone.  He completed his treatment with doxycycline but will continue on a daily dose of prednisone 10 mg p.o. daily because of concern about immunotherapy mediated pneumonitis. He is currently asymptomatic and feeling good.  I recommended for him to proceed with the last dose of Keytruda today as planned. We will see him back for follow-up visit in 4 weeks with repeat CT scan of the chest, abdomen and pelvis for restaging of his disease. The patient was advised to call  immediately if he has any concerning symptoms in the interval.  Disclaimer: This note was dictated with voice recognition software. Similar sounding words can inadvertently be transcribed and may be missed upon review. Eilleen Kempf, MD 03/02/19

## 2019-03-02 ENCOUNTER — Encounter: Payer: Self-pay | Admitting: Physician Assistant

## 2019-03-02 ENCOUNTER — Telehealth: Payer: Self-pay | Admitting: Internal Medicine

## 2019-03-02 ENCOUNTER — Inpatient Hospital Stay (HOSPITAL_BASED_OUTPATIENT_CLINIC_OR_DEPARTMENT_OTHER): Payer: Medicare Other | Admitting: Physician Assistant

## 2019-03-02 ENCOUNTER — Inpatient Hospital Stay: Payer: Medicare Other

## 2019-03-02 ENCOUNTER — Other Ambulatory Visit: Payer: Self-pay

## 2019-03-02 VITALS — BP 126/78 | HR 57 | Temp 98.7°F | Resp 16 | Ht >= 80 in | Wt 279.5 lb

## 2019-03-02 DIAGNOSIS — R5382 Chronic fatigue, unspecified: Secondary | ICD-10-CM

## 2019-03-02 DIAGNOSIS — Z5112 Encounter for antineoplastic immunotherapy: Secondary | ICD-10-CM

## 2019-03-02 DIAGNOSIS — C3491 Malignant neoplasm of unspecified part of right bronchus or lung: Secondary | ICD-10-CM | POA: Diagnosis not present

## 2019-03-02 LAB — CMP (CANCER CENTER ONLY)
ALT: 21 U/L (ref 0–44)
AST: 17 U/L (ref 15–41)
Albumin: 3.8 g/dL (ref 3.5–5.0)
Alkaline Phosphatase: 93 U/L (ref 38–126)
Anion gap: 9 (ref 5–15)
BUN: 14 mg/dL (ref 8–23)
CO2: 26 mmol/L (ref 22–32)
Calcium: 9.4 mg/dL (ref 8.9–10.3)
Chloride: 105 mmol/L (ref 98–111)
Creatinine: 0.86 mg/dL (ref 0.61–1.24)
GFR, Est AFR Am: >60 mL/min (ref >60)
GFR, Estimated: >60 mL/min (ref >60)
Glucose, Bld: 93 mg/dL (ref 70–99)
Potassium: 3.9 mmol/L (ref 3.5–5.1)
Sodium: 140 mmol/L (ref 135–145)
Total Bilirubin: 0.8 mg/dL (ref 0.3–1.2)
Total Protein: 6.9 g/dL (ref 6.5–8.1)

## 2019-03-02 LAB — CBC WITH DIFFERENTIAL (CANCER CENTER ONLY)
Abs Immature Granulocytes: 0.02 10*3/uL (ref 0.00–0.07)
Basophils Absolute: 0.1 10*3/uL (ref 0.0–0.1)
Basophils Relative: 1 %
Eosinophils Absolute: 0.2 10*3/uL (ref 0.0–0.5)
Eosinophils Relative: 2 %
HCT: 47 % (ref 39.0–52.0)
Hemoglobin: 15.5 g/dL (ref 13.0–17.0)
Immature Granulocytes: 0 %
Lymphocytes Relative: 24 %
Lymphs Abs: 2.5 10*3/uL (ref 0.7–4.0)
MCH: 28.1 pg (ref 26.0–34.0)
MCHC: 33 g/dL (ref 30.0–36.0)
MCV: 85.1 fL (ref 80.0–100.0)
Monocytes Absolute: 0.9 10*3/uL (ref 0.1–1.0)
Monocytes Relative: 9 %
Neutro Abs: 6.4 10*3/uL (ref 1.7–7.7)
Neutrophils Relative %: 64 %
Platelet Count: 169 10*3/uL (ref 150–400)
RBC: 5.52 MIL/uL (ref 4.22–5.81)
RDW: 13.5 % (ref 11.5–15.5)
WBC Count: 10.1 10*3/uL (ref 4.0–10.5)
nRBC: 0 % (ref 0.0–0.2)

## 2019-03-02 LAB — TSH: TSH: 3.688 u[IU]/mL (ref 0.320–4.118)

## 2019-03-02 MED ORDER — SODIUM CHLORIDE 0.9 % IV SOLN
200.0000 mg | Freq: Once | INTRAVENOUS | Status: AC
  Administered 2019-03-02: 200 mg via INTRAVENOUS
  Filled 2019-03-02: qty 8

## 2019-03-02 MED ORDER — SODIUM CHLORIDE 0.9 % IV SOLN
Freq: Once | INTRAVENOUS | Status: AC
  Administered 2019-03-02: 12:00:00 via INTRAVENOUS
  Filled 2019-03-02: qty 250

## 2019-03-02 NOTE — Patient Instructions (Signed)
[facility] Discharge Instructions for Patients Receiving Chemotherapy  Today you received the following chemotherapy agents Pembrolizumab (KEYTRUDA).  To help prevent nausea and vomiting after your treatment, we encourage you to take your nausea medication as prescribed.   If you develop nausea and vomiting that is not controlled by your nausea medication, call the clinic.   BELOW ARE SYMPTOMS THAT SHOULD BE REPORTED IMMEDIATELY:  *FEVER GREATER THAN 100.5 F  *CHILLS WITH OR WITHOUT FEVER  NAUSEA AND VOMITING THAT IS NOT CONTROLLED WITH YOUR NAUSEA MEDICATION  *UNUSUAL SHORTNESS OF BREATH  *UNUSUAL BRUISING OR BLEEDING  TENDERNESS IN MOUTH AND THROAT WITH OR WITHOUT PRESENCE OF ULCERS  *URINARY PROBLEMS  *BOWEL PROBLEMS  UNUSUAL RASH Items with * indicate a potential emergency and should be followed up as soon as possible.  Feel free to call the clinic should you have any questions or concerns. The clinic phone number is [phone].  Please show the CHEMO ALERT CARD at check-in to the Emergency Department and triage nurse.  Coronavirus (COVID-19) Are you at risk?  Are you at risk for the Coronavirus (COVID-19)?  To be considered HIGH RISK for Coronavirus (COVID-19), you have to meet the following criteria:  . Traveled to [place], [place], [place], [place] or [place]; or in the [place] to [place], [place], [place], or [place]; and have fever, cough, and shortness of breath within the last 2 weeks of travel OR . Been in close contact with a person diagnosed with COVID-19 within the last 2 weeks and have fever, cough, and shortness of breath . IF YOU DO NOT MEET THESE CRITERIA, YOU ARE CONSIDERED LOW RISK FOR COVID-19.  What to do if you are HIGH RISK for COVID-19?  . If you are having a medical emergency, call 911. . Seek medical care right away. Before you go to a doctor's office, urgent care or emergency department, call ahead and tell  them about your recent travel, contact with someone diagnosed with COVID-19, and your symptoms. You should receive instructions from your physician's office regarding next steps of care.  . When you arrive at healthcare provider, tell the healthcare staff immediately you have returned from visiting [place], [place], [place], [place] or [place]; or traveled in the [place] to [place], [place], [place], or [place]; in the last two weeks or you have been in close contact with a person diagnosed with COVID-19 in the last 2 weeks.   . Tell the health care staff about your symptoms: fever, cough and shortness of breath. . After you have been seen by a medical provider, you will be either: o Tested for (COVID-19) and discharged home on quarantine except to seek medical care if symptoms worsen, and asked to  - Stay home and avoid contact with others until you get your results (4-5 days)  - Avoid travel on public transportation if possible (such as bus, train, or airplane) or o Sent to the Emergency Department by EMS for evaluation, COVID-19 testing, and possible admission depending on your condition and test results.  What to do if you are LOW RISK for COVID-19?  Reduce your risk of any infection by using the same precautions used for avoiding the common cold or flu:  . Wash your hands often with soap and warm water for at least 20 seconds.  If soap and water are not readily available, use an alcohol-based hand sanitizer with at least 60% alcohol.  . If coughing or   sneezing, cover your mouth and nose by coughing or sneezing into the elbow areas of your shirt or coat, into a tissue or into your sleeve (not your hands). . Avoid shaking hands with others and consider head nods or verbal greetings only. . Avoid touching your eyes, nose, or mouth with unwashed hands.  . Avoid close contact with people who are sick. . Avoid places or events with large numbers of people in one location, like concerts or  sporting events. . Carefully consider travel plans you have or are making. . If you are planning any travel outside or inside the [place], visit the CDC's Travelers' Health webpage for the latest health notices. . If you have some symptoms but not all symptoms, continue to monitor at home and seek medical attention if your symptoms worsen. . If you are having a medical emergency, call 911.   ADDITIONAL HEALTHCARE OPTIONS FOR PATIENTS  [facility] / e-Visit: [email or web address]         [facility]: [phone]  [facility]: [phone]                   [facility]: [phone]    

## 2019-03-02 NOTE — Telephone Encounter (Signed)
Gave avs and calendar ° °

## 2019-03-19 ENCOUNTER — Ambulatory Visit (INDEPENDENT_AMBULATORY_CARE_PROVIDER_SITE_OTHER): Payer: Medicare Other | Admitting: Family Medicine

## 2019-03-19 ENCOUNTER — Encounter: Payer: Self-pay | Admitting: Family Medicine

## 2019-03-19 ENCOUNTER — Other Ambulatory Visit: Payer: Self-pay

## 2019-03-19 VITALS — BP 123/74 | HR 65 | Ht 72.0 in | Wt 284.8 lb

## 2019-03-19 DIAGNOSIS — R600 Localized edema: Secondary | ICD-10-CM

## 2019-03-19 DIAGNOSIS — I4892 Unspecified atrial flutter: Secondary | ICD-10-CM | POA: Diagnosis not present

## 2019-03-19 DIAGNOSIS — E7800 Pure hypercholesterolemia, unspecified: Secondary | ICD-10-CM

## 2019-03-19 DIAGNOSIS — I10 Essential (primary) hypertension: Secondary | ICD-10-CM

## 2019-03-19 NOTE — Patient Instructions (Signed)

## 2019-03-19 NOTE — Progress Notes (Signed)
Cardiology Office Note  Date: 03/19/2019   ID: [name], DOB 03/10/1951, MRN [id number]  PCP:  Sheryle Hail, PA-C  Cardiologist:  Carlyle Dolly, MD Electrophysiologist:  None   Chief Complaint  Patient presents with  . Follow-up    Paroxysmal atrial flutter, palpitations, hypertension, hyperlipidemia    History of Present Illness: Aaron Sellers is a 68 y.o. male last encounter via telemedicine with Dr. Harl Bowie September 24, 2018.  History of paroxysmal atrial flutter, occasional palpitation, stage IV lung cancer followed by oncology.  History of lower extremity edema, chronic kidney disease, COPD, hypertension, hyperlipidemia.  Patient denies any recent episodes of palpitations, fluttering, or heart racing.  States he has been working a lot in his garden without any progressive anginal or exertional symptoms.  Patient has been undergoing Keytruda treatments approximately 35 sessions and is unable to have any more of these treatments.  He is due to see Dr. Earlie Server, oncology, in the near future to discuss possible other options for treatment for his stage IV lung CA.  States he has been having some mild right hip pain which he attributes to arthritis.  He has some mild right lower extremity edema.  He does some lower venous stasis changes noted in the right leg.  Denies any exposure to Covid or any Covid-like symptoms.  He wears a mask when going out in public.  Past Medical History:  Diagnosis Date  . Arthritis   . Atrial flutter ([place])   . BPH (benign prostatic hypertrophy) with urinary retention   . Cancer ([place])   . Chronic kidney disease    hx of kidney stones  2011  . COPD (chronic obstructive pulmonary disease) ([place])    has been smoking since he was 22 ish--  . High cholesterol   . Hypertension    dx around 2011  . Squamous cell carcinoma of right lung [facility])     Past Surgical History:  Procedure Laterality Date  . HERNIA REPAIR     umbilical hernia   10/1243  . INCISION AND DRAINAGE ABSCESS     "down the middle" of his buttocks  2015  . TOTAL KNEE ARTHROPLASTY Left 10/29/2014   Procedure: TOTAL KNEE ARTHROPLASTY;  Surgeon: Dorna Leitz, MD;  Location: [place];  Service: Orthopedics;  Laterality: Left;    Current Outpatient Medications  Medication Sig Dispense Refill  . acetaminophen (TYLENOL) 500 MG tablet Take 1,000 mg by mouth every 6 (six) hours as needed for moderate pain.    Marland Kitchen atorvastatin (LIPITOR) 10 MG tablet Take 10 mg by mouth every evening.     Marland Kitchen CARTIA XT 180 MG 24 hr capsule Take 1 capsule by mouth once daily 90 capsule 0  . Cyanocobalamin (VITAMIN B-12 PO) Take 1 tablet by mouth daily.    . famotidine (PEPCID) 10 MG tablet Take 10 mg by mouth as needed for heartburn or indigestion.    . furosemide (LASIX) 40 MG tablet Take 1 tablet (40 mg total) by mouth daily as needed. 90 tablet 3  . gabapentin (NEURONTIN) 300 MG capsule TAKE 1 CAPSULE BY MOUTH THREE TIMES DAILY 90 capsule 0  . loratadine (CLARITIN) 10 MG tablet Take 10 mg by mouth daily.    . predniSONE (DELTASONE) 10 MG tablet 1 tablet p.o. daily. 30 tablet 0  . tamsulosin (FLOMAX) 0.4 MG CAPS capsule Take 0.4 mg by mouth daily.     No current facility-administered medications for this visit.   Facility-Administered Medications Ordered in Other  Visits  Medication Dose Route Frequency Provider Last Rate Last Admin  . heparin lock flush 100 unit/mL  500 Units Intracatheter Once PRN Curt Bears, MD      . sodium chloride flush (NS) 0.9 % injection 10 mL  10 mL Intracatheter PRN Curt Bears, MD       Allergies:  Ciprofloxacin, Adhesive [tape], and Codeine   Social History: The patient  reports that he quit smoking about 2 years ago. His smoking use included cigarettes. He has a 40.00 pack-year smoking history. He has quit using smokeless tobacco.  His smokeless tobacco use included snuff. He reports that he does not drink alcohol or use drugs.   Family History:  The patient's family history includes CVA in his father; Hypertension in his brother, mother, and sister; Lung cancer in his brother and sister.   ROS:  Please see the history of present illness. Otherwise, complete review of systems is positive for none.  All other systems are reviewed and negative.   Physical Exam: VS:  There were no vitals taken for this visit., BMI There is no height or weight on file to calculate BMI.  Wt Readings from Last 3 Encounters:  03/02/19 279 lb 8 oz (126.8 kg)  02/10/19 281 lb 14.4 oz (127.9 kg)  01/20/19 280 lb (127 kg)    General: Patient appears comfortable at rest. Neck: Supple, no elevated JVP or carotid bruits, no thyromegaly. Lungs: Clear to auscultation, nonlabored breathing at rest. Cardiac: Regular rate and rhythm, no S3 or significant systolic murmur, no pericardial rub. Abdomen: Soft, nontender, no hepatomegaly, bowel sounds present, no guarding or rebound. Extremities: No pitting edema, distal pulses 2+. Skin: Warm and dry. Musculoskeletal: No kyphosis. Neuropsychiatric: Alert and oriented x3, affect grossly appropriate.  ECG:  An ECG dated 03/19/2019 was personally reviewed today and demonstrated:  Sinus rhythm rate of 60  Recent Labwork: 03/02/2019: ALT 21; AST 17; BUN 14; Creatinine 0.86; Hemoglobin 15.5; Platelet Count 169; Potassium 3.9; Sodium 140; TSH 3.688     Component Value Date/Time   CHOL 117 01/29/2017 0428   TRIG 55 01/29/2017 0428   HDL 30 (L) 01/29/2017 0428   CHOLHDL 3.9 01/29/2017 0428   VLDL 11 01/29/2017 0428   LDLCALC 76 01/29/2017 0428    Other Studies Reviewed Today: Low venous Doppler study to rule out DVT secondary to right leg swelling April 04, 2018 Summary: Right: There is no evidence of deep vein thrombosis in the lower extremity. No cystic structure found in the popliteal fossa. Left: There is no evidence of deep vein thrombosis in the lower extremity. No cystic structure found in the popliteal  fossa.   Echocardiogram on June 01, 2017 Study Conclusions  - Left ventricle: The cavity size was mildly dilated. Wall   thickness was increased in a pattern of mild LVH. Systolic   function was normal. The estimated ejection fraction was in the   range of 55% to 60%. Wall motion was normal; there were no   regional wall motion abnormalities. The study is not technically   sufficient to allow evaluation of LV diastolic function. - Mitral valve: There was mild regurgitation. - Right ventricle: The cavity size was mildly dilated. - Right atrium: The atrium was mildly dilated.  Impressions: - Normal LV systolic function; mild LVH and LVE; mild MR; mild RAE   and RVE; mild TR.  CT chest abdomen and pelvis results performed on February 06, 2019 for stage IV lung cancer  IMPRESSION: 1. Interval increase  in size and number of diffuse bilateral peribronchovascular ground-glass nodularity and consolidation which may be secondary to atypical/fungal pneumonia or metastatic disease. Additional consideration is immunotherapy related pneumonitis. 2. Stable spiculated right upper lobe nodule. 3. Small right renal stone. Infrarenal abdominal aortic ectasia. Recommend continued attention on follow-up exams.  Assessment and Plan:  1. Atrial flutter, unspecified type (HCC)   2. Leg edema   3. Essential hypertension   4. High cholesterol    1. Atrial flutter, unspecified type [facility]) Patient is currently in sinus rhythm by EKG performed today.  EKG shows normal sinus rhythm with a rate of 60.  Continue cardiology XT 180 mg daily.  2. Leg edema Has some mild right lower extremity edema with some venous stasis changes noted.  Continue Lasix 40 mg daily as needed.  3. Essential hypertension Blood pressure within normal limits today at 123/74.  4. High cholesterol Taking atorvastatin 10 mg p.o. daily.  Last recorded LDL was 76 in 2018.  Patient has not had a recent lipid profile.  Patient  will obtain lipid profile with either his PCP or oncology provider per his wife.  Medication Adjustments/Labs and Tests Ordered: Current medicines are reviewed at length with the patient today.  Concerns regarding medicines are outlined above.    There are no Patient Instructions on file for this visit.       Signed, Levell July, NP 03/19/2019 3:46 PM    [facility] [place], [place],  [address] Phone: [phone]; Fax: [phone]

## 2019-03-19 NOTE — Progress Notes (Signed)
Erroneous

## 2019-03-30 ENCOUNTER — Other Ambulatory Visit: Payer: Self-pay

## 2019-03-30 ENCOUNTER — Inpatient Hospital Stay: Payer: Medicare Other | Attending: Oncology

## 2019-03-30 ENCOUNTER — Ambulatory Visit (HOSPITAL_COMMUNITY)
Admission: RE | Admit: 2019-03-30 | Discharge: 2019-03-30 | Disposition: A | Discharge status: Home / Self Care | Payer: Medicare Other | Source: Ambulatory Visit | Attending: Physician Assistant | Admitting: Physician Assistant

## 2019-03-30 DIAGNOSIS — C3491 Malignant neoplasm of unspecified part of right bronchus or lung: Secondary | ICD-10-CM | POA: Diagnosis present

## 2019-03-30 DIAGNOSIS — M1990 Unspecified osteoarthritis, unspecified site: Secondary | ICD-10-CM | POA: Insufficient documentation

## 2019-03-30 DIAGNOSIS — Z79899 Other long term (current) drug therapy: Secondary | ICD-10-CM | POA: Diagnosis not present

## 2019-03-30 DIAGNOSIS — N189 Chronic kidney disease, unspecified: Secondary | ICD-10-CM | POA: Diagnosis not present

## 2019-03-30 DIAGNOSIS — C3411 Malignant neoplasm of upper lobe, right bronchus or lung: Secondary | ICD-10-CM | POA: Diagnosis present

## 2019-03-30 DIAGNOSIS — J449 Chronic obstructive pulmonary disease, unspecified: Secondary | ICD-10-CM | POA: Diagnosis not present

## 2019-03-30 DIAGNOSIS — I129 Hypertensive chronic kidney disease with stage 1 through stage 4 chronic kidney disease, or unspecified chronic kidney disease: Secondary | ICD-10-CM | POA: Insufficient documentation

## 2019-03-30 DIAGNOSIS — E7800 Pure hypercholesterolemia, unspecified: Secondary | ICD-10-CM | POA: Diagnosis not present

## 2019-03-30 LAB — CBC WITH DIFFERENTIAL (CANCER CENTER ONLY)
Abs Immature Granulocytes: 0.02 10*3/uL (ref 0.00–0.07)
Basophils Absolute: 0.1 10*3/uL (ref 0.0–0.1)
Basophils Relative: 1 %
Eosinophils Absolute: 0.2 10*3/uL (ref 0.0–0.5)
Eosinophils Relative: 3 %
HCT: 45.3 % (ref 39.0–52.0)
Hemoglobin: 15.4 g/dL (ref 13.0–17.0)
Immature Granulocytes: 0 %
Lymphocytes Relative: 22 %
Lymphs Abs: 1.7 10*3/uL (ref 0.7–4.0)
MCH: 28.7 pg (ref 26.0–34.0)
MCHC: 34 g/dL (ref 30.0–36.0)
MCV: 84.5 fL (ref 80.0–100.0)
Monocytes Absolute: 0.7 10*3/uL (ref 0.1–1.0)
Monocytes Relative: 10 %
Neutro Abs: 4.9 10*3/uL (ref 1.7–7.7)
Neutrophils Relative %: 64 %
Platelet Count: 207 10*3/uL (ref 150–400)
RBC: 5.36 MIL/uL (ref 4.22–5.81)
RDW: 13.2 % (ref 11.5–15.5)
WBC Count: 7.6 10*3/uL (ref 4.0–10.5)
nRBC: 0 % (ref 0.0–0.2)

## 2019-03-30 LAB — CMP (CANCER CENTER ONLY)
ALT: 15 U/L (ref 0–44)
AST: 16 U/L (ref 15–41)
Albumin: 3.6 g/dL (ref 3.5–5.0)
Alkaline Phosphatase: 94 U/L (ref 38–126)
Anion gap: 9 (ref 5–15)
BUN: 12 mg/dL (ref 8–23)
CO2: 26 mmol/L (ref 22–32)
Calcium: 8.9 mg/dL (ref 8.9–10.3)
Chloride: 102 mmol/L (ref 98–111)
Creatinine: 0.83 mg/dL (ref 0.61–1.24)
GFR, Est AFR Am: >60 mL/min (ref >60)
GFR, Estimated: >60 mL/min (ref >60)
Glucose, Bld: 99 mg/dL (ref 70–99)
Potassium: 3.9 mmol/L (ref 3.5–5.1)
Sodium: 137 mmol/L (ref 135–145)
Total Bilirubin: 0.9 mg/dL (ref 0.3–1.2)
Total Protein: 6.9 g/dL (ref 6.5–8.1)

## 2019-03-30 MED ORDER — IOHEXOL 300 MG/ML  SOLN
100.0000 mL | Freq: Once | INTRAMUSCULAR | Status: AC | PRN
  Administered 2019-03-30: 11:00:00 100 mL via INTRAVENOUS

## 2019-03-30 MED ORDER — SODIUM CHLORIDE (PF) 0.9 % IJ SOLN
INTRAMUSCULAR | Status: AC
  Filled 2019-03-30: qty 50

## 2019-03-31 NOTE — Progress Notes (Signed)
[facility] OFFICE PROGRESS NOTE  Sheryle Hail, PA-C [address] [place] [address]  DIAGNOSIS: Stage IV (T2a,N3,M1c)non-small cell lung cancer, squamous cell carcinoma presented with right upper lobe lung mass in addition to right hilar and mediastinal adenopathy as well as metastatic disease in the medial right thigh diagnosed in November 2018.  PRIOR THERAPY: 1) Palliative radiotherapy to the right lower extremity mass completed April 09, 2017. 2) Systemic chemotherapy with carboplatin for AUC of 5, paclitaxel 175 mg/M2 and Keytruda 200 mg IV every 3 weeks. First dose March 12, 2017, status post 4 cycles with partial response.  CURRENT THERAPY: Maintenance treatment with single agent Keytruda 200 mg IV every 3 weeks. First cycle June 11, 2017. Status post 35cycles  INTERVAL HISTORY: Aaron Sellers 68 y.o. male returns to the clinic for a follow up visit accompanied by his wife. The patient is feeling well today. The patient completed his last cycle of immunotherapy with Keytruda last month. He tolerated treatment fairly well; however, the patient's scan on 11/6 showed interval increase in size and number of diffuse bilateral peribronchovascular groundglass nodularity suspicious for atypical pneumonia/metastatic disease but also could be immunotherapy mediated inflammatory process.  He was started on doxycycline and prednisone. He completed the course of antibiotics. He completed his prednisone taper. He denies any abnormal respiratory symptoms at this time. He denies any significant shortness of breath or cough. He states that if he ever feels like he is getting a scratchy throat or sneezing, he takes Claritin or uses cough drops. He is feeling well at this time. He denies chest pain or hemoptysis.   He denies any fever, chills, night sweats, or weight loss. He denies any nausea, vomiting, diarrhea, or constipation. He denies any headaches or visual  changes. He denies any rashes or skin changes. He recently had a restaging CT scan. He is here for evaluation and to review his scan results.   MEDICAL HISTORY: Past Medical History:  Diagnosis Date  . Arthritis   . Atrial flutter ([facility])   . BPH (benign prostatic hypertrophy) with urinary retention   . Cancer ([facility])   . Chronic kidney disease    hx of kidney stones  2011  . COPD (chronic obstructive pulmonary disease) ([facility])    has been smoking since he was 22 ish--  . High cholesterol   . Hypertension    dx around 2011  . Squamous cell carcinoma of right lung ([facility])     ALLERGIES:  is allergic to ciprofloxacin; adhesive [tape]; and codeine.  MEDICATIONS:  Current Outpatient Medications  Medication Sig Dispense Refill  . acetaminophen (TYLENOL) 500 MG tablet Take 1,000 mg by mouth every 6 (six) hours as needed for moderate pain.    Marland Kitchen atorvastatin (LIPITOR) 10 MG tablet Take 10 mg by mouth every evening.     Marland Kitchen CARTIA XT 180 MG 24 hr capsule Take 1 capsule by mouth once daily 90 capsule 0  . Cyanocobalamin (VITAMIN B-12 PO) Take 1 tablet by mouth daily.    . famotidine (PEPCID) 10 MG tablet Take 10 mg by mouth as needed for heartburn or indigestion.    . furosemide (LASIX) 40 MG tablet Take 1 tablet (40 mg total) by mouth daily as needed. 90 tablet 3  . gabapentin (NEURONTIN) 300 MG capsule TAKE 1 CAPSULE BY MOUTH THREE TIMES DAILY 90 capsule 0  . loratadine (CLARITIN) 10 MG tablet Take 10 mg by mouth daily.    . tamsulosin (FLOMAX) 0.4 MG  CAPS capsule Take 0.4 mg by mouth daily.     No current facility-administered medications for this visit.   Facility-Administered Medications Ordered in Other Visits  Medication Dose Route Frequency Provider Last Rate Last Admin  . heparin lock flush 100 unit/mL  500 Units Intracatheter Once PRN Curt Bears, MD      . sodium chloride flush (NS) 0.9 % injection 10 mL  10 mL Intracatheter PRN Curt Bears, MD        SURGICAL HISTORY:   Past Surgical History:  Procedure Laterality Date  . HERNIA REPAIR     umbilical hernia  08/6431  . INCISION AND DRAINAGE ABSCESS     "down the middle" of his buttocks  2015  . TOTAL KNEE ARTHROPLASTY Left 10/29/2014   Procedure: TOTAL KNEE ARTHROPLASTY;  Surgeon: Dorna Leitz, MD;  Location: [facility];  Service: Orthopedics;  Laterality: Left;    REVIEW OF SYSTEMS:   Review of Systems  Constitutional: Negative for appetite change, chills, fatigue, fever and unexpected weight change.  HENT:   Negative for mouth sores, nosebleeds, sore throat and trouble swallowing.   Eyes: Negative for eye problems and icterus.  Respiratory: Negative for cough, hemoptysis, shortness of breath and wheezing.  Cardiovascular: Negative for chest pain and leg swelling.  Gastrointestinal: Negative for abdominal pain, constipation, diarrhea, nausea and vomiting.  Genitourinary: Negative for bladder incontinence, difficulty urinating, dysuria, frequency and hematuria.   Musculoskeletal: Negative for back pain, gait problem, neck pain and neck stiffness.  Skin: Negative for itching and rash.  Neurological: Negative for dizziness, extremity weakness, gait problem, headaches, light-headedness and seizures.  Hematological: Negative for adenopathy. Does not bruise/bleed easily.  Psychiatric/Behavioral: Negative for confusion, depression and sleep disturbance. The patient is not nervous/anxious.     PHYSICAL EXAMINATION:  There were no vitals taken for this visit.  ECOG PERFORMANCE STATUS: 1 - Symptomatic but completely ambulatory  Physical Exam  Constitutional: Oriented to person, place, and time and well-developed, well-nourished, and in no distress.  HENT:  Head: Normocephalic and atraumatic.  Mouth/Throat: Oropharynx is clear and moist. No oropharyngeal exudate.  Eyes: Conjunctivae are normal. Right eye exhibits no discharge. Left eye exhibits no discharge. No scleral icterus.  Neck: Normal range of motion.  Neck supple.  Cardiovascular: Normal rate, regular rhythm, normal heart sounds and intact distal pulses.   Pulmonary/Chest: Effort normal and breath sounds normal. No respiratory distress. No wheezes. No rales.  Abdominal: Soft. Bowel sounds are normal. Exhibits no distension and no mass. There is no tenderness.  Musculoskeletal: Normal range of motion. Exhibits no edema.  Lymphadenopathy:    No cervical adenopathy.  Neurological: Alert and oriented to person, place, and time. Exhibits normal muscle tone. Gait normal. Coordination normal.  Skin: Skin is warm and dry. No rash noted. Not diaphoretic. No erythema. No pallor.  Psychiatric: Mood, memory and judgment normal.  Vitals reviewed.  LABORATORY DATA: Lab Results  Component Value Date   WBC 7.6 03/30/2019   HGB 15.4 03/30/2019   HCT 45.3 03/30/2019   MCV 84.5 03/30/2019   PLT 207 03/30/2019      Chemistry      Component Value Date/Time   NA 137 03/30/2019 1108   NA 137 04/03/2017 1030   K 3.9 03/30/2019 1108   K 4.1 04/03/2017 1030   CL 102 03/30/2019 1108   CO2 26 03/30/2019 1108   CO2 27 04/03/2017 1030   BUN 12 03/30/2019 1108   BUN 15.5 04/03/2017 1030   CREATININE 0.83 03/30/2019  1108   CREATININE 0.8 04/03/2017 1030      Component Value Date/Time   CALCIUM 8.9 03/30/2019 1108   CALCIUM 9.4 04/03/2017 1030   ALKPHOS 94 03/30/2019 1108   ALKPHOS 97 04/03/2017 1030   AST 16 03/30/2019 1108   AST 14 04/03/2017 1030   ALT 15 03/30/2019 1108   ALT 21 04/03/2017 1030   BILITOT 0.9 03/30/2019 1108   BILITOT 0.67 04/03/2017 1030       RADIOGRAPHIC STUDIES:  CT Chest W Contrast  Result Date: 03/30/2019 CLINICAL DATA:  Restaging lung cancer. EXAM: CT CHEST, ABDOMEN, AND PELVIS WITH CONTRAST TECHNIQUE: Multidetector CT imaging of the chest, abdomen and pelvis was performed following the standard protocol during bolus administration of intravenous contrast. CONTRAST:  167mL OMNIPAQUE IOHEXOL 300 MG/ML  SOLN  COMPARISON:  02/06/2019 FINDINGS: CT CHEST FINDINGS Cardiovascular: The heart size is normal. No pericardial effusion identified. Aortic atherosclerosis. Lad and RCA coronary artery atherosclerotic calcifications. Mediastinum/Nodes: Normal appearance of the thyroid gland. The trachea appears patent and is midline. Normal appearance of the esophagus. Index subcarinal lymph node measures 1.4 cm, image 30/2. Previously this measured the same. Lungs/Pleura: No pleural effusion. The previous solid spiculated nodule in right upper lobe measures 1.5 cm, image 44/4. Unchanged. There is diffuse thickening with ground-glass attenuation involving the peribronchovascular interstitium throughout both lungs. This has increased when compared with the previous examination. Additionally, there are multiple irregular configured solid and ground-glass nodules are noted throughout both lungs. Some of these nodules exhibit central cavitation. When compared with the previous exam these nodules have increased in size and number. This includes: -nodule within the lingula measures 2.5 x 1.6 cm, image 80/4. On the previous exam this measured 2.3 x 2.4 cm. -Cavitary nodule within the left upper lobe measures 2.4 cm, image 41/4. Previously 1.4 cm. -Left upper lobe lung nodule measures 1.1 cm, image 44/4. Previously 0.3 cm. -Anterior left upper lobe lung nodule measures 1.6 cm, image 55/4. Previously 0.7 cm. -1.3 cm right lower lobe lung nodule is identified, image 111/4. Previously 1.0 cm. -Lateral right lung base nodule measures 0.9 cm, image 111/4. New from previous exam. Musculoskeletal: No chest wall mass or suspicious bone lesions identified. CT ABDOMEN PELVIS FINDINGS Hepatobiliary: Mildly nodular contour the liver suggestive of cirrhosis. No focal liver abnormality identified. The gallbladder appears normal. No biliary dilatation. Pancreas: Unremarkable. No pancreatic ductal dilatation or surrounding inflammatory changes. Spleen: Normal  in size without focal abnormality. Adrenals/Urinary Tract: Nodule within the right adrenal gland measures 1.7 cm, image 60/2. Unchanged. Normal left adrenal gland. Unchanged cyst within upper pole of right kidney measuring 2.3 cm, image 73/6. Small stone within the interpolar collecting system of the right kidney measures 2-3 mm. No suspicious mass or hydronephrosis identified bilaterally. Urinary bladder appears normal. Stomach/Bowel: Stomach is within normal limits. Appendix appears normal. No evidence of bowel wall thickening, distention, or inflammatory changes. Sigmoid diverticulosis. Vascular/Lymphatic: Aortic atherosclerosis. Infrarenal abdominal aorta measures 2.9 cm, image 87/2. Unchanged 1.5 cm porta hepatic lymph node, image 64/2. No pelvic or inguinal adenopathy. Reproductive: Prostate is unremarkable. Other: No free fluid or fluid collections. Musculoskeletal: Degenerative disc disease identified at L5-S1. IMPRESSION: 1. The index solid spiculated nodule within the right upper lobe the is stable in size in the interval. Borderline enlarged subcarinal lymph node is also unchanged in the interval. 2. Interval progression of diffuse peribronchovascular interstitial thickening and ground-glass attenuation. Previously characterized irregular ground-glass and solid nodules scattered throughout both lungs have increased in size and multiplicity.  As mention on the previous exam these are indeterminate and may be sequelae of atypical infection, immunotherapy related pneumonitis, or metastatic disease. 3. Aortic atherosclerosis. Multi vessel coronary artery calcifications noted. 4. Morphologic features of the liver suggestive of cirrhosis. 5. Stable right adrenal gland nodule. Aortic Atherosclerosis (ICD10-I70.0). Electronically Signed   By: Kerby Moors M.D.   On: 03/30/2019 12:19   CT Abdomen Pelvis W Contrast  Result Date: 03/30/2019 CLINICAL DATA:  Restaging lung cancer. EXAM: CT CHEST, ABDOMEN, AND  PELVIS WITH CONTRAST TECHNIQUE: Multidetector CT imaging of the chest, abdomen and pelvis was performed following the standard protocol during bolus administration of intravenous contrast. CONTRAST:  141mL OMNIPAQUE IOHEXOL 300 MG/ML  SOLN COMPARISON:  02/06/2019 FINDINGS: CT CHEST FINDINGS Cardiovascular: The heart size is normal. No pericardial effusion identified. Aortic atherosclerosis. Lad and RCA coronary artery atherosclerotic calcifications. Mediastinum/Nodes: Normal appearance of the thyroid gland. The trachea appears patent and is midline. Normal appearance of the esophagus. Index subcarinal lymph node measures 1.4 cm, image 30/2. Previously this measured the same. Lungs/Pleura: No pleural effusion. The previous solid spiculated nodule in right upper lobe measures 1.5 cm, image 44/4. Unchanged. There is diffuse thickening with ground-glass attenuation involving the peribronchovascular interstitium throughout both lungs. This has increased when compared with the previous examination. Additionally, there are multiple irregular configured solid and ground-glass nodules are noted throughout both lungs. Some of these nodules exhibit central cavitation. When compared with the previous exam these nodules have increased in size and number. This includes: -nodule within the lingula measures 2.5 x 1.6 cm, image 80/4. On the previous exam this measured 2.3 x 2.4 cm. -Cavitary nodule within the left upper lobe measures 2.4 cm, image 41/4. Previously 1.4 cm. -Left upper lobe lung nodule measures 1.1 cm, image 44/4. Previously 0.3 cm. -Anterior left upper lobe lung nodule measures 1.6 cm, image 55/4. Previously 0.7 cm. -1.3 cm right lower lobe lung nodule is identified, image 111/4. Previously 1.0 cm. -Lateral right lung base nodule measures 0.9 cm, image 111/4. New from previous exam. Musculoskeletal: No chest wall mass or suspicious bone lesions identified. CT ABDOMEN PELVIS FINDINGS Hepatobiliary: Mildly nodular  contour the liver suggestive of cirrhosis. No focal liver abnormality identified. The gallbladder appears normal. No biliary dilatation. Pancreas: Unremarkable. No pancreatic ductal dilatation or surrounding inflammatory changes. Spleen: Normal in size without focal abnormality. Adrenals/Urinary Tract: Nodule within the right adrenal gland measures 1.7 cm, image 60/2. Unchanged. Normal left adrenal gland. Unchanged cyst within upper pole of right kidney measuring 2.3 cm, image 73/6. Small stone within the interpolar collecting system of the right kidney measures 2-3 mm. No suspicious mass or hydronephrosis identified bilaterally. Urinary bladder appears normal. Stomach/Bowel: Stomach is within normal limits. Appendix appears normal. No evidence of bowel wall thickening, distention, or inflammatory changes. Sigmoid diverticulosis. Vascular/Lymphatic: Aortic atherosclerosis. Infrarenal abdominal aorta measures 2.9 cm, image 87/2. Unchanged 1.5 cm porta hepatic lymph node, image 64/2. No pelvic or inguinal adenopathy. Reproductive: Prostate is unremarkable. Other: No free fluid or fluid collections. Musculoskeletal: Degenerative disc disease identified at L5-S1. IMPRESSION: 1. The index solid spiculated nodule within the right upper lobe the is stable in size in the interval. Borderline enlarged subcarinal lymph node is also unchanged in the interval. 2. Interval progression of diffuse peribronchovascular interstitial thickening and ground-glass attenuation. Previously characterized irregular ground-glass and solid nodules scattered throughout both lungs have increased in size and multiplicity. As mention on the previous exam these are indeterminate and may be sequelae of atypical infection, immunotherapy related  pneumonitis, or metastatic disease. 3. Aortic atherosclerosis. Multi vessel coronary artery calcifications noted. 4. Morphologic features of the liver suggestive of cirrhosis. 5. Stable right adrenal gland  nodule. Aortic Atherosclerosis (ICD10-I70.0). Electronically Signed   By: Kerby Moors M.D.   On: 03/30/2019 12:19     ASSESSMENT/PLAN:  This is a very pleasant 68 year old Caucasian male with a stage IV non-small cell lung cancer, squamous cell carcinoma. The patient is currently undergoing systemic chemotherapy with carboplatin, paclitaxel and Keytruda status post 4 cycles with partial response.  He recently completed 35 cycles of maintenance treatment with single agent Keytruda. On his CT scan from 11/6 he had diffuse bilateral peribronchovascular groundglass nodularity suspicious for atypical pneumonia/metastatic disease but also could be immunotherapy mediated inflammatory process. He was started on doxycycline and 10 mg p.o. daily of prednisone. He completed the course of antibiotics and prednisone.   The patient recently had a restaging CT scan performed. Dr. Julien Nordmann personally and independently reviewed the scan and discussed the results with the patient today. The scan did not show any concerning evidence of disease progression but did not interval progression of the diffuse peribronchovascular interstitial thickening and ground glass attenuation. Dr. Julien Nordmann believes this remains suspicious for pneumonitis and recommends that the patient take a high dose prednisone taper.   The patient was instructed to take 120 mg of prednisone daily for 2 weeks, followed by 100 mg daily for 1 week, followed by 80 mg daily for 1 week, followed by 60 mg daily for one week, followed by 40 mg daily for one week, followed by 20 mg daily for one week followed by 10 mg daily for one week. The patient was advised to call us if he develops thrush.   Dr. Julien Nordmann recommends that the patient continue on observation with a repeat CT scan of the chest, abdomen, and pelvis in 2 months.   We will see the patient back for a follow up visit in 2 months for evaluation and to review his restaging CT scan.   The patient  was advised to call immediately if she has any concerning symptoms in the interval. The patient voices understanding of current disease status and treatment options and is in agreement with the current care plan. All questions were answered. The patient knows to call the clinic with any problems, questions or concerns. We can certainly see the patient much sooner if necessary  No orders of the defined types were placed in this encounter.    Ruba Outen L Ashle Stief, PA-C 03/31/19  ADDENDUM: Hematology/Oncology Attending: I had a face-to-face encounter with the patient today.  I recommended his care plan.  This is a very pleasant 68 years old white male with stage IV non-small cell lung cancer, squamous cell carcinoma status post induction systemic chemotherapy with carboplatin, paclitaxel and Keytruda for 4 cycles followed by maintenance treatment with Keytruda for a total of 2 years.  The patient has been tolerating his treatment with Keytruda fairly well with no significant adverse effects.  He was noted on previous CT scan of the chest to have suspicious immunotherapy mediated pneumonitis.  The patient had repeat CT scan of the chest performed recently.  I personally and independently reviewed the scan images and discussed the result with the patient and his wife. 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 I recommended for the patient to start treatment with a tapered dose of prednisone over the next 6  weeks. We also advised the patient to call immediately if he has any oral thrush or fungal infection during his treatment with the steroid. I will see him back for follow-up visit in 2 months with repeat CT scan of the chest, abdomen pelvis for restaging of his disease. He was advised to call immediately if he has any concerning symptoms in the interval.  Disclaimer: This note was dictated with voice  recognition software. Similar sounding words can inadvertently be transcribed and may be missed upon review. Eilleen Kempf, MD 04/01/19

## 2019-04-01 ENCOUNTER — Inpatient Hospital Stay (HOSPITAL_BASED_OUTPATIENT_CLINIC_OR_DEPARTMENT_OTHER): Payer: Medicare Other | Admitting: Physician Assistant

## 2019-04-01 ENCOUNTER — Other Ambulatory Visit: Payer: Self-pay

## 2019-04-01 ENCOUNTER — Encounter: Payer: Self-pay | Admitting: Physician Assistant

## 2019-04-01 VITALS — BP 151/84 | HR 64 | Temp 98.5°F | Resp 17 | Ht 73.0 in | Wt 285.4 lb

## 2019-04-01 DIAGNOSIS — I10 Essential (primary) hypertension: Secondary | ICD-10-CM

## 2019-04-01 DIAGNOSIS — C3491 Malignant neoplasm of unspecified part of right bronchus or lung: Secondary | ICD-10-CM | POA: Diagnosis not present

## 2019-04-01 DIAGNOSIS — C3411 Malignant neoplasm of upper lobe, right bronchus or lung: Secondary | ICD-10-CM | POA: Diagnosis not present

## 2019-04-01 MED ORDER — PREDNISONE 20 MG PO TABS: ORAL_TABLET | ORAL | 0 refills | Status: AC

## 2019-04-02 ENCOUNTER — Telehealth: Payer: Self-pay | Admitting: Internal Medicine

## 2019-04-02 NOTE — Telephone Encounter (Signed)
Scheduled per los. Called and spoke with glenda. Confirmed appt

## 2019-04-14 ENCOUNTER — Other Ambulatory Visit: Payer: Self-pay | Admitting: Internal Medicine

## 2019-04-14 DIAGNOSIS — G6289 Other specified polyneuropathies: Secondary | ICD-10-CM

## 2019-05-07 ENCOUNTER — Encounter (HOSPITAL_COMMUNITY): Payer: Self-pay | Admitting: Emergency Medicine

## 2019-05-07 ENCOUNTER — Inpatient Hospital Stay (HOSPITAL_COMMUNITY)
Admission: EM | Admit: 2019-05-07 | Discharge: 2019-06-01 | DRG: 308 | Disposition: E | Payer: Medicare Other | Source: Ambulatory Visit | Attending: Internal Medicine | Admitting: Internal Medicine

## 2019-05-07 ENCOUNTER — Telehealth: Payer: Self-pay | Admitting: Medical Oncology

## 2019-05-07 ENCOUNTER — Emergency Department (HOSPITAL_COMMUNITY): Payer: Medicare Other

## 2019-05-07 ENCOUNTER — Other Ambulatory Visit: Payer: Self-pay

## 2019-05-07 DIAGNOSIS — R040 Epistaxis: Secondary | ICD-10-CM | POA: Diagnosis not present

## 2019-05-07 DIAGNOSIS — J9601 Acute respiratory failure with hypoxia: Secondary | ICD-10-CM

## 2019-05-07 DIAGNOSIS — I4892 Unspecified atrial flutter: Secondary | ICD-10-CM | POA: Diagnosis present

## 2019-05-07 DIAGNOSIS — Z823 Family history of stroke: Secondary | ICD-10-CM

## 2019-05-07 DIAGNOSIS — K567 Ileus, unspecified: Secondary | ICD-10-CM | POA: Diagnosis not present

## 2019-05-07 DIAGNOSIS — Z7189 Other specified counseling: Secondary | ICD-10-CM

## 2019-05-07 DIAGNOSIS — C3491 Malignant neoplasm of unspecified part of right bronchus or lung: Secondary | ICD-10-CM | POA: Diagnosis not present

## 2019-05-07 DIAGNOSIS — Z95828 Presence of other vascular implants and grafts: Secondary | ICD-10-CM

## 2019-05-07 DIAGNOSIS — B49 Unspecified mycosis: Secondary | ICD-10-CM | POA: Diagnosis present

## 2019-05-07 DIAGNOSIS — Z978 Presence of other specified devices: Secondary | ICD-10-CM | POA: Diagnosis not present

## 2019-05-07 DIAGNOSIS — I10 Essential (primary) hypertension: Secondary | ICD-10-CM | POA: Diagnosis present

## 2019-05-07 DIAGNOSIS — I493 Ventricular premature depolarization: Secondary | ICD-10-CM | POA: Diagnosis present

## 2019-05-07 DIAGNOSIS — Z87442 Personal history of urinary calculi: Secondary | ICD-10-CM

## 2019-05-07 DIAGNOSIS — E871 Hypo-osmolality and hyponatremia: Secondary | ICD-10-CM | POA: Diagnosis not present

## 2019-05-07 DIAGNOSIS — R0902 Hypoxemia: Secondary | ICD-10-CM | POA: Diagnosis not present

## 2019-05-07 DIAGNOSIS — Z515 Encounter for palliative care: Secondary | ICD-10-CM | POA: Diagnosis not present

## 2019-05-07 DIAGNOSIS — I959 Hypotension, unspecified: Secondary | ICD-10-CM | POA: Diagnosis not present

## 2019-05-07 DIAGNOSIS — Z0184 Encounter for antibody response examination: Secondary | ICD-10-CM

## 2019-05-07 DIAGNOSIS — E785 Hyperlipidemia, unspecified: Secondary | ICD-10-CM | POA: Diagnosis present

## 2019-05-07 DIAGNOSIS — Z20822 Contact with and (suspected) exposure to covid-19: Secondary | ICD-10-CM | POA: Diagnosis present

## 2019-05-07 DIAGNOSIS — E875 Hyperkalemia: Secondary | ICD-10-CM | POA: Diagnosis not present

## 2019-05-07 DIAGNOSIS — Z923 Personal history of irradiation: Secondary | ICD-10-CM

## 2019-05-07 DIAGNOSIS — E669 Obesity, unspecified: Secondary | ICD-10-CM | POA: Diagnosis present

## 2019-05-07 DIAGNOSIS — Z6830 Body mass index (BMI) 30.0-30.9, adult: Secondary | ICD-10-CM

## 2019-05-07 DIAGNOSIS — C7989 Secondary malignant neoplasm of other specified sites: Secondary | ICD-10-CM | POA: Diagnosis present

## 2019-05-07 DIAGNOSIS — R140 Abdominal distension (gaseous): Secondary | ICD-10-CM

## 2019-05-07 DIAGNOSIS — I4891 Unspecified atrial fibrillation: Secondary | ICD-10-CM

## 2019-05-07 DIAGNOSIS — Z66 Do not resuscitate: Secondary | ICD-10-CM | POA: Diagnosis not present

## 2019-05-07 DIAGNOSIS — C7802 Secondary malignant neoplasm of left lung: Secondary | ICD-10-CM | POA: Diagnosis present

## 2019-05-07 DIAGNOSIS — T451X5A Adverse effect of antineoplastic and immunosuppressive drugs, initial encounter: Secondary | ICD-10-CM | POA: Diagnosis present

## 2019-05-07 DIAGNOSIS — J189 Pneumonia, unspecified organism: Secondary | ICD-10-CM

## 2019-05-07 DIAGNOSIS — Z9221 Personal history of antineoplastic chemotherapy: Secondary | ICD-10-CM

## 2019-05-07 DIAGNOSIS — Z9289 Personal history of other medical treatment: Secondary | ICD-10-CM

## 2019-05-07 DIAGNOSIS — D696 Thrombocytopenia, unspecified: Secondary | ICD-10-CM | POA: Diagnosis not present

## 2019-05-07 DIAGNOSIS — J984 Other disorders of lung: Secondary | ICD-10-CM

## 2019-05-07 DIAGNOSIS — J704 Drug-induced interstitial lung disorders, unspecified: Secondary | ICD-10-CM | POA: Diagnosis present

## 2019-05-07 DIAGNOSIS — J9621 Acute and chronic respiratory failure with hypoxia: Secondary | ICD-10-CM | POA: Diagnosis not present

## 2019-05-07 DIAGNOSIS — R600 Localized edema: Secondary | ICD-10-CM | POA: Diagnosis present

## 2019-05-07 DIAGNOSIS — Z8249 Family history of ischemic heart disease and other diseases of the circulatory system: Secondary | ICD-10-CM

## 2019-05-07 DIAGNOSIS — Z801 Family history of malignant neoplasm of trachea, bronchus and lung: Secondary | ICD-10-CM

## 2019-05-07 DIAGNOSIS — Z4659 Encounter for fitting and adjustment of other gastrointestinal appliance and device: Secondary | ICD-10-CM

## 2019-05-07 DIAGNOSIS — N400 Enlarged prostate without lower urinary tract symptoms: Secondary | ICD-10-CM | POA: Diagnosis present

## 2019-05-07 DIAGNOSIS — Z87891 Personal history of nicotine dependence: Secondary | ICD-10-CM

## 2019-05-07 DIAGNOSIS — J449 Chronic obstructive pulmonary disease, unspecified: Secondary | ICD-10-CM | POA: Diagnosis present

## 2019-05-07 DIAGNOSIS — C3411 Malignant neoplasm of upper lobe, right bronchus or lung: Secondary | ICD-10-CM | POA: Diagnosis present

## 2019-05-07 DIAGNOSIS — Z96652 Presence of left artificial knee joint: Secondary | ICD-10-CM | POA: Diagnosis present

## 2019-05-07 DIAGNOSIS — J80 Acute respiratory distress syndrome: Secondary | ICD-10-CM | POA: Diagnosis present

## 2019-05-07 DIAGNOSIS — I4819 Other persistent atrial fibrillation: Secondary | ICD-10-CM | POA: Diagnosis present

## 2019-05-07 DIAGNOSIS — Z789 Other specified health status: Secondary | ICD-10-CM

## 2019-05-07 LAB — CBC WITH DIFFERENTIAL/PLATELET
Abs Immature Granulocytes: 0.19 10*3/uL — ABNORMAL HIGH (ref 0.00–0.07)
Basophils Absolute: 0 10*3/uL (ref 0.0–0.1)
Basophils Relative: 0 %
Eosinophils Absolute: 0 10*3/uL (ref 0.0–0.5)
Eosinophils Relative: 0 %
HCT: 48.5 % (ref 39.0–52.0)
Hemoglobin: 16 g/dL (ref 13.0–17.0)
Immature Granulocytes: 2 %
Lymphocytes Relative: 9 %
Lymphs Abs: 1.1 10*3/uL (ref 0.7–4.0)
MCH: 28.6 pg (ref 26.0–34.0)
MCHC: 33 g/dL (ref 30.0–36.0)
MCV: 86.6 fL (ref 80.0–100.0)
Monocytes Absolute: 0.4 10*3/uL (ref 0.1–1.0)
Monocytes Relative: 4 %
Neutro Abs: 10.3 10*3/uL — ABNORMAL HIGH (ref 1.7–7.7)
Neutrophils Relative %: 85 %
Platelets: 229 10*3/uL (ref 150–400)
RBC: 5.6 MIL/uL (ref 4.22–5.81)
RDW: 13.4 % (ref 11.5–15.5)
WBC: 12.1 10*3/uL — ABNORMAL HIGH (ref 4.0–10.5)
nRBC: 0 % (ref 0.0–0.2)

## 2019-05-07 LAB — COMPREHENSIVE METABOLIC PANEL
ALT: 38 U/L (ref 0–44)
AST: 31 U/L (ref 15–41)
Albumin: 3.4 g/dL — ABNORMAL LOW (ref 3.5–5.0)
Alkaline Phosphatase: 85 U/L (ref 38–126)
Anion gap: 12 (ref 5–15)
BUN: 17 mg/dL (ref 8–23)
CO2: 24 mmol/L (ref 22–32)
Calcium: 8.7 mg/dL — ABNORMAL LOW (ref 8.9–10.3)
Chloride: 98 mmol/L (ref 98–111)
Creatinine, Ser: 0.81 mg/dL (ref 0.61–1.24)
GFR calc Af Amer: >60 mL/min (ref >60)
GFR calc non Af Amer: >60 mL/min (ref >60)
Glucose, Bld: 136 mg/dL — ABNORMAL HIGH (ref 70–99)
Potassium: 3.8 mmol/L (ref 3.5–5.1)
Sodium: 134 mmol/L — ABNORMAL LOW (ref 135–145)
Total Bilirubin: 1.9 mg/dL — ABNORMAL HIGH (ref 0.3–1.2)
Total Protein: 7.1 g/dL (ref 6.5–8.1)

## 2019-05-07 LAB — BRAIN NATRIURETIC PEPTIDE: B Natriuretic Peptide: 104 pg/mL — ABNORMAL HIGH (ref 0.0–100.0)

## 2019-05-07 LAB — POC SARS CORONAVIRUS 2 AG -  ED: SARS Coronavirus 2 Ag: NEGATIVE

## 2019-05-07 MED ORDER — SODIUM CHLORIDE 0.9 % IV SOLN: 250.0000 mL | INTRAVENOUS | Status: DC | PRN

## 2019-05-07 MED ORDER — SODIUM CHLORIDE 0.9% FLUSH
3.0000 mL | INTRAVENOUS | Status: DC | PRN
  Administered 2019-05-13: 09:00:00 3 mL via INTRAVENOUS

## 2019-05-07 MED ORDER — METOPROLOL TARTRATE 50 MG PO TABS
50.0000 mg | ORAL_TABLET | Freq: Two times a day (BID) | ORAL | Status: DC
  Administered 2019-05-07 – 2019-05-08 (×2): 50 mg via ORAL
  Filled 2019-05-07 (×2): qty 1

## 2019-05-07 MED ORDER — ENOXAPARIN SODIUM 40 MG/0.4ML ~~LOC~~ SOLN
40.0000 mg | Freq: Every day | SUBCUTANEOUS | Status: DC
  Administered 2019-05-08 – 2019-05-09 (×3): 40 mg via SUBCUTANEOUS
  Filled 2019-05-07 (×3): qty 0.4

## 2019-05-07 MED ORDER — TAMSULOSIN HCL 0.4 MG PO CAPS
0.4000 mg | ORAL_CAPSULE | Freq: Every day | ORAL | Status: DC
  Administered 2019-05-08 – 2019-05-09 (×2): 0.4 mg via ORAL
  Filled 2019-05-07 (×4): qty 1

## 2019-05-07 MED ORDER — ACETAMINOPHEN 500 MG PO TABS: 1000.0000 mg | ORAL_TABLET | Freq: Four times a day (QID) | ORAL | Status: DC | PRN

## 2019-05-07 MED ORDER — DILTIAZEM HCL-DEXTROSE 125-5 MG/125ML-% IV SOLN (PREMIX)
5.0000 mg/h | INTRAVENOUS | Status: DC
  Administered 2019-05-07: 18:00:00 5 mg/h via INTRAVENOUS
  Administered 2019-05-08: 15 mg/h via INTRAVENOUS
  Filled 2019-05-07 (×3): qty 125

## 2019-05-07 MED ORDER — ONDANSETRON HCL 4 MG PO TABS: 4.0000 mg | ORAL_TABLET | Freq: Four times a day (QID) | ORAL | Status: DC | PRN

## 2019-05-07 MED ORDER — DILTIAZEM LOAD VIA INFUSION
10.0000 mg | Freq: Once | INTRAVENOUS | Status: AC
  Administered 2019-05-07: 18:00:00 10 mg via INTRAVENOUS
  Filled 2019-05-07: qty 10

## 2019-05-07 MED ORDER — POTASSIUM CHLORIDE CRYS ER 20 MEQ PO TBCR
20.0000 meq | EXTENDED_RELEASE_TABLET | Freq: Once | ORAL | Status: AC
  Administered 2019-05-07: 20 meq via ORAL
  Filled 2019-05-07: qty 1

## 2019-05-07 MED ORDER — ONDANSETRON HCL 4 MG/2ML IJ SOLN: 4.0000 mg | Freq: Four times a day (QID) | INTRAMUSCULAR | Status: DC | PRN

## 2019-05-07 MED ORDER — GABAPENTIN 300 MG PO CAPS
300.0000 mg | ORAL_CAPSULE | Freq: Two times a day (BID) | ORAL | Status: DC
  Administered 2019-05-08 – 2019-05-14 (×6): 300 mg via ORAL
  Filled 2019-05-07 (×8): qty 1

## 2019-05-07 MED ORDER — ATORVASTATIN CALCIUM 10 MG PO TABS
10.0000 mg | ORAL_TABLET | Freq: Every evening | ORAL | Status: DC
  Administered 2019-05-08 – 2019-05-13 (×4): 10 mg via ORAL
  Filled 2019-05-07 (×6): qty 1

## 2019-05-07 MED ORDER — FUROSEMIDE 10 MG/ML IJ SOLN
40.0000 mg | Freq: Once | INTRAMUSCULAR | Status: AC
  Administered 2019-05-07: 40 mg via INTRAVENOUS
  Filled 2019-05-07: qty 4

## 2019-05-07 MED ORDER — SODIUM CHLORIDE 0.9% FLUSH
3.0000 mL | Freq: Two times a day (BID) | INTRAVENOUS | Status: DC
  Administered 2019-05-09 – 2019-05-15 (×11): 3 mL via INTRAVENOUS

## 2019-05-07 NOTE — H&P (Signed)
TRH H&P    Patient Demographics:    Aaron Sellers, is a 69 y.o. male  MRN: [id number]  DOB - Apr 25, 1950  Admit Date - 05/31/2019  Referring MD/NP/PA: Kennith Maes  Outpatient Primary MD for the patient is Sheryle Hail, PA-C  Patient coming from: Home  Chief complaint-shortness of breath   HPI:    Aaron Sellers  is a 69 y.o. 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  Patient states that he has been having shortness of breath with palpitations and at times was lightheaded.  Denies any chest pain.  Denies nausea vomiting or diarrhea.  Denies coughing up any phlegm. In the ED patient was found to be in A. fib with RVR, started on IV Cardizem. He denies abdominal pain or dysuria     Review of systems:    In addition to the HPI above,    All other systems reviewed and are negative.    Past History of the following :    Past Medical History:  Diagnosis Date  . Arthritis   . Atrial flutter ([place])   . BPH (benign prostatic hypertrophy) with urinary retention   . Cancer ([place])   . Chronic kidney disease    hx of kidney stones  2011  . COPD (chronic obstructive pulmonary disease) ([place])    has been smoking since he was 22 ish--  . High cholesterol   . Hypertension    dx around 2011  . Squamous cell carcinoma of right lung [facility])       Past Surgical History:  Procedure Laterality Date  . HERNIA REPAIR     umbilical hernia  08/6810  . INCISION AND DRAINAGE ABSCESS     "down the middle" of his buttocks  2015  . TOTAL KNEE ARTHROPLASTY Left 10/29/2014   Procedure: TOTAL KNEE ARTHROPLASTY;  Surgeon: Dorna Leitz, MD;  Location: [facility];  Service:  Orthopedics;  Laterality: Left;      Social History:      Social History   Tobacco Use  . Smoking status: Former Smoker    Packs/day: 1.00    Years: 40.00    Pack years: 40.00    Types: Cigarettes    Quit date: 01/27/2017    Years since quitting: 2.2  . Smokeless tobacco: Former Systems developer    Types: Snuff  Substance Use Topics  . Alcohol use: No       Family History :     Family History  Problem Relation Age of Onset  . Hypertension Mother   . CVA Father   . Lung cancer Sister   . Lung cancer Brother   . Hypertension Brother   . Hypertension Sister       Home Medications:   Prior to Admission medications   Medication Sig Start Date End Date Taking? Authorizing Provider  acetaminophen (TYLENOL)  500 MG tablet Take 1,000 mg by mouth every 6 (six) hours as needed for moderate pain.   Yes [provider]  atorvastatin (LIPITOR) 10 MG tablet Take 10 mg by mouth every evening.    Yes [provider]  CARTIA XT 180 MG 24 hr capsule Take 1 capsule by mouth once daily Patient taking differently: Take 180 mg by mouth daily.  02/23/19  Yes BranchAlphonse Guild, MD  Cyanocobalamin (VITAMIN B-12 PO) Take 1 tablet by mouth daily.   Yes [provider]  famotidine (PEPCID) 10 MG tablet Take 10 mg by mouth as needed for heartburn or indigestion.   Yes [provider]  furosemide (LASIX) 40 MG tablet Take 1 tablet (40 mg total) by mouth daily as needed. 10/17/17 03/18/20 Yes Branch, Alphonse Guild, MD  gabapentin (NEURONTIN) 300 MG capsule TAKE 1 CAPSULE BY MOUTH THREE TIMES DAILY Patient taking differently: Take 300 mg by mouth 2 (two) times daily.  04/14/19  Yes Curt Bears, MD  loratadine (CLARITIN) 10 MG tablet Take 10 mg by mouth daily as needed for allergies.    Yes [provider]  tamsulosin (FLOMAX) 0.4 MG CAPS capsule Take 0.4 mg by mouth daily.   Yes [provider]  terbinafine ([employer] ATHLETES FOOT) 1 % cream Apply 1  application topically daily.   Yes [provider]  predniSONE (DELTASONE) 20 MG tablet Take 6 tablets daily for 2 weeks, then take 5 tablets daily for 1 week, then take 4 tablets for 1 week, then take 3 tablets for 1 week, then take 2 tablet for 1 week, then take 1 tablet for 1 week, then take 0.5 tablets for 1 week. Patient not taking: Reported on 05/11/2019 04/01/19   Heilingoetter, Cassandra L, PA-C     Allergies:     Allergies  Allergen Reactions  . Ciprofloxacin     HIVES  . Adhesive [Tape] Other (See Comments)    Skin peels  . Codeine Other (See Comments)    Jitters, "skin crawling"     Physical Exam:   Vitals  Blood pressure (!) 106/53, pulse (!) 160, temperature 98.3 F (36.8 C), temperature source Oral, resp. rate (!) 34, height '6\' 8"'$  (2.032 m), weight 129.3 kg, SpO2 94 %.  1.  General: Appears in no acute distress  2. Psychiatric: Alert, oriented x3, intact insight and judgment  3. Neurologic: Cranial nerves II through XII grossly intact, motor strength 5/5 in all extremities  4. HEENMT:  Atraumatic normocephalic, extraocular muscles are intact  5. Respiratory : Bibasilar crackles auscultated  6. Cardiovascular : S1-S2, irregular, no murmur auscultated, bilateral 1+ pitting edema of the lower extremities  7. Gastrointestinal:  Abdomen is soft, nontender, no organomegaly     Data Review:    CBC Recent Labs  Lab 05/08/2019 1719  WBC 12.1*  HGB 16.0  HCT 48.5  PLT 229  MCV 86.6  MCH 28.6  MCHC 33.0  RDW 13.4  LYMPHSABS 1.1  MONOABS 0.4  EOSABS 0.0  BASOSABS 0.0   ------------------------------------------------------------------------------------------------------------------  Results for orders placed or performed during the hospital encounter of 05/09/2019 (from the past 48 hour(s))  Comprehensive metabolic panel     Status: Abnormal   Collection Time: 05/29/2019  5:19 PM  Result Value Ref Range   Sodium 134 (L) 135 - 145 mmol/L    Potassium 3.8 3.5 - 5.1 mmol/L   Chloride 98 98 - 111 mmol/L   CO2 24 22 - 32 mmol/L   Glucose,  Bld 136 (H) 70 - 99 mg/dL   BUN 17 8 - 23 mg/dL   Creatinine, Ser 0.81 0.61 - 1.24 mg/dL   Calcium 8.7 (L) 8.9 - 10.3 mg/dL   Total Protein 7.1 6.5 - 8.1 g/dL   Albumin 3.4 (L) 3.5 - 5.0 g/dL   AST 31 15 - 41 U/L   ALT 38 0 - 44 U/L   Alkaline Phosphatase 85 38 - 126 U/L   Total Bilirubin 1.9 (H) 0.3 - 1.2 mg/dL   GFR calc non Af Amer >60 >60 mL/min   GFR calc Af Amer >60 >60 mL/min   Anion gap 12 5 - 15    Comment: Performed at [facility], [address], [place], [place] [address]  CBC with Differential     Status: Abnormal   Collection Time: 05/06/2019  5:19 PM  Result Value Ref Range   WBC 12.1 (H) 4.0 - 10.5 K/uL   RBC 5.60 4.22 - 5.81 MIL/uL   Hemoglobin 16.0 13.0 - 17.0 g/dL   HCT 48.5 39.0 - 52.0 %   MCV 86.6 80.0 - 100.0 fL   MCH 28.6 26.0 - 34.0 pg   MCHC 33.0 30.0 - 36.0 g/dL   RDW 13.4 11.5 - 15.5 %   Platelets 229 150 - 400 K/uL   nRBC 0.0 0.0 - 0.2 %   Neutrophils Relative % 85 %   Neutro Abs 10.3 (H) 1.7 - 7.7 K/uL   Lymphocytes Relative 9 %   Lymphs Abs 1.1 0.7 - 4.0 K/uL   Monocytes Relative 4 %   Monocytes Absolute 0.4 0.1 - 1.0 K/uL   Eosinophils Relative 0 %   Eosinophils Absolute 0.0 0.0 - 0.5 K/uL   Basophils Relative 0 %   Basophils Absolute 0.0 0.0 - 0.1 K/uL   Immature Granulocytes 2 %   Abs Immature Granulocytes 0.19 (H) 0.00 - 0.07 K/uL    Comment: Performed at [facility], [address], [place], [place] [address]  Brain natriuretic peptide     Status: Abnormal   Collection Time: 05/22/2019  5:20 PM  Result Value Ref Range   B Natriuretic Peptide 104.0 (H) 0.0 - 100.0 pg/mL    Comment: Performed at [facility] [address], [place], [place] [address]  POC SARS Coronavirus 2 Ag-ED - Nasal Swab (BD Veritor Kit)     Status: None   Collection Time: 05/25/2019  6:15 PM  Result Value Ref Range   SARS Coronavirus 2 Ag NEGATIVE NEGATIVE    Comment:  (NOTE) SARS-CoV-2 antigen NOT DETECTED.  Negative results are presumptive.  Negative results do not preclude SARS-CoV-2 infection and should not be used as the sole basis for treatment or other patient management decisions, including infection  control decisions, particularly in the presence of clinical signs and  symptoms consistent with COVID-19, or in those who have been in contact with the virus.  Negative results must be combined with clinical observations, patient history, and epidemiological information. The expected result is Negative. Fact Sheet for Patients: PodPark.tn Fact Sheet for Healthcare Providers: GiftContent.is This test is not yet approved or cleared by the [place] FDA and  has been authorized for detection and/or diagnosis of SARS-CoV-2 by FDA under an Emergency Use Authorization (EUA).  This EUA will remain in effect (meaning this test can be used) for the duration of  the COVID-19 de claration under Section 564(b)(1) of the Act, 21 U.S.C. section 360bbb-3(b)(1), unless the authorization is terminated or revoked sooner.  Chemistries  Recent Labs  Lab 05/19/2019 1719  NA 134*  K 3.8  CL 98  CO2 24  GLUCOSE 136*  BUN 17  CREATININE 0.81  CALCIUM 8.7*  AST 31  ALT 38  ALKPHOS 85  BILITOT 1.9*   ------------------------------------------------------------------------------------------------------------------  ------------------------------------------------------------------------------------------------------------------ GFR: Estimated Creatinine Clearance: 138.7 mL/min (by C-G formula based on SCr of 0.81 mg/dL). Liver Function Tests: Recent Labs  Lab 05/06/2019 1719  AST 31  ALT 38  ALKPHOS 85  BILITOT 1.9*  PROT 7.1  ALBUMIN 3.4*   No results for input(s): LIPASE, AMYLASE in the last 168 hours. No results for input(s): AMMONIA in the last 168 hours. Coagulation Profile: No  results for input(s): INR, PROTIME in the last 168 hours. Cardiac Enzymes: No results for input(s): CKTOTAL, CKMB, CKMBINDEX, TROPONINI in the last 168 hours. BNP (last 3 results) No results for input(s): PROBNP in the last 8760 hours. HbA1C: No results for input(s): HGBA1C in the last 72 hours. CBG: No results for input(s): GLUCAP in the last 168 hours. Lipid Profile: No results for input(s): CHOL, HDL, LDLCALC, TRIG, CHOLHDL, LDLDIRECT in the last 72 hours. Thyroid Function Tests: No results for input(s): TSH, T4TOTAL, FREET4, T3FREE, THYROIDAB in the last 72 hours. Anemia Panel: No results for input(s): VITAMINB12, FOLATE, FERRITIN, TIBC, IRON, RETICCTPCT in the last 72 hours.  --------------------------------------------------------------------------------------------------------------- Urine analysis:    Component Value Date/Time   COLORURINE AMBER (A) 05/28/2017 1246   APPEARANCEUR HAZY (A) 05/28/2017 1246   LABSPEC 1.025 05/28/2017 1246   LABSPEC 1.010 04/03/2017 1237   PHURINE 5.0 05/28/2017 1246   GLUCOSEU NEGATIVE 05/28/2017 1246   GLUCOSEU Negative 04/03/2017 1237   HGBUR LARGE (A) 05/28/2017 1246   BILIRUBINUR NEGATIVE 05/28/2017 1246   BILIRUBINUR Negative 04/03/2017 1237   KETONESUR NEGATIVE 05/28/2017 1246   PROTEINUR NEGATIVE 05/28/2017 1246   UROBILINOGEN 0.2 04/03/2017 1237   NITRITE NEGATIVE 05/28/2017 1246   LEUKOCYTESUR NEGATIVE 05/28/2017 1246   LEUKOCYTESUR Negative 04/03/2017 1237      Imaging Results:    DG Chest 2 View  Result Date: 05/09/2019 CLINICAL DATA:  Shortness of breath. EXAM: CHEST - 2 VIEW COMPARISON:  January 26, 2017 FINDINGS: Mild infiltrates are seen within the mid left lung and bilateral lung bases, left greater than right. There is a mild amount of pleural fluid seen along the lateral aspect of the mid right lung. No pneumothorax is identified. The cardiac silhouette is mildly enlarged. The visualized skeletal structures are  unremarkable. IMPRESSION: 1. Mild bilateral infiltrates. 2. Small lateral right pleural effusion. A small amount of loculated fluid cannot be excluded. Electronically Signed   By: Virgina Norfolk M.D.   On: 05/25/2019 17:02    My personal review of EKG: Rhythm -atrial fibrillation   Assessment & Plan:    Active Problems:   Atrial fibrillation with RVR (HCC)   1. Atrial fibrillation with RVR-patient started on IV Cardizem in the ED.  Continue Cardizem infusion.  Will admit to stepdown unit.  Patient has not been on anticoagulation as per note from cardiology from December 2019 due to history of stage IV squamous cell carcinoma and perforated bowel in the past.CHA2DS2VASc score is 1.  We will consult cardiology for further recommendations in a.m. 2. Stage IV squamous cell carcinoma-patient followed by oncology as outpatient. 3. Bilateral lower extremity swelling-patient takes Lasix as needed.  Will give 1 dose of Lasix 40 mg IV x1.    DVT Prophylaxis-   Lovenox   AM Labs Ordered, also  please review Full Orders  Family Communication: Admission, patients condition and plan of care including tests being ordered have been discussed with the patient  who indicate understanding and agree with the plan and Code Status.  Code Status: Full code  Admission status: Inpatient: Based on patients clinical presentation and evaluation of above clinical data, I have made determination that patient meets Inpatient criteria at this time.  Time spent in minutes : 60 minutes   Raciel Caffrey S Kayanna Mckillop M.D

## 2019-05-07 NOTE — Telephone Encounter (Signed)
In the past 3 days, "I get out of breath"  .+  cough and  productive of white phlegm. Temp off and on -" highest was  100 f." Denies sore throat , runny nose. He can taste and smell.  Sounds sob on phone. Cough sounds wet. I instructed pt to call PCP now to be evaluated today . I told Glenda to call right back if PCP cannot see him today .

## 2019-05-07 NOTE — ED Provider Notes (Signed)
[facility] EMERGENCY DEPARTMENT Provider Note   CSN: [id number] Arrival date & time: 05/05/2019  1542     History Chief Complaint  Patient presents with  . Shortness of Breath    Aaron Sellers is a 69 y.o. male with a history of paroxysmal aflutter not anticoagulation, CKD, COPD, hypertension, hypercholesterolemia, stage IV squamous cell carcinoma of right lung not currently receiving treatment who presents to the ED with complaints of dyspnea for the past 4 days. Patient states that dyspnea is associated with palpitations and at times lightheaded. Sxs occur somewhat intermittently, more so with activity/position changes, no alleviating factors. No intervention PTA. States he has to clear his throat some but is not necessarily coughing. Denies fever, chills, chest pain, syncope, leg pain/swelling, hemoptysis, recent surgery/trauma, recent long travel, hormone use, or hx of DVT/PE.   HPI     Past Medical History:  Diagnosis Date  . Arthritis   . Atrial flutter ([place])   . BPH (benign prostatic hypertrophy) with urinary retention   . Cancer ([place])   . Chronic kidney disease    hx of kidney stones  2011  . COPD (chronic obstructive pulmonary disease) ([place])    has been smoking since he was 22 ish--  . High cholesterol   . Hypertension    dx around 2011  . Squamous cell carcinoma of right lung [facility])     Patient Active Problem List   Diagnosis Date Noted  . Diverticulitis of sigmoid colon vs Appendicitis 06/02/2017  . Dysuria 04/03/2017  . 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 Encounter for antineoplastic immunotherapy 03/12/2017  . Stage IV squamous cell carcinoma of right lung ([place]) 02/27/2017  . Lung mass   . Atrial flutter ([place])   . Hypokalemia 01/27/2017  . Adrenal mass ([place]) 01/27/2017  . Tobacco abuse 01/27/2017  . COPD (chronic obstructive pulmonary disease) ([place]) 01/27/2017  . Hypertension 01/27/2017  . High cholesterol 01/27/2017  . Chronic  kidney disease 01/27/2017  . Hyponatremia 01/27/2017  . Leukocytosis 01/27/2017  . RLQ abdominal pain 01/27/2017  . Diverticulitis of intestine with perforation 01/26/2017  . Atrial fibrillation with RVR ([place]) 01/26/2017  . Primary osteoarthritis of left knee 12/01/2014    Past Surgical History:  Procedure Laterality Date  . HERNIA REPAIR     umbilical hernia  07/8097  . INCISION AND DRAINAGE ABSCESS     "down the middle" of his buttocks  2015  . TOTAL KNEE ARTHROPLASTY Left 10/29/2014   Procedure: TOTAL KNEE ARTHROPLASTY;  Surgeon: Dorna Leitz, MD;  Location: [place];  Service: Orthopedics;  Laterality: Left;       Family History  Problem Relation Age of Onset  . Hypertension Mother   . CVA Father   . Lung cancer Sister   . Lung cancer Brother   . Hypertension Brother   . Hypertension Sister     Social History   Tobacco Use  . Smoking status: Former Smoker    Packs/day: 1.00    Years: 40.00    Pack years: 40.00    Types: Cigarettes    Quit date: 01/27/2017    Years since quitting: 2.2  . Smokeless tobacco: Former Systems developer    Types: Snuff  Substance Use Topics  . Alcohol use: No  . Drug use: No    Home Medications Prior to Admission medications   Medication Sig Start Date End Date Taking? Authorizing Provider  acetaminophen (TYLENOL) 500 MG tablet Take 1,000 mg by mouth every 6 (  six) hours as needed for moderate pain.    [provider]  atorvastatin (LIPITOR) 10 MG tablet Take 10 mg by mouth every evening.     [provider]  CARTIA XT 180 MG 24 hr capsule Take 1 capsule by mouth once daily 02/23/19   Arnoldo Lenis, MD  Cyanocobalamin (VITAMIN B-12 PO) Take 1 tablet by mouth daily.    [provider]  famotidine (PEPCID) 10 MG tablet Take 10 mg by mouth as needed for heartburn or indigestion.    [provider]  furosemide (LASIX) 40 MG tablet Take 1 tablet (40 mg total) by mouth daily as needed. 10/17/17 03/18/20  Arnoldo Lenis, MD  gabapentin (NEURONTIN) 300 MG capsule TAKE 1 CAPSULE BY MOUTH THREE TIMES DAILY 04/14/19   Curt Bears, MD  loratadine (CLARITIN) 10 MG tablet Take 10 mg by mouth daily.    [provider]  predniSONE (DELTASONE) 20 MG tablet Take 6 tablets daily for 2 weeks, then take 5 tablets daily for 1 week, then take 4 tablets for 1 week, then take 3 tablets for 1 week, then take 2 tablet for 1 week, then take 1 tablet for 1 week, then take 0.5 tablets for 1 week. 04/01/19   Heilingoetter, Cassandra L, PA-C  tamsulosin (FLOMAX) 0.4 MG CAPS capsule Take 0.4 mg by mouth daily.    [provider]    Allergies    Ciprofloxacin, Adhesive [tape], and Codeine  Review of Systems   Review of Systems  Constitutional: Negative for chills and fever.  HENT: Negative for congestion, ear pain and sore throat.   Respiratory: Positive for cough ("clearing throat") and shortness of breath. Negative for wheezing.   Cardiovascular: Positive for palpitations. Negative for chest pain and leg swelling.  Gastrointestinal: Negative for abdominal pain, diarrhea, nausea and vomiting.  Neurological: Positive for light-headedness. Negative for syncope.  All other systems reviewed and are negative.  Physical Exam Updated Vital Signs BP 129/81 (BP Location: Right Arm)   Pulse 84   Temp 98.3 F (36.8 C) (Oral)   Resp 16   Ht 6\' 8"  (2.032 m)   Wt 129.3 kg   SpO2 92%   BMI 31.31 kg/m   Physical Exam Vitals and nursing note reviewed.  Constitutional:      General: He is not in acute distress.    Appearance: He is well-developed. He is not toxic-appearing.  HENT:     Head: Normocephalic and atraumatic.  Eyes:     General:        Right eye: No discharge.        Left eye: No discharge.     Conjunctiva/sclera: Conjunctivae normal.  Cardiovascular:     Rate and Rhythm: Tachycardia present. Rhythm irregularly irregular.  Pulmonary:     Effort: Pulmonary effort is normal.      Comments: Patient SPO2 92 to 94% on room air at rest.  Somewhat coarse breath sounds bibasilarly.  No obvious wheezing. Abdominal:     General: There is no distension.     Palpations: Abdomen is soft.     Tenderness: There is no abdominal tenderness.  Musculoskeletal:     Cervical back: Neck supple.     Right lower leg: No tenderness. Edema present.     Left lower leg: No tenderness. Edema present.     Comments: Trace symmetric pitting edema to the bilateral lower legs.  No calf tenderness to palpation.  Skin:    General: Skin is  warm and dry.     Findings: No rash.  Neurological:     Mental Status: He is alert.     Comments: Clear speech.   Psychiatric:        Behavior: Behavior normal.     ED Results / Procedures / Treatments   Labs (all labs ordered are listed, but only abnormal results are displayed) Labs Reviewed  COMPREHENSIVE METABOLIC PANEL - Abnormal; Notable for the following components:      Result Value   Sodium 134 (*)    Glucose, Bld 136 (*)    Calcium 8.7 (*)    Albumin 3.4 (*)    Total Bilirubin 1.9 (*)    All other components within normal limits  CBC WITH DIFFERENTIAL/PLATELET - Abnormal; Notable for the following components:   WBC 12.1 (*)    Neutro Abs 10.3 (*)    Abs Immature Granulocytes 0.19 (*)    All other components within normal limits  BRAIN NATRIURETIC PEPTIDE - Abnormal; Notable for the following components:   B Natriuretic Peptide 104.0 (*)    All other components within normal limits  SARS CORONAVIRUS 2 (TAT 6-24 HRS)  POC SARS CORONAVIRUS 2 AG -  ED    EKG EKG Interpretation  Date/Time:  Thursday May 07 2019 16:09:59 EST Ventricular Rate:  160 PR Interval:    QRS Duration: 100 QT Interval:  298 QTC Calculation: 486 R Axis:   -13 Text Interpretation: Atrial fibrillation with rapid ventricular response with premature ventricular or aberrantly conducted complexes Minimal voltage criteria for LVH, may be normal variant  Nonspecific ST abnormality Abnormal ECG Confirmed by Virgel Manifold [phone]) on 05/29/2019 7:45:12 PM   Radiology DG Chest 2 View  Result Date: 05/11/2019 CLINICAL DATA:  Shortness of breath. EXAM: CHEST - 2 VIEW COMPARISON:  January 26, 2017 FINDINGS: Mild infiltrates are seen within the mid left lung and bilateral lung bases, left greater than right. There is a mild amount of pleural fluid seen along the lateral aspect of the mid right lung. No pneumothorax is identified. The cardiac silhouette is mildly enlarged. The visualized skeletal structures are unremarkable. IMPRESSION: 1. Mild bilateral infiltrates. 2. Small lateral right pleural effusion. A small amount of loculated fluid cannot be excluded. Electronically Signed   By: Virgina Norfolk M.D.   On: 05/21/2019 17:02    Procedures .Critical Care Performed by: Amaryllis Dyke, PA-C Authorized by: Amaryllis Dyke, PA-C    CRITICAL CARE Performed by: Kennith Maes   Total critical care time: 35 minutes  Critical care time was exclusive of separately billable procedures and treating other patients.  Critical care was necessary to treat or prevent imminent or life-threatening deterioration.  Critical care was time spent personally by me on the following activities: development of treatment plan with patient and/or surrogate as well as nursing, discussions with consultants, evaluation of patient's response to treatment, examination of patient, obtaining history from patient or surrogate, ordering and performing treatments and interventions, ordering and review of laboratory studies, ordering and review of radiographic studies, pulse oximetry and re-evaluation of patient's condition.   (including critical care time)  Medications Ordered in ED Medications  diltiazem (CARDIZEM) 1 mg/mL load via infusion 10 mg (10 mg Intravenous Bolus from Bag 05/12/2019 1736)    And  diltiazem (CARDIZEM) 125 mg in dextrose 5% 125 mL (1  mg/mL) infusion (10 mg/hr Intravenous Rate/Dose Change 05/14/2019 1921)    ED Course  I have reviewed the triage vital signs and the nursing  notes.  Pertinent labs & imaging results that were available during my care of the patient were reviewed by me and considered in my medical decision making (see chart for details).  Aaron Sellers was evaluated in Emergency Department on 05/29/2019 for the symptoms described in the history of present illness. He/she was evaluated in the context of the global COVID-19 pandemic, which necessitated consideration that the patient might be at risk for infection with the SARS-CoV-2 virus that causes COVID-19. Institutional protocols and algorithms that pertain to the evaluation of patients at risk for COVID-19 are in a state of rapid change based on information released by regulatory bodies including the CDC and federal and state organizations. These policies and algorithms were followed during the patient's care in the ED.    MDM Rules/Calculators/A&P                      Patient presents to the emergency department with shortness of breath associated with palpitations and intermittent lightheadedness for the past 4 days.  Nontoxic, resting comfortably, vitals notable for heart rate in the 140s on my assessment.  Lungs with coarse breath sounds bibasilarly.  Trace symmetric pitting edema noted to the lower legs.  Will start Cardizem bolus and drip.  Check labs.  Chest x-ray per triage:  1. Mild bilateral infiltrates. 2. Small lateral right pleural effusion. A small amount of loculated fluid cannot be excluded.  CBC: Mild leukocytosis at 12.1.  No anemia. CMP: Mild hypocalcemia, mild hyponatremia, no significant electrolyte derangement.  Renal function preserved. BNP: minimal elevation Rapid Covid testing negative.  CXR: 1. Mild bilateral infiltrates. 2. Small lateral right pleural effusion. A small amount of loculated fluid cannot be excluded--> patient denies fever,  chills, or cough- will hold abx for now.   Per nursing patient becoming somewhat tachypneic therefore placed on 2L via [place] for increased comfort.   19:00: Heart rate in the 120s, will increase drip to 10 mg an hour  Consult placed to hospitalist service.  19:41: CONSULT: Discussed with hospitalist Dr. Darrick Meigs- accepts admission.   Findings and plan of care discussed with supervising physician Dr. Wilson Singer who is in agreement.   Final Clinical Impression(s) / ED Diagnoses Final diagnoses:  Atrial fibrillation with rapid ventricular response [facility])    Rx / DC Orders ED Discharge Orders    None       Leafy Kindle 05/04/2019 1945    Virgel Manifold, MD 05/08/19 1900

## 2019-05-07 NOTE — Telephone Encounter (Signed)
I told Aaron Sellers pt can be seen tomorrow by Cassie. Pt is at provider office now waiting to be seen by PCP . She will call back with update after the visit to see if he still needs to be seen tomorrow by Cassie.

## 2019-05-07 NOTE — ED Notes (Signed)
Patient POC covid kit with negative results.

## 2019-05-07 NOTE — Telephone Encounter (Addendum)
Pt is at  [facility]. Ronalee Belts  had a covid test and they want to run some cardiac tests in ED [place]. Holley Raring is going to ask the urgent care provider to send pt to [facility] where his cardiologist is located. She will call back with an update.

## 2019-05-07 NOTE — Telephone Encounter (Signed)
err

## 2019-05-07 NOTE — Telephone Encounter (Signed)
We may have to see him sooner. He can come tomorrow and see Cassie

## 2019-05-07 NOTE — ED Triage Notes (Signed)
Seen at [facility] is am for SOB.  Seen to ED for farther eval.  COVID, Flu A&B were negative today.Denies any pain. C/o SOB since Monday.

## 2019-05-08 ENCOUNTER — Encounter (HOSPITAL_COMMUNITY): Payer: Self-pay | Admitting: Family Medicine

## 2019-05-08 ENCOUNTER — Inpatient Hospital Stay (HOSPITAL_COMMUNITY): Payer: Medicare Other

## 2019-05-08 ENCOUNTER — Inpatient Hospital Stay: Payer: Self-pay

## 2019-05-08 DIAGNOSIS — J9621 Acute and chronic respiratory failure with hypoxia: Secondary | ICD-10-CM

## 2019-05-08 DIAGNOSIS — J9601 Acute respiratory failure with hypoxia: Secondary | ICD-10-CM

## 2019-05-08 DIAGNOSIS — I493 Ventricular premature depolarization: Secondary | ICD-10-CM

## 2019-05-08 LAB — COMPREHENSIVE METABOLIC PANEL
ALT: 40 U/L (ref 0–44)
AST: 38 U/L (ref 15–41)
Albumin: 3.2 g/dL — ABNORMAL LOW (ref 3.5–5.0)
Alkaline Phosphatase: 73 U/L (ref 38–126)
Anion gap: 15 (ref 5–15)
BUN: 17 mg/dL (ref 8–23)
CO2: 23 mmol/L (ref 22–32)
Calcium: 8.8 mg/dL — ABNORMAL LOW (ref 8.9–10.3)
Chloride: 96 mmol/L — ABNORMAL LOW (ref 98–111)
Creatinine, Ser: 1.01 mg/dL (ref 0.61–1.24)
GFR calc Af Amer: >60 mL/min (ref >60)
GFR calc non Af Amer: >60 mL/min (ref >60)
Glucose, Bld: 147 mg/dL — ABNORMAL HIGH (ref 70–99)
Potassium: 4.8 mmol/L (ref 3.5–5.1)
Sodium: 134 mmol/L — ABNORMAL LOW (ref 135–145)
Total Bilirubin: 2.9 mg/dL — ABNORMAL HIGH (ref 0.3–1.2)
Total Protein: 7.3 g/dL (ref 6.5–8.1)

## 2019-05-08 LAB — GLUCOSE, CAPILLARY
Glucose-Capillary: 145 mg/dL — ABNORMAL HIGH (ref 70–99)
Glucose-Capillary: 243 mg/dL — ABNORMAL HIGH (ref 70–99)
Glucose-Capillary: 196 mg/dL — ABNORMAL HIGH (ref 70–99)

## 2019-05-08 LAB — HIV ANTIBODY (ROUTINE TESTING W REFLEX): HIV Screen 4th Generation wRfx: NONREACTIVE

## 2019-05-08 LAB — MRSA PCR SCREENING: MRSA by PCR: NEGATIVE

## 2019-05-08 LAB — D-DIMER, QUANTITATIVE: D-Dimer, Quant: 2.11 ug/mL-FEU — ABNORMAL HIGH (ref 0.00–0.50)

## 2019-05-08 LAB — CBC
HCT: 48.7 % (ref 39.0–52.0)
Hemoglobin: 15.7 g/dL (ref 13.0–17.0)
MCH: 28.1 pg (ref 26.0–34.0)
MCHC: 32.2 g/dL (ref 30.0–36.0)
MCV: 87.3 fL (ref 80.0–100.0)
Platelets: 211 10*3/uL (ref 150–400)
RBC: 5.58 MIL/uL (ref 4.22–5.81)
RDW: 13.7 % (ref 11.5–15.5)
WBC: 12.5 10*3/uL — ABNORMAL HIGH (ref 4.0–10.5)
nRBC: 0 % (ref 0.0–0.2)

## 2019-05-08 LAB — PROCALCITONIN: Procalcitonin: 0.29 ng/mL

## 2019-05-08 LAB — SARS CORONAVIRUS 2 (TAT 6-24 HRS): SARS Coronavirus 2: NEGATIVE

## 2019-05-08 LAB — MAGNESIUM: Magnesium: 2 mg/dL (ref 1.7–2.4)

## 2019-05-08 LAB — C-REACTIVE PROTEIN: CRP: 22.8 mg/dL — ABNORMAL HIGH (ref <1.0)

## 2019-05-08 LAB — TSH: TSH: 1.828 u[IU]/mL (ref 0.350–4.500)

## 2019-05-08 LAB — HEMOGLOBIN A1C
Hgb A1c MFr Bld: 7 % — ABNORMAL HIGH (ref 4.8–5.6)
Mean Plasma Glucose: 154.2 mg/dL

## 2019-05-08 MED ORDER — SODIUM CHLORIDE 0.9 % IV SOLN
2.0000 g | INTRAVENOUS | Status: DC
  Administered 2019-05-08: 16:00:00 2 g via INTRAVENOUS
  Filled 2019-05-08: qty 20

## 2019-05-08 MED ORDER — AMIODARONE LOAD VIA INFUSION
150.0000 mg | Freq: Once | INTRAVENOUS | Status: AC
  Administered 2019-05-08: 150 mg via INTRAVENOUS
  Filled 2019-05-08: qty 83.34

## 2019-05-08 MED ORDER — SODIUM CHLORIDE 0.9 % IV SOLN
500.0000 mg | INTRAVENOUS | Status: DC
  Administered 2019-05-08 – 2019-05-14 (×7): 500 mg via INTRAVENOUS
  Filled 2019-05-08 (×7): qty 500

## 2019-05-08 MED ORDER — VANCOMYCIN HCL 2000 MG/400ML IV SOLN
2000.0000 mg | Freq: Once | INTRAVENOUS | Status: AC
  Administered 2019-05-08: 2000 mg via INTRAVENOUS
  Filled 2019-05-08: qty 400

## 2019-05-08 MED ORDER — FUROSEMIDE 10 MG/ML IJ SOLN
40.0000 mg | Freq: Once | INTRAMUSCULAR | Status: AC
  Administered 2019-05-08: 40 mg via INTRAVENOUS
  Filled 2019-05-08: qty 4

## 2019-05-08 MED ORDER — CHLORHEXIDINE GLUCONATE CLOTH 2 % EX PADS
6.0000 | MEDICATED_PAD | Freq: Every day | CUTANEOUS | Status: DC
  Administered 2019-05-08 – 2019-05-15 (×8): 6 via TOPICAL

## 2019-05-08 MED ORDER — INSULIN ASPART 100 UNIT/ML ~~LOC~~ SOLN
0.0000 [IU] | Freq: Every day | SUBCUTANEOUS | Status: DC
  Administered 2019-05-09 – 2019-05-11 (×3): 2 [IU] via SUBCUTANEOUS

## 2019-05-08 MED ORDER — VANCOMYCIN HCL 1250 MG/250ML IV SOLN
1250.0000 mg | Freq: Two times a day (BID) | INTRAVENOUS | Status: DC
  Administered 2019-05-09: 1250 mg via INTRAVENOUS
  Filled 2019-05-08: qty 250

## 2019-05-08 MED ORDER — AMIODARONE HCL IN DEXTROSE 360-4.14 MG/200ML-% IV SOLN
60.0000 mg/h | INTRAVENOUS | Status: AC
  Administered 2019-05-08 (×2): 60 mg/h via INTRAVENOUS
  Filled 2019-05-08 (×2): qty 200

## 2019-05-08 MED ORDER — METHYLPREDNISOLONE SODIUM SUCC 125 MG IJ SOLR
60.0000 mg | Freq: Three times a day (TID) | INTRAMUSCULAR | Status: DC
  Administered 2019-05-08 – 2019-05-09 (×4): 60 mg via INTRAVENOUS
  Filled 2019-05-08 (×4): qty 2

## 2019-05-08 MED ORDER — AMIODARONE HCL IN DEXTROSE 360-4.14 MG/200ML-% IV SOLN
30.0000 mg/h | INTRAVENOUS | Status: DC
  Administered 2019-05-09 – 2019-05-11 (×5): 30 mg/h via INTRAVENOUS
  Filled 2019-05-08 (×6): qty 200

## 2019-05-08 MED ORDER — PANTOPRAZOLE SODIUM 40 MG PO TBEC
40.0000 mg | DELAYED_RELEASE_TABLET | Freq: Every day | ORAL | Status: DC
  Administered 2019-05-08 – 2019-05-09 (×2): 40 mg via ORAL
  Filled 2019-05-08 (×3): qty 1

## 2019-05-08 MED ORDER — INSULIN ASPART 100 UNIT/ML ~~LOC~~ SOLN
0.0000 [IU] | Freq: Three times a day (TID) | SUBCUTANEOUS | Status: DC
  Administered 2019-05-08: 3 [IU] via SUBCUTANEOUS
  Administered 2019-05-09: 16:00:00 5 [IU] via SUBCUTANEOUS
  Administered 2019-05-09: 07:00:00 2 [IU] via SUBCUTANEOUS
  Administered 2019-05-09: 11:00:00 3 [IU] via SUBCUTANEOUS
  Administered 2019-05-10 (×2): 2 [IU] via SUBCUTANEOUS
  Administered 2019-05-10: 3 [IU] via SUBCUTANEOUS
  Administered 2019-05-11: 5 [IU] via SUBCUTANEOUS
  Administered 2019-05-11: 3 [IU] via SUBCUTANEOUS
  Administered 2019-05-11: 2 [IU] via SUBCUTANEOUS
  Administered 2019-05-12: 12:00:00 3 [IU] via SUBCUTANEOUS
  Administered 2019-05-12: 2 [IU] via SUBCUTANEOUS
  Administered 2019-05-12 – 2019-05-13 (×2): 3 [IU] via SUBCUTANEOUS
  Administered 2019-05-13: 2 [IU] via SUBCUTANEOUS
  Administered 2019-05-13 – 2019-05-14 (×3): 3 [IU] via SUBCUTANEOUS
  Administered 2019-05-14: 5 [IU] via SUBCUTANEOUS
  Administered 2019-05-15 (×2): 2 [IU] via SUBCUTANEOUS

## 2019-05-08 MED ORDER — SODIUM CHLORIDE 0.9 % IV SOLN
2.0000 g | Freq: Three times a day (TID) | INTRAVENOUS | Status: DC
  Administered 2019-05-08 – 2019-05-15 (×21): 2 g via INTRAVENOUS
  Filled 2019-05-08 (×21): qty 2

## 2019-05-08 MED ORDER — IOHEXOL 350 MG/ML SOLN
100.0000 mL | Freq: Once | INTRAVENOUS | Status: AC | PRN
  Administered 2019-05-08: 100 mL via INTRAVENOUS

## 2019-05-08 NOTE — Progress Notes (Signed)
Spoke with Lilia Pro RN re: PICC order, cannot be placed by IV/PICC Team. Notified also that IV Team doesn't provide service over the weekend.

## 2019-05-08 NOTE — Care Management Important Message (Signed)
Important Message  Patient Details  Name: Aaron Sellers MRN: [id number] Date of Birth: [birth year]   Medicare Important Message Given:  Yes     Tommy Medal 05/08/2019, 4:35 PM

## 2019-05-08 NOTE — Progress Notes (Signed)
Pharmacy Antibiotic Note  Aaron Sellers is a 69 y.o. male admitted on 05/19/2019 with pneumonia.  Pharmacy has been consulted for Vancomycin and Cefepime dosing.  Plan: Vancomycin 2000 mg IV x 1 dose. Vancomycin 1250 mg IV every 12 hours.  Goal trough 15-20 mcg/mL.  Cefepime 2000 mg IV every 8 hours. Monitor labs, c/s, and vanco level as indicated.  Height: 6\' 8"  (203.2 cm) Weight: 271 lb 13.2 oz (123.3 kg) IBW/kg (Calculated) : 96  Temp (24hrs), Avg:99.1 F (37.3 C), Min:98.9 F (37.2 C), Max:99.3 F (37.4 C)  Recent Labs  Lab 05/26/2019 1719 05/08/19 0402  WBC 12.1* 12.5*  CREATININE 0.81 1.01    Estimated Creatinine Clearance: 108.8 mL/min (by C-G formula based on SCr of 1.01 mg/dL).    Allergies  Allergen Reactions  . Ciprofloxacin     HIVES  . Adhesive [Tape] Other (See Comments)    Skin peels  . Codeine Other (See Comments)    Jitters, "skin crawling"    Antimicrobials this admission: Vanco 2/5 >>  Cefepime 2/5 >>  Azith 2/5 >> CTX 2/5 >> 2/5  Dose adjustments this admission: N/A  Microbiology results: 2/5 MRSA PCR: negative  Thank you for allowing pharmacy to be a part of this patient's care.  Margot Ables, PharmD Clinical Pharmacist 05/08/2019 5:13 PM

## 2019-05-08 NOTE — Consult Note (Addendum)
Cardiology Consult    Patient ID: Aaron Sellers; [id number]; 07-Jan-1951   Admit date: 05/20/2019 Date of Consult: 05/08/2019  Primary Care Provider: Sheryle Hail, PA-C Primary Cardiologist: Carlyle Dolly, MD   Patient Profile    Aaron Sellers is a 69 y.o. male with past medical history of paroxysmal atrial flutter (not on anticoagulation given lung cancer and history of perforated bowel), HLD and Stage 4 Lung Cancer who is being seen today for the evaluation of atrial fibrillation with RVR at the request of Dr. Harl Bowie.   History of Present Illness    [name] was last examined by Katina Dung, NP in 03/2019 and denied any recent chest pain or palpitations at that time. He did have mild lower extremity edema which had overall been stable. Was in NSR by examination and EKG. He was continued on Cardizem CD 180 mg daily and as needed Lasix 40 mg daily.  He has been followed by Oncology and had undergone 35 cycles of maintenance therapy with [place]. At the time of his Oncology visit in 03/2019, there was concern for pneumonitis and he was started on high-dose steroids.   He presented to [facility] ED on 05/18/2019 for evaluation of worsening dyspnea over the past several days. He had been evaluated at Urgent Care earlier in the day and was negative for flu and COVID-19. In talking with the patient today, he reports having worsening dyspnea on exertion for the past 3 to 4 days but denies any associated chest pain or palpitations. He says he does have occasional palpitations but has overall been unaware of his arrhythmia.  He denies any recent orthopnea, PND or lower extremity edema. He does not require supplemental oxygen at home.  Initial labs showed WBC 12.1, Hgb 16.0, platelets 229, Na+ 134, K+ 3.8 and creatinine 0.81. AST 31 and ALT 38. BNP 104. COVID negative.  CXR showed mild bilateral infiltrates and a small lateral right pleural effusion. EKG showed atrial fibrillation with RVR, heart rate  160 with PVCs.  He was started on IV Cardizem, currently at 15 mg/hr and rates have remained elevated in the low 100's to 120's. Also started on Lopressor 50mg  BID by the admitting team. Rates increase into the 130's while talking during the encounter.  He is also having desaturations into the 80's.  Past Medical History:  Diagnosis Date  . Arthritis   . Atrial flutter ([place])   . BPH (benign prostatic hypertrophy) with urinary retention   . Cancer ([place])   . Chronic kidney disease    hx of kidney stones  2011  . COPD (chronic obstructive pulmonary disease) ([place])    has been smoking since he was 22 ish--  . High cholesterol   . Hypertension    dx around 2011  . Squamous cell carcinoma of right lung [facility])     Past Surgical History:  Procedure Laterality Date  . HERNIA REPAIR     umbilical hernia  07/1698  . INCISION AND DRAINAGE ABSCESS     "down the middle" of his buttocks  2015  . TOTAL KNEE ARTHROPLASTY Left 10/29/2014   Procedure: TOTAL KNEE ARTHROPLASTY;  Surgeon: Dorna Leitz, MD;  Location: [facility];  Service: Orthopedics;  Laterality: Left;     Home Medications:  Prior to Admission medications   Medication Sig Start Date End Date Taking? Authorizing Provider  acetaminophen (TYLENOL) 500 MG tablet Take 1,000 mg by mouth every 6 (six) hours as needed for moderate pain.  Yes [provider]  atorvastatin (LIPITOR) 10 MG tablet Take 10 mg by mouth every evening.    Yes [provider]  CARTIA XT 180 MG 24 hr capsule Take 1 capsule by mouth once daily Patient taking differently: Take 180 mg by mouth daily.  02/23/19  Yes BranchAlphonse Guild, MD  Cyanocobalamin (VITAMIN B-12 PO) Take 1 tablet by mouth daily.   Yes [provider]  famotidine (PEPCID) 10 MG tablet Take 10 mg by mouth as needed for heartburn or indigestion.   Yes [provider]  furosemide (LASIX) 40 MG tablet Take 1 tablet (40 mg total) by mouth daily as needed. 10/17/17 03/18/20 Yes  Branch, Alphonse Guild, MD  gabapentin (NEURONTIN) 300 MG capsule TAKE 1 CAPSULE BY MOUTH THREE TIMES DAILY Patient taking differently: Take 300 mg by mouth 2 (two) times daily.  04/14/19  Yes Curt Bears, MD  loratadine (CLARITIN) 10 MG tablet Take 10 mg by mouth daily as needed for allergies.    Yes [provider]  tamsulosin (FLOMAX) 0.4 MG CAPS capsule Take 0.4 mg by mouth daily.   Yes [provider]  terbinafine ([employer] ATHLETES FOOT) 1 % cream Apply 1 application topically daily.   Yes [provider]  predniSONE (DELTASONE) 20 MG tablet Take 6 tablets daily for 2 weeks, then take 5 tablets daily for 1 week, then take 4 tablets for 1 week, then take 3 tablets for 1 week, then take 2 tablet for 1 week, then take 1 tablet for 1 week, then take 0.5 tablets for 1 week. Patient not taking: Reported on 05/23/2019 04/01/19   Heilingoetter, Cassandra L, PA-C    Inpatient Medications: Scheduled Meds: . atorvastatin  10 mg Oral QPM  . Chlorhexidine Gluconate Cloth  6 each Topical Daily  . enoxaparin (LOVENOX) injection  40 mg Subcutaneous QHS  . gabapentin  300 mg Oral BID  . methylPREDNISolone (SOLU-MEDROL) injection  60 mg Intravenous Q8H  . pantoprazole  40 mg Oral Daily  . sodium chloride flush  3 mL Intravenous Q12H  . tamsulosin  0.4 mg Oral Daily   Continuous Infusions: . sodium chloride    . diltiazem (CARDIZEM) infusion 15 mg/hr (05/08/19 1056)   PRN Meds: sodium chloride, acetaminophen, ondansetron **OR** ondansetron (ZOFRAN) IV, sodium chloride flush  Allergies:    Allergies  Allergen Reactions  . Ciprofloxacin     HIVES  . Adhesive [Tape] Other (See Comments)    Skin peels  . Codeine Other (See Comments)    Jitters, "skin crawling"    Social History:   Social History   Socioeconomic History  . Marital status: Significant Other    Spouse name: Not on file  . Number of children: Not on file  . Years of education: Not on file  . Highest  education level: Not on file  Occupational History  . Not on file  Tobacco Use  . Smoking status: Former Smoker    Packs/day: 1.00    Years: 40.00    Pack years: 40.00    Types: Cigarettes    Quit date: 01/27/2017    Years since quitting: 2.2  . Smokeless tobacco: Former Systems developer    Types: Snuff  Substance and Sexual Activity  . Alcohol use: No  . Drug use: No  . Sexual activity: Not Currently  Other Topics Concern  . Not on file  Social History Narrative   Per pt long time gf of 30+ years [name] is HPOA.  Social Determinants of Health   Financial Resource Strain:   . Difficulty of Paying Living Expenses: Not on file  Food Insecurity:   . Worried About Charity fundraiser in the Last Year: Not on file  . Ran Out of Food in the Last Year: Not on file  Transportation Needs:   . Lack of Transportation (Medical): Not on file  . Lack of Transportation (Non-Medical): Not on file  Physical Activity:   . Days of Exercise per Week: Not on file  . Minutes of Exercise per Session: Not on file  Stress:   . Feeling of Stress : Not on file  Social Connections:   . Frequency of Communication with Friends and Family: Not on file  . Frequency of Social Gatherings with Friends and Family: Not on file  . Attends Religious Services: Not on file  . Active Member of Clubs or Organizations: Not on file  . Attends Archivist Meetings: Not on file  . Marital Status: Not on file  Intimate Partner Violence:   . Fear of Current or Ex-Partner: Not on file  . Emotionally Abused: Not on file  . Physically Abused: Not on file  . Sexually Abused: Not on file     Family History:    Family History  Problem Relation Age of Onset  . Hypertension Mother   . CVA Father   . Lung cancer Sister   . Lung cancer Brother   . Hypertension Brother   . Hypertension Sister       Review of Systems    General:  No chills, fever, night sweats or weight changes.  Cardiovascular:  No chest  pain, orthopnea, palpitations, paroxysmal nocturnal dyspnea. Positive for dyspnea on exertion and edema.  Dermatological: No rash, lesions/masses Respiratory: No cough, dyspnea Urologic: No hematuria, dysuria Abdominal:   No nausea, vomiting, diarrhea, bright red blood per rectum, melena, or hematemesis Neurologic:  No visual changes, wkns, changes in mental status. All other systems reviewed and are otherwise negative except as noted above.  Physical Exam/Data    Vitals:   05/08/19 0800 05/08/19 0900 05/08/19 1003 05/08/19 1138  BP: 113/81 115/72 104/76   Pulse: (!) 114 (!) 106 95   Resp: (!) 33 (!) 33 (!) 32   Temp: 98.9 F (37.2 C)     TempSrc: Oral     SpO2: 90% 91%  (!) 89%  Weight:      Height:        Intake/Output Summary (Last 24 hours) at 05/08/2019 1148 Last data filed at 05/08/2019 0800 Gross per 24 hour  Intake 203.26 ml  Output 630 ml  Net -426.74 ml   Filed Weights   05/24/2019 1604 05/08/19 0400  Weight: 129.3 kg 123.3 kg   Body mass index is 29.86 kg/m.   General: Pleasant, Caucasian male appearing in NAD Psych: Normal affect. Neuro: Alert and oriented X 3. Moves all extremities spontaneously. HEENT: Normal  Neck: Supple without bruits or JVD. Lungs:  Resp regular and unlabored, rhonchi throughout. Heart: Irregularly irregular, tachycardiac. no s3, s4, or murmurs. Abdomen: Soft, non-tender, non-distended, BS + x 4.  Extremities: No clubbing or cyanosis. 1+ pitting edema. DP/PT/Radials 2+ and equal bilaterally.   EKG:  The EKG was personally reviewed and demonstrates: Atrial fibrillation with RVR, heart rate 160 with PVC's.  Telemetry:  Telemetry was personally reviewed and demonstrates: Atrial fibrillation, HR in low-100's to 120's. Frequent PVC's.    Labs/Studies     Relevant CV Studies:  Echocardiogram: 05/2017 Study Conclusions   - Left ventricle: The cavity size was mildly dilated. Wall  thickness was increased in a pattern of mild LVH.  Systolic  function was normal. The estimated ejection fraction was in the  range of 55% to 60%. Wall motion was normal; there were no  regional wall motion abnormalities. The study is not technically  sufficient to allow evaluation of LV diastolic function.  - Mitral valve: There was mild regurgitation.  - Right ventricle: The cavity size was mildly dilated.  - Right atrium: The atrium was mildly dilated.   Impressions:   - Normal LV systolic function; mild LVH and LVE; mild MR; mild RAE  and RVE; mild TR.   Laboratory Data:  Chemistry Recent Labs  Lab 05/05/2019 1719 05/08/19 0402  NA 134* 134*  K 3.8 4.8  CL 98 96*  CO2 24 23  GLUCOSE 136* 147*  BUN 17 17  CREATININE 0.81 1.01  CALCIUM 8.7* 8.8*  GFRNONAA >60 >60  GFRAA >60 >60  ANIONGAP 12 15    Recent Labs  Lab 05/18/2019 1719 05/08/19 0402  PROT 7.1 7.3  ALBUMIN 3.4* 3.2*  AST 31 38  ALT 38 40  ALKPHOS 85 73  BILITOT 1.9* 2.9*   Hematology Recent Labs  Lab 05/23/2019 1719 05/08/19 0402  WBC 12.1* 12.5*  RBC 5.60 5.58  HGB 16.0 15.7  HCT 48.5 48.7  MCV 86.6 87.3  MCH 28.6 28.1  MCHC 33.0 32.2  RDW 13.4 13.7  PLT 229 211   Cardiac EnzymesNo results for input(s): TROPONINI in the last 168 hours. No results for input(s): TROPIPOC in the last 168 hours.  BNP Recent Labs  Lab 05/06/2019 1720  BNP 104.0*    DDimer No results for input(s): DDIMER in the last 168 hours.  Radiology/Studies:  DG Chest 2 View  Result Date: 05/13/2019 CLINICAL DATA:  Shortness of breath. EXAM: CHEST - 2 VIEW COMPARISON:  January 26, 2017 FINDINGS: Mild infiltrates are seen within the mid left lung and bilateral lung bases, left greater than right. There is a mild amount of pleural fluid seen along the lateral aspect of the mid right lung. No pneumothorax is identified. The cardiac silhouette is mildly enlarged. The visualized skeletal structures are unremarkable. IMPRESSION: 1. Mild bilateral infiltrates. 2. Small  lateral right pleural effusion. A small amount of loculated fluid cannot be excluded. Electronically Signed   By: Virgina Norfolk M.D.   On: 05/12/2019 17:02     Assessment & Plan    1. Atrial Fibrillation with RVR - He has a history of paroxysmal atrial flutter which was diagnosed in the past in the setting of an acute illness and presents this time with atrial fibrillation with RVR. Some telemetry strips appear more consistent with flutter but difficult to tell given his elevated rates. - By review of prior notes, he had been in atrial flutter with RVR in 05/2017 and BP limited the use of AV nodal blocking agents and he required IV Amiodarone. He did convert to NSR at that time and was transitioned to PO Amiodarone as an outpatient and eventually discontinued in 03/2018. - He is maxed out on IV Cardizem at 15 mg/hr and was started on Lopressor 50 mg twice daily by the admitting team but rates remain elevated in the 120's at rest. SBP is soft in the low 100's. Will review with Dr. Bronson Ing in regards to starting IV Amiodarone. Not a candidate for DCCV at this time given no prior  anticoagulation.  - This patients CHA2DS2-VASc Score and unadjusted Ischemic Stroke Rate (% per year) is equal to 2.2 % stroke rate/year from a score of 2 (Coronary Calcifications by CT, Age). Not on anticoagulation previously given his history of bowel perforation, known Stage 4 Lung Cancer and previous CHA2DS2-VASc Score of 1.   2. Acute Hypoxia - Oxygen saturations are currently in the 80's on 5L [place]. BNP was 104 on admission. CXR showed mild bilateral infiltrates and a small lateral right pleural effusion. - He did receive a dose of IV Lasix 40 mg on admission but is not on standing diuretic therapy at this time. Will order a repeat portable CXR.   3. Frequent Ectopy - He is having frequent PVC's by review of telemetry. Denies any chest pain or palpitations. K+ 4.8. Will check Mg. Would plan for a repeat  echocardiogram to assess LV function and wall motion once rates improve.   For questions or updates, please contact [place] Please consult [email or web address] for contact info under Cardiology/STEMI.  Signed, Erma Heritage, PA-C 05/08/2019, 11:48 AM Pager: [phone]  The patient was seen and examined, and I agree with the history, physical exam, assessment and plan as documented above, with modifications as noted below. I have also personally reviewed all relevant documentation, old records, labs, and both radiographic and cardiovascular studies. I have also independently interpreted old and new ECG's.  Briefly, this is a 69 year old male with a history of stage IV lung cancer and paroxysmal atrial flutter.  He takes Cardizem CD 180 mg daily.  He has not on anticoagulation given his history of lung cancer and history of perforated bowel.  He told me he has been having progressive shortness of breath since Monday of this week.  He denies associated chest pain and palpitations.  He denies fevers and chills.  Upon presentation to the [facility] ED he was found to be in rapid atrial fibrillation.  I personally reviewed both ECGs performed yesterday which showed rapid atrial fibrillation with PVCs.  He is negative for COVID-19.  Initial chest x-ray yesterday showed mild bilateral infiltrates and a follow-up chest x-ray today suggests possible multi lobar pneumonia.  He does not use oxygen at home and is currently using 5 L with O2 saturations in the 80% range.  Heart rates are in the 110-120 bpm range on IV Cardizem 15 mg/h.  He initially received oral Lopressor but this has since been discontinued.  CT angiography of the chest has been ordered by the primary team.  Recommendations: Tachyarrhythmia likely precipitated by possible multilobar pneumonia.  As this is treated with antimicrobial therapy I suspect he may revert back to sinus rhythm.  He had been in sinus rhythm at his last  cardiology office visit.  I will discontinue IV diltiazem and start IV amiodarone.  He did require this during a hospitalization in March 2019 which led to conversion of rapid atrial flutter to sinus rhythm.  As stated above, he is not a candidate for systemic anticoagulation.  Once heart rates are controlled I would recommend obtaining a follow-up echocardiogram to assess for interval changes in cardiac structure and function.   Kate Sable, MD, [facility]  05/08/2019 12:27 PM

## 2019-05-08 NOTE — Progress Notes (Signed)
PROGRESS NOTE                                                                                                                                                                                                             Patient Demographics:    Aaron Sellers, is a 69 y.o. male, DOB - October 22, 1950, PYK:[id number]  Admit date - 05/20/2019   Admitting Physician Oswald Hillock, MD  Outpatient Primary MD for the patient is Janece Canterbury  LOS - 1   Chief Complaint  Patient presents with  . Shortness of Breath       Brief Narrative    69 y.o. 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  Patient states that he has been having shortness of breath with palpitations and at times was lightheaded.  Denies any chest pain. He was found to be in A. fib with RVR, admitted to stepdown for heart rate control on Cardizem drip, as well patient had progressive hypoxia within first 24 hours on admission, which has escalated rapidly, he D-dimers were elevated, CTA chest with no evidence of PE, but significant for severe interstitial lung disease.    Subjective:    Aaron Sellers today reports dyspnea, cough, denies any chest pain .     Assessment  & Plan :    Active Problems:   Atrial fibrillation with RVR (HCC)  Acute hypoxic respiratory failure. -Patient currently with significant oxygen requirement, he is on 12 L high flow nasal cannula. -Multiple differential diagnoses, possibly related to infectious etiology, versus inflammatory etiology. -Discussed with PCCM, will broaden antibiotic coverage, add vancomycin, cefepime to azithromycin. -No improvement, likely  will need bronchoscopy at one point. -Patient with Keytruda pneumonitis, he has been on steroid taper, monitor etiology is possible as well, discussed with Dr.Zhao from oncology, should respond to steroids in couple days if related to Keytruda pneumonitis. -No significant evidence of volume overload on physical exam, but will diurese gently as blood pressure allows.   COVID-19 Labs  Recent Labs    05/08/19 1159  DDIMER 2.11*  CRP 22.8*  Lab Results  Component Value Date   SARSCOV2NAA NEGATIVE 05/29/2019   A. fib with RVR -Management per cardiology, Cardizem drip transition to amiodarone drip. -Not on anticoagulation previously given his history of bowel perforation, known Stage 4 Lung Cancer and previous CHA2DS2-VASc Score of 1.   Stage IV squamous cell carcinoma - patient followed by oncology as outpatient.   Code Status : Full(reports he is okay with intubation for short period of time, but does not want to be kept alive on life support)  Family Communication  : Discussed with patient significant other with his permission, they have been partner for more than 30 years  Disposition Plan  : Remains ICU  Barriers For Discharge :  on amiodarone drip, IV steroids, IV antibiotics and significant oxygen requirement  Consults  : Cardiology, discussed with oncology Dr Maylon Peppers, and PCCM DR Halford Chessman  Procedures  :   DVT Prophylaxis  :    Lab Results  Component Value Date   PLT 211 05/08/2019    Antibiotics  :    Anti-infectives (From admission, onward)   Start     Dose/Rate Route Frequency Ordered Stop   05/08/19 1445  azithromycin (ZITHROMAX) 500 mg in sodium chloride 0.9 % 250 mL IVPB     500 mg 250 mL/hr over 60 Minutes Intravenous Every 24 hours 05/08/19 1440     05/08/19 1445  cefTRIAXone (ROCEPHIN) 2 g in sodium chloride 0.9 % 100 mL IVPB     2 g 200 mL/hr over 30 Minutes Intravenous Every 24 hours 05/08/19 1440          Objective:   Vitals:   05/08/19 1620  05/08/19 1625 05/08/19 1630 05/08/19 1700  BP: 116/90  (!) 114/97 119/83  Pulse: (!) 121 (!) 114 (!) 110 (!) 116  Resp: (!) 44 (!) 33 (!) 42 (!) 38  Temp:      TempSrc:      SpO2: (!) 84% 90% (!) 88% 90%  Weight:      Height:        Wt Readings from Last 3 Encounters:  05/08/19 123.3 kg  04/01/19 129.5 kg  03/19/19 129.2 kg     Intake/Output Summary (Last 24 hours) at 05/08/2019 1703 Last data filed at 05/08/2019 1700 Gross per 24 hour  Intake 680.72 ml  Output 1530 ml  Net -849.28 ml     Physical Exam  Awake Alert, Oriented X 3, No new F.N deficits, Normal affect Symmetrical Chest wall movement, diffuse bilateral rales Irr Irr ,No Gallops,Rubs or new Murmurs, No Parasternal Heave +ve B.Sounds, Abd Soft, No tenderness, No rebound - guarding or rigidity. No Cyanosis, Clubbing or edema, No new Rash or bruise      Data Review:    CBC Recent Labs  Lab 05/26/2019 1719 05/08/19 0402  WBC 12.1* 12.5*  HGB 16.0 15.7  HCT 48.5 48.7  PLT 229 211  MCV 86.6 87.3  MCH 28.6 28.1  MCHC 33.0 32.2  RDW 13.4 13.7  LYMPHSABS 1.1  --   MONOABS 0.4  --   EOSABS 0.0  --   BASOSABS 0.0  --     Chemistries  Recent Labs  Lab 05/14/2019 1719 05/08/19 0402 05/08/19 1159  NA 134* 134*  --   K 3.8 4.8  --   CL 98 96*  --   CO2 24 23  --   GLUCOSE 136* 147*  --   BUN 17 17  --   CREATININE 0.81 1.01  --  CALCIUM 8.7* 8.8*  --   MG  --   --  2.0  AST 31 38  --   ALT 38 40  --   ALKPHOS 85 73  --   BILITOT 1.9* 2.9*  --    ------------------------------------------------------------------------------------------------------------------ No results for input(s): CHOL, HDL, LDLCALC, TRIG, CHOLHDL, LDLDIRECT in the last 72 hours.  No results found for: HGBA1C ------------------------------------------------------------------------------------------------------------------ Recent Labs    05/08/19 1159  TSH 1.828    ------------------------------------------------------------------------------------------------------------------ No results for input(s): VITAMINB12, FOLATE, FERRITIN, TIBC, IRON, RETICCTPCT in the last 72 hours.  Coagulation profile No results for input(s): INR, PROTIME in the last 168 hours.  Recent Labs    05/08/19 1159  DDIMER 2.11*    Cardiac Enzymes No results for input(s): CKMB, TROPONINI, MYOGLOBIN in the last 168 hours.  Invalid input(s): CK ------------------------------------------------------------------------------------------------------------------    Component Value Date/Time   BNP 104.0 (H) 05/19/2019 1720    Inpatient Medications  Scheduled Meds: . atorvastatin  10 mg Oral QPM  . Chlorhexidine Gluconate Cloth  6 each Topical Daily  . enoxaparin (LOVENOX) injection  40 mg Subcutaneous QHS  . gabapentin  300 mg Oral BID  . insulin aspart  0-5 Units Subcutaneous QHS  . [START ON 05/09/2019] insulin aspart  0-9 Units Subcutaneous TID WC  . methylPREDNISolone (SOLU-MEDROL) injection  60 mg Intravenous Q8H  . pantoprazole  40 mg Oral Daily  . sodium chloride flush  3 mL Intravenous Q12H  . tamsulosin  0.4 mg Oral Daily   Continuous Infusions: . sodium chloride    . amiodarone 60 mg/hr (05/08/19 1700)   Followed by  . amiodarone    . azithromycin 250 mL/hr at 05/08/19 1700  . cefTRIAXone (ROCEPHIN)  IV Stopped (05/08/19 1644)   PRN Meds:.sodium chloride, acetaminophen, ondansetron **OR** ondansetron (ZOFRAN) IV, sodium chloride flush  Micro Results Recent Results (from the past 240 hour(s))  SARS CORONAVIRUS 2 (TAT 6-24 HRS) Nasopharyngeal Nasopharyngeal Swab     Status: None   Collection Time: 05/24/2019  7:36 PM   Specimen: Nasopharyngeal Swab  Result Value Ref Range Status   SARS Coronavirus 2 NEGATIVE NEGATIVE Final    Comment: (NOTE) SARS-CoV-2 target nucleic acids are NOT DETECTED. The SARS-CoV-2 RNA is generally detectable in upper and  lower respiratory specimens during the acute phase of infection. Negative results do not preclude SARS-CoV-2 infection, do not rule out co-infections with other pathogens, and should not be used as the sole basis for treatment or other patient management decisions. Negative results must be combined with clinical observations, patient history, and epidemiological information. The expected result is Negative. Fact Sheet for Patients: SugarRoll.be Fact Sheet for Healthcare Providers: https://www.woods-mathews.com/ This test is not yet approved or cleared by the [place] FDA and  has been authorized for detection and/or diagnosis of SARS-CoV-2 by FDA under an Emergency Use Authorization (EUA). This EUA will remain  in effect (meaning this test can be used) for the duration of the COVID-19 declaration under Section 56 4(b)(1) of the Act, 21 U.S.C. section 360bbb-3(b)(1), unless the authorization is terminated or revoked sooner. Performed at [facility], [place] [address] [place], [place] [address]   MRSA PCR Screening     Status: None   Collection Time: 05/08/19  3:08 AM   Specimen: Nasal Mucosa; Nasopharyngeal  Result Value Ref Range Status   MRSA by PCR NEGATIVE NEGATIVE Final    Comment:        The GeneXpert MRSA Assay (FDA approved for NASAL  specimens only), is one component of a comprehensive MRSA colonization surveillance program. It is not intended to diagnose MRSA infection nor to guide or monitor treatment for MRSA infections. Performed at [facility], [address], [place], [place] [address]     Radiology Reports DG Chest 2 View  Result Date: 05/18/2019 CLINICAL DATA:  Shortness of breath. EXAM: CHEST - 2 VIEW COMPARISON:  January 26, 2017 FINDINGS: Mild infiltrates are seen within the mid left lung and bilateral lung bases, left greater than right. There is a mild amount of pleural fluid seen along the lateral aspect  of the mid right lung. No pneumothorax is identified. The cardiac silhouette is mildly enlarged. The visualized skeletal structures are unremarkable. IMPRESSION: 1. Mild bilateral infiltrates. 2. Small lateral right pleural effusion. A small amount of loculated fluid cannot be excluded. Electronically Signed   By: Virgina Norfolk M.D.   On: 05/05/2019 17:02   CT ANGIO CHEST PE W OR WO CONTRAST  Result Date: 05/08/2019 CLINICAL DATA:  Shortness of breath. EXAM: CT ANGIOGRAPHY CHEST WITH CONTRAST TECHNIQUE: Multidetector CT imaging of the chest was performed using the standard protocol during bolus administration of intravenous contrast. Multiplanar CT image reconstructions and MIPs were obtained to evaluate the vascular anatomy. CONTRAST:  153mL OMNIPAQUE IOHEXOL 350 MG/ML SOLN COMPARISON:  March 30, 2019 FINDINGS: Cardiovascular: There is moderate severity calcification of the thoracic aorta satisfactory opacification of the pulmonary arteries to the segmental level. No evidence of pulmonary embolism. Normal heart size. No pericardial effusion. Mediastinum/Nodes: There is mild pretracheal lymphadenopathy. Lungs/Pleura: Marked severity diffuse ground-glass appearing infiltrates are seen throughout both lungs. There is no evidence of a pleural effusion or pneumothorax. Upper Abdomen: A stable 2.5 cm x 1.4 cm low-attenuation right adrenal mass is seen. Musculoskeletal: Multilevel degenerative changes seen throughout the thoracic spine. Review of the MIP images confirms the above findings. IMPRESSION: 1. Marked severity diffuse bilateral ground-glass appearing infiltrates. 2. No evidence of pulmonary embolism. 3. Stable low-attenuation right adrenal mass which may represent an adrenal adenoma. Electronically Signed   By: Virgina Norfolk M.D.   On: 05/08/2019 16:11   DG Chest Port 1 View  Result Date: 05/08/2019 CLINICAL DATA:  History of lung cancer, oxygen desaturation EXAM: PORTABLE CHEST 1 VIEW  COMPARISON:  05/11/2019 FINDINGS: Bilateral interstitial and patchy alveolar airspace opacities. No pleural effusion or pneumothorax. Stable cardiomediastinal silhouette. No aggressive osseous lesion. IMPRESSION: Bilateral patchy interstitial and alveolar airspace opacities as can be seen with multilobar pneumonia. Electronically Signed   By: Kathreen Devoid   On: 05/08/2019 12:14   [place] EKG SITE RITE  Result Date: 05/08/2019 If Site Rite image not attached, placement could not be confirmed due to current cardiac rhythm.    Phillips Climes M.D on 05/08/2019 at 5:03 PM  Between 7am to 7pm - Pager - [phone]  After 7pm go to [email or web address] - password [facility]  [facility] -  Office  [phone]

## 2019-05-08 NOTE — Telephone Encounter (Signed)
Thx for update, looks like he is admitted and our inpatient team will see him   Zandra Abts MD

## 2019-05-09 ENCOUNTER — Inpatient Hospital Stay (HOSPITAL_COMMUNITY): Payer: Medicare Other

## 2019-05-09 LAB — RESPIRATORY PANEL BY PCR

## 2019-05-09 LAB — CBC
HCT: 46.4 % (ref 39.0–52.0)
Hemoglobin: 15.1 g/dL (ref 13.0–17.0)
MCH: 28.1 pg (ref 26.0–34.0)
MCHC: 32.5 g/dL (ref 30.0–36.0)
MCV: 86.2 fL (ref 80.0–100.0)
Platelets: 183 10*3/uL (ref 150–400)
RBC: 5.38 MIL/uL (ref 4.22–5.81)
RDW: 13.4 % (ref 11.5–15.5)
WBC: 11 10*3/uL — ABNORMAL HIGH (ref 4.0–10.5)
nRBC: 0 % (ref 0.0–0.2)

## 2019-05-09 LAB — BASIC METABOLIC PANEL
Anion gap: 13 (ref 5–15)
BUN: 22 mg/dL (ref 8–23)
CO2: 24 mmol/L (ref 22–32)
Calcium: 8.8 mg/dL — ABNORMAL LOW (ref 8.9–10.3)
Chloride: 98 mmol/L (ref 98–111)
Creatinine, Ser: 0.88 mg/dL (ref 0.61–1.24)
GFR calc Af Amer: >60 mL/min (ref >60)
GFR calc non Af Amer: >60 mL/min (ref >60)
Glucose, Bld: 208 mg/dL — ABNORMAL HIGH (ref 70–99)
Potassium: 3.5 mmol/L (ref 3.5–5.1)
Sodium: 135 mmol/L (ref 135–145)

## 2019-05-09 LAB — EXPECTORATED SPUTUM ASSESSMENT W GRAM STAIN, RFLX TO RESP C

## 2019-05-09 LAB — PROCALCITONIN: Procalcitonin: 0.22 ng/mL

## 2019-05-09 LAB — LACTATE DEHYDROGENASE: LDH: 285 U/L — ABNORMAL HIGH (ref 98–192)

## 2019-05-09 LAB — GLUCOSE, CAPILLARY
Glucose-Capillary: 188 mg/dL — ABNORMAL HIGH (ref 70–99)
Glucose-Capillary: 241 mg/dL — ABNORMAL HIGH (ref 70–99)
Glucose-Capillary: 267 mg/dL — ABNORMAL HIGH (ref 70–99)
Glucose-Capillary: 218 mg/dL — ABNORMAL HIGH (ref 70–99)

## 2019-05-09 LAB — C-REACTIVE PROTEIN: CRP: 31.8 mg/dL — ABNORMAL HIGH (ref <1.0)

## 2019-05-09 MED ORDER — SALINE SPRAY 0.65 % NA SOLN
1.0000 | NASAL | Status: DC | PRN
  Administered 2019-05-09: 15:00:00 1 via NASAL
  Filled 2019-05-09: qty 44

## 2019-05-09 MED ORDER — POTASSIUM CHLORIDE CRYS ER 20 MEQ PO TBCR
40.0000 meq | EXTENDED_RELEASE_TABLET | Freq: Once | ORAL | Status: AC
  Administered 2019-05-09: 10:00:00 40 meq via ORAL
  Filled 2019-05-09: qty 2

## 2019-05-09 MED ORDER — SULFAMETHOXAZOLE-TRIMETHOPRIM 400-80 MG/5ML IV SOLN
15.0000 mg/kg/d | Freq: Four times a day (QID) | INTRAVENOUS | Status: DC
  Administered 2019-05-09 – 2019-05-15 (×24): 400.8 mg via INTRAVENOUS
  Filled 2019-05-09 (×15): qty 25.05
  Filled 2019-05-09: qty 5.05
  Filled 2019-05-09 (×20): qty 25.05

## 2019-05-09 MED ORDER — DILTIAZEM HCL 30 MG PO TABS
30.0000 mg | ORAL_TABLET | Freq: Four times a day (QID) | ORAL | Status: DC
  Administered 2019-05-09 (×2): 30 mg via ORAL
  Filled 2019-05-09 (×4): qty 1

## 2019-05-09 MED ORDER — METHYLPREDNISOLONE SODIUM SUCC 125 MG IJ SOLR
80.0000 mg | Freq: Three times a day (TID) | INTRAMUSCULAR | Status: DC
  Administered 2019-05-09 – 2019-05-14 (×15): 80 mg via INTRAVENOUS
  Filled 2019-05-09 (×15): qty 2

## 2019-05-09 NOTE — Progress Notes (Signed)
Pt HR still sustaining in the 120-140 range while on the Amio drip. MD made aware, order for 30 mg of Cardizem PO Q6. First dose given, will continue to monitor.

## 2019-05-09 NOTE — Progress Notes (Signed)
Pharmacy Antibiotic Note  Aaron Sellers is a 69 y.o. male admitted on 05/18/2019 with pneumonia.  Pharmacy has been consulted for Bactrim dosing. Chest xray bilateral PNA. H/O pneumonitis, on steroid taper. PCCM concerned may have PCP.  Plan: Bactrim IV (TMP 15mg /kg/day adjusted body wt 107kg) 400mg  IV q6h Continue cefepime 2gm IV q8h and azithromycin 500mg  IV q24h F/u cxs and clinical progress Monitor V/S, labs and levels as indicated  Height: 6\' 8"  (203.2 cm) Weight: 271 lb 13.2 oz (123.3 kg) IBW/kg (Calculated) : 96  Temp (24hrs), Avg:98.4 F (36.9 C), Min:97.3 F (36.3 C), Max:99 F (37.2 C)  Recent Labs  Lab 05/26/2019 1719 05/08/19 0402 05/09/19 0439  WBC 12.1* 12.5* 11.0*  CREATININE 0.81 1.01 0.88    Estimated Creatinine Clearance: 124.9 mL/min (by C-G formula based on SCr of 0.88 mg/dL).    Allergies  Allergen Reactions  . Ciprofloxacin     HIVES  . Adhesive [Tape] Other (See Comments)    Skin peels  . Codeine Other (See Comments)    Jitters, "skin crawling"    Antimicrobials this admission: Bactrim IV(TMP-SMX) 2/6 >>  Vanco 2/5 >> 2/6 Cefepime 2/5 >>  Azith 2/5 >> CTX 2/5 >> 2/5  Dose adjustments this admission: prn  Microbiology results: 2/5 MRSA PCR: negative 2/4 SARS-2 CV is negative 2/5 HIV screen is non reactive  Thank you for allowing pharmacy to be a part of this patient's care.  Isac Sarna, BS Pharm D, BCPS Clinical Pharmacist Pager [phone] 05/09/2019 2:20 PM

## 2019-05-09 NOTE — Progress Notes (Signed)
PROGRESS NOTE                                                                                                                                                                                                             Patient Demographics:    Aaron Sellers, is a 69 y.o. male, DOB - Apr 01, 1951, RXV:[id number]  Admit date - 05/06/2019   Admitting Physician Oswald Hillock, MD  Outpatient Primary MD for the patient is Janece Canterbury  LOS - 2   Chief Complaint  Patient presents with  . Shortness of Breath       Brief Narrative    69 y.o. 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  Patient states that he has been having shortness of breath with palpitations and at times was lightheaded.  Denies any chest pain. He was found to be in A. fib with RVR, admitted to stepdown for heart rate control on Cardizem drip, as well patient had progressive hypoxia within first 24 hours on admission, which has escalated rapidly, he D-dimers were elevated, CTA chest with no evidence of PE, but significant for severe interstitial lung disease.    Subjective:    Aaron Sellers today reports dyspnea, cough, denies any chest pain .   Assessment  & Plan :    Active Problems:   Atrial fibrillation with RVR (HCC)  Acute hypoxic respiratory failure. -Patient currently with significant oxygen requirement, he is on 12 L high flow nasal cannula. -No definite diagnosis yet, but it is likely due to atypical infection versus inflammatory/autoimmune etiology . -Patient empirically on IV vancomycin, cefepime, and azithromycin, I have discussed with PCCM, patient has been on  steroids for 5 weeks, will need coverage for atypical infections, continue with azithromycin, cefepime, will DC vancomycin, will start on IV Bactrim treatment dose till PCP is ruled out (at this point patient is unstable for bronchoscopy given high oxygen requirement ). -Inflammatory/autoimmune process is still possibility, continue with IV Solu-Medrol 80 mg every 8 hours . -As well patient with known history of Keytruda pneumonitis, possibly worsened once steroid  has been tapered, continue with IV steroids for now , discussed with oncology . -Patient encouraged with incentive spirometry, and flutter valve . -Difficult evidence of volume overload . -We will send Fungitell and LDH and respiratory viral panel . possibly related to infectious etiology, versus inflammatory etiology. -Calcitonin trending down, but CRP is trending up. -D-dimer is elevated, but CTA chest is negative for PE. -COVID-19 negative.   COVID-19 Labs  Recent Labs    05/08/19 1159 05/09/19 0440  DDIMER 2.11*  --   CRP 22.8* 31.8*    Lab Results  Component Value Date   SARSCOV2NAA NEGATIVE 05/27/2019   A. fib with RVR -Management per cardiology, Cardizem drip transition to amiodarone drip.  We will keep on amiodarone only for short period of time, no plan for long-term treatment given his lung disease. -Not on anticoagulation previously given his history of bowel perforation, known Stage 4 Lung Cancer and previous CHA2DS2-VASc Score of 1.   Stage IV squamous cell carcinoma - patient followed by oncology as outpatient.   Code Status : Full(reports he is okay with intubation for short period of time, but does not want to be kept alive on life support)  Family Communication  : Discussed with patient significant other with his permission, they have been partner for more than 30 years, she is being updated daily  Disposition Plan  : Remains ICU  Barriers For Discharge :  on amiodarone drip, IV steroids, IV  antibiotics and significant oxygen requirement  Consults  : Cardiology, discussed with oncology Dr Maylon Peppers, and PCCM   Procedures  :   DVT Prophylaxis  :    Lab Results  Component Value Date   PLT 183 05/09/2019    Antibiotics  :    Anti-infectives (From admission, onward)   Start     Dose/Rate Route Frequency Ordered Stop   05/09/19 1500  sulfamethoxazole-trimethoprim (BACTRIM) 400.8 mg in dextrose 5 % 500 mL IVPB     15 mg/kg/day  106.9 kg (Adjusted) 350 mL/hr over 90 Minutes Intravenous Every 6 hours 05/09/19 1428     05/09/19 0800  vancomycin (VANCOREADY) IVPB 1250 mg/250 mL  Status:  Discontinued     1,250 mg 166.7 mL/hr over 90 Minutes Intravenous Every 12 hours 05/08/19 1725 05/09/19 1412   05/08/19 1900  vancomycin (VANCOREADY) IVPB 2000 mg/400 mL     2,000 mg 200 mL/hr over 120 Minutes Intravenous  Once 05/08/19 1720 05/08/19 2145   05/08/19 1800  ceFEPIme (MAXIPIME) 2 g in sodium chloride 0.9 % 100 mL IVPB     2 g 200 mL/hr over 30 Minutes Intravenous Every 8 hours 05/08/19 1719     05/08/19 1445  azithromycin (ZITHROMAX) 500 mg in sodium chloride 0.9 % 250 mL IVPB     500 mg 250 mL/hr over 60 Minutes Intravenous Every 24 hours 05/08/19 1440     05/08/19 1445  cefTRIAXone (ROCEPHIN) 2 g in sodium chloride 0.9 % 100 mL IVPB  Status:  Discontinued     2 g 200 mL/hr over 30 Minutes Intravenous Every 24 hours 05/08/19 1440 05/08/19 1719        Objective:   Vitals:   05/09/19 1200 05/09/19 1300 05/09/19 1331 05/09/19 1400  BP: 114/76 112/84  122/82  Pulse: 77 93 92 (!) 110  Resp: (!) 24 (!) 31 (!) 29 (!) 26  Temp:      TempSrc:      SpO2: 92% (!) 88% (!) 87% (!) 88%  Weight:  Height:        Wt Readings from Last 3 Encounters:  05/08/19 123.3 kg  04/01/19 129.5 kg  03/19/19 129.2 kg     Intake/Output Summary (Last 24 hours) at 05/09/2019 1436 Last data filed at 05/09/2019 0800 Gross per 24 hour  Intake 986.14 ml  Output 2300 ml  Net -1313.86 ml      Physical Exam  Awake Alert, Oriented X 3, No new F.N deficits, Normal affect Symmetrical Chest wall movement, Good air movement bilaterally, diffuse B/L rales IRR IRR ,No Gallops,Rubs or new Murmurs, No Parasternal Heave +ve B.Sounds, Abd Soft, No tenderness, No rebound - guarding or rigidity. No Cyanosis, Clubbing or edema, No new Rash or bruise       Data Review:    CBC Recent Labs  Lab 05/30/2019 1719 05/08/19 0402 05/09/19 0439  WBC 12.1* 12.5* 11.0*  HGB 16.0 15.7 15.1  HCT 48.5 48.7 46.4  PLT 229 211 183  MCV 86.6 87.3 86.2  MCH 28.6 28.1 28.1  MCHC 33.0 32.2 32.5  RDW 13.4 13.7 13.4  LYMPHSABS 1.1  --   --   MONOABS 0.4  --   --   EOSABS 0.0  --   --   BASOSABS 0.0  --   --     Chemistries  Recent Labs  Lab 05/22/2019 1719 05/08/19 0402 05/08/19 1159 05/09/19 0439  NA 134* 134*  --  135  K 3.8 4.8  --  3.5  CL 98 96*  --  98  CO2 24 23  --  24  GLUCOSE 136* 147*  --  208*  BUN 17 17  --  22  CREATININE 0.81 1.01  --  0.88  CALCIUM 8.7* 8.8*  --  8.8*  MG  --   --  2.0  --   AST 31 38  --   --   ALT 38 40  --   --   ALKPHOS 85 73  --   --   BILITOT 1.9* 2.9*  --   --    ------------------------------------------------------------------------------------------------------------------ No results for input(s): CHOL, HDL, LDLCALC, TRIG, CHOLHDL, LDLDIRECT in the last 72 hours.  Lab Results  Component Value Date   HGBA1C 7.0 (H) 05/08/2019   ------------------------------------------------------------------------------------------------------------------ Recent Labs    05/08/19 1159  TSH 1.828   ------------------------------------------------------------------------------------------------------------------ No results for input(s): VITAMINB12, FOLATE, FERRITIN, TIBC, IRON, RETICCTPCT in the last 72 hours.  Coagulation profile No results for input(s): INR, PROTIME in the last 168 hours.  Recent Labs    05/08/19 1159  DDIMER 2.11*     Cardiac Enzymes No results for input(s): CKMB, TROPONINI, MYOGLOBIN in the last 168 hours.  Invalid input(s): CK ------------------------------------------------------------------------------------------------------------------    Component Value Date/Time   BNP 104.0 (H) 05/23/2019 1720    Inpatient Medications  Scheduled Meds: . atorvastatin  10 mg Oral QPM  . Chlorhexidine Gluconate Cloth  6 each Topical Daily  . enoxaparin (LOVENOX) injection  40 mg Subcutaneous QHS  . gabapentin  300 mg Oral BID  . insulin aspart  0-5 Units Subcutaneous QHS  . insulin aspart  0-9 Units Subcutaneous TID WC  . methylPREDNISolone (SOLU-MEDROL) injection  60 mg Intravenous Q8H  . pantoprazole  40 mg Oral Daily  . sodium chloride flush  3 mL Intravenous Q12H  . tamsulosin  0.4 mg Oral Daily   Continuous Infusions: . sodium chloride    . amiodarone 30 mg/hr (05/09/19 1401)  . azithromycin 500 mg (05/09/19 1405)  . ceFEPime (  MAXIPIME) IV 2 g (05/09/19 0956)  . sulfamethoxazole-trimethoprim     PRN Meds:.sodium chloride, acetaminophen, ondansetron **OR** ondansetron (ZOFRAN) IV, sodium chloride, sodium chloride flush  Micro Results Recent Results (from the past 240 hour(s))  SARS CORONAVIRUS 2 (TAT 6-24 HRS) Nasopharyngeal Nasopharyngeal Swab     Status: None   Collection Time: 05/06/2019  7:36 PM   Specimen: Nasopharyngeal Swab  Result Value Ref Range Status   SARS Coronavirus 2 NEGATIVE NEGATIVE Final    Comment: (NOTE) SARS-CoV-2 target nucleic acids are NOT DETECTED. The SARS-CoV-2 RNA is generally detectable in upper and lower respiratory specimens during the acute phase of infection. Negative results do not preclude SARS-CoV-2 infection, do not rule out co-infections with other pathogens, and should not be used as the sole basis for treatment or other patient management decisions. Negative results must be combined with clinical observations, patient history, and epidemiological  information. The expected result is Negative. Fact Sheet for Patients: SugarRoll.be Fact Sheet for Healthcare Providers: https://www.woods-mathews.com/ This test is not yet approved or cleared by the [place] FDA and  has been authorized for detection and/or diagnosis of SARS-CoV-2 by FDA under an Emergency Use Authorization (EUA). This EUA will remain  in effect (meaning this test can be used) for the duration of the COVID-19 declaration under Section 56 4(b)(1) of the Act, 21 U.S.C. section 360bbb-3(b)(1), unless the authorization is terminated or revoked sooner. Performed at [facility], [place] [address], [place], [place] [address]   MRSA PCR Screening     Status: None   Collection Time: 05/08/19  3:08 AM   Specimen: Nasal Mucosa; Nasopharyngeal  Result Value Ref Range Status   MRSA by PCR NEGATIVE NEGATIVE Final    Comment:        The GeneXpert MRSA Assay (FDA approved for NASAL specimens only), is one component of a comprehensive MRSA colonization surveillance program. It is not intended to diagnose MRSA infection nor to guide or monitor treatment for MRSA infections. Performed at [facility], [address] [place], [place] [address]     Radiology Reports DG Chest 2 View  Result Date: 05/12/2019 CLINICAL DATA:  Shortness of breath. EXAM: CHEST - 2 VIEW COMPARISON:  January 26, 2017 FINDINGS: Mild infiltrates are seen within the mid left lung and bilateral lung bases, left greater than right. There is a mild amount of pleural fluid seen along the lateral aspect of the mid right lung. No pneumothorax is identified. The cardiac silhouette is mildly enlarged. The visualized skeletal structures are unremarkable. IMPRESSION: 1. Mild bilateral infiltrates. 2. Small lateral right pleural effusion. A small amount of loculated fluid cannot be excluded. Electronically Signed   By: Virgina Norfolk M.D.   On: 05/31/2019 17:02   CT  ANGIO CHEST PE W OR WO CONTRAST  Result Date: 05/08/2019 CLINICAL DATA:  Shortness of breath. EXAM: CT ANGIOGRAPHY CHEST WITH CONTRAST TECHNIQUE: Multidetector CT imaging of the chest was performed using the standard protocol during bolus administration of intravenous contrast. Multiplanar CT image reconstructions and MIPs were obtained to evaluate the vascular anatomy. CONTRAST:  1110mL OMNIPAQUE IOHEXOL 350 MG/ML SOLN COMPARISON:  March 30, 2019 FINDINGS: Cardiovascular: There is moderate severity calcification of the thoracic aorta satisfactory opacification of the pulmonary arteries to the segmental level. No evidence of pulmonary embolism. Normal heart size. No pericardial effusion. Mediastinum/Nodes: There is mild pretracheal lymphadenopathy. Lungs/Pleura: Marked severity diffuse ground-glass appearing infiltrates are seen throughout both lungs. There is no evidence of a pleural effusion or  pneumothorax. Upper Abdomen: A stable 2.5 cm x 1.4 cm low-attenuation right adrenal mass is seen. Musculoskeletal: Multilevel degenerative changes seen throughout the thoracic spine. Review of the MIP images confirms the above findings. IMPRESSION: 1. Marked severity diffuse bilateral ground-glass appearing infiltrates. 2. No evidence of pulmonary embolism. 3. Stable low-attenuation right adrenal mass which may represent an adrenal adenoma. Electronically Signed   By: Virgina Norfolk M.D.   On: 05/08/2019 16:11   DG CHEST PORT 1 VIEW  Result Date: 05/09/2019 CLINICAL DATA:  PICC placement EXAM: PORTABLE CHEST 1 VIEW COMPARISON:  Yesterday FINDINGS: Patchy bilateral pneumonia. There is cardiomegaly and vascular pedicle widening. Right PICC with tip at the SVC. No evidence of effusion or pneumothorax. IMPRESSION: 1. Right-sided PICC in good position. 2. Extensive bilateral pneumonia. Electronically Signed   By: Monte Fantasia M.D.   On: 05/09/2019 14:02   DG Chest Port 1 View  Result Date: 05/08/2019 CLINICAL  DATA:  History of lung cancer, oxygen desaturation EXAM: PORTABLE CHEST 1 VIEW COMPARISON:  05/16/2019 FINDINGS: Bilateral interstitial and patchy alveolar airspace opacities. No pleural effusion or pneumothorax. Stable cardiomediastinal silhouette. No aggressive osseous lesion. IMPRESSION: Bilateral patchy interstitial and alveolar airspace opacities as can be seen with multilobar pneumonia. Electronically Signed   By: Kathreen Devoid   On: 05/08/2019 12:14   [place] EKG SITE RITE  Result Date: 05/08/2019 If Site Rite image not attached, placement could not be confirmed due to current cardiac rhythm.    Phillips Climes M.D on 05/09/2019 at 2:36 PM  Between 7am to 7pm - Pager - [phone]  After 7pm go to [email or web address] - password [facility]  [facility] -  Office  [phone]

## 2019-05-09 NOTE — Progress Notes (Signed)
Spoke with [place] RN re PICC order.  States CVW has been notified.

## 2019-05-10 LAB — BASIC METABOLIC PANEL
Anion gap: 14 (ref 5–15)
BUN: 25 mg/dL — ABNORMAL HIGH (ref 8–23)
CO2: 24 mmol/L (ref 22–32)
Calcium: 8.6 mg/dL — ABNORMAL LOW (ref 8.9–10.3)
Chloride: 95 mmol/L — ABNORMAL LOW (ref 98–111)
Creatinine, Ser: 0.73 mg/dL (ref 0.61–1.24)
GFR calc Af Amer: >60 mL/min (ref >60)
GFR calc non Af Amer: >60 mL/min (ref >60)
Glucose, Bld: 181 mg/dL — ABNORMAL HIGH (ref 70–99)
Potassium: 3.9 mmol/L (ref 3.5–5.1)
Sodium: 133 mmol/L — ABNORMAL LOW (ref 135–145)

## 2019-05-10 LAB — CBC
HCT: 44.6 % (ref 39.0–52.0)
Hemoglobin: 14.7 g/dL (ref 13.0–17.0)
MCH: 27.9 pg (ref 26.0–34.0)
MCHC: 33 g/dL (ref 30.0–36.0)
MCV: 84.8 fL (ref 80.0–100.0)
Platelets: 166 10*3/uL (ref 150–400)
RBC: 5.26 MIL/uL (ref 4.22–5.81)
RDW: 13.2 % (ref 11.5–15.5)
WBC: 18.6 10*3/uL — ABNORMAL HIGH (ref 4.0–10.5)
nRBC: 0 % (ref 0.0–0.2)

## 2019-05-10 LAB — PROCALCITONIN: Procalcitonin: 0.24 ng/mL

## 2019-05-10 LAB — GLUCOSE, CAPILLARY
Glucose-Capillary: 178 mg/dL — ABNORMAL HIGH (ref 70–99)
Glucose-Capillary: 238 mg/dL — ABNORMAL HIGH (ref 70–99)
Glucose-Capillary: 197 mg/dL — ABNORMAL HIGH (ref 70–99)
Glucose-Capillary: 237 mg/dL — ABNORMAL HIGH (ref 70–99)

## 2019-05-10 LAB — HEPARIN LEVEL (UNFRACTIONATED): Heparin Unfractionated: 0.24 IU/mL — ABNORMAL LOW (ref 0.30–0.70)

## 2019-05-10 LAB — C-REACTIVE PROTEIN: CRP: 17.6 mg/dL — ABNORMAL HIGH (ref <1.0)

## 2019-05-10 MED ORDER — PANTOPRAZOLE SODIUM 40 MG IV SOLR
40.0000 mg | INTRAVENOUS | Status: DC
  Administered 2019-05-10 – 2019-05-15 (×6): 40 mg via INTRAVENOUS
  Filled 2019-05-10 (×6): qty 40

## 2019-05-10 MED ORDER — LORAZEPAM 2 MG/ML IJ SOLN
0.5000 mg | INTRAMUSCULAR | Status: DC | PRN
  Administered 2019-05-10 – 2019-05-13 (×7): 0.5 mg via INTRAVENOUS
  Filled 2019-05-10 (×7): qty 1

## 2019-05-10 MED ORDER — LEVALBUTEROL HCL 0.63 MG/3ML IN NEBU
INHALATION_SOLUTION | RESPIRATORY_TRACT | Status: AC
  Administered 2019-05-10: 0.63 mg
  Filled 2019-05-10: qty 3

## 2019-05-10 MED ORDER — ORAL CARE MOUTH RINSE
15.0000 mL | Freq: Two times a day (BID) | OROMUCOSAL | Status: DC
  Administered 2019-05-10 – 2019-05-11 (×3): 15 mL via OROMUCOSAL

## 2019-05-10 MED ORDER — DILTIAZEM LOAD VIA INFUSION
15.0000 mg | Freq: Once | INTRAVENOUS | Status: AC
  Administered 2019-05-10: 15 mg via INTRAVENOUS
  Filled 2019-05-10: qty 15

## 2019-05-10 MED ORDER — IPRATROPIUM BROMIDE 0.02 % IN SOLN
RESPIRATORY_TRACT | Status: AC
  Administered 2019-05-10: 0.5 mg
  Filled 2019-05-10: qty 2.5

## 2019-05-10 MED ORDER — DILTIAZEM LOAD VIA INFUSION
15.0000 mg | Freq: Once | INTRAVENOUS | Status: AC
  Administered 2019-05-10: 16:00:00 15 mg via INTRAVENOUS
  Filled 2019-05-10: qty 15

## 2019-05-10 MED ORDER — DILTIAZEM HCL-DEXTROSE 125-5 MG/125ML-% IV SOLN (PREMIX)
5.0000 mg/h | INTRAVENOUS | Status: DC
  Administered 2019-05-10: 5 mg/h via INTRAVENOUS
  Administered 2019-05-10 – 2019-05-11 (×4): 15 mg/h via INTRAVENOUS
  Administered 2019-05-12: 12.5 mg/h via INTRAVENOUS
  Filled 2019-05-10 (×6): qty 125

## 2019-05-10 MED ORDER — HEPARIN BOLUS VIA INFUSION
2000.0000 [IU] | Freq: Once | INTRAVENOUS | Status: AC
  Administered 2019-05-10: 2000 [IU] via INTRAVENOUS
  Filled 2019-05-10: qty 2000

## 2019-05-10 MED ORDER — LEVALBUTEROL HCL 0.63 MG/3ML IN NEBU
0.6300 mg | INHALATION_SOLUTION | Freq: Four times a day (QID) | RESPIRATORY_TRACT | Status: DC
  Administered 2019-05-11 (×3): 0.63 mg via RESPIRATORY_TRACT
  Filled 2019-05-10 (×3): qty 3

## 2019-05-10 MED ORDER — HEPARIN (PORCINE) 25000 UT/250ML-% IV SOLN
1700.0000 [IU]/h | INTRAVENOUS | Status: DC
  Administered 2019-05-10: 11:00:00 1700 [IU]/h via INTRAVENOUS
  Administered 2019-05-10: 1950 [IU]/h via INTRAVENOUS
  Administered 2019-05-11 (×2): 2250 [IU]/h via INTRAVENOUS
  Administered 2019-05-12 (×2): 2150 [IU]/h via INTRAVENOUS
  Administered 2019-05-13: 21:00:00 2000 [IU]/h via INTRAVENOUS
  Administered 2019-05-13: 09:00:00 2150 [IU]/h via INTRAVENOUS
  Administered 2019-05-14 – 2019-05-15 (×2): 1700 [IU]/h via INTRAVENOUS
  Filled 2019-05-10 (×10): qty 250

## 2019-05-10 MED ORDER — SODIUM CHLORIDE 0.9 % IV SOLN
5.0000 mg/kg | Freq: Once | INTRAVENOUS | Status: DC
  Filled 2019-05-10: qty 60

## 2019-05-10 MED ORDER — DILTIAZEM LOAD VIA INFUSION
10.0000 mg | Freq: Once | INTRAVENOUS | Status: AC
  Administered 2019-05-10: 13:00:00 10 mg via INTRAVENOUS

## 2019-05-10 MED ORDER — SODIUM CHLORIDE 0.9 % IV SOLN
5.0000 mg/kg | Freq: Once | INTRAVENOUS | Status: AC
  Administered 2019-05-10: 500 mg via INTRAVENOUS
  Filled 2019-05-10: qty 50

## 2019-05-10 MED ORDER — IPRATROPIUM BROMIDE 0.02 % IN SOLN
0.5000 mg | Freq: Four times a day (QID) | RESPIRATORY_TRACT | Status: DC
  Administered 2019-05-11 (×3): 0.5 mg via RESPIRATORY_TRACT
  Filled 2019-05-10 (×3): qty 2.5

## 2019-05-10 MED ORDER — HEPARIN BOLUS VIA INFUSION
4000.0000 [IU] | Freq: Once | INTRAVENOUS | Status: AC
  Administered 2019-05-10: 4000 [IU] via INTRAVENOUS
  Filled 2019-05-10: qty 4000

## 2019-05-10 MED ORDER — CHLORHEXIDINE GLUCONATE 0.12 % MT SOLN
15.0000 mL | Freq: Two times a day (BID) | OROMUCOSAL | Status: DC
  Administered 2019-05-11: 15 mL via OROMUCOSAL
  Filled 2019-05-10: qty 15

## 2019-05-10 MED ORDER — ACETAMINOPHEN 325 MG PO TABS: 650.0000 mg | ORAL_TABLET | Freq: Four times a day (QID) | ORAL | Status: DC | PRN

## 2019-05-10 NOTE — Progress Notes (Signed)
Pt taken off bipap for a few minutes. Pt sats decreased to 70's. Pt placed back on bipap sats increased 90%. Pt remains alert and oriented. RT made aware.

## 2019-05-10 NOTE — Progress Notes (Signed)
Notified MD of pt's HR remaining high 130's-140's. New orders received will continue to monitor.

## 2019-05-10 NOTE — Progress Notes (Signed)
Dr. Kennon Holter paged to make aware of pts HR 140s- RR-30-40, and oxygen saturation of barely 90%. Requesting something stronger for anxiety and made MD aware of 0.5 ativan given earlier with no success. Waiting for call back/orders. Will continue to monitor pt

## 2019-05-10 NOTE — Progress Notes (Signed)
Pt expresses some relief in work of breathing at this time.

## 2019-05-10 NOTE — Progress Notes (Signed)
ANTICOAGULATION CONSULT NOTE -   Pharmacy Consult for Heparin Indication: atrial fibrillation  Allergies  Allergen Reactions  . Ciprofloxacin     HIVES  . Adhesive [Tape] Other (See Comments)    Skin peels  . Codeine Other (See Comments)    Jitters, "skin crawling"    Patient Measurements: Height: 6\' 8"  (203.2 cm) Weight: 275 lb 12.7 oz (125.1 kg) IBW/kg (Calculated) : 96 HEPARIN DW (KG): 121  Vital Signs: Temp: 97.6 F (36.4 C) (02/07 1632) Temp Source: Axillary (02/07 1632) BP: 127/99 (02/07 1718) Pulse Rate: 135 (02/07 1718)  Labs: Recent Labs    05/08/19 0402 05/08/19 0402 05/09/19 0439 05/10/19 0454 05/10/19 1648  HGB 15.7   < > 15.1 14.7  --   HCT 48.7  --  46.4 44.6  --   PLT 211  --  183 166  --   HEPARINUNFRC  --   --   --   --  0.24*  CREATININE 1.01  --  0.88 0.73  --    < > = values in this interval not displayed.    Estimated Creatinine Clearance: 138.2 mL/min (by C-G formula based on SCr of 0.73 mg/dL).   Medical History: Past Medical History:  Diagnosis Date  . Arthritis   . Atrial flutter ([place])   . BPH (benign prostatic hypertrophy) with urinary retention   . Cancer ([place])   . Chronic kidney disease    hx of kidney stones  2011  . COPD (chronic obstructive pulmonary disease) ([place])    has been smoking since he was 22 ish--  . High cholesterol   . Hypertension    dx around 2011  . Squamous cell carcinoma of right lung (HCC)     Medications:  Medications Prior to Admission  Medication Sig Dispense Refill Last Dose  . acetaminophen (TYLENOL) 500 MG tablet Take 1,000 mg by mouth every 6 (six) hours as needed for moderate pain.   unknown at Unknown time  . atorvastatin (LIPITOR) 10 MG tablet Take 10 mg by mouth every evening.    05/06/2019 at Unknown time  . CARTIA XT 180 MG 24 hr capsule Take 1 capsule by mouth once daily (Patient taking differently: Take 180 mg by mouth daily. ) 90 capsule 0 05/19/2019 at Unknown time  . Cyanocobalamin  (VITAMIN B-12 PO) Take 1 tablet by mouth daily.   05/08/2019 at Unknown time  . famotidine (PEPCID) 10 MG tablet Take 10 mg by mouth as needed for heartburn or indigestion.   05/06/2019 at Unknown time  . furosemide (LASIX) 40 MG tablet Take 1 tablet (40 mg total) by mouth daily as needed. 90 tablet 3 Past Week at Unknown time  . gabapentin (NEURONTIN) 300 MG capsule TAKE 1 CAPSULE BY MOUTH THREE TIMES DAILY (Patient taking differently: Take 300 mg by mouth 2 (two) times daily. ) 90 capsule 0 05/13/2019 at Unknown time  . loratadine (CLARITIN) 10 MG tablet Take 10 mg by mouth daily as needed for allergies.    unknown  . tamsulosin (FLOMAX) 0.4 MG CAPS capsule Take 0.4 mg by mouth daily.   05/11/2019 at Unknown time  . terbinafine ([employer] ATHLETES FOOT) 1 % cream Apply 1 application topically daily.   Past Week at Unknown time  . predniSONE (DELTASONE) 20 MG tablet Take 6 tablets daily for 2 weeks, then take 5 tablets daily for 1 week, then take 4 tablets for 1 week, then take 3 tablets for 1 week, then take 2 tablet for  1 week, then take 1 tablet for 1 week, then take 0.5 tablets for 1 week. 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 D-dimers were elevated, CTA chest with no evidence of PE, but significant for severe interstitial lung disease. Pharmacy asked to anticoagulate with heparin for afib. HL is 0.24, subtherapeutic  Goal of Therapy:  Heparin level 0.3-0.7 units/ml Monitor platelets by anticoagulation protocol: Yes   Plan:  Give 2000 units bolus x 1 Increase heparin infusion to 1950 units/hr Check anti-Xa level in ~6-8 hours and daily while on  heparin Continue to monitor H&H and platelets  Aaron Sellers, BS Vena [place], BCPS Clinical Pharmacist Pager [phone] 05/10/2019,5:31 PM

## 2019-05-10 NOTE — Progress Notes (Signed)
PROGRESS NOTE                                                                                                                                                                                                             Patient Demographics:    Aaron Sellers, is a 69 y.o. male, DOB - 04/20/1950, YPP:[id number]  Admit date - 05/17/2019   Admitting Physician Oswald Hillock, MD  Outpatient Primary MD for the patient is Janece Canterbury  LOS - 3   Chief Complaint  Patient presents with  . Shortness of Breath       Brief Narrative    69 y.o. 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  Patient states that he has been having shortness of breath with palpitations and at times was lightheaded.  Denies any chest pain. He was found to be in A. fib with RVR, admitted to stepdown for heart rate control on Cardizem drip, as well patient had progressive hypoxia within first 24 hours on admission, which has escalated rapidly, he D-dimers were elevated, CTA chest with no evidence of PE, but significant for severe interstitial lung disease,     Subjective:    Aaron Sellers today reports dyspnea, cough, denies any chest pain .  He was started on BiPAP overnight.   Assessment  & Plan :    Active Problems:   Atrial fibrillation with RVR (HCC)  Acute hypoxic respiratory failure. -Patient currently with significant oxygen requirement, he was transitioned to BiPAP overnight given persistent hypoxia on high flow nasal cannula. -Case was discussed with PCCM and oncology. -No clear etiology, but highly suspicious for Keytruda pneumonitis, no improvement with IV steroids,  to need to have worsening respiratory failure, high risk for aspiration, as discussed with oncology , at this point need to be treated with immunosuppressive therapy, patient was given infliximab 5 mg/kg x1 dose.  Continue with IV Solu-Medrol at 80 mg IV every 8 hours. -Procalcitonin 0.24, which is reassuring. -Patient empirically on IV vancomycin, cefepime, and azithromycin, I have discussed with PCCM, patient has been on steroids  for 5 weeks, will need coverage for atypical infections, continue with azithromycin, cefepime, will DC vancomycin, will start on IV Bactrim treatment dose till PCP is ruled out (at this point patient is unstable for bronchoscopy given high oxygen requirement ). -Patient encouraged with incentive spirometry, and flutter valve . -No evidence of volume overload . -follow Fungitell , which is elevated, viral panel is negative possibly related to infectious etiology, versus inflammatory etiology. -D-dimer is elevated, but CTA chest is negative for PE. -COVID-19 negative. -Patient is at high risk for intubation, I have discussed with him, and significant other, he remains full code.   COVID-19 Labs  Recent Labs    05/08/19 1159 05/09/19 0440 05/09/19 1431 05/10/19 0454  DDIMER 2.11*  --   --   --   LDH  --   --  285*  --   CRP 22.8* 31.8*  --  17.6*    Lab Results  Component Value Date   SARSCOV2NAA NEGATIVE 05/27/2019   A. fib with RVR -Management per cardiology, patient on Cardizem drip, transition to amiodarone(will keep on amiodarone only for short period given his lung disease), heart rate remain significantly elevated, patient was started on Cardizem drip as well. -Started on heparin GTT for anticoagulation.  Stage IV squamous cell carcinoma - patient followed by oncology as outpatient.   Code Status : Full(reports he is okay with intubation for short period of time, but does not want to be kept alive on life support)  Family Communication  :  Discussed with patient significant other with his permission, they have been partner for more than 30 years, she is being updated daily,   Disposition Plan  : Remains ICU  Barriers For Discharge :  on amiodarone drip, IV steroids, IV antibiotics and significant oxygen requirement  Consults  : Cardiology, discussed with oncology Dr Maylon Peppers, and PCCM   Procedures  :   DVT Prophylaxis  :    Lab Results  Component Value Date   PLT 166 05/10/2019    Antibiotics  :    Anti-infectives (From admission, onward)   Start     Dose/Rate Route Frequency Ordered Stop   05/09/19 1500  sulfamethoxazole-trimethoprim (BACTRIM) 400.8 mg in dextrose 5 % 500 mL IVPB     15 mg/kg/day  106.9 kg (Adjusted) 350 mL/hr over 90 Minutes Intravenous Every 6 hours 05/09/19 1428     05/09/19 0800  vancomycin (VANCOREADY) IVPB 1250 mg/250 mL  Status:  Discontinued     1,250 mg 166.7 mL/hr over 90 Minutes Intravenous Every 12 hours 05/08/19 1725 05/09/19 1412   05/08/19 1900  vancomycin (VANCOREADY) IVPB 2000 mg/400 mL     2,000 mg 200 mL/hr over 120 Minutes Intravenous  Once 05/08/19 1720 05/08/19 2145   05/08/19 1800  ceFEPIme (MAXIPIME) 2 g in sodium chloride 0.9 % 100 mL IVPB     2 g 200 mL/hr over 30 Minutes Intravenous Every 8 hours 05/08/19 1719     05/08/19 1445  azithromycin (ZITHROMAX) 500 mg in sodium chloride 0.9 % 250 mL IVPB     500 mg 250 mL/hr over 60 Minutes Intravenous Every 24 hours 05/08/19 1440     05/08/19 1445  cefTRIAXone (ROCEPHIN) 2 g in sodium chloride 0.9 % 100 mL IVPB  Status:  Discontinued     2 g 200 mL/hr over 30 Minutes Intravenous Every 24 hours 05/08/19 1440 05/08/19 1719        Objective:   Vitals:   05/10/19 1415 05/10/19  1430 05/10/19 1445 05/10/19 1500  BP: 127/79 117/89 117/90 124/84  Pulse: 90 74 (!) 54 (!) 151  Resp: (!) 37 (!) 36 (!) 35 (!) 36  Temp:      TempSrc:      SpO2:    92%  Weight:      Height:        Wt Readings from Last 3 Encounters:    05/10/19 125.1 kg  04/01/19 129.5 kg  03/19/19 129.2 kg     Intake/Output Summary (Last 24 hours) at 05/10/2019 1537 Last data filed at 05/10/2019 1522 Gross per 24 hour  Intake 3340.56 ml  Output 1000 ml  Net 2340.56 ml     Physical Exam  Awake Alert, Oriented X 3, No new F.N deficits, Normal affect, on BiPAP Symmetrical Chest wall movement, Good air movement bilaterally, scattered rhonchi Irregular irregular,No Gallops,Rubs or new Murmurs, No Parasternal Heave +ve B.Sounds, Abd Soft, No tenderness, No rebound - guarding or rigidity. No Cyanosis, Clubbing or edema, No new Rash or bruise        Data Review:    CBC Recent Labs  Lab 05/08/2019 1719 05/08/19 0402 05/09/19 0439 05/10/19 0454  WBC 12.1* 12.5* 11.0* 18.6*  HGB 16.0 15.7 15.1 14.7  HCT 48.5 48.7 46.4 44.6  PLT 229 211 183 166  MCV 86.6 87.3 86.2 84.8  MCH 28.6 28.1 28.1 27.9  MCHC 33.0 32.2 32.5 33.0  RDW 13.4 13.7 13.4 13.2  LYMPHSABS 1.1  --   --   --   MONOABS 0.4  --   --   --   EOSABS 0.0  --   --   --   BASOSABS 0.0  --   --   --     Chemistries  Recent Labs  Lab 05/05/2019 1719 05/08/19 0402 05/08/19 1159 05/09/19 0439 05/10/19 0454  NA 134* 134*  --  135 133*  K 3.8 4.8  --  3.5 3.9  CL 98 96*  --  98 95*  CO2 24 23  --  24 24  GLUCOSE 136* 147*  --  208* 181*  BUN 17 17  --  22 25*  CREATININE 0.81 1.01  --  0.88 0.73  CALCIUM 8.7* 8.8*  --  8.8* 8.6*  MG  --   --  2.0  --   --   AST 31 38  --   --   --   ALT 38 40  --   --   --   ALKPHOS 85 73  --   --   --   BILITOT 1.9* 2.9*  --   --   --    ------------------------------------------------------------------------------------------------------------------ No results for input(s): CHOL, HDL, LDLCALC, TRIG, CHOLHDL, LDLDIRECT in the last 72 hours.  Lab Results  Component Value Date   HGBA1C 7.0 (H) 05/08/2019    ------------------------------------------------------------------------------------------------------------------ Recent Labs    05/08/19 1159  TSH 1.828   ------------------------------------------------------------------------------------------------------------------ No results for input(s): VITAMINB12, FOLATE, FERRITIN, TIBC, IRON, RETICCTPCT in the last 72 hours.  Coagulation profile No results for input(s): INR, PROTIME in the last 168 hours.  Recent Labs    05/08/19 1159  DDIMER 2.11*    Cardiac Enzymes No results for input(s): CKMB, TROPONINI, MYOGLOBIN in the last 168 hours.  Invalid input(s): CK ------------------------------------------------------------------------------------------------------------------    Component Value Date/Time   BNP 104.0 (H) 05/29/2019 1720    Inpatient Medications  Scheduled Meds: . atorvastatin  10 mg Oral QPM  . chlorhexidine  15 mL  Mouth Rinse BID  . Chlorhexidine Gluconate Cloth  6 each Topical Daily  . gabapentin  300 mg Oral BID  . insulin aspart  0-5 Units Subcutaneous QHS  . insulin aspart  0-9 Units Subcutaneous TID WC  . mouth rinse  15 mL Mouth Rinse q12n4p  . methylPREDNISolone (SOLU-MEDROL) injection  80 mg Intravenous Q8H  . pantoprazole (PROTONIX) IV  40 mg Intravenous Q24H  . sodium chloride flush  3 mL Intravenous Q12H  . tamsulosin  0.4 mg Oral Daily   Continuous Infusions: . sodium chloride    . amiodarone 30 mg/hr (05/10/19 1522)  . azithromycin 250 mL/hr at 05/10/19 1522  . ceFEPime (MAXIPIME) IV Stopped (05/10/19 0944)  . diltiazem (CARDIZEM) infusion 15 mg/hr (05/10/19 1522)  . heparin 1,700 Units/hr (05/10/19 1522)  . sulfamethoxazole-trimethoprim Stopped (05/10/19 1146)   PRN Meds:.sodium chloride, acetaminophen, LORazepam, ondansetron **OR** ondansetron (ZOFRAN) IV, sodium chloride, sodium chloride flush  Micro Results Recent Results (from the past 240 hour(s))  SARS CORONAVIRUS 2 (TAT 6-24  HRS) Nasopharyngeal Nasopharyngeal Swab     Status: None   Collection Time: 05/12/2019  7:36 PM   Specimen: Nasopharyngeal Swab  Result Value Ref Range Status   SARS Coronavirus 2 NEGATIVE NEGATIVE Final    Comment: (NOTE) SARS-CoV-2 target nucleic acids are NOT DETECTED. The SARS-CoV-2 RNA is generally detectable in upper and lower respiratory specimens during the acute phase of infection. Negative results do not preclude SARS-CoV-2 infection, do not rule out co-infections with other pathogens, and should not be used as the sole basis for treatment or other patient management decisions. Negative results must be combined with clinical observations, patient history, and epidemiological information. The expected result is Negative. Fact Sheet for Patients: SugarRoll.be Fact Sheet for Healthcare Providers: https://www.woods-mathews.com/ This test is not yet approved or cleared by the [place] FDA and  has been authorized for detection and/or diagnosis of SARS-CoV-2 by FDA under an Emergency Use Authorization (EUA). This EUA will remain  in effect (meaning this test can be used) for the duration of the COVID-19 declaration under Section 56 4(b)(1) of the Act, 21 U.S.C. section 360bbb-3(b)(1), unless the authorization is terminated or revoked sooner. Performed at [facility], [place] [address], [place], [place] [address]   MRSA PCR Screening     Status: None   Collection Time: 05/08/19  3:08 AM   Specimen: Nasal Mucosa; Nasopharyngeal  Result Value Ref Range Status   MRSA by PCR NEGATIVE NEGATIVE Final    Comment:        The GeneXpert MRSA Assay (FDA approved for NASAL specimens only), is one component of a comprehensive MRSA colonization surveillance program. It is not intended to diagnose MRSA infection nor to guide or monitor treatment for MRSA infections. Performed at [facility], [address] [place], [place] [address]    Respiratory Panel by PCR     Status: None   Collection Time: 05/09/19  2:05 PM   Specimen: Nasopharyngeal Swab; Respiratory  Result Value Ref Range Status   Adenovirus NOT DETECTED NOT DETECTED Final   Coronavirus 229E NOT DETECTED NOT DETECTED Final    Comment: (NOTE) The Coronavirus on the Respiratory Panel, DOES NOT test for the novel  Coronavirus (2019 nCoV)    Coronavirus HKU1 NOT DETECTED NOT DETECTED Final   Coronavirus NL63 NOT DETECTED NOT DETECTED Final   Coronavirus OC43 NOT DETECTED NOT DETECTED Final   Metapneumovirus NOT DETECTED NOT DETECTED Final   Rhinovirus / Enterovirus NOT DETECTED NOT  DETECTED Final   Influenza A NOT DETECTED NOT DETECTED Final   Influenza B NOT DETECTED NOT DETECTED Final   Parainfluenza Virus 1 NOT DETECTED NOT DETECTED Final   Parainfluenza Virus 2 NOT DETECTED NOT DETECTED Final   Parainfluenza Virus 3 NOT DETECTED NOT DETECTED Final   Parainfluenza Virus 4 NOT DETECTED NOT DETECTED Final   Respiratory Syncytial Virus NOT DETECTED NOT DETECTED Final   Bordetella pertussis NOT DETECTED NOT DETECTED Final   Chlamydophila pneumoniae NOT DETECTED NOT DETECTED Final   Mycoplasma pneumoniae NOT DETECTED NOT DETECTED Final    Comment: Performed at [facility], [place] [address], [place], [place] [address]  Expectorated sputum assessment w rflx to resp cult     Status: None   Collection Time: 05/09/19  5:30 PM   Specimen: Expectorated Sputum  Result Value Ref Range Status   Specimen Description EXPECTORATED SPUTUM  Final   Special Requests Immunocompromised  Final   Sputum evaluation   Final    THIS SPECIMEN IS ACCEPTABLE FOR SPUTUM CULTURE Performed at [facility], [address], [place], [place] [address]    Report Status 05/09/2019 FINAL  Final  Culture, respiratory     Status: None (Preliminary result)   Collection Time: 05/09/19  5:30 PM  Result Value Ref Range Status   Specimen Description   Final    EXPECTORATED  SPUTUM Performed at [facility], [address] [place], [place] [address]    Special Requests   Final    Immunocompromised Reflexed from [id number] Performed at [facility], [address], [place], [place] [address]    Gram Stain   Final    RARE WBC PRESENT, PREDOMINANTLY PMN RARE GRAM POSITIVE COCCI IN PAIRS RARE BUDDING YEAST SEEN FEW SQUAMOUS EPITHELIAL CELLS PRESENT    Culture   Final    NO GROWTH 1 DAY Performed at [facility], [place] [address], [place], [place] [address]    Report Status PENDING  Incomplete    Radiology Reports DG Chest 2 View  Result Date: 05/14/2019 CLINICAL DATA:  Shortness of breath. EXAM: CHEST - 2 VIEW COMPARISON:  January 26, 2017 FINDINGS: Mild infiltrates are seen within the mid left lung and bilateral lung bases, left greater than right. There is a mild amount of pleural fluid seen along the lateral aspect of the mid right lung. No pneumothorax is identified. The cardiac silhouette is mildly enlarged. The visualized skeletal structures are unremarkable. IMPRESSION: 1. Mild bilateral infiltrates. 2. Small lateral right pleural effusion. A small amount of loculated fluid cannot be excluded. Electronically Signed   By: Virgina Norfolk M.D.   On: 05/23/2019 17:02   CT ANGIO CHEST PE W OR WO CONTRAST  Result Date: 05/08/2019 CLINICAL DATA:  Shortness of breath. EXAM: CT ANGIOGRAPHY CHEST WITH CONTRAST TECHNIQUE: Multidetector CT imaging of the chest was performed using the standard protocol during bolus administration of intravenous contrast. Multiplanar CT image reconstructions and MIPs were obtained to evaluate the vascular anatomy. CONTRAST:  117mL OMNIPAQUE IOHEXOL 350 MG/ML SOLN COMPARISON:  March 30, 2019 FINDINGS: Cardiovascular: There is moderate severity calcification of the thoracic aorta satisfactory opacification of the pulmonary arteries to the segmental level. No evidence of pulmonary embolism. Normal heart size. No pericardial effusion.  Mediastinum/Nodes: There is mild pretracheal lymphadenopathy. Lungs/Pleura: Marked severity diffuse ground-glass appearing infiltrates are seen throughout both lungs. There is no evidence of a pleural effusion or pneumothorax. Upper Abdomen: A stable 2.5 cm x 1.4 cm low-attenuation right adrenal  mass is seen. Musculoskeletal: Multilevel degenerative changes seen throughout the thoracic spine. Review of the MIP images confirms the above findings. IMPRESSION: 1. Marked severity diffuse bilateral ground-glass appearing infiltrates. 2. No evidence of pulmonary embolism. 3. Stable low-attenuation right adrenal mass which may represent an adrenal adenoma. Electronically Signed   By: Virgina Norfolk M.D.   On: 05/08/2019 16:11   DG CHEST PORT 1 VIEW  Result Date: 05/09/2019 CLINICAL DATA:  PICC placement EXAM: PORTABLE CHEST 1 VIEW COMPARISON:  Yesterday FINDINGS: Patchy bilateral pneumonia. There is cardiomegaly and vascular pedicle widening. Right PICC with tip at the SVC. No evidence of effusion or pneumothorax. IMPRESSION: 1. Right-sided PICC in good position. 2. Extensive bilateral pneumonia. Electronically Signed   By: Monte Fantasia M.D.   On: 05/09/2019 14:02   DG Chest Port 1 View  Result Date: 05/08/2019 CLINICAL DATA:  History of lung cancer, oxygen desaturation EXAM: PORTABLE CHEST 1 VIEW COMPARISON:  05/22/2019 FINDINGS: Bilateral interstitial and patchy alveolar airspace opacities. No pleural effusion or pneumothorax. Stable cardiomediastinal silhouette. No aggressive osseous lesion. IMPRESSION: Bilateral patchy interstitial and alveolar airspace opacities as can be seen with multilobar pneumonia. Electronically Signed   By: Kathreen Devoid   On: 05/08/2019 12:14   [place] EKG SITE RITE  Result Date: 05/08/2019 If Site Rite image not attached, placement could not be confirmed due to current cardiac rhythm.    Phillips Climes M.D on 05/10/2019 at 3:37 PM  Between 7am to 7pm - Pager -  [phone]  After 7pm go to [email or web address] - password [facility]  [facility] -  Office  [phone]

## 2019-05-10 NOTE — Progress Notes (Signed)
ANTICOAGULATION CONSULT NOTE - Initial Consult  Pharmacy Consult for Heparin Indication: atrial fibrillation  Allergies  Allergen Reactions  . Ciprofloxacin     HIVES  . Adhesive [Tape] Other (See Comments)    Skin peels  . Codeine Other (See Comments)    Jitters, "skin crawling"    Patient Measurements: Height: 6\' 8"  (203.2 cm) Weight: 275 lb 12.7 oz (125.1 kg) IBW/kg (Calculated) : 96 HEPARIN DW (KG): 121  Vital Signs: Temp: 97.9 F (36.6 C) (02/07 0808) Temp Source: Axillary (02/07 0808) BP: 115/86 (02/07 0900) Pulse Rate: 81 (02/07 0925)  Labs: Recent Labs    05/08/19 0402 05/08/19 0402 05/09/19 0439 05/10/19 0454  HGB 15.7   < > 15.1 14.7  HCT 48.7  --  46.4 44.6  PLT 211  --  183 166  CREATININE 1.01  --  0.88 0.73   < > = values in this interval not displayed.    Estimated Creatinine Clearance: 138.2 mL/min (by C-G formula based on SCr of 0.73 mg/dL).   Medical History: Past Medical History:  Diagnosis Date  . Arthritis   . Atrial flutter ([place])   . BPH (benign prostatic hypertrophy) with urinary retention   . Cancer ([place])   . Chronic kidney disease    hx of kidney stones  2011  . COPD (chronic obstructive pulmonary disease) ([place])    has been smoking since he was 22 ish--  . High cholesterol   . Hypertension    dx around 2011  . Squamous cell carcinoma of right lung (HCC)     Medications:  Medications Prior to Admission  Medication Sig Dispense Refill Last Dose  . acetaminophen (TYLENOL) 500 MG tablet Take 1,000 mg by mouth every 6 (six) hours as needed for moderate pain.   unknown at Unknown time  . atorvastatin (LIPITOR) 10 MG tablet Take 10 mg by mouth every evening.    05/06/2019 at Unknown time  . CARTIA XT 180 MG 24 hr capsule Take 1 capsule by mouth once daily (Patient taking differently: Take 180 mg by mouth daily. ) 90 capsule 0 05/26/2019 at Unknown time  . Cyanocobalamin (VITAMIN B-12 PO) Take 1 tablet by mouth daily.   05/28/2019 at  Unknown time  . famotidine (PEPCID) 10 MG tablet Take 10 mg by mouth as needed for heartburn or indigestion.   05/06/2019 at Unknown time  . furosemide (LASIX) 40 MG tablet Take 1 tablet (40 mg total) by mouth daily as needed. 90 tablet 3 Past Week at Unknown time  . gabapentin (NEURONTIN) 300 MG capsule TAKE 1 CAPSULE BY MOUTH THREE TIMES DAILY (Patient taking differently: Take 300 mg by mouth 2 (two) times daily. ) 90 capsule 0 05/27/2019 at Unknown time  . loratadine (CLARITIN) 10 MG tablet Take 10 mg by mouth daily as needed for allergies.    unknown  . tamsulosin (FLOMAX) 0.4 MG CAPS capsule Take 0.4 mg by mouth daily.   05/29/2019 at Unknown time  . terbinafine ([employer] ATHLETES FOOT) 1 % cream Apply 1 application topically daily.   Past Week at Unknown time  . predniSONE (DELTASONE) 20 MG tablet Take 6 tablets daily for 2 weeks, then take 5 tablets daily for 1 week, then take 4 tablets for 1 week, then take 3 tablets for 1 week, then take 2 tablet for 1 week, then take 1 tablet for 1 week, then take 0.5 tablets for 1 week. 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 D-dimers were elevated, CTA chest with no evidence of PE, but significant for severe interstitial lung disease. Pharmacy asked to anticoagulate with heparin for afib.  Goal of Therapy:  Heparin level 0.3-0.7 units/ml Monitor platelets by anticoagulation protocol: Yes   Plan:  Give 4000 units bolus x 1 Start heparin infusion at 1700 units/hr Check anti-Xa level in ~6-8 hours and daily while on heparin Continue to monitor H&H and platelets  Isac Sarna, BS Vena [place], BCPS Clinical  Pharmacist Pager [phone] 05/10/2019,9:59 AM

## 2019-05-10 NOTE — Progress Notes (Signed)
Patient unable to tolerate removal of Bipap for fluids or PO meds. Paged midlevel, [place], to request holding PO meds due to desaturation into the 70s. Awaiting response/further orders.

## 2019-05-10 NOTE — Progress Notes (Signed)
Notified Physician due to increased work of breathing and continued decreased SPO2 . Order completed Pt now on BiPAP.

## 2019-05-11 ENCOUNTER — Inpatient Hospital Stay (HOSPITAL_COMMUNITY): Payer: Medicare Other

## 2019-05-11 ENCOUNTER — Encounter (HOSPITAL_COMMUNITY): Payer: Self-pay | Admitting: Family Medicine

## 2019-05-11 ENCOUNTER — Inpatient Hospital Stay (HOSPITAL_COMMUNITY): Payer: Medicare Other | Admitting: Anesthesiology

## 2019-05-11 DIAGNOSIS — I4891 Unspecified atrial fibrillation: Secondary | ICD-10-CM

## 2019-05-11 DIAGNOSIS — C3491 Malignant neoplasm of unspecified part of right bronchus or lung: Secondary | ICD-10-CM

## 2019-05-11 DIAGNOSIS — J80 Acute respiratory distress syndrome: Secondary | ICD-10-CM

## 2019-05-11 DIAGNOSIS — J189 Pneumonia, unspecified organism: Secondary | ICD-10-CM

## 2019-05-11 LAB — CBC
HCT: 43 % (ref 39.0–52.0)
Hemoglobin: 14.3 g/dL (ref 13.0–17.0)
MCH: 28.3 pg (ref 26.0–34.0)
MCHC: 33.3 g/dL (ref 30.0–36.0)
MCV: 85 fL (ref 80.0–100.0)
Platelets: 121 10*3/uL — ABNORMAL LOW (ref 150–400)
RBC: 5.06 MIL/uL (ref 4.22–5.81)
RDW: 13.2 % (ref 11.5–15.5)
WBC: 15 10*3/uL — ABNORMAL HIGH (ref 4.0–10.5)
nRBC: 0 % (ref 0.0–0.2)

## 2019-05-11 LAB — HEPARIN LEVEL (UNFRACTIONATED)
Heparin Unfractionated: 0.25 IU/mL — ABNORMAL LOW (ref 0.30–0.70)
Heparin Unfractionated: 0.58 IU/mL (ref 0.30–0.70)
Heparin Unfractionated: 0.6 IU/mL (ref 0.30–0.70)

## 2019-05-11 LAB — BASIC METABOLIC PANEL
Anion gap: 13 (ref 5–15)
BUN: 31 mg/dL — ABNORMAL HIGH (ref 8–23)
CO2: 22 mmol/L (ref 22–32)
Calcium: 8.5 mg/dL — ABNORMAL LOW (ref 8.9–10.3)
Chloride: 94 mmol/L — ABNORMAL LOW (ref 98–111)
Creatinine, Ser: 0.95 mg/dL (ref 0.61–1.24)
GFR calc Af Amer: >60 mL/min (ref >60)
GFR calc non Af Amer: >60 mL/min (ref >60)
Glucose, Bld: 271 mg/dL — ABNORMAL HIGH (ref 70–99)
Potassium: 4.1 mmol/L (ref 3.5–5.1)
Sodium: 129 mmol/L — ABNORMAL LOW (ref 135–145)

## 2019-05-11 LAB — GLUCOSE, CAPILLARY
Glucose-Capillary: 189 mg/dL — ABNORMAL HIGH (ref 70–99)
Glucose-Capillary: 189 mg/dL — ABNORMAL HIGH (ref 70–99)
Glucose-Capillary: 201 mg/dL — ABNORMAL HIGH (ref 70–99)
Glucose-Capillary: 226 mg/dL — ABNORMAL HIGH (ref 70–99)
Glucose-Capillary: 263 mg/dL — ABNORMAL HIGH (ref 70–99)

## 2019-05-11 LAB — BLOOD GAS, ARTERIAL
Acid-Base Excess: 0.2 mmol/L (ref 0.0–2.0)
Acid-base deficit: 1.4 mmol/L (ref 0.0–2.0)
Bicarbonate: 24.7 mmol/L (ref 20.0–28.0)
Bicarbonate: 22.7 mmol/L (ref 20.0–28.0)
FIO2: 80
FIO2: 100
O2 Saturation: 96.6 %
O2 Saturation: 98.6 %
Patient temperature: 36.3
Patient temperature: 36.4
pCO2 arterial: 37.3 mmHg (ref 32.0–48.0)
pCO2 arterial: 47.6 mmHg (ref 32.0–48.0)
pH, Arterial: 7.425 (ref 7.350–7.450)
pH, Arterial: 7.32 — ABNORMAL LOW (ref 7.350–7.450)
pO2, Arterial: 92.9 mmHg (ref 83.0–108.0)
pO2, Arterial: 216 mmHg — ABNORMAL HIGH (ref 83.0–108.0)

## 2019-05-11 LAB — SEDIMENTATION RATE: Sed Rate: 30 mm/hr — ABNORMAL HIGH (ref 0–16)

## 2019-05-11 LAB — C-REACTIVE PROTEIN: CRP: 19.5 mg/dL — ABNORMAL HIGH (ref <1.0)

## 2019-05-11 LAB — SAR COV2 SEROLOGY (COVID19)AB(IGG),IA: SARS-CoV-2 Ab, IgG: NONREACTIVE

## 2019-05-11 MED ORDER — MIDAZOLAM HCL 2 MG/2ML IJ SOLN: 1.0000 mg | INTRAMUSCULAR | Status: DC | PRN

## 2019-05-11 MED ORDER — DEXMEDETOMIDINE HCL IN NACL 400 MCG/100ML IV SOLN
0.0000 ug/kg/h | INTRAVENOUS | Status: AC
  Administered 2019-05-11: 23:00:00 0.6 ug/kg/h via INTRAVENOUS
  Administered 2019-05-11: 0.4 ug/kg/h via INTRAVENOUS
  Administered 2019-05-12: 0.8 ug/kg/h via INTRAVENOUS
  Administered 2019-05-12: 0.6 ug/kg/h via INTRAVENOUS
  Administered 2019-05-12: 0.8 ug/kg/h via INTRAVENOUS
  Administered 2019-05-12: 0.6 ug/kg/h via INTRAVENOUS
  Administered 2019-05-12: 0.8 ug/kg/h via INTRAVENOUS
  Administered 2019-05-13: 1.2 ug/kg/h via INTRAVENOUS
  Administered 2019-05-13: 02:00:00 0.8 ug/kg/h via INTRAVENOUS
  Administered 2019-05-13: 15:00:00 1.1 ug/kg/h via INTRAVENOUS
  Administered 2019-05-13: 09:00:00 1 ug/kg/h via INTRAVENOUS
  Administered 2019-05-13 (×2): 1.1 ug/kg/h via INTRAVENOUS
  Administered 2019-05-13 – 2019-05-14 (×4): 1.2 ug/kg/h via INTRAVENOUS
  Administered 2019-05-14: 1.1 ug/kg/h via INTRAVENOUS
  Administered 2019-05-14: 1.2 ug/kg/h via INTRAVENOUS
  Filled 2019-05-11 (×11): qty 100
  Filled 2019-05-11: qty 200
  Filled 2019-05-11 (×9): qty 100

## 2019-05-11 MED ORDER — MIDAZOLAM HCL 2 MG/2ML IJ SOLN
2.0000 mg | INTRAMUSCULAR | Status: DC | PRN
  Administered 2019-05-11 – 2019-05-14 (×6): 2 mg via INTRAVENOUS
  Filled 2019-05-11 (×6): qty 2

## 2019-05-11 MED ORDER — FENTANYL CITRATE (PF) 100 MCG/2ML IJ SOLN
25.0000 ug | INTRAMUSCULAR | Status: DC | PRN
  Administered 2019-05-11 – 2019-05-14 (×2): 100 ug via INTRAVENOUS
  Administered 2019-05-14: 25 ug via INTRAVENOUS
  Administered 2019-05-14: 50 ug via INTRAVENOUS
  Administered 2019-05-14 – 2019-05-15 (×9): 100 ug via INTRAVENOUS
  Filled 2019-05-11 (×10): qty 2

## 2019-05-11 MED ORDER — MIDAZOLAM HCL 2 MG/2ML IJ SOLN
INTRAMUSCULAR | Status: DC | PRN
  Administered 2019-05-11: 4 mg via INTRAVENOUS

## 2019-05-11 MED ORDER — CHLORHEXIDINE GLUCONATE 0.12% ORAL RINSE (MEDLINE KIT)
15.0000 mL | Freq: Two times a day (BID) | OROMUCOSAL | Status: DC
  Administered 2019-05-11 – 2019-05-15 (×8): 15 mL via OROMUCOSAL

## 2019-05-11 MED ORDER — SODIUM CHLORIDE 0.9% FLUSH
10.0000 mL | Freq: Two times a day (BID) | INTRAVENOUS | Status: DC
  Administered 2019-05-12 – 2019-05-15 (×7): 10 mL via INTRAVENOUS

## 2019-05-11 MED ORDER — METOPROLOL TARTRATE 5 MG/5ML IV SOLN: 2.5000 mg | INTRAVENOUS | Status: DC | PRN

## 2019-05-11 MED ORDER — LORAZEPAM 2 MG/ML IJ SOLN
1.0000 mg | Freq: Once | INTRAMUSCULAR | Status: AC
  Administered 2019-05-11: 1 mg via INTRAVENOUS
  Filled 2019-05-11: qty 1

## 2019-05-11 MED ORDER — FENTANYL CITRATE (PF) 100 MCG/2ML IJ SOLN
25.0000 ug | INTRAMUSCULAR | Status: DC | PRN
  Administered 2019-05-11: 15:00:00 25 ug via INTRAVENOUS
  Filled 2019-05-11: qty 2

## 2019-05-11 MED ORDER — ORAL CARE MOUTH RINSE
15.0000 mL | OROMUCOSAL | Status: DC
  Administered 2019-05-11 – 2019-05-15 (×39): 15 mL via OROMUCOSAL

## 2019-05-11 MED ORDER — FENTANYL BOLUS VIA INFUSION
25.0000 ug | INTRAVENOUS | Status: DC | PRN
  Administered 2019-05-11 – 2019-05-12 (×3): 25 ug via INTRAVENOUS
  Filled 2019-05-11: qty 25

## 2019-05-11 MED ORDER — FENTANYL CITRATE (PF) 100 MCG/2ML IJ SOLN
25.0000 ug | Freq: Once | INTRAMUSCULAR | Status: AC
  Administered 2019-05-11: 15:00:00 25 ug via INTRAVENOUS
  Filled 2019-05-11: qty 2

## 2019-05-11 MED ORDER — SODIUM CHLORIDE 0.9% FLUSH: 10.0000 mL | INTRAVENOUS | Status: DC | PRN

## 2019-05-11 MED ORDER — SUCCINYLCHOLINE CHLORIDE 20 MG/ML IJ SOLN
INTRAMUSCULAR | Status: DC | PRN
  Administered 2019-05-11: 100 mg via INTRAVENOUS

## 2019-05-11 MED ORDER — MIDAZOLAM HCL 2 MG/2ML IJ SOLN
INTRAMUSCULAR | Status: AC
  Filled 2019-05-11: qty 4

## 2019-05-11 MED ORDER — MIDAZOLAM HCL 2 MG/2ML IJ SOLN
2.0000 mg | INTRAMUSCULAR | Status: AC | PRN
  Administered 2019-05-11 (×3): 2 mg via INTRAVENOUS
  Filled 2019-05-11 (×3): qty 2

## 2019-05-11 MED ORDER — MIDAZOLAM HCL 2 MG/2ML IJ SOLN
INTRAMUSCULAR | Status: AC
  Administered 2019-05-11: 14:00:00 4 mg
  Filled 2019-05-11: qty 4

## 2019-05-11 MED ORDER — FENTANYL 2500MCG IN NS 250ML (10MCG/ML) PREMIX INFUSION: 25.0000 ug/h | INTRAVENOUS | Status: DC

## 2019-05-11 MED ORDER — NOREPINEPHRINE 4 MG/250ML-% IV SOLN
0.0000 ug/min | INTRAVENOUS | Status: DC
  Administered 2019-05-11: 2 ug/min via INTRAVENOUS
  Filled 2019-05-11: qty 250

## 2019-05-11 MED ORDER — SODIUM CHLORIDE 0.9 % IV SOLN
2.5000 ug/h | INTRAVENOUS | Status: DC
  Administered 2019-05-11: 16:00:00 2.5 ug/h via INTRAVENOUS
  Filled 2019-05-11 (×2): qty 50

## 2019-05-11 NOTE — Progress Notes (Signed)
Anesthesia was called to intubate the patient in respiratory distress, on BIPAP. Patient's systolic  blood pressures in low 100s, versed 4mg  and succinyl choline 100 mg were given to intubate the patient. AT x1, breath sounds B/L equal, vital signs stable,endorsed to ICU Team. Reviewed Chest XR, ETT in good position.

## 2019-05-11 NOTE — Progress Notes (Signed)
Lama MD paged in regards to patient's anxiety and restlessness. Patient trying to remove Bipap, HR 120-135, saturation 90-91% on Bipap, and respiratory rate 35-45. Continue to monitor and await further orders.

## 2019-05-11 NOTE — Progress Notes (Signed)
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 D-dimers were elevated, CTA chest with no evidence of PE, but significant for severe interstitial lung disease. Pharmacy asked to anticoagulate with heparin for afib. HL remains subtherapeutic, 0.25 units/ml  Goal of Therapy:  Heparin level 0.3-0.7 units/ml Monitor platelets by anticoagulation protocol: Yes   Plan:  Increase heparin infusion to 2250 units/hr Check anti-Xa level in ~6-8 hours and daily while on heparin Continue to monitor H&H and platelets  Thanks for allowing pharmacy to be a part of this patient's care.  Excell Seltzer, PharmD Clinical Pharmacist  05/11/2019,12:55 AM

## 2019-05-11 NOTE — Consult Note (Signed)
PULMONARY / CRITICAL CARE MEDICINE   NAME:  Aaron Sellers, MRN:  [id number], DOB:  04/24/1950, LOS: 4 ADMISSION DATE:  05/12/2019, CONSULTATION DATE:  2/8 REFERRING MD:  [facility]ad , CHIEF COMPLAINT:  resp distress   BRIEF HISTORY:    69 yowm with h/o stage IV NSC with mets to R thigh on [place] presented in osb onset 4 d PRA and admit 2/4 with RAF  Then developed  resp distress and required  intubation pm 2/8 and PCCM asked to see.     HISTORY OF PRESENT ILLNESS   Not able to give hx as intubated  Per chart:  69 y.o. 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 atient states that he has been having shortness of breath with palpitations and at times was lightheaded.   In the ED patient was found to be in A. fib with RVR, started on IV Cardizem but gradually worse 1/8 and required ET and PCCM consulted    SIGNIFICANT PAST MEDICAL HISTORY   DIAGNOSIS: Stage IV (T2a,N3,M1c)non-small cell lung cancer, squamous cell carcinoma presented with right upper lobe lung mass in addition to right hilar and mediastinal adenopathy as well as metastatic disease in the medial right thigh diagnosed in November 2018.  PRIOR THERAPY: 1) Palliative radiotherapy to the right lower extremity mass completed April 09, 2017. 2) Systemic chemotherapy with carboplatin for AUC of 5, paclitaxel 175 mg/M2 and Keytruda 200 mg IV every 3 weeks. First dose March 12, 2017, status post 4 cycles with partial response.  CURRENT THERAPY: Maintenance treatment with single agent Keytruda 200 mg IV every 3 weeks. First cycle June 11, 2017. Status post 35cycles  the patient's scan on 02/06/2019  showedinterval increase in size and number of diffuse bilateral peribronchovascular groundglass  nodularity suspicious for atypical pneumonia/metastatic disease but also could be immunotherapy mediated inflammatory process. He was started on doxycycline and prednisone. Hecompletedthe course of antibiotics.He completed his prednisone taper   SIGNIFICANT EVENTS:  Admet 05/25/2019 Intubate 05/11/19     STUDIES:   CTa 2/5  1. Marked severity diffuse bilateral ground-glass appearing infiltrates. 2. No evidence of pulmonary embolism.     CULTURES:  Sputum 2/6 >>> Covid PCR  2/4 neg  MRSA  PCR 2/5 neg   ANTIBIOTICS:  Zmax  2/5 >>> maxepime 2/5 >>>  LINES/TUBES:  PIC RUE  2/6 >>>   CONSULTANTS:  Cards 2/5    SUBJECTIVE:  Sedated on vent   CONSTITUTIONAL: BP 110/80   Pulse (!) 121   Temp (!) 97.5 F (36.4 C) (Axillary)   Resp (!) 28   Ht '6\' 8"'$  (2.032 m)   Wt 127.7 kg   SpO2 93%   BMI 30.93 kg/m   I/O last 3 completed shifts: In: 6095.4 [P.O.:50; I.V.:1316.9; IV Piggyback:4728.5] Out: 1550 [Urine:1550]    Intake/Output Summary (Last 24 hours) at 05/11/2019 1548 Last data filed at 05/11/2019 0558 Gross per 24 hour  Intake 2754.86 ml  Output 850 ml  Net 1904.86 ml         FiO2 (%):  [75 %-80 %] 80 %  PHYSICAL EXAM: General: sedated on vent/ nad  Neuro:  Moves all ext HEENT:   Oral et  Cardiovascular:  RRR ? Sinus Tach now  Lungs:  Lung with diffuse insp crackles  Abdomen:  Marked obesity  Musculoskeletal:  Warm w/o edema Skin:  No skin breakdown  cxr  2/8 1. Endotracheal tube in good position. 2. NG tube not identified. 3. Bilateral patchy airspace disease   RESOLVED PROBLEM LIST   ASSESSMENT AND PLAN    1) acute on chronic respiratory failure now vent dp ddx:  Miscellaneous:Alv microlithiasis, alv proteinosis, asp, bronchiectais, BOOP   ARDS/ AIP Occupational dz/ HSP Neoplasm> lymphangitic carcinomatosis less likely based on CT 05/08/19  Infection> agree cover for CAP/ also check covid serology to see if could have been involved  previously Drug:   Nat Math prior to admit now amiododarone on the top of the list of usual suspects > since back in Sinus Tach rec d/c amiodarone and just use lopressor prn IV and stop bronchodilators  Pulmonary emboli, Protein disorders Edema/Eosinophilic dz>  bnp ok, no eos  Sarcoidosis Connective tissue dz Hist X / Hemorrhage Idiopathic   2) stage IV sq cell ca s airlway involvement  - holding further rx    SUMMARY OF TODAY'S PLAN:  High dose steroids > continue but check esr to see if can help guide dosing  D/c amiodarone Try ards protocol if tolerates   Best Practice / Goals of Care / Disposition.   DVT PROPHYLAXIS: hep drip SUP: protonix IV  NUTRITION:NPO for now  MOBILITY:bed rest  GOALS OF CARE: Full code for now FAMILY DISCUSSIONS: with wife at bedside DISPOSITION ICU  LABS  Glucose Recent Labs  Lab 05/10/19 0809 05/10/19 1139 05/10/19 1650 05/10/19 2108 05/11/19 0813 05/11/19 1130  GLUCAP 178* 238* 197* 237* 226* 263*    BMET Recent Labs  Lab 05/09/19 0439 05/10/19 0454 05/11/19 0417  NA 135 133* 129*  K 3.5 3.9 4.1  CL 98 95* 94*  CO2 '24 24 22  '$ BUN 22 25* 31*  CREATININE 0.88 0.73 0.95  GLUCOSE 208* 181* 271*    Liver Enzymes Recent Labs  Lab 05/13/2019 1719 05/08/19 0402  AST 31 38  ALT 38 40  ALKPHOS 85 73  BILITOT 1.9* 2.9*  ALBUMIN 3.4* 3.2*    Electrolytes Recent Labs  Lab 05/08/19 0402 05/08/19 1159 05/09/19 0439 05/10/19 0454 05/11/19 0417  CALCIUM   < >  --  8.8* 8.6* 8.5*  MG  --  2.0  --   --   --    < > = values in this interval not displayed.    CBC Recent Labs  Lab 05/09/19 0439 05/10/19 0454 05/11/19 0417  WBC 11.0* 18.6* 15.0*  HGB 15.1 14.7 14.3  HCT 46.4 44.6 43.0  PLT 183 166 121*    ABG Recent Labs  Lab 05/11/19 0906  PHART 7.425  PCO2ART 37.3  PO2ART 92.9    Coag's No results for input(s): APTT, INR in the last 168 hours.  Sepsis Markers Recent Labs  Lab 05/08/19 1159 05/09/19 0439  05/10/19 0454  PROCALCITON 0.29 0.22 0.24    Cardiac Enzymes No results for input(s): TROPONINI, PROBNP in the last 168 hours.  PAST MEDICAL HISTORY :   He  has a past medical history of Arthritis, Atrial flutter ([place]), BPH (benign prostatic hypertrophy) with urinary retention, Cancer ([place]), Chronic kidney disease, COPD (chronic obstructive pulmonary disease) ([place]), High cholesterol, Hypertension, and Squamous cell carcinoma of right lung ([place]).  PAST SURGICAL HISTORY:  He  has a past surgical history that includes Hernia repair; Incision and drainage abscess; and Total knee arthroplasty (Left, 10/29/2014).  Allergies  Allergen Reactions  . Ciprofloxacin     HIVES  .  Adhesive [Tape] Other (See Comments)    Skin peels  . Codeine Other (See Comments)    Jitters, "skin crawling"    Current Facility-Administered Medications on File Prior to Encounter  Medication  . heparin lock flush 100 unit/mL  . sodium chloride flush (NS) 0.9 % injection 10 mL   Current Outpatient Medications on File Prior to Encounter  Medication Sig  . acetaminophen (TYLENOL) 500 MG tablet Take 1,000 mg by mouth every 6 (six) hours as needed for moderate pain.  Marland Kitchen atorvastatin (LIPITOR) 10 MG tablet Take 10 mg by mouth every evening.   Marland Kitchen CARTIA XT 180 MG 24 hr capsule Take 1 capsule by mouth once daily (Patient taking differently: Take 180 mg by mouth daily. )  . Cyanocobalamin (VITAMIN B-12 PO) Take 1 tablet by mouth daily.  . famotidine (PEPCID) 10 MG tablet Take 10 mg by mouth as needed for heartburn or indigestion.  . furosemide (LASIX) 40 MG tablet Take 1 tablet (40 mg total) by mouth daily as needed.  . gabapentin (NEURONTIN) 300 MG capsule TAKE 1 CAPSULE BY MOUTH THREE TIMES DAILY (Patient taking differently: Take 300 mg by mouth 2 (two) times daily. )  . loratadine (CLARITIN) 10 MG tablet Take 10 mg by mouth daily as needed for allergies.   . tamsulosin (FLOMAX) 0.4 MG CAPS capsule Take 0.4 mg by mouth  daily.  Marland Kitchen terbinafine ([employer] ATHLETES FOOT) 1 % cream Apply 1 application topically daily.  . predniSONE (DELTASONE) 20 MG tablet Take 6 tablets daily for 2 weeks, then take 5 tablets daily for 1 week, then take 4 tablets for 1 week, then take 3 tablets for 1 week, then take 2 tablet for 1 week, then take 1 tablet for 1 week, then take 0.5 tablets for 1 week. (Patient not taking: Reported on 05/19/2019)    FAMILY HISTORY:   His family history includes CVA in his father; Hypertension in his brother, mother, and sister; Lung cancer in his brother and sister.  SOCIAL HISTORY:  He  reports that he quit smoking about 2 years ago. His smoking use included cigarettes. He has a 40.00 pack-year smoking history. He has quit using smokeless tobacco.  His smokeless tobacco use included snuff. He reports that he does not drink alcohol or use drugs.    The patient is critically ill with multiple organ systems failure and requires high complexity decision making for assessment and support, frequent evaluation and titration of therapies, application of advanced monitoring technologies and extensive interpretation of multiple databases. Critical Care Time devoted to patient care services described in this note is 45 minutes.    Christinia Gully, MD Pulmonary and [place] [phone] After 5:30 PM or weekends, use [facility] [phone]

## 2019-05-11 NOTE — Progress Notes (Signed)
Progress Note  Patient Name: Aaron Sellers Date of Encounter: 05/11/2019  Primary Cardiologist: Carlyle Dolly, MD  Subjective   Currently on BiPAP.  Inpatient Medications    Scheduled Meds: . atorvastatin  10 mg Oral QPM  . chlorhexidine  15 mL Mouth Rinse BID  . Chlorhexidine Gluconate Cloth  6 each Topical Daily  . gabapentin  300 mg Oral BID  . insulin aspart  0-5 Units Subcutaneous QHS  . insulin aspart  0-9 Units Subcutaneous TID WC  . ipratropium  0.5 mg Nebulization Q6H  . levalbuterol  0.63 mg Nebulization Q6H  . mouth rinse  15 mL Mouth Rinse q12n4p  . methylPREDNISolone (SOLU-MEDROL) injection  80 mg Intravenous Q8H  . pantoprazole (PROTONIX) IV  40 mg Intravenous Q24H  . sodium chloride flush  3 mL Intravenous Q12H  . tamsulosin  0.4 mg Oral Daily   Continuous Infusions: . sodium chloride    . amiodarone 30 mg/hr (05/11/19 0407)  . azithromycin Stopped (05/10/19 1601)  . ceFEPime (MAXIPIME) IV Stopped (05/11/19 0132)  . diltiazem (CARDIZEM) infusion 15 mg/hr (05/11/19 0407)  . heparin 2,250 Units/hr (05/11/19 0407)  . sulfamethoxazole-trimethoprim Stopped (05/11/19 0337)   PRN Meds: sodium chloride, acetaminophen, LORazepam, ondansetron **OR** ondansetron (ZOFRAN) IV, sodium chloride, sodium chloride flush   Vital Signs    Vitals:   05/11/19 0715 05/11/19 0730 05/11/19 0745 05/11/19 0810  BP: 105/88 104/86 103/76   Pulse: (!) 127 (!) 159 76 (!) 108  Resp: (!) 38 (!) 42 (!) 28 (!) 28  Temp:    (!) 97.3 F (36.3 C)  TempSrc:    Axillary  SpO2:   91%   Weight:      Height:        Intake/Output Summary (Last 24 hours) at 05/11/2019 0855 Last data filed at 05/11/2019 0558 Gross per 24 hour  Intake 4016.54 ml  Output 1150 ml  Net 2866.54 ml   Filed Weights   05/08/19 0400 05/10/19 0500 05/11/19 0500  Weight: 123.3 kg 125.1 kg 127.7 kg    Telemetry    Atrial fibrillation.  Personally reviewed.  ECG    An ECG dated 05/04/2019 was personally  reviewed today and demonstrated:  Rapid atrial fibrillation with intermittent PVCs versus aberrantly conducted complexes.  Physical Exam   GEN:  Patient on BiPAP, tachypneic.   Neck: No obvious JVD. Cardiac:  Irregularly irregular, no gallop. Respiratory:  Scattered rhonchi with decreased breath sounds throughout. GI:  Protuberant, nontender, bowel sounds present. MS:  Mild ankle; No deformity. Neuro:  Nonfocal. Psych: Alert and oriented x 3.  Labs    Chemistry Recent Labs  Lab 05/22/2019 1719 05/30/2019 1719 05/08/19 0402 05/08/19 0402 05/09/19 0439 05/10/19 0454 05/11/19 0417  NA 134*   < > 134*   < > 135 133* 129*  K 3.8   < > 4.8   < > 3.5 3.9 4.1  CL 98   < > 96*   < > 98 95* 94*  CO2 24   < > 23   < > 24 24 22   GLUCOSE 136*   < > 147*   < > 208* 181* 271*  BUN 17   < > 17   < > 22 25* 31*  CREATININE 0.81   < > 1.01   < > 0.88 0.73 0.95  CALCIUM 8.7*   < > 8.8*   < > 8.8* 8.6* 8.5*  PROT 7.1  --  7.3  --   --   --   --  ALBUMIN 3.4*  --  3.2*  --   --   --   --   AST 31  --  38  --   --   --   --   ALT 38  --  40  --   --   --   --   ALKPHOS 85  --  73  --   --   --   --   BILITOT 1.9*  --  2.9*  --   --   --   --   GFRNONAA >60   < > >60   < > >60 >60 >60  GFRAA >60   < > >60   < > >60 >60 >60  ANIONGAP 12   < > 15   < > 13 14 13    < > = values in this interval not displayed.     Hematology Recent Labs  Lab 05/09/19 0439 05/10/19 0454 05/11/19 0417  WBC 11.0* 18.6* 15.0*  RBC 5.38 5.26 5.06  HGB 15.1 14.7 14.3  HCT 46.4 44.6 43.0  MCV 86.2 84.8 85.0  MCH 28.1 27.9 28.3  MCHC 32.5 33.0 33.3  RDW 13.4 13.2 13.2  PLT 183 166 121*    BNP Recent Labs  Lab 05/31/2019 1720  BNP 104.0*     DDimer Recent Labs  Lab 05/08/19 1159  DDIMER 2.11*     Radiology    DG CHEST PORT 1 VIEW  Result Date: 05/09/2019 CLINICAL DATA:  PICC placement EXAM: PORTABLE CHEST 1 VIEW COMPARISON:  Yesterday FINDINGS: Patchy bilateral pneumonia. There is cardiomegaly  and vascular pedicle widening. Right PICC with tip at the SVC. No evidence of effusion or pneumothorax. IMPRESSION: 1. Right-sided PICC in good position. 2. Extensive bilateral pneumonia. Electronically Signed   By: Monte Fantasia M.D.   On: 05/09/2019 14:02    Cardiac Studies   Echocardiogram 06/01/2017: Study Conclusions   - Left ventricle: The cavity size was mildly dilated. Wall  thickness was increased in a pattern of mild LVH. Systolic  function was normal. The estimated ejection fraction was in the  range of 55% to 60%. Wall motion was normal; there were no  regional wall motion abnormalities. The study is not technically  sufficient to allow evaluation of LV diastolic function.  - Mitral valve: There was mild regurgitation.  - Right ventricle: The cavity size was mildly dilated.  - Right atrium: The atrium was mildly dilated.   Impressions:   - Normal LV systolic function; mild LVH and LVE; mild MR; mild RAE  and RVE; mild TR.   Patient Profile     69 y.o. male with a history of hyperlipidemia, stage IV lung cancer, COPD, atrial fibrillation (previously not anticoagulated with CHA2DS2-VASc or of 1, lung cancer, and history of bowel perforation).  Assessment & Plan    1.  Persistent atrial fibrillation with RVR, known history of paroxysmal atrial fibrillation.  Currently on combination of IV diltiazem and IV amiodarone with heart rate 100-110 range (generally improved).  Started on heparin infusion on February 7.  He was not anticoagulated at baseline as noted above.  2.  Acute hypoxic respiratory, presently on BiPAP with further Pulmonary consultation pending.  Patient has diffuse pulmonary infiltrates and is currently being managed empirically on broad-spectrum antibiotic therapy, also suspicion of Keytruda pneumonitis and placed on steroids.  SARS coronavirus 2 test is negative, chest CTA also negative for pulmonary embolus.  3.  Stage IV squamous cell carcinoma  of  the right lung status post palliative radiotherapy and systemic chemotherapy.  Discussed case with Dr. Waldron Labs.  Pulmonary consultation pending, there is concern that he will ultimately require mechanical ventilation given current respiratory status.  Agree with current rate control strategy, although ideally amiodarone is not a good long-term choice.  Continue heparin in case he requires more urgent cardioversion (if he were to become unstable), however I would generally defer on a more elective attempt at cardioversion given high likelihood of recurrent atrial fibrillation in the current setting.  We will continue to follow.  Signed, Rozann Lesches, MD  05/11/2019, 8:55 AM

## 2019-05-11 NOTE — Anesthesia Procedure Notes (Signed)
Procedure Name: Intubation Date/Time: 05/11/2019 2:08 PM Performed by: Denese Killings, MD Pre-anesthesia Checklist: Patient identified, Emergency Drugs available, Suction available and Patient being monitored Patient Re-evaluated:Patient Re-evaluated prior to induction Oxygen Delivery Method: Ambu bag and Supernova nasal CPAP Preoxygenation: Pre-oxygenation with 100% oxygen Induction Type: IV induction Laryngoscope Size: Glidescope Grade View: Grade I Tube type: Oral Number of attempts: 1 Airway Equipment and Method: Stylet and Oral airway Placement Confirmation: ETT inserted through vocal cords under direct vision,  positive ETCO2 and breath sounds checked- equal and bilateral Secured at: 24 cm Tube secured with: Tape Dental Injury: Teeth and Oropharynx as per pre-operative assessment

## 2019-05-11 NOTE — Progress Notes (Signed)
Anesthesiologist, Hospitalist, RN x 3 and RT at bedside for intubation.   Pt given 4mg  IV versed at 1405 and 100mg  IV succinylcholine 1406.  7.5 ETT placed at 24cm/lip at 1408 by Anesthesia va glidescope. Positive color change on CO2 detector, breath sounds auscultated over all lung fields.  Pt given additional 4mg  Versed IV at 1419 and 100mg  fentynal.

## 2019-05-11 NOTE — Progress Notes (Signed)
ANTICOAGULATION CONSULT NOTE -   Pharmacy Consult for Heparin Indication: atrial fibrillation  Allergies  Allergen Reactions  . Ciprofloxacin     HIVES  . Adhesive [Tape] Other (See Comments)    Skin peels  . Codeine Other (See Comments)    Jitters, "skin crawling"    Patient Measurements: Height: 6\' 8"  (203.2 cm) Weight: 281 lb 8.4 oz (127.7 kg) IBW/kg (Calculated) : 96 HEPARIN DW (KG): 121  Vital Signs: Temp: 97.3 F (36.3 C) (02/08 0810) Temp Source: Axillary (02/08 0810) BP: 108/80 (02/08 0845) Pulse Rate: 129 (02/08 0845)  Labs: Recent Labs    05/09/19 0439 05/09/19 0439 05/10/19 0454 05/10/19 1648 05/11/19 0007 05/11/19 0417 05/11/19 0829  HGB 15.1   < > 14.7  --   --  14.3  --   HCT 46.4  --  44.6  --   --  43.0  --   PLT 183  --  166  --   --  121*  --   HEPARINUNFRC  --   --   --  0.24* 0.25*  --  0.60  CREATININE 0.88  --  0.73  --   --  0.95  --    < > = values in this interval not displayed.    Estimated Creatinine Clearance: 117.6 mL/min (by C-G formula based on SCr of 0.95 mg/dL).   Medical History: Past Medical History:  Diagnosis Date  . Arthritis   . Atrial flutter ([place])   . BPH (benign prostatic hypertrophy) with urinary retention   . Cancer ([place])   . Chronic kidney disease    hx of kidney stones  2011  . COPD (chronic obstructive pulmonary disease) ([place])    has been smoking since he was 22 ish--  . High cholesterol   . Hypertension    dx around 2011  . Squamous cell carcinoma of right lung ([facility])     Medications:  Medications Prior to Admission  Medication Sig Dispense Refill Last Dose  . acetaminophen (TYLENOL) 500 MG tablet Take 1,000 mg by mouth every 6 (six) hours as needed for moderate pain.   unknown at Unknown time  . atorvastatin (LIPITOR) 10 MG tablet Take 10 mg by mouth every evening.    05/06/2019 at Unknown time  . CARTIA XT 180 MG 24 hr capsule Take 1 capsule by mouth once daily (Patient taking differently: Take 180  mg by mouth daily. ) 90 capsule 0 05/05/2019 at Unknown time  . Cyanocobalamin (VITAMIN B-12 PO) Take 1 tablet by mouth daily.   05/25/2019 at Unknown time  . famotidine (PEPCID) 10 MG tablet Take 10 mg by mouth as needed for heartburn or indigestion.   05/06/2019 at Unknown time  . furosemide (LASIX) 40 MG tablet Take 1 tablet (40 mg total) by mouth daily as needed. 90 tablet 3 Past Week at Unknown time  . gabapentin (NEURONTIN) 300 MG capsule TAKE 1 CAPSULE BY MOUTH THREE TIMES DAILY (Patient taking differently: Take 300 mg by mouth 2 (two) times daily. ) 90 capsule 0 05/09/2019 at Unknown time  . loratadine (CLARITIN) 10 MG tablet Take 10 mg by mouth daily as needed for allergies.    unknown  . tamsulosin (FLOMAX) 0.4 MG CAPS capsule Take 0.4 mg by mouth daily.   05/18/2019 at Unknown time  . terbinafine ([employer] ATHLETES FOOT) 1 % cream Apply 1 application topically daily.   Past Week at Unknown time  . predniSONE (DELTASONE) 20 MG tablet Take 6 tablets daily  for 2 weeks, then take 5 tablets daily for 1 week, then take 4 tablets for 1 week, then take 3 tablets for 1 week, then take 2 tablet for 1 week, then take 1 tablet for 1 week, then take 0.5 tablets for 1 week. 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 D-dimers were elevated, CTA chest with no evidence of PE, but significant for severe interstitial lung disease. Pharmacy asked to anticoagulate with heparin for afib. HL is 0.6, therapeutic  Goal of Therapy:  Heparin level 0.3-0.7 units/ml Monitor platelets by anticoagulation protocol: Yes   Plan:  Continue heparin infusion to 2250 units/hr Check  anti-Xa level in ~6-8 hours and daily while on heparin Continue to monitor H&H and platelets  Isac Sarna, BS [place], BCPS Clinical Pharmacist Pager [phone] 05/11/2019,9:32 AM

## 2019-05-11 NOTE — Progress Notes (Signed)
Bladder scanned patient, resulting 216 in bladder. Patient In and Out cathed per protocol. Continue to monitor.

## 2019-05-11 NOTE — Progress Notes (Signed)
PROGRESS NOTE                                                                                                                                                                                                             Patient Demographics:    Aaron Sellers, is a 69 y.o. male, DOB - Mar 07, 1951, NLG:[id number]  Admit date - 05/08/2019   Admitting Physician Oswald Hillock, MD  Outpatient Primary MD for the patient is Janece Canterbury  LOS - 4   Chief Complaint  Patient presents with  . Shortness of Breath       Brief Narrative    69 y.o. 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  Patient states that he has been having shortness of breath with palpitations and at times was lightheaded.  Denies any chest pain. He was found to be in A. fib with RVR, admitted to stepdown for heart rate control on Cardizem drip, as well patient had progressive hypoxia within first 24 hours on admission, which has escalated rapidly, he D-dimers were elevated, CTA chest with no evidence of PE, but significant for severe interstitial lung disease, high clinical suspicion for Keytruda pneumonitis, received infliximab 2/7 patient with increased work of breathing on BiPAP, more lethargic, required intubation 2/8.    Subjective:    Aaron Sellers today is more lethargic and obtunded this morning, more confused, .   Assessment  & Plan :    Active Problems:   Atrial fibrillation with RVR (HCC)  Acute hypoxic respiratory failure. -Patient with rapid increase of oxygen requirement, on BiPAP for last 24 hours, this morning is more lethargic, increased work of breathing, he required  intubation . -No clear etiology, infectious versus inflammatory . -Highly suspicious for Keytruda pneumonitis, no improvement with IV steroids, received infliximab 5 mg/kg x1 dose.  2/7 continue with IV Solu-Medrol at 80 mg IV every 8 hours.  -Procalcitonin 0.24, which is reassuring. -Patient empirically on IV vancomycin, cefepime, and azithromycin, I have discussed with PCCM, patient has been on steroids for 5 weeks, will need coverage for atypical  infections, continue with azithromycin, cefepime, will DC vancomycin, will start on IV Bactrim treatment dose till PCP is ruled out (at this point patient is unstable for bronchoscopy given high oxygen requirement ). -Patient encouraged with incentive spirometry, and flutter valve . -No evidence of volume overload . -follow Fungitell , LDH is elevated, viral panel is negative possibly related to infectious etiology, versus inflammatory etiology. -D-dimer is elevated, but CTA chest is negative for PE. -COVID-19 negative.  Follow on COVID-19 serology. -Vent management and sedation per PCCM  COVID-19 Labs  Recent Labs    05/09/19 0440 05/09/19 1431 05/10/19 0454 05/11/19 0417  LDH  --  285*  --   --   CRP 31.8*  --  17.6* 19.5*    Lab Results  Component Value Date   SARSCOV2NAA NEGATIVE 05/14/2019   A. fib with RVR -Management per cardiology, patient on Cardizem drip, transitioned to amiodarone given soft blood pressure (will keep on amiodarone only for short period given his lung disease), back on Cardizem drip 2/7 given significant RVR . -Amiodarone stopped 2/8  -Started on heparin GTT for anticoagulation.  Stage IV squamous cell carcinoma - patient followed by oncology as outpatient.   Code Status : Full(reports he is okay with intubation for short period of time, but does not want to be kept alive on life support)  Family Communication  : Discussed with patient significant other with his permission, they have been partner for more  than 30 years, she is being updated daily,   Disposition Plan  : Remains ICU  Barriers For Discharge :  on amiodarone drip, IV steroids, IV antibiotics and significant oxygen requirement  Consults  : Cardiology, PCCM, Oncology  Procedures  :  2/6 PICC line, 2/6 ET tube  DVT Prophylaxis  :  Heparin GTT  Lab Results  Component Value Date   PLT 121 (L) 05/11/2019    Antibiotics  :    Anti-infectives (From admission, onward)   Start     Dose/Rate Route Frequency Ordered Stop   05/09/19 1500  sulfamethoxazole-trimethoprim (BACTRIM) 400.8 mg in dextrose 5 % 500 mL IVPB     15 mg/kg/day  106.9 kg (Adjusted) 350 mL/hr over 90 Minutes Intravenous Every 6 hours 05/09/19 1428     05/09/19 0800  vancomycin (VANCOREADY) IVPB 1250 mg/250 mL  Status:  Discontinued     1,250 mg 166.7 mL/hr over 90 Minutes Intravenous Every 12 hours 05/08/19 1725 05/09/19 1412   05/08/19 1900  vancomycin (VANCOREADY) IVPB 2000 mg/400 mL     2,000 mg 200 mL/hr over 120 Minutes Intravenous  Once 05/08/19 1720 05/08/19 2145   05/08/19 1800  ceFEPIme (MAXIPIME) 2 g in sodium chloride 0.9 % 100 mL IVPB     2 g 200 mL/hr over 30 Minutes Intravenous Every 8 hours 05/08/19 1719     05/08/19 1445  azithromycin (ZITHROMAX) 500 mg in sodium chloride 0.9 % 250 mL IVPB     500 mg 250 mL/hr over 60 Minutes Intravenous Every 24 hours 05/08/19 1440     05/08/19 1445  cefTRIAXone (ROCEPHIN) 2 g in sodium chloride 0.9 % 100 mL IVPB  Status:  Discontinued     2 g 200 mL/hr over 30 Minutes Intravenous Every 24 hours 05/08/19 1440 05/08/19 1719        Objective:   Vitals:   05/11/19 1408 05/11/19 1549 05/11/19 1619 05/11/19 1658  BP:      Pulse:   (!) 117 (!) 119  Resp:  20 (!) 23  Temp:   97.6 F (36.4 C)   TempSrc:   Axillary   SpO2:  96%    Weight:      Height: '6\' 8"'$  (2.032 m)       Wt Readings from Last 3 Encounters:  05/11/19 127.7 kg  04/01/19 129.5 kg  03/19/19 129.2 kg     Intake/Output Summary  (Last 24 hours) at 05/11/2019 1721 Last data filed at 05/11/2019 0558 Gross per 24 hour  Intake 2297.65 ml  Output 625 ml  Net 1672.65 ml     Physical Exam  Sedated, intubated, in no apparent distress Symmetrical Chest wall movement, ventilated respiratory sounds Irr Irr ,No Gallops,Rubs or new Murmurs, No Parasternal Heave +ve B.Sounds, Abd Soft, No tenderness, No rebound - guarding or rigidity. No Cyanosis, Clubbing or edema, No new Rash or bruise     Data Review:    CBC Recent Labs  Lab 05/11/2019 1719 05/08/19 0402 05/09/19 0439 05/10/19 0454 05/11/19 0417  WBC 12.1* 12.5* 11.0* 18.6* 15.0*  HGB 16.0 15.7 15.1 14.7 14.3  HCT 48.5 48.7 46.4 44.6 43.0  PLT 229 211 183 166 121*  MCV 86.6 87.3 86.2 84.8 85.0  MCH 28.6 28.1 28.1 27.9 28.3  MCHC 33.0 32.2 32.5 33.0 33.3  RDW 13.4 13.7 13.4 13.2 13.2  LYMPHSABS 1.1  --   --   --   --   MONOABS 0.4  --   --   --   --   EOSABS 0.0  --   --   --   --   BASOSABS 0.0  --   --   --   --     Chemistries  Recent Labs  Lab 05/21/2019 1719 05/08/19 0402 05/08/19 1159 05/09/19 0439 05/10/19 0454 05/11/19 0417  NA 134* 134*  --  135 133* 129*  K 3.8 4.8  --  3.5 3.9 4.1  CL 98 96*  --  98 95* 94*  CO2 24 23  --  '24 24 22  '$ GLUCOSE 136* 147*  --  208* 181* 271*  BUN 17 17  --  22 25* 31*  CREATININE 0.81 1.01  --  0.88 0.73 0.95  CALCIUM 8.7* 8.8*  --  8.8* 8.6* 8.5*  MG  --   --  2.0  --   --   --   AST 31 38  --   --   --   --   ALT 38 40  --   --   --   --   ALKPHOS 85 73  --   --   --   --   BILITOT 1.9* 2.9*  --   --   --   --    ------------------------------------------------------------------------------------------------------------------ No results for input(s): CHOL, HDL, LDLCALC, TRIG, CHOLHDL, LDLDIRECT in the last 72 hours.  Lab Results  Component Value Date   HGBA1C 7.0 (H) 05/08/2019   ------------------------------------------------------------------------------------------------------------------ No  results for input(s): TSH, T4TOTAL, T3FREE, THYROIDAB in the last 72 hours.  Invalid input(s): FREET3 ------------------------------------------------------------------------------------------------------------------ No results for input(s): VITAMINB12, FOLATE, FERRITIN, TIBC, IRON, RETICCTPCT in the last 72 hours.  Coagulation profile No results for input(s): INR, PROTIME in the last 168 hours.  No results for input(s): DDIMER in the last 72 hours.  Cardiac Enzymes No results for input(s): CKMB, TROPONINI, MYOGLOBIN in the last 168 hours.  Invalid input(s): CK ------------------------------------------------------------------------------------------------------------------    Component Value Date/Time   BNP 104.0 (H) 05/17/2019 1720    Inpatient  Medications  Scheduled Meds: . atorvastatin  10 mg Oral QPM  . chlorhexidine gluconate (MEDLINE KIT)  15 mL Mouth Rinse BID  . Chlorhexidine Gluconate Cloth  6 each Topical Daily  . gabapentin  300 mg Oral BID  . insulin aspart  0-5 Units Subcutaneous QHS  . insulin aspart  0-9 Units Subcutaneous TID WC  . mouth rinse  15 mL Mouth Rinse 10 times per day  . methylPREDNISolone (SOLU-MEDROL) injection  80 mg Intravenous Q8H  . pantoprazole (PROTONIX) IV  40 mg Intravenous Q24H  . sodium chloride flush  3 mL Intravenous Q12H  . tamsulosin  0.4 mg Oral Daily   Continuous Infusions: . sodium chloride    . azithromycin 500 mg (05/11/19 1440)  . ceFEPime (MAXIPIME) IV 2 g (05/11/19 1128)  . diltiazem (CARDIZEM) infusion 15 mg/hr (05/11/19 0952)  . fentaNYL 10 mcg/ml infusion 2.5 mcg/hr (05/11/19 1537)  . heparin 2,250 Units/hr (05/11/19 0955)  . sulfamethoxazole-trimethoprim 400.8 mg (05/11/19 1636)   PRN Meds:.sodium chloride, acetaminophen, fentaNYL, fentaNYL (SUBLIMAZE) injection, fentaNYL (SUBLIMAZE) injection, LORazepam, metoprolol tartrate, midazolam, midazolam, midazolam, midazolam, ondansetron **OR** ondansetron (ZOFRAN) IV,  sodium chloride, sodium chloride flush  Micro Results Recent Results (from the past 240 hour(s))  SARS CORONAVIRUS 2 (TAT 6-24 HRS) Nasopharyngeal Nasopharyngeal Swab     Status: None   Collection Time: 05/10/2019  7:36 PM   Specimen: Nasopharyngeal Swab  Result Value Ref Range Status   SARS Coronavirus 2 NEGATIVE NEGATIVE Final    Comment: (NOTE) SARS-CoV-2 target nucleic acids are NOT DETECTED. The SARS-CoV-2 RNA is generally detectable in upper and lower respiratory specimens during the acute phase of infection. Negative results do not preclude SARS-CoV-2 infection, do not rule out co-infections with other pathogens, and should not be used as the sole basis for treatment or other patient management decisions. Negative results must be combined with clinical observations, patient history, and epidemiological information. The expected result is Negative. Fact Sheet for Patients: SugarRoll.be Fact Sheet for Healthcare Providers: https://www.woods-mathews.com/ This test is not yet approved or cleared by the [place] FDA and  has been authorized for detection and/or diagnosis of SARS-CoV-2 by FDA under an Emergency Use Authorization (EUA). This EUA will remain  in effect (meaning this test can be used) for the duration of the COVID-19 declaration under Section 56 4(b)(1) of the Act, 21 U.S.C. section 360bbb-3(b)(1), unless the authorization is terminated or revoked sooner. Performed at [facility], [place] [address], [place], [place] [address]   MRSA PCR Screening     Status: None   Collection Time: 05/08/19  3:08 AM   Specimen: Nasal Mucosa; Nasopharyngeal  Result Value Ref Range Status   MRSA by PCR NEGATIVE NEGATIVE Final    Comment:        The GeneXpert MRSA Assay (FDA approved for NASAL specimens only), is one component of a comprehensive MRSA colonization surveillance program. It is not intended to diagnose MRSA infection  nor to guide or monitor treatment for MRSA infections. Performed at [facility], [address] [place], [place] [address]   Respiratory Panel by PCR     Status: None   Collection Time: 05/09/19  2:05 PM   Specimen: Nasopharyngeal Swab; Respiratory  Result Value Ref Range Status   Adenovirus NOT DETECTED NOT DETECTED Final   Coronavirus 229E NOT DETECTED NOT DETECTED Final    Comment: (NOTE) The Coronavirus on the Respiratory Panel, DOES NOT test for the novel  Coronavirus (2019 nCoV)    Coronavirus  HKU1 NOT DETECTED NOT DETECTED Final   Coronavirus NL63 NOT DETECTED NOT DETECTED Final   Coronavirus OC43 NOT DETECTED NOT DETECTED Final   Metapneumovirus NOT DETECTED NOT DETECTED Final   Rhinovirus / Enterovirus NOT DETECTED NOT DETECTED Final   Influenza A NOT DETECTED NOT DETECTED Final   Influenza B NOT DETECTED NOT DETECTED Final   Parainfluenza Virus 1 NOT DETECTED NOT DETECTED Final   Parainfluenza Virus 2 NOT DETECTED NOT DETECTED Final   Parainfluenza Virus 3 NOT DETECTED NOT DETECTED Final   Parainfluenza Virus 4 NOT DETECTED NOT DETECTED Final   Respiratory Syncytial Virus NOT DETECTED NOT DETECTED Final   Bordetella pertussis NOT DETECTED NOT DETECTED Final   Chlamydophila pneumoniae NOT DETECTED NOT DETECTED Final   Mycoplasma pneumoniae NOT DETECTED NOT DETECTED Final    Comment: Performed at [facility], [place] [address] [place], [place] [address]  Expectorated sputum assessment w rflx to resp cult     Status: None   Collection Time: 05/09/19  5:30 PM   Specimen: Expectorated Sputum  Result Value Ref Range Status   Specimen Description EXPECTORATED SPUTUM  Final   Special Requests Immunocompromised  Final   Sputum evaluation   Final    THIS SPECIMEN IS ACCEPTABLE FOR SPUTUM CULTURE Performed at [facility], [address] [place], [place] [address]    Report Status 05/09/2019 FINAL  Final  Culture, respiratory     Status: None (Preliminary result)    Collection Time: 05/09/19  5:30 PM  Result Value Ref Range Status   Specimen Description   Final    EXPECTORATED SPUTUM Performed at [facility], [address], [place], [place] [address]    Special Requests   Final    Immunocompromised Reflexed from [phone] Performed at [facility], [address] [place], [place] [address]    Gram Stain   Final    RARE WBC PRESENT, PREDOMINANTLY PMN RARE GRAM POSITIVE COCCI IN PAIRS RARE BUDDING YEAST SEEN FEW SQUAMOUS EPITHELIAL CELLS PRESENT Performed at [facility], [place] [address] [place], [place] [address]    Culture CULTURE REINCUBATED FOR BETTER GROWTH  Final   Report Status PENDING  Incomplete    Radiology Reports DG Chest 2 View  Result Date: 05/13/2019 CLINICAL DATA:  Shortness of breath. EXAM: CHEST - 2 VIEW COMPARISON:  January 26, 2017 FINDINGS: Mild infiltrates are seen within the mid left lung and bilateral lung bases, left greater than right. There is a mild amount of pleural fluid seen along the lateral aspect of the mid right lung. No pneumothorax is identified. The cardiac silhouette is mildly enlarged. The visualized skeletal structures are unremarkable. IMPRESSION: 1. Mild bilateral infiltrates. 2. Small lateral right pleural effusion. A small amount of loculated fluid cannot be excluded. Electronically Signed   By: Virgina Norfolk M.D.   On: 05/26/2019 17:02   DG Abd 1 View  Result Date: 05/11/2019 CLINICAL DATA:  Enteric catheter placement EXAM: ABDOMEN - 1 VIEW COMPARISON:  05/11/2019 FINDINGS: Two frontal views of the left upper quadrant of the abdomen are obtained. No enteric catheter is identified. The bowel gas pattern is unremarkable. IMPRESSION: 1. No enteric catheter is identified. Electronically Signed   By: Randa Ngo M.D.   On: 05/11/2019 17:14   CT ANGIO CHEST PE W OR WO CONTRAST  Result Date: 05/08/2019 CLINICAL DATA:  Shortness of breath. EXAM: CT ANGIOGRAPHY CHEST WITH CONTRAST TECHNIQUE:  Multidetector CT imaging of the chest was performed using the  standard protocol during bolus administration of intravenous contrast. Multiplanar CT image reconstructions and MIPs were obtained to evaluate the vascular anatomy. CONTRAST:  130m OMNIPAQUE IOHEXOL 350 MG/ML SOLN COMPARISON:  March 30, 2019 FINDINGS: Cardiovascular: There is moderate severity calcification of the thoracic aorta satisfactory opacification of the pulmonary arteries to the segmental level. No evidence of pulmonary embolism. Normal heart size. No pericardial effusion. Mediastinum/Nodes: There is mild pretracheal lymphadenopathy. Lungs/Pleura: Marked severity diffuse ground-glass appearing infiltrates are seen throughout both lungs. There is no evidence of a pleural effusion or pneumothorax. Upper Abdomen: A stable 2.5 cm x 1.4 cm low-attenuation right adrenal mass is seen. Musculoskeletal: Multilevel degenerative changes seen throughout the thoracic spine. Review of the MIP images confirms the above findings. IMPRESSION: 1. Marked severity diffuse bilateral ground-glass appearing infiltrates. 2. No evidence of pulmonary embolism. 3. Stable low-attenuation right adrenal mass which may represent an adrenal adenoma. Electronically Signed   By: TVirgina NorfolkM.D.   On: 05/08/2019 16:11   DG CHEST PORT 1 VIEW  Result Date: 05/11/2019 CLINICAL DATA:  Et tube and ng tube placement EXAM: PORTABLE CHEST 1 VIEW COMPARISON:  Radiograph 05/09/2019 FINDINGS: Endotracheal tube 4.6 cm from carina. RIGHT PICC line tip in distal SVC. No NG tube identified. Stable enlarged cardiac silhouette. Bilateral patchy airspace disease. IMPRESSION: 1. Endotracheal tube in good position. 2. NG tube not identified. 3. Bilateral patchy airspace disease Electronically Signed   By: SSuzy BouchardM.D.   On: 05/11/2019 14:52   DG CHEST PORT 1 VIEW  Result Date: 05/09/2019 CLINICAL DATA:  PICC placement EXAM: PORTABLE CHEST 1 VIEW COMPARISON:  Yesterday  FINDINGS: Patchy bilateral pneumonia. There is cardiomegaly and vascular pedicle widening. Right PICC with tip at the SVC. No evidence of effusion or pneumothorax. IMPRESSION: 1. Right-sided PICC in good position. 2. Extensive bilateral pneumonia. Electronically Signed   By: JMonte FantasiaM.D.   On: 05/09/2019 14:02   DG Chest Port 1 View  Result Date: 05/08/2019 CLINICAL DATA:  History of lung cancer, oxygen desaturation EXAM: PORTABLE CHEST 1 VIEW COMPARISON:  05/08/2019 FINDINGS: Bilateral interstitial and patchy alveolar airspace opacities. No pleural effusion or pneumothorax. Stable cardiomediastinal silhouette. No aggressive osseous lesion. IMPRESSION: Bilateral patchy interstitial and alveolar airspace opacities as can be seen with multilobar pneumonia. Electronically Signed   By: HKathreen Devoid  On: 05/08/2019 12:14   [place] SITE RITE  Result Date: 05/08/2019 If Site Rite image not attached, placement could not be confirmed due to current cardiac rhythm.    DPhillips ClimesM.D on 05/11/2019 at 5:21 PM  Between 7am to 7pm - Pager - [phone] After 7pm go to [email or web address] - password [email or web address] -  Office  [phone]

## 2019-05-11 NOTE — Progress Notes (Addendum)
Upon arrival to shift Patient sats were 70%s. Sedation increased and patient repositioned. RT called to assess. Sats now 95-99%. Patient BP soft and decreasing with increases in sedation medications, Levophed initiated and titrated as needed. Patient was in Afib at shift arrival now having periods of vent bigeminy. MD, Darrick Meigs, paged. Continue to monitor.

## 2019-05-11 NOTE — Progress Notes (Signed)
RRT called for dropping O2 saturations. RRT advised this RN to administer O2 breaths and they will be by to assess patient.

## 2019-05-11 NOTE — Consult Note (Signed)
[facility] Consultation Oncology  Name: Aaron Sellers      MRN: [id number]    Location: IC07/IC07-01  Date: 05/11/2019 Time:6:16 PM   REFERRING PHYSICIAN: Dr. Waldron Labs  REASON FOR CONSULT: Stage IV non-small cell lung cancer   DIAGNOSIS: Immunotherapy related pneumonitis  HISTORY OF PRESENT ILLNESS: Aaron Sellers is a 69 year old very pleasant white male who is seen in consultation today at the request of Dr. 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  He has stage IV non-small cell lung cancer and was receiving Keytruda monotherapy, last dose on 03/02/2019 under the direction of Dr. Julien Nordmann.  A CT scan on 03/30/2019 showed groundglass attenuation involving peribronchovascular interstitium throughout both lungs which has increased compared to prior exam in November.  There are multiple irregular configured solid and groundglass nodules noted throughout both lungs.  Some of these nodules exhibited central cavitation.  At that time he was started on prednisone 120 mg daily and was tapered over 6 weeks.  His breathing got progressively worse in the last 2 weeks as per his wife at bedside.  Patient came to the ER on 05/08/2019 and a CT of the chest PE protocol showed marked diffuse groundglass appearing infiltrates throughout both lungs.  No evidence of pulmonary embolism.  As his breathing has gotten worse, he was initially placed on BiPAP and was intubated today afternoon.  He is on high-dose IV steroids.  He is also receiving IV antibiotics for possible infectious causes.  PAST MEDICAL HISTORY:   Past Medical History:  Diagnosis Date  . Arthritis   . Atrial flutter ([place])   . BPH (benign prostatic hypertrophy) with urinary retention   . Cancer ([place])   . Chronic kidney disease    hx of kidney stones  2011  . COPD (chronic obstructive pulmonary disease) ([place])    has been smoking since he was 22 ish--  . High cholesterol   . Hypertension     dx around 2011  . Squamous cell carcinoma of right lung (HCC)     ALLERGIES: Allergies  Allergen Reactions  . Ciprofloxacin     HIVES  . Adhesive [Tape] Other (See Comments)    Skin peels  . Codeine Other (See Comments)    Jitters, "skin crawling"      MEDICATIONS: I have reviewed the patient's current medications.     PAST SURGICAL HISTORY Past Surgical History:  Procedure Laterality Date  . HERNIA REPAIR     umbilical hernia  05/2949  . INCISION AND DRAINAGE ABSCESS     "down the middle" of his buttocks  2015  . TOTAL KNEE ARTHROPLASTY Left 10/29/2014   Procedure: TOTAL KNEE ARTHROPLASTY;  Surgeon: Dorna Leitz, MD;  Location: [place];  Service: Orthopedics;  Laterality: Left;    FAMILY HISTORY: Family History  Problem Relation Age of Onset  . Hypertension Mother   . CVA Father   . Lung cancer Sister   . Lung cancer Brother   . Hypertension Brother   . Hypertension Sister     SOCIAL HISTORY:  reports that he quit smoking about 2 years ago. His smoking use included cigarettes. He has a 40.00 pack-year smoking history. He has quit using smokeless tobacco.  His smokeless tobacco use included snuff. He reports that he does not drink alcohol or use drugs.  PERFORMANCE STATUS: The patient's performance status is 3 - Symptomatic, >50% confined to bed  PHYSICAL EXAM: Most Recent Vital Signs: Blood pressure 93/74, pulse (!) 120,  temperature 97.6 F (36.4 C), temperature source Axillary, resp. rate (!) 22, height 6\' 8"  (2.032 m), weight 281 lb 8.4 oz (127.7 kg), SpO2 92 %. BP 93/74   Pulse (!) 120   Temp 97.6 F (36.4 C) (Axillary)   Resp (!) 22   Ht 6\' 8"  (2.032 m)   Wt 281 lb 8.4 oz (127.7 kg)   SpO2 92%   BMI 30.93 kg/m  General appearance: alert, cooperative and appears stated age Neck: no adenopathy and supple, symmetrical, trachea midline Lungs: Bilateral  air entry. Heart: regular rate and rhythm Abdomen: soft, non-tender; bowel sounds normal; no masses,  no  organomegaly Extremities: extremities normal, atraumatic, no cyanosis or edema Skin: Skin color, texture, turgor normal. No rashes or lesions Lymph nodes: Cervical, supraclavicular, and axillary nodes normal. Neurologic: Grossly normal  LABORATORY DATA:  Results for orders placed or performed during the hospital encounter of 05/19/2019 (from the past 48 hour(s))  Glucose, capillary     Status: Abnormal   Collection Time: 05/09/19  9:03 PM  Result Value Ref Range   Glucose-Capillary 218 (H) 70 - 99 mg/dL  Procalcitonin     Status: None   Collection Time: 05/10/19  4:54 AM  Result Value Ref Range   Procalcitonin 0.24 ng/mL    Comment:        Interpretation: PCT (Procalcitonin) <= 0.5 ng/mL: Systemic infection (sepsis) is not likely. Local bacterial infection is possible. (NOTE)       Sepsis PCT Algorithm           Lower Respiratory Tract                                      Infection PCT Algorithm    ----------------------------     ----------------------------         PCT < 0.25 ng/mL                PCT < 0.10 ng/mL         Strongly encourage             Strongly discourage   discontinuation of antibiotics    initiation of antibiotics    ----------------------------     -----------------------------       PCT 0.25 - 0.50 ng/mL            PCT 0.10 - 0.25 ng/mL               OR       >80% decrease in PCT            Discourage initiation of                                            antibiotics      Encourage discontinuation           of antibiotics    ----------------------------     -----------------------------         PCT >= 0.50 ng/mL              PCT 0.26 - 0.50 ng/mL               AND        <80% decrease in PCT             Encourage  initiation of                                             antibiotics       Encourage continuation           of antibiotics    ----------------------------     -----------------------------        PCT >= 0.50 ng/mL                  PCT > 0.50  ng/mL               AND         increase in PCT                  Strongly encourage                                      initiation of antibiotics    Strongly encourage escalation           of antibiotics                                     -----------------------------                                           PCT <= 0.25 ng/mL                                                 OR                                        > 80% decrease in PCT                                     Discontinue / Do not initiate                                             antibiotics Performed at [facility], [address] [place], [place] [address]   CBC     Status: Abnormal   Collection Time: 05/10/19  4:54 AM  Result Value Ref Range   WBC 18.6 (H) 4.0 - 10.5 K/uL   RBC 5.26 4.22 - 5.81 MIL/uL   Hemoglobin 14.7 13.0 - 17.0 g/dL   HCT 44.6 39.0 - 52.0 %   MCV 84.8 80.0 - 100.0 fL   MCH 27.9 26.0 - 34.0 pg   MCHC 33.0 30.0 - 36.0 g/dL   RDW 13.2 11.5 - 15.5 %   Platelets 166 150 - 400 K/uL   nRBC 0.0 0.0 - 0.2 %    Comment: Performed at [facility], [address]  [place], [place] [address]  Basic metabolic panel     Status: Abnormal   Collection Time: 05/10/19  4:54 AM  Result Value Ref Range   Sodium 133 (L) 135 - 145 mmol/L   Potassium 3.9 3.5 - 5.1 mmol/L   Chloride 95 (L) 98 - 111 mmol/L   CO2 24 22 - 32 mmol/L   Glucose, Bld 181 (H) 70 - 99 mg/dL   BUN 25 (H) 8 - 23 mg/dL   Creatinine, Ser 0.73 0.61 - 1.24 mg/dL   Calcium 8.6 (L) 8.9 - 10.3 mg/dL   GFR calc non Af Amer >60 >60 mL/min   GFR calc Af Amer >60 >60 mL/min   Anion gap 14 5 - 15    Comment: Performed at [facility], [address], [place], [place] [address]  C-reactive protein     Status: Abnormal   Collection Time: 05/10/19  4:54 AM  Result Value Ref Range   CRP 17.6 (H) <1.0 mg/dL    Comment: Performed at [facility], [address] [place], [place] [address]  Glucose, capillary     Status: Abnormal   Collection Time:  05/10/19  8:09 AM  Result Value Ref Range   Glucose-Capillary 178 (H) 70 - 99 mg/dL  Glucose, capillary     Status: Abnormal   Collection Time: 05/10/19 11:39 AM  Result Value Ref Range   Glucose-Capillary 238 (H) 70 - 99 mg/dL  Heparin level (unfractionated)     Status: Abnormal   Collection Time: 05/10/19  4:48 PM  Result Value Ref Range   Heparin Unfractionated 0.24 (L) 0.30 - 0.70 IU/mL    Comment: (NOTE) If heparin results are below expected values, and patient dosage has  been confirmed, suggest follow up testing of antithrombin III levels. Performed at [facility] [address] [place], [place] [address]   Glucose, capillary     Status: Abnormal   Collection Time: 05/10/19  4:50 PM  Result Value Ref Range   Glucose-Capillary 197 (H) 70 - 99 mg/dL  Glucose, capillary     Status: Abnormal   Collection Time: 05/10/19  9:08 PM  Result Value Ref Range   Glucose-Capillary 237 (H) 70 - 99 mg/dL  Heparin level (unfractionated)     Status: Abnormal   Collection Time: 05/11/19 12:07 AM  Result Value Ref Range   Heparin Unfractionated 0.25 (L) 0.30 - 0.70 IU/mL    Comment: (NOTE) If heparin results are below expected values, and patient dosage has  been confirmed, suggest follow up testing of antithrombin III levels. Performed at [facility], [address] [place], [place] [address]   CBC     Status: Abnormal   Collection Time: 05/11/19  4:17 AM  Result Value Ref Range   WBC 15.0 (H) 4.0 - 10.5 K/uL   RBC 5.06 4.22 - 5.81 MIL/uL   Hemoglobin 14.3 13.0 - 17.0 g/dL   HCT 43.0 39.0 - 52.0 %   MCV 85.0 80.0 - 100.0 fL   MCH 28.3 26.0 - 34.0 pg   MCHC 33.3 30.0 - 36.0 g/dL   RDW 13.2 11.5 - 15.5 %   Platelets 121 (L) 150 - 400 K/uL   nRBC 0.0 0.0 - 0.2 %    Comment: Performed at [facility], [address] [place], [place] [address]  Basic metabolic panel     Status: Abnormal   Collection Time: 05/11/19  4:17 AM  Result Value Ref Range   Sodium 129 (L) 135 - 145 mmol/L  Potassium 4.1 3.5 - 5.1 mmol/L   Chloride 94 (L) 98 - 111 mmol/L   CO2 22 22 - 32 mmol/L   Glucose, Bld 271 (H) 70 - 99 mg/dL   BUN 31 (H) 8 - 23 mg/dL   Creatinine, Ser 0.95 0.61 - 1.24 mg/dL   Calcium 8.5 (L) 8.9 - 10.3 mg/dL   GFR calc non Af Amer >60 >60 mL/min   GFR calc Af Amer >60 >60 mL/min   Anion gap 13 5 - 15    Comment: Performed at [facility], [address], [place], [place] [address]  C-reactive protein     Status: Abnormal   Collection Time: 05/11/19  4:17 AM  Result Value Ref Range   CRP 19.5 (H) <1.0 mg/dL    Comment: Performed at [facility], [address] [place], [place] [address]  Glucose, capillary     Status: Abnormal   Collection Time: 05/11/19  8:13 AM  Result Value Ref Range   Glucose-Capillary 226 (H) 70 - 99 mg/dL  Heparin level (unfractionated)     Status: None   Collection Time: 05/11/19  8:29 AM  Result Value Ref Range   Heparin Unfractionated 0.60 0.30 - 0.70 IU/mL    Comment: (NOTE) If heparin results are below expected values, and patient dosage has  been confirmed, suggest follow up testing of antithrombin III levels. Performed at [facility], [address], [place], [place] [address]   Blood gas, arterial     Status: None   Collection Time: 05/11/19  9:06 AM  Result Value Ref Range   FIO2 80.00    pH, Arterial 7.425 7.350 - 7.450   pCO2 arterial 37.3 32.0 - 48.0 mmHg   pO2, Arterial 92.9 83.0 - 108.0 mmHg   Bicarbonate 24.7 20.0 - 28.0 mmol/L   Acid-Base Excess 0.2 0.0 - 2.0 mmol/L   O2 Saturation 96.6 %   Patient temperature 36.3    Allens test (pass/fail) PASS PASS    Comment: Performed at [facility], [address] [place], [place] [address]  Glucose, capillary     Status: Abnormal   Collection Time: 05/11/19 11:30 AM  Result Value Ref Range   Glucose-Capillary 263 (H) 70 - 99 mg/dL  Blood gas, arterial     Status: Abnormal   Collection Time: 05/11/19  3:46 PM  Result Value Ref Range   FIO2 100.00    pH, Arterial 7.320  (L) 7.350 - 7.450   pCO2 arterial 47.6 32.0 - 48.0 mmHg   pO2, Arterial 216 (H) 83.0 - 108.0 mmHg   Bicarbonate 22.7 20.0 - 28.0 mmol/L   Acid-base deficit 1.4 0.0 - 2.0 mmol/L   O2 Saturation 98.6 %   Patient temperature 36.4    Allens test (pass/fail) PASS PASS    Comment: Performed at [facility], [address], [place], [place] [address]  Glucose, capillary     Status: Abnormal   Collection Time: 05/11/19  4:17 PM  Result Value Ref Range   Glucose-Capillary 189 (H) 70 - 99 mg/dL  Heparin level (unfractionated)     Status: None   Collection Time: 05/11/19  4:18 PM  Result Value Ref Range   Heparin Unfractionated 0.58 0.30 - 0.70 IU/mL    Comment: (NOTE) If heparin results are below expected values, and patient dosage has  been confirmed, suggest follow up testing of antithrombin III levels. Performed at [facility], [address] [place], [place] [address]   Sedimentation rate  Status: Abnormal   Collection Time: 05/11/19  4:18 PM  Result Value Ref Range   Sed Rate 30 (H) 0 - 16 mm/hr    Comment: Performed at [facility], [address] [place],  [address]      RADIOGRAPHY: DG Abd 1 View  Result Date: 05/11/2019 CLINICAL DATA:  Enteric catheter placement EXAM: ABDOMEN - 1 VIEW COMPARISON:  05/11/2019 FINDINGS: Two frontal views of the left upper quadrant of the abdomen are obtained. No enteric catheter is identified. The bowel gas pattern is unremarkable. IMPRESSION: 1. No enteric catheter is identified. Electronically Signed   By: Randa Ngo M.D.   On: 05/11/2019 17:14   DG CHEST PORT 1 VIEW  Result Date: 05/11/2019 CLINICAL DATA:  Et tube and ng tube placement EXAM: PORTABLE CHEST 1 VIEW COMPARISON:  Radiograph 05/09/2019 FINDINGS: Endotracheal tube 4.6 cm from carina. RIGHT PICC line tip in distal SVC. No NG tube identified. Stable enlarged cardiac silhouette. Bilateral patchy airspace disease. IMPRESSION: 1. Endotracheal tube in good position. 2. NG tube not  identified. 3. Bilateral patchy airspace disease Electronically Signed   By: Suzy Bouchard M.D.   On: 05/11/2019 14:52         ASSESSMENT and PLAN:  1.  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 -Systemic therapy with carboplatin, paclitaxel and Keytruda from 03/12/2017 for 4 cycles with partial response followed by maintenance treatment with Keytruda 200 mg IV every 3 weeks, last treatment was on 03/02/2019. -CT scan on 03/30/2019 showed groundglass attenuation in volume peribronchovascular interstitium throughout both lungs with multiple irregular solid and groundglass nodules noted in both lungs, some of them are cavitary. -He was started on tapering dose of prednisone at 120 mg on 04/01/2019. -He reported worsening of his breathing in the last 2 weeks per wife. -Repeat CT scan on 05/08/2019 showed markedly worsened diffuse groundglass appearing infiltrates throughout both lungs.  Negative for pulmonary embolism.  COVID-19 was negative. -He was admitted to the hospital and was treated with IV antibiotics (Vanco DC'd, cefepime, azithromycin, Bactrim) and IV steroids.  He was initially on BIPAP and intubated today afternoon. -He received first dose of infliximab 5 mg/kg on 05/10/2019. -Would strongly recommend bronchoscopy with BAL and transbronchial biopsy.  2.  Persistent A. fib with RVR: -Currently on combination IV diltiazem and amiodarone. -He is also on IV heparin.  All questions were answered. The patient knows to call the clinic with any problems, questions or concerns. We can certainly see the patient much sooner if necessary.    Derek Jack

## 2019-05-12 ENCOUNTER — Inpatient Hospital Stay (HOSPITAL_COMMUNITY): Payer: Medicare Other

## 2019-05-12 DIAGNOSIS — Z95828 Presence of other vascular implants and grafts: Secondary | ICD-10-CM

## 2019-05-12 DIAGNOSIS — Z978 Presence of other specified devices: Secondary | ICD-10-CM

## 2019-05-12 DIAGNOSIS — J80 Acute respiratory distress syndrome: Secondary | ICD-10-CM | POA: Diagnosis present

## 2019-05-12 DIAGNOSIS — Z7189 Other specified counseling: Secondary | ICD-10-CM

## 2019-05-12 DIAGNOSIS — Z515 Encounter for palliative care: Secondary | ICD-10-CM

## 2019-05-12 LAB — COMPREHENSIVE METABOLIC PANEL
ALT: 48 U/L — ABNORMAL HIGH (ref 0–44)
AST: 20 U/L (ref 15–41)
Albumin: 2.5 g/dL — ABNORMAL LOW (ref 3.5–5.0)
Alkaline Phosphatase: 115 U/L (ref 38–126)
Anion gap: 9 (ref 5–15)
BUN: 39 mg/dL — ABNORMAL HIGH (ref 8–23)
CO2: 24 mmol/L (ref 22–32)
Calcium: 8.1 mg/dL — ABNORMAL LOW (ref 8.9–10.3)
Chloride: 94 mmol/L — ABNORMAL LOW (ref 98–111)
Creatinine, Ser: 1.04 mg/dL (ref 0.61–1.24)
GFR calc Af Amer: >60 mL/min (ref >60)
GFR calc non Af Amer: >60 mL/min (ref >60)
Glucose, Bld: 253 mg/dL — ABNORMAL HIGH (ref 70–99)
Potassium: 5.2 mmol/L — ABNORMAL HIGH (ref 3.5–5.1)
Sodium: 127 mmol/L — ABNORMAL LOW (ref 135–145)
Total Bilirubin: 0.7 mg/dL (ref 0.3–1.2)
Total Protein: 6 g/dL — ABNORMAL LOW (ref 6.5–8.1)

## 2019-05-12 LAB — CBC
HCT: 40.9 % (ref 39.0–52.0)
Hemoglobin: 13.4 g/dL (ref 13.0–17.0)
MCH: 27.6 pg (ref 26.0–34.0)
MCHC: 32.8 g/dL (ref 30.0–36.0)
MCV: 84.3 fL (ref 80.0–100.0)
Platelets: 128 10*3/uL — ABNORMAL LOW (ref 150–400)
RBC: 4.85 MIL/uL (ref 4.22–5.81)
RDW: 13.2 % (ref 11.5–15.5)
WBC: 14.5 10*3/uL — ABNORMAL HIGH (ref 4.0–10.5)
nRBC: 0 % (ref 0.0–0.2)

## 2019-05-12 LAB — CULTURE, RESPIRATORY W GRAM STAIN: Culture: NORMAL

## 2019-05-12 LAB — GLUCOSE, CAPILLARY
Glucose-Capillary: 193 mg/dL — ABNORMAL HIGH (ref 70–99)
Glucose-Capillary: 214 mg/dL — ABNORMAL HIGH (ref 70–99)
Glucose-Capillary: 226 mg/dL — ABNORMAL HIGH (ref 70–99)
Glucose-Capillary: 168 mg/dL — ABNORMAL HIGH (ref 70–99)
Glucose-Capillary: 185 mg/dL — ABNORMAL HIGH (ref 70–99)

## 2019-05-12 LAB — HEPARIN LEVEL (UNFRACTIONATED)
Heparin Unfractionated: 0.71 IU/mL — ABNORMAL HIGH (ref 0.30–0.70)
Heparin Unfractionated: 0.61 IU/mL (ref 0.30–0.70)

## 2019-05-12 LAB — C-REACTIVE PROTEIN: CRP: 13.8 mg/dL — ABNORMAL HIGH (ref <1.0)

## 2019-05-12 MED ORDER — METOPROLOL TARTRATE 5 MG/5ML IV SOLN
2.5000 mg | Freq: Three times a day (TID) | INTRAVENOUS | Status: DC
  Administered 2019-05-12 – 2019-05-15 (×9): 2.5 mg via INTRAVENOUS
  Filled 2019-05-12 (×9): qty 5

## 2019-05-12 MED ORDER — DIGOXIN 0.25 MG/ML IJ SOLN
0.5000 mg | Freq: Once | INTRAMUSCULAR | Status: AC
  Administered 2019-05-12: 09:00:00 0.5 mg via INTRAVENOUS
  Filled 2019-05-12: qty 2

## 2019-05-12 MED ORDER — DIGOXIN 0.25 MG/ML IJ SOLN
0.2500 mg | Freq: Four times a day (QID) | INTRAMUSCULAR | Status: AC
  Administered 2019-05-12 (×2): 0.25 mg via INTRAVENOUS
  Filled 2019-05-12 (×2): qty 2

## 2019-05-12 NOTE — Progress Notes (Signed)
Follow up visit after a discussion yesterday with Holley Raring about Mr Pridgen's desire to complete HCPOA paperwork. He was resting and on CPAP support. She easily shared their long term relationship as "soulmates for 30 years." We discussed his intent to name her as HCPOA, along with his current legal decision makers as his son and daughter. Affirmed their combined support and discussion and at that point the thought was that Mr Lorusso was able to speak for himself. We did have conversation about end of life decisions regarding life prolonging measure and affirmed to consider what his desires would be in that case.  Today, Mariana Kaufman was in discussion with Holley Raring about goals of care. She graciously invited me into their conversation. Holley Raring openly shared who Mr Sunday is as a kind, caring, faithful, patient person, their deep family bonds and commitments to one another. She shared about his most recent changes which were gradual and now apparent as she was able to review his lung scans. She appears to be appropriately processing these changes and using her faith and commitment to be there for him and supportive to whom she knows him to be. We prayed for their support as they all take next steps in this piece of their journey. Will follow up for support.

## 2019-05-12 NOTE — Progress Notes (Signed)
ANTICOAGULATION CONSULT NOTE -   Pharmacy Consult for Heparin Indication: atrial fibrillation  Allergies  Allergen Reactions  . Ciprofloxacin     HIVES  . Adhesive [Tape] Other (See Comments)    Skin peels  . Codeine Other (See Comments)    Jitters, "skin crawling"    Patient Measurements: Height: 6\' 8"  (203.2 cm) Weight: 280 lb 3.3 oz (127.1 kg) IBW/kg (Calculated) : 96 HEPARIN DW (KG): 121  Vital Signs: Temp: 97.5 F (36.4 C) (02/09 1201) Temp Source: Axillary (02/09 1201) BP: 87/64 (02/09 1300) Pulse Rate: 80 (02/09 1300)  Labs: Recent Labs    05/10/19 0454 05/10/19 1648 05/11/19 0417 05/11/19 0829 05/11/19 1618 05/12/19 0409 05/12/19 1326  HGB 14.7  --  14.3  --   --  13.4  --   HCT 44.6  --  43.0  --   --  40.9  --   PLT 166  --  121*  --   --  128*  --   HEPARINUNFRC  --    < >  --    < > 0.58 0.71* 0.61  CREATININE 0.73  --  0.95  --   --  1.04  --    < > = values in this interval not displayed.    Estimated Creatinine Clearance: 107.1 mL/min (by C-G formula based on SCr of 1.04 mg/dL).   Medical History: Past Medical History:  Diagnosis Date  . Arthritis   . Atrial flutter ([place])   . BPH (benign prostatic hypertrophy) with urinary retention   . Cancer ([place])   . Chronic kidney disease    hx of kidney stones  2011  . COPD (chronic obstructive pulmonary disease) ([place])    has been smoking since he was 22 ish--  . High cholesterol   . Hypertension    dx around 2011  . Squamous cell carcinoma of right lung (HCC)     Medications:  Medications Prior to Admission  Medication Sig Dispense Refill Last Dose  . acetaminophen (TYLENOL) 500 MG tablet Take 1,000 mg by mouth every 6 (six) hours as needed for moderate pain.   unknown at Unknown time  . atorvastatin (LIPITOR) 10 MG tablet Take 10 mg by mouth every evening.    05/06/2019 at Unknown time  . CARTIA XT 180 MG 24 hr capsule Take 1 capsule by mouth once daily (Patient taking differently: Take 180 mg  by mouth daily. ) 90 capsule 0 05/06/2019 at Unknown time  . Cyanocobalamin (VITAMIN B-12 PO) Take 1 tablet by mouth daily.   05/18/2019 at Unknown time  . famotidine (PEPCID) 10 MG tablet Take 10 mg by mouth as needed for heartburn or indigestion.   05/06/2019 at Unknown time  . furosemide (LASIX) 40 MG tablet Take 1 tablet (40 mg total) by mouth daily as needed. 90 tablet 3 Past Week at Unknown time  . gabapentin (NEURONTIN) 300 MG capsule TAKE 1 CAPSULE BY MOUTH THREE TIMES DAILY (Patient taking differently: Take 300 mg by mouth 2 (two) times daily. ) 90 capsule 0 05/24/2019 at Unknown time  . loratadine (CLARITIN) 10 MG tablet Take 10 mg by mouth daily as needed for allergies.    unknown  . tamsulosin (FLOMAX) 0.4 MG CAPS capsule Take 0.4 mg by mouth daily.   05/17/2019 at Unknown time  . terbinafine ([employer] ATHLETES FOOT) 1 % cream Apply 1 application topically daily.   Past Week at Unknown time  . predniSONE (DELTASONE) 20 MG tablet Take 6 tablets  daily for 2 weeks, then take 5 tablets daily for 1 week, then take 4 tablets for 1 week, then take 3 tablets for 1 week, then take 2 tablet for 1 week, then take 1 tablet for 1 week, then take 0.5 tablets for 1 week. 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 D-dimers were elevated, CTA chest with no evidence of PE, but significant for severe interstitial lung disease. Pharmacy asked to anticoagulate with heparin for afib. HL is 0.61, therapeutic  Goal of Therapy:  Heparin level 0.3-0.7 units/ml Monitor platelets by anticoagulation protocol: Yes   Plan:  Continue heparin infusion at 2150 units/hr Check  anti-Xa leve daily while on heparin Continue to monitor H&H and platelets  Margot Ables, PharmD Clinical Pharmacist 05/12/2019 2:52 PM

## 2019-05-12 NOTE — Progress Notes (Signed)
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     HISTORY OF PRESENT ILLNESS   Not able to give hx as intubated  Per chart:  69 y.o. 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 atient states that he has been having shortness of breath with palpitations and at times was lightheaded.   In the ED patient was found to be in A. fib with RVR, started on IV Cardizem but gradually worse 1/8 and required ET and PCCM consulted    SIGNIFICANT PAST MEDICAL HISTORY   DIAGNOSIS: Stage IV (T2a,N3,M1c)non-small cell lung cancer, squamous cell carcinoma presented with right upper lobe lung mass in addition to right hilar and mediastinal adenopathy as well as metastatic disease in the medial right thigh diagnosed in November 2018.  PRIOR THERAPY: 1) Palliative radiotherapy to the right lower extremity mass completed April 09, 2017. 2) Systemic chemotherapy with carboplatin for AUC of 5, paclitaxel 175 mg/M2 and Keytruda 200 mg IV every 3 weeks. First dose March 12, 2017, status post 4 cycles with partial response.  CURRENT THERAPY: Maintenance treatment with single agent Keytruda 200 mg IV every 3 weeks. First cycle June 11, 2017. Status post 35cycles completed 03/02/2019  the patient's scan on 02/06/2019  showedinterval increase in size and number of diffuse bilateral peribronchovascular  groundglass nodularity suspicious for atypical pneumonia/metastatic disease but also could be immunotherapy mediated inflammatory process. He was started on doxycycline and prednisone. Hecompletedthe course of antibiotics.He completed his prednisone taper   SIGNIFICANT EVENTS:  Admit 05/29/2019 Intubate 05/11/19     STUDIES:   CTa 2/5  1. Marked severity diffuse bilateral ground-glass appearing infiltrates. 2. No evidence of pulmonary embolism.     CULTURES:  Sputum 2/6 >>> nl flora  Resp PCR panel 2/6 > neg  Covid PCR  2/4 neg  MRSA  PCR 2/5 neg   ANTIBIOTICS:  Vanc 2/5-2/6 Zmax  2/5 >>> maxepime 2/5 >>> Bactro, 26 >>>   LINES/TUBES:  PIC RUE  2/6 >>> Oral ET 2/8   CONSULTANTS:  Cards 2/5    Scheduled Meds: . atorvastatin  10 mg Oral QPM  . chlorhexidine gluconate (MEDLINE KIT)  15 mL Mouth Rinse BID  . Chlorhexidine Gluconate Cloth  6 each Topical Daily  . digoxin  0.25 mg Intravenous Q6H  . gabapentin  300 mg Oral BID  . insulin aspart  0-5 Units Subcutaneous QHS  . insulin aspart  0-9 Units Subcutaneous TID WC  . mouth rinse  15 mL Mouth Rinse 10 times per day  . methylPREDNISolone (SOLU-MEDROL) injection  80 mg Intravenous Q8H  . pantoprazole (PROTONIX) IV  40 mg Intravenous Q24H  . sodium chloride flush  10 mL Intravenous Q12H  . sodium chloride flush  3 mL Intravenous Q12H  . tamsulosin  0.4 mg Oral Daily   Continuous Infusions: . sodium chloride    .  azithromycin Stopped (05/11/19 1540)  . ceFEPime (MAXIPIME) IV 2 g (05/12/19 0922)  . dexmedetomidine (PRECEDEX) IV infusion 0.8 mcg/kg/hr (05/12/19 1034)  . diltiazem (CARDIZEM) infusion 5 mg/hr (05/12/19 0645)  . fentaNYL 10 mcg/ml infusion 20 mcg/hr (05/12/19 1033)  . heparin 2,150 Units/hr (05/12/19 0915)  . norepinephrine (LEVOPHED) Adult infusion Stopped (05/12/19 1200)  . sulfamethoxazole-trimethoprim 400.8 mg (05/12/19 1031)   PRN Meds:.sodium chloride, acetaminophen, fentaNYL, fentaNYL  (SUBLIMAZE) injection, fentaNYL (SUBLIMAZE) injection, LORazepam, metoprolol tartrate, midazolam, ondansetron **OR** ondansetron (ZOFRAN) IV, sodium chloride, sodium chloride flush, sodium chloride flush   SUBJECTIVE:  Continues to look comfortable sedated on vent on precedex   CONSTITUTIONAL: BP (!) 87/64   Pulse 80   Temp (!) 97.5 F (36.4 C) (Axillary)   Resp (!) 21   Ht '6\' 8"'$  (2.032 m)   Wt 127.1 kg   SpO2 96%   BMI 30.78 kg/m   I/O last 3 completed shifts: In: 6039.5 [P.O.:50; I.V.:2126.4; IV Piggyback:3863.1] Out: 1826 [Urine:1826]    Intake/Output Summary (Last 24 hours) at 05/12/2019 1403 Last data filed at 05/12/2019 1200 Gross per 24 hour  Intake 5231.71 ml  Output 1201 ml  Net 4030.71 ml         Vent Mode: PRVC FiO2 (%):  [60 %-100 %] 60 % Set Rate:  [24 bmp-26 bmp] 26 bmp Vt Set:  [650 mL] 650 mL PEEP:  [5 cmH20-10 cmH20] 10 cmH20 Plateau Pressure:  [20 XBD53-29 cmH20] 38 cmH20  PHYSICAL EXAM: Pt alert, approp nad @ 45 degrees  No jvd Oropharynx oral ET  Neck supple Lungs with diffuse insp crackles bilaterally IRIR  Back in AF on monitor Abd obese with nl  excursion  Extr warm with no edema or clubbing noted Neuro  Sedated on vetn   pCXR 2/9  1. Unremarkable hardware positioning. 2. Unchanged extensive pulmonary opacity.    RESOLVED PROBLEM LIST   ASSESSMENT AND PLAN    1) acute on chronic respiratory failure now vent dp with diffuse as dz ? Etiology with PCT not suggestive of systemic bacterial infection but can't by itself r/o pna  - off ketruda since 03/02/2019 - on Infliximab 2/7  - off amiodarone 2/8 Lab Results  Component Value Date   ESRSEDRATE 30 (H) 05/11/2019   rec: continue high dose steroids for now Very unlikely FOB will yield specific dx that would change course here and risks making things worse than they already are so rec 1) Do BAL per RT for PCP, fungus,  2) ARDS protocol  3) continue high dose steroids  4) If did want  to consider FOB or VATS bx would need to transfer to [facility] first    2) stage IV sq cell ca s airlway involvement  - holding further rx   3) BP soft am 2/9 back in AFib > use neo if needed for low bp and avoid chronotropic stimulants due to AFib    SUMMARY OF TODAY'S PLAN:  No change rx   Best Practice / Goals of Care / Disposition.   DVT PROPHYLAXIS: hep drip SUP: protonix IV  NUTRITION:NPO for now  MOBILITY:bed rest  GOALS OF CARE: Full code for now FAMILY DISCUSSIONS:   Wife again at bedside, addressed risk/ benefit of FOB here. DISPOSITION ICU  LABS  Glucose Recent Labs  Lab 05/11/19 1617 05/11/19 2042 05/11/19 2349 05/12/19 0312 05/12/19 0740 05/12/19 1159  GLUCAP 189* 201* 189* 193* 214* 226*    BMET Recent Labs  Lab 05/10/19 0454 05/11/19 0417  05/12/19 0409  NA 133* 129* 127*  K 3.9 4.1 5.2*  CL 95* 94* 94*  CO2 _0 BUN 25* 31* 39*  CREATININE 0.73 0.95 1.04  GLUCOSE 181* 271* 253*    Liver Enzymes Recent Labs  Lab 05/09/2019 1719 05/08/19 0402 05/12/19 0409  AST 31 38 20  ALT 38 40 48*  ALKPHOS 85 73 115  BILITOT 1.9* 2.9* 0.7  ALBUMIN 3.4* 3.2* 2.5*    Electrolytes Recent Labs  Lab 05/08/19 1159 05/09/19 0439 05/10/19 0454 05/11/19 0417 05/12/19 0409  CALCIUM  --    < > 8.6* 8.5* 8.1*  MG 2.0  --   --   --   --    < > = values in this interval not displayed.    CBC Recent Labs  Lab 05/10/19 0454 05/11/19 0417 05/12/19 0409  WBC 18.6* 15.0* 14.5*  HGB 14.7 14.3 13.4  HCT 44.6 43.0 40.9  PLT 166 121* 128*    ABG Recent Labs  Lab 05/11/19 0906 05/11/19 1546  PHART 7.425 7.320*  PCO2ART 37.3 47.6  PO2ART 92.9 216*    Coag's No results for input(s): APTT, INR in the last 168 hours.  Sepsis Markers Recent Labs  Lab 05/08/19 1159 05/09/19 0439 05/10/19 0454  PROCALCITON 0.29 0.22 0.24    Cardiac Enzymes No results for input(s): TROPONINI, PROBNP in the last 168 hours.    The patient is critically ill  with multiple organ systems failure and requires high complexity decision making for assessment and support, frequent evaluation and titration of therapies, application of advanced monitoring technologies and extensive interpretation of multiple databases. Critical Care Time devoted to patient care services described in this note is 38  minutes.    Christinia Gully, MD Pulmonary and [place] [phone] After 5:30 PM or weekends, use [facility] [phone]

## 2019-05-12 NOTE — Consult Note (Signed)
[facility] Oncology Progress Note  Name: Aaron Sellers      MRN: [id number]    Location: IC07/IC07-01  Date: 05/12/2019 Time:6:32 PM   Subjective: Interval History:Aaron Sellers is seen today for follow-up.  He remains sedated and intubated at 60% FiO2.  He has been off of Levophed since morning.  Mild bleeding from the right nostril noted.  Objective: Vital signs in last 24 hours: Temp:  [97.2 F (36.2 C)-98 F (36.7 C)] 98 F (36.7 C) (02/09 1600) Pulse Rate:  [29-123] 58 (02/09 1730) Resp:  [12-38] 24 (02/09 1730) BP: (72-131)/(52-91) 88/62 (02/09 1730) SpO2:  [77 %-99 %] 95 % (02/09 1537) FiO2 (%):  [60 %-100 %] 60 % (02/09 1537) Weight:  [280 lb 3.3 oz (127.1 kg)] 280 lb 3.3 oz (127.1 kg) (02/09 0500)    Intake/Output from previous day: 02/08 0800 - 02/09 0759 In: 4302.1 [I.V.:1623.4] Out: 1201 [Urine:1201]    Intake/Output this shift: Total I/O In: 1473.2 [I.V.:611.3; IV Piggyback:861.9] Out: 1325 [Urine:1325]   PHYSICAL EXAM: BP (!) 88/62   Pulse (!) 58   Temp 98 F (36.7 C) (Axillary)   Resp (!) 24   Ht 6\' 8"  (2.032 m)   Wt 280 lb 3.3 oz (127.1 kg)   SpO2 95%   BMI 30.78 kg/m  General appearance: Sedated. Lungs: Bilateral air entry present. Heart: regular rate and rhythm Abdomen: soft, non-tender; bowel sounds normal; no masses,  no organomegaly Extremities: Trace edema bilateral. Skin: Skin color, texture, turgor normal. No rashes or lesions Lymph nodes: Cervical, supraclavicular, and axillary nodes normal. Neurologic: Sedated.   Studies/Results: Results for orders placed or performed during the hospital encounter of 05/24/2019 (from the past 48 hour(s))  Glucose, capillary     Status: Abnormal   Collection Time: 05/10/19  9:08 PM  Result Value Ref Range   Glucose-Capillary 237 (H) 70 - 99 mg/dL  Heparin level (unfractionated)     Status: Abnormal   Collection Time: 05/11/19 12:07 AM  Result Value Ref Range   Heparin Unfractionated 0.25 (L)  0.30 - 0.70 IU/mL    Comment: (NOTE) If heparin results are below expected values, and patient dosage has  been confirmed, suggest follow up testing of antithrombin III levels. Performed at [facility], [address], [place], [place] [address]   CBC     Status: Abnormal   Collection Time: 05/11/19  4:17 AM  Result Value Ref Range   WBC 15.0 (H) 4.0 - 10.5 K/uL   RBC 5.06 4.22 - 5.81 MIL/uL   Hemoglobin 14.3 13.0 - 17.0 g/dL   HCT 43.0 39.0 - 52.0 %   MCV 85.0 80.0 - 100.0 fL   MCH 28.3 26.0 - 34.0 pg   MCHC 33.3 30.0 - 36.0 g/dL   RDW 13.2 11.5 - 15.5 %   Platelets 121 (L) 150 - 400 K/uL   nRBC 0.0 0.0 - 0.2 %    Comment: Performed at [facility], [address], [place], [place] [address]  Basic metabolic panel     Status: Abnormal   Collection Time: 05/11/19  4:17 AM  Result Value Ref Range   Sodium 129 (L) 135 - 145 mmol/L   Potassium 4.1 3.5 - 5.1 mmol/L   Chloride 94 (L) 98 - 111 mmol/L   CO2 22 22 - 32 mmol/L   Glucose, Bld 271 (H) 70 - 99 mg/dL   BUN 31 (H) 8 - 23 mg/dL   Creatinine, Ser 0.95 0.61 - 1.24 mg/dL  Calcium 8.5 (L) 8.9 - 10.3 mg/dL   GFR calc non Af Amer >60 >60 mL/min   GFR calc Af Amer >60 >60 mL/min   Anion gap 13 5 - 15    Comment: Performed at [facility], [address] [place], [place] [address]  C-reactive protein     Status: Abnormal   Collection Time: 05/11/19  4:17 AM  Result Value Ref Range   CRP 19.5 (H) <1.0 mg/dL    Comment: Performed at [facility], [address], [place], [place] [address]  Glucose, capillary     Status: Abnormal   Collection Time: 05/11/19  8:13 AM  Result Value Ref Range   Glucose-Capillary 226 (H) 70 - 99 mg/dL  Heparin level (unfractionated)     Status: None   Collection Time: 05/11/19  8:29 AM  Result Value Ref Range   Heparin Unfractionated 0.60 0.30 - 0.70 IU/mL    Comment: (NOTE) If heparin results are below expected values, and patient dosage has  been confirmed, suggest follow up testing of  antithrombin III levels. Performed at [facility], [address], [place], [place] [address]   Blood gas, arterial     Status: None   Collection Time: 05/11/19  9:06 AM  Result Value Ref Range   FIO2 80.00    pH, Arterial 7.425 7.350 - 7.450   pCO2 arterial 37.3 32.0 - 48.0 mmHg   pO2, Arterial 92.9 83.0 - 108.0 mmHg   Bicarbonate 24.7 20.0 - 28.0 mmol/L   Acid-Base Excess 0.2 0.0 - 2.0 mmol/L   O2 Saturation 96.6 %   Patient temperature 36.3    Allens test (pass/fail) PASS PASS    Comment: Performed at [facility], [address] [place], [place] [address]  Glucose, capillary     Status: Abnormal   Collection Time: 05/11/19 11:30 AM  Result Value Ref Range   Glucose-Capillary 263 (H) 70 - 99 mg/dL  Blood gas, arterial     Status: Abnormal   Collection Time: 05/11/19  3:46 PM  Result Value Ref Range   FIO2 100.00    pH, Arterial 7.320 (L) 7.350 - 7.450   pCO2 arterial 47.6 32.0 - 48.0 mmHg   pO2, Arterial 216 (H) 83.0 - 108.0 mmHg   Bicarbonate 22.7 20.0 - 28.0 mmol/L   Acid-base deficit 1.4 0.0 - 2.0 mmol/L   O2 Saturation 98.6 %   Patient temperature 36.4    Allens test (pass/fail) PASS PASS    Comment: Performed at [facility], [address], [place], [place] [address]  Glucose, capillary     Status: Abnormal   Collection Time: 05/11/19  4:17 PM  Result Value Ref Range   Glucose-Capillary 189 (H) 70 - 99 mg/dL  Heparin level (unfractionated)     Status: None   Collection Time: 05/11/19  4:18 PM  Result Value Ref Range   Heparin Unfractionated 0.58 0.30 - 0.70 IU/mL    Comment: (NOTE) If heparin results are below expected values, and patient dosage has  been confirmed, suggest follow up testing of antithrombin III levels. Performed at [facility], [address], [place], [place] [address]   [facility] Ser[facility] (COVID 19)AB(IGG)IA     Status: None   Collection Time: 05/11/19  4:18 PM  Result Value Ref Range   SARS-CoV-2 Ab, IgG NON REACTIVE NON REACTIVE     Comment: (NOTE) Non-Reactive for SARS-CoV-2 IgG Antibodies.  SARS-CoV-2 IgG antibodies not detected.  Negative results do not preclude acute SARS-CoV-2 infection.  Negative results may occur in samples collected too soon following infection or in immunosuppressed patients.  Serologic results should not be used to diagnose or exclude active/recent SARS-CoV-2 infection.  If acute infection is suspected, direct testing for SARS-CoV-2 is necessary.   The expected result is Non-Reactive.  Fact Sheet for Recipients:  LimitBuy.nl  Fact Sheet for Healthcare Providers:  WordAgents.no  Testing was performed using the Beck[employer] Coulter SARS-CoV-2 IgG assay.  This test is not yet approved or cleared by the [place] and has been authorized by FDA under an Emergency Use Authorization (EUA).  This EUA will remain in effect (meaning this test can be used) for the duration of the COVID-19 declaration under Section 564(b)(1) of the  Act, 21 U.S.C. section 360bbb-3(b)(1), unless the authorization is terminated or revoked sooner. Performed at [facility], [place] [address], [place], [place] [address]   Sedimentation rate     Status: Abnormal   Collection Time: 05/11/19  4:18 PM  Result Value Ref Range   Sed Rate 30 (H) 0 - 16 mm/hr    Comment: Performed at [facility], [address], [place], [place] [address]  Glucose, capillary     Status: Abnormal   Collection Time: 05/11/19  8:42 PM  Result Value Ref Range   Glucose-Capillary 201 (H) 70 - 99 mg/dL   Comment 1 Notify RN    Comment 2 Document in Chart   Glucose, capillary     Status: Abnormal   Collection Time: 05/11/19 11:49 PM  Result Value Ref Range   Glucose-Capillary 189 (H) 70 - 99 mg/dL  Glucose, capillary     Status: Abnormal   Collection Time: 05/12/19  3:12 AM  Result Value Ref Range   Glucose-Capillary 193 (H) 70 - 99 mg/dL  C-reactive protein     Status: Abnormal    Collection Time: 05/12/19  4:09 AM  Result Value Ref Range   CRP 13.8 (H) <1.0 mg/dL    Comment: Performed at [facility], [address] [place], [place] [address]  CBC     Status: Abnormal   Collection Time: 05/12/19  4:09 AM  Result Value Ref Range   WBC 14.5 (H) 4.0 - 10.5 K/uL   RBC 4.85 4.22 - 5.81 MIL/uL   Hemoglobin 13.4 13.0 - 17.0 g/dL   HCT 40.9 39.0 - 52.0 %   MCV 84.3 80.0 - 100.0 fL   MCH 27.6 26.0 - 34.0 pg   MCHC 32.8 30.0 - 36.0 g/dL   RDW 13.2 11.5 - 15.5 %   Platelets 128 (L) 150 - 400 K/uL   nRBC 0.0 0.0 - 0.2 %    Comment: Performed at [facility], [address] [place], [place] [address]  Heparin level (unfractionated)     Status: Abnormal   Collection Time: 05/12/19  4:09 AM  Result Value Ref Range   Heparin Unfractionated 0.71 (H) 0.30 - 0.70 IU/mL    Comment: (NOTE) If heparin results are below expected values, and patient dosage has  been confirmed, suggest follow up testing of antithrombin III levels. Performed at [facility], [address] [place], [place] [address]   Comprehensive metabolic panel     Status: Abnormal   Collection Time: 05/12/19  4:09 AM  Result Value Ref Range   Sodium 127 (L) 135 - 145 mmol/L   Potassium 5.2 (H) 3.5 - 5.1 mmol/L    Comment: DELTA CHECK NOTED   Chloride 94 (L) 98 - 111 mmol/L  CO2 24 22 - 32 mmol/L   Glucose, Bld 253 (H) 70 - 99 mg/dL   BUN 39 (H) 8 - 23 mg/dL   Creatinine, Ser 1.04 0.61 - 1.24 mg/dL   Calcium 8.1 (L) 8.9 - 10.3 mg/dL   Total Protein 6.0 (L) 6.5 - 8.1 g/dL   Albumin 2.5 (L) 3.5 - 5.0 g/dL   AST 20 15 - 41 U/L   ALT 48 (H) 0 - 44 U/L   Alkaline Phosphatase 115 38 - 126 U/L   Total Bilirubin 0.7 0.3 - 1.2 mg/dL   GFR calc non Af Amer >60 >60 mL/min   GFR calc Af Amer >60 >60 mL/min   Anion gap 9 5 - 15    Comment: Performed at [facility], [address], [place], [place] [address]  Glucose, capillary     Status: Abnormal   Collection Time: 05/12/19  7:40 AM  Result Value Ref  Range   Glucose-Capillary 214 (H) 70 - 99 mg/dL  Glucose, capillary     Status: Abnormal   Collection Time: 05/12/19 11:59 AM  Result Value Ref Range   Glucose-Capillary 226 (H) 70 - 99 mg/dL  Heparin level (unfractionated)     Status: None   Collection Time: 05/12/19  1:26 PM  Result Value Ref Range   Heparin Unfractionated 0.61 0.30 - 0.70 IU/mL    Comment: (NOTE) If heparin results are below expected values, and patient dosage has  been confirmed, suggest follow up testing of antithrombin III levels. Performed at [facility], [address] [place], [place] [address]   Glucose, capillary     Status: Abnormal   Collection Time: 05/12/19  4:12 PM  Result Value Ref Range   Glucose-Capillary 168 (H) 70 - 99 mg/dL   DG Abd 1 View  Result Date: 05/11/2019 CLINICAL DATA:  Enteric catheter placement EXAM: ABDOMEN - 1 VIEW COMPARISON:  05/11/2019 FINDINGS: Two frontal views of the left upper quadrant of the abdomen are obtained. No enteric catheter is identified. The bowel gas pattern is unremarkable. IMPRESSION: 1. No enteric catheter is identified. Electronically Signed   By: Randa Ngo M.D.   On: 05/11/2019 17:14   DG CHEST PORT 1 VIEW  Result Date: 05/12/2019 CLINICAL DATA:  Orogastric tube placement EXAM: PORTABLE CHEST 1 VIEW COMPARISON:  Yesterday FINDINGS: Endotracheal tube tips are the clavicular heads. The orogastric tube tip and side-port are at the stomach. Cardiomegaly and diffuse pulmonary opacity is unchanged. Right PICC with tip at the upper SVC. No visible effusion or air leak. IMPRESSION: 1. Unremarkable hardware positioning. 2. Unchanged extensive pulmonary opacity. Electronically Signed   By: Monte Fantasia M.D.   On: 05/12/2019 04:48   DG CHEST PORT 1 VIEW  Result Date: 05/11/2019 CLINICAL DATA:  Et tube and ng tube placement EXAM: PORTABLE CHEST 1 VIEW COMPARISON:  Radiograph 05/09/2019 FINDINGS: Endotracheal tube 4.6 cm from carina. RIGHT PICC line tip in distal SVC.  No NG tube identified. Stable enlarged cardiac silhouette. Bilateral patchy airspace disease. IMPRESSION: 1. Endotracheal tube in good position. 2. NG tube not identified. 3. Bilateral patchy airspace disease Electronically Signed   By: Suzy Bouchard M.D.   On: 05/11/2019 14:52     MEDICATIONS: I have reviewed the patient's current medications.     Assessment/Plan:  1.  Stage IV non-small cell lung cancer: -Last Keytruda on 03/02/2019. -CT scan on 03/22/2019 showed groundglass opacities in the peribronchovascular interstitium throughout both lungs with multiple irregular solid and groundglass nodules noted  in both lungs, some of them are cavitary. -He was started on tapering dose of prednisone at 120 mg on 04/01/2019. -CT on 05/08/2019 showed marked worsening of diffuse groundglass infiltrates throughout both lungs.  Negative for pulmonary embolism.  COVID-19 was negative. -He will continue broad-spectrum antibiotics including cefepime, azithromycin, Bactrim. 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 -He was intubated on 05/11/2019.  He is continued on IV steroids at this time. -Follow-up on fungi tell assay.  High suspicion for invasive fungal infection. -Consider bronch with BAL and biopsy  2.  A. fib with RVR: -Currently on IV diltiazem and IV heparin.  All questions were answered. The patient knows to call the clinic with any problems, questions or concerns. We can certainly see the patient much sooner if necessary.    Aaron Sellers

## 2019-05-12 NOTE — Progress Notes (Signed)
PROGRESS NOTE                                                                                                                                                                                                             Patient Demographics:    Aaron Sellers, is a 69 y.o. male, DOB - 1951-02-24, TFT:[id number]  Admit date - 05/29/2019   Admitting Physician Oswald Hillock, MD  Outpatient Primary MD for the patient is Janece Canterbury  LOS - 5   Chief Complaint  Patient presents with  . Shortness of Breath       Brief Narrative    69 y.o. 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  Patient states that he has been having shortness of breath with palpitations and at times was lightheaded.  Denies any chest pain. He was found to be in A. fib with RVR, admitted to stepdown for heart rate control on Cardizem drip, as well patient had progressive hypoxia within first 24 hours on admission, which has escalated rapidly, he D-dimers were elevated, CTA chest with no evidence of PE, but significant for severe interstitial lung disease, high clinical suspicion for Keytruda pneumonitis, received infliximab 2/7 patient with increased work of breathing on BiPAP, more lethargic, required intubation on 2/8.    Subjective:    Aaron Sellers patient remains critically ill, sedated, ventilated and mechanically ventilated. HR is improved and will transition off cardizem drip. Remains in ICU. No fever.     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 -Patient with rapid increase of oxygen requirement, on BiPAP for last 24 hours, this morning is more lethargic, increased work of breathing, he required intubation . -No clear etiology, infectious versus inflammatory . -Highly suspicious  for Keytruda pneumonitis, no improvement with IV steroids, received infliximab 5 mg/kg x1 dose.  2/7 continue with IV Solu-Medrol at 80 mg IV every 8 hours.  -Procalcitonin 0.24, which is reassuring. -Patient empirically on IV vancomycin, cefepime, and azithromycin, I have discussed with PCCM, patient has been on steroids for 5 weeks, will need coverage for atypical infections, continue with azithromycin and cefepime -Vancomycin discontinued on 05/11/19 -at this point patient is unstable for bronchoscopy given high oxygen requirement  -No evidence of volume overload . -follow Fungitell , LDH is elevated, viral panel is negative possibly related to infectious etiology, versus inflammatory etiology. -D-dimer is elevated, but CTA chest is negative for PE. -COVID-19 negative.   -Vent management and sedation per PCCM.  COVID-19 Labs  Recent Labs    05/10/19 0454 05/11/19 0417 05/12/19 0409  CRP 17.6* 19.5* 13.8*    Lab Results  Component Value Date   SARSCOV2NAA NEGATIVE 05/28/2019   A. fib with RVR -Management per cardiology, patient on Cardizem drip; started on digoxin and looking to be transitioned to metoprolol -continue heparin drip -HR controlled today -amiodarone drip discontinued on 05/11/19  Stage IV squamous cell carcinoma -patient followed by oncology as outpatient. -will follow oncology rec's   Code Status : Full(reports he is okay with intubation for short period of time, but does not want to be kept alive on  life support)  Family Communication  : no family at bedside.   Disposition Plan  : Remains ICU  Barriers For Discharge :  On cardizem drip, IV steroids, IV antibiotics and requiring ventilatory support.   Consults  : Cardiology, PCCM, Oncology  Procedures  :  2/6 PICC line, 2/6 ET tube  DVT Prophylaxis  :  Heparin GTT  Lab Results  Component Value Date   PLT 128 (L) 05/12/2019    Antibiotics  :    Anti-infectives (From admission, onward)   Start     Dose/Rate Route Frequency Ordered Stop   05/09/19 1500  sulfamethoxazole-trimethoprim (BACTRIM) 400.8 mg in dextrose 5 % 500 mL IVPB     15 mg/kg/day  106.9 kg (Adjusted) 350 mL/hr over 90 Minutes Intravenous Every 6 hours 05/09/19 1428     05/09/19 0800  vancomycin (VANCOREADY) IVPB 1250 mg/250 mL  Status:  Discontinued     1,250 mg 166.7 mL/hr over 90 Minutes Intravenous Every 12 hours 05/08/19 1725 05/09/19 1412   05/08/19 1900  vancomycin (VANCOREADY) IVPB 2000 mg/400 mL     2,000 mg 200 mL/hr over 120 Minutes Intravenous  Once 05/08/19 1720 05/08/19 2145   05/08/19 1800  ceFEPIme (MAXIPIME) 2 g in sodium chloride 0.9 % 100 mL IVPB     2 g 200 mL/hr over 30 Minutes Intravenous Every 8 hours 05/08/19 1719     05/08/19 1445  azithromycin (ZITHROMAX) 500 mg in sodium chloride 0.9 % 250 mL IVPB     500 mg 250 mL/hr over 60 Minutes Intravenous Every 24 hours 05/08/19 1440     05/08/19 1445  cefTRIAXone (ROCEPHIN) 2 g in sodium chloride 0.9 % 100 mL IVPB  Status:  Discontinued     2 g 200 mL/hr over 30 Minutes Intravenous Every 24 hours 05/08/19 1440 05/08/19 1719        Objective:   Vitals:   05/12/19 1537 05/12/19 1600 05/12/19 1630 05/12/19 1700  BP:  (!) 88/60 (!) 83/61 (!) 87/65  Pulse:  75 (!) 58 (!) 55  Resp:  (!) 23 (!) 23 20  Temp:  98 F (36.7 C)    TempSrc:  Axillary    SpO2: 95%     Weight:      Height:        Wt Readings from Last 3 Encounters:  05/12/19 127.1 kg  04/01/19 129.5 kg  03/19/19  129.2 kg     Intake/Output Summary (Last 24 hours) at 05/12/2019 1736 Last data filed at 05/12/2019 1714 Gross per 24 hour  Intake 4086.56 ml  Output 2526 ml  Net 1560.56 ml     Physical Exam General exam: sedated, intubated and mechanically ventilated. In no aucte distress. Afebrile. Respiratory system: Respiratory effort normal. positive scattered rhonchi, no wheezing.  Cardiovascular system: Rate controlled, no rubs, no gallops; no JVD seen.  Gastrointestinal system: Abdomen is nondistended, soft and nontender. No organomegaly or masses felt. Normal bowel sounds heard. Central nervous system: unable to fully assess given current sedation.  Extremities: No Cyanosis or clubbing.  Skin: No rashes, no petechiae. Psychiatry: unable to fully assess given sedation.     Data Review:    CBC Recent Labs  Lab 05/14/2019 1719 05/08/2019 1719 05/08/19 0402 05/09/19 0439 05/10/19 0454 05/11/19 0417 05/12/19 0409  WBC 12.1*   < > 12.5* 11.0* 18.6* 15.0* 14.5*  HGB 16.0   < > 15.7 15.1 14.7 14.3 13.4  HCT 48.5   < > 48.7 46.4 44.6 43.0 40.9  PLT 229   < > 211 183 166 121* 128*  MCV 86.6   < > 87.3 86.2 84.8 85.0 84.3  MCH 28.6   < > 28.1 28.1 27.9 28.3 27.6  MCHC 33.0   < > 32.2 32.5 33.0 33.3 32.8  RDW 13.4   < > 13.7 13.4 13.2 13.2 13.2  LYMPHSABS 1.1  --   --   --   --   --   --   MONOABS 0.4  --   --   --   --   --   --   EOSABS 0.0  --   --   --   --   --   --   BASOSABS 0.0  --   --   --   --   --   --    < > = values in this interval not displayed.    Chemistries  Recent Labs  Lab 05/06/2019 1719 05/14/2019 1719 05/08/19 0402 05/08/19 1159 05/09/19 0439 05/10/19 0454 05/11/19 0417 05/12/19 0409  NA 134*   < > 134*  --  135 133* 129* 127*  K 3.8   < > 4.8  --  3.5 3.9 4.1 5.2*  CL 98   < > 96*  --  98 95* 94* 94*  CO2 24   < > 23  --  '24 24 22 24  '$ GLUCOSE 136*   < > 147*  --  208* 181* 271* 253*  BUN 17   < > 17  --  22 25* 31* 39*  CREATININE 0.81   < > 1.01  --   0.88 0.73 0.95 1.04  CALCIUM 8.7*   < > 8.8*  --  8.8* 8.6* 8.5* 8.1*  MG  --   --   --  2.0  --   --   --   --   AST 31  --  38  --   --   --   --  20  ALT 38  --  40  --   --   --   --  48*  ALKPHOS 85  --  73  --   --   --   --  115  BILITOT 1.9*  --  2.9*  --   --   --   --  0.7   < > = values in this interval not displayed.    Lab Results  Component Value Date   HGBA1C 7.0 (H) 05/08/2019   ------------------------------------------------------------------------------------------------------------------    Component Value Date/Time   BNP 104.0 (H) 05/10/2019 1720    Inpatient Medications  Scheduled Meds: . atorvastatin  10 mg Oral QPM  . chlorhexidine gluconate (MEDLINE KIT)  15 mL Mouth Rinse BID  . Chlorhexidine Gluconate Cloth  6 each Topical Daily  . digoxin  0.25 mg Intravenous Q6H  . gabapentin  300 mg Oral BID  . insulin aspart  0-5 Units Subcutaneous QHS  . insulin aspart  0-9 Units Subcutaneous TID WC  . mouth rinse  15 mL Mouth Rinse 10 times per day  . methylPREDNISolone (SOLU-MEDROL) injection  80 mg Intravenous Q8H  . metoprolol tartrate  2.5 mg Intravenous Q8H  . pantoprazole (PROTONIX) IV  40 mg Intravenous Q24H  . sodium chloride flush  10 mL Intravenous Q12H  . sodium chloride flush  3 mL Intravenous Q12H   Continuous Infusions: . sodium chloride    . azithromycin 500 mg (05/12/19 1521)  . ceFEPime (MAXIPIME) IV 2 g (05/12/19 0922)  . dexmedetomidine (PRECEDEX) IV infusion 0.8 mcg/kg/hr (05/12/19 1518)  . fentaNYL 10 mcg/ml infusion 20 mcg/hr (05/12/19 1033)  . heparin 2,150 Units/hr (05/12/19 0915)  . sulfamethoxazole-trimethoprim 400.8 mg (05/12/19 1714)   PRN Meds:.sodium chloride, acetaminophen, fentaNYL, fentaNYL (SUBLIMAZE) injection, fentaNYL (SUBLIMAZE) injection, LORazepam, midazolam, ondansetron **OR** ondansetron (ZOFRAN) IV, sodium chloride, sodium chloride flush, sodium chloride flush  Micro Results  Recent Results (from the past 240  hour(s))  SARS CORONAVIRUS 2 (TAT 6-24 HRS) Nasopharyngeal Nasopharyngeal Swab     Status: None   Collection Time: 05/11/2019  7:36 PM   Specimen: Nasopharyngeal Swab  Result Value Ref Range Status   SARS Coronavirus 2 NEGATIVE NEGATIVE Final    Comment: (NOTE) SARS-CoV-2 target nucleic acids are NOT DETECTED. The SARS-CoV-2 RNA is generally detectable in upper and lower respiratory specimens during the acute phase of infection. Negative results do not preclude SARS-CoV-2 infection, do not rule out co-infections with other pathogens, and should not be used as the sole basis for treatment or other patient management decisions. Negative results must be combined with clinical observations, patient history, and epidemiological information. The expected result is Negative. Fact Sheet for Patients: SugarRoll.be Fact Sheet for Healthcare Providers: https://www.woods-mathews.com/ This test is not yet approved or cleared by the [place] FDA and  has been authorized for detection and/or diagnosis of SARS-CoV-2 by FDA under an Emergency Use Authorization (EUA). This EUA will remain  in effect (meaning this test can be used) for the duration of the COVID-19 declaration under Section 56 4(b)(1) of the Act, 21 U.S.C. section 360bbb-3(b)(1), unless the authorization is terminated or revoked sooner. Performed at [facility], [place] [address] [place], [place] [address]   MRSA PCR Screening     Status: None   Collection Time: 05/08/19  3:08 AM   Specimen: Nasal Mucosa; Nasopharyngeal  Result Value Ref Range Status   MRSA by PCR NEGATIVE NEGATIVE Final    Comment:        The GeneXpert MRSA Assay (FDA approved for NASAL specimens only), is one component of a comprehensive MRSA  colonization surveillance program. It is not intended to diagnose MRSA infection nor to guide or monitor treatment for MRSA infections. Performed at [facility],  [address] [place], [place] [address]   Respiratory Panel by PCR     Status: None   Collection Time: 05/09/19  2:05 PM   Specimen: Nasopharyngeal Swab; Respiratory  Result Value Ref Range Status   Adenovirus NOT DETECTED NOT DETECTED Final   Coronavirus 229E NOT DETECTED NOT DETECTED Final    Comment: (NOTE) The Coronavirus on the Respiratory Panel, DOES NOT test for the novel  Coronavirus (2019 nCoV)    Coronavirus HKU1 NOT DETECTED NOT DETECTED Final   Coronavirus NL63 NOT DETECTED NOT DETECTED Final   Coronavirus OC43 NOT DETECTED NOT DETECTED Final   Metapneumovirus NOT DETECTED NOT DETECTED Final   Rhinovirus / Enterovirus NOT DETECTED NOT DETECTED Final   Influenza A NOT DETECTED NOT DETECTED Final   Influenza B NOT DETECTED NOT DETECTED Final   Parainfluenza Virus 1 NOT DETECTED NOT DETECTED Final   Parainfluenza Virus 2 NOT DETECTED NOT DETECTED Final   Parainfluenza Virus 3 NOT DETECTED NOT DETECTED Final   Parainfluenza Virus 4 NOT DETECTED NOT DETECTED Final   Respiratory Syncytial Virus NOT DETECTED NOT DETECTED Final   Bordetella pertussis NOT DETECTED NOT DETECTED Final   Chlamydophila pneumoniae NOT DETECTED NOT DETECTED Final   Mycoplasma pneumoniae NOT DETECTED NOT DETECTED Final    Comment: Performed at [facility], [place] [address], [place], [place] [address]  Expectorated sputum assessment w rflx to resp cult     Status: None   Collection Time: 05/09/19  5:30 PM   Specimen: Expectorated Sputum  Result Value Ref Range Status   Specimen Description EXPECTORATED SPUTUM  Final   Special Requests Immunocompromised  Final   Sputum evaluation   Final    THIS SPECIMEN IS ACCEPTABLE FOR SPUTUM CULTURE Performed at [facility], [address] [place], [place] [address]    Report Status 05/09/2019 FINAL  Final  Culture, respiratory     Status: None   Collection Time: 05/09/19  5:30 PM  Result Value Ref Range Status   Specimen Description   Final    EXPECTORATED  SPUTUM Performed at [facility], [address], [place], [place] [address]    Special Requests   Final    Immunocompromised Reflexed from [phone] Performed at [facility], [address], [place], [place] [address]    Gram Stain   Final    RARE WBC PRESENT, PREDOMINANTLY PMN RARE GRAM POSITIVE COCCI IN PAIRS RARE BUDDING YEAST SEEN FEW SQUAMOUS EPITHELIAL CELLS PRESENT    Culture   Final    FEW Consistent with normal respiratory flora. Performed at [facility], [place] [address] [place], [place] [address]    Report Status 05/12/2019 FINAL  Final    Radiology Reports  DG Chest 2 View  Result Date: 05/12/2019 CLINICAL DATA:  Shortness of breath. EXAM: CHEST - 2 VIEW COMPARISON:  January 26, 2017 FINDINGS: Mild infiltrates are seen within the mid left lung and bilateral lung bases, left greater than right. There is a mild amount of pleural fluid seen along the lateral aspect of the mid right lung. No pneumothorax is identified. The cardiac silhouette is mildly enlarged. The visualized skeletal structures are unremarkable. IMPRESSION: 1. Mild bilateral infiltrates. 2. Small lateral right pleural effusion. A small amount of loculated fluid cannot be excluded. Electronically Signed   By: Joyce Gross.D.  On: 05/19/2019 17:02   DG Abd 1 View  Result Date: 05/11/2019 CLINICAL DATA:  Enteric catheter placement EXAM: ABDOMEN - 1 VIEW COMPARISON:  05/11/2019 FINDINGS: Two frontal views of the left upper quadrant of the abdomen are obtained. No enteric catheter is identified. The bowel gas pattern is unremarkable. IMPRESSION: 1. No enteric catheter is identified. Electronically Signed   By: Randa Ngo M.D.   On: 05/11/2019 17:14   CT ANGIO CHEST PE W OR WO CONTRAST  Result Date: 05/08/2019 CLINICAL DATA:  Shortness of breath. EXAM: CT ANGIOGRAPHY CHEST WITH CONTRAST TECHNIQUE: Multidetector CT imaging of the chest was performed using the standard protocol during bolus administration  of intravenous contrast. Multiplanar CT image reconstructions and MIPs were obtained to evaluate the vascular anatomy. CONTRAST:  194m OMNIPAQUE IOHEXOL 350 MG/ML SOLN COMPARISON:  March 30, 2019 FINDINGS: Cardiovascular: There is moderate severity calcification of the thoracic aorta satisfactory opacification of the pulmonary arteries to the segmental level. No evidence of pulmonary embolism. Normal heart size. No pericardial effusion. Mediastinum/Nodes: There is mild pretracheal lymphadenopathy. Lungs/Pleura: Marked severity diffuse ground-glass appearing infiltrates are seen throughout both lungs. There is no evidence of a pleural effusion or pneumothorax. Upper Abdomen: A stable 2.5 cm x 1.4 cm low-attenuation right adrenal mass is seen. Musculoskeletal: Multilevel degenerative changes seen throughout the thoracic spine. Review of the MIP images confirms the above findings. IMPRESSION: 1. Marked severity diffuse bilateral ground-glass appearing infiltrates. 2. No evidence of pulmonary embolism. 3. Stable low-attenuation right adrenal mass which may represent an adrenal adenoma. Electronically Signed   By: TVirgina NorfolkM.D.   On: 05/08/2019 16:11   DG CHEST PORT 1 VIEW  Result Date: 05/12/2019 CLINICAL DATA:  Orogastric tube placement EXAM: PORTABLE CHEST 1 VIEW COMPARISON:  Yesterday FINDINGS: Endotracheal tube tips are the clavicular heads. The orogastric tube tip and side-port are at the stomach. Cardiomegaly and diffuse pulmonary opacity is unchanged. Right PICC with tip at the upper SVC. No visible effusion or air leak. IMPRESSION: 1. Unremarkable hardware positioning. 2. Unchanged extensive pulmonary opacity. Electronically Signed   By: JMonte FantasiaM.D.   On: 05/12/2019 04:48   DG CHEST PORT 1 VIEW  Result Date: 05/11/2019 CLINICAL DATA:  Et tube and ng tube placement EXAM: PORTABLE CHEST 1 VIEW COMPARISON:  Radiograph 05/09/2019 FINDINGS: Endotracheal tube 4.6 cm from carina. RIGHT PICC  line tip in distal SVC. No NG tube identified. Stable enlarged cardiac silhouette. Bilateral patchy airspace disease. IMPRESSION: 1. Endotracheal tube in good position. 2. NG tube not identified. 3. Bilateral patchy airspace disease Electronically Signed   By: SSuzy BouchardM.D.   On: 05/11/2019 14:52   DG CHEST PORT 1 VIEW  Result Date: 05/09/2019 CLINICAL DATA:  PICC placement EXAM: PORTABLE CHEST 1 VIEW COMPARISON:  Yesterday FINDINGS: Patchy bilateral pneumonia. There is cardiomegaly and vascular pedicle widening. Right PICC with tip at the SVC. No evidence of effusion or pneumothorax. IMPRESSION: 1. Right-sided PICC in good position. 2. Extensive bilateral pneumonia. Electronically Signed   By: JMonte FantasiaM.D.   On: 05/09/2019 14:02   DG Chest Port 1 View  Result Date: 05/08/2019 CLINICAL DATA:  History of lung cancer, oxygen desaturation EXAM: PORTABLE CHEST 1 VIEW COMPARISON:  05/23/2019 FINDINGS: Bilateral interstitial and patchy alveolar airspace opacities. No pleural effusion or pneumothorax. Stable cardiomediastinal silhouette. No aggressive osseous lesion. IMPRESSION: Bilateral patchy interstitial and alveolar airspace opacities as can be seen with multilobar pneumonia. Electronically Signed   By: HKathreen Devoid  On: 05/08/2019 12:14   [place] EKG SITE RITE  Result Date: 05/08/2019 If Site Rite image not attached, placement could not be confirmed due to current cardiac rhythm.    Barton Dubois M.D on 05/12/2019 at 9:00   [facility] -  Office  [phone]

## 2019-05-12 NOTE — Progress Notes (Signed)
Progress Note  Patient Name: Aaron Sellers Date of Encounter: 05/12/2019  Primary Cardiologist: Carlyle Dolly, MD   Subjective   No events overnight  Inpatient Medications    Scheduled Meds: . atorvastatin  10 mg Oral QPM  . chlorhexidine gluconate (MEDLINE KIT)  15 mL Mouth Rinse BID  . Chlorhexidine Gluconate Cloth  6 each Topical Daily  . gabapentin  300 mg Oral BID  . insulin aspart  0-5 Units Subcutaneous QHS  . insulin aspart  0-9 Units Subcutaneous TID WC  . mouth rinse  15 mL Mouth Rinse 10 times per day  . methylPREDNISolone (SOLU-MEDROL) injection  80 mg Intravenous Q8H  . pantoprazole (PROTONIX) IV  40 mg Intravenous Q24H  . sodium chloride flush  10 mL Intravenous Q12H  . sodium chloride flush  3 mL Intravenous Q12H  . tamsulosin  0.4 mg Oral Daily   Continuous Infusions: . sodium chloride    . azithromycin Stopped (05/11/19 1540)  . ceFEPime (MAXIPIME) IV Stopped (05/12/19 0249)  . dexmedetomidine (PRECEDEX) IV infusion 0.6 mcg/kg/hr (05/12/19 0645)  . diltiazem (CARDIZEM) infusion 5 mg/hr (05/12/19 0645)  . fentaNYL 10 mcg/ml infusion 15 mcg/hr (05/12/19 0645)  . heparin 2,150 Units/hr (05/12/19 0645)  . norepinephrine (LEVOPHED) Adult infusion 5 mcg/min (05/12/19 0645)  . sulfamethoxazole-trimethoprim Stopped (05/12/19 0437)   PRN Meds: sodium chloride, acetaminophen, fentaNYL, fentaNYL (SUBLIMAZE) injection, fentaNYL (SUBLIMAZE) injection, LORazepam, metoprolol tartrate, midazolam, ondansetron **OR** ondansetron (ZOFRAN) IV, sodium chloride, sodium chloride flush, sodium chloride flush   Vital Signs    Vitals:   05/12/19 0645 05/12/19 0700 05/12/19 0737 05/12/19 0809  BP: 99/71 99/75    Pulse: (!) 37 (!) 39 (!) 39   Resp: (!) 21 (!) 23 (!) 24   Temp:   98 F (36.7 C)   TempSrc:   Axillary   SpO2: 99%   97%  Weight:      Height:        Intake/Output Summary (Last 24 hours) at 05/12/2019 0841 Last data filed at 05/12/2019 0700 Gross per 24 hour    Intake 4302.08 ml  Output 1201 ml  Net 3101.08 ml   Last 3 Weights 05/12/2019 05/11/2019 05/10/2019  Weight (lbs) 280 lb 3.3 oz 281 lb 8.4 oz 275 lb 12.7 oz  Weight (kg) 127.1 kg 127.7 kg 125.1 kg      Telemetry     afib variable rates- Personally Reviewed  ECG    n/a - Personally Reviewed  Physical Exam   GEN: No acute distress.   Neck: No JVD Cardiac: irreg, no murmurs, rubs, or gallops.  Respiratory: coarse bilaterally GI: Soft, nontender, non-distended  MS: No edema; No deformity. Neuro:  Nonfocal  Psych: intubated and sedated  Labs    High Sensitivity Troponin:  No results for input(s): TROPONINIHS in the last 720 hours.    Chemistry Recent Labs  Lab 05/13/2019 1719 05/06/2019 1719 05/08/19 0402 05/09/19 0439 05/10/19 0454 05/11/19 0417 05/12/19 0409  NA 134*   < > 134*   < > 133* 129* 127*  K 3.8   < > 4.8   < > 3.9 4.1 5.2*  CL 98   < > 96*   < > 95* 94* 94*  CO2 24   < > 23   < > '24 22 24  '$ GLUCOSE 136*   < > 147*   < > 181* 271* 253*  BUN 17   < > 17   < > 25* 31* 39*  CREATININE 0.81   < >  1.01   < > 0.73 0.95 1.04  CALCIUM 8.7*   < > 8.8*   < > 8.6* 8.5* 8.1*  PROT 7.1  --  7.3  --   --   --  6.0*  ALBUMIN 3.4*  --  3.2*  --   --   --  2.5*  AST 31  --  38  --   --   --  20  ALT 38  --  40  --   --   --  48*  ALKPHOS 85  --  73  --   --   --  115  BILITOT 1.9*  --  2.9*  --   --   --  0.7  GFRNONAA >60   < > >60   < > >60 >60 >60  GFRAA >60   < > >60   < > >60 >60 >60  ANIONGAP 12   < > 15   < > '14 13 9   '$ < > = values in this interval not displayed.     Hematology Recent Labs  Lab 05/10/19 0454 05/11/19 0417 05/12/19 0409  WBC 18.6* 15.0* 14.5*  RBC 5.26 5.06 4.85  HGB 14.7 14.3 13.4  HCT 44.6 43.0 40.9  MCV 84.8 85.0 84.3  MCH 27.9 28.3 27.6  MCHC 33.0 33.3 32.8  RDW 13.2 13.2 13.2  PLT 166 121* 128*    BNP Recent Labs  Lab 05/18/2019 1720  BNP 104.0*     DDimer  Recent Labs  Lab 05/08/19 1159  DDIMER 2.11*     Radiology     DG Abd 1 View  Result Date: 05/11/2019 CLINICAL DATA:  Enteric catheter placement EXAM: ABDOMEN - 1 VIEW COMPARISON:  05/11/2019 FINDINGS: Two frontal views of the left upper quadrant of the abdomen are obtained. No enteric catheter is identified. The bowel gas pattern is unremarkable. IMPRESSION: 1. No enteric catheter is identified. Electronically Signed   By: Randa Ngo M.D.   On: 05/11/2019 17:14   DG CHEST PORT 1 VIEW  Result Date: 05/12/2019 CLINICAL DATA:  Orogastric tube placement EXAM: PORTABLE CHEST 1 VIEW COMPARISON:  Yesterday FINDINGS: Endotracheal tube tips are the clavicular heads. The orogastric tube tip and side-port are at the stomach. Cardiomegaly and diffuse pulmonary opacity is unchanged. Right PICC with tip at the upper SVC. No visible effusion or air leak. IMPRESSION: 1. Unremarkable hardware positioning. 2. Unchanged extensive pulmonary opacity. Electronically Signed   By: Monte Fantasia M.D.   On: 05/12/2019 04:48   DG CHEST PORT 1 VIEW  Result Date: 05/11/2019 CLINICAL DATA:  Et tube and ng tube placement EXAM: PORTABLE CHEST 1 VIEW COMPARISON:  Radiograph 05/09/2019 FINDINGS: Endotracheal tube 4.6 cm from carina. RIGHT PICC line tip in distal SVC. No NG tube identified. Stable enlarged cardiac silhouette. Bilateral patchy airspace disease. IMPRESSION: 1. Endotracheal tube in good position. 2. NG tube not identified. 3. Bilateral patchy airspace disease Electronically Signed   By: Suzy Bouchard M.D.   On: 05/11/2019 14:52    Cardiac Studies    Patient Profile     69 y.o. male with a history of hyperlipidemia, stage IV lung cancer, COPD, atrial fibrillation (previously not anticoagulated with CHA2DS2-VASc or of 1, lung cancer, and history of bowel perforation).  Assessment & Plan    1. Afib with RVR - has been on IV dilt and IV amio, on hep gtt - pulm has recommended stopping amio given lung status - currently on dilt  gtt rates low 100s, off amio. Requiring  some low dose levophed - start IV digoxin to see if can further wean dilt gtt given low bp's and current pressor requirement.   2. Acute hypoxic resp failure - diffuse pulm infiltrates on presentation - had been on bipap, no intubated - on emperic broad spectrum abx - COVID 2 negative. 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 Hypotension - started on levophed, maintaining MAPs on low dose.    For questions or updates, please contact [place] Please consult [email or web address] for contact info under        Signed, Carlyle Dolly, MD  05/12/2019, 8:41 AM

## 2019-05-12 NOTE — Progress Notes (Signed)
ANTICOAGULATION CONSULT NOTE -   Pharmacy Consult for Heparin Indication: atrial fibrillation  Allergies  Allergen Reactions  . Ciprofloxacin     HIVES  . Adhesive [Tape] Other (See Comments)    Skin peels  . Codeine Other (See Comments)    Jitters, "skin crawling"    Patient Measurements: Height: 6\' 8"  (203.2 cm) Weight: 281 lb 8.4 oz (127.7 kg) IBW/kg (Calculated) : 96 HEPARIN DW (KG): 121  Vital Signs: Temp: 97.6 F (36.4 C) (02/09 0400) Temp Source: Axillary (02/09 0400) BP: 93/67 (02/09 0500) Pulse Rate: 38 (02/09 0500)  Labs: Recent Labs    05/10/19 0454 05/10/19 1648 05/11/19 0007 05/11/19 0417 05/11/19 0829 05/11/19 1618 05/12/19 0409  HGB 14.7  --   --  14.3  --   --  13.4  HCT 44.6  --   --  43.0  --   --  40.9  PLT 166  --   --  121*  --   --  128*  HEPARINUNFRC  --    < >   < >  --  0.60 0.58 0.71*  CREATININE 0.73  --   --  0.95  --   --  1.04   < > = values in this interval not displayed.    Estimated Creatinine Clearance: 107.4 mL/min (by C-G formula based on SCr of 1.04 mg/dL).   Medical History: Past Medical History:  Diagnosis Date  . Arthritis   . Atrial flutter ([place])   . BPH (benign prostatic hypertrophy) with urinary retention   . Cancer ([place])   . Chronic kidney disease    hx of kidney stones  2011  . COPD (chronic obstructive pulmonary disease) ([place])    has been smoking since he was 22 ish--  . High cholesterol   . Hypertension    dx around 2011  . Squamous cell carcinoma of right lung ([facility])     Medications:  Medications Prior to Admission  Medication Sig Dispense Refill Last Dose  . acetaminophen (TYLENOL) 500 MG tablet Take 1,000 mg by mouth every 6 (six) hours as needed for moderate pain.   unknown at Unknown time  . atorvastatin (LIPITOR) 10 MG tablet Take 10 mg by mouth every evening.    05/06/2019 at Unknown time  . CARTIA XT 180 MG 24 hr capsule Take 1 capsule by mouth once daily (Patient taking differently: Take 180 mg  by mouth daily. ) 90 capsule 0 05/30/2019 at Unknown time  . Cyanocobalamin (VITAMIN B-12 PO) Take 1 tablet by mouth daily.   05/23/2019 at Unknown time  . famotidine (PEPCID) 10 MG tablet Take 10 mg by mouth as needed for heartburn or indigestion.   05/06/2019 at Unknown time  . furosemide (LASIX) 40 MG tablet Take 1 tablet (40 mg total) by mouth daily as needed. 90 tablet 3 Past Week at Unknown time  . gabapentin (NEURONTIN) 300 MG capsule TAKE 1 CAPSULE BY MOUTH THREE TIMES DAILY (Patient taking differently: Take 300 mg by mouth 2 (two) times daily. ) 90 capsule 0 05/28/2019 at Unknown time  . loratadine (CLARITIN) 10 MG tablet Take 10 mg by mouth daily as needed for allergies.    unknown  . tamsulosin (FLOMAX) 0.4 MG CAPS capsule Take 0.4 mg by mouth daily.   05/17/2019 at Unknown time  . terbinafine ([employer] ATHLETES FOOT) 1 % cream Apply 1 application topically daily.   Past Week at Unknown time  . predniSONE (DELTASONE) 20 MG tablet Take 6 tablets  daily for 2 weeks, then take 5 tablets daily for 1 week, then take 4 tablets for 1 week, then take 3 tablets for 1 week, then take 2 tablet for 1 week, then take 1 tablet for 1 week, then take 0.5 tablets for 1 week. 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 D-dimers were elevated, CTA chest with no evidence of PE, but significant for severe interstitial lung disease. Pharmacy asked to anticoagulate with heparin for afib. HL is 0.71, sightly supratherapeutic  Goal of Therapy:  Heparin level 0.3-0.7 units/ml Monitor platelets by anticoagulation protocol: Yes   Plan:  Decrease heparin infusion to 2150  units/hr Check anti-Xa leve daily while on heparin Continue to monitor H&H and platelets  Donna Christen Darcy Cordner, PharmD, MBA, BCGP Clinical Pharmacist  05/12/2019,5:44 AM

## 2019-05-12 NOTE — Consult Note (Signed)
Consultation Note Date: 05/12/2019   Patient Name: Aaron Sellers  DOB: Jan 04, 1951  MRN: [id number]  Age / Sex: 69 y.o., male  PCP: Janece Canterbury Referring Physician: Barton Dubois, MD  Reason for Consultation: Establishing goals of care  HPI/Patient Profile: 69 y.o. 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 - admitted on 05/17/2019 with worsening SOB which progressed to respiratory failure and he is now intubated. 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 Palliative consulted for [place].   Clinical Assessment and Goals of Care:  I have reviewed medical records including EPIC notes, labs and imaging,  examined the patient and met at bedside with patient's long term life partner (over 30 years) to discuss diagnosis prognosis, GOC, EOL wishes, disposition and options. Chaplain Joya Gaskins was kindly in attendance. Patient had expressed his wish for Glenda to be his decision maker- however, advanced directives were not able to be completed. He has two children who are legally his decision makers and Holley Raring is in contact with them.   I introduced Palliative Medicine as specialized medical care for people living with serious illness. It focuses on providing relief from the symptoms and stress of a serious illness.   We discussed a brief life review of the patient. 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 He and Glenda have 5 children between them. He is of Christian faith and attends [employer] in  [place], [place].   As far as functional and nutritional status- Holley Raring has noticed a significant decline in his functional status since December. He had stopped walking his dog, was spending most of his time sitting at home. His nutritional status was stable.   We discussed her current illness and what it means in the larger context of his on-going co-morbidities.  Natural disease trajectory and expectations at EOL were discussed.  I attempted to elicit values and goals of care important to the patient. Per Holley Raring- patient would want to be able to ambulate, walk around his property. He would not want to live disabled.  Advanced directives, concepts specific to code status, artifical feeding and hydration, and rehospitalization were considered and discussed. Patient would not want long term ventilation, no trach, no feeding tube. We discussed Code Status. Holley Raring is considering DNR status and will speak with his children to discuss.   Questions and concerns were addressed.    Primary Decision Maker NEXT OF KIN    SUMMARY OF RECOMMENDATIONS -Continue full scope for now- patient would not want to be permanently disabled, no trach, no PEG -Glenda encouraged to consider DNR and discuss this with his children- I will followup tomorrow     Code  Status/Advance Care Planning:  Full code  Additional Recommendations (Limitations, Scope, Preferences):  No Tracheostomy  Prognosis:    Unable to determine  Discharge Planning: To Be Determined  Primary Diagnoses: Present on Admission: **None**   I have reviewed the medical record, interviewed the patient and family, and examined the patient. The following aspects are pertinent.  Past Medical History:  Diagnosis Date  . Arthritis   . Atrial flutter ([place])   . BPH (benign prostatic hypertrophy) with urinary retention   . Cancer ([place])   . Chronic kidney disease    hx of kidney stones  2011  . COPD (chronic obstructive pulmonary disease)  ([place])    has been smoking since he was 22 ish--  . High cholesterol   . Hypertension    dx around 2011  . Squamous cell carcinoma of right lung [facility])    Social History   Socioeconomic History  . Marital status: Significant Other    Spouse name: Not on file  . Number of children: Not on file  . Years of education: Not on file  . Highest education level: Not on file  Occupational History  . Not on file  Tobacco Use  . Smoking status: Former Smoker    Packs/day: 1.00    Years: 40.00    Pack years: 40.00    Types: Cigarettes    Quit date: 01/27/2017    Years since quitting: 2.2  . Smokeless tobacco: Former Systems developer    Types: Snuff  Substance and Sexual Activity  . Alcohol use: No  . Drug use: No  . Sexual activity: Not Currently  Other Topics Concern  . Not on file  Social History Narrative   Per pt long time gf of 30+ years [name] is HPOA.    Social Determinants of Health   Financial Resource Strain:   . Difficulty of Paying Living Expenses: Not on file  Food Insecurity:   . Worried About Charity fundraiser in the Last Year: Not on file  . Ran Out of Food in the Last Year: Not on file  Transportation Needs:   . Lack of Transportation (Medical): Not on file  . Lack of Transportation (Non-Medical): Not on file  Physical Activity:   . Days of Exercise per Week: Not on file  . Minutes of Exercise per Session: Not on file  Stress:   . Feeling of Stress : Not on file  Social Connections:   . Frequency of Communication with Friends and Family: Not on file  . Frequency of Social Gatherings with Friends and Family: Not on file  . Attends Religious Services: Not on file  . Active Member of Clubs or Organizations: Not on file  . Attends Archivist Meetings: Not on file  . Marital Status: Not on file   Family History  Problem Relation Age of Onset  . Hypertension Mother   . CVA Father   . Lung cancer Sister   . Lung cancer Brother   . Hypertension  Brother   . Hypertension Sister    Scheduled Meds: . atorvastatin  10 mg Oral QPM  . chlorhexidine gluconate (MEDLINE KIT)  15 mL Mouth Rinse BID  . Chlorhexidine Gluconate Cloth  6 each Topical Daily  . digoxin  0.25 mg Intravenous Q6H  . gabapentin  300 mg Oral BID  . insulin aspart  0-5 Units Subcutaneous QHS  . insulin aspart  0-9 Units Subcutaneous TID WC  . mouth rinse  15 mL  Mouth Rinse 10 times per day  . methylPREDNISolone (SOLU-MEDROL) injection  80 mg Intravenous Q8H  . pantoprazole (PROTONIX) IV  40 mg Intravenous Q24H  . sodium chloride flush  10 mL Intravenous Q12H  . sodium chloride flush  3 mL Intravenous Q12H  . tamsulosin  0.4 mg Oral Daily   Continuous Infusions: . sodium chloride    . azithromycin Stopped (05/11/19 1540)  . ceFEPime (MAXIPIME) IV 2 g (05/12/19 0922)  . dexmedetomidine (PRECEDEX) IV infusion 0.8 mcg/kg/hr (05/12/19 1034)  . diltiazem (CARDIZEM) infusion 5 mg/hr (05/12/19 0645)  . fentaNYL 10 mcg/ml infusion 20 mcg/hr (05/12/19 1033)  . heparin 2,150 Units/hr (05/12/19 0915)  . norepinephrine (LEVOPHED) Adult infusion 5 mcg/min (05/12/19 0645)  . sulfamethoxazole-trimethoprim 400.8 mg (05/12/19 1031)   PRN Meds:.sodium chloride, acetaminophen, fentaNYL, fentaNYL (SUBLIMAZE) injection, fentaNYL (SUBLIMAZE) injection, LORazepam, metoprolol tartrate, midazolam, ondansetron **OR** ondansetron (ZOFRAN) IV, sodium chloride, sodium chloride flush, sodium chloride flush Medications Prior to Admission:  Prior to Admission medications   Medication Sig Start Date End Date Taking? Authorizing Provider  acetaminophen (TYLENOL) 500 MG tablet Take 1,000 mg by mouth every 6 (six) hours as needed for moderate pain.   Yes [provider]  atorvastatin (LIPITOR) 10 MG tablet Take 10 mg by mouth every evening.    Yes [provider]  CARTIA XT 180 MG 24 hr capsule Take 1 capsule by mouth once daily Patient taking differently: Take 180 mg by mouth  daily.  02/23/19  Yes BranchAlphonse Guild, MD  Cyanocobalamin (VITAMIN B-12 PO) Take 1 tablet by mouth daily.   Yes [provider]  famotidine (PEPCID) 10 MG tablet Take 10 mg by mouth as needed for heartburn or indigestion.   Yes [provider]  furosemide (LASIX) 40 MG tablet Take 1 tablet (40 mg total) by mouth daily as needed. 10/17/17 03/18/20 Yes Branch, Alphonse Guild, MD  gabapentin (NEURONTIN) 300 MG capsule TAKE 1 CAPSULE BY MOUTH THREE TIMES DAILY Patient taking differently: Take 300 mg by mouth 2 (two) times daily.  04/14/19  Yes Curt Bears, MD  loratadine (CLARITIN) 10 MG tablet Take 10 mg by mouth daily as needed for allergies.    Yes [provider]  tamsulosin (FLOMAX) 0.4 MG CAPS capsule Take 0.4 mg by mouth daily.   Yes [provider]  terbinafine ([employer] ATHLETES FOOT) 1 % cream Apply 1 application topically daily.   Yes [provider]  predniSONE (DELTASONE) 20 MG tablet Take 6 tablets daily for 2 weeks, then take 5 tablets daily for 1 week, then take 4 tablets for 1 week, then take 3 tablets for 1 week, then take 2 tablet for 1 week, then take 1 tablet for 1 week, then take 0.5 tablets for 1 week. Patient not taking: Reported on 05/28/2019 04/01/19   Heilingoetter, Cassandra L, PA-C   Allergies  Allergen Reactions  . Ciprofloxacin     HIVES  . Adhesive [Tape] Other (See Comments)    Skin peels  . Codeine Other (See Comments)    Jitters, "skin crawling"   Review of Systems  Unable to perform ROS: Intubated    Physical Exam Vitals and nursing note reviewed.  Constitutional:      Appearance: He is ill-appearing.  Cardiovascular:     Rate and Rhythm: Normal rate.  Skin:    Coloration: Skin is pale.  Neurological:     Comments: Sedated on vent     Vital Signs: BP 101/83   Pulse 78  Temp (!) 97.5 F (36.4 C) (Axillary)   Resp (!) 21   Ht '6\' 8"'$  (2.032 m)   Wt 127.1 kg   SpO2 93%   BMI 30.78 kg/m  Pain Scale:  CPOT   Pain Score: 5    SpO2: SpO2: 93 % O2 Device:SpO2: 93 % O2 Flow Rate: .O2 Flow Rate (L/min): 15 L/min  IO: Intake/output summary:   Intake/Output Summary (Last 24 hours) at 05/12/2019 1204 Last data filed at 05/12/2019 0900 Gross per 24 hour  Intake 4450.23 ml  Output 1201 ml  Net 3249.23 ml    LBM: Last BM Date: 05/08/19 Baseline Weight: Weight: 129.3 kg Most recent weight: Weight: 127.1 kg     Palliative Assessment/Data: PPS: 10%     Thank you for this consult. Palliative medicine will continue to follow and assist as needed.   Time In: 1100 Time Out: 1220 Time Total: 80 mins Greater than 50%  of this time was spent counseling and coordinating care related to the above assessment and plan.  Signed by: Mariana Kaufman, AGNP-C Palliative Medicine    Please contact Palliative Medicine Team phone at [phone] for questions and concerns.  For individual provider: See Shea Evans

## 2019-05-12 NOTE — Progress Notes (Signed)
Initial Nutrition Assessment  DOCUMENTATION CODES:   Not applicable  INTERVENTION:  If unable to extubate within 24 hrs, recommend initiating Vital HP at 40 ml/hr, advance 15 ml every 8 hrs to goal rate 55 ml/hr with 60 ml Pro-stat TID via tube.  Tube feed regimen at goal rate provides 1920 kcal (108% of estimated daily kcal) 206 grams of protein, and 1109 ml free water  NUTRITION DIAGNOSIS:   Inadequate oral intake related to inability to eat as evidenced by NPO status.  GOAL:   Provide needs based on ASPEN/SCCM guidelines  MONITOR:   Vent status, Weight trends, Labs, I & O's  REASON FOR ASSESSMENT:   Ventilator    ASSESSMENT:   69 year old male with past medical history of paroxysmal atrial flutter, COPD, CKD, HTN, HLD, h/o perforated bowel, stage IV squamous cell carcinoma of the right lunch s/p palliative radiotherapy, systemic chemotherapy completed 2019, presented to ED with complaints of dyspnea over the past 4 days. Patient found to with atrial fibrillation with RVR and CTA chest significant for severe interstitial lung disease.   2/4 Admit 2/7 BiPAP 2/8 Intubated  Non-pitting BUE; Moderate pitting BLE edema noted  per 2/8 RN assessment Current wt 279.62 lbs Weights stable over the past 7 months per history.  Patient meal intake reviewed, eating 50-75% x 2 meals on 2/6.   Patient is currently intubated on ventilator support MV: 22.4 L/min Temp (24hrs), Avg:97.6 F (36.4 C), Min:97.2 F (36.2 C), Max:98 F (36.7 C)  Propofol: n/a  Medications reviewed and include: Digoxin, SS novolog, Methylprednisolone, Protonix Drips: Maxipime Bactrim in D5 Precedex 0.6 mcg Cardizem Heparin Fentanyl 12.5 mcg Levo 6 mcg Labs: CBGs 226,214,193,189,201 x 24 hrs, K 5.2 (H), BUN 39 (H)  NUTRITION - FOCUSED PHYSICAL EXAM: Deferred  Diet Order:   Diet Order            Diet NPO time specified  Diet effective now              EDUCATION NEEDS:   No education  needs have been identified at this time  Skin:  Skin Assessment: Reviewed RN Assessment  Last BM:  2/5  Height:   Ht Readings from Last 1 Encounters:  05/11/19 6\' 8"  (2.032 m)    Weight:   Wt Readings from Last 1 Encounters:  05/12/19 127.1 kg    Ideal Body Weight:  102.7 kg  BMI:  Body mass index is 30.78 kg/m.  Estimated Nutritional Needs:   Kcal:  2542-7062  Protein:  205  Fluid:  >/= 1.5 L/day   Lajuan Lines, RD, LDN Clinical Nutrition Office Telephone [phone] After Hours/Weekend Pager: [phone]

## 2019-05-12 NOTE — Progress Notes (Signed)
Inpatient Diabetes Program Recommendations  AACE/ADA: New Consensus Statement on Inpatient Glycemic Control (2015)  Target Ranges:  Prepandial:   less than 140 mg/dL      Peak postprandial:   less than 180 mg/dL (1-2 hours)      Critically ill patients:  140 - 180 mg/dL   Lab Results  Component Value Date   GLUCAP 214 (H) 05/12/2019   HGBA1C 7.0 (H) 05/08/2019    Review of Glycemic Control  Results for RECARDO, LINN (MRN [id number]) as of 05/12/2019 10:04  Ref. Range 05/11/2019 16:17 05/11/2019 20:42 05/11/2019 23:49 05/12/2019 03:12 05/12/2019 07:40  Glucose-Capillary Latest Ref Range: 70 - 99 mg/dL 189 (H) 201 (H) 189 (H) 193 (H) 214 (H)    Diabetes history: None mentioned Outpatient Diabetes medications: None Current orders for Inpatient glycemic control: Novolog 0-9 TID with meals + 0-5 QHS + solumedrol 80 mg Q8H  Inpatient Diabetes Program Recommendations:     -Please consider Adult ICU glycemic control protocol  Thank you, Reche Dixon, RN, BSN Diabetes Coordinator Inpatient Diabetes Program [phone] (team pager from 8a-5p)

## 2019-05-12 NOTE — Progress Notes (Signed)
Pt seems to be asynchronous and uncomfortable. Placed patient in PCV for about 20 minutes to see if he would tolerate that. He did and was more comfortable and synchronous with the vent. I spoke with Dr. Melvyn Novas about it and his concern was if the patient perhaps developed a plug or something went wrong and we wouldn't know. He would rather I place him back in [facility] and ask RN for more sedation. RN was in the room and I relayed info to him.

## 2019-05-13 ENCOUNTER — Inpatient Hospital Stay (HOSPITAL_COMMUNITY): Payer: Medicare Other

## 2019-05-13 ENCOUNTER — Other Ambulatory Visit (HOSPITAL_COMMUNITY): Payer: Self-pay | Admitting: *Deleted

## 2019-05-13 DIAGNOSIS — I4891 Unspecified atrial fibrillation: Secondary | ICD-10-CM

## 2019-05-13 DIAGNOSIS — D696 Thrombocytopenia, unspecified: Secondary | ICD-10-CM

## 2019-05-13 DIAGNOSIS — Z7189 Other specified counseling: Secondary | ICD-10-CM

## 2019-05-13 LAB — GLUCOSE, CAPILLARY
Glucose-Capillary: 169 mg/dL — ABNORMAL HIGH (ref 70–99)
Glucose-Capillary: 170 mg/dL — ABNORMAL HIGH (ref 70–99)
Glucose-Capillary: 196 mg/dL — ABNORMAL HIGH (ref 70–99)
Glucose-Capillary: 206 mg/dL — ABNORMAL HIGH (ref 70–99)
Glucose-Capillary: 207 mg/dL — ABNORMAL HIGH (ref 70–99)
Glucose-Capillary: 223 mg/dL — ABNORMAL HIGH (ref 70–99)

## 2019-05-13 LAB — CBC
HCT: 40.9 % (ref 39.0–52.0)
Hemoglobin: 13.3 g/dL (ref 13.0–17.0)
MCH: 27.8 pg (ref 26.0–34.0)
MCHC: 32.5 g/dL (ref 30.0–36.0)
MCV: 85.6 fL (ref 80.0–100.0)
Platelets: 104 10*3/uL — ABNORMAL LOW (ref 150–400)
RBC: 4.78 MIL/uL (ref 4.22–5.81)
RDW: 13.2 % (ref 11.5–15.5)
WBC: 9.8 10*3/uL (ref 4.0–10.5)
nRBC: 0 % (ref 0.0–0.2)

## 2019-05-13 LAB — ECHOCARDIOGRAM COMPLETE
Height: 80 in
Weight: 4507.97 oz

## 2019-05-13 LAB — C-REACTIVE PROTEIN: CRP: 6.6 mg/dL — ABNORMAL HIGH (ref <1.0)

## 2019-05-13 LAB — HEPARIN LEVEL (UNFRACTIONATED)
Heparin Unfractionated: 0.72 IU/mL — ABNORMAL HIGH (ref 0.30–0.70)
Heparin Unfractionated: 0.55 IU/mL (ref 0.30–0.70)

## 2019-05-13 LAB — FUNGITELL, SERUM: Fungitell Result: 188 pg/mL — ABNORMAL HIGH (ref <80)

## 2019-05-13 MED ORDER — DIGOXIN 0.25 MG/ML IJ SOLN
0.1250 mg | Freq: Every day | INTRAMUSCULAR | Status: DC
  Administered 2019-05-13 – 2019-05-15 (×3): 0.125 mg via INTRAVENOUS
  Filled 2019-05-13 (×3): qty 2

## 2019-05-13 MED ORDER — PRO-STAT SUGAR FREE PO LIQD
30.0000 mL | Freq: Three times a day (TID) | ORAL | Status: DC
  Administered 2019-05-13 – 2019-05-14 (×3): 30 mL via ORAL
  Filled 2019-05-13 (×4): qty 30

## 2019-05-13 MED ORDER — VITAL HIGH PROTEIN PO LIQD: 1000.0000 mL | ORAL | Status: DC

## 2019-05-13 MED ORDER — MIDAZOLAM 50MG/50ML (1MG/ML) PREMIX INFUSION
0.5000 mg/h | INTRAVENOUS | Status: DC
  Administered 2019-05-13: 12:00:00 0.5 mg/h via INTRAVENOUS
  Filled 2019-05-13: qty 50

## 2019-05-13 MED ORDER — VITAL HIGH PROTEIN PO LIQD
1000.0000 mL | ORAL | Status: DC
  Administered 2019-05-13 – 2019-05-14 (×2): 1000 mL

## 2019-05-13 MED ORDER — VITAL HIGH PROTEIN PO LIQD
1000.0000 mL | ORAL | Status: AC
  Administered 2019-05-13: 1000 mL

## 2019-05-13 NOTE — Progress Notes (Signed)
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 Patient states that he has been having shortness of breath with palpitations and at times was lightheaded. Denies any chest pain. He was found to be in A. fib with RVR, admitted to stepdown for heart rate control on Cardizem drip, as well patient had progressive hypoxia within first 24 hours on admission, which has escalated rapidly, he D-dimers were elevated, CTA chest with no evidence of PE, but significant for severe interstitial lung disease, high clinical suspicion for Keytruda pneumonitis, received infliximab 2/7 patient with increased work of breathing on BiPAP, more lethargic, required intubation on 2/8.  2/10: Plan to continue current antibiotics as well as high-dose steroids.  Diltiazem being weaned by cardiology with use of digoxin as well as Lopressor.  2D echocardiogram obtained and pending.  Pressors have been weaned off.  Continue on heparin drip in case cardioversion is required and monitor platelet counts closely.  Assessment & Plan:   Active Problems:   Atrial fibrillation with rapid ventricular response (HCC)   Primary malignant neoplasm of right lung metastatic to other site [facility])   Pneumonitis   Goals of care, counseling/discussion   Advanced care planning/counseling discussion   Palliative care by specialist   Acute respiratory distress syndrome (ARDS) (HCC)   Thrombocytopenia (HCC)   Acute hypoxic respiratory failure. -Patient with rapid increase of oxygen requirement, on BiPAP for last 24  hours, this morning is more lethargic, increased work of breathing, he required intubation . -No clear etiology, infectious versus inflammatory . -Highly suspicious for Keytruda pneumonitis, no improvement with IV steroids, received infliximab 5 mg/kg x1 dose.  2/7 continue with IV Solu-Medrol at 80 mg IV every 8 hours.  -Procalcitonin 0.24, which is reassuring. -Continue azithromycin and cefepime for atypical infection coverage -Vancomycin discontinued on 05/11/19 -at this point patient is unstable for bronchoscopy given high oxygen requirement  -No evidence of volume overload . -follow Fungitell , LDH is elevated, viral panel is negative possibly related to infectious etiology, versus inflammatory etiology. -D-dimer is elevated, but CTA chest was negative for PE. -COVID-19 negative.   -Vent management and sedation per PCCM, continue ARDS protocol -BAL performed for PCP and fungus pending -Versed drip initiated for additional sedation.  A. fib with RVR -Appreciate cardiology management with weaning of diltiazem drip using digoxin and Lopressor -continue heparin drip for possible cardioversion -Continue telemetry monitoring -amiodarone drip discontinued on 05/11/19 due to concerns about lung status -2D echocardiogram performed with results pending  Hypotension -Weaned off norepinephrine today  Stage IV squamous cell carcinoma -Status post palliative radiotherapy and systemic chemotherapy with further treatment being held -patient followed by oncology as outpatient. -will follow oncology rec's  Acute thrombocytopenia -Continue to monitor closely while on heparin drip and discontinue as needed  DVT prophylaxis: Heparin drip Code Status: DNR Family Communication: None at bedside Disposition Plan: Continue ventilator support per PCCM as well as heart rate control per cardiology.  Appreciate palliative care for family discussions and patient is now DNR.  Plan to start tube feeds.  Versed  drip initiated for additional sedation.   Consultants:   Cardiology  PCCM  Oncology  Palliative care  Procedures:   2/6 PICC line  2/6 ET tube  Antimicrobials:  Anti-infectives (From admission, onward)   Start     Dose/Rate Route Frequency Ordered Stop   05/09/19 1500  sulfamethoxazole-trimethoprim (BACTRIM) 400.8 mg in dextrose 5 % 500 mL IVPB     15 mg/kg/day  106.9 kg (Adjusted) 350 mL/hr over 90 Minutes Intravenous Every 6 hours 05/09/19 1428     05/09/19 0800  vancomycin (VANCOREADY) IVPB 1250 mg/250 mL  Status:  Discontinued     1,250 mg 166.7 mL/hr over 90 Minutes Intravenous Every 12 hours 05/08/19 1725 05/09/19 1412   05/08/19 1900  vancomycin (VANCOREADY) IVPB 2000 mg/400 mL     2,000 mg 200 mL/hr over 120 Minutes Intravenous  Once 05/08/19 1720 05/08/19 2145   05/08/19 1800  ceFEPIme (MAXIPIME) 2 g in sodium chloride 0.9 % 100 mL IVPB     2 g 200 mL/hr over 30 Minutes Intravenous Every 8 hours 05/08/19 1719     05/08/19 1445  azithromycin (ZITHROMAX) 500 mg in sodium chloride 0.9 % 250 mL IVPB     500 mg 250 mL/hr over 60 Minutes Intravenous Every 24 hours 05/08/19 1440     05/08/19 1445  cefTRIAXone (ROCEPHIN) 2 g in sodium chloride 0.9 % 100 mL IVPB  Status:  Discontinued     2 g 200 mL/hr over 30 Minutes Intravenous Every 24 hours 05/08/19 1440 05/08/19 1719       Subjective: Patient seen and evaluated today and remains intubated and sedated.  Objective: Vitals:   05/13/19 1500 05/13/19 1525 05/13/19 1530 05/13/19 1545  BP: 107/71  92/71 101/69  Pulse: 82  60 (!) 59  Resp: (!) 29  (!) 26 20  Temp:      TempSrc:      SpO2:  94%  92%  Weight:      Height:        Intake/Output Summary (Last 24 hours) at 05/13/2019 1554 Last data filed at 05/13/2019 1159 Gross per 24 hour  Intake 2813.73 ml  Output 1850 ml  Net 963.73 ml   Filed Weights   05/11/19 0500 05/12/19 0500 05/13/19 0545  Weight: 127.7 kg 127.1 kg 127.8 kg     Examination:  General exam: Sedated and intubated Respiratory system: Intubated with FiO2 60% Cardiovascular system: S1 & S2 heard, irregular. No JVD, murmurs, rubs, gallops or clicks. No pedal edema. Gastrointestinal system: Abdomen is nondistended, soft and nontender. No organomegaly or masses felt. Normal bowel sounds heard. Central nervous system: Sedated Extremities: No significant edema Skin: No rashes, lesions or ulcers Psychiatry: Cannot be assessed given current condition    Data Reviewed: I have personally reviewed following labs and imaging studies  CBC: Recent Labs  Lab 05/11/2019 1719 05/08/19 0402 05/09/19 0439 05/10/19 0454 05/11/19 0417 05/12/19 0409 05/13/19 0420  WBC 12.1*   < > 11.0* 18.6* 15.0* 14.5* 9.8  NEUTROABS 10.3*  --   --   --   --   --   --   HGB 16.0   < > 15.1 14.7 14.3 13.4 13.3  HCT 48.5   < > 46.4 44.6 43.0 40.9 40.9  MCV 86.6   < > 86.2 84.8 85.0 84.3 85.6  PLT 229   < > 183 166 121* 128* 104*   < > = values in this interval not displayed.   Basic Metabolic Panel: Recent Labs  Lab 05/08/19 0402 05/08/19 1159 05/09/19 0439 05/10/19 0454 05/11/19  0417 05/12/19 0409  NA 134*  --  135 133* 129* 127*  K 4.8  --  3.5 3.9 4.1 5.2*  CL 96*  --  98 95* 94* 94*  CO2 23  --  '24 24 22 24  '$ GLUCOSE 147*  --  208* 181* 271* 253*  BUN 17  --  22 25* 31* 39*  CREATININE 1.01  --  0.88 0.73 0.95 1.04  CALCIUM 8.8*  --  8.8* 8.6* 8.5* 8.1*  MG  --  2.0  --   --   --   --    GFR: Estimated Creatinine Clearance: 107.4 mL/min (by C-G formula based on SCr of 1.04 mg/dL). Liver Function Tests: Recent Labs  Lab 05/04/2019 1719 05/08/19 0402 05/12/19 0409  AST 31 38 20  ALT 38 40 48*  ALKPHOS 85 73 115  BILITOT 1.9* 2.9* 0.7  PROT 7.1 7.3 6.0*  ALBUMIN 3.4* 3.2* 2.5*   No results for input(s): LIPASE, AMYLASE in the last 168 hours. No results for input(s): AMMONIA in the last 168 hours. Coagulation Profile: No results for input(s): INR,  PROTIME in the last 168 hours. Cardiac Enzymes: No results for input(s): CKTOTAL, CKMB, CKMBINDEX, TROPONINI in the last 168 hours. BNP (last 3 results) No results for input(s): PROBNP in the last 8760 hours. HbA1C: No results for input(s): HGBA1C in the last 72 hours. CBG: Recent Labs  Lab 05/12/19 2002 05/13/19 0001 05/13/19 0349 05/13/19 0749 05/13/19 1154  GLUCAP 185* 196* 207* 206* 170*   Lipid Profile: No results for input(s): CHOL, HDL, LDLCALC, TRIG, CHOLHDL, LDLDIRECT in the last 72 hours. Thyroid Function Tests: No results for input(s): TSH, T4TOTAL, FREET4, T3FREE, THYROIDAB in the last 72 hours. Anemia Panel: No results for input(s): VITAMINB12, FOLATE, FERRITIN, TIBC, IRON, RETICCTPCT in the last 72 hours. Sepsis Labs: Recent Labs  Lab 05/08/19 1159 05/09/19 0439 05/10/19 0454  PROCALCITON 0.29 0.22 0.24    Recent Results (from the past 240 hour(s))  SARS CORONAVIRUS 2 (TAT 6-24 HRS) Nasopharyngeal Nasopharyngeal Swab     Status: None   Collection Time: 05/14/2019  7:36 PM   Specimen: Nasopharyngeal Swab  Result Value Ref Range Status   SARS Coronavirus 2 NEGATIVE NEGATIVE Final    Comment: (NOTE) SARS-CoV-2 target nucleic acids are NOT DETECTED. The SARS-CoV-2 RNA is generally detectable in upper and lower respiratory specimens during the acute phase of infection. Negative results do not preclude SARS-CoV-2 infection, do not rule out co-infections with other pathogens, and should not be used as the sole basis for treatment or other patient management decisions. Negative results must be combined with clinical observations, patient history, and epidemiological information. The expected result is Negative. Fact Sheet for Patients: SugarRoll.be Fact Sheet for Healthcare Providers: https://www.woods-mathews.com/ This test is not yet approved or cleared by the [place] FDA and  has been authorized for detection  and/or diagnosis of SARS-CoV-2 by FDA under an Emergency Use Authorization (EUA). This EUA will remain  in effect (meaning this test can be used) for the duration of the COVID-19 declaration under Section 56 4(b)(1) of the Act, 21 U.S.C. section 360bbb-3(b)(1), unless the authorization is terminated or revoked sooner. Performed at [facility], [place] [address], [place], [place] [address]   MRSA PCR Screening     Status: None   Collection Time: 05/08/19  3:08 AM   Specimen: Nasal Mucosa; Nasopharyngeal  Result Value Ref Range Status   MRSA by PCR NEGATIVE NEGATIVE Final  Comment:        The GeneXpert MRSA Assay (FDA approved for NASAL specimens only), is one component of a comprehensive MRSA colonization surveillance program. It is not intended to diagnose MRSA infection nor to guide or monitor treatment for MRSA infections. Performed at [facility], [address], [place], [place] [address]   Respiratory Panel by PCR     Status: None   Collection Time: 05/09/19  2:05 PM   Specimen: Nasopharyngeal Swab; Respiratory  Result Value Ref Range Status   Adenovirus NOT DETECTED NOT DETECTED Final   Coronavirus 229E NOT DETECTED NOT DETECTED Final    Comment: (NOTE) The Coronavirus on the Respiratory Panel, DOES NOT test for the novel  Coronavirus (2019 nCoV)    Coronavirus HKU1 NOT DETECTED NOT DETECTED Final   Coronavirus NL63 NOT DETECTED NOT DETECTED Final   Coronavirus OC43 NOT DETECTED NOT DETECTED Final   Metapneumovirus NOT DETECTED NOT DETECTED Final   Rhinovirus / Enterovirus NOT DETECTED NOT DETECTED Final   Influenza A NOT DETECTED NOT DETECTED Final   Influenza B NOT DETECTED NOT DETECTED Final   Parainfluenza Virus 1 NOT DETECTED NOT DETECTED Final   Parainfluenza Virus 2 NOT DETECTED NOT DETECTED Final   Parainfluenza Virus 3 NOT DETECTED NOT DETECTED Final   Parainfluenza Virus 4 NOT DETECTED NOT DETECTED Final   Respiratory Syncytial Virus NOT DETECTED  NOT DETECTED Final   Bordetella pertussis NOT DETECTED NOT DETECTED Final   Chlamydophila pneumoniae NOT DETECTED NOT DETECTED Final   Mycoplasma pneumoniae NOT DETECTED NOT DETECTED Final    Comment: Performed at [facility], [place] [address], [place], [place] [address]  Expectorated sputum assessment w rflx to resp cult     Status: None   Collection Time: 05/09/19  5:30 PM   Specimen: Expectorated Sputum  Result Value Ref Range Status   Specimen Description EXPECTORATED SPUTUM  Final   Special Requests Immunocompromised  Final   Sputum evaluation   Final    THIS SPECIMEN IS ACCEPTABLE FOR SPUTUM CULTURE Performed at [facility], [address] [place], [place] [address]    Report Status 05/09/2019 FINAL  Final  Culture, respiratory     Status: None   Collection Time: 05/09/19  5:30 PM  Result Value Ref Range Status   Specimen Description   Final    EXPECTORATED SPUTUM Performed at [facility], [address] [place], [place] [address]    Special Requests   Final    Immunocompromised Reflexed from [id number] Performed at [facility], [address], [place], [place] [address]    Gram Stain   Final    RARE WBC PRESENT, PREDOMINANTLY PMN RARE GRAM POSITIVE COCCI IN PAIRS RARE BUDDING YEAST SEEN FEW SQUAMOUS EPITHELIAL CELLS PRESENT    Culture   Final    FEW Consistent with normal respiratory flora. Performed at [facility], [place] [address], [place], [place] [address]    Report Status 05/12/2019 FINAL  Final         Radiology Studies: DG Abd 1 View  Result Date: 05/13/2019 CLINICAL DATA:  Orogastric tube placement. EXAM: ABDOMEN - 1 VIEW COMPARISON:  May 11, 2019. FINDINGS: The bowel gas pattern is normal. Distal tip of enteric tube is seen in proximal stomach. No radio-opaque calculi or other significant radiographic abnormality are seen. IMPRESSION: Distal tip of enteric tube seen in proximal stomach. Electronically Signed   By: Marijo Conception M.D.   On:  05/13/2019 11:31  DG Abd 1 View  Result Date: 05/11/2019 CLINICAL DATA:  Enteric catheter placement EXAM: ABDOMEN - 1 VIEW COMPARISON:  05/11/2019 FINDINGS: Two frontal views of the left upper quadrant of the abdomen are obtained. No enteric catheter is identified. The bowel gas pattern is unremarkable. IMPRESSION: 1. No enteric catheter is identified. Electronically Signed   By: Randa Ngo M.D.   On: 05/11/2019 17:14   DG CHEST PORT 1 VIEW  Result Date: 05/12/2019 CLINICAL DATA:  Orogastric tube placement EXAM: PORTABLE CHEST 1 VIEW COMPARISON:  Yesterday FINDINGS: Endotracheal tube tips are the clavicular heads. The orogastric tube tip and side-port are at the stomach. Cardiomegaly and diffuse pulmonary opacity is unchanged. Right PICC with tip at the upper SVC. No visible effusion or air leak. IMPRESSION: 1. Unremarkable hardware positioning. 2. Unchanged extensive pulmonary opacity. Electronically Signed   By: Monte Fantasia M.D.   On: 05/12/2019 04:48   ECHOCARDIOGRAM COMPLETE  Result Date: 05/13/2019    ECHOCARDIOGRAM REPORT   Patient Name:   JANICE SEALES Date of Exam: 05/13/2019 Medical Rec #:  [id number]    Height:       80.0 in Accession #:    [id number]   Weight:       281.7 lb Date of Birth:  [birth year]    BSA:          2.66 m Patient Age:    79 years     BP:           107/71 mmHg Patient Gender: M            HR:           82 bpm. Exam Location:  [facility] Procedure: 2D Echo Indications:    Atrial Fibrillation 427.31 / I48.91  History:        Patient has prior history of Echocardiogram examinations, most                 recent 06/01/2017. COPD, Arrythmias:Atrial Fibrillation and Atrial                 Flutter; Risk Factors:Former Smoker, Dyslipidemia and                 Hypertension. Lung mass,Primary malignant neoplasm of right lung                 metastatic to other site.  Sonographer:    Leavy Cella RDCS (AE) Referring Phys: [id number] [place]  1. Left ventricular  ejection fraction, by estimation, is 45%. The left ventricle has mildly decreased function. The left ventrical has no regional wall motion abnormalities. There is mildly increased left ventricular hypertrophy. Left ventricular diastolic parameters are indeterminate.  2. Right ventricular systolic function is normal. The right ventricular size is normal. There is mildly elevated pulmonary artery systolic pressure.  3. The mitral valve is normal in structure and function. trivial mitral valve regurgitation. No evidence of mitral stenosis.  4. The aortic valve is tricuspid. Aortic valve regurgitation is not visualized. No aortic stenosis is present. FINDINGS  Left Ventricle: Left ventricular ejection fraction, by estimation, is 45%. The left ventricle has mildly decreased function. The left ventricle has no regional wall motion abnormalities. There is mildly increased left ventricular hypertrophy. Left ventricular diastolic parameters are indeterminate. Right Ventricle: The right ventricular size is normal. No increase in right ventricular wall thickness. Right ventricular systolic function is normal. There is mildly elevated pulmonary artery systolic pressure. The tricuspid regurgitant velocity is  2.52  m/s, and with an assumed right atrial pressure of 10 mmHg, the estimated right ventricular systolic pressure is 16.5 mmHg. Left Atrium: Left atrial size was normal in size. Right Atrium: Right atrial size was normal in size. Pericardium: There is no evidence of pericardial effusion. Mitral Valve: The mitral valve is normal in structure and function. Trivial mitral valve regurgitation. No evidence of mitral valve stenosis. Tricuspid Valve: The tricuspid valve is normal in structure. Tricuspid valve regurgitation is mild . No evidence of tricuspid stenosis. Aortic Valve: The aortic valve is tricuspid. Aortic valve regurgitation is not visualized. No aortic stenosis is present. Aortic valve mean gradient measures 4.9  mmHg. Aortic valve peak gradient measures 8.7 mmHg. Aortic valve area, by VTI measures 1.74 cm. Pulmonic Valve: The pulmonic valve was not well visualized. Pulmonic valve regurgitation is not visualized. No evidence of pulmonic stenosis. Aorta: The aortic root is normal in size and structure. Pulmonary Artery: Indeterminate PASP, inadequate TR jet. Venous: The inferior vena cava was not well visualized. IAS/Shunts: No atrial level shunt detected by color flow Doppler.  LEFT VENTRICLE PLAX 2D LVIDd:         4.86 cm  Diastology LVIDs:         4.01 cm  LV e' lateral:   9.03 cm/s LV PW:         1.34 cm  LV E/e' lateral: 9.9 LV IVS:        1.15 cm  LV e' medial:    6.96 cm/s LVOT diam:     2.10 cm  LV E/e' medial:  12.9 LV SV:         47.58 ml LV SV Index:   14.87 LVOT Area:     3.46 cm  RIGHT VENTRICLE RV S prime:     11.10 cm/s TAPSE (M-mode): 1.8 cm LEFT ATRIUM             Index       RIGHT ATRIUM           Index LA diam:        2.90 cm 1.09 cm/m  RA Area:     19.00 cm LA Vol (A2C):   68.4 ml 25.71 ml/m RA Volume:   51.10 ml  19.21 ml/m LA Vol (A4C):   53.2 ml 20.00 ml/m LA Biplane Vol: 65.1 ml 24.47 ml/m  AORTIC VALVE AV Area (Vmax):    1.82 cm AV Area (Vmean):   1.65 cm AV Area (VTI):     1.74 cm AV Vmax:           147.80 cm/s AV Vmean:          106.122 cm/s AV VTI:            0.273 m AV Peak Grad:      8.7 mmHg AV Mean Grad:      4.9 mmHg LVOT Vmax:         77.61 cm/s LVOT Vmean:        50.669 cm/s LVOT VTI:          0.137 m LVOT/AV VTI ratio: 0.50  AORTA Ao Root diam: 3.40 cm MITRAL VALVE                        TRICUSPID VALVE MV Area (PHT): 4.80 cm             TR Peak grad:   25.4 mmHg MV Decel Time: 158 msec  TR Vmax:        252.00 cm/s MV E velocity: 89.70 cm/s 103 cm/s MV A velocity: 30.60 cm/s 70.3 cm/s SHUNTS MV E/A ratio:  2.93       1.5       Systemic VTI:  0.14 m                                     Systemic Diam: 2.10 cm Carlyle Dolly MD Electronically signed by Carlyle Dolly MD  Signature Date/Time: 05/13/2019/3:45:36 PM    Final         Scheduled Meds: . atorvastatin  10 mg Oral QPM  . chlorhexidine gluconate (MEDLINE KIT)  15 mL Mouth Rinse BID  . Chlorhexidine Gluconate Cloth  6 each Topical Daily  . digoxin  0.125 mg Intravenous Daily  . feeding supplement (PRO-STAT SUGAR FREE 64)  30 mL Oral TID  . gabapentin  300 mg Oral BID  . insulin aspart  0-5 Units Subcutaneous QHS  . insulin aspart  0-9 Units Subcutaneous TID WC  . mouth rinse  15 mL Mouth Rinse 10 times per day  . methylPREDNISolone (SOLU-MEDROL) injection  80 mg Intravenous Q8H  . metoprolol tartrate  2.5 mg Intravenous Q8H  . pantoprazole (PROTONIX) IV  40 mg Intravenous Q24H  . sodium chloride flush  10 mL Intravenous Q12H  . sodium chloride flush  3 mL Intravenous Q12H   Continuous Infusions: . sodium chloride    . azithromycin 500 mg (05/13/19 1420)  . ceFEPime (MAXIPIME) IV 2 g (05/13/19 1014)  . dexmedetomidine (PRECEDEX) IV infusion 1.1 mcg/kg/hr (05/13/19 1521)  . feeding supplement (VITAL HIGH PROTEIN)    . feeding supplement (VITAL HIGH PROTEIN)    . fentaNYL 10 mcg/ml infusion 20 mcg/hr (05/13/19 1159)  . heparin 2,000 Units/hr (05/13/19 1159)  . midazolam 0.5 mg/hr (05/13/19 1208)  . sulfamethoxazole-trimethoprim 400.8 mg (05/13/19 1525)     LOS: 6 days    Time spent: 30 minutes    Jadee Golebiewski Darleen Crocker, DO [facility] Pager [phone]  If 7PM-7AM, please contact night-coverage [email or web address] Password [facility] 05/13/2019, 3:54 PM

## 2019-05-13 NOTE — Progress Notes (Signed)
ANTICOAGULATION CONSULT NOTE -   Pharmacy Consult for Heparin Indication: atrial fibrillation  Allergies  Allergen Reactions  . Ciprofloxacin     HIVES  . Adhesive [Tape] Other (See Comments)    Skin peels  . Codeine Other (See Comments)    Jitters, "skin crawling"    Patient Measurements: Height: 6\' 8"  (203.2 cm) Weight: 281 lb 12 oz (127.8 kg) IBW/kg (Calculated) : 96 HEPARIN DW (KG): 121  Vital Signs: Temp: 97.5 F (36.4 C) (02/10 1151) Temp Source: Axillary (02/10 1151) BP: 106/76 (02/10 1645) Pulse Rate: 78 (02/10 1645)  Labs: Recent Labs    05/11/19 0417 05/11/19 0829 05/12/19 0409 05/12/19 0409 05/12/19 1326 05/13/19 0420 05/13/19 1611  HGB 14.3   < > 13.4  --   --  13.3  --   HCT 43.0  --  40.9  --   --  40.9  --   PLT 121*  --  128*  --   --  104*  --   HEPARINUNFRC  --    < > 0.71*   < > 0.61 0.72* 0.55  CREATININE 0.95  --  1.04  --   --   --   --    < > = values in this interval not displayed.    Estimated Creatinine Clearance: 107.4 mL/min (by C-G formula based on SCr of 1.04 mg/dL).   Medical History: Past Medical History:  Diagnosis Date  . Arthritis   . Atrial flutter ([place])   . BPH (benign prostatic hypertrophy) with urinary retention   . Cancer ([place])   . Chronic kidney disease    hx of kidney stones  2011  . COPD (chronic obstructive pulmonary disease) ([place])    has been smoking since he was 22 ish--  . High cholesterol   . Hypertension    dx around 2011  . Squamous cell carcinoma of right lung (HCC)     Medications:  Medications Prior to Admission  Medication Sig Dispense Refill Last Dose  . acetaminophen (TYLENOL) 500 MG tablet Take 1,000 mg by mouth every 6 (six) hours as needed for moderate pain.   unknown at Unknown time  . atorvastatin (LIPITOR) 10 MG tablet Take 10 mg by mouth every evening.    05/06/2019 at Unknown time  . CARTIA XT 180 MG 24 hr capsule Take 1 capsule by mouth once daily (Patient taking differently: Take 180  mg by mouth daily. ) 90 capsule 0 05/22/2019 at Unknown time  . Cyanocobalamin (VITAMIN B-12 PO) Take 1 tablet by mouth daily.   05/23/2019 at Unknown time  . famotidine (PEPCID) 10 MG tablet Take 10 mg by mouth as needed for heartburn or indigestion.   05/06/2019 at Unknown time  . furosemide (LASIX) 40 MG tablet Take 1 tablet (40 mg total) by mouth daily as needed. 90 tablet 3 Past Week at Unknown time  . gabapentin (NEURONTIN) 300 MG capsule TAKE 1 CAPSULE BY MOUTH THREE TIMES DAILY (Patient taking differently: Take 300 mg by mouth 2 (two) times daily. ) 90 capsule 0 05/22/2019 at Unknown time  . loratadine (CLARITIN) 10 MG tablet Take 10 mg by mouth daily as needed for allergies.    unknown  . tamsulosin (FLOMAX) 0.4 MG CAPS capsule Take 0.4 mg by mouth daily.   05/16/2019 at Unknown time  . terbinafine ([employer] ATHLETES FOOT) 1 % cream Apply 1 application topically daily.   Past Week at Unknown time  . predniSONE (DELTASONE) 20 MG tablet Take 6  tablets daily for 2 weeks, then take 5 tablets daily for 1 week, then take 4 tablets for 1 week, then take 3 tablets for 1 week, then take 2 tablet for 1 week, then take 1 tablet for 1 week, then take 0.5 tablets for 1 week. 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 D-dimers were elevated, CTA chest with no evidence of PE, but significant for severe interstitial lung disease. Pharmacy asked to anticoagulate with heparin for afib. HL is 0.55, jherapeutic  Goal of Therapy:  Heparin level 0.3-0.7 units/ml Monitor platelets by anticoagulation protocol: Yes   Plan:  Continue  heparin infusion at 2000 units/hr Check  anti-Xa level daily while on heparin Continue to monitor H&H and platelets  Isac Sarna, BS Vena [place], BCPS Clinical Pharmacist Pager [phone] 05/13/2019 5:00 PM

## 2019-05-13 NOTE — Progress Notes (Signed)
Pharmacy Antibiotic Note  Aaron Sellers is a 69 y.o. male admitted on 05/30/2019 with pneumonia.  Pharmacy has been consulted for Bactrim dosing. Chest xray bilateral PNA. H/O pneumonitis, on steroid taper. PCCM concerned may have PCP. For bronch with BAL today. 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 Afebrile. Most recent PCT 0.24.   Plan: Continue Bactrim IV (TMP 15mg /kg/day adjusted body wt 107kg) 400mg  IV q6h Continue cefepime 2gm IV q8h and azithromycin 500mg  IV q24h Continue Azithromycin 500mg  IV q24h F/u cxs and clinical progress Monitor V/S, labs and f/u BAL  Height: 6\' 8"  (203.2 cm) Weight: 281 lb 12 oz (127.8 kg) IBW/kg (Calculated) : 96  Temp (24hrs), Avg:97.7 F (36.5 C), Min:97.1 F (36.2 C), Max:98 F (36.7 C)  Recent Labs  Lab 05/08/19 0402 05/08/19 0402 05/09/19 0439 05/10/19 0454 05/11/19 0417 05/12/19 0409 05/13/19 0420  WBC 12.5*   < > 11.0* 18.6* 15.0* 14.5* 9.8  CREATININE 1.01  --  0.88 0.73 0.95 1.04  --    < > = values in this interval not displayed.    Estimated Creatinine Clearance: 107.4 mL/min (by C-G formula based on SCr of 1.04 mg/dL).    Allergies  Allergen Reactions  . Ciprofloxacin     HIVES  . Adhesive [Tape] Other (See Comments)    Skin peels  . Codeine Other (See Comments)    Jitters, "skin crawling"    Antimicrobials this admission: Bactrim IV(TMP-SMX) 2/6 >>  Vanco 2/5 >> 2/6 Cefepime 2/5 >>  Azith 2/5 >> CTX 2/5 >> 2/5  Dose adjustments this admission: prn  Microbiology results: 2/5 MRSA PCR: negative 2/4 SARS-2 CV is negative 2/5 HIV screen is non reactive  Thank you for allowing pharmacy to be a part of this patient's care.  Isac Sarna, BS Pharm D, BCPS Clinical Pharmacist Pager [phone] 05/13/2019 10:00 AM

## 2019-05-13 NOTE — Progress Notes (Signed)
*  PRELIMINARY RESULTS* Echocardiogram 2D Echocardiogram has been performed.  Aaron Sellers 05/13/2019, 3:30 PM

## 2019-05-13 NOTE — Progress Notes (Signed)
Daily Progress Note   Patient Name: Aaron Sellers       Date: 05/13/2019 DOB: 1950/07/21  Age: 69 y.o. MRN#: [id number] Attending Physician: Rodena Goldmann, DO Primary Care Physician: Janece Canterbury Admit Date: 05/04/2019  Reason for Consultation/Follow-up: Establishing goals of care  Subjective: Patient sedated on vent. Per RN had difficult morning with sedation needs increased.  Glenda at bedside. She discussed with patient's children and they all agreed to DNR status. They wish to continue current level of care- with limits set at no trach, no PEG. Glenda notes patient would want temporary trial of intubation- but not long term.   Review of Systems  Unable to perform ROS: Intubated    Length of Stay: 6  Current Medications: Scheduled Meds:  . atorvastatin  10 mg Oral QPM  . chlorhexidine gluconate (MEDLINE KIT)  15 mL Mouth Rinse BID  . Chlorhexidine Gluconate Cloth  6 each Topical Daily  . digoxin  0.125 mg Intravenous Daily  . gabapentin  300 mg Oral BID  . insulin aspart  0-5 Units Subcutaneous QHS  . insulin aspart  0-9 Units Subcutaneous TID WC  . mouth rinse  15 mL Mouth Rinse 10 times per day  . methylPREDNISolone (SOLU-MEDROL) injection  80 mg Intravenous Q8H  . metoprolol tartrate  2.5 mg Intravenous Q8H  . pantoprazole (PROTONIX) IV  40 mg Intravenous Q24H  . sodium chloride flush  10 mL Intravenous Q12H  . sodium chloride flush  3 mL Intravenous Q12H    Continuous Infusions: . sodium chloride    . azithromycin 500 mg (05/12/19 1521)  . ceFEPime (MAXIPIME) IV 2 g (05/13/19 1014)  . dexmedetomidine (PRECEDEX) IV infusion 1.1 mcg/kg/hr (05/13/19 1207)  . fentaNYL 10 mcg/ml infusion 20 mcg/hr (05/13/19 1159)  . heparin 2,000 Units/hr (05/13/19 1159)  .  midazolam 0.5 mg/hr (05/13/19 1208)  . sulfamethoxazole-trimethoprim 400.8 mg (05/13/19 0827)    PRN Meds: sodium chloride, acetaminophen, fentaNYL, fentaNYL (SUBLIMAZE) injection, fentaNYL (SUBLIMAZE) injection, LORazepam, midazolam, ondansetron **OR** ondansetron (ZOFRAN) IV, sodium chloride, sodium chloride flush, sodium chloride flush  Physical Exam Vitals and nursing note reviewed.  Cardiovascular:     Pulses: Normal pulses.     Comments: Peripheral edema Neurological:     Comments: Sedated  Vital Signs: BP 113/86   Pulse (!) 116   Temp (!) 97.5 F (36.4 C) (Axillary)   Resp (!) 23   Ht '6\' 8"'$  (2.032 m)   Wt 127.8 kg   SpO2 93%   BMI 30.95 kg/m  SpO2: SpO2: 93 % O2 Device: O2 Device: Ventilator O2 Flow Rate: O2 Flow Rate (L/min): 15 L/min  Intake/output summary:   Intake/Output Summary (Last 24 hours) at 05/13/2019 1314 Last data filed at 05/13/2019 1159 Gross per 24 hour  Intake 2813.73 ml  Output 2325 ml  Net 488.73 ml   LBM: Last BM Date: 05/08/19 Baseline Weight: Weight: 129.3 kg Most recent weight: Weight: 127.8 kg       Palliative Assessment/Data: PPS: 10%      Patient Active Problem List   Diagnosis Date Noted  . Acute respiratory distress syndrome (ARDS) ([place]) 05/12/2019  . Goals of care, counseling/discussion   . Advanced care planning/counseling discussion   . Palliative care by specialist   . Pneumonitis   . Diverticulitis of sigmoid colon vs Appendicitis 06/02/2017  . Dysuria 04/03/2017  . 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 Encounter for antineoplastic immunotherapy 03/12/2017  . Primary malignant neoplasm of right lung metastatic to other site ([place]) 02/27/2017  . Lung mass   . Atrial flutter ([place])   . Hypokalemia 01/27/2017  . Adrenal mass ([place]) 01/27/2017  . Tobacco abuse 01/27/2017  . COPD (chronic obstructive pulmonary disease) ([place]) 01/27/2017  . Hypertension 01/27/2017  . High  cholesterol 01/27/2017  . Chronic kidney disease 01/27/2017  . Hyponatremia 01/27/2017  . Leukocytosis 01/27/2017  . RLQ abdominal pain 01/27/2017  . Diverticulitis of intestine with perforation 01/26/2017  . Atrial fibrillation with rapid ventricular response ([place]) 01/26/2017  . Primary osteoarthritis of left knee 12/01/2014    Palliative Care Assessment & Plan   Patient Profile:  69 y.o. 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 - admitted on 05/04/2019 with worsening SOB which progressed to respiratory failure and he is now intubated. 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 Palliative consulted for [place].   Assessment/Recommendations/Plan   DNR  Continue current level of care- plan to meet on Friday with patient's partner- Holley Raring and his children for continued [place] discussion  Goals of Care and Additional Recommendations:  Limitations on Scope of Treatment: No Tracheostomy  Code Status:  DNR  Prognosis:   Unable to determine  Discharge Planning:  To Be Determined  Care plan was discussed with patient's partner- Holley Raring.   Thank you for allowing the Palliative Medicine Team to assist in the care of this patient.   Time In: 1230 Time Out: 1305 Total Time 35 mins Prolonged Time Billed no      Greater than 50%  of this time was spent counseling and coordinating care related to the above assessment and plan.  Mariana Kaufman, AGNP-C Palliative Medicine   Please contact Palliative Medicine Team phone at [phone] for questions and concerns.

## 2019-05-13 NOTE — Progress Notes (Signed)
Progress Note  Patient Name: Aaron Sellers Date of Encounter: 05/13/2019  Primary Cardiologist: Carlyle Dolly, MD   Subjective   No events overnight  Inpatient Medications    Scheduled Meds: . atorvastatin  10 mg Oral QPM  . chlorhexidine gluconate (MEDLINE KIT)  15 mL Mouth Rinse BID  . Chlorhexidine Gluconate Cloth  6 each Topical Daily  . gabapentin  300 mg Oral BID  . insulin aspart  0-5 Units Subcutaneous QHS  . insulin aspart  0-9 Units Subcutaneous TID WC  . mouth rinse  15 mL Mouth Rinse 10 times per day  . methylPREDNISolone (SOLU-MEDROL) injection  80 mg Intravenous Q8H  . metoprolol tartrate  2.5 mg Intravenous Q8H  . pantoprazole (PROTONIX) IV  40 mg Intravenous Q24H  . sodium chloride flush  10 mL Intravenous Q12H  . sodium chloride flush  3 mL Intravenous Q12H   Continuous Infusions: . sodium chloride    . azithromycin 500 mg (05/12/19 1521)  . ceFEPime (MAXIPIME) IV 2 g (05/13/19 0143)  . dexmedetomidine (PRECEDEX) IV infusion 0.9 mcg/kg/hr (05/13/19 0312)  . fentaNYL 10 mcg/ml infusion 20 mcg/hr (05/12/19 1033)  . heparin 2,150 Units/hr (05/12/19 7619)  . sulfamethoxazole-trimethoprim 400.8 mg (05/13/19 0827)   PRN Meds: sodium chloride, acetaminophen, fentaNYL, fentaNYL (SUBLIMAZE) injection, fentaNYL (SUBLIMAZE) injection, LORazepam, midazolam, ondansetron **OR** ondansetron (ZOFRAN) IV, sodium chloride, sodium chloride flush, sodium chloride flush   Vital Signs    Vitals:   05/13/19 0700 05/13/19 0715 05/13/19 0730 05/13/19 0814  BP: 102/68 103/68 106/70   Pulse: (!) 41 (!) 41 (!) 42   Resp: (!) 23 18 (!) 24   Temp:    97.8 F (36.6 C)  TempSrc:    Axillary  SpO2:   96%   Weight:      Height:        Intake/Output Summary (Last 24 hours) at 05/13/2019 0834 Last data filed at 05/13/2019 0500 Gross per 24 hour  Intake 1876.79 ml  Output 2325 ml  Net -448.21 ml   Last 3 Weights 05/13/2019 05/12/2019 05/11/2019  Weight (lbs) 281 lb 12 oz 280  lb 3.3 oz 281 lb 8.4 oz  Weight (kg) 127.8 kg 127.1 kg 127.7 kg      Telemetry    Rate controlled afib - Personally Reviewed  ECG    n/a- Personally Reviewed  Physical Exam   GEN: No acute distress.   Neck: No JVD Cardiac: irreg, no murmurs, rubs, or gallops.  Respiratory: Clear to auscultation bilaterally. GI: Soft, nontender, non-distended  MS: No edema; No deformity. Neuro:  Nonfocal  Psych: sedated, intubated  Labs    High Sensitivity Troponin:  No results for input(s): TROPONINIHS in the last 720 hours.    Chemistry Recent Labs  Lab 05/17/2019 1719 05/22/2019 1719 05/08/19 0402 05/09/19 0439 05/10/19 0454 05/11/19 0417 05/12/19 0409  NA 134*   < > 134*   < > 133* 129* 127*  K 3.8   < > 4.8   < > 3.9 4.1 5.2*  CL 98   < > 96*   < > 95* 94* 94*  CO2 24   < > 23   < > '24 22 24  '$ GLUCOSE 136*   < > 147*   < > 181* 271* 253*  BUN 17   < > 17   < > 25* 31* 39*  CREATININE 0.81   < > 1.01   < > 0.73 0.95 1.04  CALCIUM 8.7*   < >  8.8*   < > 8.6* 8.5* 8.1*  PROT 7.1  --  7.3  --   --   --  6.0*  ALBUMIN 3.4*  --  3.2*  --   --   --  2.5*  AST 31  --  38  --   --   --  20  ALT 38  --  40  --   --   --  48*  ALKPHOS 85  --  73  --   --   --  115  BILITOT 1.9*  --  2.9*  --   --   --  0.7  GFRNONAA >60   < > >60   < > >60 >60 >60  GFRAA >60   < > >60   < > >60 >60 >60  ANIONGAP 12   < > 15   < > '14 13 9   '$ < > = values in this interval not displayed.     Hematology Recent Labs  Lab 05/11/19 0417 05/12/19 0409 05/13/19 0420  WBC 15.0* 14.5* 9.8  RBC 5.06 4.85 4.78  HGB 14.3 13.4 13.3  HCT 43.0 40.9 40.9  MCV 85.0 84.3 85.6  MCH 28.3 27.6 27.8  MCHC 33.3 32.8 32.5  RDW 13.2 13.2 13.2  PLT 121* 128* 104*    BNP Recent Labs  Lab 05/09/2019 1720  BNP 104.0*     DDimer  Recent Labs  Lab 05/08/19 1159  DDIMER 2.11*     Radiology    DG Abd 1 View  Result Date: 05/11/2019 CLINICAL DATA:  Enteric catheter placement EXAM: ABDOMEN - 1 VIEW COMPARISON:   05/11/2019 FINDINGS: Two frontal views of the left upper quadrant of the abdomen are obtained. No enteric catheter is identified. The bowel gas pattern is unremarkable. IMPRESSION: 1. No enteric catheter is identified. Electronically Signed   By: Randa Ngo M.D.   On: 05/11/2019 17:14   DG CHEST PORT 1 VIEW  Result Date: 05/12/2019 CLINICAL DATA:  Orogastric tube placement EXAM: PORTABLE CHEST 1 VIEW COMPARISON:  Yesterday FINDINGS: Endotracheal tube tips are the clavicular heads. The orogastric tube tip and side-port are at the stomach. Cardiomegaly and diffuse pulmonary opacity is unchanged. Right PICC with tip at the upper SVC. No visible effusion or air leak. IMPRESSION: 1. Unremarkable hardware positioning. 2. Unchanged extensive pulmonary opacity. Electronically Signed   By: Monte Fantasia M.D.   On: 05/12/2019 04:48   DG CHEST PORT 1 VIEW  Result Date: 05/11/2019 CLINICAL DATA:  Et tube and ng tube placement EXAM: PORTABLE CHEST 1 VIEW COMPARISON:  Radiograph 05/09/2019 FINDINGS: Endotracheal tube 4.6 cm from carina. RIGHT PICC line tip in distal SVC. No NG tube identified. Stable enlarged cardiac silhouette. Bilateral patchy airspace disease. IMPRESSION: 1. Endotracheal tube in good position. 2. NG tube not identified. 3. Bilateral patchy airspace disease Electronically Signed   By: Suzy Bouchard M.D.   On: 05/11/2019 14:52    Cardiac Studies    Patient Profile  69 y.o.malewith a history of hyperlipidemia, stage IV lung cancer, COPD, atrial fibrillation (previously not anticoagulated with CHA2DS2-VASc or of 1, lung cancer, and history of bowel perforation).  Assessment & Plan    1. Afib with RVR - has been on IV dilt. IV digoxin started yesterday. Amio stopped due to concerns about lung status. AV nodal agents affected by low bp's, was on levophed but now off - CHA2DS2-VASc of 1, lung cancer, and history of bowel perforation) had not been  on home anticoag  - currently on dilt  gtt weaned off . On lopressor 2.'5mg'$  every 8 hrs, hep gtt in case cardioversion is required, loaded with IV dig 0.'5mg'$  x 1 followed by 0.'25mg'$  x 2. Start digoxin 0.'125mg'$  IV daily maintenance and continue lopressor 2.'5mg'$  IV every 8 hours. Bp's stable but soft, now off pressors.  -obtain echo since rate controlled   2. Acute hypoxic resp failure - diffuse pulm infiltrates on presentation - had been on bipap, later intubated - on emperic broad spectrum abx - COVID 2 negative. 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 Hypotension - started on levophed, now off  For questions or updates, please contact [place] Please consult [email or web address] for contact info under        Signed, Carlyle Dolly, MD  05/13/2019, 8:34 AM

## 2019-05-13 NOTE — Progress Notes (Signed)
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     HISTORY OF PRESENT ILLNESS   Not able to give hx as intubated  Per chart:  69 y.o. 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   In the ED patient was found to be in A. fib with RVR, started on IV Cardizem but gradually worse 1/8 and required ET and PCCM consulted    SIGNIFICANT PAST MEDICAL HISTORY   DIAGNOSIS: Stage IV (T2a,N3,M1c)non-small cell lung cancer, squamous cell carcinoma presented with right upper lobe lung mass in addition to right hilar and mediastinal adenopathy as well as metastatic disease in the medial right thigh diagnosed in November 2018.  PRIOR THERAPY: 1) Palliative radiotherapy to the right lower extremity mass completed April 09, 2017. 2) Systemic chemotherapy with carboplatin for AUC of 5, paclitaxel 175 mg/M2 and Keytruda 200 mg IV every 3 weeks. First dose March 12, 2017, status post 4 cycles with partial response.  CURRENT THERAPY: Maintenance treatment with single agent Keytruda 200 mg IV every 3 weeks. First cycle June 11, 2017. Status post 35cycles completed 03/02/2019  the patient's scan on 02/06/2019  showedinterval increase in size and number of diffuse bilateral peribronchovascular groundglass nodularity suspicious for atypical  pneumonia/metastatic disease but also could be immunotherapy mediated inflammatory process. He was started on doxycycline and prednisone. Hecompletedthe course of antibiotics.He completed his prednisone taper   SIGNIFICANT EVENTS:  Admit 05/09/2019 Intubate 05/11/19   Infliximab 2/7 >>>    STUDIES:   CTa 2/5  1. Marked severity diffuse bilateral ground-glass appearing infiltrates. 2. No evidence of pulmonary embolism. Echo 2/10 >>>     CULTURES:  Sputum 2/6 >>> nl flora  Resp PCR panel 2/6  > neg  Covid PCR  2/4 neg  MRSA  PCR 2/5 neg  ET fungus 2/10 ET PCP  2/10   ANTIBIOTICS:  Vanc 2/5-2/6 Zmax  2/5 >>> maxepime 2/5 >>> Bactrim 2/6 >>>   LINES/TUBES:  PIC RUE  2/6 >>> Oral ET 2/8  >>>  CONSULTANTS:  Cards 2/5    Scheduled Meds: . atorvastatin  10 mg Oral QPM  . chlorhexidine gluconate (MEDLINE KIT)  15 mL Mouth Rinse BID  . Chlorhexidine Gluconate Cloth  6 each Topical Daily  . digoxin  0.125 mg Intravenous Daily  . gabapentin  300 mg Oral BID  . insulin aspart  0-5 Units Subcutaneous QHS  . insulin aspart  0-9 Units Subcutaneous TID WC  . mouth rinse  15 mL Mouth Rinse 10 times per day  . methylPREDNISolone (SOLU-MEDROL) injection  80 mg Intravenous Q8H  . metoprolol tartrate  2.5 mg Intravenous Q8H  . pantoprazole (PROTONIX) IV  40 mg Intravenous Q24H  . sodium chloride flush  10 mL Intravenous Q12H  . sodium chloride flush  3 mL Intravenous Q12H   Continuous Infusions: . sodium chloride    . azithromycin 500 mg (05/13/19 1420)  . ceFEPime (MAXIPIME) IV 2 g (05/13/19 1014)  . dexmedetomidine (PRECEDEX) IV infusion 1.1 mcg/kg/hr (05/13/19 1207)  . fentaNYL 10 mcg/ml infusion 20 mcg/hr (05/13/19 1159)  . heparin 2,000 Units/hr (05/13/19 1159)  . midazolam 0.5 mg/hr (05/13/19 1208)  . sulfamethoxazole-trimethoprim 400.8 mg (05/13/19 0827)   PRN Meds:.sodium chloride, acetaminophen, fentaNYL, fentaNYL (SUBLIMAZE) injection, fentaNYL (SUBLIMAZE)  injection, LORazepam, midazolam, ondansetron **OR** ondansetron (ZOFRAN) IV, sodium chloride, sodium chloride flush, sodium chloride flush   SUBJECTIVE/pvernight:  Sedated on vent/ off pressors  CONSTITUTIONAL: BP 113/86   Pulse (!) 116   Temp (!) 97.5 F (36.4 C) (Axillary)   Resp (!) 23   Ht '6\' 8"'$  (2.032 m)   Wt 127.8 kg   SpO2 93%   BMI 30.95 kg/m   I/O last 3 completed shifts: In: 3920 [I.V.:1528.4; IV Piggyback:2391.5] Out: 9833 [Urine:3175]    Intake/Output Summary (Last 24 hours) at 05/13/2019 1428 Last data filed at 05/13/2019 1159 Gross per 24 hour  Intake 2813.73 ml  Output 2325 ml  Net 488.73 ml           Vent Mode: PRVC FiO2 (%):  [50 %-60 %] 50 % Set Rate:  [26 bmp] 26 bmp Vt Set:  [650 mL] 650 mL PEEP:  [8 cmH20-10 cmH20] 8 cmH20 Plateau Pressure:  [20 cmH20-31 cmH20] 22 cmH20  PHYSICAL EXAM: Pt sedated on vent at 45 degrees No jvd Oropharynx Pos ET  Lungs distant insp crackles bilaterally IRIR   Afib on monitor Abd obese with limited excursion Extr  Warm/ s edema/ clubbing noted   Neuro  Moving all 4 ext when more awake      RESOLVED PROBLEM LIST   ASSESSMENT AND PLAN    1) Acute on chronic respiratory failure now vent dp with diffuse as dz ? Etiology with PCT not suggestive of systemic bacterial infection but can't by itself r/o pna  - off ketruda since 03/02/2019 and on variable pred rx since then - on Infliximab 2/7  - off amiodarone 2/8 Lab Results  Component Value Date   ESRSEDRATE 30 (H) 05/11/2019   rec: continue high dose steroids for now Very unlikely FOB will yield specific dx that would change course here and risks making things worse than they already are so rec 1) await BAL done  per RT for PCP, fungus,  2) ARDS protocol      2) stage IV sq cell ca s airlway involvement  - holding further rx   3) BP soft am 2/9 back in AFib  - bp ok off pressors 2/10   4) Plt trending down on heparin= thrombocytenia/ acute, not  present on admit. Lab Results  Component Value Date   PLT 104 (L) 05/13/2019   PLT 128 (L) 05/12/2019   PLT 121 (L) 05/11/2019   PLT 207 03/30/2019   PLT 169 03/02/2019   PLT 185 02/10/2019   PLT 261 04/03/2017   PLT 171 03/27/2017   PLT 177 03/19/2017    Continue to monitor, low threshold to d/c hep  SUMMARY OF TODAY'S PLAN:  No change rx   Best Practice / Goals of Care / Disposition.   DVT PROPHYLAXIS: hep drip SUP: protonix IV  NUTRITION:NPO for now  MOBILITY:bed rest  GOALS OF CARE: Full code for now FAMILY DISCUSSIONS:  Updated wife at bedside. DISPOSITION ICU  LABS  Glucose Recent Labs  Lab 05/12/19 1612 05/12/19 2002 05/13/19 0001 05/13/19 0349 05/13/19 0749 05/13/19 1154  GLUCAP 168* 185* 196* 207* 206* 170*    BMET Recent Labs  Lab 05/10/19 0454 05/11/19 0417 05/12/19 0409  NA 133* 129* 127*  K 3.9 4.1 5.2*  CL 95* 94* 94*  CO2 '24 22 24  '$ BUN 25* 31* 39*  CREATININE 0.73 0.95 1.04  GLUCOSE 181* 271* 253*    Liver Enzymes Recent Labs  Lab 05/05/2019 1719 05/08/19 0402 05/12/19 0409  AST 31 38 20  ALT 38 40 48*  ALKPHOS 85 73 115  BILITOT 1.9* 2.9* 0.7  ALBUMIN 3.4* 3.2* 2.5*    Electrolytes Recent Labs  Lab 05/08/19 1159 05/09/19 0439 05/10/19 0454 05/11/19 0417 05/12/19 0409  CALCIUM  --    < > 8.6* 8.5* 8.1*  MG 2.0  --   --   --   --    < > = values in this interval not displayed.    CBC Recent Labs  Lab 05/11/19 0417 05/12/19 0409 05/13/19 0420  WBC 15.0* 14.5* 9.8  HGB 14.3 13.4 13.3  HCT 43.0 40.9 40.9  PLT 121* 128* 104*    ABG Recent Labs  Lab 05/11/19 0906 05/11/19 1546  PHART 7.425 7.320*  PCO2ART 37.3 47.6  PO2ART 92.9 216*    Coag's No results for input(s): APTT, INR in the last 168 hours.  Sepsis Markers Recent Labs  Lab 05/08/19 1159 05/09/19 0439 05/10/19 0454  PROCALCITON 0.29 0.22 0.24    Cardiac Enzymes No results for input(s): TROPONINI, PROBNP in the last 168 hours.       The patient is critically ill with multiple organ systems failure and requires high complexity decision making for assessment and support, frequent evaluation and titration of therapies, application of advanced monitoring technologies and extensive interpretation of multiple databases. Critical Care Time devoted to patient care services described in this note is 38 minutes.   Christinia Gully, MD Pulmonary and [place] [phone] After 5:30 PM or weekends, use [facility] [phone]

## 2019-05-13 NOTE — Progress Notes (Signed)
ANTICOAGULATION CONSULT NOTE -   Pharmacy Consult for Heparin Indication: atrial fibrillation  Allergies  Allergen Reactions  . Ciprofloxacin     HIVES  . Adhesive [Tape] Other (See Comments)    Skin peels  . Codeine Other (See Comments)    Jitters, "skin crawling"    Patient Measurements: Height: 6\' 8"  (203.2 cm) Weight: 281 lb 12 oz (127.8 kg) IBW/kg (Calculated) : 96 HEPARIN DW (KG): 121  Vital Signs: Temp: 97.8 F (36.6 C) (02/10 0814) Temp Source: Axillary (02/10 0814) BP: 106/70 (02/10 0730) Pulse Rate: 42 (02/10 0730)  Labs: Recent Labs    05/11/19 0417 05/11/19 0829 05/12/19 0409 05/12/19 1326 05/13/19 0420  HGB 14.3   < > 13.4  --  13.3  HCT 43.0  --  40.9  --  40.9  PLT 121*  --  128*  --  104*  HEPARINUNFRC  --    < > 0.71* 0.61 0.72*  CREATININE 0.95  --  1.04  --   --    < > = values in this interval not displayed.    Estimated Creatinine Clearance: 107.4 mL/min (by C-G formula based on SCr of 1.04 mg/dL).   Medical History: Past Medical History:  Diagnosis Date  . Arthritis   . Atrial flutter ([place])   . BPH (benign prostatic hypertrophy) with urinary retention   . Cancer ([place])   . Chronic kidney disease    hx of kidney stones  2011  . COPD (chronic obstructive pulmonary disease) ([place])    has been smoking since he was 22 ish--  . High cholesterol   . Hypertension    dx around 2011  . Squamous cell carcinoma of right lung ([facility])     Medications:  Medications Prior to Admission  Medication Sig Dispense Refill Last Dose  . acetaminophen (TYLENOL) 500 MG tablet Take 1,000 mg by mouth every 6 (six) hours as needed for moderate pain.   unknown at Unknown time  . atorvastatin (LIPITOR) 10 MG tablet Take 10 mg by mouth every evening.    05/06/2019 at Unknown time  . CARTIA XT 180 MG 24 hr capsule Take 1 capsule by mouth once daily (Patient taking differently: Take 180 mg by mouth daily. ) 90 capsule 0 05/24/2019 at Unknown time  . Cyanocobalamin  (VITAMIN B-12 PO) Take 1 tablet by mouth daily.   05/13/2019 at Unknown time  . famotidine (PEPCID) 10 MG tablet Take 10 mg by mouth as needed for heartburn or indigestion.   05/06/2019 at Unknown time  . furosemide (LASIX) 40 MG tablet Take 1 tablet (40 mg total) by mouth daily as needed. 90 tablet 3 Past Week at Unknown time  . gabapentin (NEURONTIN) 300 MG capsule TAKE 1 CAPSULE BY MOUTH THREE TIMES DAILY (Patient taking differently: Take 300 mg by mouth 2 (two) times daily. ) 90 capsule 0 05/29/2019 at Unknown time  . loratadine (CLARITIN) 10 MG tablet Take 10 mg by mouth daily as needed for allergies.    unknown  . tamsulosin (FLOMAX) 0.4 MG CAPS capsule Take 0.4 mg by mouth daily.   05/10/2019 at Unknown time  . terbinafine ([employer] ATHLETES FOOT) 1 % cream Apply 1 application topically daily.   Past Week at Unknown time  . predniSONE (DELTASONE) 20 MG tablet Take 6 tablets daily for 2 weeks, then take 5 tablets daily for 1 week, then take 4 tablets for 1 week, then take 3 tablets for 1 week, then take 2 tablet for 1  week, then take 1 tablet for 1 week, then take 0.5 tablets for 1 week. 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 D-dimers were elevated, CTA chest with no evidence of PE, but significant for severe interstitial lung disease. Pharmacy asked to anticoagulate with heparin for afib. HL is 0.72, just slightly supratherapeutic  Goal of Therapy:  Heparin level 0.3-0.7 units/ml Monitor platelets by anticoagulation protocol: Yes   Plan:  Decrease  heparin infusion at 2000 units/hr Check anti-Xa level in ~6-8 and daily while on heparin Continue  to monitor H&H and platelets  Isac Sarna, BS [place], BCPS Clinical Pharmacist Pager [phone] 05/13/2019 9:22 AM

## 2019-05-13 NOTE — Consult Note (Signed)
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  He remains intubated at 40% FiO2.  He has a nosebleed from right nostril.  He continues to be on IV heparin.  He is sedated well.  He is off of pressors.  Objective: Vital signs in last 24 hours: Temp:  [97.1 F (36.2 C)-98 F (36.7 C)] 98 F (36.7 C) (02/10 1656) Pulse Rate:  [37-132] 40 (02/10 1830) Resp:  [16-31] 28 (02/10 1830) BP: (86-123)/(51-92) 117/82 (02/10 1830) SpO2:  [91 %-96 %] 91 % (02/10 1830) FiO2 (%):  [40 %-60 %] 40 % (02/10 1551) Weight:  [281 lb 12 oz (127.8 kg)] 281 lb 12 oz (127.8 kg) (02/10 0545)    Intake/Output from previous day: 02/09 0800 - 02/10 0759 In: 1876.8 [I.V.:663.8] Out: 2325 [Urine:2325]    Intake/Output this shift: Total I/O In: 4190.9 [I.V.:1012.4; IV Piggyback:3178.5] Out: 2000 [Urine:2000]   PHYSICAL EXAM: BP 117/82   Pulse (!) 40   Temp 98 F (36.7 C) (Oral)   Resp (!) 28   Ht 6\' 8"  (2.032 m)   Wt 281 lb 12 oz (127.8 kg)   SpO2 91%   BMI 30.95 kg/m  Lungs: Bilateral air entry. Heart: regular rate and rhythm Extremities: Trace edema bilateral. Neurologic: Intubated and sedated.   Studies/Results: Results for orders placed or performed during the hospital encounter of 05/26/2019 (from the past 48 hour(s))  Glucose, capillary     Status: Abnormal   Collection Time: 05/11/19  8:42 PM  Result Value Ref Range   Glucose-Capillary 201 (H) 70 - 99 mg/dL   Comment 1 Notify RN    Comment 2 Document in Chart   Glucose, capillary     Status: Abnormal   Collection Time: 05/11/19 11:49 PM  Result Value Ref Range   Glucose-Capillary 189 (H) 70 - 99 mg/dL  Glucose, capillary     Status: Abnormal   Collection Time: 05/12/19  3:12 AM  Result Value Ref Range    Glucose-Capillary 193 (H) 70 - 99 mg/dL  C-reactive protein     Status: Abnormal   Collection Time: 05/12/19  4:09 AM  Result Value Ref Range   CRP 13.8 (H) <1.0 mg/dL    Comment: Performed at [facility], [address] [place], [place] [address]  CBC     Status: Abnormal   Collection Time: 05/12/19  4:09 AM  Result Value Ref Range   WBC 14.5 (H) 4.0 - 10.5 K/uL   RBC 4.85 4.22 - 5.81 MIL/uL   Hemoglobin 13.4 13.0 - 17.0 g/dL   HCT 40.9 39.0 - 52.0 %   MCV 84.3 80.0 - 100.0 fL   MCH 27.6 26.0 - 34.0 pg   MCHC 32.8 30.0 - 36.0 g/dL   RDW 13.2 11.5 - 15.5 %   Platelets 128 (L) 150 - 400 K/uL   nRBC 0.0 0.0 - 0.2 %    Comment: Performed at [facility], [address], [place], [place] [address]  Heparin level (unfractionated)     Status: Abnormal   Collection Time: 05/12/19  4:09 AM  Result Value Ref Range   Heparin Unfractionated 0.71 (H) 0.30 - 0.70 IU/mL    Comment: (NOTE) If heparin results are below expected values, and patient dosage has  been confirmed, suggest follow up testing  of antithrombin III levels. Performed at [facility], [address], [place], [place] [address]   Comprehensive metabolic panel     Status: Abnormal   Collection Time: 05/12/19  4:09 AM  Result Value Ref Range   Sodium 127 (L) 135 - 145 mmol/L   Potassium 5.2 (H) 3.5 - 5.1 mmol/L    Comment: DELTA CHECK NOTED   Chloride 94 (L) 98 - 111 mmol/L   CO2 24 22 - 32 mmol/L   Glucose, Bld 253 (H) 70 - 99 mg/dL   BUN 39 (H) 8 - 23 mg/dL   Creatinine, Ser 1.04 0.61 - 1.24 mg/dL   Calcium 8.1 (L) 8.9 - 10.3 mg/dL   Total Protein 6.0 (L) 6.5 - 8.1 g/dL   Albumin 2.5 (L) 3.5 - 5.0 g/dL   AST 20 15 - 41 U/L   ALT 48 (H) 0 - 44 U/L   Alkaline Phosphatase 115 38 - 126 U/L   Total Bilirubin 0.7 0.3 - 1.2 mg/dL   GFR calc non Af Amer >60 >60 mL/min   GFR calc Af Amer >60 >60 mL/min   Anion gap 9 5 - 15    Comment: Performed at [facility], [address], [place], [place] [address]  Glucose, capillary      Status: Abnormal   Collection Time: 05/12/19  7:40 AM  Result Value Ref Range   Glucose-Capillary 214 (H) 70 - 99 mg/dL  Glucose, capillary     Status: Abnormal   Collection Time: 05/12/19 11:59 AM  Result Value Ref Range   Glucose-Capillary 226 (H) 70 - 99 mg/dL  Heparin level (unfractionated)     Status: None   Collection Time: 05/12/19  1:26 PM  Result Value Ref Range   Heparin Unfractionated 0.61 0.30 - 0.70 IU/mL    Comment: (NOTE) If heparin results are below expected values, and patient dosage has  been confirmed, suggest follow up testing of antithrombin III levels. Performed at [facility], [address] [place], [place] [address]   Glucose, capillary     Status: Abnormal   Collection Time: 05/12/19  4:12 PM  Result Value Ref Range   Glucose-Capillary 168 (H) 70 - 99 mg/dL  Glucose, capillary     Status: Abnormal   Collection Time: 05/12/19  8:02 PM  Result Value Ref Range   Glucose-Capillary 185 (H) 70 - 99 mg/dL   Comment 1 Notify RN    Comment 2 Document in Chart   Glucose, capillary     Status: Abnormal   Collection Time: 05/13/19 12:01 AM  Result Value Ref Range   Glucose-Capillary 196 (H) 70 - 99 mg/dL   Comment 1 Notify RN    Comment 2 Document in Chart   Glucose, capillary     Status: Abnormal   Collection Time: 05/13/19  3:49 AM  Result Value Ref Range   Glucose-Capillary 207 (H) 70 - 99 mg/dL   Comment 1 Notify RN    Comment 2 Document in Chart   C-reactive protein     Status: Abnormal   Collection Time: 05/13/19  4:20 AM  Result Value Ref Range   CRP 6.6 (H) <1.0 mg/dL    Comment: Performed at [facility], [address], [place], [place] [address]  CBC     Status: Abnormal   Collection Time: 05/13/19  4:20 AM  Result Value Ref Range   WBC 9.8 4.0 - 10.5 K/uL   RBC 4.78 4.22 - 5.81 MIL/uL   Hemoglobin 13.3  13.0 - 17.0 g/dL   HCT 40.9 39.0 - 52.0 %   MCV 85.6 80.0 - 100.0 fL   MCH 27.8 26.0 - 34.0 pg   MCHC 32.5 30.0 - 36.0 g/dL   RDW 13.2  11.5 - 15.5 %   Platelets 104 (L) 150 - 400 K/uL    Comment: PLATELET COUNT CONFIRMED BY SMEAR SPECIMEN CHECKED FOR CLOTS Immature Platelet Fraction may be clinically indicated, consider ordering this additional test [id number]    nRBC 0.0 0.0 - 0.2 %    Comment: Performed at [facility], [address] [place], [place] [address]  Heparin level (unfractionated)     Status: Abnormal   Collection Time: 05/13/19  4:20 AM  Result Value Ref Range   Heparin Unfractionated 0.72 (H) 0.30 - 0.70 IU/mL    Comment: (NOTE) If heparin results are below expected values, and patient dosage has  been confirmed, suggest follow up testing of antithrombin III levels. Performed at [facility], [address] [place], [place] [address]   Glucose, capillary     Status: Abnormal   Collection Time: 05/13/19  7:49 AM  Result Value Ref Range   Glucose-Capillary 206 (H) 70 - 99 mg/dL  Glucose, capillary     Status: Abnormal   Collection Time: 05/13/19 11:54 AM  Result Value Ref Range   Glucose-Capillary 170 (H) 70 - 99 mg/dL  Heparin level (unfractionated)     Status: None   Collection Time: 05/13/19  4:11 PM  Result Value Ref Range   Heparin Unfractionated 0.55 0.30 - 0.70 IU/mL    Comment: (NOTE) If heparin results are below expected values, and patient dosage has  been confirmed, suggest follow up testing of antithrombin III levels. Performed at [facility], [address] [place], [place] [address]   Glucose, capillary     Status: Abnormal   Collection Time: 05/13/19  4:59 PM  Result Value Ref Range   Glucose-Capillary 223 (H) 70 - 99 mg/dL   DG Abd 1 View  Result Date: 05/13/2019 CLINICAL DATA:  OG tube placement EXAM: ABDOMEN - 1 VIEW COMPARISON:  05/13/2019 FINDINGS: OG tube tip is in the stomach. Dilated small bowel loops concerning for small bowel obstruction. Gas also seen within nondistended colon. IMPRESSION: NG tube tip in the stomach. Concern for small bowel obstruction.  Electronically Signed   By: Rolm Baptise M.D.   On: 05/13/2019 18:10   DG Abd 1 View  Result Date: 05/13/2019 CLINICAL DATA:  Orogastric tube placement. EXAM: ABDOMEN - 1 VIEW COMPARISON:  May 11, 2019. FINDINGS: The bowel gas pattern is normal. Distal tip of enteric tube is seen in proximal stomach. No radio-opaque calculi or other significant radiographic abnormality are seen. IMPRESSION: Distal tip of enteric tube seen in proximal stomach. Electronically Signed   By: Marijo Conception M.D.   On: 05/13/2019 11:31   DG CHEST PORT 1 VIEW  Result Date: 05/12/2019 CLINICAL DATA:  Orogastric tube placement EXAM: PORTABLE CHEST 1 VIEW COMPARISON:  Yesterday FINDINGS: Endotracheal tube tips are the clavicular heads. The orogastric tube tip and side-port are at the stomach. Cardiomegaly and diffuse pulmonary opacity is unchanged. Right PICC with tip at the upper SVC. No visible effusion or air leak. IMPRESSION: 1. Unremarkable hardware positioning. 2. Unchanged extensive pulmonary opacity. Electronically Signed   By: Monte Fantasia M.D.   On: 05/12/2019 04:48   ECHOCARDIOGRAM COMPLETE  Result Date: 05/13/2019    ECHOCARDIOGRAM REPORT   Patient Name:   Aaron Sellers  Date of Exam: 05/13/2019 Medical Rec #:  [id number]    Height:       80.0 in Accession #:    [id number]   Weight:       281.7 lb Date of Birth:  02/08/1951    BSA:          2.66 m Patient Age:    42 years     BP:           107/71 mmHg Patient Gender: M            HR:           82 bpm. Exam Location:  [facility] Procedure: 2D Echo Indications:    Atrial Fibrillation 427.31 / I48.91  History:        Patient has prior history of Echocardiogram examinations, most                 recent 06/01/2017. COPD, Arrythmias:Atrial Fibrillation and Atrial                 Flutter; Risk Factors:Former Smoker, Dyslipidemia and                 Hypertension. Lung mass,Primary malignant neoplasm of right lung                 metastatic to other site.  Sonographer:     Leavy Cella RDCS (AE) Referring Phys: [id number] [place]  1. Left ventricular ejection fraction, by estimation, is 45%. The left ventricle has mildly decreased function. The left ventrical has no regional wall motion abnormalities. There is mildly increased left ventricular hypertrophy. Left ventricular diastolic parameters are indeterminate.  2. Right ventricular systolic function is normal. The right ventricular size is normal. There is mildly elevated pulmonary artery systolic pressure.  3. The mitral valve is normal in structure and function. trivial mitral valve regurgitation. No evidence of mitral stenosis.  4. The aortic valve is tricuspid. Aortic valve regurgitation is not visualized. No aortic stenosis is present. FINDINGS  Left Ventricle: Left ventricular ejection fraction, by estimation, is 45%. The left ventricle has mildly decreased function. The left ventricle has no regional wall motion abnormalities. There is mildly increased left ventricular hypertrophy. Left ventricular diastolic parameters are indeterminate. Right Ventricle: The right ventricular size is normal. No increase in right ventricular wall thickness. Right ventricular systolic function is normal. There is mildly elevated pulmonary artery systolic pressure. The tricuspid regurgitant velocity is 2.52  m/s, and with an assumed right atrial pressure of 10 mmHg, the estimated right ventricular systolic pressure is 94.8 mmHg. Left Atrium: Left atrial size was normal in size. Right Atrium: Right atrial size was normal in size. Pericardium: There is no evidence of pericardial effusion. Mitral Valve: The mitral valve is normal in structure and function. Trivial mitral valve regurgitation. No evidence of mitral valve stenosis. Tricuspid Valve: The tricuspid valve is normal in structure. Tricuspid valve regurgitation is mild . No evidence of tricuspid stenosis. Aortic Valve: The aortic valve is tricuspid. Aortic valve  regurgitation is not visualized. No aortic stenosis is present. Aortic valve mean gradient measures 4.9 mmHg. Aortic valve peak gradient measures 8.7 mmHg. Aortic valve area, by VTI measures 1.74 cm. Pulmonic Valve: The pulmonic valve was not well visualized. Pulmonic valve regurgitation is not visualized. No evidence of pulmonic stenosis. Aorta: The aortic root is normal in size and structure. Pulmonary Artery: Indeterminate PASP, inadequate TR jet. Venous: The inferior vena cava was not well visualized. IAS/Shunts:  No atrial level shunt detected by color flow Doppler.  LEFT VENTRICLE PLAX 2D LVIDd:         4.86 cm  Diastology LVIDs:         4.01 cm  LV e' lateral:   9.03 cm/s LV PW:         1.34 cm  LV E/e' lateral: 9.9 LV IVS:        1.15 cm  LV e' medial:    6.96 cm/s LVOT diam:     2.10 cm  LV E/e' medial:  12.9 LV SV:         47.58 ml LV SV Index:   14.87 LVOT Area:     3.46 cm  RIGHT VENTRICLE RV S prime:     11.10 cm/s TAPSE (M-mode): 1.8 cm LEFT ATRIUM             Index       RIGHT ATRIUM           Index LA diam:        2.90 cm 1.09 cm/m  RA Area:     19.00 cm LA Vol (A2C):   68.4 ml 25.71 ml/m RA Volume:   51.10 ml  19.21 ml/m LA Vol (A4C):   53.2 ml 20.00 ml/m LA Biplane Vol: 65.1 ml 24.47 ml/m  AORTIC VALVE AV Area (Vmax):    1.82 cm AV Area (Vmean):   1.65 cm AV Area (VTI):     1.74 cm AV Vmax:           147.80 cm/s AV Vmean:          106.122 cm/s AV VTI:            0.273 m AV Peak Grad:      8.7 mmHg AV Mean Grad:      4.9 mmHg LVOT Vmax:         77.61 cm/s LVOT Vmean:        50.669 cm/s LVOT VTI:          0.137 m LVOT/AV VTI ratio: 0.50  AORTA Ao Root diam: 3.40 cm MITRAL VALVE                        TRICUSPID VALVE MV Area (PHT): 4.80 cm             TR Peak grad:   25.4 mmHg MV Decel Time: 158 msec             TR Vmax:        252.00 cm/s MV E velocity: 89.70 cm/s 103 cm/s MV A velocity: 30.60 cm/s 70.3 cm/s SHUNTS MV E/A ratio:  2.93       1.5       Systemic VTI:  0.14 m                                      Systemic Diam: 2.10 cm Carlyle Dolly MD Electronically signed by Carlyle Dolly MD Signature Date/Time: 05/13/2019/3:45:36 PM    Final      MEDICATIONS: I have reviewed the patient's current medications.     Assessment/Plan:  1.  Acute respiratory failure: -On IV Solu-Medrol 80 mg every 8 hours. -Infliximab 5 mg/kg on 05/10/2019. -Continue azithromycin, cefepime and Bactrim. -Follow Fungitell assay. -Overall poor prognosis.  I have discussed with the patient's significant other.  Patient's 2  sons and her have decided to make him DNR but continue current management.  2.  Thrombocytopenia: -Platelet count today has decreased to 104.  At presentation platelet count is around 220. -Would remain on high suspicion for HIT.  Likely etiology of thrombocytopenia is antibiotics and pressors.  Has been off of pressors at this time.  3.  A. fib with RVR: -On heparin drip and diltiazem drip. -2D echocardiogram today shows EF 45%.  All questions were answered. The patient knows to call the clinic with any problems, questions or concerns. We can certainly see the patient much sooner if necessary.     Derek Jack

## 2019-05-14 ENCOUNTER — Inpatient Hospital Stay (HOSPITAL_COMMUNITY): Payer: Medicare Other

## 2019-05-14 DIAGNOSIS — J9601 Acute respiratory failure with hypoxia: Secondary | ICD-10-CM

## 2019-05-14 DIAGNOSIS — R0902 Hypoxemia: Secondary | ICD-10-CM

## 2019-05-14 LAB — BASIC METABOLIC PANEL
Anion gap: 7 (ref 5–15)
BUN: 39 mg/dL — ABNORMAL HIGH (ref 8–23)
BUN: 40 mg/dL — ABNORMAL HIGH (ref 8–23)
CO2: 25 mmol/L (ref 22–32)
CO2: 21 mmol/L — ABNORMAL LOW (ref 22–32)
Calcium: 8.5 mg/dL — ABNORMAL LOW (ref 8.9–10.3)
Calcium: 8.3 mg/dL — ABNORMAL LOW (ref 8.9–10.3)
Chloride: 103 mmol/L (ref 98–111)
Chloride: 97 mmol/L — ABNORMAL LOW (ref 98–111)
Creatinine, Ser: 0.98 mg/dL (ref 0.61–1.24)
Creatinine, Ser: 0.9 mg/dL (ref 0.61–1.24)
GFR calc Af Amer: >60 mL/min (ref >60)
GFR calc Af Amer: >60 mL/min (ref >60)
GFR calc non Af Amer: >60 mL/min (ref >60)
GFR calc non Af Amer: >60 mL/min (ref >60)
Glucose, Bld: 192 mg/dL — ABNORMAL HIGH (ref 70–99)
Glucose, Bld: 302 mg/dL — ABNORMAL HIGH (ref 70–99)
Potassium: 6.2 mmol/L — ABNORMAL HIGH (ref 3.5–5.1)
Potassium: 6 mmol/L — ABNORMAL HIGH (ref 3.5–5.1)
Sodium: 128 mmol/L — ABNORMAL LOW (ref 135–145)
Sodium: 125 mmol/L — ABNORMAL LOW (ref 135–145)

## 2019-05-14 LAB — GLUCOSE, CAPILLARY
Glucose-Capillary: 189 mg/dL — ABNORMAL HIGH (ref 70–99)
Glucose-Capillary: 213 mg/dL — ABNORMAL HIGH (ref 70–99)
Glucose-Capillary: 214 mg/dL — ABNORMAL HIGH (ref 70–99)
Glucose-Capillary: 236 mg/dL — ABNORMAL HIGH (ref 70–99)
Glucose-Capillary: 255 mg/dL — ABNORMAL HIGH (ref 70–99)

## 2019-05-14 LAB — CBC
HCT: 43.8 % (ref 39.0–52.0)
Hemoglobin: 14.1 g/dL (ref 13.0–17.0)
MCH: 27.7 pg (ref 26.0–34.0)
MCHC: 32.2 g/dL (ref 30.0–36.0)
MCV: 86.1 fL (ref 80.0–100.0)
Platelets: 119 10*3/uL — ABNORMAL LOW (ref 150–400)
RBC: 5.09 MIL/uL (ref 4.22–5.81)
RDW: 13.3 % (ref 11.5–15.5)
WBC: 9.2 10*3/uL (ref 4.0–10.5)
nRBC: 0 % (ref 0.0–0.2)

## 2019-05-14 LAB — HEPARIN LEVEL (UNFRACTIONATED)
Heparin Unfractionated: 1.02 IU/mL — ABNORMAL HIGH (ref 0.30–0.70)
Heparin Unfractionated: 0.44 IU/mL (ref 0.30–0.70)

## 2019-05-14 LAB — MAGNESIUM: Magnesium: 1.9 mg/dL (ref 1.7–2.4)

## 2019-05-14 MED ORDER — LORAZEPAM 2 MG/ML IJ SOLN
1.0000 mg | INTRAMUSCULAR | Status: DC
  Administered 2019-05-14 – 2019-05-15 (×5): 1 mg via INTRAVENOUS
  Filled 2019-05-14 (×5): qty 1

## 2019-05-14 MED ORDER — SODIUM CHLORIDE 0.9 % IV SOLN: INTRAVENOUS | Status: DC

## 2019-05-14 MED ORDER — METHYLPREDNISOLONE SODIUM SUCC 125 MG IJ SOLR
80.0000 mg | Freq: Two times a day (BID) | INTRAMUSCULAR | Status: DC
  Administered 2019-05-15: 01:00:00 80 mg via INTRAVENOUS
  Filled 2019-05-14: qty 2

## 2019-05-14 MED ORDER — SODIUM POLYSTYRENE SULFONATE 15 GM/60ML PO SUSP
30.0000 g | Freq: Once | ORAL | Status: AC
  Administered 2019-05-14: 30 g
  Filled 2019-05-14: qty 120

## 2019-05-14 MED ORDER — METOCLOPRAMIDE HCL 5 MG/ML IJ SOLN
5.0000 mg | Freq: Four times a day (QID) | INTRAMUSCULAR | Status: DC
  Administered 2019-05-14 – 2019-05-15 (×4): 5 mg via INTRAVENOUS
  Filled 2019-05-14 (×4): qty 2

## 2019-05-14 MED ORDER — VORICONAZOLE 200 MG IV SOLR
6.0000 mg/kg | Freq: Two times a day (BID) | INTRAVENOUS | Status: DC
  Filled 2019-05-14 (×2): qty 780

## 2019-05-14 MED ORDER — LORAZEPAM 2 MG/ML IJ SOLN
1.0000 mg | INTRAMUSCULAR | Status: DC | PRN
  Administered 2019-05-15: 2 mg via INTRAVENOUS
  Filled 2019-05-14: qty 1

## 2019-05-14 MED ORDER — OXYMETAZOLINE HCL 0.05 % NA SOLN
1.0000 | Freq: Two times a day (BID) | NASAL | Status: DC | PRN
  Filled 2019-05-14: qty 15

## 2019-05-14 MED ORDER — CALCIUM GLUCONATE-NACL 1-0.675 GM/50ML-% IV SOLN
1.0000 g | Freq: Once | INTRAVENOUS | Status: AC
  Administered 2019-05-14: 1000 mg via INTRAVENOUS
  Filled 2019-05-14: qty 50

## 2019-05-14 MED ORDER — BISACODYL 10 MG RE SUPP
10.0000 mg | Freq: Two times a day (BID) | RECTAL | Status: DC
  Administered 2019-05-14 – 2019-05-15 (×2): 10 mg via RECTAL
  Filled 2019-05-14 (×2): qty 1

## 2019-05-14 MED ORDER — CLONAZEPAM 0.5 MG PO TABS
1.0000 mg | ORAL_TABLET | Freq: Four times a day (QID) | ORAL | Status: DC
  Filled 2019-05-14: qty 2

## 2019-05-14 MED ORDER — HYDROCORTISONE NA SUCCINATE PF 100 MG IJ SOLR
100.0000 mg | Freq: Two times a day (BID) | INTRAMUSCULAR | Status: DC
  Administered 2019-05-14 – 2019-05-15 (×2): 100 mg via INTRAVENOUS
  Filled 2019-05-14 (×2): qty 2

## 2019-05-14 NOTE — Progress Notes (Signed)
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 Patient states that he has been having shortness of breath with palpitations and at times was lightheaded. Denies any chest pain. He was found to be in A. fib with RVR, admitted to stepdown for heart rate control on Cardizem drip, as well patient had progressive hypoxia within first 24 hours on admission, which has escalated rapidly, he D-dimers were elevated, CTA chest with no evidence of PE, but significant for severe interstitial lung disease, high clinical suspicion for Keytruda pneumonitis, received infliximab 2/7 patient with increased work of breathing on BiPAP, more lethargic, required intubationon2/8.  2/10: Plan to continue current antibiotics as well as high-dose steroids.  Diltiazem being weaned by cardiology with use of digoxin as well as Lopressor.  2D echocardiogram obtained and pending.  Pressors have been weaned off.  Continue on heparin drip in case cardioversion is required and monitor platelet counts closely.  2/11: Continue current antibiotics and high-dose steroids.  Appreciate cardiology recommendations with IV Lopressor and digoxin.  Continue heparin drip.  Correct hyperkalemia with Kayexalate and monitor closely.  He is having worsening abdominal distention with ileus/SBO confirmed on imaging.  We will keep on low intermittent suction in order Dulcolax suppositories.  Assessment & Plan:   Active Problems:   Atrial fibrillation with rapid ventricular response (HCC)   Primary malignant neoplasm of  right lung metastatic to other site [facility])   Pneumonitis   Goals of care, counseling/discussion   Advanced care planning/counseling discussion   Palliative care by specialist   Acute respiratory distress syndrome (ARDS) (HCC)   Thrombocytopenia (HCC)   Acute hypoxic respiratory failure. -Patient with rapid increase of oxygen requirement, on BiPAP for last 24 hours, this morning is more lethargic, increased work of breathing, he required intubation . -No clear etiology, infectious versus inflammatory . -Highly suspicious for Keytruda pneumonitis, no improvement with IV steroids, received infliximab 5 mg/kg x1 dose. 2/7 continue with IV Solu-Medrol at 80 mg IV every 8 hours.  -Procalcitonin 0.24, which is reassuring. -Continue azithromycin and cefepime for atypical infection coverage -Vancomycin discontinued on 05/11/19 -at this point patient is unstable for bronchoscopy given high oxygen requirement  -No evidence of volume overload . -follow Fungitell , LDH is elevated, viral panel is negative possibly related to infectious etiology, versus inflammatory etiology. -D-dimer is elevated, but CTA chest was negative for PE. -COVID-19 negative.  -Vent management and sedation per PCCM, continue ARDS protocol -BAL performed for PCP and fungus pending -Versed drip initiated for additional sedation  A. fib with RVR -Appreciate cardiology management with IV digoxin and Lopressor ongoing -continue heparin drip for possible cardioversion -Continue telemetry monitoring -amiodarone drip discontinued on 05/11/19 due to concerns about lung status -2D echocardiogram performed with results pending  Hyperkalemia -Administer Kayexalate and recheck labs in a.m. -Monitor on telemetry  Hyponatremia -Normal saline initiated -We will follow in a.m.  Abdominal distention worsening with concern for SBO/ileus -We will discontinue tube feeds and start Dulcolax suppositories twice daily -Start OG tube to  low intermittent suction and monitor -Repeat KUB tomorrow and consider general surgery consultation as needed  Hypotension -Weaned  off norepinephrine 2/10  Stage IV squamous cell carcinoma -Status post palliative radiotherapy and systemic chemotherapy with further treatment being held -patient followed by oncology as outpatient. -will follow oncology rec's  Acute thrombocytopenia-currently stable -Continue to monitor closely while on heparin drip and discontinue as needed  DVT prophylaxis: Heparin drip Code Status: DNR Family Communication: None at bedside Disposition Plan: Continue ventilator support per PCCM as well as heart rate control per cardiology.  Appreciate palliative care for family discussions and patient is now DNR.   Hold further tube feeds for now and start to low intermittent suction.  Try Dulcolax suppositories.   Consultants:   Cardiology  PCCM  Oncology  Palliative care  Procedures:   2/6 PICC line  2/6 ET tube  Antimicrobials:  Anti-infectives (From admission, onward)   Start     Dose/Rate Route Frequency Ordered Stop   05/09/19 1500  sulfamethoxazole-trimethoprim (BACTRIM) 400.8 mg in dextrose 5 % 500 mL IVPB     15 mg/kg/day  106.9 kg (Adjusted) 350 mL/hr over 90 Minutes Intravenous Every 6 hours 05/09/19 1428     05/09/19 0800  vancomycin (VANCOREADY) IVPB 1250 mg/250 mL  Status:  Discontinued     1,250 mg 166.7 mL/hr over 90 Minutes Intravenous Every 12 hours 05/08/19 1725 05/09/19 1412   05/08/19 1900  vancomycin (VANCOREADY) IVPB 2000 mg/400 mL     2,000 mg 200 mL/hr over 120 Minutes Intravenous  Once 05/08/19 1720 05/08/19 2145   05/08/19 1800  ceFEPIme (MAXIPIME) 2 g in sodium chloride 0.9 % 100 mL IVPB     2 g 200 mL/hr over 30 Minutes Intravenous Every 8 hours 05/08/19 1719     05/08/19 1445  azithromycin (ZITHROMAX) 500 mg in sodium chloride 0.9 % 250 mL IVPB     500 mg 250 mL/hr over 60 Minutes Intravenous Every 24 hours  05/08/19 1440     05/08/19 1445  cefTRIAXone (ROCEPHIN) 2 g in sodium chloride 0.9 % 100 mL IVPB  Status:  Discontinued     2 g 200 mL/hr over 30 Minutes Intravenous Every 24 hours 05/08/19 1440 05/08/19 1719       Subjective: Patient seen and evaluated today with no new acute complaints or concerns.  He has had some worsening abdominal distention noted overnight with no bowel movements recently.  He is noted to be hyperkalemic.  Objective: Vitals:   05/14/19 1400 05/14/19 1415 05/14/19 1430 05/14/19 1500  BP: 101/88 101/83 104/84 117/86  Pulse: (!) 126 (!) 129 (!) 123 (!) 128  Resp: (!) 22 (!) 21 (!) 25 (!) 21  Temp:      TempSrc:      SpO2:  (!) 89% (!) 89% (!) 88%  Weight:      Height:        Intake/Output Summary (Last 24 hours) at 05/14/2019 1529 Last data filed at 05/14/2019 1500 Gross per 24 hour  Intake 5411.51 ml  Output 4400 ml  Net 1011.51 ml   Filed Weights   05/12/19 0500 05/13/19 0545 05/14/19 0500  Weight: 127.1 kg 127.8 kg 129.4 kg    Examination:  General exam: Appears calm and comfortable, sedated Respiratory system: Clear to auscultation. Respiratory effort normal.  Intubated on FiO2 50% Cardiovascular system: S1 & S2 heard, RRR. No JVD, murmurs, rubs, gallops or clicks. No pedal edema. Gastrointestinal system: Abdomen is nondistended, soft and nontender. No organomegaly or masses felt. Normal bowel sounds heard.  Tube feeds ongoing. Central nervous system: Sedated on ventilator Extremities: Symmetric  5 x 5 power. Skin: No rashes, lesions or ulcers Psychiatry: Cannot be assessed given patient condition    Data Reviewed: I have personally reviewed following labs and imaging studies  CBC: Recent Labs  Lab 05/31/2019 1719 05/08/19 0402 05/10/19 0454 05/11/19 0417 05/12/19 0409 05/13/19 0420 05/14/19 0317  WBC 12.1*   < > 18.6* 15.0* 14.5* 9.8 9.2  NEUTROABS 10.3*  --   --   --   --   --   --   HGB 16.0   < > 14.7 14.3 13.4 13.3 14.1  HCT  48.5   < > 44.6 43.0 40.9 40.9 43.8  MCV 86.6   < > 84.8 85.0 84.3 85.6 86.1  PLT 229   < > 166 121* 128* 104* 119*   < > = values in this interval not displayed.   Basic Metabolic Panel: Recent Labs  Lab 05/08/19 1159 05/09/19 0439 05/10/19 0454 05/11/19 0417 05/12/19 0409 05/14/19 0317 05/14/19 0941  NA  --    < > 133* 129* 127* 128* 125*  K  --    < > 3.9 4.1 5.2* 6.2* 6.0*  CL  --    < > 95* 94* 94* 103 97*  CO2  --    < > '24 22 24 25 '$ 21*  GLUCOSE  --    < > 181* 271* 253* 192* 302*  BUN  --    < > 25* 31* 39* 39* 40*  CREATININE  --    < > 0.73 0.95 1.04 0.98 0.90  CALCIUM  --    < > 8.6* 8.5* 8.1* 8.5* 8.3*  MG 2.0  --   --   --   --   --   --    < > = values in this interval not displayed.   GFR: Estimated Creatinine Clearance: 124.9 mL/min (by C-G formula based on SCr of 0.9 mg/dL). Liver Function Tests: Recent Labs  Lab 05/06/2019 1719 05/08/19 0402 05/12/19 0409  AST 31 38 20  ALT 38 40 48*  ALKPHOS 85 73 115  BILITOT 1.9* 2.9* 0.7  PROT 7.1 7.3 6.0*  ALBUMIN 3.4* 3.2* 2.5*   No results for input(s): LIPASE, AMYLASE in the last 168 hours. No results for input(s): AMMONIA in the last 168 hours. Coagulation Profile: No results for input(s): INR, PROTIME in the last 168 hours. Cardiac Enzymes: No results for input(s): CKTOTAL, CKMB, CKMBINDEX, TROPONINI in the last 168 hours. BNP (last 3 results) No results for input(s): PROBNP in the last 8760 hours. HbA1C: No results for input(s): HGBA1C in the last 72 hours. CBG: Recent Labs  Lab 05/13/19 1659 05/13/19 2013 05/14/19 0041 05/14/19 0804 05/14/19 1122  GLUCAP 223* 169* 213* 236* 255*   Lipid Profile: No results for input(s): CHOL, HDL, LDLCALC, TRIG, CHOLHDL, LDLDIRECT in the last 72 hours. Thyroid Function Tests: No results for input(s): TSH, T4TOTAL, FREET4, T3FREE, THYROIDAB in the last 72 hours. Anemia Panel: No results for input(s): VITAMINB12, FOLATE, FERRITIN, TIBC, IRON, RETICCTPCT in the  last 72 hours. Sepsis Labs: Recent Labs  Lab 05/08/19 1159 05/09/19 0439 05/10/19 0454  PROCALCITON 0.29 0.22 0.24    Recent Results (from the past 240 hour(s))  SARS CORONAVIRUS 2 (TAT 6-24 HRS) Nasopharyngeal Nasopharyngeal Swab     Status: None   Collection Time: 05/05/2019  7:36 PM   Specimen: Nasopharyngeal Swab  Result Value Ref Range Status   SARS Coronavirus 2 NEGATIVE NEGATIVE Final    Comment: (NOTE) SARS-CoV-2 target  nucleic acids are NOT DETECTED. The SARS-CoV-2 RNA is generally detectable in upper and lower respiratory specimens during the acute phase of infection. Negative results do not preclude SARS-CoV-2 infection, do not rule out co-infections with other pathogens, and should not be used as the sole basis for treatment or other patient management decisions. Negative results must be combined with clinical observations, patient history, and epidemiological information. The expected result is Negative. Fact Sheet for Patients: SugarRoll.be Fact Sheet for Healthcare Providers: https://www.woods-mathews.com/ This test is not yet approved or cleared by the [place] FDA and  has been authorized for detection and/or diagnosis of SARS-CoV-2 by FDA under an Emergency Use Authorization (EUA). This EUA will remain  in effect (meaning this test can be used) for the duration of the COVID-19 declaration under Section 56 4(b)(1) of the Act, 21 U.S.C. section 360bbb-3(b)(1), unless the authorization is terminated or revoked sooner. Performed at [facility], [place] [address], [place], [place] [address]   MRSA PCR Screening     Status: None   Collection Time: 05/08/19  3:08 AM   Specimen: Nasal Mucosa; Nasopharyngeal  Result Value Ref Range Status   MRSA by PCR NEGATIVE NEGATIVE Final    Comment:        The GeneXpert MRSA Assay (FDA approved for NASAL specimens only), is one component of a comprehensive MRSA  colonization surveillance program. It is not intended to diagnose MRSA infection nor to guide or monitor treatment for MRSA infections. Performed at [facility], [address] [place], [place] [address]   Respiratory Panel by PCR     Status: None   Collection Time: 05/09/19  2:05 PM   Specimen: Nasopharyngeal Swab; Respiratory  Result Value Ref Range Status   Adenovirus NOT DETECTED NOT DETECTED Final   Coronavirus 229E NOT DETECTED NOT DETECTED Final    Comment: (NOTE) The Coronavirus on the Respiratory Panel, DOES NOT test for the novel  Coronavirus (2019 nCoV)    Coronavirus HKU1 NOT DETECTED NOT DETECTED Final   Coronavirus NL63 NOT DETECTED NOT DETECTED Final   Coronavirus OC43 NOT DETECTED NOT DETECTED Final   Metapneumovirus NOT DETECTED NOT DETECTED Final   Rhinovirus / Enterovirus NOT DETECTED NOT DETECTED Final   Influenza A NOT DETECTED NOT DETECTED Final   Influenza B NOT DETECTED NOT DETECTED Final   Parainfluenza Virus 1 NOT DETECTED NOT DETECTED Final   Parainfluenza Virus 2 NOT DETECTED NOT DETECTED Final   Parainfluenza Virus 3 NOT DETECTED NOT DETECTED Final   Parainfluenza Virus 4 NOT DETECTED NOT DETECTED Final   Respiratory Syncytial Virus NOT DETECTED NOT DETECTED Final   Bordetella pertussis NOT DETECTED NOT DETECTED Final   Chlamydophila pneumoniae NOT DETECTED NOT DETECTED Final   Mycoplasma pneumoniae NOT DETECTED NOT DETECTED Final    Comment: Performed at [facility], [place] [address], [place], [place] [address]  Expectorated sputum assessment w rflx to resp cult     Status: None   Collection Time: 05/09/19  5:30 PM   Specimen: Expectorated Sputum  Result Value Ref Range Status   Specimen Description EXPECTORATED SPUTUM  Final   Special Requests Immunocompromised  Final   Sputum evaluation   Final    THIS SPECIMEN IS ACCEPTABLE FOR SPUTUM CULTURE Performed at [facility], [address], [place], [place] [address]    Report Status  05/09/2019 FINAL  Final  Culture, respiratory     Status: None   Collection Time: 05/09/19  5:30 PM  Result  Value Ref Range Status   Specimen Description   Final    EXPECTORATED SPUTUM Performed at [facility], [address], [place], [place] [address]    Special Requests   Final    Immunocompromised Reflexed from [id number] Performed at [facility] [address], [place], [place] [address]    Gram Stain   Final    RARE WBC PRESENT, PREDOMINANTLY PMN RARE GRAM POSITIVE COCCI IN PAIRS RARE BUDDING YEAST SEEN FEW SQUAMOUS EPITHELIAL CELLS PRESENT    Culture   Final    FEW Consistent with normal respiratory flora. Performed at [facility], [place] [address] [place], [place] [address]    Report Status 05/12/2019 FINAL  Final         Radiology Studies: DG Abd 1 View  Result Date: 05/14/2019 CLINICAL DATA:  Abdominal distension. EXAM: ABDOMEN - 1 VIEW COMPARISON:  May 13, 2019. FINDINGS: Mildly dilated small bowel loops are noted concerning for ileus or possibly distal small bowel obstruction. Nasogastric tube tip is seen in proximal stomach. No colonic dilatation is noted. No radio-opaque calculi or other significant radiographic abnormality are seen. IMPRESSION: Nasogastric tube tip seen in proximal stomach. Mildly dilated small bowel loops are noted concerning for ileus or possibly distal small bowel obstruction. Electronically Signed   By: Marijo Conception M.D.   On: 05/14/2019 14:45   DG Abd 1 View  Result Date: 05/13/2019 CLINICAL DATA:  OG tube placement EXAM: ABDOMEN - 1 VIEW COMPARISON:  05/13/2019 FINDINGS: OG tube tip is in the stomach. Dilated small bowel loops concerning for small bowel obstruction. Gas also seen within nondistended colon. IMPRESSION: NG tube tip in the stomach. Concern for small bowel obstruction. Electronically Signed   By: Rolm Baptise M.D.   On: 05/13/2019 18:10   DG Abd 1 View  Result Date: 05/13/2019 CLINICAL DATA:  Orogastric tube  placement. EXAM: ABDOMEN - 1 VIEW COMPARISON:  May 11, 2019. FINDINGS: The bowel gas pattern is normal. Distal tip of enteric tube is seen in proximal stomach. No radio-opaque calculi or other significant radiographic abnormality are seen. IMPRESSION: Distal tip of enteric tube seen in proximal stomach. Electronically Signed   By: Marijo Conception M.D.   On: 05/13/2019 11:31   ECHOCARDIOGRAM COMPLETE  Result Date: 05/13/2019    ECHOCARDIOGRAM REPORT   Patient Name:   Aaron Sellers Date of Exam: 05/13/2019 Medical Rec #:  [id number]    Height:       80.0 in Accession #:    [id number]   Weight:       281.7 lb Date of Birth:  [birth year]    BSA:          2.66 m Patient Age:    70 years     BP:           107/71 mmHg Patient Gender: M            HR:           82 bpm. Exam Location:  [facility] Procedure: 2D Echo Indications:    Atrial Fibrillation 427.31 / I48.91  History:        Patient has prior history of Echocardiogram examinations, most                 recent 06/01/2017. COPD, Arrythmias:Atrial Fibrillation and Atrial                 Flutter; Risk Factors:Former Smoker, Dyslipidemia and  Hypertension. Lung mass,Primary malignant neoplasm of right lung                 metastatic to other site.  Sonographer:    Leavy Cella RDCS (AE) Referring Phys: [id number] [place]  1. Left ventricular ejection fraction, by estimation, is 45%. The left ventricle has mildly decreased function. The left ventrical has no regional wall motion abnormalities. There is mildly increased left ventricular hypertrophy. Left ventricular diastolic parameters are indeterminate.  2. Right ventricular systolic function is normal. The right ventricular size is normal. There is mildly elevated pulmonary artery systolic pressure.  3. The mitral valve is normal in structure and function. trivial mitral valve regurgitation. No evidence of mitral stenosis.  4. The aortic valve is tricuspid. Aortic valve  regurgitation is not visualized. No aortic stenosis is present. FINDINGS  Left Ventricle: Left ventricular ejection fraction, by estimation, is 45%. The left ventricle has mildly decreased function. The left ventricle has no regional wall motion abnormalities. There is mildly increased left ventricular hypertrophy. Left ventricular diastolic parameters are indeterminate. Right Ventricle: The right ventricular size is normal. No increase in right ventricular wall thickness. Right ventricular systolic function is normal. There is mildly elevated pulmonary artery systolic pressure. The tricuspid regurgitant velocity is 2.52  m/s, and with an assumed right atrial pressure of 10 mmHg, the estimated right ventricular systolic pressure is 50.3 mmHg. Left Atrium: Left atrial size was normal in size. Right Atrium: Right atrial size was normal in size. Pericardium: There is no evidence of pericardial effusion. Mitral Valve: The mitral valve is normal in structure and function. Trivial mitral valve regurgitation. No evidence of mitral valve stenosis. Tricuspid Valve: The tricuspid valve is normal in structure. Tricuspid valve regurgitation is mild . No evidence of tricuspid stenosis. Aortic Valve: The aortic valve is tricuspid. Aortic valve regurgitation is not visualized. No aortic stenosis is present. Aortic valve mean gradient measures 4.9 mmHg. Aortic valve peak gradient measures 8.7 mmHg. Aortic valve area, by VTI measures 1.74 cm. Pulmonic Valve: The pulmonic valve was not well visualized. Pulmonic valve regurgitation is not visualized. No evidence of pulmonic stenosis. Aorta: The aortic root is normal in size and structure. Pulmonary Artery: Indeterminate PASP, inadequate TR jet. Venous: The inferior vena cava was not well visualized. IAS/Shunts: No atrial level shunt detected by color flow Doppler.  LEFT VENTRICLE PLAX 2D LVIDd:         4.86 cm  Diastology LVIDs:         4.01 cm  LV e' lateral:   9.03 cm/s LV PW:          1.34 cm  LV E/e' lateral: 9.9 LV IVS:        1.15 cm  LV e' medial:    6.96 cm/s LVOT diam:     2.10 cm  LV E/e' medial:  12.9 LV SV:         47.58 ml LV SV Index:   14.87 LVOT Area:     3.46 cm  RIGHT VENTRICLE RV S prime:     11.10 cm/s TAPSE (M-mode): 1.8 cm LEFT ATRIUM             Index       RIGHT ATRIUM           Index LA diam:        2.90 cm 1.09 cm/m  RA Area:     19.00 cm LA Vol (A2C):   68.4 ml 25.71 ml/m  RA Volume:   51.10 ml  19.21 ml/m LA Vol (A4C):   53.2 ml 20.00 ml/m LA Biplane Vol: 65.1 ml 24.47 ml/m  AORTIC VALVE AV Area (Vmax):    1.82 cm AV Area (Vmean):   1.65 cm AV Area (VTI):     1.74 cm AV Vmax:           147.80 cm/s AV Vmean:          106.122 cm/s AV VTI:            0.273 m AV Peak Grad:      8.7 mmHg AV Mean Grad:      4.9 mmHg LVOT Vmax:         77.61 cm/s LVOT Vmean:        50.669 cm/s LVOT VTI:          0.137 m LVOT/AV VTI ratio: 0.50  AORTA Ao Root diam: 3.40 cm MITRAL VALVE                        TRICUSPID VALVE MV Area (PHT): 4.80 cm             TR Peak grad:   25.4 mmHg MV Decel Time: 158 msec             TR Vmax:        252.00 cm/s MV E velocity: 89.70 cm/s 103 cm/s MV A velocity: 30.60 cm/s 70.3 cm/s SHUNTS MV E/A ratio:  2.93       1.5       Systemic VTI:  0.14 m                                     Systemic Diam: 2.10 cm Carlyle Dolly MD Electronically signed by Carlyle Dolly MD Signature Date/Time: 05/13/2019/3:45:36 PM    Final         Scheduled Meds: . atorvastatin  10 mg Oral QPM  . bisacodyl  10 mg Rectal BID  . chlorhexidine gluconate (MEDLINE KIT)  15 mL Mouth Rinse BID  . Chlorhexidine Gluconate Cloth  6 each Topical Daily  . digoxin  0.125 mg Intravenous Daily  . feeding supplement (PRO-STAT SUGAR FREE 64)  30 mL Oral TID  . gabapentin  300 mg Oral BID  . insulin aspart  0-5 Units Subcutaneous QHS  . insulin aspart  0-9 Units Subcutaneous TID WC  . mouth rinse  15 mL Mouth Rinse 10 times per day  . methylPREDNISolone (SOLU-MEDROL)  injection  80 mg Intravenous Q8H  . metoprolol tartrate  2.5 mg Intravenous Q8H  . pantoprazole (PROTONIX) IV  40 mg Intravenous Q24H  . sodium chloride flush  10 mL Intravenous Q12H  . sodium chloride flush  3 mL Intravenous Q12H   Continuous Infusions: . sodium chloride    . sodium chloride Stopped (05/14/19 1403)  . azithromycin 500 mg (05/14/19 1509)  . ceFEPime (MAXIPIME) IV Stopped (05/14/19 1116)  . dexmedetomidine (PRECEDEX) IV infusion 1.2 mcg/kg/hr (05/14/19 1500)  . fentaNYL 10 mcg/ml infusion 20 mcg/hr (05/14/19 1500)  . heparin 1,700 Units/hr (05/14/19 1500)  . midazolam 0.5 mg/hr (05/14/19 1500)  . sulfamethoxazole-trimethoprim Stopped (05/14/19 0954)     LOS: 7 days    Time spent: 30 minutes    Yicel Shannon Darleen Crocker, DO [facility] Pager [phone]  If 7PM-7AM, please contact night-coverage [email or web address] Password [email or web address] 05/14/2019, 3:29  PM

## 2019-05-14 NOTE — Progress Notes (Signed)
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     HISTORY OF PRESENT ILLNESS   Not able to give hx as intubated  Per chart:  69 y.o. 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   In the ED patient was found to be in A. fib with RVR, started on IV Cardizem but gradually worse 1/8 and required ET and PCCM consulted    SIGNIFICANT PAST MEDICAL HISTORY   DIAGNOSIS: Stage IV (T2a,N3,M1c)non-small cell lung cancer, squamous cell carcinoma presented with right upper lobe lung mass in addition to right hilar and mediastinal adenopathy as well as metastatic disease in the medial right thigh diagnosed in November 2018.  PRIOR THERAPY: 1) Palliative radiotherapy to the right lower extremity mass completed April 09, 2017. 2) Systemic chemotherapy with carboplatin for AUC of 5, paclitaxel 175 mg/M2 and Keytruda 200 mg IV every 3 weeks. First dose March 12, 2017, status post 4 cycles with partial response.  CURRENT THERAPY: Maintenance treatment with single agent Keytruda 200 mg IV every 3 weeks. First cycle June 11, 2017. Status post 35cycles completed 03/02/2019  the patient's scan on 02/06/2019  showedinterval increase in size and number of diffuse bilateral peribronchovascular groundglass nodularity suspicious for atypical  pneumonia/metastatic disease but also could be immunotherapy mediated inflammatory process. He was started on doxycycline and prednisone. Hecompletedthe course of antibiotics.He completed his prednisone taper   SIGNIFICANT EVENTS:  Admit 05/29/2019 Intubate 05/11/19   Infliximab 2/7 >>>    STUDIES:   CTa 2/5  1. Marked severity diffuse bilateral ground-glass appearing infiltrates. 2. No evidence of pulmonary embolism. Echo 2/10 >>>  EF 45% mild LVH      CULTURES:  Sputum 2/6 >>> nl flora  Resp PCR panel 2/6  > neg  Covid PCR  2/4 neg  MRSA  PCR 2/5 neg  ET fungus 2/10 ET PCP  2/10   ANTIBIOTICS:  Vanc 2/5-2/6 Zmax  2/5 >>> maxepime 2/5 >>> Bactrim 2/6 >>>   LINES/TUBES:  PIC RUE  2/6 >>> Oral ET 2/8  >>>  CONSULTANTS:  Cards 2/5    Scheduled Meds: . atorvastatin  10 mg Oral QPM  . bisacodyl  10 mg Rectal BID  . chlorhexidine gluconate (MEDLINE KIT)  15 mL Mouth Rinse BID  . Chlorhexidine Gluconate Cloth  6 each Topical Daily  . digoxin  0.125 mg Intravenous Daily  . feeding supplement (PRO-STAT SUGAR FREE 64)  30 mL Oral TID  . gabapentin  300 mg Oral BID  . insulin aspart  0-5 Units Subcutaneous QHS  . insulin aspart  0-9 Units Subcutaneous TID WC  . mouth rinse  15 mL Mouth Rinse 10 times per day  . methylPREDNISolone (SOLU-MEDROL) injection  80 mg Intravenous Q8H  . metoprolol tartrate  2.5 mg Intravenous  Q8H  . pantoprazole (PROTONIX) IV  40 mg Intravenous Q24H  . sodium chloride flush  10 mL Intravenous Q12H  . sodium chloride flush  3 mL Intravenous Q12H   Continuous Infusions: . sodium chloride    . sodium chloride Stopped (05/14/19 1403)  . azithromycin 500 mg (05/14/19 1509)  . ceFEPime (MAXIPIME) IV Stopped (05/14/19 1116)  . dexmedetomidine (PRECEDEX) IV infusion 1.2 mcg/kg/hr (05/14/19 1500)  . fentaNYL 10 mcg/ml infusion 20 mcg/hr (05/14/19 1500)  . heparin 1,700 Units/hr (05/14/19 1500)  . midazolam 0.5 mg/hr (05/14/19 1500)  .  sulfamethoxazole-trimethoprim Stopped (05/14/19 0954)   PRN Meds:.sodium chloride, acetaminophen, fentaNYL, fentaNYL (SUBLIMAZE) injection, fentaNYL (SUBLIMAZE) injection, LORazepam, midazolam, ondansetron **OR** ondansetron (ZOFRAN) IV, sodium chloride, sodium chloride flush, sodium chloride flush   SUBJECTIVE/pvernight:  Sedated on vent/ off pressors  CONSTITUTIONAL: BP (!) 103/92   Pulse (!) 129   Temp 98.4 F (36.9 C) (Axillary)   Resp 19   Ht '6\' 8"'$  (2.032 m)   Wt 129.4 kg   SpO2 (!) 88%   BMI 31.34 kg/m   I/O last 3 completed shifts: In: 6255.8 [I.V.:1728.7; IV Piggyback:4527.1] Out: 5400 [Urine:5400]    Intake/Output Summary (Last 24 hours) at 05/14/2019 1540 Last data filed at 05/14/2019 1500 Gross per 24 hour  Intake 5411.51 ml  Output 4400 ml  Net 1011.51 ml           Vent Mode: PRVC FiO2 (%):  [40 %-50 %] 40 % Set Rate:  [26 bmp] 26 bmp Vt Set:  [650 mL] 650 mL PEEP:  [8 cmH20] 8 cmH20 Plateau Pressure:  [16 cmH20-27 cmH20] 16 cmH20  PHYSICAL EXAM: Sedated on vent but has periods of sitting up per nurses on fentanyl / precedex/ versed infustions Oropharynx oral et/ og Lungs distant insp rhonchi bilaterally  IRIR   Afib/flutter  on monitor Abd mod distended with decreased BS Extr  Warm with pitting edema R > L  Neuro  Moving all 4 ext when more awake/ sitting up and pulling at et also      RESOLVED PROBLEM LIST   ASSESSMENT AND PLAN    1) Acute on chronic respiratory failure now vent dp with diffuse as dz ? Etiology with PCT not suggestive of systemic bacterial infection but can't by itself r/o pna  - off ketruda since 03/02/2019 and on variable pred rx since then - on Infliximab 2/7  - off amiodarone 2/8 Lab Results  Component Value Date   ESRSEDRATE 30 (H) 05/11/2019   rec: continue high dose steroids for now Very unlikely FOB will yield specific dx that would change course here and risks making things worse than they already are so rec 1)  await BAL done  per RT for PCP, fungus,  2) ARDS protocol for Vt, fio2/peep  3) keep sedated with addition of clonazepam and d/c versed as risking tachyphylaxis at this point.  4) recheck esr / cxr in am 2/12      2) stage IV sq cell ca s airlway involvement  - holding further rx    3) BP still soft but maint off pressors  - check cvp/ repeat bnp in am   4) Thrombocytopenia  on heparin= thrombocytenia/ acute, not present on admit. Lab Results  Component Value Date   PLT 119 (L) 05/14/2019   PLT 104 (L) 05/13/2019   PLT 128 (L) 05/12/2019   PLT 207 03/30/2019   PLT 169 03/02/2019   PLT 185 02/10/2019   PLT 261 04/03/2017  PLT 171 03/27/2017   PLT 177 03/19/2017    Continue to monitor, note plt trending up   5) Mild hyperkalemia with mild hyponatremia despite good uop /on solumderol/ creat ? Etiology  rx per cards already   >>> change solumeddrol to Hydrocortisone   SUMMARY OF TODAY'S PLAN:  Add reglan for ileus, add clonazepam per OG with goal of off versed then maybe wean fentanyl next    Best Practice / Goals of Care / Disposition.   DVT PROPHYLAXIS: hep drip SUP: protonix IV  NUTRITION:NPO for now  MOBILITY:bed rest  GOALS OF CARE: Full code for now FAMILY DISCUSSIONS:  Updated wife at bedside. DISPOSITION ICU  LABS  Glucose Recent Labs  Lab 05/13/19 1154 05/13/19 1659 05/13/19 2013 05/14/19 0041 05/14/19 0804 05/14/19 1122  GLUCAP 170* 223* 169* 213* 236* 255*    BMET Recent Labs  Lab 05/12/19 0409 05/14/19 0317 05/14/19 0941  NA 127* 128* 125*  K 5.2* 6.2* 6.0*  CL 94* 103 97*  CO2 24 25 21*  BUN 39* 39* 40*  CREATININE 1.04 0.98 0.90  GLUCOSE 253* 192* 302*    Liver Enzymes Recent Labs  Lab 05/16/2019 1719 05/08/19 0402 05/12/19 0409  AST 31 38 20  ALT 38 40 48*  ALKPHOS 85 73 115  BILITOT 1.9* 2.9* 0.7  ALBUMIN 3.4* 3.2* 2.5*    Electrolytes Recent Labs  Lab 05/08/19 1159 05/09/19 0439 05/12/19 0409 05/14/19 0317  05/14/19 0941  CALCIUM  --    < > 8.1* 8.5* 8.3*  MG 2.0  --   --   --   --    < > = values in this interval not displayed.    CBC Recent Labs  Lab 05/12/19 0409 05/13/19 0420 05/14/19 0317  WBC 14.5* 9.8 9.2  HGB 13.4 13.3 14.1  HCT 40.9 40.9 43.8  PLT 128* 104* 119*    ABG Recent Labs  Lab 05/11/19 0906 05/11/19 1546  PHART 7.425 7.320*  PCO2ART 37.3 47.6  PO2ART 92.9 216*    Coag's No results for input(s): APTT, INR in the last 168 hours.  Sepsis Markers Recent Labs  Lab 05/08/19 1159 05/09/19 0439 05/10/19 0454  PROCALCITON 0.29 0.22 0.24    Cardiac Enzymes No results for input(s): TROPONINI, PROBNP in the last 168 hours.      The patient is critically ill with multiple organ systems failure and requires high complexity decision making for assessment and support, frequent evaluation and titration of therapies, application of advanced monitoring technologies and extensive interpretation of multiple databases. Critical Care Time devoted to patient care services described in this note is 35 minutes.   Christinia Gully, MD Pulmonary and [place] [phone] After 5:30 PM or weekends, use Beeper [phone]

## 2019-05-14 NOTE — Progress Notes (Signed)
Progress Note  Patient Name: Aaron Sellers Date of Encounter: 05/14/2019  Primary Cardiologist: Carlyle Dolly, MD  Subjective   No issues overnight.   Inpatient Medications    Scheduled Meds: . atorvastatin  10 mg Oral QPM  . chlorhexidine gluconate (MEDLINE KIT)  15 mL Mouth Rinse BID  . Chlorhexidine Gluconate Cloth  6 each Topical Daily  . digoxin  0.125 mg Intravenous Daily  . feeding supplement (PRO-STAT SUGAR FREE 64)  30 mL Oral TID  . gabapentin  300 mg Oral BID  . insulin aspart  0-5 Units Subcutaneous QHS  . insulin aspart  0-9 Units Subcutaneous TID WC  . mouth rinse  15 mL Mouth Rinse 10 times per day  . methylPREDNISolone (SOLU-MEDROL) injection  80 mg Intravenous Q8H  . metoprolol tartrate  2.5 mg Intravenous Q8H  . pantoprazole (PROTONIX) IV  40 mg Intravenous Q24H  . sodium chloride flush  10 mL Intravenous Q12H  . sodium chloride flush  3 mL Intravenous Q12H   Continuous Infusions: . sodium chloride    . azithromycin Stopped (05/13/19 1520)  . ceFEPime (MAXIPIME) IV Stopped (05/14/19 0314)  . dexmedetomidine (PRECEDEX) IV infusion 1.1 mcg/kg/hr (05/14/19 0825)  . feeding supplement (VITAL HIGH PROTEIN) 1,000 mL (05/14/19 0829)  . fentaNYL 10 mcg/ml infusion 20 mcg/hr (05/14/19 0600)  . heparin 2,000 Units/hr (05/14/19 0600)  . midazolam 0.5 mg/hr (05/14/19 0600)  . sulfamethoxazole-trimethoprim 400.8 mg (05/14/19 0824)   PRN Meds: sodium chloride, acetaminophen, fentaNYL, fentaNYL (SUBLIMAZE) injection, fentaNYL (SUBLIMAZE) injection, LORazepam, midazolam, ondansetron **OR** ondansetron (ZOFRAN) IV, sodium chloride, sodium chloride flush, sodium chloride flush   Vital Signs    Vitals:   05/14/19 0600 05/14/19 0700 05/14/19 0715 05/14/19 0802  BP: 123/81 115/76 116/68   Pulse: (!) 33 (!) 34 68   Resp: (!) 25 (!) 24 (!) 22   Temp:    98.4 F (36.9 C)  TempSrc:    Axillary  SpO2:   94%   Weight:      Height:        Intake/Output Summary  (Last 24 hours) at 05/14/2019 0835 Last data filed at 05/14/2019 0600 Gross per 24 hour  Intake 6255.83 ml  Output 4400 ml  Net 1855.83 ml   Last 3 Weights 05/14/2019 05/13/2019 05/12/2019  Weight (lbs) 285 lb 4.4 oz 281 lb 12 oz 280 lb 3.3 oz  Weight (kg) 129.4 kg 127.8 kg 127.1 kg      Telemetry    Aflutter rate controlled - Personally Reviewed  ECG    n/a - Personally Reviewed  Physical Exam   GEN: No acute distress.   Neck: No JVD Cardiac: irreg, no murmurs, rubs, or gallops.  Respiratory: Clear to auscultation bilaterally. GI: Soft, nontender, non-distended  MS: trace bilateral edema; No deformity. Neuro:  Nonfocal  Psych: Normal affect   Labs    High Sensitivity Troponin:  No results for input(s): TROPONINIHS in the last 720 hours.    Chemistry Recent Labs  Lab 05/16/2019 1719 05/27/2019 1719 05/08/19 0402 05/09/19 0439 05/10/19 0454 05/10/19 0454 05/11/19 0417 05/12/19 0409 05/14/19 0317  NA 134*   < > 134*   < > 133*   < > 129* 127* 128*  K 3.8   < > 4.8   < > 3.9   < > 4.1 5.2* 6.2*  CL 98   < > 96*   < > 95*   < > 94* 94* 103  CO2 24   < > 23   < >  24   < > _0 GLUCOSE 136*   < > 147*   < > 181*   < > 271* 253* 192*  BUN 17   < > 17   < > 25*   < > 31* 39* 39*  CREATININE 0.81   < > 1.01   < > 0.73   < > 0.95 1.04 0.98  CALCIUM 8.7*   < > 8.8*   < > 8.6*   < > 8.5* 8.1* 8.5*  PROT 7.1  --  7.3  --   --   --   --  6.0*  --   ALBUMIN 3.4*  --  3.2*  --   --   --   --  2.5*  --   AST 31  --  38  --   --   --   --  20  --   ALT 38  --  40  --   --   --   --  48*  --   ALKPHOS 85  --  73  --   --   --   --  115  --   BILITOT 1.9*  --  2.9*  --   --   --   --  0.7  --   GFRNONAA >60   < > >60   < > >60   < > >60 >60 >60  GFRAA >60   < > >60   < > >60   < > >60 >60 >60  ANIONGAP 12   < > 15   < > 14  --  13 9  --    < > = values in this interval not displayed.     Hematology Recent Labs  Lab 05/12/19 0409 05/13/19 0420 05/14/19 0317  WBC 14.5*  9.8 9.2  RBC 4.85 4.78 5.09  HGB 13.4 13.3 14.1  HCT 40.9 40.9 43.8  MCV 84.3 85.6 86.1  MCH 27.6 27.8 27.7  MCHC 32.8 32.5 32.2  RDW 13.2 13.2 13.3  PLT 128* 104* 119*    BNP Recent Labs  Lab 05/13/2019 1720  BNP 104.0*     DDimer  Recent Labs  Lab 05/08/19 1159  DDIMER 2.11*     Radiology    DG Abd 1 View  Result Date: 05/13/2019 CLINICAL DATA:  OG tube placement EXAM: ABDOMEN - 1 VIEW COMPARISON:  05/13/2019 FINDINGS: OG tube tip is in the stomach. Dilated small bowel loops concerning for small bowel obstruction. Gas also seen within nondistended colon. IMPRESSION: NG tube tip in the stomach. Concern for small bowel obstruction. Electronically Signed   By: Rolm Baptise M.D.   On: 05/13/2019 18:10   DG Abd 1 View  Result Date: 05/13/2019 CLINICAL DATA:  Orogastric tube placement. EXAM: ABDOMEN - 1 VIEW COMPARISON:  May 11, 2019. FINDINGS: The bowel gas pattern is normal. Distal tip of enteric tube is seen in proximal stomach. No radio-opaque calculi or other significant radiographic abnormality are seen. IMPRESSION: Distal tip of enteric tube seen in proximal stomach. Electronically Signed   By: Marijo Conception M.D.   On: 05/13/2019 11:31   ECHOCARDIOGRAM COMPLETE  Result Date: 05/13/2019    ECHOCARDIOGRAM REPORT   Patient Name:   Aaron Sellers Date of Exam: 05/13/2019 Medical Rec #:  [id number]    Height:       80.0 in Accession #:    [id number]   Weight:  281.7 lb Date of Birth:  05-Apr-1950    BSA:          2.66 m Patient Age:    69 years     BP:           107/71 mmHg Patient Gender: M            HR:           82 bpm. Exam Location:  [facility] Procedure: 2D Echo Indications:    Atrial Fibrillation 427.31 / I48.91  History:        Patient has prior history of Echocardiogram examinations, most                 recent 06/01/2017. COPD, Arrythmias:Atrial Fibrillation and Atrial                 Flutter; Risk Factors:Former Smoker, Dyslipidemia and                 Hypertension.  Lung mass,Primary malignant neoplasm of right lung                 metastatic to other site.  Sonographer:    Leavy Cella RDCS (AE) Referring Phys: [id number] [place]  1. Left ventricular ejection fraction, by estimation, is 45%. The left ventricle has mildly decreased function. The left ventrical has no regional wall motion abnormalities. There is mildly increased left ventricular hypertrophy. Left ventricular diastolic parameters are indeterminate.  2. Right ventricular systolic function is normal. The right ventricular size is normal. There is mildly elevated pulmonary artery systolic pressure.  3. The mitral valve is normal in structure and function. trivial mitral valve regurgitation. No evidence of mitral stenosis.  4. The aortic valve is tricuspid. Aortic valve regurgitation is not visualized. No aortic stenosis is present. FINDINGS  Left Ventricle: Left ventricular ejection fraction, by estimation, is 45%. The left ventricle has mildly decreased function. The left ventricle has no regional wall motion abnormalities. There is mildly increased left ventricular hypertrophy. Left ventricular diastolic parameters are indeterminate. Right Ventricle: The right ventricular size is normal. No increase in right ventricular wall thickness. Right ventricular systolic function is normal. There is mildly elevated pulmonary artery systolic pressure. The tricuspid regurgitant velocity is 2.52  m/s, and with an assumed right atrial pressure of 10 mmHg, the estimated right ventricular systolic pressure is 04.7 mmHg. Left Atrium: Left atrial size was normal in size. Right Atrium: Right atrial size was normal in size. Pericardium: There is no evidence of pericardial effusion. Mitral Valve: The mitral valve is normal in structure and function. Trivial mitral valve regurgitation. No evidence of mitral valve stenosis. Tricuspid Valve: The tricuspid valve is normal in structure. Tricuspid valve regurgitation  is mild . No evidence of tricuspid stenosis. Aortic Valve: The aortic valve is tricuspid. Aortic valve regurgitation is not visualized. No aortic stenosis is present. Aortic valve mean gradient measures 4.9 mmHg. Aortic valve peak gradient measures 8.7 mmHg. Aortic valve area, by VTI measures 1.74 cm. Pulmonic Valve: The pulmonic valve was not well visualized. Pulmonic valve regurgitation is not visualized. No evidence of pulmonic stenosis. Aorta: The aortic root is normal in size and structure. Pulmonary Artery: Indeterminate PASP, inadequate TR jet. Venous: The inferior vena cava was not well visualized. IAS/Shunts: No atrial level shunt detected by color flow Doppler.  LEFT VENTRICLE PLAX 2D LVIDd:         4.86 cm  Diastology LVIDs:  4.01 cm  LV e' lateral:   9.03 cm/s LV PW:         1.34 cm  LV E/e' lateral: 9.9 LV IVS:        1.15 cm  LV e' medial:    6.96 cm/s LVOT diam:     2.10 cm  LV E/e' medial:  12.9 LV SV:         47.58 ml LV SV Index:   14.87 LVOT Area:     3.46 cm  RIGHT VENTRICLE RV S prime:     11.10 cm/s TAPSE (M-mode): 1.8 cm LEFT ATRIUM             Index       RIGHT ATRIUM           Index LA diam:        2.90 cm 1.09 cm/m  RA Area:     19.00 cm LA Vol (A2C):   68.4 ml 25.71 ml/m RA Volume:   51.10 ml  19.21 ml/m LA Vol (A4C):   53.2 ml 20.00 ml/m LA Biplane Vol: 65.1 ml 24.47 ml/m  AORTIC VALVE AV Area (Vmax):    1.82 cm AV Area (Vmean):   1.65 cm AV Area (VTI):     1.74 cm AV Vmax:           147.80 cm/s AV Vmean:          106.122 cm/s AV VTI:            0.273 m AV Peak Grad:      8.7 mmHg AV Mean Grad:      4.9 mmHg LVOT Vmax:         77.61 cm/s LVOT Vmean:        50.669 cm/s LVOT VTI:          0.137 m LVOT/AV VTI ratio: 0.50  AORTA Ao Root diam: 3.40 cm MITRAL VALVE                        TRICUSPID VALVE MV Area (PHT): 4.80 cm             TR Peak grad:   25.4 mmHg MV Decel Time: 158 msec             TR Vmax:        252.00 cm/s MV E velocity: 89.70 cm/s 103 cm/s MV A velocity:  30.60 cm/s 70.3 cm/s SHUNTS MV E/A ratio:  2.93       1.5       Systemic VTI:  0.14 m                                     Systemic Diam: 2.10 cm Carlyle Dolly MD Electronically signed by Carlyle Dolly MD Signature Date/Time: 05/13/2019/3:45:36 PM    Final     Cardiac Studies    Patient Profile     69 y.o.malewith a history of hyperlipidemia, stage IV lung cancer, COPD, atrial fibrillation (previously not anticoagulated with CHA2DS2-VASc or of 1, lung cancer, and history of bowel perforation).  Assessment & Plan    1. Afib/aflutter with RVR - had been on IV dilt. IV digoxin started due to ongoing tachycardia with soft bp's, intermittently on pressors. Amio stopped due to concerns about lung status. - CHA2DS2-VASc of 1, lung cancer, and history of bowel perforation) had not been on  home anticoag  dilt drip weaned off. CUrrently on lopressor 2.27m every 8 hours, IV digoxin 0.125 mg IV daily.  - hep gtt in case cardioversion is required, so far has been able to be rate controled.   - may consider TEE/DCCV at some point if remains in flutter. With todays electrolyte abnormalites specifically hyperkalemia would not pursue today. He is well rate controlled on current regimen so no urgency.    2. Acute hypoxic resp failure - diffuse pulm infiltrates on presentation - had been on bipap, later intubated - on emperic broad spectrum abx - COVID 2 negative. 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 Hypotension - intermittently on pressors - echo with LVE 45%, normal RV function. No cardiac etiology of his hypotension  5. Mild systolic dysfunction - LVE 45% by echo, mild decrease from 05/2017 study. THis study done in setting of aflutter,  repeat study once back in and maintaing SR.  6. Hyperkalemia - repeat stat lab - has been on bactrim - got kayexelate this AM   For questions or updates, please contact COnawayPlease consult [email or web address] for contact info under        Signed, BCarlyle Dolly MD  05/14/2019, 8:35 AM

## 2019-05-14 NOTE — Progress Notes (Signed)
Inpatient Diabetes Program Recommendations  AACE/ADA: New Consensus Statement on Inpatient Glycemic Control (2015)  Target Ranges:  Prepandial:   less than 140 mg/dL      Peak postprandial:   less than 180 mg/dL (1-2 hours)      Critically ill patients:  140 - 180 mg/dL   Lab Results  Component Value Date   GLUCAP 255 (H) 05/14/2019   HGBA1C 7.0 (H) 05/08/2019    Review of Glycemic Control Results for Aaron Sellers, Aaron Sellers (MRN [id number]) as of 05/14/2019 12:53  Ref. Range 05/13/2019 16:59 05/13/2019 20:13 05/14/2019 00:41 05/14/2019 08:04 05/14/2019 11:22  Glucose-Capillary Latest Ref Range: 70 - 99 mg/dL 223 (H) 169 (H) 213 (H) 236 (H) 255 (H)   Diabetes history: None mentioned Outpatient Diabetes medications: None Current orders for Inpatient glycemic control: Novolog 0-9 TID with meals + 0-5 QHS + solumedrol 80 mg Q8H  Inpatient Diabetes Program Recommendations:    -Please consider Adult ICU glycemic control protocol -Or Add Novolog 2 units q 4 hrs. Tube feed coverage (hold if tube feed held or stopped for any reason)  Thank you, Bethena Roys E. Elisia Stepp, RN, MSN, CDE  Diabetes Coordinator Inpatient Glycemic Control Team Team Pager [phone] (8am-5pm) 05/14/2019 12:55 PM

## 2019-05-14 NOTE — Consult Note (Signed)
[facility] Oncology Progress Note  Name: Javanni Maring      MRN: [id number]    Location: IC07/IC07-01  Date: 05/14/2019 Time:7:48 PM   Subjective: Interval History:Miranda Holberg was seen today at bedside.  He remains intubated on 40% FiO2.  Significant other at bedside.  Tube feeds were discontinued secondary to SBO/ileus.  Objective: Vital signs in last 24 hours: Temp:  [97.6 F (36.4 C)-98.6 F (37 C)] 98.6 F (37 C) (02/11 1600) Pulse Rate:  [33-129] 62 (02/11 1700) Resp:  [17-27] 18 (02/11 1700) BP: (90-123)/(65-93) 110/78 (02/11 1700) SpO2:  [88 %-95 %] 91 % (02/11 1600) FiO2 (%):  [40 %-50 %] 40 % (02/11 1551) Weight:  [285 lb 4.4 oz (129.4 kg)] 285 lb 4.4 oz (129.4 kg) (02/11 0500)    Intake/Output from previous day: 02/10 0800 - 02/11 0759 In: 6255.8 [I.V.:1728.7] Out: 4400 [Urine:4400]    Intake/Output this shift: Total I/O In: 2965.6 [I.V.:828.1; NG/GT:1062.8; IV Piggyback:1074.8] Out: -    PHYSICAL EXAM: BP 110/78   Pulse 62   Temp 98.6 F (37 C) (Oral)   Resp 18   Ht 6\' 8"  (2.032 m)   Wt 285 lb 4.4 oz (129.4 kg)   SpO2 91%   BMI 31.34 kg/m  General appearance: Sedated. Lungs: Bilateral air entry present. Heart: irregularly irregular rhythm Neurologic: Sedated.   Studies/Results: Results for orders placed or performed during the hospital encounter of 05/08/2019 (from the past 48 hour(s))  Glucose, capillary     Status: Abnormal   Collection Time: 05/12/19  8:02 PM  Result Value Ref Range   Glucose-Capillary 185 (H) 70 - 99 mg/dL   Comment 1 Notify RN    Comment 2 Document in Chart   Glucose, capillary     Status: Abnormal   Collection Time: 05/13/19 12:01 AM  Result Value Ref Range   Glucose-Capillary 196 (H) 70 - 99 mg/dL   Comment 1 Notify RN    Comment 2 Document in Chart   Glucose, capillary     Status: Abnormal   Collection Time: 05/13/19  3:49 AM  Result Value Ref Range   Glucose-Capillary 207 (H) 70 - 99 mg/dL   Comment 1  Notify RN    Comment 2 Document in Chart   C-reactive protein     Status: Abnormal   Collection Time: 05/13/19  4:20 AM  Result Value Ref Range   CRP 6.6 (H) <1.0 mg/dL    Comment: Performed at [facility], [address] [place], [place] [address]  CBC     Status: Abnormal   Collection Time: 05/13/19  4:20 AM  Result Value Ref Range   WBC 9.8 4.0 - 10.5 K/uL   RBC 4.78 4.22 - 5.81 MIL/uL   Hemoglobin 13.3 13.0 - 17.0 g/dL   HCT 40.9 39.0 - 52.0 %   MCV 85.6 80.0 - 100.0 fL   MCH 27.8 26.0 - 34.0 pg   MCHC 32.5 30.0 - 36.0 g/dL   RDW 13.2 11.5 - 15.5 %   Platelets 104 (L) 150 - 400 K/uL    Comment: PLATELET COUNT CONFIRMED BY SMEAR SPECIMEN CHECKED FOR CLOTS Immature Platelet Fraction may be clinically indicated, consider ordering this additional test [id number]    nRBC 0.0 0.0 - 0.2 %    Comment: Performed at [facility], [address] [place], [place] [address]  Heparin level (unfractionated)     Status: Abnormal   Collection Time: 05/13/19  4:20 AM  Result Value Ref Range  Heparin Unfractionated 0.72 (H) 0.30 - 0.70 IU/mL    Comment: (NOTE) If heparin results are below expected values, and patient dosage has  been confirmed, suggest follow up testing of antithrombin III levels. Performed at [facility], [address], [place], [place] [address]   Glucose, capillary     Status: Abnormal   Collection Time: 05/13/19  7:49 AM  Result Value Ref Range   Glucose-Capillary 206 (H) 70 - 99 mg/dL  Glucose, capillary     Status: Abnormal   Collection Time: 05/13/19 11:54 AM  Result Value Ref Range   Glucose-Capillary 170 (H) 70 - 99 mg/dL  Heparin level (unfractionated)     Status: None   Collection Time: 05/13/19  4:11 PM  Result Value Ref Range   Heparin Unfractionated 0.55 0.30 - 0.70 IU/mL    Comment: (NOTE) If heparin results are below expected values, and patient dosage has  been confirmed, suggest follow up testing of antithrombin III levels. Performed at [facility], [address], [place], [place] [address]   Glucose, capillary     Status: Abnormal   Collection Time: 05/13/19  4:59 PM  Result Value Ref Range   Glucose-Capillary 223 (H) 70 - 99 mg/dL  Glucose, capillary     Status: Abnormal   Collection Time: 05/13/19  8:13 PM  Result Value Ref Range   Glucose-Capillary 169 (H) 70 - 99 mg/dL  Glucose, capillary     Status: Abnormal   Collection Time: 05/14/19 12:41 AM  Result Value Ref Range   Glucose-Capillary 213 (H) 70 - 99 mg/dL  CBC     Status: Abnormal   Collection Time: 05/14/19  3:17 AM  Result Value Ref Range   WBC 9.2 4.0 - 10.5 K/uL   RBC 5.09 4.22 - 5.81 MIL/uL   Hemoglobin 14.1 13.0 - 17.0 g/dL   HCT 43.8 39.0 - 52.0 %   MCV 86.1 80.0 - 100.0 fL   MCH 27.7 26.0 - 34.0 pg   MCHC 32.2 30.0 - 36.0 g/dL   RDW 13.3 11.5 - 15.5 %   Platelets 119 (L) 150 - 400 K/uL    Comment: CONSISTENT WITH PREVIOUS RESULT   nRBC 0.0 0.0 - 0.2 %    Comment: Performed at [facility], [address], [place], [place] [address]  Heparin level (unfractionated)     Status: Abnormal   Collection Time: 05/14/19  3:17 AM  Result Value Ref Range   Heparin Unfractionated 1.02 (H) 0.30 - 0.70 IU/mL    Comment: (NOTE) If heparin results are below expected values, and patient dosage has  been confirmed, suggest follow up testing of antithrombin III levels. Performed at [facility], [address], [place], [place] [address]   Basic metabolic panel     Status: Abnormal   Collection Time: 05/14/19  3:17 AM  Result Value Ref Range   Sodium 128 (L) 135 - 145 mmol/L   Potassium 6.2 (H) 3.5 - 5.1 mmol/L   Chloride 103 98 - 111 mmol/L   CO2 25 22 - 32 mmol/L   Glucose, Bld 192 (H) 70 - 99 mg/dL   BUN 39 (H) 8 - 23 mg/dL   Creatinine, Ser 0.98 0.61 - 1.24 mg/dL   Calcium 8.5 (L) 8.9 - 10.3 mg/dL   GFR calc non Af Amer >60 >60 mL/min   GFR calc Af Amer >60 >60 mL/min    Comment: Performed at [facility], [address] [place], [place] [address]   Glucose,  capillary     Status: Abnormal   Collection Time: 05/14/19  8:04 AM  Result Value Ref Range   Glucose-Capillary 236 (H) 70 - 99 mg/dL  Basic metabolic panel     Status: Abnormal   Collection Time: 05/14/19  9:41 AM  Result Value Ref Range   Sodium 125 (L) 135 - 145 mmol/L   Potassium 6.0 (H) 3.5 - 5.1 mmol/L   Chloride 97 (L) 98 - 111 mmol/L   CO2 21 (L) 22 - 32 mmol/L   Glucose, Bld 302 (H) 70 - 99 mg/dL   BUN 40 (H) 8 - 23 mg/dL   Creatinine, Ser 0.90 0.61 - 1.24 mg/dL   Calcium 8.3 (L) 8.9 - 10.3 mg/dL   GFR calc non Af Amer >60 >60 mL/min   GFR calc Af Amer >60 >60 mL/min   Anion gap 7 5 - 15    Comment: Performed at [facility], [address] [place], [place] [address]  Glucose, capillary     Status: Abnormal   Collection Time: 05/14/19 11:22 AM  Result Value Ref Range   Glucose-Capillary 255 (H) 70 - 99 mg/dL  Heparin level (unfractionated)     Status: None   Collection Time: 05/14/19  3:22 PM  Result Value Ref Range   Heparin Unfractionated 0.44 0.30 - 0.70 IU/mL    Comment: (NOTE) If heparin results are below expected values, and patient dosage has  been confirmed, suggest follow up testing of antithrombin III levels. Performed at [facility], [address] [place], [place] [address]   Magnesium     Status: None   Collection Time: 05/14/19  3:22 PM  Result Value Ref Range   Magnesium 1.9 1.7 - 2.4 mg/dL    Comment: Performed at [facility], [address] [place], [place] [address]  Glucose, capillary     Status: Abnormal   Collection Time: 05/14/19  4:19 PM  Result Value Ref Range   Glucose-Capillary 214 (H) 70 - 99 mg/dL  Glucose, capillary     Status: Abnormal   Collection Time: 05/14/19  7:43 PM  Result Value Ref Range   Glucose-Capillary 189 (H) 70 - 99 mg/dL   DG Abd 1 View  Result Date: 05/14/2019 CLINICAL DATA:  Abdominal distension. EXAM: ABDOMEN - 1 VIEW COMPARISON:  May 13, 2019. FINDINGS: Mildly dilated small bowel loops are  noted concerning for ileus or possibly distal small bowel obstruction. Nasogastric tube tip is seen in proximal stomach. No colonic dilatation is noted. No radio-opaque calculi or other significant radiographic abnormality are seen. IMPRESSION: Nasogastric tube tip seen in proximal stomach. Mildly dilated small bowel loops are noted concerning for ileus or possibly distal small bowel obstruction. Electronically Signed   By: Marijo Conception M.D.   On: 05/14/2019 14:45   DG Abd 1 View  Result Date: 05/13/2019 CLINICAL DATA:  OG tube placement EXAM: ABDOMEN - 1 VIEW COMPARISON:  05/13/2019 FINDINGS: OG tube tip is in the stomach. Dilated small bowel loops concerning for small bowel obstruction. Gas also seen within nondistended colon. IMPRESSION: NG tube tip in the stomach. Concern for small bowel obstruction. Electronically Signed   By: Rolm Baptise M.D.   On: 05/13/2019 18:10   DG Abd 1 View  Result Date: 05/13/2019 CLINICAL DATA:  Orogastric tube placement. EXAM: ABDOMEN - 1 VIEW COMPARISON:  May 11, 2019. FINDINGS: The bowel gas pattern is normal. Distal tip of enteric tube is seen in proximal stomach. No radio-opaque calculi or other  significant radiographic abnormality are seen. IMPRESSION: Distal tip of enteric tube seen in proximal stomach. Electronically Signed   By: Marijo Conception M.D.   On: 05/13/2019 11:31   ECHOCARDIOGRAM COMPLETE  Result Date: 05/13/2019    ECHOCARDIOGRAM REPORT   Patient Name:   FLYNN GWYN Date of Exam: 05/13/2019 Medical Rec #:  [id number]    Height:       80.0 in Accession #:    [id number]   Weight:       281.7 lb Date of Birth:  [birth year]    BSA:          2.66 m Patient Age:    53 years     BP:           107/71 mmHg Patient Gender: M            HR:           82 bpm. Exam Location:  [facility] Procedure: 2D Echo Indications:    Atrial Fibrillation 427.31 / I48.91  History:        Patient has prior history of Echocardiogram examinations, most                 recent  06/01/2017. COPD, Arrythmias:Atrial Fibrillation and Atrial                 Flutter; Risk Factors:Former Smoker, Dyslipidemia and                 Hypertension. Lung mass,Primary malignant neoplasm of right lung                 metastatic to other site.  Sonographer:    Leavy Cella RDCS (AE) Referring Phys: [id number] [place]  1. Left ventricular ejection fraction, by estimation, is 45%. The left ventricle has mildly decreased function. The left ventrical has no regional wall motion abnormalities. There is mildly increased left ventricular hypertrophy. Left ventricular diastolic parameters are indeterminate.  2. Right ventricular systolic function is normal. The right ventricular size is normal. There is mildly elevated pulmonary artery systolic pressure.  3. The mitral valve is normal in structure and function. trivial mitral valve regurgitation. No evidence of mitral stenosis.  4. The aortic valve is tricuspid. Aortic valve regurgitation is not visualized. No aortic stenosis is present. FINDINGS  Left Ventricle: Left ventricular ejection fraction, by estimation, is 45%. The left ventricle has mildly decreased function. The left ventricle has no regional wall motion abnormalities. There is mildly increased left ventricular hypertrophy. Left ventricular diastolic parameters are indeterminate. Right Ventricle: The right ventricular size is normal. No increase in right ventricular wall thickness. Right ventricular systolic function is normal. There is mildly elevated pulmonary artery systolic pressure. The tricuspid regurgitant velocity is 2.52  m/s, and with an assumed right atrial pressure of 10 mmHg, the estimated right ventricular systolic pressure is 32.2 mmHg. Left Atrium: Left atrial size was normal in size. Right Atrium: Right atrial size was normal in size. Pericardium: There is no evidence of pericardial effusion. Mitral Valve: The mitral valve is normal in structure and function. Trivial  mitral valve regurgitation. No evidence of mitral valve stenosis. Tricuspid Valve: The tricuspid valve is normal in structure. Tricuspid valve regurgitation is mild . No evidence of tricuspid stenosis. Aortic Valve: The aortic valve is tricuspid. Aortic valve regurgitation is not visualized. No aortic stenosis is present. Aortic valve mean gradient measures 4.9 mmHg. Aortic valve peak gradient measures 8.7 mmHg. Aortic valve area, by  VTI measures 1.74 cm. Pulmonic Valve: The pulmonic valve was not well visualized. Pulmonic valve regurgitation is not visualized. No evidence of pulmonic stenosis. Aorta: The aortic root is normal in size and structure. Pulmonary Artery: Indeterminate PASP, inadequate TR jet. Venous: The inferior vena cava was not well visualized. IAS/Shunts: No atrial level shunt detected by color flow Doppler.  LEFT VENTRICLE PLAX 2D LVIDd:         4.86 cm  Diastology LVIDs:         4.01 cm  LV e' lateral:   9.03 cm/s LV PW:         1.34 cm  LV E/e' lateral: 9.9 LV IVS:        1.15 cm  LV e' medial:    6.96 cm/s LVOT diam:     2.10 cm  LV E/e' medial:  12.9 LV SV:         47.58 ml LV SV Index:   14.87 LVOT Area:     3.46 cm  RIGHT VENTRICLE RV S prime:     11.10 cm/s TAPSE (M-mode): 1.8 cm LEFT ATRIUM             Index       RIGHT ATRIUM           Index LA diam:        2.90 cm 1.09 cm/m  RA Area:     19.00 cm LA Vol (A2C):   68.4 ml 25.71 ml/m RA Volume:   51.10 ml  19.21 ml/m LA Vol (A4C):   53.2 ml 20.00 ml/m LA Biplane Vol: 65.1 ml 24.47 ml/m  AORTIC VALVE AV Area (Vmax):    1.82 cm AV Area (Vmean):   1.65 cm AV Area (VTI):     1.74 cm AV Vmax:           147.80 cm/s AV Vmean:          106.122 cm/s AV VTI:            0.273 m AV Peak Grad:      8.7 mmHg AV Mean Grad:      4.9 mmHg LVOT Vmax:         77.61 cm/s LVOT Vmean:        50.669 cm/s LVOT VTI:          0.137 m LVOT/AV VTI ratio: 0.50  AORTA Ao Root diam: 3.40 cm MITRAL VALVE                        TRICUSPID VALVE MV Area (PHT): 4.80  cm             TR Peak grad:   25.4 mmHg MV Decel Time: 158 msec             TR Vmax:        252.00 cm/s MV E velocity: 89.70 cm/s 103 cm/s MV A velocity: 30.60 cm/s 70.3 cm/s SHUNTS MV E/A ratio:  2.93       1.5       Systemic VTI:  0.14 m                                     Systemic Diam: 2.10 cm Carlyle Dolly MD Electronically signed by Carlyle Dolly MD Signature Date/Time: 05/13/2019/3:45:36 PM    Final      MEDICATIONS: I have reviewed  the patient's current medications.     Assessment/Plan:  1.  Acute respiratory failure: -High suspicion for Keytruda pneumonitis.  He is on IV steroids. -He is also on antibiotics including azithromycin and cefepime.  He is also receiving Bactrim. -Thought to be too unstable for bronchoscopy. -Fungitell assay was elevated at 188.  I would recommend adding antifungal coverage for invasive infections.  I would go ahead and add voriconazole 6 mg/kg every 12 hours loading dose today.  2.  Thrombocytopenia: -This has plateaued.  Platelet count today is 118.  3.  A. fib with RVR: -He is on heparin and diltiazem. -2D echo shows EF 45%.  4.  Abdominal distention: -There is concern for SBO/ileus.  Tube feeds were discontinued.  All questions were answered. The patient knows to call the clinic with any problems, questions or concerns. We can certainly see the patient much sooner if necessary.     Derek Jack

## 2019-05-14 NOTE — Progress Notes (Signed)
ANTICOAGULATION CONSULT NOTE -   Pharmacy Consult for Heparin Indication: atrial fibrillation  Allergies  Allergen Reactions  . Ciprofloxacin     HIVES  . Adhesive [Tape] Other (See Comments)    Skin peels  . Codeine Other (See Comments)    Jitters, "skin crawling"    Patient Measurements: Height: 6\' 8"  (203.2 cm) Weight: 285 lb 4.4 oz (129.4 kg) IBW/kg (Calculated) : 96 HEPARIN DW (KG): 121  Vital Signs: Temp: 98 F (36.7 C) (02/11 0400) Temp Source: Axillary (02/11 0400) BP: 116/68 (02/11 0715) Pulse Rate: 68 (02/11 0715)  Labs: Recent Labs    05/12/19 0409 05/12/19 1326 05/13/19 0420 05/13/19 1611 05/14/19 0317  HGB 13.4  --  13.3  --  14.1  HCT 40.9  --  40.9  --  43.8  PLT 128*  --  104*  --  119*  HEPARINUNFRC 0.71*   < > 0.72* 0.55 1.02*  CREATININE 1.04  --   --   --  0.98   < > = values in this interval not displayed.    Estimated Creatinine Clearance: 114.7 mL/min (by C-G formula based on SCr of 0.98 mg/dL).   Medical History: Past Medical History:  Diagnosis Date  . Arthritis   . Atrial flutter ([place])   . BPH (benign prostatic hypertrophy) with urinary retention   . Cancer ([place])   . Chronic kidney disease    hx of kidney stones  2011  . COPD (chronic obstructive pulmonary disease) ([place])    has been smoking since he was 22 ish--  . High cholesterol   . Hypertension    dx around 2011  . Squamous cell carcinoma of right lung (HCC)     Medications:  Medications Prior to Admission  Medication Sig Dispense Refill Last Dose  . acetaminophen (TYLENOL) 500 MG tablet Take 1,000 mg by mouth every 6 (six) hours as needed for moderate pain.   unknown at Unknown time  . atorvastatin (LIPITOR) 10 MG tablet Take 10 mg by mouth every evening.    05/06/2019 at Unknown time  . CARTIA XT 180 MG 24 hr capsule Take 1 capsule by mouth once daily (Patient taking differently: Take 180 mg by mouth daily. ) 90 capsule 0 05/31/2019 at Unknown time  . Cyanocobalamin  (VITAMIN B-12 PO) Take 1 tablet by mouth daily.   05/24/2019 at Unknown time  . famotidine (PEPCID) 10 MG tablet Take 10 mg by mouth as needed for heartburn or indigestion.   05/06/2019 at Unknown time  . furosemide (LASIX) 40 MG tablet Take 1 tablet (40 mg total) by mouth daily as needed. 90 tablet 3 Past Week at Unknown time  . gabapentin (NEURONTIN) 300 MG capsule TAKE 1 CAPSULE BY MOUTH THREE TIMES DAILY (Patient taking differently: Take 300 mg by mouth 2 (two) times daily. ) 90 capsule 0 05/13/2019 at Unknown time  . loratadine (CLARITIN) 10 MG tablet Take 10 mg by mouth daily as needed for allergies.    unknown  . tamsulosin (FLOMAX) 0.4 MG CAPS capsule Take 0.4 mg by mouth daily.   05/28/2019 at Unknown time  . terbinafine ([employer] ATHLETES FOOT) 1 % cream Apply 1 application topically daily.   Past Week at Unknown time  . predniSONE (DELTASONE) 20 MG tablet Take 6 tablets daily for 2 weeks, then take 5 tablets daily for 1 week, then take 4 tablets for 1 week, then take 3 tablets for 1 week, then take 2 tablet for 1 week, then take  1 tablet for 1 week, then take 0.5 tablets for 1 week. 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 D-dimers were elevated, CTA chest with no evidence of PE, but significant for severe interstitial lung disease. Pharmacy asked to anticoagulate with heparin for afib.  HL supratherapeutic at 1.02  Goal of Therapy:  Heparin level 0.3-0.7 units/ml Monitor platelets by anticoagulation protocol: Yes   Plan:  Hold heparin infusion for 1 hour Decrease heparin infusion to 1700 units/hr. Check anti-Xa level in 6 hours and daily  while on heparin Continue to monitor H&H and platelets  Margot Ables, PharmD Clinical Pharmacist 05/14/2019 8:14 AM

## 2019-05-14 NOTE — Progress Notes (Signed)
**Note De-Identified  Obfuscation** EKG complete and placed in patient chart; RN notified

## 2019-05-14 NOTE — Progress Notes (Signed)
ANTICOAGULATION CONSULT NOTE -   Pharmacy Consult for Heparin Indication: atrial fibrillation  Allergies  Allergen Reactions  . Ciprofloxacin     HIVES  . Adhesive [Tape] Other (See Comments)    Skin peels  . Codeine Other (See Comments)    Jitters, "skin crawling"    Patient Measurements: Height: 6\' 8"  (203.2 cm) Weight: 285 lb 4.4 oz (129.4 kg) IBW/kg (Calculated) : 96 HEPARIN DW (KG): 121  Vital Signs: Temp: 98.4 F (36.9 C) (02/11 0802) Temp Source: Axillary (02/11 0802) BP: 103/92 (02/11 1515) Pulse Rate: 129 (02/11 1515)  Labs: Recent Labs    05/12/19 0409 05/12/19 1326 05/13/19 0420 05/13/19 0420 05/13/19 1611 05/14/19 0317 05/14/19 0941 05/14/19 1522  HGB 13.4  --  13.3  --   --  14.1  --   --   HCT 40.9  --  40.9  --   --  43.8  --   --   PLT 128*  --  104*  --   --  119*  --   --   HEPARINUNFRC 0.71*   < > 0.72*   < > 0.55 1.02*  --  0.44  CREATININE 1.04  --   --   --   --  0.98 0.90  --    < > = values in this interval not displayed.    Estimated Creatinine Clearance: 124.9 mL/min (by C-G formula based on SCr of 0.9 mg/dL).   Medical History: Past Medical History:  Diagnosis Date  . Arthritis   . Atrial flutter ([place])   . BPH (benign prostatic hypertrophy) with urinary retention   . Cancer ([place])   . Chronic kidney disease    hx of kidney stones  2011  . COPD (chronic obstructive pulmonary disease) ([place])    has been smoking since he was 22 ish--  . High cholesterol   . Hypertension    dx around 2011  . Squamous cell carcinoma of right lung (HCC)     Medications:  Medications Prior to Admission  Medication Sig Dispense Refill Last Dose  . acetaminophen (TYLENOL) 500 MG tablet Take 1,000 mg by mouth every 6 (six) hours as needed for moderate pain.   unknown at Unknown time  . atorvastatin (LIPITOR) 10 MG tablet Take 10 mg by mouth every evening.    05/06/2019 at Unknown time  . CARTIA XT 180 MG 24 hr capsule Take 1 capsule by mouth once  daily (Patient taking differently: Take 180 mg by mouth daily. ) 90 capsule 0 05/21/2019 at Unknown time  . Cyanocobalamin (VITAMIN B-12 PO) Take 1 tablet by mouth daily.   05/11/2019 at Unknown time  . famotidine (PEPCID) 10 MG tablet Take 10 mg by mouth as needed for heartburn or indigestion.   05/06/2019 at Unknown time  . furosemide (LASIX) 40 MG tablet Take 1 tablet (40 mg total) by mouth daily as needed. 90 tablet 3 Past Week at Unknown time  . gabapentin (NEURONTIN) 300 MG capsule TAKE 1 CAPSULE BY MOUTH THREE TIMES DAILY (Patient taking differently: Take 300 mg by mouth 2 (two) times daily. ) 90 capsule 0 05/29/2019 at Unknown time  . loratadine (CLARITIN) 10 MG tablet Take 10 mg by mouth daily as needed for allergies.    unknown  . tamsulosin (FLOMAX) 0.4 MG CAPS capsule Take 0.4 mg by mouth daily.   05/09/2019 at Unknown time  . terbinafine ([employer] ATHLETES FOOT) 1 % cream Apply 1 application topically daily.   Past Week  at Unknown time  . predniSONE (DELTASONE) 20 MG tablet Take 6 tablets daily for 2 weeks, then take 5 tablets daily for 1 week, then take 4 tablets for 1 week, then take 3 tablets for 1 week, then take 2 tablet for 1 week, then take 1 tablet for 1 week, then take 0.5 tablets for 1 week. 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 D-dimers were elevated, CTA chest with no evidence of PE, but significant for severe interstitial lung disease. Pharmacy asked to anticoagulate with heparin for afib.  HL therapeutic at 0.44  Goal of Therapy:  Heparin level 0.3-0.7 units/ml Monitor platelets by anticoagulation protocol: Yes   Plan:   Continue heparin infusion @ 1700 units/hr. Check anti-Xa level in 6 hours and daily while on heparin Continue to monitor H&H and platelets  Thomasenia Sales, PharmD, MBA, BCGP Clinical Pharmacist  05/14/2019 4:55 PM

## 2019-05-14 NOTE — Progress Notes (Signed)
Repeat K 6. EKG with aflutter, no acute findings of hyperkalemia. Will give calcium gluconate. Repeat kayexelate 30g x 1.     Zandra Abts MD

## 2019-05-14 NOTE — Progress Notes (Signed)
Daily Progress Note   Patient Name: Aaron Sellers       Date: 05/14/2019 DOB: Feb 07, 1951  Age: 69 y.o. MRN#: [id number] Attending Physician: Rodena Goldmann, DO Primary Care Physician: Janece Canterbury Admit Date: 05/14/2019  Reason for Consultation/Follow-up: Establishing goals of care  Subjective: Patient remains intubated- per RN with increasing O2 needs, bleeding. Spoke with Holley Raring- she is in communication with patient's children. She is aware that patient is doing poorly. Plan made for children to visit tomorrow for full family discussion of continued aggressive measures vs transition to comfort.   Review of Systems  Unable to perform ROS: Intubated    Length of Stay: 7  Current Medications: Scheduled Meds:  . atorvastatin  10 mg Oral QPM  . chlorhexidine gluconate (MEDLINE KIT)  15 mL Mouth Rinse BID  . Chlorhexidine Gluconate Cloth  6 each Topical Daily  . digoxin  0.125 mg Intravenous Daily  . feeding supplement (PRO-STAT SUGAR FREE 64)  30 mL Oral TID  . gabapentin  300 mg Oral BID  . insulin aspart  0-5 Units Subcutaneous QHS  . insulin aspart  0-9 Units Subcutaneous TID WC  . mouth rinse  15 mL Mouth Rinse 10 times per day  . methylPREDNISolone (SOLU-MEDROL) injection  80 mg Intravenous Q8H  . metoprolol tartrate  2.5 mg Intravenous Q8H  . pantoprazole (PROTONIX) IV  40 mg Intravenous Q24H  . sodium chloride flush  10 mL Intravenous Q12H  . sodium chloride flush  3 mL Intravenous Q12H    Continuous Infusions: . sodium chloride    . sodium chloride 75 mL/hr at 05/14/19 1045  . azithromycin Stopped (05/13/19 1520)  . ceFEPime (MAXIPIME) IV 2 g (05/14/19 1046)  . dexmedetomidine (PRECEDEX) IV infusion 1.2 mcg/kg/hr (05/14/19 1047)  . feeding supplement (VITAL HIGH  PROTEIN) 1,000 mL (05/14/19 0829)  . fentaNYL 10 mcg/ml infusion 20 mcg/hr (05/14/19 0930)  . heparin 1,700 Units/hr (05/14/19 0930)  . midazolam 0.5 mg/hr (05/14/19 0930)  . sulfamethoxazole-trimethoprim 350 mL/hr at 05/14/19 0930    PRN Meds: sodium chloride, acetaminophen, fentaNYL, fentaNYL (SUBLIMAZE) injection, fentaNYL (SUBLIMAZE) injection, LORazepam, midazolam, ondansetron **OR** ondansetron (ZOFRAN) IV, sodium chloride, sodium chloride flush, sodium chloride flush  Physical Exam Vitals and nursing note reviewed.  Constitutional:      Appearance: He is ill-appearing.  HENT:     Nose:     Comments:  +epistaxis- packing present Skin:    Coloration: Skin is pale.  Neurological:     Comments: sedated             Vital Signs: BP 103/82   Pulse (!) 120   Temp 98.4 F (36.9 C) (Axillary)   Resp (!) 23   Ht '6\' 8"'$  (2.032 m)   Wt 129.4 kg   SpO2 (!) 89%   BMI 31.34 kg/m  SpO2: SpO2: (!) 89 % O2 Device: O2 Device: Ventilator O2 Flow Rate: O2 Flow Rate (L/min): 15 L/min  Intake/output summary:   Intake/Output Summary (Last 24 hours) at 05/14/2019 1302 Last data filed at 05/14/2019 0930 Gross per 24 hour  Intake 4928.57 ml  Output 4400 ml  Net 528.57 ml   LBM: Last BM Date: 05/08/19 Baseline Weight: Weight: 129.3 kg Most recent weight: Weight: 129.4 kg       Palliative Assessment/Data: PPS: 10%      Patient Active Problem List   Diagnosis Date Noted  . Thrombocytopenia ([place]) 05/13/2019  . Acute respiratory distress syndrome (ARDS) ([place]) 05/12/2019  . Goals of care, counseling/discussion   . Advanced care planning/counseling discussion   . Palliative care by specialist   . Pneumonitis   . Diverticulitis of sigmoid colon vs Appendicitis 06/02/2017  . Dysuria 04/03/2017  . 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 Encounter for antineoplastic immunotherapy 03/12/2017  . Primary malignant neoplasm of right lung metastatic to other  site ([place]) 02/27/2017  . Lung mass   . Atrial flutter ([place])   . Hypokalemia 01/27/2017  . Adrenal mass ([place]) 01/27/2017  . Tobacco abuse 01/27/2017  . COPD (chronic obstructive pulmonary disease) ([place]) 01/27/2017  . Hypertension 01/27/2017  . High cholesterol 01/27/2017  . Chronic kidney disease 01/27/2017  . Hyponatremia 01/27/2017  . Leukocytosis 01/27/2017  . RLQ abdominal pain 01/27/2017  . Diverticulitis of intestine with perforation 01/26/2017  . Atrial fibrillation with rapid ventricular response ([place]) 01/26/2017  . 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 -admitted on2/4/2021with worsening SOB which progressed to respiratory failure and he is now intubated. 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 Palliative consulted for [place].  Assessment/Recommendations/Plan   Plan to meet with Holley Raring and patient's children tomorrow 2/12 at 30  Glenda and his children are certain he would not want life prolonged without hope of meaningful functional recover- no trach, no PEG, no LTACH  Goals of Care and Additional Recommendations:  Limitations on Scope of Treatment: Full Scope Treatment and No Tracheostomy  Code Status:  DNR  Prognosis:   Unable to determine  Discharge Planning:  To Be Determined  Care plan was discussed with patient's spouse- Holley Raring  Thank you for allowing the Palliative Medicine Team to assist in the care of this patient.   Time In: 1130 Time  Out: 1205 Total Time 35 minutes Prolonged Time Billed no      Greater than 50%  of this time was spent  counseling and coordinating care related to the above assessment and plan.  Mariana Kaufman, AGNP-C Palliative Medicine   Please contact Palliative Medicine Team phone at [phone] for questions and concerns.

## 2019-05-15 ENCOUNTER — Inpatient Hospital Stay (HOSPITAL_COMMUNITY): Payer: Medicare Other

## 2019-05-15 ENCOUNTER — Encounter (HOSPITAL_COMMUNITY): Payer: Self-pay | Admitting: Family Medicine

## 2019-05-15 DIAGNOSIS — Z515 Encounter for palliative care: Secondary | ICD-10-CM

## 2019-05-15 DIAGNOSIS — R140 Abdominal distension (gaseous): Secondary | ICD-10-CM

## 2019-05-15 DIAGNOSIS — I4892 Unspecified atrial flutter: Secondary | ICD-10-CM

## 2019-05-15 LAB — COMPREHENSIVE METABOLIC PANEL
ALT: 39 U/L (ref 0–44)
AST: 20 U/L (ref 15–41)
Albumin: 2.4 g/dL — ABNORMAL LOW (ref 3.5–5.0)
Alkaline Phosphatase: 93 U/L (ref 38–126)
Anion gap: 8 (ref 5–15)
BUN: 37 mg/dL — ABNORMAL HIGH (ref 8–23)
CO2: 23 mmol/L (ref 22–32)
Calcium: 8.6 mg/dL — ABNORMAL LOW (ref 8.9–10.3)
Chloride: 97 mmol/L — ABNORMAL LOW (ref 98–111)
Creatinine, Ser: 0.92 mg/dL (ref 0.61–1.24)
GFR calc Af Amer: >60 mL/min (ref >60)
GFR calc non Af Amer: >60 mL/min (ref >60)
Glucose, Bld: 204 mg/dL — ABNORMAL HIGH (ref 70–99)
Potassium: 5.5 mmol/L — ABNORMAL HIGH (ref 3.5–5.1)
Sodium: 128 mmol/L — ABNORMAL LOW (ref 135–145)
Total Bilirubin: 0.6 mg/dL (ref 0.3–1.2)
Total Protein: 6.1 g/dL — ABNORMAL LOW (ref 6.5–8.1)

## 2019-05-15 LAB — HEPARIN LEVEL (UNFRACTIONATED)
Heparin Unfractionated: 0.64 IU/mL (ref 0.30–0.70)
Heparin Unfractionated: 0.63 IU/mL (ref 0.30–0.70)

## 2019-05-15 LAB — CBC
HCT: 45.7 % (ref 39.0–52.0)
Hemoglobin: 15.1 g/dL (ref 13.0–17.0)
MCH: 28.2 pg (ref 26.0–34.0)
MCHC: 33 g/dL (ref 30.0–36.0)
MCV: 85.4 fL (ref 80.0–100.0)
Platelets: 182 10*3/uL (ref 150–400)
RBC: 5.35 MIL/uL (ref 4.22–5.81)
RDW: 13.5 % (ref 11.5–15.5)
WBC: 15.1 10*3/uL — ABNORMAL HIGH (ref 4.0–10.5)
nRBC: 0 % (ref 0.0–0.2)

## 2019-05-15 LAB — GLUCOSE, CAPILLARY
Glucose-Capillary: 171 mg/dL — ABNORMAL HIGH (ref 70–99)
Glucose-Capillary: 183 mg/dL — ABNORMAL HIGH (ref 70–99)
Glucose-Capillary: 189 mg/dL — ABNORMAL HIGH (ref 70–99)
Glucose-Capillary: 206 mg/dL — ABNORMAL HIGH (ref 70–99)

## 2019-05-15 LAB — MAGNESIUM: Magnesium: 2.4 mg/dL (ref 1.7–2.4)

## 2019-05-15 LAB — SEDIMENTATION RATE: Sed Rate: 18 mm/hr — ABNORMAL HIGH (ref 0–16)

## 2019-05-15 LAB — BRAIN NATRIURETIC PEPTIDE: B Natriuretic Peptide: 135 pg/mL — ABNORMAL HIGH (ref 0.0–100.0)

## 2019-05-15 MED ORDER — HALOPERIDOL 0.5 MG PO TABS: 0.5000 mg | ORAL_TABLET | ORAL | Status: DC | PRN

## 2019-05-15 MED ORDER — MORPHINE 100MG IN NS 100ML (1MG/ML) PREMIX INFUSION
2.0000 mg/h | INTRAVENOUS | Status: DC
  Administered 2019-05-15: 14:00:00 2 mg/h via INTRAVENOUS
  Filled 2019-05-15: qty 100

## 2019-05-15 MED ORDER — MORPHINE BOLUS VIA INFUSION
2.0000 mg | INTRAVENOUS | Status: DC | PRN
  Administered 2019-05-15: 2 mg via INTRAVENOUS
  Filled 2019-05-15: qty 2

## 2019-05-15 MED ORDER — LORAZEPAM 2 MG/ML IJ SOLN
2.0000 mg | INTRAMUSCULAR | Status: DC
  Administered 2019-05-15: 2 mg via INTRAVENOUS
  Filled 2019-05-15: qty 1

## 2019-05-15 MED ORDER — METOPROLOL TARTRATE 5 MG/5ML IV SOLN: 5.0000 mg | Freq: Three times a day (TID) | INTRAVENOUS | Status: DC

## 2019-05-15 MED ORDER — ONDANSETRON 4 MG PO TBDP: 4.0000 mg | ORAL_TABLET | Freq: Four times a day (QID) | ORAL | Status: DC | PRN

## 2019-05-15 MED ORDER — ACETAMINOPHEN 325 MG PO TABS: 650.0000 mg | ORAL_TABLET | Freq: Four times a day (QID) | ORAL | Status: DC | PRN

## 2019-05-15 MED ORDER — BIOTENE DRY MOUTH MT LIQD: 15.0000 mL | OROMUCOSAL | Status: DC | PRN

## 2019-05-15 MED ORDER — HALOPERIDOL LACTATE 5 MG/ML IJ SOLN: 0.5000 mg | INTRAMUSCULAR | Status: DC | PRN

## 2019-05-15 MED ORDER — GLYCOPYRROLATE 0.2 MG/ML IJ SOLN: 0.2000 mg | INTRAMUSCULAR | Status: DC | PRN

## 2019-05-15 MED ORDER — DILTIAZEM HCL-DEXTROSE 125-5 MG/125ML-% IV SOLN (PREMIX)
5.0000 mg/h | INTRAVENOUS | Status: DC
  Administered 2019-05-15: 02:00:00 5 mg/h via INTRAVENOUS
  Administered 2019-05-15: 15 mg/h via INTRAVENOUS
  Filled 2019-05-15 (×2): qty 125

## 2019-05-15 MED ORDER — ACETAMINOPHEN 650 MG RE SUPP: 650.0000 mg | Freq: Four times a day (QID) | RECTAL | Status: DC | PRN

## 2019-05-15 MED ORDER — PROTAMINE SULFATE 10 MG/ML IV SOLN
35.0000 mg | Freq: Once | INTRAVENOUS | Status: AC
  Administered 2019-05-15: 35 mg via INTRAVENOUS
  Filled 2019-05-15: qty 3.5

## 2019-05-15 MED ORDER — POLYVINYL ALCOHOL 1.4 % OP SOLN: 1.0000 [drp] | Freq: Four times a day (QID) | OPHTHALMIC | Status: DC | PRN

## 2019-05-15 MED ORDER — MORPHINE BOLUS VIA INFUSION
4.0000 mg | INTRAVENOUS | Status: DC | PRN
  Administered 2019-05-15 (×2): 4 mg via INTRAVENOUS
  Filled 2019-05-15: qty 4

## 2019-05-15 MED ORDER — GLYCOPYRROLATE 1 MG PO TABS: 1.0000 mg | ORAL_TABLET | ORAL | Status: DC | PRN

## 2019-05-15 MED ORDER — HEPARIN SODIUM (PORCINE) 5000 UNIT/ML IJ SOLN: 5000.0000 [IU] | Freq: Three times a day (TID) | INTRAMUSCULAR | Status: DC

## 2019-05-15 MED ORDER — LORAZEPAM 2 MG/ML IJ SOLN
2.0000 mg | INTRAMUSCULAR | Status: DC | PRN
  Administered 2019-05-15: 16:00:00 2 mg via INTRAVENOUS
  Filled 2019-05-15: qty 1

## 2019-05-15 MED ORDER — HALOPERIDOL LACTATE 2 MG/ML PO CONC: 0.5000 mg | ORAL | Status: DC | PRN

## 2019-05-15 MED ORDER — GLYCOPYRROLATE 0.2 MG/ML IJ SOLN
0.2000 mg | INTRAMUSCULAR | Status: DC | PRN
  Administered 2019-05-15: 14:00:00 0.2 mg via INTRAVENOUS
  Filled 2019-05-15: qty 1

## 2019-05-15 MED ORDER — ONDANSETRON HCL 4 MG/2ML IJ SOLN: 4.0000 mg | Freq: Four times a day (QID) | INTRAMUSCULAR | Status: DC | PRN

## 2019-05-26 ENCOUNTER — Telehealth: Payer: Self-pay | Admitting: Medical Oncology

## 2019-05-26 NOTE — Telephone Encounter (Signed)
Called Glenda and expressed our sympathy.

## 2019-05-28 ENCOUNTER — Ambulatory Visit (HOSPITAL_COMMUNITY): Payer: Medicare Other

## 2019-05-28 ENCOUNTER — Other Ambulatory Visit: Payer: Medicare Other

## 2019-06-01 NOTE — Progress Notes (Signed)
HR dropped from 120s to 60s. Family notified. I went to family room to bring significant other and son into patients room. On arrival HR 28 patient not breathing. Family currently at bedside.

## 2019-06-01 NOTE — ED Provider Notes (Signed)
Received a consult from hospitalist colleague Dr Heath Lark, to evaluate the patient for persistent right epistaxis.  Patient is in the ICU, admitted and intubated for acute respiratory failure, atrial fibrillation with rapid ventricular response for which he is on heparin and diltiazem.  Physical exam Sedate on multiple medications ENT patient has dried blood bilateral nares and active oozing from the right nare.  No hematoma appreciated. Extremities: legs edematous  Patient has endotracheal tube in place.  Patient on ventilator. Neurologically has minimal response to verbal at this time, barely will open his eyes.  Marland KitchenEpistaxis Management  Date/Time: 06-07-2019 11:28 AM Performed by: Elnora Morrison, MD Authorized by: Elnora Morrison, MD   Consent:    Consent obtained:  Emergent situation   Consent given by:  Healthcare agent   Risks discussed:  Bleeding Anesthesia (see MAR for exact dosages):    Anesthesia method:  None Procedure details:    Treatment site:  R posterior   Treatment method:  Nasal balloon   Treatment complexity:  Extensive   Treatment episode: initial   Post-procedure details:    Assessment:  Bleeding decreased   Patient tolerance of procedure:  Tolerated well, no immediate complications Comments:     Rhinorocket    Assessment and plan With patient being on heparin drip and critically ill decision to place a posterior Rhino Rocket in the right nare to help control bleeding.  Discussed with nurse practitioner nurse how to adjust the pressure as needed with air in the syringe.  Please not hesitate to call emergency room for further adjustment or recommendations.  Golda Acre, MD 07-Jun-2019 [phone]

## 2019-06-01 NOTE — Progress Notes (Signed)
Daily Progress Note   Patient Name: Aaron Sellers       Date: June 13, 2019 DOB: 08-27-1950  Age: 69 y.o. MRN#: [id number] Attending Physician: Rodena Goldmann, DO Primary Care Physician: Janece Canterbury Admit Date: 05/20/2019  Reason for Consultation/Follow-up: Establishing goals of care  Subjective: Patient in bed- awakens to voice. With severe epistaxis- having rhino rocket placed. Now with illeus.  Discussed trajectories with patient's family considering patient's multiple comorbidities and goals for care. Family aware that if patient is able to survive this hospitalization he will be even more debilitated than he was prior to admission, and would likely decline again in the near future toward end of life.  Family expressed that patient was loving and kind. They all agree that patient would not want to live debilitated, or in a nursing facility, unable to work his land.  The decision was made to transition Aaron Sellers full comfort measures only with extubation. Chaplain Joya Gaskins was also kindly present and provided spiritual support.  Review of Systems  Unable to perform ROS: Intubated    Length of Stay: 8  Current Medications: Scheduled Meds:  . LORazepam  2 mg Intravenous Q4H  . mouth rinse  15 mL Mouth Rinse 10 times per day  . pantoprazole (PROTONIX) IV  40 mg Intravenous Q24H    Continuous Infusions: . morphine      PRN Meds: acetaminophen **OR** acetaminophen, acetaminophen, glycopyrrolate **OR** glycopyrrolate **OR** glycopyrrolate, haloperidol **OR** [DISCONTINUED] haloperidol **OR** haloperidol lactate, LORazepam, morphine, ondansetron **OR** ondansetron (ZOFRAN) IV, oxymetazoline, polyvinyl alcohol  Physical Exam Vitals and nursing note reviewed.  Constitutional:    Appearance: He is ill-appearing and toxic-appearing.  Skin:    Comments: flushed  Neurological:     Comments: awake             Vital Signs: BP 115/85 (BP Location: Left Arm)   Pulse (!) 135   Temp 98.2 F (36.8 C) (Axillary)   Resp (!) 22   Ht 6\' 8"  (2.032 m)   Wt 130.4 kg   SpO2 90%   BMI 31.58 kg/m  SpO2: SpO2: 90 % O2 Device: O2 Device: Ventilator O2 Flow Rate: O2 Flow Rate (L/min): 15 L/min  Intake/output summary:   Intake/Output Summary (Last 24 hours) at 06/13/2019 1307 Last data filed at 06-13-19 1129 Gross per 24  hour  Intake 6193.21 ml  Output 1550 ml  Net 4643.21 ml   LBM: Last BM Date: 05/08/19 Baseline Weight: Weight: 129.3 kg Most recent weight: Weight: 130.4 kg       Palliative Assessment/Data: PPS: 10%       Patient Active Problem List   Diagnosis Date Noted  . Oxygen desaturation   . Acute respiratory failure with hypoxemia ([place])   . Thrombocytopenia ([place]) 05/13/2019  . Acute respiratory distress syndrome (ARDS) ([place]) 05/12/2019  . Goals of care, counseling/discussion   . Advanced care planning/counseling discussion   . Palliative care by specialist   . Pneumonitis   . Diverticulitis of sigmoid colon vs Appendicitis 06/02/2017  . Dysuria 04/03/2017  . 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 Encounter for antineoplastic immunotherapy 03/12/2017  . Primary malignant neoplasm of right lung metastatic to other site ([place]) 02/27/2017  . Lung mass   . Atrial flutter ([place])   . Hypokalemia 01/27/2017  . Adrenal mass ([place]) 01/27/2017  . Tobacco abuse 01/27/2017  . COPD (chronic obstructive pulmonary disease) ([place]) 01/27/2017  . Hypertension 01/27/2017  . High cholesterol 01/27/2017  . Chronic kidney disease 01/27/2017  . Hyponatremia 01/27/2017  . Leukocytosis 01/27/2017  . RLQ abdominal pain 01/27/2017  . Diverticulitis of intestine with perforation 01/26/2017  . Atrial fibrillation with rapid ventricular response  ([place]) 01/26/2017  . 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 -admitted on2/4/2021with worsening SOB which progressed to respiratory failure and he is now intubated. 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 Palliative consulted for Aaron Sellers.  Assessment/Recommendations/Plan   Patient with multiple comordities at high risk for sudden death or severe debilitation  Plan made with family for transition to full comfort measures only and allow for natural dying process with symptom management  Morphine infusion 2mg /hr with 2mg  bolus for dyspnea  Lorazepam 2mg  q4hr for agitation, 2mg  q4hr prn if needed for agitation or dyspnea uncontrolled with morphine  D/C all medications and interventions that are not comfort focused  Extubate when patient is comfortable and family is ready  Goals of Care and Additional Recommendations:  Limitations on Scope of Treatment: Full Comfort Care  Code Status:  DNR  Prognosis:   Hours - Days  Discharge Planning:  Anticipated Hospital Death  Care plan was discussed with patient's life partner- Aaron Sellers and three of patient's children, Dr. Manuella Ghazi and Dr. Melvyn Novas.  Thank you for allowing the Palliative Medicine Team to assist in the care of this patient.   Time In: 1100 Time Out: 1300 Total Time 120 minutes Prolonged Time Billed yes      Greater than 50%  of this time was spent counseling and coordinating care related to the above assessment and  plan.  Mariana Kaufman, AGNP-C Palliative Medicine   Please contact Palliative Medicine Team phone at [phone] for questions and concerns.

## 2019-06-01 NOTE — Progress Notes (Signed)
**Note De-Identified  Obfuscation** RN notified RT that the patients ETT had slipped.  ETT tube pushed back to 24 @ lip and ETCO2 was used to confirm proper placement. Patient tolerated well.  RRT to continue to monitor.

## 2019-06-01 NOTE — Death Summary Note (Signed)
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 Patient states that he has been having shortness of breath with palpitations and at times was lightheaded. Denies any chest pain. He was found to be in A. fib with RVR, admitted to stepdown for heart rate control on Cardizem drip, as well patient had progressive hypoxia within first 24 hours on admission, which has escalated rapidly, he D-dimers were elevated, CTA chest with no evidence of PE, but significant for severe interstitial lung disease, high clinical suspicion for Keytruda pneumonitis, received infliximab 2/7 patient with increased work of breathing on BiPAP, more lethargic, required intubationon2/8.  2/10:Plan to continue current antibiotics as well as high-dose steroids. Diltiazem being weaned by cardiology with use of digoxin as well as Lopressor. 2D echocardiogram obtained and pending. Pressors have been weaned off. Continue on heparin drip in case cardioversion is required and monitor platelet counts closely.  2/11:Continue current antibiotics and high-dose steroids. Appreciate cardiology recommendations with IV Lopressor and digoxin. Continue heparin drip. Correct hyperkalemia with Kayexalate and monitor closely. He is having worsening abdominal distention with ileus/SBO confirmed on imaging. We will keep on low intermittent suction in order Dulcolax suppositories.  05-20-22: Patient  continued to have persistent epistaxis for which Rhino Rocket was placed by EDP.  Shortly thereafter, he began to have some dislodgment of his endotracheal tube.  Palliative discussions with family members outlined multiple comorbidities at high risk for sudden death in family members did not want to pursue any reintubation attempts.  He has now been transitioned to morphine drip with plans to extubate when patient is comfortable and family is ready.  Patient was extubated by 1700 on May 20, 2022 and was maintained on comfort measures with morphine drip arranged by palliative care.  His heart rate remained elevated for period of a couple hours and then began to drop.  Family members were notified and were present in the room.  His heart rate had dropped significantly and he was found to not have any further respirations.  He passed peacefully on May 21, 2019 at 1943.  Discharge Diagnoses:  Active Problems:   Atrial fibrillation with rapid ventricular response (HCC)   Primary malignant neoplasm of right lung metastatic to other site [facility])   Pneumonitis   Goals of care, counseling/discussion   Advanced care planning/counseling discussion   Palliative care by specialist   Acute respiratory distress syndrome (ARDS) (HCC)   Thrombocytopenia (HCC)   Oxygen desaturation   Acute respiratory failure with hypoxemia (HCC)   Abdominal distention   Comfort measures only status   Terminal care  Cardiopulmonary respiratory arrest secondary to profound hypoxemia after compassionate extubation  Acute hypoxemic respiratory failure-multifactorial with high suspicion of Keytruda pneumonitis as well as invasive fungal infection  Atrial fibrillation with RVR  Hyperkalemia/hyponatremia  Worsening ileus  Stage IV squamous cell carcinoma  Acute thrombocytopenia  Discharge Instructions   Allergies as of 05-21-2019      Reactions   Ciprofloxacin    HIVES   Adhesive [tape] Other (See Comments)   Skin peels    Codeine Other (  See Comments)   Jitters, "skin crawling"      Medication List    ASK your doctor about these medications   acetaminophen 500 MG tablet Commonly known as: TYLENOL Take 1,000 mg by mouth every 6 (six) hours as needed for moderate pain.   atorvastatin 10 MG tablet Commonly known as: LIPITOR Take 10 mg by mouth every evening.   Cartia XT 180 MG 24 hr capsule Generic drug: diltiazem Take 1 capsule by mouth once daily   [employer] Foot 1 % cream Generic drug: terbinafine Apply 1 application topically daily.   famotidine 10 MG tablet Commonly known as: PEPCID Take 10 mg by mouth as needed for heartburn or indigestion.   furosemide 40 MG tablet Commonly known as: LASIX Take 1 tablet (40 mg total) by mouth daily as needed.   gabapentin 300 MG capsule Commonly known as: NEURONTIN TAKE 1 CAPSULE BY MOUTH THREE TIMES DAILY   loratadine 10 MG tablet Commonly known as: CLARITIN Take 10 mg by mouth daily as needed for allergies.   predniSONE 20 MG tablet Commonly known as: Deltasone Take 6 tablets daily for 2 weeks, then take 5 tablets daily for 1 week, then take 4 tablets for 1 week, then take 3 tablets for 1 week, then take 2 tablet for 1 week, then take 1 tablet for 1 week, then take 0.5 tablets for 1 week.   tamsulosin 0.4 MG Caps capsule Commonly known as: FLOMAX Take 0.4 mg by mouth daily.   VITAMIN B-12 PO Take 1 tablet by mouth daily.       Allergies  Allergen Reactions  . Ciprofloxacin     HIVES  . Adhesive [Tape] Other (See Comments)    Skin peels  . Codeine Other (See Comments)    Jitters, "skin crawling"    Consultations:  Cardiology  PCCM  Oncology  Palliative care   Procedures/Studies: DG Chest 2 View  Result Date: 05/16/2019 CLINICAL DATA:  Shortness of breath. EXAM: CHEST - 2 VIEW COMPARISON:  January 26, 2017 FINDINGS: Mild infiltrates are seen within the mid left lung and bilateral lung bases, left greater than right.  There is a mild amount of pleural fluid seen along the lateral aspect of the mid right lung. No pneumothorax is identified. The cardiac silhouette is mildly enlarged. The visualized skeletal structures are unremarkable. IMPRESSION: 1. Mild bilateral infiltrates. 2. Small lateral right pleural effusion. A small amount of loculated fluid cannot be excluded. Electronically Signed   By: Virgina Norfolk M.D.   On: 05/06/2019 17:02   DG Abd 1 View  Result Date: 05/14/2019 CLINICAL DATA:  Abdominal distension. EXAM: ABDOMEN - 1 VIEW COMPARISON:  May 13, 2019. FINDINGS: Mildly dilated small bowel loops are noted concerning for ileus or possibly distal small bowel obstruction. Nasogastric tube tip is seen in proximal stomach. No colonic dilatation is noted. No radio-opaque calculi or other significant radiographic abnormality are seen. IMPRESSION: Nasogastric tube tip seen in proximal stomach. Mildly dilated small bowel loops are noted concerning for ileus or possibly distal small bowel obstruction. Electronically Signed   By: Marijo Conception M.D.   On: 05/14/2019 14:45   DG Abd 1 View  Result Date: 05/13/2019 CLINICAL DATA:  OG tube placement EXAM: ABDOMEN - 1 VIEW COMPARISON:  05/13/2019 FINDINGS: OG tube tip is in the stomach. Dilated small bowel loops concerning for small bowel obstruction. Gas also seen within nondistended colon. IMPRESSION: NG tube tip in the stomach. Concern for small bowel obstruction. Electronically  Signed   By: Rolm Baptise M.D.   On: 05/13/2019 18:10   DG Abd 1 View  Result Date: 05/13/2019 CLINICAL DATA:  Orogastric tube placement. EXAM: ABDOMEN - 1 VIEW COMPARISON:  May 11, 2019. FINDINGS: The bowel gas pattern is normal. Distal tip of enteric tube is seen in proximal stomach. No radio-opaque calculi or other significant radiographic abnormality are seen. IMPRESSION: Distal tip of enteric tube seen in proximal stomach. Electronically Signed   By: Marijo Conception M.D.   On:  05/13/2019 11:31   DG Abd 1 View  Result Date: 05/11/2019 CLINICAL DATA:  Enteric catheter placement EXAM: ABDOMEN - 1 VIEW COMPARISON:  05/11/2019 FINDINGS: Two frontal views of the left upper quadrant of the abdomen are obtained. No enteric catheter is identified. The bowel gas pattern is unremarkable. IMPRESSION: 1. No enteric catheter is identified. Electronically Signed   By: Randa Ngo M.D.   On: 05/11/2019 17:14   CT ANGIO CHEST PE W OR WO CONTRAST  Result Date: 05/08/2019 CLINICAL DATA:  Shortness of breath. EXAM: CT ANGIOGRAPHY CHEST WITH CONTRAST TECHNIQUE: Multidetector CT imaging of the chest was performed using the standard protocol during bolus administration of intravenous contrast. Multiplanar CT image reconstructions and MIPs were obtained to evaluate the vascular anatomy. CONTRAST:  114mL OMNIPAQUE IOHEXOL 350 MG/ML SOLN COMPARISON:  March 30, 2019 FINDINGS: Cardiovascular: There is moderate severity calcification of the thoracic aorta satisfactory opacification of the pulmonary arteries to the segmental level. No evidence of pulmonary embolism. Normal heart size. No pericardial effusion. Mediastinum/Nodes: There is mild pretracheal lymphadenopathy. Lungs/Pleura: Marked severity diffuse ground-glass appearing infiltrates are seen throughout both lungs. There is no evidence of a pleural effusion or pneumothorax. Upper Abdomen: A stable 2.5 cm x 1.4 cm low-attenuation right adrenal mass is seen. Musculoskeletal: Multilevel degenerative changes seen throughout the thoracic spine. Review of the MIP images confirms the above findings. IMPRESSION: 1. Marked severity diffuse bilateral ground-glass appearing infiltrates. 2. No evidence of pulmonary embolism. 3. Stable low-attenuation right adrenal mass which may represent an adrenal adenoma. Electronically Signed   By: Virgina Norfolk M.D.   On: 05/08/2019 16:11   DG Chest Port 1 View  Result Date: 05-31-19 CLINICAL DATA:  ICU,  intubated, acute respiratory failure with hypoxemia EXAM: PORTABLE CHEST 1 VIEW COMPARISON:  Radiograph 05/12/2019 FINDINGS: *Endotracheal tube terminates in the mid trachea 5 cm from the carina *Transesophageal tube tip and side port distal to the GE junction, terminating within the air distended gastric lumen. *Right upper extremity PICC terminates in the lower SVC. *Telemetry leads overlie the chest. Extensive bilateral pulmonary opacities are similar to prior counting for differences in technique. No pneumothorax or effusion. Cardiomediastinal contours are unchanged. No acute osseous or soft tissue abnormality. IMPRESSION: Lines and tubes as above. Persistent bilateral airspace disease. No significant interval change. Electronically Signed   By: Lovena Le M.D.   On: 05-31-2019 05:54   DG CHEST PORT 1 VIEW  Result Date: 05/12/2019 CLINICAL DATA:  Orogastric tube placement EXAM: PORTABLE CHEST 1 VIEW COMPARISON:  Yesterday FINDINGS: Endotracheal tube tips are the clavicular heads. The orogastric tube tip and side-port are at the stomach. Cardiomegaly and diffuse pulmonary opacity is unchanged. Right PICC with tip at the upper SVC. No visible effusion or air leak. IMPRESSION: 1. Unremarkable hardware positioning. 2. Unchanged extensive pulmonary opacity. Electronically Signed   By: Monte Fantasia M.D.   On: 05/12/2019 04:48   DG CHEST PORT 1 VIEW  Result Date: 05/11/2019 CLINICAL DATA:  Et tube and ng tube placement EXAM: PORTABLE CHEST 1 VIEW COMPARISON:  Radiograph 05/09/2019 FINDINGS: Endotracheal tube 4.6 cm from carina. RIGHT PICC line tip in distal SVC. No NG tube identified. Stable enlarged cardiac silhouette. Bilateral patchy airspace disease. IMPRESSION: 1. Endotracheal tube in good position. 2. NG tube not identified. 3. Bilateral patchy airspace disease Electronically Signed   By: Suzy Bouchard M.D.   On: 05/11/2019 14:52   DG CHEST PORT 1 VIEW  Result Date: 05/09/2019 CLINICAL DATA:   PICC placement EXAM: PORTABLE CHEST 1 VIEW COMPARISON:  Yesterday FINDINGS: Patchy bilateral pneumonia. There is cardiomegaly and vascular pedicle widening. Right PICC with tip at the SVC. No evidence of effusion or pneumothorax. IMPRESSION: 1. Right-sided PICC in good position. 2. Extensive bilateral pneumonia. Electronically Signed   By: Monte Fantasia M.D.   On: 05/09/2019 14:02   DG Chest Port 1 View  Result Date: 05/08/2019 CLINICAL DATA:  History of lung cancer, oxygen desaturation EXAM: PORTABLE CHEST 1 VIEW COMPARISON:  05/25/2019 FINDINGS: Bilateral interstitial and patchy alveolar airspace opacities. No pleural effusion or pneumothorax. Stable cardiomediastinal silhouette. No aggressive osseous lesion. IMPRESSION: Bilateral patchy interstitial and alveolar airspace opacities as can be seen with multilobar pneumonia. Electronically Signed   By: Kathreen Devoid   On: 05/08/2019 12:14   ECHOCARDIOGRAM COMPLETE  Result Date: 05/13/2019    ECHOCARDIOGRAM REPORT   Patient Name:   Aaron Sellers Date of Exam: 05/13/2019 Medical Rec #:  [id number]    Height:       80.0 in Accession #:    [id number]   Weight:       281.7 lb Date of Birth:  [birth year]    BSA:          2.66 m Patient Age:    58 years     BP:           107/71 mmHg Patient Gender: M            HR:           82 bpm. Exam Location:  [facility] Procedure: 2D Echo Indications:    Atrial Fibrillation 427.31 / I48.91  History:        Patient has prior history of Echocardiogram examinations, most                 recent 06/01/2017. COPD, Arrythmias:Atrial Fibrillation and Atrial                 Flutter; Risk Factors:Former Smoker, Dyslipidemia and                 Hypertension. Lung mass,Primary malignant neoplasm of right lung                 metastatic to other site.  Sonographer:    Leavy Cella RDCS (AE) Referring Phys: [id number] [place]  1. Left ventricular ejection fraction, by estimation, is 45%. The left ventricle has mildly  decreased function. The left ventrical has no regional wall motion abnormalities. There is mildly increased left ventricular hypertrophy. Left ventricular diastolic parameters are indeterminate.  2. Right ventricular systolic function is normal. The right ventricular size is normal. There is mildly elevated pulmonary artery systolic pressure.  3. The mitral valve is normal in structure and function. trivial mitral valve regurgitation. No evidence of mitral stenosis.  4. The aortic valve is tricuspid. Aortic valve regurgitation is not visualized. No aortic stenosis is present. FINDINGS  Left Ventricle: Left ventricular ejection fraction, by  estimation, is 45%. The left ventricle has mildly decreased function. The left ventricle has no regional wall motion abnormalities. There is mildly increased left ventricular hypertrophy. Left ventricular diastolic parameters are indeterminate. Right Ventricle: The right ventricular size is normal. No increase in right ventricular wall thickness. Right ventricular systolic function is normal. There is mildly elevated pulmonary artery systolic pressure. The tricuspid regurgitant velocity is 2.52  m/s, and with an assumed right atrial pressure of 10 mmHg, the estimated right ventricular systolic pressure is 78.2 mmHg. Left Atrium: Left atrial size was normal in size. Right Atrium: Right atrial size was normal in size. Pericardium: There is no evidence of pericardial effusion. Mitral Valve: The mitral valve is normal in structure and function. Trivial mitral valve regurgitation. No evidence of mitral valve stenosis. Tricuspid Valve: The tricuspid valve is normal in structure. Tricuspid valve regurgitation is mild . No evidence of tricuspid stenosis. Aortic Valve: The aortic valve is tricuspid. Aortic valve regurgitation is not visualized. No aortic stenosis is present. Aortic valve mean gradient measures 4.9 mmHg. Aortic valve peak gradient measures 8.7 mmHg. Aortic valve area, by  VTI measures 1.74 cm. Pulmonic Valve: The pulmonic valve was not well visualized. Pulmonic valve regurgitation is not visualized. No evidence of pulmonic stenosis. Aorta: The aortic root is normal in size and structure. Pulmonary Artery: Indeterminate PASP, inadequate TR jet. Venous: The inferior vena cava was not well visualized. IAS/Shunts: No atrial level shunt detected by color flow Doppler.  LEFT VENTRICLE PLAX 2D LVIDd:         4.86 cm  Diastology LVIDs:         4.01 cm  LV e' lateral:   9.03 cm/s LV PW:         1.34 cm  LV E/e' lateral: 9.9 LV IVS:        1.15 cm  LV e' medial:    6.96 cm/s LVOT diam:     2.10 cm  LV E/e' medial:  12.9 LV SV:         47.58 ml LV SV Index:   14.87 LVOT Area:     3.46 cm  RIGHT VENTRICLE RV S prime:     11.10 cm/s TAPSE (M-mode): 1.8 cm LEFT ATRIUM             Index       RIGHT ATRIUM           Index LA diam:        2.90 cm 1.09 cm/m  RA Area:     19.00 cm LA Vol (A2C):   68.4 ml 25.71 ml/m RA Volume:   51.10 ml  19.21 ml/m LA Vol (A4C):   53.2 ml 20.00 ml/m LA Biplane Vol: 65.1 ml 24.47 ml/m  AORTIC VALVE AV Area (Vmax):    1.82 cm AV Area (Vmean):   1.65 cm AV Area (VTI):     1.74 cm AV Vmax:           147.80 cm/s AV Vmean:          106.122 cm/s AV VTI:            0.273 m AV Peak Grad:      8.7 mmHg AV Mean Grad:      4.9 mmHg LVOT Vmax:         77.61 cm/s LVOT Vmean:        50.669 cm/s LVOT VTI:          0.137 m LVOT/AV VTI ratio:  0.50  AORTA Ao Root diam: 3.40 cm MITRAL VALVE                        TRICUSPID VALVE MV Area (PHT): 4.80 cm             TR Peak grad:   25.4 mmHg MV Decel Time: 158 msec             TR Vmax:        252.00 cm/s MV E velocity: 89.70 cm/s 103 cm/s MV A velocity: 30.60 cm/s 70.3 cm/s SHUNTS MV E/A ratio:  2.93       1.5       Systemic VTI:  0.14 m                                     Systemic Diam: 2.10 cm Carlyle Dolly MD Electronically signed by Carlyle Dolly MD Signature Date/Time: 05/13/2019/3:45:36 PM    Final    [place] EKG SITE  RITE  Result Date: 05/08/2019 If Site Rite image not attached, placement could not be confirmed due to current cardiac rhythm.    Discharge Exam: Vitals:   May 31, 2019 1136 05/31/2019 1221  BP:    Pulse:    Resp:    Temp: 98.2 F (36.8 C)   SpO2:  90%   Vitals:   May 31, 2019 0809 2019-05-31 1136 05-31-19 1221 May 31, 2019 1900  BP:      Pulse:      Resp:      Temp:  98.2 F (36.8 C)    TempSrc:  Axillary    SpO2: 93%  90%   Weight:    130.4 kg  Height:    6\' 8"  (2.032 m)    The results of significant diagnostics from this hospitalization (including imaging, microbiology, ancillary and laboratory) are listed below for reference.     Microbiology: Recent Results (from the past 240 hour(s))  SARS CORONAVIRUS 2 (TAT 6-24 HRS) Nasopharyngeal Nasopharyngeal Swab     Status: None   Collection Time: 05/26/2019  7:36 PM   Specimen: Nasopharyngeal Swab  Result Value Ref Range Status   SARS Coronavirus 2 NEGATIVE NEGATIVE Final    Comment: (NOTE) SARS-CoV-2 target nucleic acids are NOT DETECTED. The SARS-CoV-2 RNA is generally detectable in upper and lower respiratory specimens during the acute phase of infection. Negative results do not preclude SARS-CoV-2 infection, do not rule out co-infections with other pathogens, and should not be used as the sole basis for treatment or other patient management decisions. Negative results must be combined with clinical observations, patient history, and epidemiological information. The expected result is Negative. Fact Sheet for Patients: SugarRoll.be Fact Sheet for Healthcare Providers: https://www.woods-mathews.com/ This test is not yet approved or cleared by the [place] FDA and  has been authorized for detection and/or diagnosis of SARS-CoV-2 by FDA under an Emergency Use Authorization (EUA). This EUA will remain  in effect (meaning this test can be used) for the duration of the COVID-19 declaration  under Section 56 4(b)(1) of the Act, 21 U.S.C. section 360bbb-3(b)(1), unless the authorization is terminated or revoked sooner. Performed at [facility], [place] [address], [place], [place] [address]   MRSA PCR Screening     Status: None   Collection Time: 05/08/19  3:08 AM   Specimen: Nasal Mucosa; Nasopharyngeal  Result Value Ref Range Status   MRSA by  PCR NEGATIVE NEGATIVE Final    Comment:        The GeneXpert MRSA Assay (FDA approved for NASAL specimens only), is one component of a comprehensive MRSA colonization surveillance program. It is not intended to diagnose MRSA infection nor to guide or monitor treatment for MRSA infections. Performed at [facility], [address], [place], [place] [address]   Respiratory Panel by PCR     Status: None   Collection Time: 05/09/19  2:05 PM   Specimen: Nasopharyngeal Swab; Respiratory  Result Value Ref Range Status   Adenovirus NOT DETECTED NOT DETECTED Final   Coronavirus 229E NOT DETECTED NOT DETECTED Final    Comment: (NOTE) The Coronavirus on the Respiratory Panel, DOES NOT test for the novel  Coronavirus (2019 nCoV)    Coronavirus HKU1 NOT DETECTED NOT DETECTED Final   Coronavirus NL63 NOT DETECTED NOT DETECTED Final   Coronavirus OC43 NOT DETECTED NOT DETECTED Final   Metapneumovirus NOT DETECTED NOT DETECTED Final   Rhinovirus / Enterovirus NOT DETECTED NOT DETECTED Final   Influenza A NOT DETECTED NOT DETECTED Final   Influenza B NOT DETECTED NOT DETECTED Final   Parainfluenza Virus 1 NOT DETECTED NOT DETECTED Final   Parainfluenza Virus 2 NOT DETECTED NOT DETECTED Final   Parainfluenza Virus 3 NOT DETECTED NOT DETECTED Final   Parainfluenza Virus 4 NOT DETECTED NOT DETECTED Final   Respiratory Syncytial Virus NOT DETECTED NOT DETECTED Final   Bordetella pertussis NOT DETECTED NOT DETECTED Final   Chlamydophila pneumoniae NOT DETECTED NOT DETECTED Final   Mycoplasma pneumoniae NOT DETECTED NOT DETECTED Final     Comment: Performed at [facility], [place] [address] [place], [place] [address]  Expectorated sputum assessment w rflx to resp cult     Status: None   Collection Time: 05/09/19  5:30 PM   Specimen: Expectorated Sputum  Result Value Ref Range Status   Specimen Description EXPECTORATED SPUTUM  Final   Special Requests Immunocompromised  Final   Sputum evaluation   Final    THIS SPECIMEN IS ACCEPTABLE FOR SPUTUM CULTURE Performed at [facility], [address] [place], [place] [address]    Report Status 05/09/2019 FINAL  Final  Culture, respiratory     Status: None   Collection Time: 05/09/19  5:30 PM  Result Value Ref Range Status   Specimen Description   Final    EXPECTORATED SPUTUM Performed at [facility], [address], [place], [place] [address]    Special Requests   Final    Immunocompromised Reflexed from [id number] Performed at [facility], [address] [place], [place] [address]    Gram Stain   Final    RARE WBC PRESENT, PREDOMINANTLY PMN RARE GRAM POSITIVE COCCI IN PAIRS RARE BUDDING YEAST SEEN FEW SQUAMOUS EPITHELIAL CELLS PRESENT    Culture   Final    FEW Consistent with normal respiratory flora. Performed at [facility], [place] [address] [place], [place] [address]    Report Status 05/12/2019 FINAL  Final     Labs: BNP (last 3 results) Recent Labs    05/05/2019 1720 2019-06-05 0152  BNP 104.0* 009.2*   Basic Metabolic Panel: Recent Labs  Lab 05/11/19 0417 05/12/19 0409 05/14/19 0317 05/14/19 0941 05/14/19 1522 Jun 05, 2019 0152  NA 129* 127* 128* 125*  --  128*  K 4.1 5.2* 6.2* 6.0*  --  5.5*  CL 94* 94* 103 97*  --  97*  CO2 22 24 25  21*  --  23  GLUCOSE 271* 253* 192* 302*  --  204*  BUN 31* 39* 39* 40*  --  37*  CREATININE 0.95 1.04 0.98 0.90  --  0.92  CALCIUM 8.5* 8.1* 8.5* 8.3*  --  8.6*  MG  --   --   --   --  1.9 2.4   Liver Function Tests: Recent Labs  Lab 05/12/19 0409 2019/05/31 0152  AST 20 20  ALT 48* 39  ALKPHOS 115 93   BILITOT 0.7 0.6  PROT 6.0* 6.1*  ALBUMIN 2.5* 2.4*   No results for input(s): LIPASE, AMYLASE in the last 168 hours. No results for input(s): AMMONIA in the last 168 hours. CBC: Recent Labs  Lab 05/11/19 0417 05/12/19 0409 05/13/19 0420 05/14/19 0317 05/31/19 0152  WBC 15.0* 14.5* 9.8 9.2 15.1*  HGB 14.3 13.4 13.3 14.1 15.1  HCT 43.0 40.9 40.9 43.8 45.7  MCV 85.0 84.3 85.6 86.1 85.4  PLT 121* 128* 104* 119* 182   Cardiac Enzymes: No results for input(s): CKTOTAL, CKMB, CKMBINDEX, TROPONINI in the last 168 hours. BNP: Invalid input(s): POCBNP CBG: Recent Labs  Lab 05/14/19 1943 05/31/2019 0024 05/31/2019 0405 May 31, 2019 0739 31-May-2019 1134  GLUCAP 189* 206* 171* 183* 189*   D-Dimer No results for input(s): DDIMER in the last 72 hours. Hgb A1c No results for input(s): HGBA1C in the last 72 hours. Lipid Profile No results for input(s): CHOL, HDL, LDLCALC, TRIG, CHOLHDL, LDLDIRECT in the last 72 hours. Thyroid function studies No results for input(s): TSH, T4TOTAL, T3FREE, THYROIDAB in the last 72 hours.  Invalid input(s): FREET3 Anemia work up No results for input(s): VITAMINB12, FOLATE, FERRITIN, TIBC, IRON, RETICCTPCT in the last 72 hours. Urinalysis    Component Value Date/Time   COLORURINE AMBER (A) 05/28/2017 1246   APPEARANCEUR HAZY (A) 05/28/2017 1246   LABSPEC 1.025 05/28/2017 1246   LABSPEC 1.010 04/03/2017 1237   PHURINE 5.0 05/28/2017 1246   GLUCOSEU NEGATIVE 05/28/2017 1246   GLUCOSEU Negative 04/03/2017 1237   HGBUR LARGE (A) 05/28/2017 1246   BILIRUBINUR NEGATIVE 05/28/2017 1246   BILIRUBINUR Negative 04/03/2017 1237   KETONESUR NEGATIVE 05/28/2017 1246   PROTEINUR NEGATIVE 05/28/2017 1246   UROBILINOGEN 0.2 04/03/2017 1237   NITRITE NEGATIVE 05/28/2017 1246   LEUKOCYTESUR NEGATIVE 05/28/2017 1246   LEUKOCYTESUR Negative 04/03/2017 1237   Sepsis Labs Invalid input(s): PROCALCITONIN,  WBC,  LACTICIDVEN Microbiology Recent Results (from the  past 240 hour(s))  SARS CORONAVIRUS 2 (TAT 6-24 HRS) Nasopharyngeal Nasopharyngeal Swab     Status: None   Collection Time: 05/22/2019  7:36 PM   Specimen: Nasopharyngeal Swab  Result Value Ref Range Status   SARS Coronavirus 2 NEGATIVE NEGATIVE Final    Comment: (NOTE) SARS-CoV-2 target nucleic acids are NOT DETECTED. The SARS-CoV-2 RNA is generally detectable in upper and lower respiratory specimens during the acute phase of infection. Negative results do not preclude SARS-CoV-2 infection, do not rule out co-infections with other pathogens, and should not be used as the sole basis for treatment or other patient management decisions. Negative results must be combined with clinical observations, patient history, and epidemiological information. The expected result is Negative. Fact Sheet for Patients: SugarRoll.be Fact Sheet for Healthcare Providers: https://www.woods-mathews.com/ This test is not yet approved or cleared by the [place] FDA and  has been authorized for detection and/or diagnosis of SARS-CoV-2 by FDA under an Emergency Use Authorization (EUA). This EUA will remain  in effect (meaning this test can be used) for the duration of the  COVID-19 declaration under Section 56 4(b)(1) of the Act, 21 U.S.C. section 360bbb-3(b)(1), unless the authorization is terminated or revoked sooner. Performed at [facility], [place] [address], [place], [place] [address]   MRSA PCR Screening     Status: None   Collection Time: 05/08/19  3:08 AM   Specimen: Nasal Mucosa; Nasopharyngeal  Result Value Ref Range Status   MRSA by PCR NEGATIVE NEGATIVE Final    Comment:        The GeneXpert MRSA Assay (FDA approved for NASAL specimens only), is one component of a comprehensive MRSA colonization surveillance program. It is not intended to diagnose MRSA infection nor to guide or monitor treatment for MRSA infections. Performed at [facility], [address] [place], [place] [address]   Respiratory Panel by PCR     Status: None   Collection Time: 05/09/19  2:05 PM   Specimen: Nasopharyngeal Swab; Respiratory  Result Value Ref Range Status   Adenovirus NOT DETECTED NOT DETECTED Final   Coronavirus 229E NOT DETECTED NOT DETECTED Final    Comment: (NOTE) The Coronavirus on the Respiratory Panel, DOES NOT test for the novel  Coronavirus (2019 nCoV)    Coronavirus HKU1 NOT DETECTED NOT DETECTED Final   Coronavirus NL63 NOT DETECTED NOT DETECTED Final   Coronavirus OC43 NOT DETECTED NOT DETECTED Final   Metapneumovirus NOT DETECTED NOT DETECTED Final   Rhinovirus / Enterovirus NOT DETECTED NOT DETECTED Final   Influenza A NOT DETECTED NOT DETECTED Final   Influenza B NOT DETECTED NOT DETECTED Final   Parainfluenza Virus 1 NOT DETECTED NOT DETECTED Final   Parainfluenza Virus 2 NOT DETECTED NOT DETECTED Final   Parainfluenza Virus 3 NOT DETECTED NOT DETECTED Final   Parainfluenza Virus 4 NOT DETECTED NOT DETECTED Final   Respiratory Syncytial Virus NOT DETECTED NOT DETECTED Final   Bordetella pertussis NOT DETECTED NOT DETECTED Final   Chlamydophila pneumoniae NOT DETECTED NOT DETECTED Final   Mycoplasma pneumoniae NOT DETECTED NOT DETECTED Final    Comment: Performed at [facility], [place] [address] [place], [place] [address]  Expectorated sputum assessment w rflx to resp cult     Status: None   Collection Time: 05/09/19  5:30 PM   Specimen: Expectorated Sputum  Result Value Ref Range Status   Specimen Description EXPECTORATED SPUTUM  Final   Special Requests Immunocompromised  Final   Sputum evaluation   Final    THIS SPECIMEN IS ACCEPTABLE FOR SPUTUM CULTURE Performed at [facility], [address] [place], [place] [address]    Report Status 05/09/2019 FINAL  Final  Culture, respiratory     Status: None   Collection Time: 05/09/19  5:30 PM  Result Value Ref Range Status   Specimen Description   Final     EXPECTORATED SPUTUM Performed at [facility], [address], [place], [place] [address]    Special Requests   Final    Immunocompromised Reflexed from [phone] Performed at [facility], [address], [place], [place] [address]    Gram Stain   Final    RARE WBC PRESENT, PREDOMINANTLY PMN RARE GRAM POSITIVE COCCI IN PAIRS RARE BUDDING YEAST SEEN FEW SQUAMOUS EPITHELIAL CELLS PRESENT    Culture   Final    FEW Consistent with normal respiratory flora. Performed at [facility], [place] [address], [place], [place] [address]    Report Status 05/12/2019 FINAL  Final     Time coordinating discharge: 35 minutes  SIGNED:  Mikiah Demond D Manuella Ghazi, DO [facility] 05/16/2019, 9:06 AM  If 7PM-7AM, please contact night-coverage [email or web address]

## 2019-06-01 NOTE — Progress Notes (Signed)
Aaron Sellers for Heparin Indication: atrial fibrillation  Allergies  Allergen Reactions  . Ciprofloxacin     HIVES  . Adhesive [Tape] Other (See Comments)    Skin peels  . Codeine Other (See Comments)    Jitters, "skin crawling"    Patient Measurements: Height: 6\' 8"  (203.2 cm) Weight: 285 lb 4.4 oz (129.4 kg) IBW/kg (Calculated) : 96 HEPARIN DW (KG): 121  Vital Signs: Temp: 98.6 F (37 C) (02/11 1600) Temp Source: Oral (02/11 1600) BP: 111/87 (02/12 0100) Pulse Rate: 130 (02/12 0100)  Labs: Recent Labs    05/12/19 0409 05/12/19 1326 05/13/19 0420 05/13/19 1611 05/14/19 0317 05/14/19 0941 05/14/19 1522 28-May-2019 0039  HGB 13.4  --  13.3  --  14.1  --   --   --   HCT 40.9  --  40.9  --  43.8  --   --   --   PLT 128*  --  104*  --  119*  --   --   --   HEPARINUNFRC 0.71*   < > 0.72*   < > 1.02*  --  0.44 0.64  CREATININE 1.04  --   --   --  0.98 0.90  --   --    < > = values in this interval not displayed.    Estimated Creatinine Clearance: 124.9 mL/min (by C-G formula based on SCr of 0.9 mg/dL).   Medical History: Past Medical History:  Diagnosis Date  . Arthritis   . Atrial flutter ([place])   . BPH (benign prostatic hypertrophy) with urinary retention   . Cancer ([place])   . Chronic kidney disease    hx of kidney stones  2011  . COPD (chronic obstructive pulmonary disease) ([place])    has been smoking since he was 22 ish--  . High cholesterol   . Hypertension    dx around 2011  . Squamous cell carcinoma of right lung (H[facility])     Medications:  Medications Prior to Admission  Medication Sig Dispense Refill Last Dose  . acetaminophen (TYLENOL) 500 MG tablet Take 1,000 mg by mouth every 6 (six) hours as needed for moderate pain.   unknown at Unknown time  . atorvastatin (LIPITOR) 10 MG tablet Take 10 mg by mouth every evening.    05/06/2019 at Unknown time  . CARTIA XT 180 MG 24 hr capsule Take 1 capsule by mouth once daily  (Patient taking differently: Take 180 mg by mouth daily. ) 90 capsule 0 05/05/2019 at Unknown time  . Cyanocobalamin (VITAMIN B-12 PO) Take 1 tablet by mouth daily.   05/05/2019 at Unknown time  . famotidine (PEPCID) 10 MG tablet Take 10 mg by mouth as needed for heartburn or indigestion.   05/06/2019 at Unknown time  . furosemide (LASIX) 40 MG tablet Take 1 tablet (40 mg total) by mouth daily as needed. 90 tablet 3 Past Week at Unknown time  . gabapentin (NEURONTIN) 300 MG capsule TAKE 1 CAPSULE BY MOUTH THREE TIMES DAILY (Patient taking differently: Take 300 mg by mouth 2 (two) times daily. ) 90 capsule 0 05/25/2019 at Unknown time  . loratadine (CLARITIN) 10 MG tablet Take 10 mg by mouth daily as needed for allergies.    unknown  . tamsulosin (FLOMAX) 0.4 MG CAPS capsule Take 0.4 mg by mouth daily.   05/24/2019 at Unknown time  . terbinafine ([employer] ATHLETES FOOT) 1 % cream Apply 1 application topically daily.   Past Week at  Unknown time  . predniSONE (DELTASONE) 20 MG tablet Take 6 tablets daily for 2 weeks, then take 5 tablets daily for 1 week, then take 4 tablets for 1 week, then take 3 tablets for 1 week, then take 2 tablet for 1 week, then take 1 tablet for 1 week, then take 0.5 tablets for 1 week. 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 D-dimers were elevated, CTA chest with no evidence of PE, but significant for severe interstitial lung disease. Pharmacy asked to anticoagulate with heparin for afib.  2/12 AM update:  Heparin level therapeutic x 2  Goal of Therapy:  Heparin level 0.3-0.7 units/ml Monitor platelets by anticoagulation protocol:  Yes   Plan:  Cont heparin drip at 1700 units/hr Daily CBC/HL Continue to monitor H&H and platelets  Narda Bonds, PharmD, BCPS Clinical Pharmacist Phone: [phone]

## 2019-06-01 NOTE — Progress Notes (Signed)
[facility] Progress Note Patient Name: Aaron Sellers DOB: [birth year] MRN: [id number]   Date of Service  06-09-2019  HPI/Events of Note  AFIB with RVR - Ventricular rate = 130. BP = 111/87.  eICU Interventions  Will order: 1. Restart Cardizem IV infusion. Titrate to HR = 65-105. 2. Mg++ level STAT. 3. Send AM labs now.      Intervention Category Major Interventions: Arrhythmia - evaluation and management  Caree Wolpert Eugene 2019-06-09, 1:13 AM

## 2019-06-01 NOTE — Progress Notes (Signed)
PULMONARY / CRITICAL CARE MEDICINE   NAME:  Aaron Sellers, MRN:  [id number], DOB:  1950/10/19, LOS: 8 ADMISSION DATE:  05/30/2019, CONSULTATION DATE:  2/8 REFERRING MD:  [facility] , CHIEF COMPLAINT:  resp distress   BRIEF HISTORY:    48 yowm with h/o stage IV NSC with mets to R thigh on ketruda  Sob onset 4 d PTA and admit 2/4 with RAF  Then developed  resp distress and required  intubation pm 2/8 and PCCM asked to see.     HISTORY OF PRESENT ILLNESS   Not able to give hx as intubated  Per chart:  69 y.o. 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   In the ED patient was found to be in A. fib with RVR, started on IV Cardizem but gradually worse 1/8 and required ET and PCCM consulted    SIGNIFICANT PAST MEDICAL HISTORY   DIAGNOSIS: Stage IV (T2a,N3,M1c)non-small cell lung cancer, squamous cell carcinoma presented with right upper lobe lung mass in addition to right hilar and mediastinal adenopathy as well as metastatic disease in the medial right thigh diagnosed in November 2018.  PRIOR THERAPY: 1) Palliative radiotherapy to the right lower extremity mass completed April 09, 2017. 2) Systemic chemotherapy with carboplatin for AUC of 5, paclitaxel 175 mg/M2 and Keytruda 200 mg IV every 3 weeks. First dose March 12, 2017, status post 4 cycles with partial response.  CURRENT THERAPY: Maintenance treatment with single agent Keytruda 200 mg IV every 3 weeks. First cycle June 11, 2017. Status post 35cycles completed 03/02/2019  the patient's scan on 02/06/2019  showedinterval increase in size and number of diffuse bilateral peribronchovascular groundglass nodularity suspicious for atypical  pneumonia/metastatic disease but also could be immunotherapy mediated inflammatory process. He was started on doxycycline and prednisone. Hecompletedthe course of antibiotics.He completed his prednisone taper   SIGNIFICANT EVENTS:  Admit 05/14/2019 Intubate 05/11/19   Infliximab 2/7 >>>    STUDIES:   CTa 2/5  1. Marked severity diffuse bilateral ground-glass appearing infiltrates. 2. No evidence of pulmonary embolism. Echo 2/10 >>>  EF 45% mild LVH      CULTURES/ID studies:  Sputum 2/6 >>> nl flora  Resp PCR panel 2/6  > neg  Covid PCR  2/4 neg  MRSA  PCR 2/5 neg  ET fungus 2/10 not sent ET PCP  2/10 not sent  Fungitell  2/6  = 188     ANTIBIOTICS:  Vanc 2/5-2/6 Zmax  2/5 >>> 2/11 maxepime 2/5 >>> 2/12  Bactrim 2/6 >>>  Vorconizole 2/11   LINES/TUBES:  PIC RUE  2/6 >>> Oral ET 2/8  >>> [place] 2/12 >>>  CONSULTANTS:  Cards 2/5  Onc 2/9   Scheduled Meds: . atorvastatin  10 mg Oral QPM  . bisacodyl  10 mg Rectal BID  . chlorhexidine gluconate (MEDLINE KIT)  15 mL Mouth Rinse BID  . Chlorhexidine Gluconate Cloth  6 each Topical Daily  . digoxin  0.125 mg Intravenous Daily  . feeding supplement (PRO-STAT SUGAR FREE 64)  30 mL Oral TID  . gabapentin  300 mg Oral BID  . hydrocortisone sodium succinate  100 mg Intravenous Q12H  . insulin aspart  0-5 Units Subcutaneous QHS  . insulin aspart  0-9  Units Subcutaneous TID WC  . LORazepam  1 mg Intravenous Q4H  . mouth rinse  15 mL Mouth Rinse 10 times per day  . metoCLOPramide (REGLAN) injection  5 mg Intravenous Q6H  . metoprolol tartrate  5 mg Intravenous Q8H  . pantoprazole (PROTONIX) IV  40 mg Intravenous Q24H  . sodium chloride flush  10 mL Intravenous Q12H  . sodium chloride flush  3 mL Intravenous Q12H   Continuous Infusions: . sodium chloride    . sodium chloride 75 mL/hr at June 09, 2019 1129  . azithromycin Stopped (05/14/19 1609)  . ceFEPime (MAXIPIME) IV Stopped (June 09, 2019 0954)  . diltiazem  (CARDIZEM) infusion 12.5 mg/hr (Jun 09, 2019 1129)  . fentaNYL 10 mcg/ml infusion 20 mcg/hr (06/09/2019 1129)  . sulfamethoxazole-trimethoprim Stopped (09-Jun-2019 1059)  . voriconazole     PRN Meds:.sodium chloride, acetaminophen, fentaNYL (SUBLIMAZE) injection, LORazepam, ondansetron **OR** ondansetron (ZOFRAN) IV, oxymetazoline, sodium chloride, sodium chloride flush, sodium chloride flush   SUBJECTIVE/pvernight:  New nose bleed/ back in RAF > cardizem drip restarted  CONSTITUTIONAL: BP 115/85 (BP Location: Left Arm)   Pulse (!) 135   Temp 98.2 F (36.8 C) (Axillary)   Resp (!) 22   Ht '6\' 8"'$  (2.032 m)   Wt 130.4 kg   SpO2 93%   BMI 31.58 kg/m   I/O last 3 completed shifts: In: 6642.3 [I.V.:2156; NG/GT:1062.8; IV Piggyback:3423.6] Out: 3950 [Urine:3950]    Intake/Output Summary (Last 24 hours) at 09-Jun-2019 1201 Last data filed at 06-09-19 1129 Gross per 24 hour  Intake 6193.21 ml  Output 1550 ml  Net 4643.21 ml     CVP:  [5 mmHg-13 mmHg] 10 mmHg    Vent Mode: PRVC FiO2 (%):  [40 %-50 %] 50 % Set Rate:  [26 bmp] 26 bmp Vt Set:  [650 mL] 650 mL PEEP:  [8 cmH20] 8 cmH20 Plateau Pressure:  [16 cmH20-39 cmH20] 38 cmH20  PHYSICAL EXAM:  obese wm Et in place with obvious air leak No jvd Oropharynet in place/ rhinorocket in place  Neck supple Lungs with a scattered exp > insp rhonchi bilaterally IRIR no s3 or or sign murmur fib vs flutter  Abd obese decreased bowel sounds  Extr warm with 1+ pitting  edema or clubbing noted        RESOLVED PROBLEM LIST   ASSESSMENT AND PLAN    1) Acute on chronic respiratory failure now vent dp with diffuse as dz ? Etiology with PCT not suggestive of systemic bacterial infection but can't by itself r/o pna  - off ketruda since 03/02/2019 and on variable pred rx since then - on Infliximab 2/7  - off amiodarone 2/8 Lab Results  Component Value Date   ESRSEDRATE 18 (H) 2019/06/09   ESRSEDRATE 30 (H) 05/11/2019   rec: continue high  dose steroids for now Very unlikely FOB will yield specific dx that would change course here and risks making things worse than they already are so rec 1) await BAL done  per RT for PCP, fungus,  2) ARDS protocol for Vt, fio2/peep  3) keep sedated with addition of clonazepam 2/11  and off versed as risked  Tachyphylaxis     2) stage IV sq cell ca s airlway involvement  - holding further rx    3) BP still soft but maint off pressors  - check cvp/ repeat bnp in am   4) Thrombocytopenia  on heparin= thrombocytenia/ acute, not present on admit. Lab Results  Component Value Date   PLT 182 06-09-2019  PLT 119 (L) 05/14/2019   PLT 104 (L) 05/13/2019   PLT 207 03/30/2019   PLT 169 03/02/2019   PLT 185 02/10/2019   PLT 261 04/03/2017   PLT 171 03/27/2017   PLT 177 03/19/2017   Resolved   5) Mild hyperkalemia with mild hyponatremia despite good uop /on solumderol/ creat ? Etiology  rx per cards already   >>> changed to  Hydrocortisone  2/11   SUMMARY OF TODAY'S PLAN:   fm has decided to transition to comfort care so rec sedate / wean and extubate when comfort goals met       LABS  Glucose Recent Labs  Lab 05/14/19 1619 05/14/19 1943 2019/05/28 0024 05-28-19 0405 05-28-2019 0739 05-28-2019 1134  GLUCAP 214* 189* 206* 171* 183* 189*    BMET Recent Labs  Lab 05/14/19 0317 05/14/19 0941 2019-05-28 0152  NA 128* 125* 128*  K 6.2* 6.0* 5.5*  CL 103 97* 97*  CO2 25 21* 23  BUN 39* 40* 37*  CREATININE 0.98 0.90 0.92  GLUCOSE 192* 302* 204*    Liver Enzymes Recent Labs  Lab 05/12/19 0409 05-28-2019 0152  AST 20 20  ALT 48* 39  ALKPHOS 115 93  BILITOT 0.7 0.6  ALBUMIN 2.5* 2.4*    Electrolytes Recent Labs  Lab 05/14/19 0317 05/14/19 0941 05/14/19 1522 May 28, 2019 0152  CALCIUM 8.5* 8.3*  --  8.6*  MG  --   --  1.9 2.4    CBC Recent Labs  Lab 05/13/19 0420 05/14/19 0317 May 28, 2019 0152  WBC 9.8 9.2 15.1*  HGB 13.3 14.1 15.1  HCT 40.9 43.8 45.7  PLT 104*  119* 182    ABG Recent Labs  Lab 05/11/19 0906 05/11/19 1546  PHART 7.425 7.320*  PCO2ART 37.3 47.6  PO2ART 92.9 216*    Coag's No results for input(s): APTT, INR in the last 168 hours.  Sepsis Markers Recent Labs  Lab 05/09/19 0439 05/10/19 0454  PROCALCITON 0.22 0.24    Cardiac Enzymes No results for input(s): TROPONINI, PROBNP in the last 168 hours.      Christinia Gully, MD Pulmonary and [place] [phone] After 5:30 PM or weekends, use [facility] [phone]

## 2019-06-01 NOTE — Procedures (Signed)
**Note De-Identified Quantavious Eggert Obfuscation** Extubation Procedure Note  Patient Details:   Name: Aaron Sellers DOB: 09-25-1950 MRN: [id number]   Airway Documentation:    Vent end date: 2019/06/03 Vent end time: 1700   Evaluation  O2 sats: transiently fell during during procedure Complications: No apparent complications Patient did not tolerate procedure well. Bilateral Breath Sounds: Diminished   No  Tylor Courtwright, Penni Bombard 2019-06-03, 5:11 PM

## 2019-06-01 NOTE — Progress Notes (Signed)
This RN wasted 2569mcg of Fentanyl in the stericycle bin with Loni Muse, RN as witness.

## 2019-06-01 NOTE — Progress Notes (Signed)
Progress Note  Patient Name: Aaron Sellers Date of Encounter: 05/16/19  Primary Cardiologist: Carlyle Dolly, MD   Subjective   Recurrent aflutter with RVR  Inpatient Medications    Scheduled Meds: . atorvastatin  10 mg Oral QPM  . bisacodyl  10 mg Rectal BID  . chlorhexidine gluconate (MEDLINE KIT)  15 mL Mouth Rinse BID  . Chlorhexidine Gluconate Cloth  6 each Topical Daily  . digoxin  0.125 mg Intravenous Daily  . feeding supplement (PRO-STAT SUGAR FREE 64)  30 mL Oral TID  . gabapentin  300 mg Oral BID  . hydrocortisone sodium succinate  100 mg Intravenous Q12H  . insulin aspart  0-5 Units Subcutaneous QHS  . insulin aspart  0-9 Units Subcutaneous TID WC  . LORazepam  1 mg Intravenous Q4H  . mouth rinse  15 mL Mouth Rinse 10 times per day  . methylPREDNISolone (SOLU-MEDROL) injection  80 mg Intravenous Q12H  . metoCLOPramide (REGLAN) injection  5 mg Intravenous Q6H  . metoprolol tartrate  2.5 mg Intravenous Q8H  . pantoprazole (PROTONIX) IV  40 mg Intravenous Q24H  . sodium chloride flush  10 mL Intravenous Q12H  . sodium chloride flush  3 mL Intravenous Q12H   Continuous Infusions: . sodium chloride    . sodium chloride 75 mL/hr at 2019-05-16 0810  . azithromycin Stopped (05/14/19 1609)  . ceFEPime (MAXIPIME) IV Stopped (05-16-2019 0206)  . diltiazem (CARDIZEM) infusion 12.5 mg/hr (2019-05-16 0810)  . fentaNYL 10 mcg/ml infusion 20 mcg/hr (2019/05/16 0810)  . heparin 1,700 Units/hr (May 16, 2019 0810)  . sulfamethoxazole-trimethoprim Stopped (05/16/19 0531)  . voriconazole     PRN Meds: sodium chloride, acetaminophen, fentaNYL (SUBLIMAZE) injection, LORazepam, ondansetron **OR** ondansetron (ZOFRAN) IV, oxymetazoline, sodium chloride, sodium chloride flush, sodium chloride flush   Vital Signs    Vitals:   05-16-2019 0600 2019/05/16 0700 05/16/19 0745 2019/05/16 0809  BP: 126/88 119/78 115/85   Pulse: (!) 129 (!) 135    Resp: 19 (!) 22    Temp:      TempSrc:      SpO2:    92% 93%  Weight:      Height:        Intake/Output Summary (Last 24 hours) at May 16, 2019 0818 Last data filed at 2019-05-16 0810 Gross per 24 hour  Intake 5776.17 ml  Output 1550 ml  Net 4226.17 ml   Last 3 Weights 16-May-2019 05/14/2019 05/13/2019  Weight (lbs) 287 lb 7.7 oz 285 lb 4.4 oz 281 lb 12 oz  Weight (kg) 130.4 kg 129.4 kg 127.8 kg      Telemetry    Aflutter RVR - Personally Reviewed  ECG    n/a - Personally Reviewed  Physical Exam   GEN: No acute distress.   Neck: No JVD Cardiac: regular and tachy, no murmurs, rubs, or gallops.  Respiratory: coarse bilaterally GI: Soft, nontender, non-distended  MS: 1-2+ bilateral LE edema; No deformity. Neuro:  Nonfocal  Psych: intubated/sedated  Labs    High Sensitivity Troponin:  No results for input(s): TROPONINIHS in the last 720 hours.    Chemistry Recent Labs  Lab 05/12/19 0409 05/12/19 0409 05/14/19 0317 05/14/19 0941 May 16, 2019 0152  NA 127*   < > 128* 125* 128*  K 5.2*   < > 6.2* 6.0* 5.5*  CL 94*   < > 103 97* 97*  CO2 24   < > 25 21* 23  GLUCOSE 253*   < > 192* 302* 204*  BUN 39*   < >  39* 40* 37*  CREATININE 1.04   < > 0.98 0.90 0.92  CALCIUM 8.1*   < > 8.5* 8.3* 8.6*  PROT 6.0*  --   --   --  6.1*  ALBUMIN 2.5*  --   --   --  2.4*  AST 20  --   --   --  20  ALT 48*  --   --   --  39  ALKPHOS 115  --   --   --  93  BILITOT 0.7  --   --   --  0.6  GFRNONAA >60   < > >60 >60 >60  GFRAA >60   < > >60 >60 >60  ANIONGAP 9  --   --  7 8   < > = values in this interval not displayed.     Hematology Recent Labs  Lab 05/13/19 0420 05/14/19 0317 25-May-2019 0152  WBC 9.8 9.2 15.1*  RBC 4.78 5.09 5.35  HGB 13.3 14.1 15.1  HCT 40.9 43.8 45.7  MCV 85.6 86.1 85.4  MCH 27.8 27.7 28.2  MCHC 32.5 32.2 33.0  RDW 13.2 13.3 13.5  PLT 104* 119* 182    BNP Recent Labs  Lab 05-25-19 0152  BNP 135.0*     DDimer  Recent Labs  Lab 05/08/19 1159  DDIMER 2.11*     Radiology    DG Abd 1  View  Result Date: 05/14/2019 CLINICAL DATA:  Abdominal distension. EXAM: ABDOMEN - 1 VIEW COMPARISON:  May 13, 2019. FINDINGS: Mildly dilated small bowel loops are noted concerning for ileus or possibly distal small bowel obstruction. Nasogastric tube tip is seen in proximal stomach. No colonic dilatation is noted. No radio-opaque calculi or other significant radiographic abnormality are seen. IMPRESSION: Nasogastric tube tip seen in proximal stomach. Mildly dilated small bowel loops are noted concerning for ileus or possibly distal small bowel obstruction. Electronically Signed   By: Marijo Conception M.D.   On: 05/14/2019 14:45   DG Abd 1 View  Result Date: 05/13/2019 CLINICAL DATA:  OG tube placement EXAM: ABDOMEN - 1 VIEW COMPARISON:  05/13/2019 FINDINGS: OG tube tip is in the stomach. Dilated small bowel loops concerning for small bowel obstruction. Gas also seen within nondistended colon. IMPRESSION: NG tube tip in the stomach. Concern for small bowel obstruction. Electronically Signed   By: Rolm Baptise M.D.   On: 05/13/2019 18:10   DG Abd 1 View  Result Date: 05/13/2019 CLINICAL DATA:  Orogastric tube placement. EXAM: ABDOMEN - 1 VIEW COMPARISON:  May 11, 2019. FINDINGS: The bowel gas pattern is normal. Distal tip of enteric tube is seen in proximal stomach. No radio-opaque calculi or other significant radiographic abnormality are seen. IMPRESSION: Distal tip of enteric tube seen in proximal stomach. Electronically Signed   By: Marijo Conception M.D.   On: 05/13/2019 11:31   DG Chest Port 1 View  Result Date: 2019-05-25 CLINICAL DATA:  ICU, intubated, acute respiratory failure with hypoxemia EXAM: PORTABLE CHEST 1 VIEW COMPARISON:  Radiograph 05/12/2019 FINDINGS: *Endotracheal tube terminates in the mid trachea 5 cm from the carina *Transesophageal tube tip and side port distal to the GE junction, terminating within the air distended gastric lumen. *Right upper extremity PICC terminates  in the lower SVC. *Telemetry leads overlie the chest. Extensive bilateral pulmonary opacities are similar to prior counting for differences in technique. No pneumothorax or effusion. Cardiomediastinal contours are unchanged. No acute osseous or soft tissue abnormality. IMPRESSION: Lines and  tubes as above. Persistent bilateral airspace disease. No significant interval change. Electronically Signed   By: Lovena Le M.D.   On: 2019-06-13 05:54   ECHOCARDIOGRAM COMPLETE  Result Date: 05/13/2019    ECHOCARDIOGRAM REPORT   Patient Name:   DEVONTRE SIEDSCHLAG Date of Exam: 05/13/2019 Medical Rec #:  [id number]    Height:       80.0 in Accession #:    [id number]   Weight:       281.7 lb Date of Birth:  [birth year]    BSA:          2.66 m Patient Age:    6 years     BP:           107/71 mmHg Patient Gender: M            HR:           82 bpm. Exam Location:  [facility] Procedure: 2D Echo Indications:    Atrial Fibrillation 427.31 / I48.91  History:        Patient has prior history of Echocardiogram examinations, most                 recent 06/01/2017. COPD, Arrythmias:Atrial Fibrillation and Atrial                 Flutter; Risk Factors:Former Smoker, Dyslipidemia and                 Hypertension. Lung mass,Primary malignant neoplasm of right lung                 metastatic to other site.  Sonographer:    Leavy Cella RDCS (AE) Referring Phys: [id number] [place]  1. Left ventricular ejection fraction, by estimation, is 45%. The left ventricle has mildly decreased function. The left ventrical has no regional wall motion abnormalities. There is mildly increased left ventricular hypertrophy. Left ventricular diastolic parameters are indeterminate.  2. Right ventricular systolic function is normal. The right ventricular size is normal. There is mildly elevated pulmonary artery systolic pressure.  3. The mitral valve is normal in structure and function. trivial mitral valve regurgitation. No evidence of mitral  stenosis.  4. The aortic valve is tricuspid. Aortic valve regurgitation is not visualized. No aortic stenosis is present. FINDINGS  Left Ventricle: Left ventricular ejection fraction, by estimation, is 45%. The left ventricle has mildly decreased function. The left ventricle has no regional wall motion abnormalities. There is mildly increased left ventricular hypertrophy. Left ventricular diastolic parameters are indeterminate. Right Ventricle: The right ventricular size is normal. No increase in right ventricular wall thickness. Right ventricular systolic function is normal. There is mildly elevated pulmonary artery systolic pressure. The tricuspid regurgitant velocity is 2.52  m/s, and with an assumed right atrial pressure of 10 mmHg, the estimated right ventricular systolic pressure is 08.1 mmHg. Left Atrium: Left atrial size was normal in size. Right Atrium: Right atrial size was normal in size. Pericardium: There is no evidence of pericardial effusion. Mitral Valve: The mitral valve is normal in structure and function. Trivial mitral valve regurgitation. No evidence of mitral valve stenosis. Tricuspid Valve: The tricuspid valve is normal in structure. Tricuspid valve regurgitation is mild . No evidence of tricuspid stenosis. Aortic Valve: The aortic valve is tricuspid. Aortic valve regurgitation is not visualized. No aortic stenosis is present. Aortic valve mean gradient measures 4.9 mmHg. Aortic valve peak gradient measures 8.7 mmHg. Aortic valve area, by VTI measures 1.74 cm. Pulmonic Valve:  The pulmonic valve was not well visualized. Pulmonic valve regurgitation is not visualized. No evidence of pulmonic stenosis. Aorta: The aortic root is normal in size and structure. Pulmonary Artery: Indeterminate PASP, inadequate TR jet. Venous: The inferior vena cava was not well visualized. IAS/Shunts: No atrial level shunt detected by color flow Doppler.  LEFT VENTRICLE PLAX 2D LVIDd:         4.86 cm  Diastology  LVIDs:         4.01 cm  LV e' lateral:   9.03 cm/s LV PW:         1.34 cm  LV E/e' lateral: 9.9 LV IVS:        1.15 cm  LV e' medial:    6.96 cm/s LVOT diam:     2.10 cm  LV E/e' medial:  12.9 LV SV:         47.58 ml LV SV Index:   14.87 LVOT Area:     3.46 cm  RIGHT VENTRICLE RV S prime:     11.10 cm/s TAPSE (M-mode): 1.8 cm LEFT ATRIUM             Index       RIGHT ATRIUM           Index LA diam:        2.90 cm 1.09 cm/m  RA Area:     19.00 cm LA Vol (A2C):   68.4 ml 25.71 ml/m RA Volume:   51.10 ml  19.21 ml/m LA Vol (A4C):   53.2 ml 20.00 ml/m LA Biplane Vol: 65.1 ml 24.47 ml/m  AORTIC VALVE AV Area (Vmax):    1.82 cm AV Area (Vmean):   1.65 cm AV Area (VTI):     1.74 cm AV Vmax:           147.80 cm/s AV Vmean:          106.122 cm/s AV VTI:            0.273 m AV Peak Grad:      8.7 mmHg AV Mean Grad:      4.9 mmHg LVOT Vmax:         77.61 cm/s LVOT Vmean:        50.669 cm/s LVOT VTI:          0.137 m LVOT/AV VTI ratio: 0.50  AORTA Ao Root diam: 3.40 cm MITRAL VALVE                        TRICUSPID VALVE MV Area (PHT): 4.80 cm             TR Peak grad:   25.4 mmHg MV Decel Time: 158 msec             TR Vmax:        252.00 cm/s MV E velocity: 89.70 cm/s 103 cm/s MV A velocity: 30.60 cm/s 70.3 cm/s SHUNTS MV E/A ratio:  2.93       1.5       Systemic VTI:  0.14 m                                     Systemic Diam: 2.10 cm Carlyle Dolly MD Electronically signed by Carlyle Dolly MD Signature Date/Time: 05/13/2019/3:45:36 PM    Final     Cardiac Studies     Patient Profile  69 y.o.malewith a history of hyperlipidemia, stage IV lung cancer, COPD, atrial fibrillation (previously not anticoagulated with CHA2DS2-VASc or of 1, lung cancer, and history of bowel perforation).   Assessment & Plan    1. Afib/aflutter with RVR - rate controlled affected by soft bp's. Avoiding amio due to his acute lung issues - had done well for last few days on IV digoxin and IV lopressor - recurrent aflutter  with RVR last night, restarted on dilt gtt.  - CHA2DS2-VASc of 1, lung cancer, and history of bowel perforation)had not been on home anticoag but started on hep here in case cardioversion required.    - may consider TEE/DCCV at some point if remains in flutter and depending on families goals of care. TOday and over the weekend would focus on rate control. From palliative note family meeting today to assess goals of care.  - currently on dilt gtt 12.5, digoxin 0.164m lopressor 2.5 every 8 hrs. Increase lopresor to '5mg'$  every 8 hrs - heavy nosebleeds, would d/c heparin. STart DVT prophylaxis [place] [facility]   2. Acute hypoxic resp failure - diffuse pulm infiltrates on presentation - had been on bipap,later intubated - on emperic broad spectrum abx - COVID 2 negative. 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 Hypotension - intermittently on pressors - echo with LVE 45%, normal RV function. No cardiac etiology of his hypotension  5. Mild systolic dysfunction - LVE 45% by echo, mild decrease from 05/2017 study. THis study done in setting of aflutter, repeat study once back in and maintaing SR. -CVP 10 reasonable for ventilated patient, justly slightlyt elevated BNP despite some LE edema. DOn't see strong indication for diuretic today.   6. Hyperkalemia - mildly elevated today, improved from yesterday - continue to monitor   7. Ileus  For questions or updates, please contact [place] consult [email or web address] for contact info under        Signed, BCarlyle Dolly MD  202/25/21 8:18 AM

## 2019-06-01 NOTE — Progress Notes (Signed)
New Fentanyl drip brought down by pharmacy, however family elected to proceed with comfort and patient will be started on a morphine drip instead. Informed pharmacy and instructed by pharmacist to waste Fentanyl drip. 250 mL wasted into stericycle with Sherry Ruffing, RN as second witness.

## 2019-06-01 NOTE — Progress Notes (Signed)
Nutrition Follow-up  DOCUMENTATION CODES:   Not applicable  INTERVENTION:  Monitor for resolving ileus, resume tube feedings as medically feasible pending goals of care  Vital High Protein @ 20 ml/hr, advance as tolerated 15 ml every 12 hrs to goal rate 55 ml/hr with Pro-stat TID via tube  Provides 1920 kcal, 206 grams of protein, and 1109 ml free water  NUTRITION DIAGNOSIS:   Inadequate oral intake related to inability to eat as evidenced by NPO status.  Ongoing  GOAL:   Provide needs based on ASPEN/SCCM guidelines  Progressing  MONITOR:   Vent status, Weight trends, Labs, I & O's  REASON FOR ASSESSMENT:   Ventilator    ASSESSMENT:  RD working remotely.  69 year old male with past medical history of paroxysmal atrial flutter, COPD, CKD, HTN, HLD, h/o perforated bowel, stage IV squamous cell carcinoma of the right lunch s/p palliative radiotherapy, systemic chemotherapy completed 2019, presented to ED with complaints of dyspnea over the past 4 days. Patient found to with atrial fibrillation with RVR and CTA chest significant for severe interstitial lung disease.  2/4 Admit 2/7 BiPAP 2/8 Intubated 2/10 TF initiated  2/11 - worsening abdominal distention SBO/ileus confirmed on imaging and tube feeds discontinued. Patient started on Dulcolax suppositories and reglan, OG tube to low intermittent suction.  Per notes, pt with poor prognosis. Palliative Care to meet with family today for goals of care.  Admit wt 123.3 kg Current wt 130.4 kg  Noted BUE non-pitting edema, BLE mild pitting edema  I/Os: +3027 ml x 24 hrs UOP: 1550 ml x 24 hrs  Patient is currently intubated and sedated on ventilator support MV: 20 L/min Temp (24hrs), Avg:98.2 F (36.8 C), Min:97.8 F (36.6 C), Max:98.6 F (37 C)  Not on Propofol  Medications reviewed and include: Dulcolax, SS novolog, Reglan, Protonix Drips: NaCl Zithromax Maxipime Cardizem Fentanyl 20 mcg Bactrim  Labs:  CBGs 183, 171, 206, 189, 214 x 24 hrs, Na 128 (L), K 5.5 (H), WBC 15.1 (H), BNP 135 (H)  Diet Order:   Diet Order            Diet NPO time specified  Diet effective now              EDUCATION NEEDS:   No education needs have been identified at this time  Skin:  Skin Assessment: Reviewed RN Assessment  Last BM:  2/5  Height:   Ht Readings from Last 1 Encounters:  05/11/19 6\' 8"  (2.032 m)    Weight:   Wt Readings from Last 1 Encounters:  24-May-2019 130.4 kg    Ideal Body Weight:  102.7 kg  BMI:  Body mass index is 31.58 kg/m.  Estimated Nutritional Needs:   Kcal:  8366-2947  Protein:  205  Fluid:  >/= 1.5 L/day   Lajuan Lines, RD, LDN Clinical Nutrition [place] Telephone [phone] After Hours/Weekend Pager: [phone]

## 2019-06-01 NOTE — Progress Notes (Signed)
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 Patient states that he has been having shortness of breath with palpitations and at times was lightheaded. Denies any chest pain. He was found to be in A. fib with RVR, admitted to stepdown for heart rate control on Cardizem drip, as well patient had progressive hypoxia within first 24 hours on admission, which has escalated rapidly, he D-dimers were elevated, CTA chest with no evidence of PE, but significant for severe interstitial lung disease, high clinical suspicion for Keytruda pneumonitis, received infliximab 2/7 patient with increased work of breathing on BiPAP, more lethargic, required intubationon2/8.  2/10:Plan to continue current antibiotics as well as high-dose steroids. Diltiazem being weaned by cardiology with use of digoxin as well as Lopressor. 2D echocardiogram obtained and pending. Pressors have been weaned off. Continue on heparin drip in case cardioversion is required and monitor platelet counts closely.  2/11: Continue current antibiotics and high-dose steroids.  Appreciate cardiology recommendations with IV Lopressor and digoxin.  Continue heparin drip.  Correct hyperkalemia with Kayexalate and monitor closely.  He is having worsening abdominal distention with ileus/SBO confirmed on imaging.  We will keep on low intermittent suction in order Dulcolax suppositories.  2/12: Patient continued to have persistent epistaxis for which Rhino Rocket was placed by EDP.  Shortly thereafter, he began to have  some dislodgment of his endotracheal tube.  Palliative discussions with family members outlined multiple comorbidities at high risk for sudden death in family members did not want to pursue any reintubation attempts.  He has now been transitioned to morphine drip with plans to extubate when patient is comfortable and family is ready.  Assessment & Plan:   Active Problems:   Atrial fibrillation with rapid ventricular response (HCC)   Primary malignant neoplasm of right lung metastatic to other site [facility])   Pneumonitis   Goals of care, counseling/discussion   Advanced care planning/counseling discussion   Palliative care by specialist   Acute respiratory distress syndrome (ARDS) (HCC)   Thrombocytopenia (HCC)   Oxygen desaturation   Acute respiratory failure with hypoxemia (HCC)   Abdominal distention   Comfort measures only status   Terminal care   Acute hypoxemic respiratory failure-multifactorial with high suspicion of Keytruda pneumonitis as well as invasive fungal infection  Atrial fibrillation with RVR  Hyperkalemia/hyponatremia  Worsening ileus  Stage IV squamous cell carcinoma  Acute thrombocytopenia-improved  Maintain on comfort care with plans for extubation soon.   DVT prophylaxis: None Code Status: DNR/comfort care Family Communication: Discussions with palliative care.  I discussed with wife earlier today as well. Disposition Plan: Comfort care with compassionate extubation planned soon.  Consultants:  Cardiology  PCCM  Oncology  Palliative care  Procedures:  2/6 PICC line  2/6 ET tube  Antimicrobials:  Anti-infectives (From admission, onward)   Start     Dose/Rate Route Frequency Ordered Stop   05/14/19 2000  voriconazole (VFEND) 780 mg in sodium chloride 0.9 % 150 mL IVPB  Status:  Discontinued     6 mg/kg  129.4 kg 114 mL/hr over 120 Minutes Intravenous Every 12 hours 05/14/19 1955 May 31, 2019 1238  05/09/19 1500   sulfamethoxazole-trimethoprim (BACTRIM) 400.8 mg in dextrose 5 % 500 mL IVPB  Status:  Discontinued     15 mg/kg/day  106.9 kg (Adjusted) 350 mL/hr over 90 Minutes Intravenous Every 6 hours 05/09/19 1428 2019/05/29 1238   05/09/19 0800  vancomycin (VANCOREADY) IVPB 1250 mg/250 mL  Status:  Discontinued     1,250 mg 166.7 mL/hr over 90 Minutes Intravenous Every 12 hours 05/08/19 1725 05/09/19 1412   05/08/19 1900  vancomycin (VANCOREADY) IVPB 2000 mg/400 mL     2,000 mg 200 mL/hr over 120 Minutes Intravenous  Once 05/08/19 1720 05/08/19 2145   05/08/19 1800  ceFEPIme (MAXIPIME) 2 g in sodium chloride 0.9 % 100 mL IVPB  Status:  Discontinued     2 g 200 mL/hr over 30 Minutes Intravenous Every 8 hours 05/08/19 1719 May 29, 2019 1238   05/08/19 1445  azithromycin (ZITHROMAX) 500 mg in sodium chloride 0.9 % 250 mL IVPB  Status:  Discontinued     500 mg 250 mL/hr over 60 Minutes Intravenous Every 24 hours 05/08/19 1440 May 29, 2019 1238   05/08/19 1445  cefTRIAXone (ROCEPHIN) 2 g in sodium chloride 0.9 % 100 mL IVPB  Status:  Discontinued     2 g 200 mL/hr over 30 Minutes Intravenous Every 24 hours 05/08/19 1440 05/08/19 1719       Subjective: Patient seen and evaluated today and he appears sedated on the ventilator.  He has periods of significant agitation.  Objective: Vitals:   2019/05/29 0745 2019-05-29 0809 05-29-2019 1136 05-29-2019 1221  BP: 115/85     Pulse:      Resp:      Temp:   98.2 F (36.8 C)   TempSrc:   Axillary   SpO2: 92% 93%  90%  Weight:      Height:        Intake/Output Summary (Last 24 hours) at 05/29/19 1536 Last data filed at 05/29/19 1443 Gross per 24 hour  Intake 4605.42 ml  Output 1550 ml  Net 3055.42 ml   Filed Weights   05/13/19 0545 05/14/19 0500 2019-05-29 0500  Weight: 127.8 kg 129.4 kg 130.4 kg    Examination:  General exam: Appears sedated Respiratory system: Intubated on mechanical support. Cardiovascular system: S1 & S2 heard, RRR. No JVD, murmurs,  rubs, gallops or clicks. No pedal edema. Gastrointestinal system: Abdomen is nondistended, soft and nontender. No organomegaly or masses felt. Normal bowel sounds heard. Central nervous system: Sedated Extremities: No edema Skin: No rashes, lesions or ulcers Psychiatry: Cannot be assessed    Data Reviewed: I have personally reviewed following labs and imaging studies  CBC: Recent Labs  Lab 05/11/19 0417 05/12/19 0409 05/13/19 0420 05/14/19 0317 May 29, 2019 0152  WBC 15.0* 14.5* 9.8 9.2 15.1*  HGB 14.3 13.4 13.3 14.1 15.1  HCT 43.0 40.9 40.9 43.8 45.7  MCV 85.0 84.3 85.6 86.1 85.4  PLT 121* 128* 104* 119* 093   Basic Metabolic Panel: Recent Labs  Lab 05/11/19 0417 05/12/19 0409 05/14/19 0317 05/14/19 0941 05/14/19 1522 05/29/2019 0152  NA 129* 127* 128* 125*  --  128*  K 4.1 5.2* 6.2* 6.0*  --  5.5*  CL 94* 94* 103 97*  --  97*  CO2 22 24 25  21*  --  23  GLUCOSE 271* 253* 192* 302*  --  204*  BUN 31* 39* 39* 40*  --  37*  CREATININE 0.95 1.04 0.98 0.90  --  0.92  CALCIUM 8.5* 8.1* 8.5* 8.3*  --  8.6*  MG  --   --   --   --  1.9 2.4   GFR: Estimated Creatinine Clearance: 122.7 mL/min (by C-G formula based on SCr of 0.92 mg/dL). Liver Function Tests: Recent Labs  Lab 05/12/19 0409 06-07-19 0152  AST 20 20  ALT 48* 39  ALKPHOS 115 93  BILITOT 0.7 0.6  PROT 6.0* 6.1*  ALBUMIN 2.5* 2.4*   No results for input(s): LIPASE, AMYLASE in the last 168 hours. No results for input(s): AMMONIA in the last 168 hours. Coagulation Profile: No results for input(s): INR, PROTIME in the last 168 hours. Cardiac Enzymes: No results for input(s): CKTOTAL, CKMB, CKMBINDEX, TROPONINI in the last 168 hours. BNP (last 3 results) No results for input(s): PROBNP in the last 8760 hours. HbA1C: No results for input(s): HGBA1C in the last 72 hours. CBG: Recent Labs  Lab 05/14/19 1943 06-07-2019 0024 2019/06/07 0405 2019/06/07 0739 06/07/2019 1134  GLUCAP 189* 206* 171* 183* 189*    Lipid Profile: No results for input(s): CHOL, HDL, LDLCALC, TRIG, CHOLHDL, LDLDIRECT in the last 72 hours. Thyroid Function Tests: No results for input(s): TSH, T4TOTAL, FREET4, T3FREE, THYROIDAB in the last 72 hours. Anemia Panel: No results for input(s): VITAMINB12, FOLATE, FERRITIN, TIBC, IRON, RETICCTPCT in the last 72 hours. Sepsis Labs: Recent Labs  Lab 05/09/19 0439 05/10/19 0454  PROCALCITON 0.22 0.24    Recent Results (from the past 240 hour(s))  SARS CORONAVIRUS 2 (TAT 6-24 HRS) Nasopharyngeal Nasopharyngeal Swab     Status: None   Collection Time: 05/22/2019  7:36 PM   Specimen: Nasopharyngeal Swab  Result Value Ref Range Status   SARS Coronavirus 2 NEGATIVE NEGATIVE Final    Comment: (NOTE) SARS-CoV-2 target nucleic acids are NOT DETECTED. The SARS-CoV-2 RNA is generally detectable in upper and lower respiratory specimens during the acute phase of infection. Negative results do not preclude SARS-CoV-2 infection, do not rule out co-infections with other pathogens, and should not be used as the sole basis for treatment or other patient management decisions. Negative results must be combined with clinical observations, patient history, and epidemiological information. The expected result is Negative. Fact Sheet for Patients: SugarRoll.be Fact Sheet for Healthcare Providers: https://www.woods-mathews.com/ This test is not yet approved or cleared by the [place] FDA and  has been authorized for detection and/or diagnosis of SARS-CoV-2 by FDA under an Emergency Use Authorization (EUA). This EUA will remain  in effect (meaning this test can be used) for the duration of the COVID-19 declaration under Section 56 4(b)(1) of the Act, 21 U.S.C. section 360bbb-3(b)(1), unless the authorization is terminated or revoked sooner. Performed at [facility], [place] [address] [place], [place] [address]   MRSA PCR Screening      Status: None   Collection Time: 05/08/19  3:08 AM   Specimen: Nasal Mucosa; Nasopharyngeal  Result Value Ref Range Status   MRSA by PCR NEGATIVE NEGATIVE Final    Comment:        The GeneXpert MRSA Assay (FDA approved for NASAL specimens only), is one component of a comprehensive MRSA colonization surveillance program. It is not intended to diagnose MRSA infection nor to guide or monitor treatment for MRSA infections. Performed at [facility], [address] [place], [place] [address]   Respiratory Panel by PCR     Status: None   Collection Time: 05/09/19  2:05 PM   Specimen: Nasopharyngeal Swab; Respiratory  Result Value Ref Range Status   Adenovirus NOT DETECTED NOT DETECTED  Final   Coronavirus 229E NOT DETECTED NOT DETECTED Final    Comment: (NOTE) The Coronavirus on the Respiratory Panel, DOES NOT test for the novel  Coronavirus (2019 nCoV)    Coronavirus HKU1 NOT DETECTED NOT DETECTED Final   Coronavirus NL63 NOT DETECTED NOT DETECTED Final   Coronavirus OC43 NOT DETECTED NOT DETECTED Final   Metapneumovirus NOT DETECTED NOT DETECTED Final   Rhinovirus / Enterovirus NOT DETECTED NOT DETECTED Final   Influenza A NOT DETECTED NOT DETECTED Final   Influenza B NOT DETECTED NOT DETECTED Final   Parainfluenza Virus 1 NOT DETECTED NOT DETECTED Final   Parainfluenza Virus 2 NOT DETECTED NOT DETECTED Final   Parainfluenza Virus 3 NOT DETECTED NOT DETECTED Final   Parainfluenza Virus 4 NOT DETECTED NOT DETECTED Final   Respiratory Syncytial Virus NOT DETECTED NOT DETECTED Final   Bordetella pertussis NOT DETECTED NOT DETECTED Final   Chlamydophila pneumoniae NOT DETECTED NOT DETECTED Final   Mycoplasma pneumoniae NOT DETECTED NOT DETECTED Final    Comment: Performed at [facility], [place] [address], [place], [place] [address]  Expectorated sputum assessment w rflx to resp cult     Status: None   Collection Time: 05/09/19  5:30 PM   Specimen: Expectorated Sputum  Result  Value Ref Range Status   Specimen Description EXPECTORATED SPUTUM  Final   Special Requests Immunocompromised  Final   Sputum evaluation   Final    THIS SPECIMEN IS ACCEPTABLE FOR SPUTUM CULTURE Performed at [facility], [address], [place], [place] [address]    Report Status 05/09/2019 FINAL  Final  Culture, respiratory     Status: None   Collection Time: 05/09/19  5:30 PM  Result Value Ref Range Status   Specimen Description   Final    EXPECTORATED SPUTUM Performed at [facility], [address], [place], [place] [address]    Special Requests   Final    Immunocompromised Reflexed from [id number] Performed at [facility], [address], [place], [place] [address]    Gram Stain   Final    RARE WBC PRESENT, PREDOMINANTLY PMN RARE GRAM POSITIVE COCCI IN PAIRS RARE BUDDING YEAST SEEN FEW SQUAMOUS EPITHELIAL CELLS PRESENT    Culture   Final    FEW Consistent with normal respiratory flora. Performed at [facility], [place] [address], [place],  [address]    Report Status 05/12/2019 FINAL  Final         Radiology Studies: DG Abd 1 View  Result Date: 05/14/2019 CLINICAL DATA:  Abdominal distension. EXAM: ABDOMEN - 1 VIEW COMPARISON:  May 13, 2019. FINDINGS: Mildly dilated small bowel loops are noted concerning for ileus or possibly distal small bowel obstruction. Nasogastric tube tip is seen in proximal stomach. No colonic dilatation is noted. No radio-opaque calculi or other significant radiographic abnormality are seen. IMPRESSION: Nasogastric tube tip seen in proximal stomach. Mildly dilated small bowel loops are noted concerning for ileus or possibly distal small bowel obstruction. Electronically Signed   By: Marijo Conception M.D.   On: 05/14/2019 14:45   DG Abd 1 View  Result Date: 05/13/2019 CLINICAL DATA:  OG tube placement EXAM: ABDOMEN - 1 VIEW COMPARISON:  05/13/2019 FINDINGS: OG tube tip is in the stomach. Dilated small bowel loops concerning for small  bowel obstruction. Gas also seen within nondistended colon. IMPRESSION: NG tube tip in the stomach. Concern for small bowel obstruction. Electronically Signed   By: Rolm Baptise M.D.  On: 05/13/2019 18:10   DG Chest Port 1 View  Result Date: 06/02/19 CLINICAL DATA:  ICU, intubated, acute respiratory failure with hypoxemia EXAM: PORTABLE CHEST 1 VIEW COMPARISON:  Radiograph 05/12/2019 FINDINGS: *Endotracheal tube terminates in the mid trachea 5 cm from the carina *Transesophageal tube tip and side port distal to the GE junction, terminating within the air distended gastric lumen. *Right upper extremity PICC terminates in the lower SVC. *Telemetry leads overlie the chest. Extensive bilateral pulmonary opacities are similar to prior counting for differences in technique. No pneumothorax or effusion. Cardiomediastinal contours are unchanged. No acute osseous or soft tissue abnormality. IMPRESSION: Lines and tubes as above. Persistent bilateral airspace disease. No significant interval change. Electronically Signed   By: Lovena Le M.D.   On: Jun 02, 2019 05:54        Scheduled Meds: . LORazepam  2 mg Intravenous Q4H  . mouth rinse  15 mL Mouth Rinse 10 times per day  . pantoprazole (PROTONIX) IV  40 mg Intravenous Q24H   Continuous Infusions: . morphine 2 mg/hr (Jun 02, 2019 1443)     LOS: 8 days    Time spent: 30 minutes    Dilana Mcphie Darleen Crocker, DO [facility] Pager [phone]  If 7PM-7AM, please contact night-coverage [email or web address] Password [facility] 06/02/2019, 3:36 PM

## 2019-06-01 DEATH — deceased

## 2019-06-02 ENCOUNTER — Ambulatory Visit: Payer: Medicare Other | Admitting: Physician Assistant

## 2019-09-18 ENCOUNTER — Ambulatory Visit: Payer: Medicare Other | Admitting: Cardiology
